# Patient Record
Sex: Female | Born: 1955 | Race: White | Hispanic: No | Smoking: Current every day smoker
Health system: Southern US, Community
[De-identification: ages and names within clinical notes are randomized; demographics above are authoritative.]

## PROBLEM LIST (undated history)

## (undated) DIAGNOSIS — Z1589 Genetic susceptibility to other disease: Secondary | ICD-10-CM

## (undated) DIAGNOSIS — K219 Gastro-esophageal reflux disease without esophagitis: Secondary | ICD-10-CM

## (undated) DIAGNOSIS — Z789 Other specified health status: Secondary | ICD-10-CM

## (undated) DIAGNOSIS — M1990 Unspecified osteoarthritis, unspecified site: Secondary | ICD-10-CM

## (undated) DIAGNOSIS — Z0282 Encounter for adoption services: Secondary | ICD-10-CM

## (undated) DIAGNOSIS — Z9221 Personal history of antineoplastic chemotherapy: Secondary | ICD-10-CM

## (undated) DIAGNOSIS — Z923 Personal history of irradiation: Secondary | ICD-10-CM

## (undated) DIAGNOSIS — Z1509 Genetic susceptibility to other malignant neoplasm: Secondary | ICD-10-CM

## (undated) DIAGNOSIS — Z1379 Encounter for other screening for genetic and chromosomal anomalies: Principal | ICD-10-CM

## (undated) DIAGNOSIS — C801 Malignant (primary) neoplasm, unspecified: Secondary | ICD-10-CM

## (undated) HISTORY — DX: Genetic susceptibility to other malignant neoplasm: Z15.09

## (undated) HISTORY — DX: Gastro-esophageal reflux disease without esophagitis: K21.9

## (undated) HISTORY — DX: Encounter for other screening for genetic and chromosomal anomalies: Z13.79

## (undated) HISTORY — DX: Encounter for adoption services: Z02.82

## (undated) HISTORY — PX: TUBAL LIGATION: SHX77

## (undated) HISTORY — DX: Other specified health status: Z78.9

## (undated) HISTORY — DX: Unspecified osteoarthritis, unspecified site: M19.90

## (undated) HISTORY — PX: TONSILLECTOMY: SUR1361

## (undated) HISTORY — DX: Genetic susceptibility to other disease: Z15.89

---

## 2001-09-22 ENCOUNTER — Emergency Department (HOSPITAL_COMMUNITY): Admission: EM | Admit: 2001-09-22 | Discharge: 2001-09-22 | Payer: Self-pay | Admitting: Emergency Medicine

## 2001-10-02 ENCOUNTER — Encounter: Admission: RE | Admit: 2001-10-02 | Discharge: 2001-10-02 | Payer: Self-pay | Admitting: Gastroenterology

## 2001-10-02 ENCOUNTER — Encounter: Payer: Self-pay | Admitting: Gastroenterology

## 2012-05-23 ENCOUNTER — Ambulatory Visit: Payer: Self-pay

## 2012-05-23 ENCOUNTER — Ambulatory Visit: Payer: Self-pay | Admitting: Family Medicine

## 2012-05-23 VITALS — BP 167/99 | HR 79 | Temp 98.0°F | Resp 18

## 2012-05-23 DIAGNOSIS — M79609 Pain in unspecified limb: Secondary | ICD-10-CM

## 2012-05-23 DIAGNOSIS — R05 Cough: Secondary | ICD-10-CM

## 2012-05-23 DIAGNOSIS — M79672 Pain in left foot: Secondary | ICD-10-CM

## 2012-05-23 DIAGNOSIS — M76829 Posterior tibial tendinitis, unspecified leg: Secondary | ICD-10-CM

## 2012-05-23 DIAGNOSIS — H9209 Otalgia, unspecified ear: Secondary | ICD-10-CM

## 2012-05-23 DIAGNOSIS — R030 Elevated blood-pressure reading, without diagnosis of hypertension: Secondary | ICD-10-CM

## 2012-05-23 DIAGNOSIS — J069 Acute upper respiratory infection, unspecified: Secondary | ICD-10-CM

## 2012-05-23 MED ORDER — AZITHROMYCIN 250 MG PO TABS: ORAL_TABLET | ORAL | Status: DC

## 2012-05-23 NOTE — Progress Notes (Signed)
Subjective:    Patient ID: Darlene Beasley, female    DOB: 1955/09/18, 55 y.o.   MRN: [id number]  HPI Darlene Beasley is a 56 y.o. female  Cold symptoms, with cough and congestion, green phlegm and nasal congestion, sore in R ear past 6 days since pressure of blowing nose.  Low grade fever in the morning only. Tx: mucinex DM, otc tylenol.    Swelling on top of L foot, NKI.  Swelling up and down for 2-3 months. 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 No pain meds for this. No history of stress fracture, or broken bones.  Hx of Rheumatoid arthritis - no recent meds.  Occasional pain in thumb.    No primary care provider.  Told blood pressure high at doctor or dentist in past, has lost 40 pounds since March with diet and activity.   Review of Systems  Constitutional: Positive for fever (low grade).  HENT: Positive for congestion.   Respiratory: Positive for cough. Negative for wheezing.   Musculoskeletal: Positive for arthralgias.  Psychiatric/Behavioral: Negative for sleep disturbance.  and as above.      Objective:   Physical Exam  Constitutional: She is oriented to person, place, and time. She appears well-developed and well-nourished. No distress.  HENT:  Head: Normocephalic and atraumatic.  Right Ear: Hearing, tympanic membrane, external ear and ear canal normal.  Left Ear: Hearing, tympanic membrane, external ear and ear canal normal.  Nose: Nose normal.  Mouth/Throat: Oropharynx is clear and moist. No oropharyngeal exudate.  Eyes: Conjunctivae normal and EOM are normal. Pupils are equal, round, and reactive to light.  Cardiovascular: Normal rate, regular rhythm, normal heart sounds and intact distal pulses.   No murmur heard. Pulmonary/Chest: Effort normal and breath sounds normal. No respiratory distress. She has no wheezes. She has no rhonchi.  Musculoskeletal:       Left foot:  She exhibits tenderness. She exhibits normal range of motion.       Feet:  Neurological: She is alert and oriented to person, place, and time.  Skin: Skin is warm and dry. No rash noted.  Psychiatric: She has a normal mood and affect. Her behavior is normal.     [facility] reading (PRIMARY) by  Dr. Neva Seat: L foot: NAD, no acute bony findings.        Assessment & Plan:   Darlene Beasley is a 56 y.o. female 1. Left foot pain  DG Foot Complete Left  2. Cough  azithromycin (ZITHROMAX) 250 MG tablet  3. URI (upper respiratory infection)    4. Otalgia    5. Elevated blood pressure    6. Tendinitis, tibialis     Cough, URI - possible early sinobronchitis. Continue mucinex DM, add saline NS, Zpak as above - can wait 1-2 days to fill if needed.   R otalgia - ETD with URi likely.  Saline nasal spray, tylenol otc prn, 2 days only of afrin if needed. rtc precautions.   L foot pain - Tibilais anterior tendonitis likely. discusssed overuse component to this - relative rest as able. strecthes BID, ice topically prn, otc alleve or ibuprofen as needed - only if BP lower outside of office - recheck in next 2 weeks. Discussed unlikely stress injury, but need for follow up reinforced.   Patient Instructions  You can continue the mucinex DM for your cough, add saline nasal spray -  4-5 times per day, and if needed for pressure and ear pain - Afrin nasal spray - up to 2 days use only. Take antibiotic as instructed.  Return to the clinic or go to the nearest emergency room if any of your symptoms worsen or new symptoms occur.  Keep a record of your blood pressures outside of the office and bring them to the next office visit. As long as they are running under 140/90 - you can take alleve or ibuprofen over the counter as needed for your foot pain (likely form Tibialis tendinitis).   Continue icing to top of foot, stretches throughout the day, and rest as able.  Recheck foot in the next 2 weeks - sooner if worse.

## 2012-05-23 NOTE — Patient Instructions (Signed)
You can continue the mucinex DM for your cough, add saline nasal spray - 4-5 times per day, and if needed for pressure and ear pain - Afrin nasal spray - up to 2 days use only. Take antibiotic as instructed.  Return to the clinic or go to the nearest emergency room if any of your symptoms worsen or new symptoms occur.  Keep a record of your blood pressures outside of the office and bring them to the next office visit. As long as they are running under 140/90 - you can take alleve or ibuprofen over the counter as needed for your foot pain (likely form Tibialis tendinitis).   Continue icing to top of foot, stretches throughout the day, and rest as able.  Recheck foot in the next 2 weeks - sooner if worse.

## 2012-06-22 ENCOUNTER — Ambulatory Visit: Payer: Self-pay | Admitting: Family Medicine

## 2013-06-04 ENCOUNTER — Ambulatory Visit: Payer: Self-pay | Admitting: Family Medicine

## 2013-06-04 VITALS — BP 120/76 | HR 76 | Temp 99.0°F | Resp 16 | Ht 63.5 in | Wt 198.0 lb

## 2013-06-04 DIAGNOSIS — R110 Nausea: Secondary | ICD-10-CM

## 2013-06-04 DIAGNOSIS — R109 Unspecified abdominal pain: Secondary | ICD-10-CM

## 2013-06-04 DIAGNOSIS — K529 Noninfective gastroenteritis and colitis, unspecified: Secondary | ICD-10-CM

## 2013-06-04 DIAGNOSIS — R1012 Left upper quadrant pain: Secondary | ICD-10-CM

## 2013-06-04 DIAGNOSIS — R6889 Other general symptoms and signs: Secondary | ICD-10-CM

## 2013-06-04 DIAGNOSIS — K5289 Other specified noninfective gastroenteritis and colitis: Secondary | ICD-10-CM

## 2013-06-04 DIAGNOSIS — E860 Dehydration: Secondary | ICD-10-CM

## 2013-06-04 LAB — POCT URINALYSIS DIPSTICK
Glucose, UA: NEGATIVE
Ketones, UA: 15
Leukocytes, UA: NEGATIVE
Nitrite, UA: NEGATIVE
Protein, UA: 100
Spec Grav, UA: >=1.03
Urobilinogen, UA: 0.2

## 2013-06-04 LAB — POCT CBC
Granulocyte percent: 70.6 %G (ref 37–80)
Hemoglobin: 15.8 g/dL (ref 12.2–16.2)
Lymph, poc: 1.6 (ref 0.6–3.4)
MCH, POC: 30.3 pg (ref 27–31.2)
MCV: 97 fL (ref 80–97)
MPV: 9.9 fL (ref 0–99.8)
POC Granulocyte: 5.2 (ref 2–6.9)
POC LYMPH PERCENT: 21.4 %L (ref 10–50)
POC MID %: 8 %M (ref 0–12)
Platelet Count, POC: 209 10*3/uL (ref 142–424)
RBC: 5.21 M/uL (ref 4.04–5.48)
RDW, POC: 14.1 %
WBC: 7.3 10*3/uL (ref 4.6–10.2)

## 2013-06-04 LAB — IFOBT (OCCULT BLOOD): IFOBT: NEGATIVE

## 2013-06-04 MED ORDER — ONDANSETRON 4 MG PO TBDP
8.0000 mg | ORAL_TABLET | Freq: Once | ORAL | Status: AC
  Administered 2013-06-04: 8 mg via ORAL

## 2013-06-04 MED ORDER — PROMETHAZINE HCL 25 MG PO TABS: 25.0000 mg | ORAL_TABLET | Freq: Three times a day (TID) | ORAL | Status: DC | PRN

## 2013-06-04 NOTE — Patient Instructions (Signed)
Dehydration, Adult Dehydration is when you lose more fluids from the body than you take in. Vital organs like the kidneys, brain, and heart cannot function without a proper amount of fluids and salt. Any loss of fluids from the body can cause dehydration.  CAUSES   Vomiting.  Diarrhea.  Excessive sweating.  Excessive urine output.  Fever. SYMPTOMS  Mild dehydration  Thirst.  Dry lips.  Slightly dry mouth. Moderate dehydration  Very dry mouth.  Sunken eyes.  Skin does not bounce back quickly when lightly pinched and released.  Dark urine and decreased urine production.  Decreased tear production.  Headache. Severe dehydration  Very dry mouth.  Extreme thirst.  Rapid, weak pulse (more than 100 beats per minute at rest).  Cold hands and feet.  Not able to sweat in spite of heat and temperature.  Rapid breathing.  Blue lips.  Confusion and lethargy.  Difficulty being awakened.  Minimal urine production.  No tears. DIAGNOSIS  Your caregiver will diagnose dehydration based on your symptoms and your exam. Blood and urine tests will help confirm the diagnosis. The diagnostic evaluation should also identify the cause of dehydration. TREATMENT  Treatment of mild or moderate dehydration can often be done at home by increasing the amount of fluids that you drink. It is best to drink small amounts of fluid more often. Drinking too much at one time can make vomiting worse. Refer to the home care instructions below. Severe dehydration needs to be treated at the hospital where you will probably be given intravenous (IV) fluids that contain water and electrolytes. HOME CARE INSTRUCTIONS   Ask your caregiver about specific rehydration instructions.  Drink enough fluids to keep your urine clear or pale yellow.  Drink small amounts frequently if you have nausea and vomiting.  Eat as you normally do.  Avoid:  Foods or drinks high in sugar.  Carbonated  drinks.  Juice.  Extremely hot or cold fluids.  Drinks with caffeine.  Fatty, greasy foods.  Alcohol.  Tobacco.  Overeating.  Gelatin desserts.  Wash your hands well to avoid spreading bacteria and viruses.  Only take over-the-counter or prescription medicines for pain, discomfort, or fever as directed by your caregiver.  Ask your caregiver if you should continue all prescribed and over-the-counter medicines.  Keep all follow-up appointments with your caregiver. SEEK MEDICAL CARE IF:  You have abdominal pain and it increases or stays in one area (localizes).  You have a rash, stiff neck, or severe headache.  You are irritable, sleepy, or difficult to awaken.  You are weak, dizzy, or extremely thirsty. SEEK IMMEDIATE MEDICAL CARE IF:   You are unable to keep fluids down or you get worse despite treatment.  You have frequent episodes of vomiting or diarrhea.  You have blood or green matter (bile) in your vomit.  You have blood in your stool or your stool looks black and tarry.  You have not urinated in 6 to 8 hours, or you have only urinated a small amount of very dark urine.  You have a fever.  You faint. MAKE SURE YOU:   Understand these instructions.  Will watch your condition.  Will get help right away if you are not doing well or get worse. Document Released: 05/27/2005 Document Revised: 08/19/2011 Document Reviewed: 01/14/2011 [employer] Patient Information 2014 [employer].  

## 2013-06-04 NOTE — Progress Notes (Signed)
Subjective:    Patient ID: Darlene Beasley, female    DOB: April 07, 1956, 57 y.o.   MRN: [id number] Chief Complaint  Patient presents with  . 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 Unsure if exposed to anyone with flu.  Chills started Sunday and felt like "blah" until weds., had high fever and states that it broke today at 100.6. Has not had headache. Vomited last night, and has loss of appetite due to smell and taste of food. Has increased fluids today, more so then she has in the past 3 days . Has some abdominal pain last night; which patient believes was from lack of food. Is having normal output and bowel movements. Productive cough with clear phlegm; notices more when laying down. Is having clear drainnage from nose. Does not currently feel nauseated, has some chicken broth today.  Took mucinex and several tylenol over the past 24 hours .  Past Medical History  Diagnosis Date  . Arthritis   . GERD (gastroesophageal reflux disease)    No current outpatient prescriptions on file prior to visit.   No current facility-administered medications on file prior to visit.   No Known Allergies   Review of Systems  Constitutional: Positive for fever, chills, diaphoresis, activity change, appetite change and fatigue.  HENT: Positive for congestion, postnasal drip and rhinorrhea. Negative for sinus pressure.   Respiratory: Positive for cough. Negative for shortness of breath and wheezing.   Cardiovascular: Negative for chest pain.  Gastrointestinal: Positive for nausea, vomiting and abdominal pain. Negative for diarrhea and constipation.  Genitourinary: Negative for frequency and decreased urine volume.  Neurological: Negative for headaches.  Hematological: Negative for adenopathy.  Psychiatric/Behavioral: Positive for sleep disturbance.      BP 120/76  Pulse 76   Temp(Src) 99 F (37.2 C) (Oral)  Resp 16  Ht 5' 3.5" (1.613 m)  Wt 198 lb (89.812 kg)  BMI 34.52 kg/m2  SpO2 98%  Objective:   Physical Exam  Constitutional: She is oriented to person, place, and time. She appears well-developed and well-nourished. No distress.  HENT:  Head: Normocephalic and atraumatic.  Right Ear: Tympanic membrane, external ear and ear canal normal.  Left Ear: Tympanic membrane, external ear and ear canal normal.  Nose: Nose normal. No mucosal edema or rhinorrhea.  Mouth/Throat: Uvula is midline, oropharynx is clear and moist and mucous membranes are normal. No oropharyngeal exudate.  Eyes: Conjunctivae are normal. Right eye exhibits no discharge. Left eye exhibits no discharge. No scleral icterus.  Neck: Normal range of motion. Neck supple.  Cardiovascular: Normal rate, regular rhythm, normal heart sounds and intact distal pulses.   Pulmonary/Chest: Effort normal and breath sounds normal.  Abdominal: Soft. Bowel sounds are normal. She exhibits no distension and no mass. There is no tenderness. There is no rebound and no guarding.  Genitourinary: Rectum normal. Rectal exam shows no mass and anal tone normal. Guaiac negative stool.  Lymphadenopathy:    She has no cervical adenopathy.  Neurological: She is alert and oriented to person, place, and time.  Skin: Skin is warm and dry. She is not diaphoretic. No erythema.  Psychiatric: She has a normal mood and affect. Her behavior is normal.      Results for orders placed in visit on 06/04/13  POCT CBC      Result Value Range  WBC 7.3  4.6 - 10.2 K/uL   Lymph, poc 1.6  0.6 - 3.4   POC LYMPH PERCENT 21.4  10 - 50 %L   MID (cbc) 0.6  0 - 0.9   POC MID % 8.0  0 - 12 %M   POC Granulocyte 5.2  2 - 6.9   Granulocyte percent 70.6  37 - 80 %G   RBC 5.21  4.04 - 5.48 M/uL   Hemoglobin 15.8  12.2 - 16.2 g/dL   HCT, POC 78.2 (*) 95.6 - 47.9 %   MCV 97.0  80 - 97 fL   MCH, POC 30.3  27 - 31.2 pg   MCHC 31.3 (*) 31.8 -  35.4 g/dL   RDW, POC 21.3     Platelet Count, POC 209  142 - 424 K/uL   MPV 9.9  0 - 99.8 fL  POCT URINALYSIS DIPSTICK      Result Value Range   Color, UA yellow     Clarity, UA clear     Glucose, UA neg     Bilirubin, UA small     Ketones, UA 15     Spec Grav, UA >=1.030     Blood, UA trace-lysed     pH, UA 5.5     Protein, UA 100     Urobilinogen, UA 0.2     Nitrite, UA neg     Leukocytes, UA Negative    IFOBT (OCCULT BLOOD)      Result Value Range   IFOBT Negative     Assessment & Plan:   Abdominal pain - Plan: POCT CBC, POCT urinalysis dipstick, IFOBT POC (occult bld, rslt in office), ondansetron (ZOFRAN-ODT) disintegrating tablet 8 mg, CANCELED: POCT UA - Microscopic Only  Nausea - Plan: ondansetron (ZOFRAN-ODT) disintegrating tablet 8 mg  Noninfectious gastroenteritis and colitis  Flu-like symptoms  Dehydration  Viral syndrome due to diffuse systemic invovlement and nml CBC.  Highly suspect flu but will not test as sxs began a week ago so WELL outside of treatment range and unlikely to even be shedding much virus still so neg test not helpful.  I think she will recover on her own - is already starting to improve some - just needs to continue symptomatic care and make sure she doesn't get dehydrated - by her UA she is already dehydrated which is prob making sxs much worse. Push fluids, oow, self-quarentine, hand hygiene. RTC if worsening. Meds ordered this encounter  Medications  . ondansetron (ZOFRAN-ODT) disintegrating tablet 8 mg    Sig:   . promethazine (PHENERGAN) 25 MG tablet    Sig: Take 1 tablet (25 mg total) by mouth every 8 (eight) hours as needed for nausea or vomiting.    Dispense:  20 tablet    Refill:  0   Norberto Sorenson, MD MPH

## 2013-06-05 ENCOUNTER — Telehealth: Payer: Self-pay

## 2013-06-05 NOTE — Telephone Encounter (Signed)
Spoke to pt. She is still feeling very ill. Her fever has come back and remains around 100.0. She wants a note until Jan 2. Spoke to Dr. Clelia Croft she has agreed. Letter at front desk for pt to pick up

## 2013-06-05 NOTE — Telephone Encounter (Signed)
PATIENT IS CALLING TO SPEAK TO DR SHAW ABOUT GETTING AN EXTENTION ON HER WORK NOTE IF POSSIBLE PLEASE CALL [phone]

## 2013-06-13 ENCOUNTER — Ambulatory Visit: Payer: Self-pay | Admitting: Emergency Medicine

## 2013-06-13 ENCOUNTER — Ambulatory Visit: Payer: Self-pay

## 2013-06-13 VITALS — BP 130/80 | HR 85 | Temp 99.3°F | Resp 16 | Ht 65.0 in | Wt 194.0 lb

## 2013-06-13 DIAGNOSIS — R509 Fever, unspecified: Secondary | ICD-10-CM

## 2013-06-13 DIAGNOSIS — R05 Cough: Secondary | ICD-10-CM

## 2013-06-13 DIAGNOSIS — R059 Cough, unspecified: Secondary | ICD-10-CM

## 2013-06-13 DIAGNOSIS — J189 Pneumonia, unspecified organism: Secondary | ICD-10-CM

## 2013-06-13 LAB — POCT CBC
Granulocyte percent: 76 %G (ref 37–80)
HCT, POC: 46 % (ref 37.7–47.9)
Hemoglobin: 14.7 g/dL (ref 12.2–16.2)
Lymph, poc: 2.8 (ref 0.6–3.4)
MCH, POC: 30.3 pg (ref 27–31.2)
MCHC: 32 g/dL (ref 31.8–35.4)
MCV: 94.8 fL (ref 80–97)
MID (cbc): 0.9 (ref 0–0.9)
MPV: 10 fL (ref 0–99.8)
POC Granulocyte: 11.7 — AB (ref 2–6.9)
POC LYMPH PERCENT: 18.1 %L (ref 10–50)
POC MID %: 5.9 %M (ref 0–12)
Platelet Count, POC: 309 10*3/uL (ref 142–424)
RBC: 4.85 M/uL (ref 4.04–5.48)
RDW, POC: 13.4 %
WBC: 15.4 10*3/uL — AB (ref 4.6–10.2)

## 2013-06-13 MED ORDER — CEFTRIAXONE SODIUM 1 G IJ SOLR
1.0000 g | Freq: Once | INTRAMUSCULAR | Status: AC
  Administered 2013-06-13: 1 g via INTRAMUSCULAR

## 2013-06-13 MED ORDER — LEVOFLOXACIN 500 MG PO TABS
500.0000 mg | ORAL_TABLET | Freq: Every day | ORAL | Status: DC
Start: 2013-06-13 — End: 2013-09-08

## 2013-06-13 NOTE — Progress Notes (Signed)
   Subjective:    Patient ID: Darlene Beasley, female    DOB: December 17, 1955, 58 y.o.   MRN: [id number]  HPI 58 year old female presents for recheck of fever, cough, and shortness of breath. She was seen here on 12/26 and diagnosed with probably flu and gastroenteritis.  Was prescribed phenergan to take for nausea. States her symptoms did improve and her fever broke the following week, however on 1/2 her fever returned and has continued to be 99-100.0.    Review of Systems  Constitutional: Positive for fever, chills and fatigue.  HENT: Negative for congestion, ear pain and sore throat.   Respiratory: Positive for cough (productive) and shortness of breath. Negative for chest tightness and wheezing.   Gastrointestinal: Negative for nausea, vomiting and abdominal pain.  Neurological: Negative for dizziness and headaches.       Objective:   Physical Exam  Constitutional: She is oriented to person, place, and time. She appears well-developed and well-nourished.  HENT:  Head: Normocephalic and atraumatic.  Right Ear: Hearing, tympanic membrane, external ear and ear canal normal.  Left Ear: Hearing, tympanic membrane, external ear and ear canal normal.  Mouth/Throat: Uvula is midline, oropharynx is clear and moist and mucous membranes are normal. No oropharyngeal exudate.  Eyes: Conjunctivae are normal.  Neck: Normal range of motion. Neck supple.  Cardiovascular: Normal rate, regular rhythm and normal heart sounds.   Pulmonary/Chest: Effort normal. She has no wheezes. She has rales (left lower fields).  Lymphadenopathy:    She has no cervical adenopathy.  Neurological: She is alert and oriented to person, place, and time.  Psychiatric: She has a normal mood and affect. Her behavior is normal. Judgment and thought content normal.     [facility] reading (PRIMARY) by  Dr. Everlene Farrier as increased markings on right and left lower lobe infiltrate seen on lateral view.       Assessment & Plan:    Pneumonia, organism unspecified - Plan: cefTRIAXone (ROCEPHIN) injection 1 g, levofloxacin (LEVAQUIN) 500 MG tablet  Cough - Plan: DG Chest 2 View, POCT CBC  Fever, unspecified - Plan: DG Chest 2 View  Rocephin 1 gram given today Start Levaquin 500 mg daily x 7 days Out of work today.  Recheck in 48-72 hours if symptoms not significantly improved, sooner if worse

## 2013-09-08 ENCOUNTER — Ambulatory Visit: Payer: Self-pay

## 2013-09-08 ENCOUNTER — Ambulatory Visit: Payer: Self-pay | Admitting: Family Medicine

## 2013-09-08 VITALS — BP 158/102 | HR 88 | Temp 98.5°F | Resp 16 | Ht 64.5 in | Wt 201.0 lb

## 2013-09-08 DIAGNOSIS — R059 Cough, unspecified: Secondary | ICD-10-CM

## 2013-09-08 DIAGNOSIS — R05 Cough: Secondary | ICD-10-CM

## 2013-09-08 DIAGNOSIS — J209 Acute bronchitis, unspecified: Secondary | ICD-10-CM

## 2013-09-08 DIAGNOSIS — R509 Fever, unspecified: Secondary | ICD-10-CM

## 2013-09-08 LAB — POCT CBC
GRANULOCYTE PERCENT: 56.4 % (ref 37–80)
HCT, POC: 45.8 % (ref 37.7–47.9)
HEMOGLOBIN: 14.6 g/dL (ref 12.2–16.2)
Lymph, poc: 1.7 (ref 0.6–3.4)
MCH: 30 pg (ref 27–31.2)
MCHC: 31.9 g/dL (ref 31.8–35.4)
MCV: 94.2 fL (ref 80–97)
MID (CBC): 0.4 (ref 0–0.9)
MPV: 8.9 fL (ref 0–99.8)
PLATELET COUNT, POC: 237 10*3/uL (ref 142–424)
POC Granulocyte: 2.8 (ref 2–6.9)
POC LYMPH PERCENT: 34.5 %L (ref 10–50)
POC MID %: 9.1 %M (ref 0–12)
RBC: 4.86 M/uL (ref 4.04–5.48)
RDW, POC: 14.8 %
WBC: 4.9 10*3/uL (ref 4.6–10.2)

## 2013-09-08 MED ORDER — AZITHROMYCIN 250 MG PO TABS: ORAL_TABLET | ORAL | Status: DC

## 2013-09-08 NOTE — Progress Notes (Addendum)
Subjective:  This chart was scribed for [name], MD by Ludger Nutting, ED Scribe. This patient was seen in room 14 and the patient's care was started 4:50 PM.    Patient ID: Darlene Beasley, female    DOB: [birth year], 58 y.o.   MRN: [id number]  HPI HPI Comments: Darlene Beasley is a 59 y.o. female who presents to [facility] complaining of 1 day of gradual onset, constant HA that progressed to associated chills, subjective mild fever, and non-productive cough. She reports having associated eye watering and rhinorrhea after severe coughing. She states her PO intake has been normal. She has taken Mucinex with minimal relief. She denies sore throat, otalgia, emesis, diarrhea. She reports history of flu in December 2014 and pneumonia in January 2015.  She is a regular smoker since age 89.   Past Medical History  Diagnosis Date  . Arthritis   . GERD (gastroesophageal reflux disease)     Past Surgical History  Procedure Laterality Date  . Tubal ligation       No Known Allergies No current outpatient prescriptions on file prior to visit.   No current facility-administered medications on file prior to visit.   History   Social History  . Marital Status: Divorced    Spouse Name: N/A    Number of Children: N/A  . Years of Education: N/A   Occupational History  . Not on file.   Social History Main Topics  . Smoking status: Current Every Day Smoker -- 0.50 packs/day    Types: Cigarettes  . Smokeless tobacco: Never Used  . Alcohol Use: No  . Drug Use: No  . Sexual Activity: Not on file   Other Topics Concern  . Not on file   Social History Narrative  . No narrative on file    Review of Systems  Constitutional: Positive for chills. Fever: subjective.  HENT: Positive for rhinorrhea. Negative for congestion, ear pain, postnasal drip, sore throat, trouble swallowing and voice change.   Respiratory: Positive for cough. Negative for shortness of breath and wheezing.     Gastrointestinal: Negative for vomiting, abdominal pain and diarrhea.  Skin: Negative for rash.  Neurological: Negative for headaches.       Objective:   Physical Exam  Nursing note and vitals reviewed. Constitutional: She is oriented to person, place, and time. She appears well-developed and well-nourished. No distress.  HENT:  Head: Normocephalic and atraumatic.  Right Ear: Tympanic membrane, external ear and ear canal normal.  Left Ear: Tympanic membrane, external ear and ear canal normal.  Nose: Nose normal.  Mouth/Throat: Oropharynx is clear and moist. No oropharyngeal exudate, posterior oropharyngeal edema, posterior oropharyngeal erythema or tonsillar abscesses.  Eyes: Conjunctivae are normal. Pupils are equal, round, and reactive to light.  Neck: Normal range of motion. Neck supple.  Cardiovascular: Normal rate, regular rhythm and normal heart sounds.   No murmur heard. Pulmonary/Chest: Effort normal and breath sounds normal. No respiratory distress. She has no wheezes. She has no rhonchi. She has no rales.  Abdominal: She exhibits no distension.  Lymphadenopathy:    She has no cervical adenopathy.  Neurological: She is alert and oriented to person, place, and time.  Skin: Skin is warm and dry. She is not diaphoretic.  Psychiatric: She has a normal mood and affect. Her behavior is normal.    Filed Vitals:   09/08/13 1600  BP: 158/102  Pulse: 88  Temp: 98.5 F (36.9 C)  TempSrc: Oral  Resp: 16  Height: 5' 4.5" (1.638 m)  Weight: 201 lb (91.173 kg)  SpO2: 94%   Results for orders placed in visit on 09/08/13  POCT CBC      Result Value Ref Range   WBC 4.9  4.6 - 10.2 K/uL   Lymph, poc 1.7  0.6 - 3.4   POC LYMPH PERCENT 34.5  10 - 50 %L   MID (cbc) 0.4  0 - 0.9   POC MID % 9.1  0 - 12 %M   POC Granulocyte 2.8  2 - 6.9   Granulocyte percent 56.4  37 - 80 %G   RBC 4.86  4.04 - 5.48 M/uL   Hemoglobin 14.6  12.2 - 16.2 g/dL   HCT, POC 45.8  37.7 - 47.9 %   MCV  94.2  80 - 97 fL   MCH, POC 30.0  27 - 31.2 pg   MCHC 31.9  31.8 - 35.4 g/dL   RDW, POC 14.8     Platelet Count, POC 237  142 - 424 K/uL   MPV 8.9  0 - 99.8 fL   [facility] reading (PRIMARY) by  Dr. Tamala Julian.  CXR: stable scarring in LLL.      Assessment & Plan:   1. Cough   2. Fever   3. Acute bronchitis    1. Acute bronchitis:  New. Rx for Zpack provided; continue Mucinex, ASA.  RTC for acute worsening.  Meds ordered this encounter  Medications  . azithromycin (ZITHROMAX) 250 MG tablet    Sig: Two tablets daily x 1 day then one tablet daily x 4 days    Dispense:  6 tablet    Refill:  0    I personally performed the services described in this documentation, which was scribed in my presence.  The recorded information has been reviewed and is accurate.  Reginia Forts, M.D.  [facility] [address] [place], [place]  [address] [phone] phone [phone] fax

## 2013-09-08 NOTE — Patient Instructions (Signed)
Acute Bronchitis Bronchitis is inflammation of the airways that extend from the windpipe into the lungs (bronchi). The inflammation often causes mucus to develop. This leads to a cough, which is the most common symptom of bronchitis.  In acute bronchitis, the condition usually develops suddenly and goes away over time, usually in a couple weeks. Smoking, allergies, and asthma can make bronchitis worse. Repeated episodes of bronchitis may cause further lung problems.  CAUSES Acute bronchitis is most often caused by the same virus that causes a cold. The virus can spread from person to person (contagious).  SIGNS AND SYMPTOMS   Cough.   Fever.   Coughing up mucus.   Body aches.   Chest congestion.   Chills.   Shortness of breath.   Sore throat.  DIAGNOSIS  Acute bronchitis is usually diagnosed through a physical exam. Tests, such as chest X-rays, are sometimes done to rule out other conditions.  TREATMENT  Acute bronchitis usually goes away in a couple weeks. Often times, no medical treatment is necessary. Medicines are sometimes given for relief of fever or cough. Antibiotics are usually not needed but may be prescribed in certain situations. In some cases, an inhaler may be recommended to help reduce shortness of breath and control the cough. A cool mist vaporizer may also be used to help thin bronchial secretions and make it easier to clear the chest.  HOME CARE INSTRUCTIONS  Get plenty of rest.   Drink enough fluids to keep your urine clear or pale yellow (unless you have a medical condition that requires fluid restriction). Increasing fluids may help thin your secretions and will prevent dehydration.   Only take over-the-counter or prescription medicines as directed by your health care provider.   Avoid smoking and secondhand smoke. Exposure to cigarette smoke or irritating chemicals will make bronchitis worse. If you are a smoker, consider using nicotine gum or skin  patches to help control withdrawal symptoms. Quitting smoking will help your lungs heal faster.   Reduce the chances of another bout of acute bronchitis by washing your hands frequently, avoiding people with cold symptoms, and trying not to touch your hands to your mouth, nose, or eyes.   Follow up with your health care provider as directed.  SEEK MEDICAL CARE IF: Your symptoms do not improve after 1 week of treatment.  SEEK IMMEDIATE MEDICAL CARE IF:  You develop an increased fever or chills.   You have chest pain.   You have severe shortness of breath.  You have bloody sputum.   You develop dehydration.  You develop fainting.  You develop repeated vomiting.  You develop a severe headache. MAKE SURE YOU:   Understand these instructions.  Will watch your condition.  Will get help right away if you are not doing well or get worse. Document Released: 07/04/2004 Document Revised: 01/27/2013 Document Reviewed: 11/17/2012 [employer] Patient Information 2014 [employer].  

## 2015-04-05 ENCOUNTER — Ambulatory Visit (INDEPENDENT_AMBULATORY_CARE_PROVIDER_SITE_OTHER): Payer: Self-pay | Admitting: Physician Assistant

## 2015-04-05 VITALS — BP 136/82 | HR 89 | Temp 98.4°F | Resp 18 | Ht 64.0 in | Wt 192.0 lb

## 2015-04-05 DIAGNOSIS — J019 Acute sinusitis, unspecified: Secondary | ICD-10-CM

## 2015-04-05 MED ORDER — AMOXICILLIN-POT CLAVULANATE 875-125 MG PO TABS: 1.0000 | ORAL_TABLET | Freq: Two times a day (BID) | ORAL | Status: AC

## 2015-04-05 NOTE — Patient Instructions (Signed)
Please restart the Mucinex to help thin the mucous. Get as much rest as you can, and drink as much water as you can.

## 2015-04-05 NOTE — Progress Notes (Signed)
Patient ID: Darlene Beasley, female    DOB: 1955/10/28, 59 y.o.   MRN: [id number]  PCP: No PCP Per Patient  Subjective:   Chief Complaint  Patient presents with  . Lymphadenopathy    x3 weeks   . chest congestion  . Cough  . Fatigue  . Fever    102 monday   . Sore Throat    HPI Presents for evaluation of cough, sore throat, and congestion x 3 weeks.   Cough started out as non-productive and was associated with low-grade subjective fevers at night and nasal congestion. Cough became productive about 2 weeks ago with yellow-tinged sputum and she reports a fever of 102 on Monday AM. Since that time, her fever has broken and her cough and congestion have improved, but she now complains of neck stiffness and swelling over the last few days. She tried Mucinex for 2 weeks, which helped thin out her secretions, but she stopped taking it after hearing she shouldn't take it long-term.   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 Her energy levels are ok, but she reports the last couple of days she has felt particularly tired and fatigued. She feels stressed out because she's supposed to start back at work tomorrow.  Patient not interested in completing her overdue health maintenance measures at this visit. No other concern's on today's visit.   Review of Systems Constitutional: Positive for fever, chills (at night), appetite change (eating mostly soups; too tired to eat), fatigue and unexpected weight change (has lost approx 8 pounds in the last 3 weeks).  HENT: Positive for congestion, postnasal drip, rhinorrhea and sore throat. Negative for ear discharge, ear pain, hearing loss, sinus pressure and tinnitus.  Eyes: Negative for pain, redness, itching and visual disturbance.  Respiratory: Positive for cough, chest tightness and shortness of breath (mild; has improved over the last week). Negative for wheezing.  Cardiovascular: Negative  for chest pain and palpitations.  Gastrointestinal: Positive for constipation (no BM in last 2 days, but not eating much). Negative for nausea, vomiting, abdominal pain, diarrhea and blood in stool.  Genitourinary: Negative for hematuria, decreased urine volume and difficulty urinating.  Musculoskeletal: Positive for neck stiffness. Negative for myalgias, back pain, joint swelling and arthralgias.  Skin: Negative.  Allergic/Immunologic: Negative for environmental allergies.  Neurological: Negative for dizziness, weakness, light-headedness and headaches.      There are no active problems to display for this patient.    Prior to Admission medications   Medication Sig Start Date End Date Taking? Authorizing Provider  aspirin 81 MG tablet Take 81 mg by mouth daily.   Yes Historical Provider, MD  dextromethorphan-guaiFENesin (MUCINEX DM) 30-600 MG 12hr tablet Take 1 tablet by mouth 2 (two) times daily.   Yes Historical Provider, MD     No Known Allergies     Objective:  Physical Exam  Constitutional: She is oriented to person, place, and time. Vital signs are normal. She appears well-developed and well-nourished. She is active and cooperative. No distress.  BP 136/82 mmHg  Pulse 89  Temp(Src) 98.4 F (36.9 C) (Oral)  Resp 18  Ht 5\' 4"  (1.626 m)  Wt 192 lb (87.091 kg)  BMI 32.94 kg/m2  SpO2 98%  HENT:  Head: Normocephalic and atraumatic.  Right Ear: Hearing, tympanic membrane, external ear and ear canal normal.  Left Ear: Hearing, tympanic membrane, external ear and ear canal normal.  Nose: Nose  normal. Right sinus exhibits no maxillary sinus tenderness and no frontal sinus tenderness. Left sinus exhibits no maxillary sinus tenderness and no frontal sinus tenderness.  Mouth/Throat: Uvula is midline, oropharynx is clear and moist and mucous membranes are normal.  Eyes: Conjunctivae are normal. No scleral icterus.  Neck: Normal range of motion and phonation normal. Neck supple. No  thyromegaly present.  Cardiovascular: Normal rate, regular rhythm and normal heart sounds.   Pulses:      Radial pulses are 2+ on the right side, and 2+ on the left side.  Pulmonary/Chest: Effort normal and breath sounds normal.  Lymphadenopathy:       Head (right side): No preauricular, no posterior auricular and no occipital adenopathy present.       Head (left side): No preauricular, no posterior auricular and no occipital adenopathy present.    She has no cervical adenopathy.       Right: No supraclavicular adenopathy present.       Left: No supraclavicular adenopathy present.  Bilateral fullness in the tonsillar region, tender. No discrete lymphadenopathy.  Neurological: She is alert and oriented to person, place, and time. No sensory deficit.  Skin: Skin is warm, dry and intact. No rash noted. No cyanosis or erythema. Nails show no clubbing.  Psychiatric: She has a normal mood and affect.           Assessment & Plan:   1. Subacute sinusitis, unspecified location Rest, fluids, guaifenesin. Quit smoking. RTC if symptoms worsen/persist. - amoxicillin-clavulanate (AUGMENTIN) 875-125 MG tablet; Take 1 tablet by mouth 2 (two) times daily.  Dispense: 20 tablet; Refill: 0   Fara Chute, PA-C Physician Assistant-Certified [facility]

## 2015-04-05 NOTE — Progress Notes (Signed)
Subjective:    Patient ID: Darlene Beasley, female    DOB: 11-29-1955, 59 y.o.   MRN: [id number]  Chief Complaint  Patient presents with  . Lymphadenopathy    x3 weeks   . chest congestion  . Cough  . Fatigue  . Fever    102 monday   . Sore Throat   HPI Patient presents today for evaluation of cough, sore throat, and congestion x 3 weeks. Cough started out as non-productive and was associated with low-grade subjective fevers at night and nasal congestion. Cough became productive about 2 weeks ago with yellow-tinged sputum and she reports a fever of 102 on Monday AM. Since that time, her fever has broken and her cough and congestion have improved, but she now complains of neck stiffness and swelling over the last few days. She tried Mucinex for 2 weeks, which helped thin out her secretions, but she stopped taking it after hearing she shouldn't take it long-term.   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 Her energy levels are ok, but she reports the last couple of days she has felt particularly tired and fatigued. She feels stressed out because she's supposed to start back at work tomorrow.   Patient not interested in completing her overdue health maintenance measures at this visit. No other concern's on today's visit.    Review of Systems  Constitutional: Positive for fever, chills (at night), appetite change (eating mostly soups; too tired to eat), fatigue and unexpected weight change (has lost approx 8 pounds in the last 3 weeks).  HENT: Positive for congestion, postnasal drip, rhinorrhea and sore throat. Negative for ear discharge, ear pain, hearing loss, sinus pressure and tinnitus.   Eyes: Negative for pain, redness, itching and visual disturbance.  Respiratory: Positive for cough, chest tightness and shortness of breath (mild; has improved over the last week). Negative for wheezing.   Cardiovascular: Negative for chest  pain and palpitations.  Gastrointestinal: Positive for constipation (no BM in last 2 days, but not eating much). Negative for nausea, vomiting, abdominal pain, diarrhea and blood in stool.  Genitourinary: Negative for hematuria, decreased urine volume and difficulty urinating.  Musculoskeletal: Positive for neck stiffness. Negative for myalgias, back pain, joint swelling and arthralgias.  Skin: Negative.   Allergic/Immunologic: Negative for environmental allergies.  Neurological: Negative for dizziness, weakness, light-headedness and headaches.   There are no active problems to display for this patient.  Family History  Problem Relation Age of Onset  . Congestive Heart Failure Mother    Social History   Social History  . Marital Status: Divorced    Spouse Name: N/A  . Number of Children: N/A  . Years of Education: N/A   Occupational History  . Not on file.   Social History Main Topics  . Smoking status: Current Every Day Smoker -- 0.50 packs/day    Types: Cigarettes  . Smokeless tobacco: Never Used  . Alcohol Use: No  . Drug Use: No  . Sexual Activity: Not on file   Other Topics Concern  . Not on file   Social History Narrative   Prior to Admission medications   Medication Sig Start Date End Date Taking? Authorizing Provider  aspirin 81 MG tablet Take 81 mg by mouth daily.   Yes Historical Provider, MD  dextromethorphan-guaiFENesin (MUCINEX DM) 30-600 MG 12hr tablet Take 1 tablet by mouth 2 (two) times daily.   Yes Historical Provider,  MD  amoxicillin-clavulanate (AUGMENTIN) 875-125 MG tablet Take 1 tablet by mouth 2 (two) times daily. 04/05/15 04/15/15  Chelle Jeffery, PA-C   No Known Allergies    Objective:   Physical Exam  Constitutional: She is oriented to person, place, and time. She appears well-developed and well-nourished. No distress.  BP 136/82 mmHg  Pulse 89  Temp(Src) 98.4 F (36.9 C) (Oral)  Resp 18  Ht 5\' 4"  (1.626 m)  Wt 192 lb (87.091 kg)  BMI  32.94 kg/m2  SpO2 98%  HENT:  Head: Normocephalic and atraumatic.  Eyes: EOM are normal. No scleral icterus.  Neck: No thyromegaly present.  Exhibits mild neck fullness  Cardiovascular: Normal rate, regular rhythm, normal heart sounds and intact distal pulses.  Exam reveals no gallop and no friction rub.   No murmur heard. Pulmonary/Chest: Effort normal and breath sounds normal. No respiratory distress. She has no wheezes. She has no rales.  Musculoskeletal: She exhibits no edema.  Lymphadenopathy:    She has no cervical adenopathy (anterior cervical LAD).  Neurological: She is alert and oriented to person, place, and time.  Skin: Skin is warm and dry. No rash noted. She is not diaphoretic. No erythema.  Psychiatric: She has a normal mood and affect. Her behavior is normal. Judgment and thought content normal.      Assessment & Plan:  1. Subacute sinusitis, unspecified location - Patient may return to work on Friday. Instructed patient to focus on getting plenty of rest, drinking lots of water, limiting cigarette smoke exposure, and avoiding sick contacts over the next 24-48 hours. Complete full course of antibiotic therapy. Return to clinic if symptoms worsen or do not improve in 7-10 days.   - amoxicillin-clavulanate (AUGMENTIN) 875-125 MG tablet; Take 1 tablet by mouth 2 (two) times daily.  Dispense: 20 tablet; Refill: 0

## 2017-08-19 ENCOUNTER — Other Ambulatory Visit: Payer: Self-pay

## 2017-08-19 ENCOUNTER — Encounter: Payer: Self-pay | Admitting: Family Medicine

## 2017-08-19 ENCOUNTER — Ambulatory Visit: Payer: Self-pay | Admitting: Family Medicine

## 2017-08-19 VITALS — BP 148/82 | HR 80 | Temp 98.0°F | Resp 16 | Ht 63.78 in | Wt 194.0 lb

## 2017-08-19 DIAGNOSIS — N6310 Unspecified lump in the right breast, unspecified quadrant: Secondary | ICD-10-CM

## 2017-08-19 NOTE — Progress Notes (Signed)
Subjective:    Patient ID: Darlene Beasley, female    DOB: May 09, 1956, 62 y.o.   MRN: [id number]  HPI Darlene Beasley is a 62 y.o. female Presents today for: Chief Complaint  Patient presents with  . Annual Exam  Cancer screening: Colonoscopy: has not had done.  Mammogram - last one in 30's - R breast issue below.  Cervical cancer screening: - none in 30 years.  primary reason for visit is R breast issue.  Fasting, plans on returning for physical  R breast - pinched self at work in October 2018, had bruising - improved, but still sore, and nipple started to look inverted since injury (December 2018). Does have some hardening of the area and warmness over past few months.    There are no active problems to display for this patient.  Past Medical History:  Diagnosis Date  . Arthritis   . GERD (gastroesophageal reflux disease)    Past Surgical History:  Procedure Laterality Date  . TUBAL LIGATION     No Known Allergies Prior to Admission medications   Not on File   Social History   Socioeconomic History  . Marital status: Divorced    Spouse name: Not on file  . Number of children: Not on file  . Years of education: Not on file  . Highest education level: Not on file  Social Needs  . Financial resource strain: Not on file  . Food insecurity - worry: Not on file  . Food insecurity - inability: Not on file  . Transportation needs - medical: Not on file  . Transportation needs - non-medical: Not on file  Occupational History  . Not on file  Tobacco Use  . Smoking status: Current Every Day Smoker    Packs/day: 0.50    Types: Cigarettes  . Smokeless tobacco: Never Used  Substance and Sexual Activity  . Alcohol use: No  . Drug use: No  . Sexual activity: Not on file  Other Topics Concern  . Not on file  Social History Narrative  . Not on file  ; Review of Systems  Respiratory: Positive for wheezing (off and on - hist smoking, not currently issue. ).   Skin:  Negative for color change, rash and wound.       Objective:   Physical Exam  Constitutional: She is oriented to person, place, and time. She appears well-developed and well-nourished. No distress.  HENT:  Head: Normocephalic and atraumatic.  Cardiovascular: Normal rate.  Pulmonary/Chest: Effort normal. Right breast exhibits inverted nipple, mass, skin change and tenderness. Right breast exhibits no nipple discharge.    Lymphadenopathy:    She has no axillary adenopathy (no appreciable axillary adenopathy or tenderness).  Neurological: She is alert and oriented to person, place, and time.  Psychiatric: She has a normal mood and affect.  Vitals reviewed.  Vitals:   08/19/17 1649  BP: (!) 148/82  Pulse: 80  Resp: 16  Temp: 98 F (36.7 C)  TempSrc: Oral  SpO2: 96%  Weight: 194 lb (88 kg)  Height: 5' 3.78" (1.62 m)          Assessment & Plan:  Darlene Beasley is a 62 y.o. female Breast mass, right - Plan: MM Digital Diagnostic Bilat, US BREAST COMPLETE UNI RIGHT INC AXILLA  - initially resented for physical, possibly main concern was right breast mass. Plans to return for physical, but health maintenance screening was discussed and options of referrals.Plan on fasting blood work at  next visit  -Right breast mass may be due to previous trauma and scar tissue versus cyst/seroma. However there is very firm with possible skin changes in now nipple retraction/inversion  -check diagnostic mammogram and ultrasound of right breast to determine next step  No orders of the defined types were placed in this encounter.  Patient Instructions    Will refer you for a diagnostic mammogram of the right breast and ultrasound to look at that abnormal area further.   I would recommend cervical cancer screening, colon cancer screening as we discussed. These can be discussed at a physical  - let me know when we refer you or perform those tests. See other health maintenance below that we will  discuss at your physical. Since you are not fasting, I can check blood work next time.   Keeping You Healthy  Get These Tests  Blood Pressure- Have your blood pressure checked by your healthcare provider at least once a year.  Normal blood pressure is 120/80.  Weight- Have your body mass index (BMI) calculated to screen for obesity.  BMI is a measure of body fat based on height and weight.  You can calculate your own BMI at [employer]  Cholesterol- Have your cholesterol checked every year.  Diabetes- Have your blood sugar checked every year if you have high blood pressure, high cholesterol, a family history of diabetes or if you are overweight.  Pap Test - Have a pap test every 1 to 5 years if you have been sexually active.  If you are older than 65 and recent pap tests have been normal you may not need additional pap tests.  In addition, if you have had a hysterectomy  for benign disease additional pap tests are not necessary.  Mammogram-Yearly mammograms are essential for early detection of breast cancer  Screening for Colon Cancer- Colonoscopy starting at age 69. Screening may begin sooner depending on your family history and other health conditions.  Follow up colonoscopy as directed by your Gastroenterologist.  Screening for Osteoporosis- Screening begins at age 63 with bone density scanning, sooner if you are at higher risk for developing Osteoporosis.  Get these medicines  Calcium with Vitamin D- Your body requires 1200-1500 mg of Calcium a day and [phone] IU of Vitamin D a day.  You can only absorb 500 mg of Calcium at a time therefore Calcium must be taken in 2 or 3 separate doses throughout the day.  Hormones- Hormone therapy has been associated with increased risk for certain cancers and heart disease.  Talk to your healthcare provider about if you need relief from menopausal symptoms.  Aspirin- Ask your healthcare provider about taking Aspirin to prevent Heart  Disease and Stroke.  Get these Immuniztions  Flu shot- Every fall  Pneumonia shot- Once after the age of 37; if you are younger ask your healthcare provider if you need a pneumonia shot.  Tetanus- Every ten years.  Zostavax- Once after the age of 9 to prevent shingles.  Take these steps  Don't smoke- Your healthcare provider can help you quit. For tips on how to quit, ask your healthcare provider or go to www.smokefree.gov or call 1-800 QUIT-NOW.  Be physically active- Exercise 5 days a week for a minimum of 30 minutes.  If you are not already physically active, start slow and gradually work up to 30 minutes of moderate physical activity.  Try walking, dancing, bike riding, swimming, etc.  Eat a healthy diet- Eat a variety of healthy foods  such as fruits, vegetables, whole grains, low fat milk, low fat cheeses, yogurt, lean meats, chicken, fish, eggs, dried beans, tofu, etc.  For more information go to www.thenutritionsource.org  Dental visit- Brush and floss teeth twice daily; visit your dentist twice a year.  Eye exam- Visit your Optometrist or Ophthalmologist yearly.  Drink alcohol in moderation- Limit alcohol intake to one drink or less a day.  Never drink and drive.  Depression- Your emotional health is as important as your physical health.  If you're feeling down or losing interest in things you normally enjoy, please talk to your healthcare provider.  Seat Belts- can save your life; always wear one  Smoke/Carbon Monoxide detectors- These detectors need to be installed on the appropriate level of your home.  Replace batteries at least once a year.  Violence- If anyone is threatening or hurting you, please tell your healthcare provider.  Living Will/ Health care power of attorney- Discuss with your healthcare provider and family.  IF you received an x-ray today, you will receive an invoice from [facility]. Please contact [facility] at [phone] with  questions or concerns regarding your invoice.   IF you received labwork today, you will receive an invoice from [place]. Please contact [facility] at [phone] with questions or concerns regarding your invoice.   Our billing staff will not be able to assist you with questions regarding bills from these companies.  You will be contacted with the lab results as soon as they are available. The fastest way to get your results is to activate your My Chart account. Instructions are located on the last page of this paperwork. If you have not heard from [place] regarding the results in 2 weeks, please contact this office.       Signed,   [name], MD [facility].  08/22/17 10:14 PM

## 2017-08-19 NOTE — Patient Instructions (Addendum)
Will refer you for a diagnostic mammogram of the right breast and ultrasound to look at that abnormal area further.   I would recommend cervical cancer screening, colon cancer screening as we discussed. These can be discussed at a physical  - let me know when we refer you or perform those tests. See other health maintenance below that we will discuss at your physical. Since you are not fasting, I can check blood work next time.   Keeping You Healthy  Get These Tests  Blood Pressure- Have your blood pressure checked by your healthcare provider at least once a year.  Normal blood pressure is 120/80.  Weight- Have your body mass index (BMI) calculated to screen for obesity.  BMI is a measure of body fat based on height and weight.  You can calculate your own BMI at [employer]  Cholesterol- Have your cholesterol checked every year.  Diabetes- Have your blood sugar checked every year if you have high blood pressure, high cholesterol, a family history of diabetes or if you are overweight.  Pap Test - Have a pap test every 1 to 5 years if you have been sexually active.  If you are older than 65 and recent pap tests have been normal you may not need additional pap tests.  In addition, if you have had a hysterectomy  for benign disease additional pap tests are not necessary.  Mammogram-Yearly mammograms are essential for early detection of breast cancer  Screening for Colon Cancer- Colonoscopy starting at age 20. Screening may begin sooner depending on your family history and other health conditions.  Follow up colonoscopy as directed by your Gastroenterologist.  Screening for Osteoporosis- Screening begins at age 43 with bone density scanning, sooner if you are at higher risk for developing Osteoporosis.  Get these medicines  Calcium with Vitamin D- Your body requires 1200-1500 mg of Calcium a day and [phone] IU of Vitamin D a day.  You can only absorb 500 mg of Calcium at a time  therefore Calcium must be taken in 2 or 3 separate doses throughout the day.  Hormones- Hormone therapy has been associated with increased risk for certain cancers and heart disease.  Talk to your healthcare provider about if you need relief from menopausal symptoms.  Aspirin- Ask your healthcare provider about taking Aspirin to prevent Heart Disease and Stroke.  Get these Immuniztions  Flu shot- Every fall  Pneumonia shot- Once after the age of 22; if you are younger ask your healthcare provider if you need a pneumonia shot.  Tetanus- Every ten years.  Zostavax- Once after the age of 37 to prevent shingles.  Take these steps  Don't smoke- Your healthcare provider can help you quit. For tips on how to quit, ask your healthcare provider or go to www.smokefree.gov or call 1-800 QUIT-NOW.  Be physically active- Exercise 5 days a week for a minimum of 30 minutes.  If you are not already physically active, start slow and gradually work up to 30 minutes of moderate physical activity.  Try walking, dancing, bike riding, swimming, etc.  Eat a healthy diet- Eat a variety of healthy foods such as fruits, vegetables, whole grains, low fat milk, low fat cheeses, yogurt, lean meats, chicken, fish, eggs, dried beans, tofu, etc.  For more information go to www.thenutritionsource.org  Dental visit- Brush and floss teeth twice daily; visit your dentist twice a year.  Eye exam- Visit your Optometrist or Ophthalmologist yearly.  Drink alcohol in moderation- Limit alcohol intake to  one drink or less a day.  Never drink and drive.  Depression- Your emotional health is as important as your physical health.  If you're feeling down or losing interest in things you normally enjoy, please talk to your healthcare provider.  Seat Belts- can save your life; always wear one  Smoke/Carbon Monoxide detectors- These detectors need to be installed on the appropriate level of your home.  Replace batteries at least  once a year.  Violence- If anyone is threatening or hurting you, please tell your healthcare provider.  Living Will/ Health care power of attorney- Discuss with your healthcare provider and family.  IF you received an x-ray today, you will receive an invoice from [facility]. Please contact [facility] at [phone] with questions or concerns regarding your invoice.   IF you received labwork today, you will receive an invoice from [place]. Please contact [facility] at [phone] with questions or concerns regarding your invoice.   Our billing staff will not be able to assist you with questions regarding bills from these companies.  You will be contacted with the lab results as soon as they are available. The fastest way to get your results is to activate your My Chart account. Instructions are located on the last page of this paperwork. If you have not heard from [place] regarding the results in 2 weeks, please contact this office.

## 2017-09-01 ENCOUNTER — Telehealth: Payer: Self-pay

## 2017-09-01 DIAGNOSIS — Z131 Encounter for screening for diabetes mellitus: Secondary | ICD-10-CM

## 2017-09-01 DIAGNOSIS — Z13220 Encounter for screening for lipoid disorders: Secondary | ICD-10-CM

## 2017-09-01 DIAGNOSIS — Z130 Encounter for screening for diseases of the blood and blood-forming organs and certain disorders involving the immune mechanism: Secondary | ICD-10-CM

## 2017-09-01 NOTE — Telephone Encounter (Signed)
Copied from [place]. 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 If someone could give her a call back about this at [phone]

## 2017-09-01 NOTE — Telephone Encounter (Signed)
Per 08/19/17 visit with Dr. Carlota Raspberry - "initially resented for physical, possibly main concern was right breast mass. Plans to return for physical, but health maintenance screening was discussed and options of referrals.Plan on fasting blood work at next visit".   No future appointments.   Lab work pended for [employer].

## 2017-09-02 NOTE — Telephone Encounter (Signed)
I do not mind ordering fasting blood work to have completed prior to next visit but please schedule follow up to discuss those results and other health maintenance as primarily addressed breast concern last visit. Thanks.

## 2017-09-05 NOTE — Telephone Encounter (Signed)
Called pt and let her know that there were orders in for her fasting blood work. She will stop by at some point and do that. She was unable to make an appt today due to a "mess" at work. She doesn't know exactly when she will be able to come in. She did want me to know that she has a mammogram scheduled for Tuesday.   She will call the office for an appt with Dr. Carlota Raspberry just as soon as she can.

## 2017-09-09 ENCOUNTER — Other Ambulatory Visit: Payer: Self-pay | Admitting: Family Medicine

## 2017-09-09 ENCOUNTER — Ambulatory Visit
Admission: RE | Admit: 2017-09-09 | Discharge: 2017-09-09 | Disposition: A | Discharge status: Home / Self Care | Payer: No Typology Code available for payment source | Source: Ambulatory Visit | Attending: Family Medicine | Admitting: Family Medicine

## 2017-09-09 DIAGNOSIS — N6320 Unspecified lump in the left breast, unspecified quadrant: Secondary | ICD-10-CM

## 2017-09-09 DIAGNOSIS — N6310 Unspecified lump in the right breast, unspecified quadrant: Secondary | ICD-10-CM

## 2017-09-10 ENCOUNTER — Ambulatory Visit: Payer: Self-pay | Admitting: Family Medicine

## 2017-09-10 DIAGNOSIS — Z13220 Encounter for screening for lipoid disorders: Secondary | ICD-10-CM

## 2017-09-10 DIAGNOSIS — Z130 Encounter for screening for diseases of the blood and blood-forming organs and certain disorders involving the immune mechanism: Secondary | ICD-10-CM

## 2017-09-10 DIAGNOSIS — Z131 Encounter for screening for diabetes mellitus: Secondary | ICD-10-CM

## 2017-09-10 NOTE — Progress Notes (Signed)
Lab Only Visit 

## 2017-09-11 LAB — COMPREHENSIVE METABOLIC PANEL
A/G RATIO: 2.8 — AB (ref 1.2–2.2)
ALBUMIN: 4.8 g/dL (ref 3.6–4.8)
ALT: 18 IU/L (ref 0–32)
AST: 19 IU/L (ref 0–40)
Alkaline Phosphatase: 68 IU/L (ref 39–117)
BUN/Creatinine Ratio: 26 (ref 12–28)
BUN: 25 mg/dL (ref 8–27)
Bilirubin Total: 0.4 mg/dL (ref 0.0–1.2)
CALCIUM: 9.5 mg/dL (ref 8.7–10.3)
CO2: 18 mmol/L — ABNORMAL LOW (ref 20–29)
Chloride: 106 mmol/L (ref 96–106)
Creatinine, Ser: 0.98 mg/dL (ref 0.57–1.00)
GFR, EST AFRICAN AMERICAN: 71 mL/min/{1.73_m2} (ref >59)
GFR, EST NON AFRICAN AMERICAN: 62 mL/min/{1.73_m2} (ref >59)
GLOBULIN, TOTAL: 1.7 g/dL (ref 1.5–4.5)
Glucose: 91 mg/dL (ref 65–99)
POTASSIUM: 4.9 mmol/L (ref 3.5–5.2)
SODIUM: 143 mmol/L (ref 134–144)
TOTAL PROTEIN: 6.5 g/dL (ref 6.0–8.5)

## 2017-09-11 LAB — CBC WITH DIFFERENTIAL/PLATELET
BASOS: 0 %
Basophils Absolute: 0 10*3/uL (ref 0.0–0.2)
EOS (ABSOLUTE): 0.2 10*3/uL (ref 0.0–0.4)
Eos: 2 %
Hematocrit: 42.3 % (ref 34.0–46.6)
Hemoglobin: 14.6 g/dL (ref 11.1–15.9)
IMMATURE GRANS (ABS): 0 10*3/uL (ref 0.0–0.1)
Immature Granulocytes: 0 %
LYMPHS: 36 %
Lymphocytes Absolute: 2.9 10*3/uL (ref 0.7–3.1)
MCH: 30.5 pg (ref 26.6–33.0)
MCHC: 34.5 g/dL (ref 31.5–35.7)
MCV: 88 fL (ref 79–97)
MONOS ABS: 0.5 10*3/uL (ref 0.1–0.9)
Monocytes: 6 %
NEUTROS ABS: 4.4 10*3/uL (ref 1.4–7.0)
Neutrophils: 56 %
PLATELETS: 225 10*3/uL (ref 150–379)
RBC: 4.79 x10E6/uL (ref 3.77–5.28)
RDW: 14.1 % (ref 12.3–15.4)
WBC: 8 10*3/uL (ref 3.4–10.8)

## 2017-09-11 LAB — LIPID PANEL
CHOLESTEROL TOTAL: 190 mg/dL (ref 100–199)
Chol/HDL Ratio: 3.1 ratio (ref 0.0–4.4)
HDL: 61 mg/dL (ref >39)
LDL Calculated: 117 mg/dL — ABNORMAL HIGH (ref 0–99)
TRIGLYCERIDES: 61 mg/dL (ref 0–149)
VLDL Cholesterol Cal: 12 mg/dL (ref 5–40)

## 2017-09-16 ENCOUNTER — Ambulatory Visit
Admission: RE | Admit: 2017-09-16 | Discharge: 2017-09-16 | Disposition: A | Discharge status: Home / Self Care | Payer: No Typology Code available for payment source | Source: Ambulatory Visit | Attending: Family Medicine | Admitting: Family Medicine

## 2017-09-16 ENCOUNTER — Ambulatory Visit (HOSPITAL_COMMUNITY)
Admission: RE | Admit: 2017-09-16 | Discharge: 2017-09-16 | Disposition: A | Discharge status: Home / Self Care | Payer: Self-pay | Source: Ambulatory Visit | Attending: Obstetrics and Gynecology | Admitting: Obstetrics and Gynecology

## 2017-09-16 ENCOUNTER — Other Ambulatory Visit: Payer: Self-pay | Admitting: Family Medicine

## 2017-09-16 ENCOUNTER — Other Ambulatory Visit: Payer: Self-pay

## 2017-09-16 ENCOUNTER — Encounter (HOSPITAL_COMMUNITY): Payer: Self-pay

## 2017-09-16 VITALS — BP 164/82 | Ht 64.0 in | Wt 181.8 lb

## 2017-09-16 DIAGNOSIS — N6310 Unspecified lump in the right breast, unspecified quadrant: Secondary | ICD-10-CM

## 2017-09-16 DIAGNOSIS — Z01419 Encounter for gynecological examination (general) (routine) without abnormal findings: Secondary | ICD-10-CM

## 2017-09-16 DIAGNOSIS — N6320 Unspecified lump in the left breast, unspecified quadrant: Secondary | ICD-10-CM

## 2017-09-16 NOTE — Patient Instructions (Addendum)
Explained breast self awareness with [name]. Let patient know [facility] will cover Pap smears and HPV typing every 5 years unless has a history of abnormal Pap smears. Referred patient to the [facility] for bilateral breast biopsies per recommendation. Appointment scheduled for Tuesday, September 23, 2017 at 1445. Patient aware of appointment and will be there. Let patient know will follow up with her within the next couple weeks with results of Pap smear by letter or phone. Discussed smoking cessation with patient. Referred to the [facility] Quitline and gave resources to the free smoking cessation classes at [facility]. [name] verbalized understanding.  Sadiyah Kangas, Arvil Chaco, RN 3:01 PM

## 2017-09-16 NOTE — Progress Notes (Addendum)
Patient referred to [facility] by the [facility] due to recommending bilateral breast biopsies. Bilateral diagnostic mammogram completed 09/09/2017.  Pap Smear: Pap smear completed today. Last Pap smear was 26 years ago and normal per patient. Per patient has no history of an abnormal Pap smear.  Physical exam: Breasts Right breast larger than left breast that per patient she has noticed a change over the past week. No skin abnormalities left breast. Right outer breast has the orange peel appearance. No nipple retraction left breast. Right nipple inverted that per patient has been changing since January 2019. No nipple discharge bilateral breasts. No lymphadenopathy. No lumps palpated left breast. Palpated a right breast mass between 12 o'clock and 6 o'clock right outer breast. No complaints of pain or tenderness on exam. Referred patient to the [facility] for bilateral breast biopsies per recommendation. Appointment scheduled for Tuesday, September 23, 2017 at 1445.        Pelvic/Bimanual   Ext Genitalia No lesions, no swelling and no discharge observed on external genitalia.         Vagina Vagina pink and normal texture. No lesions or discharge observed in vagina.          Cervix Cervix is present. Cervix pink and of normal texture. No discharge observed.     Uterus Uterus is present and palpable. Uterus in normal position and normal size.        Adnexae Bilateral ovaries present and palpable. No tenderness on palpation.         Rectovaginal No rectal exam completed today since patient had no rectal complaints. No skin abnormalities observed on exam.    Smoking History: Patient is a current smoker. Discussed smoking cessation with patient. Referred to the [facility] Quitline and gave resources to the free smoking cessation classes at [facility].  Patient Navigation: Patient education provided. Access to services provided for patient through [place] [facility] program.    Colorectal Cancer Screening: Per patient has never had a colonoscopy completed. No complaints today. FIT Test given to patient to complete and return to [facility].  Breast and Cervical Cancer Risk Assessment: Patient has no family history of breast cancer, known genetic mutations, or radiation treatment to the chest before age 52. Patient has no history of cervical dysplasia, immunocompromised, or DES exposure in-utero.

## 2017-09-16 NOTE — Addendum Note (Signed)
Encounter addended by: Loletta Parish, RN on: 09/16/2017 5:01 PM  Actions taken: Sign clinical note

## 2017-09-17 LAB — CYTOLOGY - PAP
DIAGNOSIS: NEGATIVE
HPV (WINDOPATH): NOT DETECTED

## 2017-09-18 ENCOUNTER — Telehealth: Payer: Self-pay | Admitting: Family Medicine

## 2017-09-18 NOTE — Telephone Encounter (Signed)
Copied from [place] [id number]. Topic: Quick Communication - See Telephone Encounter >> Sep 18, 2017 11:20 AM Rutherford Nail, NT wrote: CRM for notification. See Telephone encounter for: 09/18/17. Patient calling to get lab results. Please advise.

## 2017-09-19 ENCOUNTER — Encounter (HOSPITAL_COMMUNITY): Payer: Self-pay | Admitting: *Deleted

## 2017-09-19 NOTE — Telephone Encounter (Signed)
Phone call to patient. Unable to reach. If patient calls back, please relay to her provider needs to first review her lab results. She will be contacted with the lab results as soon as they are available. The fastest way to get her results is to activate your My Chart account. Instructions are located on the last page of AVS provided to her at the last office visit with Dr. Carlota Raspberry. If she has not heard from [place] regarding the results in 2 weeks, please contact this office.

## 2017-09-22 ENCOUNTER — Other Ambulatory Visit: Payer: Self-pay | Admitting: Obstetrics and Gynecology

## 2017-09-22 ENCOUNTER — Encounter: Payer: Self-pay | Admitting: *Deleted

## 2017-09-22 DIAGNOSIS — N6310 Unspecified lump in the right breast, unspecified quadrant: Secondary | ICD-10-CM

## 2017-09-22 DIAGNOSIS — N6320 Unspecified lump in the left breast, unspecified quadrant: Secondary | ICD-10-CM

## 2017-09-22 NOTE — Telephone Encounter (Signed)
Pt called back and she understand she will receive a call once the labs are back.

## 2017-09-23 ENCOUNTER — Ambulatory Visit
Admission: RE | Admit: 2017-09-23 | Discharge: 2017-09-23 | Disposition: A | Discharge status: Home / Self Care | Payer: No Typology Code available for payment source | Source: Ambulatory Visit | Attending: Family Medicine | Admitting: Family Medicine

## 2017-09-23 DIAGNOSIS — N6310 Unspecified lump in the right breast, unspecified quadrant: Secondary | ICD-10-CM

## 2017-09-23 DIAGNOSIS — N6320 Unspecified lump in the left breast, unspecified quadrant: Secondary | ICD-10-CM

## 2017-09-24 ENCOUNTER — Ambulatory Visit (INDEPENDENT_AMBULATORY_CARE_PROVIDER_SITE_OTHER): Payer: Self-pay

## 2017-09-24 ENCOUNTER — Ambulatory Visit (INDEPENDENT_AMBULATORY_CARE_PROVIDER_SITE_OTHER): Payer: Self-pay | Admitting: Physician Assistant

## 2017-09-24 ENCOUNTER — Other Ambulatory Visit: Payer: Self-pay

## 2017-09-24 ENCOUNTER — Encounter: Payer: Self-pay | Admitting: Physician Assistant

## 2017-09-24 VITALS — BP 128/90 | HR 87 | Temp 98.6°F | Resp 16 | Ht 64.0 in | Wt 178.2 lb

## 2017-09-24 DIAGNOSIS — R059 Cough, unspecified: Secondary | ICD-10-CM

## 2017-09-24 DIAGNOSIS — R05 Cough: Secondary | ICD-10-CM

## 2017-09-24 DIAGNOSIS — R062 Wheezing: Secondary | ICD-10-CM

## 2017-09-24 DIAGNOSIS — J40 Bronchitis, not specified as acute or chronic: Secondary | ICD-10-CM

## 2017-09-24 MED ORDER — PREDNISONE 20 MG PO TABS: 40.0000 mg | ORAL_TABLET | Freq: Every day | ORAL | 0 refills | Status: AC

## 2017-09-24 MED ORDER — AZITHROMYCIN 250 MG PO TABS: ORAL_TABLET | ORAL | 0 refills | Status: DC

## 2017-09-24 MED ORDER — BENZONATATE 100 MG PO CAPS: 100.0000 mg | ORAL_CAPSULE | Freq: Three times a day (TID) | ORAL | 0 refills | Status: DC | PRN

## 2017-09-24 NOTE — Patient Instructions (Addendum)
Start taking Prednisone '40mg'$  daily (with meals) for the next 5 days. If you are not improving, you may start the Azithromycin.  Cut back or stop smoking for the next week or two while you are getting over this.  Come back in 5-7 days if your symptoms worsen.   Stay well hydrated. Get lost of rest. Wash your hands often.   -Foods that can help speed recovery: honey, garlic, chicken soup, elderberries, green tea.  -Supplements that can help speed recovery: vitamin C, zinc, elderberry extract, quercetin, ginseng, selenium -Supplement with prebiotics and probiotics:   Advil or ibuprofen for pain. Do not take Aspirin.  Drink enough water and fluids to keep your urine clear or pale yellow.  For sore throat: ? Gargle with 8 oz of salt water ( tsp of salt per 1 qt of water) as often as every 1-2 hours to soothe your throat.  Gargle liquid benadryl.  Cepacol throat lozenges (if you are not at risk for choking).  For sore throat try using a honey-based tea. Use 3 teaspoons of honey with juice squeezed from half lemon. Place shaved pieces of ginger into 1/2-1 cup of water and warm over stove top. Then mix the ingredients and repeat every 4 hours as needed.  Cough Syrup Recipe: Sweet Lemon & Honey Thyme  Ingredients a handful of fresh thyme sprigs   1 pint of water (2 cups)  1/2 cup honey (raw is best, but regular will do)  1/2 lemon chopped Instructions 1. Place the lemon in the pint jar and cover with the honey. The honey will macerate the lemons and draw out liquids which taste so delicious! 2. Meanwhile, toss the thyme leaves into a saucepan and cover them with the water. 3. Bring the water to a gentle simmer and reduce it to half, about a cup of tea. 4. When the tea is reduced and cooled a bit, strain the sprigs & leaves, add it into the pint jar and stir it well. 5. Give it a shake and use a spoonful as needed. 6. Store your homemade cough syrup in the refrigerator for about a  month.  What causes a cough? In adults, common causes of a cough include: ?An infection of the airways or lungs (such as the common cold) ?Postnasal drip - Postnasal drip is when mucus from the nose drips down or flows along the back of the throat. Postnasal drip can happen when people have: .A cold .Allergies .A sinus infection - The sinuses are hollow areas in the bones of the face that open into the nose. ?Lung conditions, like asthma and chronic obstructive pulmonary disease (COPD) - Both of these conditions can make it hard to breathe. COPD is usually caused by smoking. ?Acid reflux - Acid reflux is when the acid that is normally in your stomach backs up into your esophagus (the tube that carries food from your mouth to your stomach). ?A side effect from blood pressure medicines called "ACE inhibitors" ?Smoking cigarettes  Is there anything I can do on my own to get rid of my cough? Yes. To help get rid of your cough, you can: ?Use a humidifier in your bedroom ?Use an over-the-counter cough medicine, or suck on cough drops or hard candy ?Stop smoking, if you smoke ?If you have allergies, avoid the things you are allergic to (like pollen, dust, animals, or mold) If you have acid reflux, your doctor or nurse will tell you which lifestyle changes can help reduce symptoms.  Thank you for coming in today. I hope you feel we met your needs.  Feel free to call PCP if you have any questions or further requests.  Please consider signing up for MyChart if you do not already have it, as this is a great way to communicate with me.  Best,  Whitney McVey, PA-C   IF you received an x-ray today, you will receive an invoice from [facility]. Please contact [facility] at [phone] with questions or concerns regarding your invoice.   IF you received labwork today, you will receive an invoice from [place] [facility]. Please contact [facility] at [phone] with questions or concerns  regarding your invoice.   Our billing staff will not be able to assist you with questions regarding bills from these companies.  You will be contacted with the lab results as soon as they are available. The fastest way to get your results is to activate your My Chart account. Instructions are located on the last page of this paperwork. If you have not heard from [place] regarding the results in 2 weeks, please contact this office.

## 2017-09-24 NOTE — Progress Notes (Signed)
Darlene Beasley  MRN: [id number] DOB: 12/12/1955  PCP: [name], MD  Subjective:  Pt is a pleasant 62 year old female PMH arthritis and GERD who presents to clinic for cough, shob, wheezing and congestion x 5 days. Cough is productive with "a tinge of yellow". Mucus drains down the back of her throat.  "My normal temp is about 96, so today's temp of 98 is a low grade." "I feel run down and crappy".  She has taken aspirin and mucinex.  ROS below.   Of note she had multiple b/l breast biopsy yesterday. She has appt with surgeon in 5 days to discuss results.   She has changed her diet to no-sugar. Has lost about 15lbs in one monthShe is a regular smoker since age 40. She is trying to quit. She is smoking < 1 pack/day.  She has one pack of cigarettes left and has no plans to purchase any more after this. She has nicorette gum to help her quit.   Review of Systems  Constitutional: Negative for chills, diaphoresis, fatigue and fever.  HENT: Positive for congestion, postnasal drip and rhinorrhea. Negative for sinus pressure, sinus pain and sore throat.   Respiratory: Positive for cough. Negative for shortness of breath and wheezing.   Psychiatric/Behavioral: Negative for sleep disturbance.    There are no active problems to display for this patient.   Current Outpatient Medications on File Prior to Visit  Medication Sig Dispense Refill  . guaiFENesin (MUCINEX) 600 MG 12 hr tablet Take by mouth 2 (two) times daily.     No current facility-administered medications on file prior to visit.     No Known Allergies   Objective:  BP 128/90   Pulse 87   Temp 98.6 F (37 C) (Oral)   Resp 16   Ht 5\' 4"  (1.626 m)   Wt 178 lb 3.2 oz (80.8 kg)   SpO2 96%   BMI 30.59 kg/m   Physical Exam  Constitutional: She is oriented to person, place, and time. No distress.  Cardiovascular: Normal rate, regular rhythm and normal heart sounds.  Pulmonary/Chest: Effort normal. No accessory  muscle usage. No respiratory distress. She has wheezes in the right upper field and the left upper field.  Neurological: She is alert and oriented to person, place, and time.  Skin: Skin is warm and dry.  Psychiatric: Judgment normal.  Vitals reviewed.  .Dg Chest 2 View  Result Date: 09/24/2017 CLINICAL DATA:  Cough EXAM: CHEST - 2 VIEW COMPARISON:  09/08/2013 FINDINGS: The heart size and mediastinal contours are within normal limits. Both lungs are clear. The visualized skeletal structures are unremarkable. IMPRESSION: No active cardiopulmonary disease. Electronically Signed   By: Franchot Gallo M.D.   On: 09/24/2017 18:14    Assessment and Plan :  1. Cough - DG Chest 2 View; Future - benzonatate (TESSALON) 100 MG capsule; Take 1-2 capsules (100-200 mg total) by mouth 3 (three) times daily as needed for cough.  Dispense: 40 capsule; Refill: 0 - Pt presents for worsening cough x 5 days. Chest x-ray is negative and vitals are stable. Plan to treat with prednisone x 5 days. OK to fill azithromycin if no improvement at that time. RTC if symptoms worsen.   2. Wheezing - predniSONE (DELTASONE) 20 MG tablet; Take 2 tablets (40 mg total) by mouth daily with breakfast for 5 days.  Dispense: 10 tablet; Refill: 0  3. Bronchitis - azithromycin (ZITHROMAX) 250 MG tablet; Take 2 tabs PO  x 1 dose, then 1 tab PO QD x 4 days  Dispense: 6 tablet; Refill: 0   Mercer Pod, PA-C  [facility] 09/24/2017 5:35 PM

## 2017-09-26 LAB — FECAL OCCULT BLOOD, IMMUNOCHEMICAL: Fecal Occult Bld: NEGATIVE

## 2017-09-30 ENCOUNTER — Telehealth: Payer: Self-pay | Admitting: Nurse Practitioner

## 2017-09-30 ENCOUNTER — Encounter: Payer: Self-pay | Admitting: Nurse Practitioner

## 2017-09-30 ENCOUNTER — Telehealth: Payer: Self-pay | Admitting: *Deleted

## 2017-09-30 NOTE — Telephone Encounter (Signed)
"  I just missed call from this number about Darlene Beasley.  This is Laurence Aly, she has me listed on everything so my name should be in her records.  I believe her birthday is December 24, 1955.  Learned she needs to do something about the breast cancer she has"  Call transferred to new patient coordinator.

## 2017-09-30 NOTE — Telephone Encounter (Signed)
Spoke to the pt's signifcant other, John to schedule an appt. Appt has been scheduled for the pt to see Darlene Beasley & Darlene Beasley on 5/1 at 2pm. The dates I provided, the pt was unable to come. Letter mailed.

## 2017-10-01 ENCOUNTER — Encounter: Payer: Self-pay | Admitting: Radiation Oncology

## 2017-10-01 ENCOUNTER — Other Ambulatory Visit: Payer: Self-pay | Admitting: General Surgery

## 2017-10-01 DIAGNOSIS — C50811 Malignant neoplasm of overlapping sites of right female breast: Secondary | ICD-10-CM

## 2017-10-02 ENCOUNTER — Encounter (HOSPITAL_COMMUNITY): Payer: Self-pay | Admitting: *Deleted

## 2017-10-02 NOTE — Progress Notes (Signed)
Letter mailed to patient with negative pap smear results. HPV was negative. Next pap smear due in five years. 

## 2017-10-07 ENCOUNTER — Ambulatory Visit
Admission: RE | Admit: 2017-10-07 | Discharge: 2017-10-07 | Disposition: A | Discharge status: Home / Self Care | Payer: Self-pay | Source: Ambulatory Visit | Attending: General Surgery | Admitting: General Surgery

## 2017-10-07 ENCOUNTER — Inpatient Hospital Stay
Admission: RE | Admit: 2017-10-07 | Discharge: 2017-10-07 | Disposition: A | Discharge status: Home / Self Care | Payer: No Typology Code available for payment source | Source: Ambulatory Visit | Attending: General Surgery | Admitting: General Surgery

## 2017-10-07 DIAGNOSIS — C50811 Malignant neoplasm of overlapping sites of right female breast: Secondary | ICD-10-CM

## 2017-10-07 MED ORDER — GADOBENATE DIMEGLUMINE 529 MG/ML IV SOLN
16.0000 mL | Freq: Once | INTRAVENOUS | Status: AC | PRN
  Administered 2017-10-07: 16 mL via INTRAVENOUS

## 2017-10-08 ENCOUNTER — Encounter: Payer: Self-pay | Admitting: Nurse Practitioner

## 2017-10-08 ENCOUNTER — Inpatient Hospital Stay: Payer: Medicaid Other | Attending: Nurse Practitioner | Admitting: Nurse Practitioner

## 2017-10-08 ENCOUNTER — Telehealth: Payer: Self-pay

## 2017-10-08 DIAGNOSIS — Z5111 Encounter for antineoplastic chemotherapy: Secondary | ICD-10-CM | POA: Insufficient documentation

## 2017-10-08 DIAGNOSIS — Z7689 Persons encountering health services in other specified circumstances: Secondary | ICD-10-CM | POA: Insufficient documentation

## 2017-10-08 DIAGNOSIS — F1290 Cannabis use, unspecified, uncomplicated: Secondary | ICD-10-CM | POA: Diagnosis not present

## 2017-10-08 DIAGNOSIS — C50412 Malignant neoplasm of upper-outer quadrant of left female breast: Secondary | ICD-10-CM

## 2017-10-08 DIAGNOSIS — Z79899 Other long term (current) drug therapy: Secondary | ICD-10-CM | POA: Insufficient documentation

## 2017-10-08 DIAGNOSIS — C50912 Malignant neoplasm of unspecified site of left female breast: Secondary | ICD-10-CM | POA: Insufficient documentation

## 2017-10-08 DIAGNOSIS — Z170 Estrogen receptor positive status [ER+]: Secondary | ICD-10-CM | POA: Insufficient documentation

## 2017-10-08 DIAGNOSIS — C50811 Malignant neoplasm of overlapping sites of right female breast: Secondary | ICD-10-CM | POA: Insufficient documentation

## 2017-10-08 DIAGNOSIS — D493 Neoplasm of unspecified behavior of breast: Secondary | ICD-10-CM

## 2017-10-08 DIAGNOSIS — Z9011 Acquired absence of right breast and nipple: Secondary | ICD-10-CM | POA: Diagnosis not present

## 2017-10-08 NOTE — Telephone Encounter (Signed)
Patient called to inquire whether or not she can eat breakfast prior to her 130pm appointment. Patient advised it is ok for her to eat breakfast.

## 2017-10-08 NOTE — Telephone Encounter (Signed)
Will finish patient 5/1 los

## 2017-10-08 NOTE — Progress Notes (Addendum)
[place]  [phone] Fax:([phone]) [place] Note   Patient Care Team: [name], MD as PCP - General (Family Medicine)  Date of Service: 10/08/2017  CHIEF COMPLAINTS/PURPOSE OF CONSULTATION:  Bilateral breast cancer   Referred by Dr. [name], MD  Oncology History   Cancer Staging Cancer of overlapping sites of right female breast [facility]) Staging form: Breast, AJCC 8th Edition - Clinical stage from 09/23/2017: Stage IIIA (cT3, cN1, cM0, G2, ER+, PR-, HER2-) - Signed by [name], MD on 10/09/2017 - Clinical: cT3 - Unsigned       Cancer of overlapping sites of right female breast ([place])   09/09/2017 Mammogram    IMPRESSION: 1. Highly suspicious large right breast mass involving the outer and upper breast extending from approximately 9 o'clock through 12-1 o'clock, maximum measurement approximating 7.5 cm. The mass involves the right nipple, accounting for nipple retraction. Architectural distortion, microcalcifications and diffuse trabecular thickening and skin thickening are associated with the large mass, raising the possibility of this representing an inflammatory cancer. 2. Satellite masses separate from the dominant mass involving the lower outer quadrant and the upper inner quadrant of the right breast, measured above. 3. Two adjacent pathologic right axillary lymph nodes. 4. Indeterminate 0.9 cm solid mass involving the upper outer quadrant of the left breast which accounts for a mammographic finding. 5. No pathologic left axillary lymphadenopathy.      09/09/2017 Breast US    Targeted right breast ultrasound is performed, showing a very large hypoechoic mass with irregular margins extending from the approximate 9 o'clock position through the 12 to 1 o'clock position. On the CC gait image, the mass measures maximally approximately 7.5 cm. The mass has irregular margins, demonstrates acoustic shadowing, and demonstrates  internal power Doppler flow. The mass extends into the retroareolar region and involves the nipple, accounting for the nipple retraction.  Separate from the dominant mass at the 7 o'clock position approximately 4 cm from nipple is a hypoechoic antiparallel mass with irregular margins measuring approximately 1.4 x 2.7 x 1.7 cm.  Separate from the dominant mass at the 1 o'clock position approximately 2 cm from nipple is a hypoechoic mass with irregular margins measuring approximately 1.7 x 0.5 x 1.6 cm. Both of these masses also demonstrate internal power Doppler flow.  Sonographic evaluation of the right axilla demonstrates 2 adjacent pathologic lymph nodes, the larger measuring approximately 2.5 x 2.5 x 2.8 cm, the smaller measuring approximately 2.0 x 0.8 x 2.7 cm.      09/23/2017 Initial Biopsy    Diagnosis 1. Breast, left, needle core biopsy, 2 o'clock - INVASIVE DUCTAL CARCINOMA. - SEE COMMENT. 2. Breast, right, needle core biopsy, 11:30 o'clock - INVASIVE MAMMARY CARCINOMA. - SEE COMMENT. 3. Lymph node, needle/core biopsy, right axilla - METASTATIC MAMMARY CARCINOMA IN 1 OF 1 LYMPH NODE (1/1). Microscopic Comment 1. There is a small focus of grade I invasive ductal carcinoma. A complete breast prognostic profile will be attempted and the results reported separately. The results were called to the [facility] on 09/24/2017. 2. The carcinoma appears grade II. An E-Cadherin stain and a breast prognostic profile will be performed on part 2 and the results reported separately. (JBK:kh 09-24-17) [name]  1. HER2 Negative by FISH Estrogen receptor: 100% positive, strong staining Progesterone receptor: 80% positive, strong staining Ki67: 5%  2. HER2 Negative by FISH Estrogen receptor: 100% positive, strong staining Progesterone receptor: 0%, negative  Ki67: 30% 2.  The tumor cells are strongly positive for E-cadherin, supporting a ductal phenotype      09/23/2017  Cancer Staging    Staging form: Breast, AJCC 8th Edition - Clinical stage from 09/23/2017: Stage IIIA (cT3, cN1, cM0, G2, ER+, PR-, HER2-) - Signed by [name], MD on 10/09/2017      10/07/2017 Breast MRI    IMPRESSION: 1. Biopsy proven malignancy involving all 4 quadrants of the right breast, although predominantly located in the anterior third of the central upper right breast measures 6.8 x 9.6 x 7.2 cm.  2. Five pathologic right axillary lymph nodes, compatible with biopsy proven axillary metastases.  3. Small mass with associated biopsy related changes in the upper-outer left breast at site of known malignancy measures 0.9 x 0.8 x 0.7 cm. No abnormal lymph nodes seen in the left axilla.  RECOMMENDATION: Treatment plan for known bilateral breast malignancy.  BI-RADS CATEGORY  6: Known biopsy-proven malignancy.      10/08/2017 Initial Diagnosis    Cancer of overlapping sites of right female breast ([place])      10/21/2017 -  Chemotherapy    The patient had DOXOrubicin (ADRIAMYCIN) chemo injection 112 mg, 60 mg/m2, Intravenous,  Once, 0 of 4 cycles PALONOSETRON HCL INJECTION 0.25 MG/5ML, 0.25 mg, Intravenous,  Once, 0 of 4 cycles pegfilgrastim-cbqv (UDENYCA) injection 6 mg, 6 mg, Subcutaneous, Once, 0 of 4 cycles cyclophosphamide (CYTOXAN) 1,120 mg in sodium chloride 0.9 % 250 mL chemo infusion, 600 mg/m2, Intravenous,  Once, 0 of 4 cycles PACLitaxel (TAXOL) 150 mg in sodium chloride 0.9 % 250 mL chemo infusion (</= '80mg'$ /m2), 80 mg/m2, Intravenous,  Once, 0 of 12 cycles FOSAPREPITANT '150MG'$  + DEXAMETHASONE INFUSION [facility], , Intravenous,  Once, 0 of 4 cycles  for chemotherapy treatment.        Malignant neoplasm of left breast in female, estrogen receptor positive ([place])   09/09/2017 Breast US    Targeted left breast ultrasound is performed, showing an oval parallel hypoechoic mass containing microcalcifications at the 2 o'clock position approximately 6 cm from nipple measuring  approximately 0.9 x 0.5 x 0.9 cm, corresponding to the mammographic finding.  Sonographic evaluation of the left axilla demonstrates no pathologic lymphadenopathy.      09/23/2017 Initial Biopsy    Diagnosis 1. Breast, left, needle core biopsy, 2 o'clock - INVASIVE DUCTAL CARCINOMA. - SEE COMMENT. 2. Breast, right, needle core biopsy, 11:30 o'clock - INVASIVE MAMMARY CARCINOMA. - SEE COMMENT. 3. Lymph node, needle/core biopsy, right axilla - METASTATIC MAMMARY CARCINOMA IN 1 OF 1 LYMPH NODE (1/1). Microscopic Comment 1. There is a small focus of grade I invasive ductal carcinoma. A complete breast prognostic profile will be attempted and the results reported separately. The results were called to the [facility] on 09/24/2017. 2. The carcinoma appears grade II. An E-Cadherin stain and a breast prognostic profile will be performed on part 2 and the results reported separately. (JBK:kh 09-24-17) [name]  1. HER2 Negative by FISH Estrogen receptor: 100% positive, strong staining Progesterone receptor: 80% positive, strong staining Ki67: 5%  2. HER2 Negative by FISH Estrogen receptor: 100% positive, strong staining Progesterone receptor: 0%, negative  Ki67: 30%      10/08/2017 Initial Diagnosis    Malignant neoplasm of left breast in female, estrogen receptor positive ([place])       HISTORY OF PRESENTING ILLNESS:  Darlene Beasley 62 y.o. female is here because of newly diagnosed bilateral breast cancer. In 03/2017 she "pinched" herself at work  and developed a right breast bruise and soreness which finally resolved in 04/2017. In January 2019 she noticed right nipple inversion. She researched online and made plans to have a mammogram, last done 30 years ago. She had a palpable breast 'lump" which she felt after her son was born 63 years ago that was never biopsied and eventually resolved. She reports intentional weight loss.   Imaging revealed a large right breast  mass involving the upper outer quadrant extending from 9 o'clock to 12-1 o'clock, measuring approximately 7.5 cm, involving the right nipple. There is a satellite lesion at the 7 o'clock position approximately 4 cm from the nipple with irregular margins measuring 1.4 x 2.7, x 1.7 cm. Additional satellite lesion at 1 o'clock position approximately 2 cm from the nipple measures 1.7 x 0.5 x 1.6 cm. [place] of right axilla reveals 2 adjacent pathologic lymph nodes, measuring 2.5 x 2.5 x 2.8 cm and 2.0 x 0.8 x 2.7 cm. Biopsy of the right breast revealed invasive mammary carcinoma, tumor cells are strongly positive for E-cadherin, supporting a ductal phenotype; grade II, HER2 negative, ER positive, PR negative, metastatic to right axilla. Targeted left breast US reveals hypoechoic mass at the 2 o'clock position approximately 6 cm from the nipple measuring 0.9 x 0.5 x 0.9 fm. No suspicious adenopathy in the left axilla. Biopsy of the left breast revealed invasive ductal carcinoma, grade I, HER2 negative, ER/PR positive. Breast MRI revealed biopsy-proven malignancy involving all 4 quadrants of the right breast, predominantly in the anterior third of the central upper right breast measuring 6.8 x 9.6 x 7.2 cm with 5 pathologic right axillary lymph nodes, compatible with biopsy-proven axillary metastases. In the left breast there is a small mass with associated biopsy related changes in the upper outer left breast at site of known malignancy, no abnormal axillary lymph nodes.   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 Had GERD in the past. She has smoked 1-2 PPD cigarettes x51 years, currently weaning. She quit drinking alcohol when she changed her diet and began losing weight in 08/2017. She has a significant other with whom she resides. She is independent of all ADLs. She is adopted, family history unknown.  Today she feels well, she has  lingering cough from recent URI. No fever or chills. She is fatigued at baseline due to busy job, she works 14-hour days, 60 hours per week, working at [employer] in [place]. She tolerated breast biopsy well with mild bruising and swelling.   GYN HISTORY  Menarchal: age 89 LMP: age 104-50 Contraceptive: age 58-38 OCP HRT: None G1P1   MEDICAL HISTORY:  Past Medical History:  Diagnosis Date  . Arthritis   . GERD (gastroesophageal reflux disease)     SURGICAL HISTORY: Past Surgical History:  Procedure Laterality Date  . TUBAL LIGATION      SOCIAL HISTORY: Social History   Socioeconomic History  . Marital status: Significant Other    Spouse name: Not on file  . Number of children: 1  . Years of education: Not on file  . Highest education level: Not on file  Occupational History  . Not on file  Social Needs  . Financial resource strain: Not on file  . Food insecurity:    Worry: Not on file    Inability: Not on file  . Transportation needs:    Medical: Not on file  Non-medical: Not on file  Tobacco Use  . Smoking status: Current Every Day Smoker    Packs/day: 0.50    Years: 51.00    Pack years: 25.50    Types: Cigarettes  . Smokeless tobacco: Never Used  Substance and Sexual Activity  . Alcohol use: No    Comment: stopped 08/2017   . Drug use: Yes    Types: Marijuana    Comment: daily, since age 62   . Sexual activity: Not Currently  Lifestyle  . Physical activity:    Days per week: 7 days    Minutes per session: 80 min  . Stress: Very much  Relationships  . Social connections:    Talks on phone: Never    Gets together: Never    Attends religious service: Never    Active member of club or organization: No    Attends meetings of clubs or organizations: Never    Relationship status: Living with partner  . Intimate partner violence:    Fear of current or ex partner: No    Emotionally abused: No    Physically abused: No    Forced sexual  activity: No  Other Topics Concern  . Not on file  Social History Narrative  . Not on file    FAMILY HISTORY: Family History  Adopted: Yes  Problem Relation Age of Onset  . Congestive Heart Failure Mother     ALLERGIES:  has No Known Allergies.  MEDICATIONS:  No current outpatient medications on file.   No current facility-administered medications for this visit.     REVIEW OF SYSTEMS:   Constitutional: Denies fevers, chills or abnormal night sweats (+) intentional weight loss Eyes: Denies blurriness of vision, double vision or watery eyes Ears, nose, mouth, throat, and face: Denies mucositis or sore throat Respiratory: Denies dyspnea or wheezes (+) dry cough secondary to recent URI Cardiovascular: Denies palpitation, chest discomfort or lower extremity swelling Gastrointestinal:  Denies nausea, vomiting, constipation, diarrhea, heartburn or change in bowel habits Skin: Denies abnormal skin rashes Lymphatics: Denies new lymphadenopathy or easy bruising Neurological:Denies numbness, tingling or new weaknesses Behavioral/Psych: Mood is stable, no new changes  BREAST: Denies nipple discharge (+) right breast: nipple inversion, swelling, bruising at biopsy sites (+) left breast: bruising at biopsy site  All other systems were reviewed with the patient and are negative.  PHYSICAL EXAMINATION: ECOG PERFORMANCE STATUS: 0 - Asymptomatic  Vitals:   10/08/17 1407  BP: (!) 171/83  Pulse: 73  Resp: 18  Temp: 98.4 F (36.9 C)  SpO2: 99%   Filed Weights   10/08/17 1407  Weight: 172 lb 9.6 oz (78.3 kg)    GENERAL:alert, no distress and comfortable SKIN: skin color, texture, turgor are normal, no rashes or significant lesions EYES: normal, conjunctiva are pink and non-injected, sclera clear OROPHARYNX:no exudate, no erythema and lips, buccal mucosa, and tongue normal  LYMPH:  no palpable cervical or supraclavicular lymphadenopathy LUNGS: clear to auscultation with normal  breathing effort HEART: regular rate & rhythm and no murmurs and no lower extremity edema ABDOMEN:abdomen soft, non-tender and normal bowel sounds Musculoskeletal:no cyanosis of digits and no clubbing  PSYCH: alert & oriented x 3 with fluent speech NEURO: no focal motor/sensory deficits BREAST: inspection shows right breast to be larger than left with nipple inversion and edema, no discharge. Right outer breast and axillary ecchymosis at biopsy sites. There is a large firm irregular mass measuring approximately 8 cm spanning from the upper outer to upper inner quadrants involving  the nipple. 1.5-2 cm palpable axillary lymph node. Left breast with ecchymosis at upper outer biopsy site, ? 1 cm area of soft tissue thickness at biopsy site but a discrete mass is not appreciated.   LABORATORY DATA:  I have reviewed the data as listed CBC Latest Ref Rng & Units 09/10/2017 09/08/2013 06/13/2013  WBC 3.4 - 10.8 x10E3/uL 8.0 4.9 15.4(A)  Hemoglobin 11.1 - 15.9 g/dL 14.6 14.6 14.7  Hematocrit 34.0 - 46.6 % 42.3 45.8 46.0  Platelets 150 - 379 x10E3/uL 225 - -   CMP Latest Ref Rng & Units 09/10/2017  Glucose 65 - 99 mg/dL 91  BUN 8 - 27 mg/dL 25  Creatinine 0.57 - 1.00 mg/dL 0.98  Sodium 134 - 144 mmol/L 143  Potassium 3.5 - 5.2 mmol/L 4.9  Chloride 96 - 106 mmol/L 106  CO2 20 - 29 mmol/L 18(L)  Calcium 8.7 - 10.3 mg/dL 9.5  Total Protein 6.0 - 8.5 g/dL 6.5  Total Bilirubin 0.0 - 1.2 mg/dL 0.4  Alkaline Phos 39 - 117 IU/L 68  AST 0 - 40 IU/L 19  ALT 0 - 32 IU/L 18    RADIOGRAPHIC STUDIES: I have personally reviewed the radiological images as listed and agreed with the findings in the report. Dg Chest 2 View  Result Date: 09/24/2017 CLINICAL DATA:  Cough EXAM: CHEST - 2 VIEW COMPARISON:  09/08/2013 FINDINGS: The heart size and mediastinal contours are within normal limits. Both lungs are clear. The visualized skeletal structures are unremarkable. IMPRESSION: No active cardiopulmonary disease.  Electronically Signed   By: Franchot Gallo M.D.   On: 09/24/2017 18:14   Mr Breast Bilateral W Wo Contrast Inc Cad  Result Date: 10/08/2017 CLINICAL DATA:  62 year old female with recently diagnosed bilateral breast invasive mammary carcinoma. Ultrasound-guided biopsy of a 7.5 cm mass in the right breast at the 11:30 position revealed grade 2 invasive mammary carcinoma and ultrasound-guided biopsy an abnormal lymph node in the right axilla revealed metastatic mammary carcinoma. Ultrasound-guided biopsy of a mass in the left breast at the 2 o'clock position revealed grade 1 invasive ductal carcinoma. LABS:  Not applicable. EXAM: BILATERAL BREAST MRI WITH AND WITHOUT CONTRAST TECHNIQUE: Multiplanar, multisequence MR images of both breasts were obtained prior to and following the intravenous administration of 16 ml of MultiHance. THREE-DIMENSIONAL MR IMAGE RENDERING ON INDEPENDENT WORKSTATION: Three-dimensional MR images were rendered by post-processing of the original MR data on an independent workstation. The three-dimensional MR images were interpreted, and findings are reported in the following complete MRI report for this study. Three dimensional images were evaluated at the independent DynaCad workstation COMPARISON:  Previous exam(s). FINDINGS: Breast composition: b.  Scattered fibroglandular tissue. Background parenchymal enhancement: Mild. Right breast: There is both skin and trabecular edema with enlargement of the right breast as well as nipple retraction. A biopsy marking clip is present in the upper-outer right breast at site of known biopsy proven malignancy, with the large irregular enhancing mass measuring 6.8 cm AP, 9.6 cm transverse, and 7.2 cm craniocaudal. This mass predominantly involves the anterior third of the central upper right breast (both upper outer and upper inner quadrants), however extends inferiorly to mainly involve the lower outer quadrant (subtracted image 88) and to a lesser  extent the lower inner quadrant (subtraction image 105). Left breast: Biopsy changes with clip artifact and small amount of enhancement present at site of biopsy proven malignancy in the upper-outer left breast, with this area measuring approximately 0.9 cm AP, 0.8 cm transverse and  0.7 cm craniocaudal. No additional abnormal areas of enhancement identified in the left breast. Lymph nodes: 5 morphologically abnormal and enlarged level 1 lymph nodes are present in the right axilla, the largest of which measures up to 3.4 cm (subtraction image 185). Biopsy marking clip is felt to be located in a lymph node located slightly more anterior inferior to the largest lymph node (image 22). No lymphadenopathy seen in the left axilla. No internal mammary lymphadenopathy. Ancillary findings:  None. IMPRESSION: 1. Biopsy proven malignancy involving all 4 quadrants of the right breast, although predominantly located in the anterior third of the central upper right breast measures 6.8 x 9.6 x 7.2 cm. 2. Five pathologic right axillary lymph nodes, compatible with biopsy proven axillary metastases. 3. Small mass with associated biopsy related changes in the upper-outer left breast at site of known malignancy measures 0.9 x 0.8 x 0.7 cm. No abnormal lymph nodes seen in the left axilla. RECOMMENDATION: Treatment plan for known bilateral breast malignancy. BI-RADS CATEGORY  6: Known biopsy-proven malignancy. Electronically Signed   By: Everlean Alstrom M.D.   On: 10/08/2017 10:02   US Breast Ltd Uni Left Inc Axilla  Result Date: 09/09/2017 CLINICAL DATA:  62 year old who sustained a severe pinching injury to the right breast in October, 2018, with severe bruising at that time. She presents now with a large palpable mass in the upper outer right breast and associated right nipple inversion. Annual evaluation, left breast. Patient states that it has been greater than 30 years since her previous mammogram. EXAM: DIGITAL DIAGNOSTIC  BILATERAL MAMMOGRAM WITH CAD AND TOMO LIMITED ULTRASOUND BILATERAL BREASTS COMPARISON:  None. ACR Breast Density Category c: The breast tissue is heterogeneously dense, which may obscure small masses. FINDINGS: Tomosynthesis and synthesized full field CC and MLO views of both breasts were obtained. Tomosynthesis and synthesized spot compression tangential view of the palpable concern in the right breast was also obtained. Corresponding to the palpable concern in the upper outer right breast is a large focal asymmetry measuring on the order of 3 x 5 cm, associated with architectural distortion and suspicious calcifications. In addition, there is diffuse trabecular thickening and skin thickening throughout the right breast. A possible pathologic right axillary lymph node is present as well. In the outer left breast at middle depth is a circumscribed low-density mass associated with microcalcifications measuring approximately 1 cm. No suspicious findings elsewhere in the left breast. Mammographic images were processed with CAD. On physical exam, there is a large palpable firm mass involving the entire outer and upper right breast on the order of 7-8 cm or so in size. There is evidence of retraction of the right nipple. The skin of the lower right breast in the dependent portion has a "peau d'orange" appearance. Targeted right breast ultrasound is performed, showing a very large hypoechoic mass with irregular margins extending from the approximate 9 o'clock position through the 12 to 1 o'clock position. On the CC gait image, the mass measures maximally approximately 7.5 cm. The mass has irregular margins, demonstrates acoustic shadowing, and demonstrates internal power Doppler flow. The mass extends into the retroareolar region and involves the nipple, accounting for the nipple retraction. Separate from the dominant mass at the 7 o'clock position approximately 4 cm from nipple is a hypoechoic antiparallel mass with  irregular margins measuring approximately 1.4 x 2.7 x 1.7 cm. Separate from the dominant mass at the 1 o'clock position approximately 2 cm from nipple is a hypoechoic mass with irregular margins measuring  approximately 1.7 x 0.5 x 1.6 cm. Both of these masses also demonstrate internal power Doppler flow. Sonographic evaluation of the right axilla demonstrates 2 adjacent pathologic lymph nodes, the larger measuring approximately 2.5 x 2.5 x 2.8 cm, the smaller measuring approximately 2.0 x 0.8 x 2.7 cm. Targeted left breast ultrasound is performed, showing an oval parallel hypoechoic mass containing microcalcifications at the 2 o'clock position approximately 6 cm from nipple measuring approximately 0.9 x 0.5 x 0.9 cm, corresponding to the mammographic finding. Sonographic evaluation of the left axilla demonstrates no pathologic lymphadenopathy. IMPRESSION: 1. Highly suspicious large right breast mass involving the outer and upper breast extending from approximately 9 o'clock through 12-1 o'clock, maximum measurement approximating 7.5 cm. The mass involves the right nipple, accounting for nipple retraction. Architectural distortion, microcalcifications and diffuse trabecular thickening and skin thickening are associated with the large mass, raising the possibility of this representing an inflammatory cancer. 2. Satellite masses separate from the dominant mass involving the lower outer quadrant and the upper inner quadrant of the right breast, measured above. 3. Two adjacent pathologic right axillary lymph nodes. 4. Indeterminate 0.9 cm solid mass involving the upper outer quadrant of the left breast which accounts for a mammographic finding. 5. No pathologic left axillary lymphadenopathy. RECOMMENDATION: Ultrasound-guided core needle biopsy of the dominant right breast mass, the larger of the pathologic right axillary lymph nodes, and the solid left breast mass. The ultrasound core needle biopsy procedure was  discussed with patient and her questions were answered. As she is a self pay patient, she is going to contact the [place] program to determine if she qualifies for financial assistance. The biopsies will be scheduled thereafter. I have discussed the findings and recommendations with the patient. Results were also provided in writing at the conclusion of the visit. BI-RADS CATEGORY  5: Highly suggestive of malignancy. Electronically Signed   By: Evangeline Dakin M.D.   On: 09/09/2017 16:55   US Breast Ltd Uni Right Inc Axilla  Result Date: 09/09/2017 CLINICAL DATA:  62 year old who sustained a severe pinching injury to the right breast in October, 2018, with severe bruising at that time. She presents now with a large palpable mass in the upper outer right breast and associated right nipple inversion. Annual evaluation, left breast. Patient states that it has been greater than 30 years since her previous mammogram. EXAM: DIGITAL DIAGNOSTIC BILATERAL MAMMOGRAM WITH CAD AND TOMO LIMITED ULTRASOUND BILATERAL BREASTS COMPARISON:  None. ACR Breast Density Category c: The breast tissue is heterogeneously dense, which may obscure small masses. FINDINGS: Tomosynthesis and synthesized full field CC and MLO views of both breasts were obtained. Tomosynthesis and synthesized spot compression tangential view of the palpable concern in the right breast was also obtained. Corresponding to the palpable concern in the upper outer right breast is a large focal asymmetry measuring on the order of 3 x 5 cm, associated with architectural distortion and suspicious calcifications. In addition, there is diffuse trabecular thickening and skin thickening throughout the right breast. A possible pathologic right axillary lymph node is present as well. In the outer left breast at middle depth is a circumscribed low-density mass associated with microcalcifications measuring approximately 1 cm. No suspicious findings elsewhere in the left breast.  Mammographic images were processed with CAD. On physical exam, there is a large palpable firm mass involving the entire outer and upper right breast on the order of 7-8 cm or so in size. There is evidence of retraction of the right nipple. The  skin of the lower right breast in the dependent portion has a "peau d'orange" appearance. Targeted right breast ultrasound is performed, showing a very large hypoechoic mass with irregular margins extending from the approximate 9 o'clock position through the 12 to 1 o'clock position. On the CC gait image, the mass measures maximally approximately 7.5 cm. The mass has irregular margins, demonstrates acoustic shadowing, and demonstrates internal power Doppler flow. The mass extends into the retroareolar region and involves the nipple, accounting for the nipple retraction. Separate from the dominant mass at the 7 o'clock position approximately 4 cm from nipple is a hypoechoic antiparallel mass with irregular margins measuring approximately 1.4 x 2.7 x 1.7 cm. Separate from the dominant mass at the 1 o'clock position approximately 2 cm from nipple is a hypoechoic mass with irregular margins measuring approximately 1.7 x 0.5 x 1.6 cm. Both of these masses also demonstrate internal power Doppler flow. Sonographic evaluation of the right axilla demonstrates 2 adjacent pathologic lymph nodes, the larger measuring approximately 2.5 x 2.5 x 2.8 cm, the smaller measuring approximately 2.0 x 0.8 x 2.7 cm. Targeted left breast ultrasound is performed, showing an oval parallel hypoechoic mass containing microcalcifications at the 2 o'clock position approximately 6 cm from nipple measuring approximately 0.9 x 0.5 x 0.9 cm, corresponding to the mammographic finding. Sonographic evaluation of the left axilla demonstrates no pathologic lymphadenopathy. IMPRESSION: 1. Highly suspicious large right breast mass involving the outer and upper breast extending from approximately 9 o'clock through  12-1 o'clock, maximum measurement approximating 7.5 cm. The mass involves the right nipple, accounting for nipple retraction. Architectural distortion, microcalcifications and diffuse trabecular thickening and skin thickening are associated with the large mass, raising the possibility of this representing an inflammatory cancer. 2. Satellite masses separate from the dominant mass involving the lower outer quadrant and the upper inner quadrant of the right breast, measured above. 3. Two adjacent pathologic right axillary lymph nodes. 4. Indeterminate 0.9 cm solid mass involving the upper outer quadrant of the left breast which accounts for a mammographic finding. 5. No pathologic left axillary lymphadenopathy. RECOMMENDATION: Ultrasound-guided core needle biopsy of the dominant right breast mass, the larger of the pathologic right axillary lymph nodes, and the solid left breast mass. The ultrasound core needle biopsy procedure was discussed with patient and her questions were answered. As she is a self pay patient, she is going to contact the [place] program to determine if she qualifies for financial assistance. The biopsies will be scheduled thereafter. I have discussed the findings and recommendations with the patient. Results were also provided in writing at the conclusion of the visit. BI-RADS CATEGORY  5: Highly suggestive of malignancy. Electronically Signed   By: Evangeline Dakin M.D.   On: 09/09/2017 16:55   Mm Diag Breast Tomo Bilateral  Result Date: 09/09/2017 CLINICAL DATA:  62 year old who sustained a severe pinching injury to the right breast in October, 2018, with severe bruising at that time. She presents now with a large palpable mass in the upper outer right breast and associated right nipple inversion. Annual evaluation, left breast. Patient states that it has been greater than 30 years since her previous mammogram. EXAM: DIGITAL DIAGNOSTIC BILATERAL MAMMOGRAM WITH CAD AND TOMO LIMITED ULTRASOUND  BILATERAL BREASTS COMPARISON:  None. ACR Breast Density Category c: The breast tissue is heterogeneously dense, which may obscure small masses. FINDINGS: Tomosynthesis and synthesized full field CC and MLO views of both breasts were obtained. Tomosynthesis and synthesized spot compression tangential view of the  palpable concern in the right breast was also obtained. Corresponding to the palpable concern in the upper outer right breast is a large focal asymmetry measuring on the order of 3 x 5 cm, associated with architectural distortion and suspicious calcifications. In addition, there is diffuse trabecular thickening and skin thickening throughout the right breast. A possible pathologic right axillary lymph node is present as well. In the outer left breast at middle depth is a circumscribed low-density mass associated with microcalcifications measuring approximately 1 cm. No suspicious findings elsewhere in the left breast. Mammographic images were processed with CAD. On physical exam, there is a large palpable firm mass involving the entire outer and upper right breast on the order of 7-8 cm or so in size. There is evidence of retraction of the right nipple. The skin of the lower right breast in the dependent portion has a "peau d'orange" appearance. Targeted right breast ultrasound is performed, showing a very large hypoechoic mass with irregular margins extending from the approximate 9 o'clock position through the 12 to 1 o'clock position. On the CC gait image, the mass measures maximally approximately 7.5 cm. The mass has irregular margins, demonstrates acoustic shadowing, and demonstrates internal power Doppler flow. The mass extends into the retroareolar region and involves the nipple, accounting for the nipple retraction. Separate from the dominant mass at the 7 o'clock position approximately 4 cm from nipple is a hypoechoic antiparallel mass with irregular margins measuring approximately 1.4 x 2.7 x 1.7 cm.  Separate from the dominant mass at the 1 o'clock position approximately 2 cm from nipple is a hypoechoic mass with irregular margins measuring approximately 1.7 x 0.5 x 1.6 cm. Both of these masses also demonstrate internal power Doppler flow. Sonographic evaluation of the right axilla demonstrates 2 adjacent pathologic lymph nodes, the larger measuring approximately 2.5 x 2.5 x 2.8 cm, the smaller measuring approximately 2.0 x 0.8 x 2.7 cm. Targeted left breast ultrasound is performed, showing an oval parallel hypoechoic mass containing microcalcifications at the 2 o'clock position approximately 6 cm from nipple measuring approximately 0.9 x 0.5 x 0.9 cm, corresponding to the mammographic finding. Sonographic evaluation of the left axilla demonstrates no pathologic lymphadenopathy. IMPRESSION: 1. Highly suspicious large right breast mass involving the outer and upper breast extending from approximately 9 o'clock through 12-1 o'clock, maximum measurement approximating 7.5 cm. The mass involves the right nipple, accounting for nipple retraction. Architectural distortion, microcalcifications and diffuse trabecular thickening and skin thickening are associated with the large mass, raising the possibility of this representing an inflammatory cancer. 2. Satellite masses separate from the dominant mass involving the lower outer quadrant and the upper inner quadrant of the right breast, measured above. 3. Two adjacent pathologic right axillary lymph nodes. 4. Indeterminate 0.9 cm solid mass involving the upper outer quadrant of the left breast which accounts for a mammographic finding. 5. No pathologic left axillary lymphadenopathy. RECOMMENDATION: Ultrasound-guided core needle biopsy of the dominant right breast mass, the larger of the pathologic right axillary lymph nodes, and the solid left breast mass. The ultrasound core needle biopsy procedure was discussed with patient and her questions were answered. As she is a  self pay patient, she is going to contact the [place] program to determine if she qualifies for financial assistance. The biopsies will be scheduled thereafter. I have discussed the findings and recommendations with the patient. Results were also provided in writing at the conclusion of the visit. BI-RADS CATEGORY  5: Highly suggestive of malignancy. Electronically Signed  By: Evangeline Dakin M.D.   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 This was found to be concordant by Dr. Franki Cabot. 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 This was found to be concordant by Dr. Franki Cabot. Pathology results were discussed with the patient by telephone. The patient reported doing well after the biopsies with tenderness at the sites. Post biopsy instructions and care were reviewed and questions were answered. The patient was encouraged to call [facility] for any additional concerns. Surgical consultation has been arranged with Dr. [name] at [facility] on September 29, 2017. Pathology results reported by Terie Purser, RN on 09/25/2017. Electronically Signed   By: Franki Cabot M.D.   On: 09/25/2017 08:08   Result Date: 09/25/2017 CLINICAL DATA:  Patient with highly suspicious large RIGHT breast mass involving the outer and upper RIGHT breast, 9:00 to 12:00 to 1 o'clock axes, measuring at least 7 cm greatest dimension with associated involvement of the RIGHT nipple, presenting today for ultrasound-guided core biopsy. Patient also with enlarged lymph nodes in the RIGHT axilla presenting today for ultrasound-guided core biopsy. Lastly, patient also with an indeterminate 9 mm solid mass within the upper-outer  quadrant of the LEFT breast for which patient also presents today for ultrasound-guided core biopsy. EXAM: ULTRASOUND GUIDED RIGHT BREAST CORE NEEDLE BIOPSY COMPARISON:  Previous exam(s). PROCEDURE: I met with the patient and we discussed the procedure of ultrasound-guided biopsy, including benefits and alternatives. We discussed the high likelihood of a successful procedure. We discussed the risks of the procedure including infection, bleeding, tissue injury, clip migration, and inadequate sampling. Informed written consent was given. The usual time-out protocol was performed immediately prior to the procedure. Site 1: Using sterile technique and 1% Lidocaine as local anesthetic, under direct ultrasound visualization, a 12 gauge spring-loaded device was used to perform biopsy of the LEFT breast mass at the 2 o'clock axisusing a lateral approach. At the conclusion of the procedure, a ribbon shaped tissue marker clip was deployed into the biopsy cavity. Lesion quadrant: Upper outer quadrant Site 2: Next, using sterile technique and 1% lidocaine as local anesthetic, under direct ultrasound visualization, a 12 gauge spring-loaded device was used to perform biopsy of the RIGHT breast mass at the 11:30 o'clock axis using a lateral approach. At the conclusion of the procedure, a heart shaped tissue marker clip was deployed in the biopsy cavity. Lesion quadrant: Upper outer quadrant Site 3: Next, using sterile technique and 1% lidocaine as local anesthetic, under direct ultrasound visualization, a 14 gauge spring-loaded device was used to perform biopsy of an enlarged RIGHT axillary lymph node using a lateral approach. At the conclusion of the procedure, a spiral shaped marker clip was deployed in the biopsy cavity. IMPRESSION: 1. Ultrasound-guided biopsy of the LEFT breast mass at the 2 o'clock axis. Ribbon shaped clip placed at the biopsy site. 2. Ultrasound-guided biopsy of the RIGHT breast mass at the 11:30 o'clock  axis. Heart shaped clip placed at the biopsy site. 3. Ultrasound-guided biopsy of an enlarged lymph node in the RIGHT axilla. Spiral shaped clip placed at the biopsy site. No apparent complications. Electronically Signed: By: Franki Cabot M.D. On: 09/23/2017 15:44   Mm Clip Placement Left  Result Date: 09/23/2017 CLINICAL DATA:  Status post ultrasound-guided biopsy of a LEFT breast mass at the 2 o'clock axis. EXAM: DIAGNOSTIC LEFT MAMMOGRAM POST ULTRASOUND BIOPSY COMPARISON:  Previous exam(s). FINDINGS: Mammographic images were obtained following ultrasound guided biopsy of the LEFT breast mass at the 2 o'clock axis. Ribbon shaped clip is appropriately positioned within the mass in the upper-outer quadrant. IMPRESSION: Ribbon shaped biopsy clip is appropriately positioned within the biopsied mass in the upper-outer quadrant of the LEFT breast, 2 o'clock axis. Final Assessment: Post Procedure Mammograms for Marker Placement Electronically Signed   By: Franki Cabot M.D.   On: 09/23/2017 16:03   Mm Clip Placement Right  Result Date: 09/23/2017 CLINICAL DATA:  Status post biopsy of a large highly suspicious mass in the upper-outer quadrant the RIGHT breast and an enlarged lymph node in the RIGHT axilla. EXAM: DIAGNOSTIC RIGHT MAMMOGRAM POST ULTRASOUND BIOPSY COMPARISON:  Previous exam(s). FINDINGS: Mammographic images were obtained following ultrasound guided biopsy of the large highly suspicious mass in the upper-outer quadrant of the RIGHT breast. At the conclusion the procedure, a heart shaped clip was placed at the biopsy site. This clip is appropriately positioned in the mass. A coil shaped clip is well-positioned within the biopsied lymph node in the RIGHT axilla, as seen on postprocedure ultrasound. IMPRESSION: 1. Heart shaped clip appropriately positioned within the targeted mass in the upper-outer quadrant of the RIGHT breast. 2. Coil shaped clip is appropriately positioned within the targeted  abnormal lymph node in the RIGHT axilla, as seen on postprocedure ultrasound. Final Assessment: Post Procedure Mammograms for Marker Placement Electronically Signed   By: Franki Cabot M.D.   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 This was found to be concordant by Dr. Franki Cabot. 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 This was found to be concordant by Dr. Franki Cabot. Pathology results were discussed with the patient by telephone. The patient reported doing well after the biopsies with tenderness at the sites. Post biopsy instructions and care were reviewed and questions were answered. The patient was encouraged to call [facility] for any additional concerns. Surgical consultation has been arranged with Dr. [name] at [facility] on September 29, 2017. Pathology results reported by Terie Purser, RN on 09/25/2017. Electronically Signed   By: Franki Cabot M.D.   On: 09/25/2017 08:08   Result Date: 09/25/2017 CLINICAL DATA:  Patient with highly suspicious large RIGHT breast mass involving the outer and upper RIGHT breast, 9:00 to 12:00 to 1 o'clock axes, measuring at least 7 cm greatest dimension with associated involvement of the RIGHT nipple, presenting today for ultrasound-guided core biopsy. Patient also with enlarged lymph nodes in the RIGHT axilla presenting today for ultrasound-guided core biopsy. Lastly, patient also with an indeterminate 9 mm solid mass within the upper-outer quadrant of the LEFT breast for which patient also presents today for ultrasound-guided core biopsy. EXAM: ULTRASOUND GUIDED RIGHT BREAST CORE NEEDLE BIOPSY COMPARISON:  Previous exam(s).  PROCEDURE: I met with the patient and we discussed the procedure of ultrasound-guided biopsy, including benefits and alternatives. We discussed the high likelihood of a successful procedure. We discussed the risks of the procedure including infection, bleeding,  tissue injury, clip migration, and inadequate sampling. Informed written consent was given. The usual time-out protocol was performed immediately prior to the procedure. Site 1: Using sterile technique and 1% Lidocaine as local anesthetic, under direct ultrasound visualization, a 12 gauge spring-loaded device was used to perform biopsy of the LEFT breast mass at the 2 o'clock axisusing a lateral approach. At the conclusion of the procedure, a ribbon shaped tissue marker clip was deployed into the biopsy cavity. Lesion quadrant: Upper outer quadrant Site 2: Next, using sterile technique and 1% lidocaine as local anesthetic, under direct ultrasound visualization, a 12 gauge spring-loaded device was used to perform biopsy of the RIGHT breast mass at the 11:30 o'clock axis using a lateral approach. At the conclusion of the procedure, a heart shaped tissue marker clip was deployed in the biopsy cavity. Lesion quadrant: Upper outer quadrant Site 3: Next, using sterile technique and 1% lidocaine as local anesthetic, under direct ultrasound visualization, a 14 gauge spring-loaded device was used to perform biopsy of an enlarged RIGHT axillary lymph node using a lateral approach. At the conclusion of the procedure, a spiral shaped marker clip was deployed in the biopsy cavity. IMPRESSION: 1. Ultrasound-guided biopsy of the LEFT breast mass at the 2 o'clock axis. Ribbon shaped clip placed at the biopsy site. 2. Ultrasound-guided biopsy of the RIGHT breast mass at the 11:30 o'clock axis. Heart shaped clip placed at the biopsy site. 3. Ultrasound-guided biopsy of an enlarged lymph node in the RIGHT axilla. Spiral shaped clip placed at the biopsy site. No apparent  complications. Electronically Signed: By: Franki Cabot M.D. 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 This was found to be concordant by Dr. Franki Cabot. 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 This was found to be concordant by Dr. Franki Cabot. Pathology results were discussed with the patient by telephone. The patient reported doing well after the biopsies with tenderness at the sites. Post biopsy instructions and care were reviewed and questions were answered. The patient was encouraged to call [facility] for any additional concerns. Surgical consultation has been arranged with Dr. [name] at [facility] on September 29, 2017. Pathology results reported by Terie Purser, RN on 09/25/2017. Electronically Signed   By: Franki Cabot M.D.   On: 09/25/2017 08:08   Result Date: 09/25/2017 CLINICAL DATA:  Patient with highly suspicious large RIGHT breast mass involving the outer and upper RIGHT breast, 9:00 to 12:00 to 1 o'clock axes, measuring at least 7 cm greatest dimension with associated involvement of the RIGHT nipple, presenting today for ultrasound-guided core biopsy. Patient also with enlarged lymph nodes in the RIGHT axilla presenting today for ultrasound-guided core biopsy. Lastly, patient also with an indeterminate 9 mm solid mass within the upper-outer quadrant of the LEFT breast for which patient also presents today for ultrasound-guided core biopsy. EXAM: ULTRASOUND GUIDED RIGHT BREAST CORE NEEDLE BIOPSY COMPARISON:  Previous exam(s). PROCEDURE: I met with the patient and we discussed the procedure of ultrasound-guided biopsy, including benefits and alternatives. We  discussed the high likelihood of a successful procedure. We discussed the risks of the procedure including infection, bleeding, tissue injury, clip migration, and inadequate sampling. Informed written  consent was given. The usual time-out protocol was performed immediately prior to the procedure. Site 1: Using sterile technique and 1% Lidocaine as local anesthetic, under direct ultrasound visualization, a 12 gauge spring-loaded device was used to perform biopsy of the LEFT breast mass at the 2 o'clock axisusing a lateral approach. At the conclusion of the procedure, a ribbon shaped tissue marker clip was deployed into the biopsy cavity. Lesion quadrant: Upper outer quadrant Site 2: Next, using sterile technique and 1% lidocaine as local anesthetic, under direct ultrasound visualization, a 12 gauge spring-loaded device was used to perform biopsy of the RIGHT breast mass at the 11:30 o'clock axis using a lateral approach. At the conclusion of the procedure, a heart shaped tissue marker clip was deployed in the biopsy cavity. Lesion quadrant: Upper outer quadrant Site 3: Next, using sterile technique and 1% lidocaine as local anesthetic, under direct ultrasound visualization, a 14 gauge spring-loaded device was used to perform biopsy of an enlarged RIGHT axillary lymph node using a lateral approach. At the conclusion of the procedure, a spiral shaped marker clip was deployed in the biopsy cavity. IMPRESSION: 1. Ultrasound-guided biopsy of the LEFT breast mass at the 2 o'clock axis. Ribbon shaped clip placed at the biopsy site. 2. Ultrasound-guided biopsy of the RIGHT breast mass at the 11:30 o'clock axis. Heart shaped clip placed at the biopsy site. 3. Ultrasound-guided biopsy of an enlarged lymph node in the RIGHT axilla. Spiral shaped clip placed at the biopsy site. No apparent complications. Electronically Signed: By: Franki Cabot M.D. On: 09/23/2017 15:44    ASSESSMENT & PLAN: 62 year old female with history of  substance use presented with right nipple inversion for 3 months.   1. 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 She has locally advanced right breast cancer with a dominant 9.6 cm mass involving all 4 quadrants of the breast involving 5 pathologic axillary lymph nodes. She has a small left side breast cancer as well. [name] recommends staging work up with CT CAP and bone scan to rule out distant metastasis. She saw Dr. Dalbert Batman who discussed surgical options, including right mastectomy and possible left breast conserving surgery with subsequent radiation. She will meet with Dr. Lisbeth Renshaw this week. [name] discussed the advantages of neoadjuvant chemotherapy and recommends AC x4 followed by weekly taxol x12 cycles. She is active and relatively healthy, she appears to be a good candidate for chemotherapy  -Chemotherapy consent: Side effects including but not not limited to fatigue, nausea, vomiting, diarrhea, hair loss, neuropathy, fluid retention, renal and kidney dysfunction, neutropenic fever, need for blood transfusion, bleeding, and cardiac toxicity were discussed with patient in great detail. She will get growth factor after AC, we discussed indication, side effects including bone pain, and management. She agrees to proceed. -Given her ER positive disease, she is a candidate for anti-estrogen therapy in the future. Will discuss further when the time is relevant.  -Will request Dr. Dalbert Batman to place [facility]. She will get staging work up and baseline echo prior to treatment.  -she is a potential candidate for UPBEAT study and exact science study. She is interested in learning more  information but not today, I reached  out to our research team to f/u with her.  -She will attend chemo class, we encouraged her to bring significant other to class  -She has lost weight intentionally lately, she was encouraged to stop active weight loss in preparation for chemotherapy. We recommend to eat well and be active. Will refer to dietician if needed -Plan to complete studies within the next 1-2 weeks to begin first cycle AC in 2 weeks, the patient agrees to the plan.  -F/u in 2 weeks with first cycle  2. Social support -She works 60 hours per week at her physically demanding job. I informed her she may need to take time off during her intensive chemotherapy schedule. She plans to apply for medicaid. I referred her to social work today for ongoing f/u.   3. Substance use -She has been smoking cigarettes and marijuana for decades. I encouraged her to quit today, and explained the benefits of not smoking. She has been weaning herself off cigarettes, down to 1/2 PPD; she will continue to try.  -She mentioned interest in herbal and holistic diet, I recommend she avoid herbal supplements while on chemotherapy and endocrine therapy due to potential drug interactions, she understands.   PLAN: -Echo, chemo class, PAC placement, CT CAP, bone scan in 1-2 weeks -F/u in 2 weeks with first cycle AC -Referral to SW, financial advocate   Orders Placed This Encounter  Procedures  . CT Abdomen Pelvis W Contrast    Standing Status:   Future    Standing Expiration Date:   10/08/2018    Order Specific Question:   If indicated for the ordered procedure, I authorize the administration of contrast media per Radiology protocol    Answer:   Yes    Order Specific Question:   Preferred imaging location?    Answer:   [facility]    Order Specific Question:   Is Oral Contrast requested for this exam?    Answer:   Yes, Per Radiology protocol    Order Specific Question:   Radiology Contrast Protocol -  do NOT remove file path    Answer:   \\[email or web address]\CTProtocols.pdf  . CT Chest W Contrast    Standing Status:   Future    Standing Expiration Date:   10/08/2018    Order Specific Question:   If indicated for the ordered procedure, I authorize the administration of contrast media per Radiology protocol    Answer:   Yes    Order Specific Question:   Preferred imaging location?    Answer:   [facility]    Order Specific Question:   Radiology Contrast Protocol - do NOT remove file path    Answer:   \\[email or web address].pdf  . NM Bone Scan Whole Body    Standing Status:   Future    Standing Expiration Date:   10/08/2018    Order Specific Question:   If indicated for the ordered procedure, I authorize the administration of a radiopharmaceutical per Radiology protocol    Answer:   Yes    Order Specific Question:   Preferred imaging location?    Answer:   [facility]    Order Specific Question:   Radiology Contrast Protocol - do NOT remove file path    Answer:   \\[email or web address]  . CBC with Differential ([facility] Only)    Standing Status:   Standing    Number of Occurrences:   100    Standing Expiration Date:   10/09/2023  . CMP (  [place] only)    Standing Status:   Standing    Number of Occurrences:   100    Standing Expiration Date:   10/09/2023  . Ambulatory referral to Social Work    Referral Priority:   Routine    Referral Type:   Consultation    Referral Reason:   Specialty Services Required    Number of Visits Requested:   1  . ECHOCARDIOGRAM COMPLETE    Standing Status:   Future    Standing Expiration Date:   01/09/2019    Order Specific Question:   Where should this test be performed    Answer:   [facility]    Order Specific Question:   Perflutren DEFINITY (image enhancing agent) should be administered unless hypersensitivity or allergy exist    Answer:   Administer Perflutren    Order  Specific Question:   Expected Date:    Answer:   1 week    All questions were answered. The patient knows to call the clinic with any problems, questions or concerns. I spent 45 minutes counseling the patient face to face. The total time spent in the appointment was 60 minutes and more than 50% was on counseling.     Alla Feeling, NP 10/09/2017   Addendum  I have seen the patient, examined her. I agree with the assessment and and plan and have edited the notes.   Darlene Beasley is a 62 yo female with history of multiple substance abuse, including alcohol, smoking, marijuana, presented with a palpable large right breast mass.  I have reviewed her mammogram, ultrasound and breast MRI and biopsy results with the patient in details.  She unfortunately has a 9.6 cm mass involving all 4 quadrants of the right breast, with associated 5 pathological right axillary lymph nodes, biopsy confirmed invasive ductal carcinoma, ER strongly positive, PR negative, HER-2 negative with Ki-67 30%.  She also has a small left breast in invasive ductal carcinoma, ER/PR positive, HER-2 negative.  Given the locally advanced right breast cancer, she is at very high risk for recurrence.  I recommend neoadjuvant chemotherapy with dose dense Adriamycin and Cytoxan (AC) every 2 weeks for 4 cycles, followed by weekly Taxol for 12 weeks.  We also discussed the option of adjuvant chemotherapy, and the benefit AND advantage of neoadjuvant chemo.  She agrees with the recommendation.  Chemo consent was obtained.  We will get staging CT chest, abdomen and pelvis with contrast, and a bone scan, to rule out distant metastasis.  We will also obtain echocardiogram, port placement and chemo class. Plan to start chemo in 1-2 weeks. SW referral. Discussed clinical trial options (exact science and upbeat study . All questions were answered.

## 2017-10-09 ENCOUNTER — Encounter: Payer: Self-pay | Admitting: General Practice

## 2017-10-09 ENCOUNTER — Other Ambulatory Visit: Payer: Self-pay | Admitting: Hematology

## 2017-10-09 ENCOUNTER — Encounter: Payer: Self-pay | Admitting: Nurse Practitioner

## 2017-10-09 ENCOUNTER — Telehealth: Payer: Self-pay

## 2017-10-09 ENCOUNTER — Other Ambulatory Visit: Payer: Self-pay | Admitting: Obstetrics and Gynecology

## 2017-10-09 ENCOUNTER — Encounter: Payer: Self-pay | Admitting: *Deleted

## 2017-10-09 ENCOUNTER — Inpatient Hospital Stay: Payer: Medicaid Other

## 2017-10-09 ENCOUNTER — Other Ambulatory Visit: Payer: Self-pay | Admitting: General Surgery

## 2017-10-09 DIAGNOSIS — C50919 Malignant neoplasm of unspecified site of unspecified female breast: Secondary | ICD-10-CM

## 2017-10-09 DIAGNOSIS — C773 Secondary and unspecified malignant neoplasm of axilla and upper limb lymph nodes: Principal | ICD-10-CM

## 2017-10-09 DIAGNOSIS — C50811 Malignant neoplasm of overlapping sites of right female breast: Secondary | ICD-10-CM

## 2017-10-09 DIAGNOSIS — Z170 Estrogen receptor positive status [ER+]: Principal | ICD-10-CM

## 2017-10-09 MED ORDER — ONDANSETRON HCL 8 MG PO TABS: 8.0000 mg | ORAL_TABLET | Freq: Two times a day (BID) | ORAL | 1 refills | Status: DC | PRN

## 2017-10-09 MED ORDER — PROCHLORPERAZINE MALEATE 10 MG PO TABS: 10.0000 mg | ORAL_TABLET | Freq: Four times a day (QID) | ORAL | 1 refills | Status: DC | PRN

## 2017-10-09 MED ORDER — LIDOCAINE-PRILOCAINE 2.5-2.5 % EX CREA: TOPICAL_CREAM | CUTANEOUS | 3 refills | Status: DC

## 2017-10-09 NOTE — Telephone Encounter (Signed)
Spoke with patient concerning upcoming appointment schedule. Asked patient to be aware of SW and Electrical engineer. To call soon. (I called and left patient MR# and name on both answering machine). Will print schedule today after chemo class. Per 5/1 los

## 2017-10-09 NOTE — Progress Notes (Signed)
START ON PATHWAY REGIMEN - Breast   Doxorubicin + Cyclophosphamide (AC):   A cycle is every 21 days:     Doxorubicin      Cyclophosphamide   **Always confirm dose/schedule in your pharmacy ordering system**    Paclitaxel 80 mg/m2 Weekly:   Administer weekly:     Paclitaxel   **Always confirm dose/schedule in your pharmacy ordering system**    Patient Characteristics: Preoperative or Nonsurgical Candidate (Clinical Staging), Neoadjuvant Therapy followed by Surgery, Invasive Disease, Chemotherapy, HER2 Negative/Unknown/Equivocal, ER Positive Therapeutic Status: Preoperative or Nonsurgical Candidate (Clinical Staging) AJCC M Category: cM0 AJCC Grade: G2 Breast Surgical Plan: Neoadjuvant Therapy followed by Surgery ER Status: Positive (+) AJCC 8 Stage Grouping: IIIA HER2 Status: Negative (-) AJCC T Category: cT3 AJCC N Category: cN1 PR Status: Negative (-) Intent of Therapy: Curative Intent, Discussed with Patient 

## 2017-10-09 NOTE — Progress Notes (Signed)
[facility] CSW Progress Notes  Referral received from MD to contact patient to assess needs.  Spoke w patient, has appt w financial counseling re needs.  Wants to work as much as possible during treatment.  No concerns re transportation or care needs at home.  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  Will mail packet of information on [place] and stand by to assist as needed.  Edwyna Shell, LCSW Clinical Social Worker Phone:  [phone]

## 2017-10-10 ENCOUNTER — Encounter: Payer: Self-pay | Admitting: *Deleted

## 2017-10-10 ENCOUNTER — Other Ambulatory Visit: Payer: Self-pay | Admitting: General Surgery

## 2017-10-10 ENCOUNTER — Other Ambulatory Visit: Payer: Self-pay

## 2017-10-10 DIAGNOSIS — C50919 Malignant neoplasm of unspecified site of unspecified female breast: Secondary | ICD-10-CM

## 2017-10-10 DIAGNOSIS — C773 Secondary and unspecified malignant neoplasm of axilla and upper limb lymph nodes: Principal | ICD-10-CM

## 2017-10-13 ENCOUNTER — Ambulatory Visit
Admission: RE | Admit: 2017-10-13 | Discharge: 2017-10-13 | Disposition: A | Discharge status: Home / Self Care | Payer: No Typology Code available for payment source | Source: Ambulatory Visit | Attending: General Surgery | Admitting: General Surgery

## 2017-10-13 ENCOUNTER — Encounter: Payer: Self-pay | Admitting: Hematology

## 2017-10-13 ENCOUNTER — Ambulatory Visit: Payer: Self-pay

## 2017-10-13 DIAGNOSIS — C50919 Malignant neoplasm of unspecified site of unspecified female breast: Secondary | ICD-10-CM

## 2017-10-13 DIAGNOSIS — C773 Secondary and unspecified malignant neoplasm of axilla and upper limb lymph nodes: Principal | ICD-10-CM

## 2017-10-13 NOTE — Progress Notes (Signed)
Met with patient to introduce myself as Arboriculturist in person.  Discussed financial resources. Patient is in [facility] program and will be applying for [facility] tomorrow.  Discussed Advertising account executive. Patient will bring proof of income tomorrow to apply for grant. Household of 2 and her income is the only income.  Gave her my card for any additional financial questions or concerns.

## 2017-10-13 NOTE — Progress Notes (Signed)
Location of Breast Cancer:  Cancer of overlapping sites of right female breast: Invasive ductal carcinoma, metastatic to axillary lymph nodes  Malignant neoplasm of left breast in female, estrogen receptor positive ([place]): Invasive Ductal Carcinoma  Did patient present with symptoms (if so, please note symptoms) or was this found on screening mammography?: Patient pinched her breast at work.  Patient noticed right nipple inversion in January 2019.  Breast MRI 10/07/2017  IMPRESSION: 1. Biopsy proven malignancy involving all 4 quadrants of the right breast, although predominantly located in the anterior third of the central upper right breast measures 6.8 x 9.6 x 7.2 cm.  2. Five pathologic right axillary lymph nodes, compatible with biopsy proven axillary metastases.  3. Small mass with associated biopsy related changes in the upper-outer left breast at site of known malignancy measures 0.9 x 0.8 x 0.7 cm. No abnormal lymph nodes seen in the left axilla.  RECOMMENDATION: Treatment plan for known bilateral breast malignancy.  BI-RADS CATEGORY 6: Known biopsy-proven malignancy.     Mammogram 09/09/2017:   IMPRESSION: 1. Highly suspicious large right breast mass involving the outer and upper breast extending from approximately 9 o'clock through 12-1 o'clock, maximum measurement approximating 7.5 cm. The mass involves the right nipple, accounting for nipple retraction. Architectural distortion, microcalcifications and diffuse trabecular thickening and skin thickening are associated with the large mass, raising the possibility of this representing an inflammatory cancer. 2. Satellite masses separate from the dominant mass involving the lower outer quadrant and the upper inner quadrant of the right breast, measured above. 3. Two adjacent pathologic right axillary lymph nodes. 4. Indeterminate 0.9 cm solid mass involving the upper outer quadrant of the left breast which accounts for a  mammographic finding. 5. No pathologic left axillary lymphadenopathy.    Breast US 09/09/2017 Right Breast Targeted right breast ultrasound is performed, showing a very large hypoechoic mass with irregular margins extending from the approximate 9 o'clock position through the 12 to 1 o'clock position. On the CC gait image, the mass measures maximally approximately 7.5 cm. The mass has irregular margins, demonstrates acoustic shadowing, and demonstrates internal power Doppler flow. The mass extends into the retroareolar region and involves the nipple, accounting for the nipple retraction.  Separate from the dominant mass at the 7 o'clock position approximately 4 cm from nipple is a hypoechoic antiparallel mass with irregular margins measuring approximately 1.4 x 2.7 x 1.7 cm.  Separate from the dominant mass at the 1 o'clock position approximately 2 cm from nipple is a hypoechoic mass with irregular margins measuring approximately 1.7 x 0.5 x 1.6 cm. Both of these masses also demonstrate internal power Doppler flow.  Sonographic evaluation of the right axilla demonstrates 2 adjacent pathologic lymph nodes, the larger measuring approximately 2.5 x 2.5 x 2.8 cm, the smaller measuring approximately 2.0 x 0.8 x 2.7 cm.  Breast US 09/09/2017- Left Breast Targeted left breast ultrasound is performed, showing an oval parallel hypoechoic mass containing microcalcifications at the 2 o'clock position approximately 6 cm from nipple measuring approximately 0.9 x 0.5 x 0.9 cm, corresponding to the mammographic finding.  Sonographic evaluation of the left axilla demonstrates no pathologic lymphadenopathy.   Histology per Pathology Report: Left/Right Breast 09/25/2017   Receptor Status: ER(+ 100%), PR (- 0%), Her2-neu (-), Ki-67(30%)- Right Breast  Receptor Status: ER (+ 100%), PR (+ 80%), Her2-neu (-), Ki-67(5%)- Left Breast   Past/Anticipated interventions by surgeon, if any:   Past/Anticipated  interventions by medical oncology, if any: Chemotherapy  NP [place]/Dr.  Burr Medico 10/08/2017 -We reviewed her medical chart including imaging and pathology in detail with the patient. She has locally advanced right breast cancer with a dominant 9.6 cm mass involving all 4 quadrants of the breast involving 5 pathologic axillary lymph nodes. She has a small left side breast cancer as well. [name] recommends staging work up with CT CAP and bone scan to rule out distant metastasis. She saw Dr. Dalbert Batman who discussed surgical options, including right mastectomy and possible left breast conserving surgery with subsequent radiation. She will meet with Dr. Lisbeth Renshaw this week. [name] discussed the advantages of neoadjuvant chemotherapy and recommends AC x4 followed by weekly taxol x12 cycles. She is active and relatively healthy, she appears to be a good candidate for chemotherapy  -Given her ER positive disease, she is a candidate for anti-estrogen therapy in the future. Will discuss further when the time is relevant.   Lymphedema issues, if any: None   Pain issues, if any: No  BP (!) 158/91   Pulse 82   Temp 98.1 F (36.7 C)   Resp 18   Ht '5\' 4"'$  (1.626 m)   Wt 172 lb 9.6 oz (78.3 kg)   SpO2 98%   BMI 29.63 kg/m   Wt Readings from Last 3 Encounters:  10/08/17 172 lb 9.6 oz (78.3 kg)  09/24/17 178 lb 3.2 oz (80.8 kg)  09/16/17 181 lb 12.8 oz (82.5 kg)    SAFETY ISSUES:  Prior radiation? No  Pacemaker/ICD? No  Possible current pregnancy? No, Tubal ligation  Is the patient on methotrexate? No  Current Complaints / other details:  Smokes Ivin Booty, RN 10/13/2017,3:24 PM

## 2017-10-14 ENCOUNTER — Ambulatory Visit
Admission: RE | Admit: 2017-10-14 | Discharge: 2017-10-14 | Disposition: A | Discharge status: Home / Self Care | Payer: Medicaid Other | Source: Ambulatory Visit | Attending: Radiation Oncology | Admitting: Radiation Oncology

## 2017-10-14 ENCOUNTER — Encounter: Payer: Self-pay | Admitting: Pharmacy Technician

## 2017-10-14 ENCOUNTER — Telehealth: Payer: Self-pay

## 2017-10-14 ENCOUNTER — Other Ambulatory Visit: Payer: Self-pay | Admitting: Radiation Oncology

## 2017-10-14 ENCOUNTER — Other Ambulatory Visit: Payer: Self-pay

## 2017-10-14 ENCOUNTER — Encounter: Payer: Self-pay | Admitting: Hematology

## 2017-10-14 ENCOUNTER — Encounter: Payer: Self-pay | Admitting: Radiation Oncology

## 2017-10-14 ENCOUNTER — Other Ambulatory Visit (HOSPITAL_COMMUNITY): Payer: Self-pay | Admitting: General Surgery

## 2017-10-14 ENCOUNTER — Encounter: Payer: Self-pay | Admitting: *Deleted

## 2017-10-14 VITALS — BP 158/91 | HR 82 | Temp 98.1°F | Resp 18 | Ht 64.0 in | Wt 172.6 lb

## 2017-10-14 DIAGNOSIS — C50812 Malignant neoplasm of overlapping sites of left female breast: Principal | ICD-10-CM

## 2017-10-14 DIAGNOSIS — Z170 Estrogen receptor positive status [ER+]: Secondary | ICD-10-CM | POA: Insufficient documentation

## 2017-10-14 DIAGNOSIS — M129 Arthropathy, unspecified: Secondary | ICD-10-CM | POA: Diagnosis not present

## 2017-10-14 DIAGNOSIS — F17210 Nicotine dependence, cigarettes, uncomplicated: Secondary | ICD-10-CM | POA: Diagnosis not present

## 2017-10-14 DIAGNOSIS — K219 Gastro-esophageal reflux disease without esophagitis: Secondary | ICD-10-CM | POA: Insufficient documentation

## 2017-10-14 DIAGNOSIS — Z79899 Other long term (current) drug therapy: Secondary | ICD-10-CM | POA: Insufficient documentation

## 2017-10-14 DIAGNOSIS — C50919 Malignant neoplasm of unspecified site of unspecified female breast: Secondary | ICD-10-CM

## 2017-10-14 DIAGNOSIS — Z006 Encounter for examination for normal comparison and control in clinical research program: Secondary | ICD-10-CM

## 2017-10-14 DIAGNOSIS — C50811 Malignant neoplasm of overlapping sites of right female breast: Secondary | ICD-10-CM | POA: Diagnosis present

## 2017-10-14 DIAGNOSIS — C773 Secondary and unspecified malignant neoplasm of axilla and upper limb lymph nodes: Secondary | ICD-10-CM | POA: Insufficient documentation

## 2017-10-14 NOTE — Progress Notes (Signed)
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   HISTORY OF PRESENT ILLNESS: Darlene Beasley is a 62 y.o. female seen in the multidisciplinary breast clinic for a new diagnosis of bilateral breast cancer. The patient was noted to have crusting of the nipple and a pinching sensation of the right breast.  She went in for diagnostic imaging on 09/09/2017 revealing a large palpable mass on physical examination measuring 7 to 8 cm in size with retraction of the nipple, and concerns for peau d'orange.  Corresponding to the palpable lesion on tomosynthesis revealed a 3 x 5 cm area of focal asymmetry, and diffuse trabecular thickening of the skin throughout the right breast.  A right axillary lymph node was possibly seen as well.  Targeted breast ultrasound revealed a large hypoechoic mass with irregular margins extending from 9-1 o'clock.  It measured approximately 7.5 cm, and separate from the mass at 7:00 with a 1.4 x 2.7 x 1.7 cm mass with irregular margins, at 1:00 there was another mass measuring 1.7 x 0.5 x 1.6 cm, and in the right axilla at least 2 pathologically enlarged lymph nodes measuring 2.5 x 2.5 x 2.8 cm, and 2 x 2.8 x 2.7 cm.  Left breast ultrasound revealed a oval hypoechoic mass with microcalcifications at 2:00 measuring 9 x 5 x 9 mm.  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  The left breast revealed an ER PR positive, HER-2 negative cancer with a Ki-67 of 5%.  Her right-sided biopsy was ER positive PR negative,HER-2 negative, and her lymph node was also  sampled and positive for metastatic carcinoma.  Bilateral breast MRI on 10/07/2017 revealed biopsy changes and clip artifact within the left breast with the biopsy site corresponding to a 9 x 8 x 7 mm lesion in the upper outer quadrant.  In the right breast again there was both skin and trabecular edema with enlargement of the right breast and nipple retraction, the enhancing mass measured 6.8 x 9.6 x 7.2 cm.  It predominantly involves the anterior third and central upper right breast, and 5 morphologically abnormal lymph nodes were seen in the right axilla the largest measuring up to 3.4 cm.  She underwent repeat assessment yesterday for additional biopsy sampling of the right breast, both sites, at 1:00, and 7:00 both revealed morphologically similar grade 2 invasive ductal carcinoma.  She is planning to begin neoadjuvant chemotherapy under the care of [name] and will begin this next week.  She comes today to discuss the role of radiotherapy as it pertains to her cancer care.   PREVIOUS RADIATION THERAPY: No   PAST MEDICAL HISTORY:  Past Medical History:  Diagnosis Date  . Arthritis   . GERD (gastroesophageal reflux disease)        PAST SURGICAL HISTORY: Past Surgical History:  Procedure Laterality Date  . TONSILLECTOMY Bilateral   . TUBAL LIGATION       FAMILY HISTORY:  Family History  Adopted: Yes  Problem Relation Age of Onset  . Congestive Heart Failure Mother   .  Congestive Heart Failure Father      SOCIAL HISTORY:  reports that she has been smoking cigarettes.  She has a 25.50 pack-year smoking history. She has never used smokeless tobacco. She reports that she has current or past drug history. Drug: Marijuana. She reports that she does not drink alcohol.  The patient is single but in a long-term relationship.  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   ALLERGIES: Bee venom   MEDICATIONS:  Current Outpatient Medications  Medication Sig Dispense  Refill  . lidocaine-prilocaine (EMLA) cream Apply to affected area once 30 g 3  . ondansetron (ZOFRAN) 8 MG tablet Take 1 tablet (8 mg total) by mouth 2 (two) times daily as needed. Start on the third day after chemotherapy. 30 tablet 1  . prochlorperazine (COMPAZINE) 10 MG tablet Take 1 tablet (10 mg total) by mouth every 6 (six) hours as needed (Nausea or vomiting). 30 tablet 1   No current facility-administered medications for this encounter.      REVIEW OF SYSTEMS: On review of systems, the patient reports that she is doing well overall. She denies any chest pain, shortness of breath, cough, fevers, chills, night sweats, unintended weight changes. She denies any bowel or bladder disturbances, and denies abdominal pain, nausea or vomiting. She denies any new musculoskeletal or joint aches or pains. A complete review of systems is obtained and is otherwise negative.     PHYSICAL EXAM:  Wt Readings from Last 3 Encounters:  10/14/17 172 lb 9.6 oz (78.3 kg)  10/08/17 172 lb 9.6 oz (78.3 kg)  09/24/17 178 lb 3.2 oz (80.8 kg)   Temp Readings from Last 3 Encounters:  10/14/17 98.1 F (36.7 C)  10/08/17 98.4 F (36.9 C) (Oral)  09/24/17 98.6 F (37 C) (Oral)   BP Readings from Last 3 Encounters:  10/14/17 (!) 158/91  10/08/17 (!) 171/83  09/24/17 128/90   Pulse Readings from Last 3 Encounters:  10/14/17 82  10/08/17 73  09/24/17 87     In general this is a well appearing caucasian female in no acute distress. She is alert and oriented x4 and appropriate throughout the examination. HEENT reveals that the patient is normocephalic, atraumatic. EOMs are intact. Cardiopulmonary assessment is negative for acute distress and she exhibits normal effort. Bilateral breast exam is deferred.   ECOG = 1  0 - Asymptomatic (Fully active, able to carry on all predisease activities without restriction)  1 - Symptomatic but completely ambulatory (Restricted in physically strenuous activity but  ambulatory and able to carry out work of a light or sedentary nature. For example, light housework, office work)  2 - Symptomatic, <50% in bed during the day (Ambulatory and capable of all self care but unable to carry out any work activities. Up and about more than 50% of waking hours)  3 - Symptomatic, >50% in bed, but not bedbound (Capable of only limited self-care, confined to bed or chair 50% or more of waking hours)  4 - Bedbound (Completely disabled. Cannot carry on any self-care. Totally confined to bed or chair)  5 - Death   Eustace Pen MM, Creech RH, Tormey DC, et al. [phone]). "Toxicity and response criteria of the [facility]". [place] Oncol. 5 (6): 649-55    LABORATORY DATA:  Lab Results  Component Value Date   WBC 8.0 09/10/2017   HGB 14.6 09/10/2017   HCT 42.3 09/10/2017   MCV 88 09/10/2017   PLT  225 09/10/2017   Lab Results  Component Value Date   NA 143 09/10/2017   K 4.9 09/10/2017   CL 106 09/10/2017   CO2 18 (L) 09/10/2017   Lab Results  Component Value Date   ALT 18 09/10/2017   AST 19 09/10/2017   ALKPHOS 68 09/10/2017   BILITOT 0.4 09/10/2017      RADIOGRAPHY: Dg Chest 2 View  Result Date: 09/24/2017 CLINICAL DATA:  Cough EXAM: CHEST - 2 VIEW COMPARISON:  09/08/2013 FINDINGS: The heart size and mediastinal contours are within normal limits. Both lungs are clear. The visualized skeletal structures are unremarkable. IMPRESSION: No active cardiopulmonary disease. Electronically Signed   By: Franchot Gallo M.D.   On: 09/24/2017 18:14   Mr Breast Bilateral W Wo Contrast Inc Cad  Result Date: 10/08/2017 CLINICAL DATA:  63 year old female with recently diagnosed bilateral breast invasive mammary carcinoma. Ultrasound-guided biopsy of a 7.5 cm mass in the right breast at the 11:30 position revealed grade 2 invasive mammary carcinoma and ultrasound-guided biopsy an abnormal lymph node in the right axilla revealed metastatic mammary  carcinoma. Ultrasound-guided biopsy of a mass in the left breast at the 2 o'clock position revealed grade 1 invasive ductal carcinoma. LABS:  Not applicable. EXAM: BILATERAL BREAST MRI WITH AND WITHOUT CONTRAST TECHNIQUE: Multiplanar, multisequence MR images of both breasts were obtained prior to and following the intravenous administration of 16 ml of MultiHance. THREE-DIMENSIONAL MR IMAGE RENDERING ON INDEPENDENT WORKSTATION: Three-dimensional MR images were rendered by post-processing of the original MR data on an independent workstation. The three-dimensional MR images were interpreted, and findings are reported in the following complete MRI report for this study. Three dimensional images were evaluated at the independent DynaCad workstation COMPARISON:  Previous exam(s). FINDINGS: Breast composition: b.  Scattered fibroglandular tissue. Background parenchymal enhancement: Mild. Right breast: There is both skin and trabecular edema with enlargement of the right breast as well as nipple retraction. A biopsy marking clip is present in the upper-outer right breast at site of known biopsy proven malignancy, with the large irregular enhancing mass measuring 6.8 cm AP, 9.6 cm transverse, and 7.2 cm craniocaudal. This mass predominantly involves the anterior third of the central upper right breast (both upper outer and upper inner quadrants), however extends inferiorly to mainly involve the lower outer quadrant (subtracted image 88) and to a lesser extent the lower inner quadrant (subtraction image 105). Left breast: Biopsy changes with clip artifact and small amount of enhancement present at site of biopsy proven malignancy in the upper-outer left breast, with this area measuring approximately 0.9 cm AP, 0.8 cm transverse and 0.7 cm craniocaudal. No additional abnormal areas of enhancement identified in the left breast. Lymph nodes: 5 morphologically abnormal and enlarged level 1 lymph nodes are present in the right  axilla, the largest of which measures up to 3.4 cm (subtraction image 185). Biopsy marking clip is felt to be located in a lymph node located slightly more anterior inferior to the largest lymph node (image 22). No lymphadenopathy seen in the left axilla. No internal mammary lymphadenopathy. Ancillary findings:  None. IMPRESSION: 1. Biopsy proven malignancy involving all 4 quadrants of the right breast, although predominantly located in the anterior third of the central upper right breast measures 6.8 x 9.6 x 7.2 cm. 2. Five pathologic right axillary lymph nodes, compatible with biopsy proven axillary metastases. 3. Small mass with associated biopsy related changes in the upper-outer left breast at site of known malignancy measures 0.9 x 0.8 x 0.7  cm. No abnormal lymph nodes seen in the left axilla. RECOMMENDATION: Treatment plan for known bilateral breast malignancy. BI-RADS CATEGORY  6: Known biopsy-proven malignancy. Electronically Signed   By: Everlean Alstrom M.D.   On: 10/08/2017 10:02   [place] Axillary Node Core Biopsy Right  Addendum Date: 09/25/2017   ADDENDUM REPORT: 09/25/2017 08:08 ADDENDUM: Pathology revealed GRADE I INVASIVE DUCTAL CARCINOMA of the Left breast, 2 o'clock. This was found to be concordant by Dr. Franki Cabot. 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 This was found to be concordant by Dr. Franki Cabot. Pathology results were discussed with the patient by telephone. The patient reported doing well after the biopsies with tenderness at the sites. Post biopsy instructions and care were reviewed and questions were answered. The patient was encouraged to call [facility] for any additional concerns. Surgical consultation has been arranged with Dr. [name] at [facility] on September 29, 2017. Pathology results reported by Terie Purser, RN on 09/25/2017.  Electronically Signed   By: Franki Cabot M.D.   On: 09/25/2017 08:08   Result Date: 09/25/2017 CLINICAL DATA:  Patient with highly suspicious large RIGHT breast mass involving the outer and upper RIGHT breast, 9:00 to 12:00 to 1 o'clock axes, measuring at least 7 cm greatest dimension with associated involvement of the RIGHT nipple, presenting today for ultrasound-guided core biopsy. Patient also with enlarged lymph nodes in the RIGHT axilla presenting today for ultrasound-guided core biopsy. Lastly, patient also with an indeterminate 9 mm solid mass within the upper-outer quadrant of the LEFT breast for which patient also presents today for ultrasound-guided core biopsy. EXAM: ULTRASOUND GUIDED RIGHT BREAST CORE NEEDLE BIOPSY COMPARISON:  Previous exam(s). PROCEDURE: I met with the patient and we discussed the procedure of ultrasound-guided biopsy, including benefits and alternatives. We discussed the high likelihood of a successful procedure. We discussed the risks of the procedure including infection, bleeding, tissue injury, clip migration, and inadequate sampling. Informed written consent was given. The usual time-out protocol was performed immediately prior to the procedure. Site 1: Using sterile technique and 1% Lidocaine as local anesthetic, under direct ultrasound visualization, a 12 gauge spring-loaded device was used to perform biopsy of the LEFT breast mass at the 2 o'clock axisusing a lateral approach. At the conclusion of the procedure, a ribbon shaped tissue marker clip was deployed into the biopsy cavity. Lesion quadrant: Upper outer quadrant Site 2: Next, using sterile technique and 1% lidocaine as local anesthetic, under direct ultrasound visualization, a 12 gauge spring-loaded device was used to perform biopsy of the RIGHT breast mass at the 11:30 o'clock axis using a lateral approach. At the conclusion of the procedure, a heart shaped tissue marker clip was deployed in the biopsy cavity.  Lesion quadrant: Upper outer quadrant Site 3: Next, using sterile technique and 1% lidocaine as local anesthetic, under direct ultrasound visualization, a 14 gauge spring-loaded device was used to perform biopsy of an enlarged RIGHT axillary lymph node using a lateral approach. At the conclusion of the procedure, a spiral shaped marker clip was deployed in the biopsy cavity. IMPRESSION: 1. Ultrasound-guided biopsy of the LEFT breast mass at the 2 o'clock axis. Ribbon shaped clip placed at the biopsy site. 2. Ultrasound-guided biopsy of the RIGHT breast mass at the 11:30 o'clock axis. Heart shaped clip placed at the biopsy site. 3. Ultrasound-guided biopsy of an enlarged lymph node in the RIGHT  axilla. Spiral shaped clip placed at the biopsy site. No apparent complications. Electronically Signed: By: Franki Cabot M.D. On: 09/23/2017 15:44   Mm Clip Placement Left  Result Date: 09/23/2017 CLINICAL DATA:  Status post ultrasound-guided biopsy of a LEFT breast mass at the 2 o'clock axis. EXAM: DIAGNOSTIC LEFT MAMMOGRAM POST ULTRASOUND BIOPSY COMPARISON:  Previous exam(s). FINDINGS: Mammographic images were obtained following ultrasound guided biopsy of the LEFT breast mass at the 2 o'clock axis. Ribbon shaped clip is appropriately positioned within the mass in the upper-outer quadrant. IMPRESSION: Ribbon shaped biopsy clip is appropriately positioned within the biopsied mass in the upper-outer quadrant of the LEFT breast, 2 o'clock axis. Final Assessment: Post Procedure Mammograms for Marker Placement Electronically Signed   By: Franki Cabot M.D.   On: 09/23/2017 16:03   Mm Clip Placement Right  Result Date: 10/13/2017 CLINICAL DATA:  Patient has biopsy proven right breast cancer metastatic axillary adenopathy. Ultrasound-guided core biopsies of additional lesions requested. EXAM: DIAGNOSTIC RIGHT MAMMOGRAM POST ULTRASOUND BIOPSIES COMPARISON:  Previous exam(s). FINDINGS: Mammographic images were obtained  following ultrasound guided biopsy of the 1 o'clock and 7 o'clock region of the right breast. Mammographic images show there is a coil shaped clip in the upper-inner quadrant of the right breast and a heart shaped clip in the lower outer quadrant of the right breast. There is a ribbon shaped clip in the upper-outer quadrant of the right breast from a prior biopsy. The heart shaped clip is located 3.7 cm inferior to the ribbon shaped clip and the coil shaped clip is located 2.6 cm medial to the ribbon shaped clip. IMPRESSION: Status post ultrasound-guided core biopsies of the right breast with pathology pending. Final Assessment: Post Procedure Mammograms for Marker Placement Electronically Signed   By: Lillia Mountain M.D.   On: 10/13/2017 16:56   Mm Clip Placement Right  Result Date: 09/23/2017 CLINICAL DATA:  Status post biopsy of a large highly suspicious mass in the upper-outer quadrant the RIGHT breast and an enlarged lymph node in the RIGHT axilla. EXAM: DIAGNOSTIC RIGHT MAMMOGRAM POST ULTRASOUND BIOPSY COMPARISON:  Previous exam(s). FINDINGS: Mammographic images were obtained following ultrasound guided biopsy of the large highly suspicious mass in the upper-outer quadrant of the RIGHT breast. At the conclusion the procedure, a heart shaped clip was placed at the biopsy site. This clip is appropriately positioned in the mass. A coil shaped clip is well-positioned within the biopsied lymph node in the RIGHT axilla, as seen on postprocedure ultrasound. IMPRESSION: 1. Heart shaped clip appropriately positioned within the targeted mass in the upper-outer quadrant of the RIGHT breast. 2. Coil shaped clip is appropriately positioned within the targeted abnormal lymph node in the RIGHT axilla, as seen on postprocedure ultrasound. Final Assessment: Post Procedure Mammograms for Marker Placement Electronically Signed   By: Franki Cabot M.D.   On: 09/23/2017 16:03   [place] Lt Breast Bx W Loc Dev 1st Lesion Img Bx Spec  US Guide  Addendum Date: 09/25/2017   ADDENDUM REPORT: 09/25/2017 08:08 ADDENDUM: Pathology revealed GRADE I INVASIVE DUCTAL CARCINOMA of the Left breast, 2 o'clock. This was found to be concordant by Dr. Franki Cabot. 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 This was found to be concordant by Dr. Franki Cabot. Pathology results were discussed with the patient by telephone. The patient reported doing well after the biopsies with tenderness at the sites. Post biopsy instructions and care were  reviewed and questions were answered. The patient was encouraged to call [facility] for any additional concerns. Surgical consultation has been arranged with Dr. [name] at [facility] on September 29, 2017. Pathology results reported by Terie Purser, RN on 09/25/2017. Electronically Signed   By: Franki Cabot M.D.   On: 09/25/2017 08:08   Result Date: 09/25/2017 CLINICAL DATA:  Patient with highly suspicious large RIGHT breast mass involving the outer and upper RIGHT breast, 9:00 to 12:00 to 1 o'clock axes, measuring at least 7 cm greatest dimension with associated involvement of the RIGHT nipple, presenting today for ultrasound-guided core biopsy. Patient also with enlarged lymph nodes in the RIGHT axilla presenting today for ultrasound-guided core biopsy. Lastly, patient also with an indeterminate 9 mm solid mass within the upper-outer quadrant of the LEFT breast for which patient also presents today for ultrasound-guided core biopsy. EXAM: ULTRASOUND GUIDED RIGHT BREAST CORE NEEDLE BIOPSY COMPARISON:  Previous exam(s). PROCEDURE: I met with the patient and we discussed the procedure of ultrasound-guided biopsy, including benefits and alternatives. We discussed the high likelihood of a successful procedure. We discussed the risks of the procedure including infection, bleeding, tissue  injury, clip migration, and inadequate sampling. Informed written consent was given. The usual time-out protocol was performed immediately prior to the procedure. Site 1: Using sterile technique and 1% Lidocaine as local anesthetic, under direct ultrasound visualization, a 12 gauge spring-loaded device was used to perform biopsy of the LEFT breast mass at the 2 o'clock axisusing a lateral approach. At the conclusion of the procedure, a ribbon shaped tissue marker clip was deployed into the biopsy cavity. Lesion quadrant: Upper outer quadrant Site 2: Next, using sterile technique and 1% lidocaine as local anesthetic, under direct ultrasound visualization, a 12 gauge spring-loaded device was used to perform biopsy of the RIGHT breast mass at the 11:30 o'clock axis using a lateral approach. At the conclusion of the procedure, a heart shaped tissue marker clip was deployed in the biopsy cavity. Lesion quadrant: Upper outer quadrant Site 3: Next, using sterile technique and 1% lidocaine as local anesthetic, under direct ultrasound visualization, a 14 gauge spring-loaded device was used to perform biopsy of an enlarged RIGHT axillary lymph node using a lateral approach. At the conclusion of the procedure, a spiral shaped marker clip was deployed in the biopsy cavity. IMPRESSION: 1. Ultrasound-guided biopsy of the LEFT breast mass at the 2 o'clock axis. Ribbon shaped clip placed at the biopsy site. 2. Ultrasound-guided biopsy of the RIGHT breast mass at the 11:30 o'clock axis. Heart shaped clip placed at the biopsy site. 3. Ultrasound-guided biopsy of an enlarged lymph node in the RIGHT axilla. Spiral shaped clip placed at the biopsy site. No apparent complications. Electronically Signed: By: Franki Cabot M.D. On: 09/23/2017 15:44   [place] Rt Breast Bx W Loc Dev 1st Lesion Img Bx Spec US Guide  Result Date: 10/13/2017 CLINICAL DATA:  Biopsy proven bilateral invasive mammary carcinoma. Status post ultrasound-guided core  biopsy of a mass in the 11:30 region of the right breast which revealed grade 2 invasive mammary carcinoma and ultrasound-guided core biopsy of a right axillary lymph node showing metastatic mammary carcinoma. Ultrasound-guided core biopsies of the 1 o'clock and 7 o'clock region of the right breast requested. EXAM: ULTRASOUND GUIDED RIGHT BREAST CORE NEEDLE BIOPSY COMPARISON:  Previous exam(s). FINDINGS: I met with the patient and we discussed the procedure of ultrasound-guided biopsy, including benefits and alternatives. We discussed the high likelihood  of a successful procedure. We discussed the risks of the procedure, including infection, bleeding, tissue injury, clip migration, and inadequate sampling. Informed written consent was given. The usual time-out protocol was performed immediately prior to the procedure. Lesion quadrant: Upper inner quadrant Using sterile technique and 1% Lidocaine as local anesthetic, under direct ultrasound visualization, a 12 gauge spring-loaded device was used to perform biopsy of a mass in the 1 o'clock region of the right breast using a medial to lateral approach. At the conclusion of the procedure a coil shaped tissue marker clip was deployed into the biopsy cavity. Follow up 2 view mammogram was performed and dictated separately. I met with the patient and we discussed the procedure of ultrasound-guided biopsy, including benefits and alternatives. We discussed the high likelihood of a successful procedure. We discussed the risks of the procedure, including infection, bleeding, tissue injury, clip migration, and inadequate sampling. Informed written consent was given. The usual time-out protocol was performed immediately prior to the procedure. Lesion quadrant: Lower outer quadrant Using sterile technique and 1% Lidocaine as local anesthetic, under direct ultrasound visualization, a 12 gauge spring-loaded device was used to perform biopsy of the 7 o'clock region of the right  breast using a lateral to medial approach. At the conclusion of the procedure a heart shaped tissue marker clip was deployed into the biopsy cavity. Follow up 2 view mammogram was performed and dictated separately. IMPRESSION: Ultrasound guided biopsies of the right breast. No apparent complications. Electronically Signed   By: Lillia Mountain M.D.   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 This was found to be concordant by Dr. Franki Cabot. 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 This was found to be concordant by Dr. Franki Cabot. Pathology results were discussed with the patient by telephone. The patient reported doing well after the biopsies with tenderness at the sites. Post biopsy instructions and care were reviewed and questions were answered. The patient was encouraged to call [facility] for any additional concerns. Surgical consultation has been arranged with Dr. [name] at [facility] on September 29, 2017. Pathology results reported by Terie Purser, RN on 09/25/2017. Electronically Signed   By: Franki Cabot M.D.   On: 09/25/2017 08:08   Result Date: 09/25/2017 CLINICAL DATA:  Patient with highly suspicious large RIGHT breast mass involving the outer and upper RIGHT breast, 9:00 to 12:00 to 1 o'clock axes, measuring at least 7 cm greatest dimension with associated involvement of the RIGHT nipple, presenting today for ultrasound-guided core biopsy. Patient also with enlarged lymph nodes in the RIGHT axilla presenting today for ultrasound-guided core biopsy. Lastly, patient also with an indeterminate 9 mm solid mass within the upper-outer quadrant of the LEFT breast for  which patient also presents today for ultrasound-guided core biopsy. EXAM: ULTRASOUND GUIDED RIGHT BREAST CORE NEEDLE BIOPSY COMPARISON:  Previous exam(s). PROCEDURE: I met with the patient and we discussed the procedure of ultrasound-guided biopsy, including benefits and alternatives. We discussed the high likelihood of a successful procedure. We discussed the risks of the procedure including infection, bleeding, tissue injury, clip migration, and inadequate sampling. Informed written consent was given. The usual time-out protocol was performed immediately prior  to the procedure. Site 1: Using sterile technique and 1% Lidocaine as local anesthetic, under direct ultrasound visualization, a 12 gauge spring-loaded device was used to perform biopsy of the LEFT breast mass at the 2 o'clock axisusing a lateral approach. At the conclusion of the procedure, a ribbon shaped tissue marker clip was deployed into the biopsy cavity. Lesion quadrant: Upper outer quadrant Site 2: Next, using sterile technique and 1% lidocaine as local anesthetic, under direct ultrasound visualization, a 12 gauge spring-loaded device was used to perform biopsy of the RIGHT breast mass at the 11:30 o'clock axis using a lateral approach. At the conclusion of the procedure, a heart shaped tissue marker clip was deployed in the biopsy cavity. Lesion quadrant: Upper outer quadrant Site 3: Next, using sterile technique and 1% lidocaine as local anesthetic, under direct ultrasound visualization, a 14 gauge spring-loaded device was used to perform biopsy of an enlarged RIGHT axillary lymph node using a lateral approach. At the conclusion of the procedure, a spiral shaped marker clip was deployed in the biopsy cavity. IMPRESSION: 1. Ultrasound-guided biopsy of the LEFT breast mass at the 2 o'clock axis. Ribbon shaped clip placed at the biopsy site. 2. Ultrasound-guided biopsy of the RIGHT breast mass at the 11:30 o'clock axis. Heart shaped clip placed at  the biopsy site. 3. Ultrasound-guided biopsy of an enlarged lymph node in the RIGHT axilla. Spiral shaped clip placed at the biopsy site. No apparent complications. Electronically Signed: By: Franki Cabot M.D. On: 09/23/2017 15:44   [place] Rt Breast Bx W Loc Dev Ea Add Lesion Img Bx Spec US Guide  Result Date: 10/13/2017 CLINICAL DATA:  Biopsy proven bilateral invasive mammary carcinoma. Status post ultrasound-guided core biopsy of a mass in the 11:30 region of the right breast which revealed grade 2 invasive mammary carcinoma and ultrasound-guided core biopsy of a right axillary lymph node showing metastatic mammary carcinoma. Ultrasound-guided core biopsies of the 1 o'clock and 7 o'clock region of the right breast requested. EXAM: ULTRASOUND GUIDED RIGHT BREAST CORE NEEDLE BIOPSY COMPARISON:  Previous exam(s). FINDINGS: I met with the patient and we discussed the procedure of ultrasound-guided biopsy, including benefits and alternatives. We discussed the high likelihood of a successful procedure. We discussed the risks of the procedure, including infection, bleeding, tissue injury, clip migration, and inadequate sampling. Informed written consent was given. The usual time-out protocol was performed immediately prior to the procedure. Lesion quadrant: Upper inner quadrant Using sterile technique and 1% Lidocaine as local anesthetic, under direct ultrasound visualization, a 12 gauge spring-loaded device was used to perform biopsy of a mass in the 1 o'clock region of the right breast using a medial to lateral approach. At the conclusion of the procedure a coil shaped tissue marker clip was deployed into the biopsy cavity. Follow up 2 view mammogram was performed and dictated separately. I met with the patient and we discussed the procedure of ultrasound-guided biopsy, including benefits and alternatives. We discussed the high likelihood of a successful procedure. We discussed the risks of the procedure, including  infection, bleeding, tissue injury, clip migration, and inadequate sampling. Informed written consent was given. The usual time-out protocol was performed immediately prior to the procedure. Lesion quadrant: Lower outer quadrant Using sterile technique and 1% Lidocaine as local anesthetic, under direct ultrasound visualization, a 12 gauge spring-loaded device was used to perform biopsy of the 7 o'clock region of the right breast using a lateral to medial approach. At the conclusion of the procedure a heart shaped tissue  marker clip was deployed into the biopsy cavity. Follow up 2 view mammogram was performed and dictated separately. IMPRESSION: Ultrasound guided biopsies of the right breast. No apparent complications. Electronically Signed   By: Lillia Mountain M.D.   On: 10/13/2017 16:52       IMPRESSION/PLAN: 1. 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 Dr. Lisbeth Renshaw discusses the pathology findings and reviews the nature of bilateral breast disease. Tthe patient is going to proceed with chemotherapy in the neoadjuvant setting. Based on her recent biopsies it appears that she will need mastectomy to address her disease surgically on the right side. She is hoping to proceed with conservation on the left. Regarding a role for radiotherapy, she would be benefited by this for local control, and would receive a course of treatment to the right chest wall and regional lymph nodes, as well as to the left breast.  We discussed the risks, benefits, short, and long term effects of radiotherapy, and the patient is interested in proceeding. Dr. Lisbeth Renshaw discusses the delivery and logistics of radiotherapy and anticipates a course of 6 1/2 weeks of radiotherapy. We will see her back about 2 weeks after surgery to discuss the simulation process and anticipate we starting  radiotherapy about 4-6 weeks after surgery.  2. Possible genetic predisposition to malignancy. The patient is a candidate for genetic testing given her personal. She does not know her biological family history. Given the bilateral nature of her disease, she was offered referral to discuss possible testing.  In a visit lasting 60 minutes, greater than 50% of the time was spent face to face discussing her case, and coordinating the patient's care.   The above documentation reflects my direct findings during this shared patient visit. Please see the separate note by Dr. Lisbeth Renshaw on this date for the remainder of the patient's plan of care.    Carola Rhine, PAC

## 2017-10-14 NOTE — Telephone Encounter (Signed)
Female answered the phone. I advised to please have Darlene Beasley call back in regards to an appt. Advised that I spoke with her earlier today. I need to advise pt that bones scans were rescheduled for the same day as CT CAP. Pt needs arrival time.

## 2017-10-14 NOTE — Progress Notes (Signed)
Patient brought in proof of income for [employer] as we discussed.  Patient approved for one-time $1000 grant. Patient has a copy of the approval letter as well as the expense sheet along with the outpatient pharmacy information. She has my card for any additional financial questions or concerns.

## 2017-10-14 NOTE — Telephone Encounter (Addendum)
Rescheduled CT CAP for Friday 5/10 @ 1:45pm [facility]. Pt aware and verbalized understanding of instructions prior. Central scheduling is trying to fit in the bone scan as well. Pt echo is scheduled for 5/8  ----- Message from Alla Feeling, NP sent at 10/14/2017  9:56 AM EDT ----- T,   Can you see if you can move her CT CAP and bone scan up before she starts chemo on 5/15? As of now it's scheduled on 5/20. Also her echo is not scheduled. Please get that added on if possible.   Thanks! Regan Rakers

## 2017-10-14 NOTE — Patient Instructions (Signed)
Spoke with patient to let her know I was able to get bone scan moved to 5/10. She is aware of time and date.

## 2017-10-14 NOTE — Telephone Encounter (Signed)
Rescheduled CT CAP for 5/10 @ 1:45 pt aware and verbalized understanding of instructions prior. Pt aware of echo scheduled for tomorrow 5/8. Central scheduling is trying to reschedule  the bone scan for the same day as CT CAP.

## 2017-10-14 NOTE — Progress Notes (Unsigned)
The patient is approved for drug assistance by [employer] for [employer] enrollment 10/15/17 - 10/16/18. The first DOS is 10/22/17. The patient is also enrolled by [employer] for 4 syringes of Udenyca begining DOS 10/24/17.

## 2017-10-15 ENCOUNTER — Ambulatory Visit (HOSPITAL_COMMUNITY)
Admission: RE | Admit: 2017-10-15 | Discharge: 2017-10-15 | Disposition: A | Discharge status: Home / Self Care | Payer: Medicaid Other | Source: Ambulatory Visit | Attending: Nurse Practitioner | Admitting: Nurse Practitioner

## 2017-10-15 ENCOUNTER — Other Ambulatory Visit: Payer: Self-pay

## 2017-10-15 DIAGNOSIS — Z720 Tobacco use: Secondary | ICD-10-CM | POA: Insufficient documentation

## 2017-10-15 DIAGNOSIS — I059 Rheumatic mitral valve disease, unspecified: Secondary | ICD-10-CM | POA: Diagnosis not present

## 2017-10-15 DIAGNOSIS — Z170 Estrogen receptor positive status [ER+]: Secondary | ICD-10-CM | POA: Diagnosis present

## 2017-10-15 DIAGNOSIS — C50811 Malignant neoplasm of overlapping sites of right female breast: Secondary | ICD-10-CM | POA: Insufficient documentation

## 2017-10-15 NOTE — Progress Notes (Signed)
  Echocardiogram 2D Echocardiogram has been performed.  Merrie Roof F 10/15/2017, 11:01 AM

## 2017-10-16 ENCOUNTER — Telehealth: Payer: Self-pay

## 2017-10-16 ENCOUNTER — Other Ambulatory Visit: Payer: Self-pay | Admitting: Radiology

## 2017-10-16 NOTE — Telephone Encounter (Signed)
Spoke pt husband in regards to appts scheduled for tomorrow. Per husband pt is aware of scan appts for tomorrow. He will make sure the pt is aware.

## 2017-10-17 ENCOUNTER — Inpatient Hospital Stay: Payer: Medicaid Other

## 2017-10-17 ENCOUNTER — Ambulatory Visit (HOSPITAL_COMMUNITY)
Admission: RE | Admit: 2017-10-17 | Discharge: 2017-10-17 | Disposition: A | Discharge status: Home / Self Care | Payer: Medicaid Other | Source: Ambulatory Visit | Attending: Nurse Practitioner | Admitting: Nurse Practitioner

## 2017-10-17 ENCOUNTER — Encounter (HOSPITAL_COMMUNITY)
Admission: RE | Admit: 2017-10-17 | Discharge: 2017-10-17 | Disposition: A | Discharge status: Home / Self Care | Payer: Medicaid Other | Source: Ambulatory Visit | Attending: Nurse Practitioner | Admitting: Nurse Practitioner

## 2017-10-17 ENCOUNTER — Encounter (HOSPITAL_COMMUNITY): Payer: Self-pay

## 2017-10-17 DIAGNOSIS — N6310 Unspecified lump in the right breast, unspecified quadrant: Secondary | ICD-10-CM | POA: Insufficient documentation

## 2017-10-17 DIAGNOSIS — D493 Neoplasm of unspecified behavior of breast: Secondary | ICD-10-CM | POA: Diagnosis present

## 2017-10-17 DIAGNOSIS — R590 Localized enlarged lymph nodes: Secondary | ICD-10-CM | POA: Diagnosis not present

## 2017-10-17 DIAGNOSIS — Z5111 Encounter for antineoplastic chemotherapy: Secondary | ICD-10-CM | POA: Diagnosis not present

## 2017-10-17 DIAGNOSIS — I700 Atherosclerosis of aorta: Secondary | ICD-10-CM | POA: Insufficient documentation

## 2017-10-17 DIAGNOSIS — Z170 Estrogen receptor positive status [ER+]: Principal | ICD-10-CM

## 2017-10-17 DIAGNOSIS — I2510 Atherosclerotic heart disease of native coronary artery without angina pectoris: Secondary | ICD-10-CM | POA: Diagnosis not present

## 2017-10-17 DIAGNOSIS — C50811 Malignant neoplasm of overlapping sites of right female breast: Secondary | ICD-10-CM

## 2017-10-17 HISTORY — DX: Malignant (primary) neoplasm, unspecified: C80.1

## 2017-10-17 LAB — CBC WITH DIFFERENTIAL (CANCER CENTER ONLY)
BASOS ABS: 0 10*3/uL (ref 0.0–0.1)
BASOS PCT: 0 %
EOS ABS: 0.1 10*3/uL (ref 0.0–0.5)
EOS PCT: 1 %
HCT: 41.7 % (ref 34.8–46.6)
Hemoglobin: 13.9 g/dL (ref 11.6–15.9)
Lymphocytes Relative: 46 %
Lymphs Abs: 3.4 10*3/uL — ABNORMAL HIGH (ref 0.9–3.3)
MCH: 30.3 pg (ref 25.1–34.0)
MCHC: 33.3 g/dL (ref 31.5–36.0)
MCV: 90.8 fL (ref 79.5–101.0)
Monocytes Absolute: 0.3 10*3/uL (ref 0.1–0.9)
Monocytes Relative: 5 %
Neutro Abs: 3.5 10*3/uL (ref 1.5–6.5)
Neutrophils Relative %: 48 %
PLATELETS: 192 10*3/uL (ref 145–400)
RBC: 4.59 MIL/uL (ref 3.70–5.45)
RDW: 14.1 % (ref 11.2–14.5)
WBC Count: 7.3 10*3/uL (ref 3.9–10.3)

## 2017-10-17 LAB — CMP (CANCER CENTER ONLY)
ALK PHOS: 62 U/L (ref 40–150)
ALT: 21 U/L (ref 0–55)
AST: 17 U/L (ref 5–34)
Albumin: 4 g/dL (ref 3.5–5.0)
Anion gap: 7 (ref 3–11)
BUN: 24 mg/dL (ref 7–26)
CALCIUM: 9.6 mg/dL (ref 8.4–10.4)
CO2: 27 mmol/L (ref 22–29)
CREATININE: 0.85 mg/dL (ref 0.60–1.10)
Chloride: 108 mmol/L (ref 98–109)
GFR, Est AFR Am: >60 mL/min (ref >60)
GFR, Estimated: >60 mL/min (ref >60)
Glucose, Bld: 95 mg/dL (ref 70–140)
POTASSIUM: 4.6 mmol/L (ref 3.5–5.1)
SODIUM: 142 mmol/L (ref 136–145)
TOTAL PROTEIN: 6.5 g/dL (ref 6.4–8.3)
Total Bilirubin: 0.6 mg/dL (ref 0.2–1.2)

## 2017-10-17 MED ORDER — TECHNETIUM TC 99M MEDRONATE IV KIT
21.0000 | PACK | Freq: Once | INTRAVENOUS | Status: AC | PRN
  Administered 2017-10-17: 21 via INTRAVENOUS

## 2017-10-17 MED ORDER — IOHEXOL 300 MG/ML  SOLN
100.0000 mL | Freq: Once | INTRAMUSCULAR | Status: AC | PRN
  Administered 2017-10-17: 100 mL via INTRAVENOUS

## 2017-10-17 MED ORDER — FLUDEOXYGLUCOSE F - 18 (FDG) INJECTION: 21.0000 | Freq: Once | INTRAVENOUS | Status: DC | PRN

## 2017-10-17 NOTE — Telephone Encounter (Signed)
Opened by error.

## 2017-10-20 ENCOUNTER — Ambulatory Visit (HOSPITAL_COMMUNITY)
Admission: RE | Admit: 2017-10-20 | Discharge: 2017-10-20 | Disposition: A | Discharge status: Home / Self Care | Payer: Medicaid Other | Source: Ambulatory Visit | Attending: General Surgery | Admitting: General Surgery

## 2017-10-20 ENCOUNTER — Encounter (HOSPITAL_COMMUNITY): Payer: Self-pay

## 2017-10-20 ENCOUNTER — Other Ambulatory Visit (HOSPITAL_COMMUNITY): Payer: Self-pay | Admitting: General Surgery

## 2017-10-20 DIAGNOSIS — Z9889 Other specified postprocedural states: Secondary | ICD-10-CM | POA: Insufficient documentation

## 2017-10-20 DIAGNOSIS — Z91030 Bee allergy status: Secondary | ICD-10-CM | POA: Insufficient documentation

## 2017-10-20 DIAGNOSIS — F1210 Cannabis abuse, uncomplicated: Secondary | ICD-10-CM | POA: Diagnosis not present

## 2017-10-20 DIAGNOSIS — C50919 Malignant neoplasm of unspecified site of unspecified female breast: Secondary | ICD-10-CM | POA: Diagnosis present

## 2017-10-20 DIAGNOSIS — Z8249 Family history of ischemic heart disease and other diseases of the circulatory system: Secondary | ICD-10-CM | POA: Diagnosis not present

## 2017-10-20 DIAGNOSIS — M1990 Unspecified osteoarthritis, unspecified site: Secondary | ICD-10-CM | POA: Insufficient documentation

## 2017-10-20 DIAGNOSIS — F17210 Nicotine dependence, cigarettes, uncomplicated: Secondary | ICD-10-CM | POA: Diagnosis not present

## 2017-10-20 DIAGNOSIS — Z9851 Tubal ligation status: Secondary | ICD-10-CM | POA: Insufficient documentation

## 2017-10-20 HISTORY — PX: IR US GUIDE VASC ACCESS LEFT: IMG2389

## 2017-10-20 HISTORY — PX: IR IMAGING GUIDED PORT INSERTION: IMG5740

## 2017-10-20 LAB — CBC WITH DIFFERENTIAL/PLATELET
BASOS ABS: 0 10*3/uL (ref 0.0–0.1)
BASOS PCT: 1 %
Eosinophils Absolute: 0.2 10*3/uL (ref 0.0–0.7)
Eosinophils Relative: 3 %
HEMATOCRIT: 41.5 % (ref 36.0–46.0)
Hemoglobin: 14.4 g/dL (ref 12.0–15.0)
LYMPHS PCT: 35 %
Lymphs Abs: 2.1 10*3/uL (ref 0.7–4.0)
MCH: 31.3 pg (ref 26.0–34.0)
MCHC: 34.7 g/dL (ref 30.0–36.0)
MCV: 90.2 fL (ref 78.0–100.0)
Monocytes Absolute: 0.3 10*3/uL (ref 0.1–1.0)
Monocytes Relative: 5 %
NEUTROS ABS: 3.4 10*3/uL (ref 1.7–7.7)
Neutrophils Relative %: 56 %
Platelets: 216 10*3/uL (ref 150–400)
RBC: 4.6 MIL/uL (ref 3.87–5.11)
RDW: 13.9 % (ref 11.5–15.5)
WBC: 6 10*3/uL (ref 4.0–10.5)

## 2017-10-20 LAB — PROTIME-INR
INR: 0.9
Prothrombin Time: 12 seconds (ref 11.4–15.2)

## 2017-10-20 MED ORDER — SODIUM CHLORIDE 0.9 % IV SOLN: INTRAVENOUS | Status: DC

## 2017-10-20 MED ORDER — LIDOCAINE HCL 1 % IJ SOLN
INTRAMUSCULAR | Status: AC
  Filled 2017-10-20: qty 20

## 2017-10-20 MED ORDER — FENTANYL CITRATE (PF) 100 MCG/2ML IJ SOLN
INTRAMUSCULAR | Status: AC
  Filled 2017-10-20: qty 2

## 2017-10-20 MED ORDER — MIDAZOLAM HCL 2 MG/2ML IJ SOLN
INTRAMUSCULAR | Status: AC | PRN
  Administered 2017-10-20 (×3): 1 mg via INTRAVENOUS

## 2017-10-20 MED ORDER — FENTANYL CITRATE (PF) 100 MCG/2ML IJ SOLN
INTRAMUSCULAR | Status: AC | PRN
  Administered 2017-10-20 (×2): 50 ug via INTRAVENOUS

## 2017-10-20 MED ORDER — CEFAZOLIN SODIUM-DEXTROSE 2-4 GM/100ML-% IV SOLN
INTRAVENOUS | Status: AC
  Filled 2017-10-20: qty 100

## 2017-10-20 MED ORDER — HEPARIN SOD (PORK) LOCK FLUSH 100 UNIT/ML IV SOLN
INTRAVENOUS | Status: AC | PRN
  Administered 2017-10-20: 500 [IU] via INTRAVENOUS

## 2017-10-20 MED ORDER — MIDAZOLAM HCL 2 MG/2ML IJ SOLN
INTRAMUSCULAR | Status: AC
  Filled 2017-10-20: qty 4

## 2017-10-20 MED ORDER — LIDOCAINE HCL (PF) 1 % IJ SOLN
INTRAMUSCULAR | Status: AC | PRN
  Administered 2017-10-20: 5 mL

## 2017-10-20 MED ORDER — HEPARIN SOD (PORK) LOCK FLUSH 100 UNIT/ML IV SOLN
INTRAVENOUS | Status: AC
  Filled 2017-10-20: qty 5

## 2017-10-20 MED ORDER — LIDOCAINE-EPINEPHRINE (PF) 2 %-1:200000 IJ SOLN
INTRAMUSCULAR | Status: AC
  Filled 2017-10-20: qty 20

## 2017-10-20 MED ORDER — CEFAZOLIN SODIUM-DEXTROSE 2-4 GM/100ML-% IV SOLN
2.0000 g | INTRAVENOUS | Status: AC
  Administered 2017-10-20: 2 g via INTRAVENOUS

## 2017-10-20 MED ORDER — LIDOCAINE-EPINEPHRINE (PF) 1 %-1:200000 IJ SOLN
INTRAMUSCULAR | Status: AC | PRN
  Administered 2017-10-20: 30 mL

## 2017-10-20 NOTE — Discharge Instructions (Signed)
Moderate Conscious Sedation, Adult, Care After °These instructions provide you with information about caring for yourself after your procedure. Your health care provider may also give you more specific instructions. Your treatment has been planned according to current medical practices, but problems sometimes occur. Call your health care provider if you have any problems or questions after your procedure. °What can I expect after the procedure? °After your procedure, it is common: °· To feel sleepy for several hours. °· To feel clumsy and have poor balance for several hours. °· To have poor judgment for several hours. °· To vomit if you eat too soon. ° °Follow these instructions at home: °For at least 24 hours after the procedure: ° °· Do not: °? Participate in activities where you could fall or become injured. °? Drive. °? Use heavy machinery. °? Drink alcohol. °? Take sleeping pills or medicines that cause drowsiness. °? Make important decisions or sign legal documents. °? Take care of children on your own. °· Rest. °Eating and drinking °· Follow the diet recommended by your health care provider. °· If you vomit: °? Drink water, juice, or soup when you can drink without vomiting. °? Make sure you have little or no nausea before eating solid foods. °General instructions °· Have a responsible adult stay with you until you are awake and alert. °· Take over-the-counter and prescription medicines only as told by your health care provider. °· If you smoke, do not smoke without supervision. °· Keep all follow-up visits as told by your health care provider. This is important. °Contact a health care provider if: °· You keep feeling nauseous or you keep vomiting. °· You feel light-headed. °· You develop a rash. °· You have a fever. °Get help right away if: °· You have trouble breathing. °This information is not intended to replace advice given to you by your health care provider. Make sure you discuss any questions you have  with your health care provider. °Document Released: 03/17/2013 Document Revised: 10/30/2015 Document Reviewed: 09/16/2015 °Elsevier Interactive Patient Education © 2018 [employer] ° ° °Implanted Port Insertion, Care After °This sheet gives you information about how to care for yourself after your procedure. Your health care provider may also give you more specific instructions. If you have problems or questions, contact your health care provider. °What can I expect after the procedure? °After your procedure, it is common to have: °· Discomfort at the port insertion site. °· Bruising on the skin over the port. This should improve over 3-4 days. ° °Follow these instructions at home: °Port care °· After your port is placed, you will get a manufacturer's information card. The card has information about your port. Keep this card with you at all times. °· Take care of the port as told by your health care provider. Ask your health care provider if you or a family member can get training for taking care of the port at home. A home health care nurse may also take care of the port. °· Make sure to remember what type of port you have. °Incision care °· Follow instructions from your health care provider about how to take care of your port insertion site. Make sure you: °? Wash your hands with soap and water before you change your bandage (dressing). If soap and water are not available, use hand sanitizer. °? Change your dressing as told by your health care provider.  You may remove your dressing tomorrow. °? Leave skin glue in place. These skin closures   may need to stay in place for 2 weeks or longer. If adhesive strip edges start to loosen and curl up, you may trim the loose edges. Do not remove adhesive strips completely unless your health care provider tells you to do that.  DO NOT use EMLA cream for 2 weeks after port placement as this cream will remove surgical glue on your incision. °· Check your port insertion site  every day for signs of infection. Check for: °? More redness, swelling, or pain. °? More fluid or blood. °? Warmth. °? Pus or a bad smell. °General instructions °· Do not take baths, swim, or use a hot tub until your health care provider approves.  You may shower tomorrow. °· Do not lift anything that is heavier than 10 lb (4.5 kg) for a week, or as told by your health care provider. °· Ask your health care provider when it is okay to: °? Return to work or school. °? Resume usual physical activities or sports. °· Do not drive for 24 hours if you were given a medicine to help you relax (sedative). °· Take over-the-counter and prescription medicines only as told by your health care provider. °· Wear a medical alert bracelet in case of an emergency. This will tell any health care providers that you have a port. °· Keep all follow-up visits as told by your health care provider. This is important. °Contact a health care provider if: °· You have a fever or chills. °· You have more redness, swelling, or pain around your port insertion site. °· You have more fluid or blood coming from your port insertion site. °· Your port insertion site feels warm to the touch. °· You have pus or a bad smell coming from the port insertion site. °Get help right away if: °· You have chest pain or shortness of breath. °· You have bleeding from your port that you cannot control. °Summary °· Take care of the port as told by your health care provider. °· Change your dressing as told by your health care provider. °· Keep all follow-up visits as told by your health care provider. °This information is not intended to replace advice given to you by your health care provider. Make sure you discuss any questions you have with your health care provider. °Document Released: 03/17/2013 Document Revised: 04/17/2016 Document Reviewed: 04/17/2016 °Elsevier Interactive Patient Education © 2017 Else[employer] Inc. ° °

## 2017-10-20 NOTE — Procedures (Signed)
Breast ca  S/p LT IJ PORT  Tip svcra No comp Stable EBL MIN Ready for use Full report in pacs

## 2017-10-20 NOTE — Consult Note (Signed)
Chief Complaint: Patient was seen in consultation today for Port-A-Cath placement  Referring Physician(s): Feng,Y  Supervising Physician: Daryll Brod  Patient Status: [facility] - Out-pt  History of Present Illness: Darlene Beasley is a 62 y.o. female smoker  with history of recently diagnosed bilateral breast cancers who presents today for Port-A-Cath placement for chemotherapy.  Past Medical History:  Diagnosis Date  . Arthritis   . breast ca dx'd 08/2017  . GERD (gastroesophageal reflux disease)     Past Surgical History:  Procedure Laterality Date  . TONSILLECTOMY Bilateral   . TUBAL LIGATION      Allergies: Bee venom  Medications: Prior to Admission medications   Medication Sig Start Date End Date Taking? Authorizing Provider  lidocaine-prilocaine (EMLA) cream Apply to affected area once 10/09/17   [name], MD  ondansetron (ZOFRAN) 8 MG tablet Take 1 tablet (8 mg total) by mouth 2 (two) times daily as needed. Start on the third day after chemotherapy. 10/09/17   [name], MD  prochlorperazine (COMPAZINE) 10 MG tablet Take 1 tablet (10 mg total) by mouth every 6 (six) hours as needed (Nausea or vomiting). 10/09/17   [name], MD     Family History  Adopted: Yes  Problem Relation Age of Onset  . Congestive Heart Failure Mother   . Congestive Heart Failure Father     Social History   Socioeconomic History  . Marital status: Significant Other    Spouse name: Not on file  . Number of children: 1  . Years of education: Not on file  . Highest education level: Not on file  Occupational History  . Not on file  Social Needs  . Financial resource strain: Not on file  . Food insecurity:    Worry: Not on file    Inability: Not on file  . Transportation needs:    Medical: Not on file    Non-medical: Not on file  Tobacco Use  . Smoking status: Current Every Day Smoker    Packs/day: 0.50    Years: 51.00    Pack years: 25.50    Types: Cigarettes  . Smokeless  tobacco: Never Used  . Tobacco comment: Working on quitting.  Substance and Sexual Activity  . Alcohol use: No    Comment: stopped 08/2017   . 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 Sexual activity: Not Currently  Lifestyle  . Physical activity:    Days per week: 7 days    Minutes per session: 80 min  . Stress: Very much  Relationships  . Social connections:    Talks on phone: Never    Gets together: Never    Attends religious service: Never    Active member of club or organization: No    Attends meetings of clubs or organizations: Never    Relationship status: Living with partner  Other Topics Concern  . Not on file  Social History Narrative  . Not on file      Review of Systems denies fever, headache, chest pain, dyspnea, cough, abdominal pain, nausea, vomiting or bleeding.  She does have occ back pain and she is anxious.  Vital Signs: Blood pressure 155/96, heart rate 73, temperature 98.2, respirations 18, O2 sat 98% room air   Physical Exam awake, alert.  Chest with distant breath sounds bilaterally.  Heart with regular rate and rhythm.  Abdomen soft, positive bowel sounds, nontender.  No significant lower  extremity edema.  Imaging: Dg Chest 2 View  Result Date: 09/24/2017 CLINICAL DATA:  Cough EXAM: CHEST - 2 VIEW COMPARISON:  09/08/2013 FINDINGS: The heart size and mediastinal contours are within normal limits. Both lungs are clear. The visualized skeletal structures are unremarkable. IMPRESSION: No active cardiopulmonary disease. Electronically Signed   By: Franchot Gallo M.D.   On: 09/24/2017 18:14   Ct Chest W Contrast  Result Date: 10/19/2017 CLINICAL DATA:  Initial staging for right breast cancer. EXAM: CT CHEST, ABDOMEN, AND PELVIS WITH CONTRAST TECHNIQUE: Multidetector CT imaging of the chest, abdomen and pelvis was performed following the standard protocol during bolus administration of intravenous contrast. CONTRAST:  110m  OMNIPAQUE IOHEXOL 300 MG/ML  SOLN COMPARISON:  None FINDINGS: CT CHEST FINDINGS Cardiovascular: Heart size is normal. Aortic atherosclerosis noted. Calcification in the RCA, LAD and left circumflex coronary artery noted. Mediastinum/Nodes: Normal appearance of the thyroid gland. The trachea appears patent and is midline. Normal appearance of the esophagus. No enlarged mediastinal or hilar lymph nodes. Lungs/Pleura: No pleural effusion. Calcified granuloma identified in the right middle lobe. Scarring noted within the lingula. No suspicious pulmonary nodules. Musculoskeletal: No suspicious bone lesions. There is diffuse skin thickening overlying the right breast. Infiltrative tumor throughout the right breast is again noted, image 25/2. Enlarged right axillary lymph nodes are identified. The largest has a short axis of 3.1 cm, image 16/2. No supraclavicular or retropectoral adenopathy identified. CT ABDOMEN PELVIS FINDINGS Hepatobiliary: Left lobe of liver hypodensity measures 11 mm and is favored to represent a simple cyst. No suspicious liver lesions identified. Gallbladder normal. No biliary dilatation. Pancreas: Normal appearance of the pancreas. Spleen: Normal in size without focal abnormality. Adrenals/Urinary Tract: The adrenal glands are normal. No kidney mass or hydronephrosis. Urinary bladder appears normal. Stomach/Bowel: Stomach is unremarkable. No pathologic dilatation of the small bowel loops. Normal appearance of the colon. Vascular/Lymphatic: Aortic atherosclerosis. No aneurysm. No abdominal or pelvic adenopathy. Reproductive: Uterus and adnexal structures are unremarkable. Other: No ascites or focal fluid collections. Musculoskeletal: Benign appearing chondroid lesion within the proximal left femur is identified measuring 2.7 cm, image 82/5. Favor enchondroma or bone infarct. 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 No aggressive bone lesions noted. IMPRESSION: 1. Infiltrative  right breast mass with overlying skin thickening is identified compatible with known breast cancer. 2. Enlarged right axillary lymph nodes compatible with metastatic adenopathy. 3. No additional sites of disease identified within the chest and no evidence for metastasis to the abdomen or pelvis. 4. Aortic atherosclerosis and 3 vessel coronary artery atherosclerotic calcifications. Aortic Atherosclerosis (ICD10-I70.0). Electronically Signed   By: TKerby MoorsM.D.   On: 10/19/2017 22:50   Nm Bone Scan Whole Body  Result Date: 10/17/2017 CLINICAL DATA:  Breast cancer. Evaluate for osseous metastatic disease. EXAM: NUCLEAR MEDICINE WHOLE BODY BONE SCAN TECHNIQUE: Whole body anterior and posterior images were obtained approximately 3 hours after intravenous injection of radiopharmaceutical. RADIOPHARMACEUTICALS:  21.0 mCi Technetium-956mDP IV COMPARISON:  CT chest, abdomen, and pelvis from same day. FINDINGS: There are no foci of increased or decreased radiotracer uptake to suggest osseous metastatic disease. 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 There is degenerative type uptake within both shoulders and the left ankle. Normal physiologic activity is identified within the kidneys and urinary bladder. IMPRESSION: No evidence of osseous metastatic disease. Electronically Signed   By: WiTitus Dubin.D.   On: 10/17/2017 16:11  Ct Abdomen Pelvis W Contrast  Result Date: 10/19/2017 CLINICAL DATA:  Initial staging for right breast cancer. EXAM: CT CHEST, ABDOMEN, AND PELVIS WITH CONTRAST TECHNIQUE: Multidetector CT imaging of the chest, abdomen and pelvis was performed following the standard protocol during bolus administration of intravenous contrast. CONTRAST:  157m OMNIPAQUE IOHEXOL 300 MG/ML  SOLN COMPARISON:  None FINDINGS: CT CHEST FINDINGS Cardiovascular: Heart size is normal. Aortic atherosclerosis  noted. Calcification in the RCA, LAD and left circumflex coronary artery noted. Mediastinum/Nodes: Normal appearance of the thyroid gland. The trachea appears patent and is midline. Normal appearance of the esophagus. No enlarged mediastinal or hilar lymph nodes. Lungs/Pleura: No pleural effusion. Calcified granuloma identified in the right middle lobe. Scarring noted within the lingula. No suspicious pulmonary nodules. Musculoskeletal: No suspicious bone lesions. There is diffuse skin thickening overlying the right breast. Infiltrative tumor throughout the right breast is again noted, image 25/2. Enlarged right axillary lymph nodes are identified. The largest has a short axis of 3.1 cm, image 16/2. No supraclavicular or retropectoral adenopathy identified. CT ABDOMEN PELVIS FINDINGS Hepatobiliary: Left lobe of liver hypodensity measures 11 mm and is favored to represent a simple cyst. No suspicious liver lesions identified. Gallbladder normal. No biliary dilatation. Pancreas: Normal appearance of the pancreas. Spleen: Normal in size without focal abnormality. Adrenals/Urinary Tract: The adrenal glands are normal. No kidney mass or hydronephrosis. Urinary bladder appears normal. Stomach/Bowel: Stomach is unremarkable. No pathologic dilatation of the small bowel loops. Normal appearance of the colon. Vascular/Lymphatic: Aortic atherosclerosis. No aneurysm. No abdominal or pelvic adenopathy. Reproductive: Uterus and adnexal structures are unremarkable. Other: No ascites or focal fluid collections. Musculoskeletal: Benign appearing chondroid lesion within the proximal left femur is identified measuring 2.7 cm, image 82/5. Favor enchondroma or bone infarct. 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 No aggressive bone lesions noted. IMPRESSION: 1. Infiltrative right breast mass with overlying skin thickening is identified compatible with known breast cancer. 2. Enlarged right axillary lymph nodes  compatible with metastatic adenopathy. 3. No additional sites of disease identified within the chest and no evidence for metastasis to the abdomen or pelvis. 4. Aortic atherosclerosis and 3 vessel coronary artery atherosclerotic calcifications. Aortic Atherosclerosis (ICD10-I70.0). Electronically Signed   By: TKerby MoorsM.D.   On: 10/19/2017 22:50   Mr Breast Bilateral W Wo Contrast Inc Cad  Result Date: 10/08/2017 CLINICAL DATA:  62year old female with recently diagnosed bilateral breast invasive mammary carcinoma. Ultrasound-guided biopsy of a 7.5 cm mass in the right breast at the 11:30 position revealed grade 2 invasive mammary carcinoma and ultrasound-guided biopsy an abnormal lymph node in the right axilla revealed metastatic mammary carcinoma. Ultrasound-guided biopsy of a mass in the left breast at the 2 o'clock position revealed grade 1 invasive ductal carcinoma. LABS:  Not applicable. EXAM: BILATERAL BREAST MRI WITH AND WITHOUT CONTRAST TECHNIQUE: Multiplanar, multisequence MR images of both breasts were obtained prior to and following the intravenous administration of 16 ml of MultiHance. THREE-DIMENSIONAL MR IMAGE RENDERING ON INDEPENDENT WORKSTATION: Three-dimensional MR images were rendered by post-processing of the original MR data on an independent workstation. The three-dimensional MR images were interpreted, and findings are reported in the following complete MRI report for this study. Three dimensional images were evaluated at the independent DynaCad workstation COMPARISON:  Previous exam(s). FINDINGS: Breast composition: b.  Scattered fibroglandular tissue. Background parenchymal enhancement: Mild. Right breast: There is both skin and trabecular edema with enlargement of the right breast as well as nipple retraction. A biopsy marking clip  is present in the upper-outer right breast at site of known biopsy proven malignancy, with the large irregular enhancing mass measuring 6.8 cm AP, 9.6  cm transverse, and 7.2 cm craniocaudal. This mass predominantly involves the anterior third of the central upper right breast (both upper outer and upper inner quadrants), however extends inferiorly to mainly involve the lower outer quadrant (subtracted image 88) and to a lesser extent the lower inner quadrant (subtraction image 105). Left breast: Biopsy changes with clip artifact and small amount of enhancement present at site of biopsy proven malignancy in the upper-outer left breast, with this area measuring approximately 0.9 cm AP, 0.8 cm transverse and 0.7 cm craniocaudal. No additional abnormal areas of enhancement identified in the left breast. Lymph nodes: 5 morphologically abnormal and enlarged level 1 lymph nodes are present in the right axilla, the largest of which measures up to 3.4 cm (subtraction image 185). Biopsy marking clip is felt to be located in a lymph node located slightly more anterior inferior to the largest lymph node (image 22). No lymphadenopathy seen in the left axilla. No internal mammary lymphadenopathy. Ancillary findings:  None. IMPRESSION: 1. Biopsy proven malignancy involving all 4 quadrants of the right breast, although predominantly located in the anterior third of the central upper right breast measures 6.8 x 9.6 x 7.2 cm. 2. Five pathologic right axillary lymph nodes, compatible with biopsy proven axillary metastases. 3. Small mass with associated biopsy related changes in the upper-outer left breast at site of known malignancy measures 0.9 x 0.8 x 0.7 cm. No abnormal lymph nodes seen in the left axilla. RECOMMENDATION: Treatment plan for known bilateral breast malignancy. BI-RADS CATEGORY  6: Known biopsy-proven malignancy. Electronically Signed   By: Everlean Alstrom M.D.   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 This was found to be concordant by Dr. Franki Cabot. 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 This was found to be concordant by Dr. Franki Cabot. Pathology results were discussed with the patient by telephone. The patient reported doing well after the biopsies with tenderness at the sites. Post biopsy instructions and care were reviewed and questions were answered. The patient was encouraged to call [facility] for any additional concerns. Surgical consultation has been arranged with Dr. [name] at [facility] on September 29, 2017. Pathology results reported by Terie Purser, RN on 09/25/2017. Electronically Signed   By: Franki Cabot M.D.   On: 09/25/2017 08:08   Result Date: 09/25/2017 CLINICAL DATA:  Patient with highly suspicious large RIGHT breast mass involving the outer and upper RIGHT breast, 9:00 to 12:00 to 1 o'clock axes, measuring at least 7 cm greatest dimension with associated involvement of the RIGHT nipple, presenting today for ultrasound-guided core biopsy. Patient also with enlarged lymph nodes in the RIGHT axilla presenting today for ultrasound-guided core biopsy. Lastly, patient also with an indeterminate 9 mm solid mass within the upper-outer quadrant of the LEFT breast for which patient also presents today for ultrasound-guided core biopsy. EXAM: ULTRASOUND GUIDED RIGHT BREAST CORE NEEDLE BIOPSY COMPARISON:  Previous exam(s). PROCEDURE: I met with the patient and we discussed the procedure of ultrasound-guided biopsy, including benefits and alternatives. We discussed the high likelihood of a successful procedure. We  discussed the risks of the procedure including infection, bleeding, tissue injury, clip migration, and inadequate sampling. Informed written consent was given. The usual time-out protocol was performed immediately prior to the  procedure. Site 1: Using sterile technique and 1% Lidocaine as local anesthetic, under direct ultrasound visualization, a 12 gauge spring-loaded device was used to perform biopsy of the LEFT breast mass at the 2 o'clock axisusing a lateral approach. At the conclusion of the procedure, a ribbon shaped tissue marker clip was deployed into the biopsy cavity. Lesion quadrant: Upper outer quadrant Site 2: Next, using sterile technique and 1% lidocaine as local anesthetic, under direct ultrasound visualization, a 12 gauge spring-loaded device was used to perform biopsy of the RIGHT breast mass at the 11:30 o'clock axis using a lateral approach. At the conclusion of the procedure, a heart shaped tissue marker clip was deployed in the biopsy cavity. Lesion quadrant: Upper outer quadrant Site 3: Next, using sterile technique and 1% lidocaine as local anesthetic, under direct ultrasound visualization, a 14 gauge spring-loaded device was used to perform biopsy of an enlarged RIGHT axillary lymph node using a lateral approach. At the conclusion of the procedure, a spiral shaped marker clip was deployed in the biopsy cavity. IMPRESSION: 1. Ultrasound-guided biopsy of the LEFT breast mass at the 2 o'clock axis. Ribbon shaped clip placed at the biopsy site. 2. Ultrasound-guided biopsy of the RIGHT breast mass at the 11:30 o'clock axis. Heart shaped clip placed at the biopsy site. 3. Ultrasound-guided biopsy of an enlarged lymph node in the RIGHT axilla. Spiral shaped clip placed at the biopsy site. No apparent complications. Electronically Signed: By: Franki Cabot M.D. On: 09/23/2017 15:44   Mm Clip Placement Left  Result Date: 09/23/2017 CLINICAL DATA:  Status post ultrasound-guided biopsy of a LEFT breast mass at the 2 o'clock axis. EXAM: DIAGNOSTIC LEFT MAMMOGRAM POST ULTRASOUND BIOPSY COMPARISON:  Previous exam(s). FINDINGS: Mammographic images were obtained following ultrasound guided biopsy of the LEFT breast mass at  the 2 o'clock axis. Ribbon shaped clip is appropriately positioned within the mass in the upper-outer quadrant. IMPRESSION: Ribbon shaped biopsy clip is appropriately positioned within the biopsied mass in the upper-outer quadrant of the LEFT breast, 2 o'clock axis. Final Assessment: Post Procedure Mammograms for Marker Placement Electronically Signed   By: Franki Cabot M.D.   On: 09/23/2017 16:03   Mm Clip Placement Right  Result Date: 10/13/2017 CLINICAL DATA:  Patient has biopsy proven right breast cancer metastatic axillary adenopathy. Ultrasound-guided core biopsies of additional lesions requested. EXAM: DIAGNOSTIC RIGHT MAMMOGRAM POST ULTRASOUND BIOPSIES COMPARISON:  Previous exam(s). FINDINGS: Mammographic images were obtained following ultrasound guided biopsy of the 1 o'clock and 7 o'clock region of the right breast. Mammographic images show there is a coil shaped clip in the upper-inner quadrant of the right breast and a heart shaped clip in the lower outer quadrant of the right breast. There is a ribbon shaped clip in the upper-outer quadrant of the right breast from a prior biopsy. The heart shaped clip is located 3.7 cm inferior to the ribbon shaped clip and the coil shaped clip is located 2.6 cm medial to the ribbon shaped clip. IMPRESSION: Status post ultrasound-guided core biopsies of the right breast with pathology pending. Final Assessment: Post Procedure Mammograms for Marker Placement Electronically Signed   By: Lillia Mountain M.D.   On: 10/13/2017 16:56   Mm Clip Placement Right  Result Date: 09/23/2017 CLINICAL DATA:  Status post biopsy of a large highly suspicious mass  in the upper-outer quadrant the RIGHT breast and an enlarged lymph node in the RIGHT axilla. EXAM: DIAGNOSTIC RIGHT MAMMOGRAM POST ULTRASOUND BIOPSY COMPARISON:  Previous exam(s). FINDINGS: Mammographic images were obtained following ultrasound guided biopsy of the large highly suspicious mass in the upper-outer quadrant of  the RIGHT breast. At the conclusion the procedure, a heart shaped clip was placed at the biopsy site. This clip is appropriately positioned in the mass. A coil shaped clip is well-positioned within the biopsied lymph node in the RIGHT axilla, as seen on postprocedure ultrasound. IMPRESSION: 1. Heart shaped clip appropriately positioned within the targeted mass in the upper-outer quadrant of the RIGHT breast. 2. Coil shaped clip is appropriately positioned within the targeted abnormal lymph node in the RIGHT axilla, as seen on postprocedure ultrasound. Final Assessment: Post Procedure Mammograms for Marker Placement Electronically Signed   By: Franki Cabot M.D.   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 This was found to be concordant by Dr. Franki Cabot. 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 This was found to be concordant by Dr. Franki Cabot. Pathology results were discussed with the patient by telephone. The patient reported doing well after the biopsies with tenderness at the sites. Post biopsy instructions and care were reviewed and questions were answered. The patient was encouraged to call [facility] for any additional concerns. Surgical consultation has been arranged with Dr. [name] at [facility] on September 29, 2017. Pathology results reported by Terie Purser, RN on 09/25/2017. Electronically Signed   By: Franki Cabot M.D.   On: 09/25/2017 08:08   Result Date: 09/25/2017 CLINICAL DATA:  Patient with highly suspicious large RIGHT breast mass involving the outer and upper RIGHT breast, 9:00 to 12:00 to 1 o'clock axes, measuring at least 7 cm greatest dimension with  associated involvement of the RIGHT nipple, presenting today for ultrasound-guided core biopsy. Patient also with enlarged lymph nodes in the RIGHT axilla presenting today for ultrasound-guided core biopsy. Lastly, patient also with an indeterminate 9 mm solid mass within the upper-outer quadrant of the LEFT breast for which patient also presents today for ultrasound-guided core biopsy. EXAM: ULTRASOUND GUIDED RIGHT BREAST CORE NEEDLE BIOPSY COMPARISON:  Previous exam(s). PROCEDURE: I met with the patient and we discussed the procedure of ultrasound-guided biopsy, including benefits and alternatives. We discussed the high likelihood of a successful procedure. We discussed the risks of the procedure including infection, bleeding, tissue injury, clip migration, and inadequate sampling. Informed written consent was given. The usual time-out protocol was performed immediately prior to the procedure. Site 1: Using sterile technique and 1% Lidocaine as local anesthetic, under direct ultrasound visualization, a 12 gauge spring-loaded device was used to perform biopsy of the LEFT breast mass at the 2 o'clock axisusing a lateral approach. At the conclusion of the procedure, a ribbon shaped tissue marker clip was deployed into the biopsy cavity. Lesion quadrant: Upper outer quadrant Site 2: Next, using sterile technique and 1% lidocaine as local anesthetic, under direct ultrasound visualization, a 12 gauge spring-loaded device was used to perform biopsy of the RIGHT breast mass at the 11:30 o'clock axis using a lateral approach. At the conclusion of the procedure,  a heart shaped tissue marker clip was deployed in the biopsy cavity. Lesion quadrant: Upper outer quadrant Site 3: Next, using sterile technique and 1% lidocaine as local anesthetic, under direct ultrasound visualization, a 14 gauge spring-loaded device was used to perform biopsy of an enlarged RIGHT axillary lymph node using a lateral approach. At the conclusion of  the procedure, a spiral shaped marker clip was deployed in the biopsy cavity. IMPRESSION: 1. Ultrasound-guided biopsy of the LEFT breast mass at the 2 o'clock axis. Ribbon shaped clip placed at the biopsy site. 2. Ultrasound-guided biopsy of the RIGHT breast mass at the 11:30 o'clock axis. Heart shaped clip placed at the biopsy site. 3. Ultrasound-guided biopsy of an enlarged lymph node in the RIGHT axilla. Spiral shaped clip placed at the biopsy site. No apparent complications. Electronically Signed: By: Franki Cabot M.D. On: 09/23/2017 15:44   [place] Rt Breast Bx W Loc Dev 1st Lesion Img Bx Spec US Guide  Addendum Date: 10/15/2017   ADDENDUM REPORT: 10/14/2017 13:52 ADDENDUM: Pathology revealed GRADE II INVASIVE DUCTAL CARCINOMA of the Right breast, both locations, 1 o'clock and 7 o'clock. This was found to be concordant by Dr. Lillia Mountain. Pathology results were discussed with the patient by telephone. The patient reported doing well after the biopsies with tenderness at the sites. Post biopsy instructions and care were reviewed and questions were answered. The patient was encouraged to call [facility] for any additional concerns. The patient has a recent diagnosis of bilateral breast cancer and should follow her outlined treatment plan. Dr. [name] was notified of results via EPIC message on Oct 14, 2017. Pathology results reported by Terie Purser, RN on 10/14/2017. Electronically Signed   By: Lillia Mountain M.D.   On: 10/14/2017 13:52   Result Date: 10/15/2017 CLINICAL DATA:  Biopsy proven bilateral invasive mammary carcinoma. Status post ultrasound-guided core biopsy of a mass in the 11:30 region of the right breast which revealed grade 2 invasive mammary carcinoma and ultrasound-guided core biopsy of a right axillary lymph node showing metastatic mammary carcinoma. Ultrasound-guided core biopsies of the 1 o'clock and 7 o'clock region of the right breast requested. EXAM:  ULTRASOUND GUIDED RIGHT BREAST CORE NEEDLE BIOPSY COMPARISON:  Previous exam(s). FINDINGS: I met with the patient and we discussed the procedure of ultrasound-guided biopsy, including benefits and alternatives. We discussed the high likelihood of a successful procedure. We discussed the risks of the procedure, including infection, bleeding, tissue injury, clip migration, and inadequate sampling. Informed written consent was given. The usual time-out protocol was performed immediately prior to the procedure. Lesion quadrant: Upper inner quadrant Using sterile technique and 1% Lidocaine as local anesthetic, under direct ultrasound visualization, a 12 gauge spring-loaded device was used to perform biopsy of a mass in the 1 o'clock region of the right breast using a medial to lateral approach. At the conclusion of the procedure a coil shaped tissue marker clip was deployed into the biopsy cavity. Follow up 2 view mammogram was performed and dictated separately. I met with the patient and we discussed the procedure of ultrasound-guided biopsy, including benefits and alternatives. We discussed the high likelihood of a successful procedure. We discussed the risks of the procedure, including infection, bleeding, tissue injury, clip migration, and inadequate sampling. Informed written consent was given. The usual time-out protocol was performed immediately prior to the procedure. Lesion quadrant: Lower outer quadrant Using sterile technique and 1% Lidocaine as local anesthetic, under direct ultrasound visualization, a 12 gauge  spring-loaded device was used to perform biopsy of the 7 o'clock region of the right breast using a lateral to medial approach. At the conclusion of the procedure a heart shaped tissue marker clip was deployed into the biopsy cavity. Follow up 2 view mammogram was performed and dictated separately. IMPRESSION: Ultrasound guided biopsies of the right breast. No apparent complications. Electronically  Signed: By: Lillia Mountain M.D. 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 This was found to be concordant by Dr. Franki Cabot. 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 This was found to be concordant by Dr. Franki Cabot. Pathology results were discussed with the patient by telephone. The patient reported doing well after the biopsies with tenderness at the sites. Post biopsy instructions and care were reviewed and questions were answered. The patient was encouraged to call [facility] for any additional concerns. Surgical consultation has been arranged with Dr. [name] at [facility] on September 29, 2017. Pathology results reported by Terie Purser, RN on 09/25/2017. Electronically Signed   By: Franki Cabot M.D.   On: 09/25/2017 08:08   Result Date: 09/25/2017 CLINICAL DATA:  Patient with highly suspicious large RIGHT breast mass involving the outer and upper RIGHT breast, 9:00 to 12:00 to 1 o'clock axes, measuring at least 7 cm greatest dimension with associated involvement of the RIGHT nipple, presenting today for ultrasound-guided core biopsy. Patient also with enlarged lymph nodes in the RIGHT axilla presenting today for ultrasound-guided core biopsy. Lastly, patient also with an indeterminate 9 mm solid mass within the upper-outer quadrant of the LEFT breast for which patient also presents today for ultrasound-guided core biopsy. EXAM: ULTRASOUND GUIDED RIGHT BREAST CORE NEEDLE BIOPSY COMPARISON:  Previous exam(s). PROCEDURE: I met with the patient and we discussed the procedure of ultrasound-guided biopsy, including benefits and alternatives. We discussed the high likelihood of a  successful procedure. We discussed the risks of the procedure including infection, bleeding, tissue injury, clip migration, and inadequate sampling. Informed written consent was given. The usual time-out protocol was performed immediately prior to the procedure. Site 1: Using sterile technique and 1% Lidocaine as local anesthetic, under direct ultrasound visualization, a 12 gauge spring-loaded device was used to perform biopsy of the LEFT breast mass at the 2 o'clock axisusing a lateral approach. At the conclusion of the procedure, a ribbon shaped tissue marker clip was deployed into the biopsy cavity. Lesion quadrant: Upper outer quadrant Site 2: Next, using sterile technique and 1% lidocaine as local anesthetic, under direct ultrasound visualization, a 12 gauge spring-loaded device was used to perform biopsy of the RIGHT breast mass at the 11:30 o'clock axis using a lateral approach. At the conclusion of the procedure, a heart shaped tissue marker clip was deployed in the biopsy cavity. Lesion quadrant: Upper outer quadrant Site 3: Next, using sterile technique and 1% lidocaine as local anesthetic, under direct ultrasound visualization, a 14 gauge spring-loaded device was used to perform biopsy of an enlarged RIGHT axillary lymph node using a lateral approach. At the conclusion of the procedure, a spiral shaped marker clip was deployed in the biopsy cavity. IMPRESSION: 1. Ultrasound-guided biopsy of the LEFT breast mass at the 2 o'clock axis.  Ribbon shaped clip placed at the biopsy site. 2. Ultrasound-guided biopsy of the RIGHT breast mass at the 11:30 o'clock axis. Heart shaped clip placed at the biopsy site. 3. Ultrasound-guided biopsy of an enlarged lymph node in the RIGHT axilla. Spiral shaped clip placed at the biopsy site. No apparent complications. Electronically Signed: By: Franki Cabot M.D. On: 09/23/2017 15:44   [place] Rt Breast Bx W Loc Dev Ea Add Lesion Img Bx Spec US Guide  Addendum Date: 10/15/2017     ADDENDUM REPORT: 10/14/2017 13:52 ADDENDUM: Pathology revealed GRADE II INVASIVE DUCTAL CARCINOMA of the Right breast, both locations, 1 o'clock and 7 o'clock. This was found to be concordant by Dr. Lillia Mountain. Pathology results were discussed with the patient by telephone. The patient reported doing well after the biopsies with tenderness at the sites. Post biopsy instructions and care were reviewed and questions were answered. The patient was encouraged to call [facility] for any additional concerns. The patient has a recent diagnosis of bilateral breast cancer and should follow her outlined treatment plan. Dr. [name] was notified of results via EPIC message on Oct 14, 2017. Pathology results reported by Terie Purser, RN on 10/14/2017. Electronically Signed   By: Lillia Mountain M.D.   On: 10/14/2017 13:52   Result Date: 10/15/2017 CLINICAL DATA:  Biopsy proven bilateral invasive mammary carcinoma. Status post ultrasound-guided core biopsy of a mass in the 11:30 region of the right breast which revealed grade 2 invasive mammary carcinoma and ultrasound-guided core biopsy of a right axillary lymph node showing metastatic mammary carcinoma. Ultrasound-guided core biopsies of the 1 o'clock and 7 o'clock region of the right breast requested. EXAM: ULTRASOUND GUIDED RIGHT BREAST CORE NEEDLE BIOPSY COMPARISON:  Previous exam(s). FINDINGS: I met with the patient and we discussed the procedure of ultrasound-guided biopsy, including benefits and alternatives. We discussed the high likelihood of a successful procedure. We discussed the risks of the procedure, including infection, bleeding, tissue injury, clip migration, and inadequate sampling. Informed written consent was given. The usual time-out protocol was performed immediately prior to the procedure. Lesion quadrant: Upper inner quadrant Using sterile technique and 1% Lidocaine as local anesthetic, under direct ultrasound  visualization, a 12 gauge spring-loaded device was used to perform biopsy of a mass in the 1 o'clock region of the right breast using a medial to lateral approach. At the conclusion of the procedure a coil shaped tissue marker clip was deployed into the biopsy cavity. Follow up 2 view mammogram was performed and dictated separately. I met with the patient and we discussed the procedure of ultrasound-guided biopsy, including benefits and alternatives. We discussed the high likelihood of a successful procedure. We discussed the risks of the procedure, including infection, bleeding, tissue injury, clip migration, and inadequate sampling. Informed written consent was given. The usual time-out protocol was performed immediately prior to the procedure. Lesion quadrant: Lower outer quadrant Using sterile technique and 1% Lidocaine as local anesthetic, under direct ultrasound visualization, a 12 gauge spring-loaded device was used to perform biopsy of the 7 o'clock region of the right breast using a lateral to medial approach. At the conclusion of the procedure a heart shaped tissue marker clip was deployed into the biopsy cavity. Follow up 2 view mammogram was performed and dictated separately. IMPRESSION: Ultrasound guided biopsies of the right breast. No apparent complications. Electronically Signed: By: Lillia Mountain M.D. On: 10/13/2017 16:52    Labs:  CBC: Recent Labs    09/10/17  1243 10/17/17 1117  WBC 8.0 7.3  HGB 14.6 13.9  HCT 42.3 41.7  PLT 225 192    COAGS: No results for input(s): INR, APTT in the last 8760 hours.  BMP: Recent Labs    09/10/17 1243 10/17/17 1117  NA 143 142  K 4.9 4.6  CL 106 108  CO2 18* 27  GLUCOSE 91 95  BUN 25 24  CALCIUM 9.5 9.6  CREATININE 0.98 0.85  GFRNONAA 62 >60  GFRAA 71 >60    LIVER FUNCTION TESTS: Recent Labs    09/10/17 1243 10/17/17 1117  BILITOT 0.4 0.6  AST 19 17  ALT 18 21  ALKPHOS 68 62  PROT 6.5 6.5  ALBUMIN 4.8 4.0    TUMOR  MARKERS: No results for input(s): AFPTM, CEA, CA199, CHROMGRNA in the last 8760 hours.  Assessment and Plan: 62 y.o. female smoker  with history of recently diagnosed bilateral breast cancers who presents today for Port-A-Cath placement for chemotherapy.Risks and benefits of image guided port-a-catheter placement was discussed with the patient including, but not limited to bleeding, infection, pneumothorax, or fibrin sheath development and need for additional procedures.  All of the patient's questions were answered, patient is agreeable to proceed. Consent signed and in chart.  Labs pending   Thank you for this interesting consult.  I greatly enjoyed meeting Darlene Beasley and look forward to participating in their care.  A copy of this report was sent to the requesting provider on this date.  Electronically Signed: D. Rowe Robert, PA-C 10/20/2017, 8:47 AM   I spent a total of  25 minutes   in face to face in clinical consultation, greater than 50% of which was counseling/coordinating care for port a cath placement

## 2017-10-21 ENCOUNTER — Other Ambulatory Visit: Payer: Self-pay | Admitting: Hematology

## 2017-10-22 ENCOUNTER — Encounter: Payer: Self-pay | Admitting: Nurse Practitioner

## 2017-10-22 ENCOUNTER — Inpatient Hospital Stay: Payer: Medicaid Other

## 2017-10-22 ENCOUNTER — Encounter: Payer: Self-pay | Admitting: *Deleted

## 2017-10-22 ENCOUNTER — Telehealth: Payer: Self-pay | Admitting: Hematology

## 2017-10-22 ENCOUNTER — Telehealth: Payer: Self-pay | Admitting: Nurse Practitioner

## 2017-10-22 ENCOUNTER — Inpatient Hospital Stay (HOSPITAL_BASED_OUTPATIENT_CLINIC_OR_DEPARTMENT_OTHER): Payer: Medicaid Other | Admitting: Nurse Practitioner

## 2017-10-22 VITALS — BP 169/83 | HR 74 | Temp 98.1°F | Resp 17 | Ht 64.0 in | Wt 172.3 lb

## 2017-10-22 DIAGNOSIS — Z9011 Acquired absence of right breast and nipple: Secondary | ICD-10-CM | POA: Diagnosis not present

## 2017-10-22 DIAGNOSIS — Z170 Estrogen receptor positive status [ER+]: Secondary | ICD-10-CM

## 2017-10-22 DIAGNOSIS — C50811 Malignant neoplasm of overlapping sites of right female breast: Secondary | ICD-10-CM

## 2017-10-22 DIAGNOSIS — Z006 Encounter for examination for normal comparison and control in clinical research program: Secondary | ICD-10-CM

## 2017-10-22 DIAGNOSIS — F1290 Cannabis use, unspecified, uncomplicated: Secondary | ICD-10-CM

## 2017-10-22 DIAGNOSIS — C50412 Malignant neoplasm of upper-outer quadrant of left female breast: Secondary | ICD-10-CM

## 2017-10-22 DIAGNOSIS — Z5111 Encounter for antineoplastic chemotherapy: Secondary | ICD-10-CM | POA: Diagnosis not present

## 2017-10-22 DIAGNOSIS — Z95828 Presence of other vascular implants and grafts: Secondary | ICD-10-CM

## 2017-10-22 DIAGNOSIS — Z79899 Other long term (current) drug therapy: Secondary | ICD-10-CM

## 2017-10-22 LAB — CMP (CANCER CENTER ONLY)
ALBUMIN: 3.7 g/dL (ref 3.5–5.0)
ALT: 16 U/L (ref 0–55)
AST: 14 U/L (ref 5–34)
Alkaline Phosphatase: 62 U/L (ref 40–150)
Anion gap: 6 (ref 3–11)
BUN: 20 mg/dL (ref 7–26)
CALCIUM: 9.2 mg/dL (ref 8.4–10.4)
CHLORIDE: 108 mmol/L (ref 98–109)
CO2: 27 mmol/L (ref 22–29)
Creatinine: 0.82 mg/dL (ref 0.60–1.10)
GFR, Est AFR Am: >60 mL/min (ref >60)
GFR, Estimated: >60 mL/min (ref >60)
Glucose, Bld: 92 mg/dL (ref 70–140)
POTASSIUM: 4 mmol/L (ref 3.5–5.1)
SODIUM: 141 mmol/L (ref 136–145)
TOTAL PROTEIN: 6.2 g/dL — AB (ref 6.4–8.3)
Total Bilirubin: 0.5 mg/dL (ref 0.2–1.2)

## 2017-10-22 LAB — CBC WITH DIFFERENTIAL (CANCER CENTER ONLY)
BASOS ABS: 0.1 10*3/uL (ref 0.0–0.1)
BASOS PCT: 1 %
EOS PCT: 4 %
Eosinophils Absolute: 0.2 10*3/uL (ref 0.0–0.5)
HCT: 42 % (ref 34.8–46.6)
Hemoglobin: 14.1 g/dL (ref 11.6–15.9)
LYMPHS PCT: 36 %
Lymphs Abs: 2.2 10*3/uL (ref 0.9–3.3)
MCH: 30.5 pg (ref 25.1–34.0)
MCHC: 33.6 g/dL (ref 31.5–36.0)
MCV: 90.7 fL (ref 79.5–101.0)
Monocytes Absolute: 0.3 10*3/uL (ref 0.1–0.9)
Monocytes Relative: 6 %
NEUTROS ABS: 3.4 10*3/uL (ref 1.5–6.5)
Neutrophils Relative %: 53 %
PLATELETS: 198 10*3/uL (ref 145–400)
RBC: 4.63 MIL/uL (ref 3.70–5.45)
RDW: 14.1 % (ref 11.2–14.5)
WBC: 6.2 10*3/uL (ref 3.9–10.3)

## 2017-10-22 LAB — RESEARCH LABS

## 2017-10-22 MED ORDER — DOXORUBICIN HCL CHEMO IV INJECTION 2 MG/ML
60.0000 mg/m2 | Freq: Once | INTRAVENOUS | Status: AC
  Administered 2017-10-22: 112 mg via INTRAVENOUS
  Filled 2017-10-22: qty 56

## 2017-10-22 MED ORDER — SODIUM CHLORIDE 0.9% FLUSH
10.0000 mL | INTRAVENOUS | Status: DC | PRN
  Administered 2017-10-22: 10 mL
  Filled 2017-10-22: qty 10

## 2017-10-22 MED ORDER — SODIUM CHLORIDE 0.9% FLUSH
10.0000 mL | Freq: Once | INTRAVENOUS | Status: AC
  Administered 2017-10-22: 10 mL
  Filled 2017-10-22: qty 10

## 2017-10-22 MED ORDER — HEPARIN SOD (PORK) LOCK FLUSH 100 UNIT/ML IV SOLN
500.0000 [IU] | Freq: Once | INTRAVENOUS | Status: AC | PRN
  Administered 2017-10-22: 500 [IU]
  Filled 2017-10-22: qty 5

## 2017-10-22 MED ORDER — CYCLOPHOSPHAMIDE CHEMO INJECTION 1 GM
600.0000 mg/m2 | Freq: Once | INTRAMUSCULAR | Status: AC
  Administered 2017-10-22: 1120 mg via INTRAVENOUS
  Filled 2017-10-22: qty 56

## 2017-10-22 MED ORDER — PALONOSETRON HCL INJECTION 0.25 MG/5ML
INTRAVENOUS | Status: AC
  Filled 2017-10-22: qty 5

## 2017-10-22 MED ORDER — PALONOSETRON HCL INJECTION 0.25 MG/5ML
0.2500 mg | Freq: Once | INTRAVENOUS | Status: AC
  Administered 2017-10-22: 0.25 mg via INTRAVENOUS

## 2017-10-22 MED ORDER — SODIUM CHLORIDE 0.9 % IV SOLN
Freq: Once | INTRAVENOUS | Status: AC
  Administered 2017-10-22: 12:00:00 via INTRAVENOUS

## 2017-10-22 MED ORDER — SODIUM CHLORIDE 0.9 % IV SOLN
Freq: Once | INTRAVENOUS | Status: AC
  Administered 2017-10-22: 12:00:00 via INTRAVENOUS
  Filled 2017-10-22: qty 5

## 2017-10-22 NOTE — Progress Notes (Signed)
[facility]  [phone] [phone]  Clinic Follow up Note   Patient Care Team: [name], MD as PCP - General (Family Medicine) [name], MD as Consulting Physician (General Surgery) [name], MD as Consulting Physician (Hematology) 10/22/2017  SUMMARY OF ONCOLOGIC HISTORY: Oncology History   Cancer Staging Cancer of overlapping sites of right female breast [facility]) Staging form: Breast, AJCC 8th Edition - Clinical stage from 09/23/2017: Stage IIIA (cT3, cN1, cM0, G2, ER+, PR-, HER2-) - Signed by [name], MD on 10/09/2017 - Clinical: cT3 - Unsigned       Cancer of overlapping sites of right female breast ([place])   09/09/2017 Mammogram    IMPRESSION: 1. Highly suspicious large right breast mass involving the outer and upper breast extending from approximately 9 o'clock through 12-1 o'clock, maximum measurement approximating 7.5 cm. The mass involves the right nipple, accounting for nipple retraction. Architectural distortion, microcalcifications and diffuse trabecular thickening and skin thickening are associated with the large mass, raising the possibility of this representing an inflammatory cancer. 2. Satellite masses separate from the dominant mass involving the lower outer quadrant and the upper inner quadrant of the right breast, measured above. 3. Two adjacent pathologic right axillary lymph nodes. 4. Indeterminate 0.9 cm solid mass involving the upper outer quadrant of the left breast which accounts for a mammographic finding. 5. No pathologic left axillary lymphadenopathy.      09/09/2017 Breast US    Targeted right breast ultrasound is performed, showing a very large hypoechoic mass with irregular margins extending from the approximate 9 o'clock position through the 12 to 1 o'clock position. On the CC gait image, the mass measures maximally approximately 7.5 cm. The mass has irregular margins, demonstrates acoustic shadowing, and  demonstrates internal power Doppler flow. The mass extends into the retroareolar region and involves the nipple, accounting for the nipple retraction.  Separate from the dominant mass at the 7 o'clock position approximately 4 cm from nipple is a hypoechoic antiparallel mass with irregular margins measuring approximately 1.4 x 2.7 x 1.7 cm.  Separate from the dominant mass at the 1 o'clock position approximately 2 cm from nipple is a hypoechoic mass with irregular margins measuring approximately 1.7 x 0.5 x 1.6 cm. Both of these masses also demonstrate internal power Doppler flow.  Sonographic evaluation of the right axilla demonstrates 2 adjacent pathologic lymph nodes, the larger measuring approximately 2.5 x 2.5 x 2.8 cm, the smaller measuring approximately 2.0 x 0.8 x 2.7 cm.      09/23/2017 Initial Biopsy    Diagnosis 1. Breast, left, needle core biopsy, 2 o'clock - INVASIVE DUCTAL CARCINOMA. - SEE COMMENT. 2. Breast, right, needle core biopsy, 11:30 o'clock - INVASIVE MAMMARY CARCINOMA. - SEE COMMENT. 3. Lymph node, needle/core biopsy, right axilla - METASTATIC MAMMARY CARCINOMA IN 1 OF 1 LYMPH NODE (1/1). Microscopic Comment 1. There is a small focus of grade I invasive ductal carcinoma. A complete breast prognostic profile will be attempted and the results reported separately. The results were called to the [facility] on 09/24/2017. 2. The carcinoma appears grade II. An E-Cadherin stain and a breast prognostic profile will be performed on part 2 and the results reported separately. (JBK:kh 09-24-17) [name]  1. HER2 Negative by FISH Estrogen receptor: 100% positive, strong staining Progesterone receptor: 80% positive, strong staining Ki67: 5%  2. HER2 Negative by FISH Estrogen receptor: 100% positive, strong staining Progesterone receptor: 0%, negative  Ki67: 30% 2. The tumor  cells are strongly positive for E-cadherin, supporting a ductal phenotype       09/23/2017 Cancer Staging    Staging form: Breast, AJCC 8th Edition - Clinical stage from 09/23/2017: Stage IIIA (cT3, cN1, cM0, G2, ER+, PR-, HER2-) - Signed by [name], MD on 10/09/2017      10/07/2017 Breast MRI    IMPRESSION: 1. Biopsy proven malignancy involving all 4 quadrants of the right breast, although predominantly located in the anterior third of the central upper right breast measures 6.8 x 9.6 x 7.2 cm.  2. Five pathologic right axillary lymph nodes, compatible with biopsy proven axillary metastases.  3. Small mass with associated biopsy related changes in the upper-outer left breast at site of known malignancy measures 0.9 x 0.8 x 0.7 cm. No abnormal lymph nodes seen in the left axilla.  RECOMMENDATION: Treatment plan for known bilateral breast malignancy.  BI-RADS CATEGORY  6: Known biopsy-proven malignancy.      10/08/2017 Initial Diagnosis    Cancer of overlapping sites of right female breast ([place])      10/13/2017 Pathology Results    Diagnosis 1. Breast, right, needle core biopsy, 1 o'clock - INVASIVE DUCTAL CARCINOMA. - SEE COMMENT. 2. Breast, right, needle core biopsy, 7 o'clock - INVASIVE DUCTAL CARCINOMA. - SEE COMMENT. Microscopic Comment 1. and 2. The carcinoma in parts 1 and 2 is morphologically similar and appears grade II. Because breast prognostic profiles were performed on the patients previous case, [id number], it will not be repeated on the current case unless requested.       10/17/2017 Imaging    CT CAP IMPRESSION: 1. Infiltrative right breast mass with overlying skin thickening is identified compatible with known breast cancer. 2. Enlarged right axillary lymph nodes compatible with metastatic adenopathy. 3. No additional sites of disease identified within the chest and no evidence for metastasis to the abdomen or pelvis. 4. Aortic atherosclerosis and 3 vessel coronary artery atherosclerotic calcifications. Aortic  Atherosclerosis (ICD10-I70.0).      10/17/2017 Imaging    Bone Scan FINDINGS: There are no foci of increased or decreased radiotracer uptake to suggest osseous metastatic disease. 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 There is degenerative type uptake within both shoulders and the left ankle.  Normal physiologic activity is identified within the kidneys and urinary bladder.  IMPRESSION: No evidence of osseous metastatic disease.      10/21/2017 -  Chemotherapy    The patient had DOXOrubicin (ADRIAMYCIN) chemo injection 112 mg, 60 mg/m2 = 112 mg, Intravenous,  Once, 1 of 4 cycles Administration: 112 mg (10/22/2017) palonosetron (ALOXI) injection 0.25 mg, 0.25 mg, Intravenous,  Once, 1 of 4 cycles Administration: 0.25 mg (10/22/2017) pegfilgrastim-cbqv (UDENYCA) injection 6 mg, 6 mg, Subcutaneous, Once, 1 of 4 cycles cyclophosphamide (CYTOXAN) 1,120 mg in sodium chloride 0.9 % 250 mL chemo infusion, 600 mg/m2 = 1,120 mg, Intravenous,  Once, 1 of 4 cycles Administration: 1,120 mg (10/22/2017) PACLitaxel (TAXOL) 150 mg in sodium chloride 0.9 % 250 mL chemo infusion (</= 29m/m2), 80 mg/m2, Intravenous,  Once, 0 of 12 cycles fosaprepitant (EMEND) 150 mg, dexamethasone (DECADRON) 12 mg in sodium chloride 0.9 % 145 mL IVPB, , Intravenous,  Once, 1 of 4 cycles Administration:  (10/22/2017)  for chemotherapy treatment.        Malignant neoplasm of left breast in female, estrogen receptor positive ([place]   09/09/2017 Breast [place]   Targeted left breast  ultrasound is performed, showing an oval parallel hypoechoic mass containing microcalcifications at the 2 o'clock position approximately 6 cm from nipple measuring approximately 0.9 x 0.5 x 0.9 cm, corresponding to the mammographic finding.  Sonographic evaluation of the left axilla demonstrates no pathologic lymphadenopathy.      09/23/2017 Initial  Biopsy    Diagnosis 1. Breast, left, needle core biopsy, 2 o'clock - INVASIVE DUCTAL CARCINOMA. - SEE COMMENT. 2. Breast, right, needle core biopsy, 11:30 o'clock - INVASIVE MAMMARY CARCINOMA. - SEE COMMENT. 3. Lymph node, needle/core biopsy, right axilla - METASTATIC MAMMARY CARCINOMA IN 1 OF 1 LYMPH NODE (1/1). Microscopic Comment 1. There is a small focus of grade I invasive ductal carcinoma. A complete breast prognostic profile will be attempted and the results reported separately. The results were called to the [facility] on 09/24/2017. 2. The carcinoma appears grade II. An E-Cadherin stain and a breast prognostic profile will be performed on part 2 and the results reported separately. (JBK:kh 09-24-17) [name]  1. HER2 Negative by FISH Estrogen receptor: 100% positive, strong staining Progesterone receptor: 80% positive, strong staining Ki67: 5%  2. HER2 Negative by FISH Estrogen receptor: 100% positive, strong staining Progesterone receptor: 0%, negative  Ki67: 30%      10/08/2017 Initial Diagnosis    Malignant neoplasm of left breast in female, estrogen receptor positive ([place])     CURRENT THERAPY: Neoadjuvant AC with Udenyca q2 weeks x4 cycles followed by weekly Taxol x12 cycles started 10/22/17.   INTERVAL HISTORY: Ms. Sivertsen returns for follow-up and cycle 1 AC as scheduled.  She presents with her partner.  He had PAC placed, additional breast biopsy, attended chemo class, and had staging work-up since last visit on 10/09/2017.  Tolerated the port placement well, denies pain.  Mild right breast soreness at the biopsy site.  She saw surgeon Dr. Dalbert Batman yesterday who reviewed her imaging and pathology with her.  Feels prepared for chemo, has good appetite, no fatigue.  Bowels moving normally.  She has chronic cough with clear phlegm at baseline.  She continues to quit smoking.  REVIEW OF SYSTEMS:   Constitutional: Denies fatigue, fevers, chills or abnormal  weight loss (+) good appetite Eyes: Denies blurriness of vision Ears, nose, mouth, throat, and face: Denies mucositis or sore throat Respiratory: Denies  dyspnea or wheezes (+) chronic cough with clear phlegm, at baseline  cardiovascular: Denies palpitation, chest discomfort or lower extremity swelling Gastrointestinal:  Denies nausea, vomiting, constipation, diarrhea, heartburn or change in bowel habits Skin: Denies abnormal skin rashes Lymphatics: Denies new lymphadenopathy or easy bruising Neurological:Denies numbness, tingling or new weaknesses Behavioral/Psych: Mood is stable, no new changes  MSK: (+) arthritic pain  Breasts: (+) breast remains firm (+) right nipple inversion (+) mild soreness s/p biopsy  All other systems were reviewed with the patient and are negative.  MEDICAL HISTORY:  Past Medical History:  Diagnosis Date  . Arthritis   . breast ca dx'd 08/2017  . GERD (gastroesophageal reflux disease)     SURGICAL HISTORY: Past Surgical History:  Procedure Laterality Date  . IR FLUORO GUIDE PORT INSERTION LEFT  10/20/2017  . IR US GUIDE VASC ACCESS LEFT  10/20/2017  . TONSILLECTOMY Bilateral   . TUBAL LIGATION      I have reviewed the social history and family history with the patient and they are unchanged from previous note.  ALLERGIES:  is allergic to bee venom.  MEDICATIONS:  Current Outpatient Medications  Medication Sig Dispense Refill  .  lidocaine-prilocaine (EMLA) cream Apply to affected area once 30 g 3  . ondansetron (ZOFRAN) 8 MG tablet Take 1 tablet (8 mg total) by mouth 2 (two) times daily as needed. Start on the third day after chemotherapy. 30 tablet 1  . prochlorperazine (COMPAZINE) 10 MG tablet Take 1 tablet (10 mg total) by mouth every 6 (six) hours as needed (Nausea or vomiting). 30 tablet 1   No current facility-administered medications for this visit.    Facility-Administered Medications Ordered in Other Visits  Medication Dose Route Frequency  Provider Last Rate Last Dose  . sodium chloride flush (NS) 0.9 % injection 10 mL  10 mL Intracatheter PRN [name], MD   10 mL at 10/22/17 1418    PHYSICAL EXAMINATION: ECOG PERFORMANCE STATUS: 1 - Symptomatic but completely ambulatory  Vitals:   10/22/17 1014  BP: (!) 169/83  Pulse: 74  Resp: 17  Temp: 98.1 F (36.7 C)  SpO2: 97%   Filed Weights   10/22/17 1014  Weight: 172 lb 4.8 oz (78.2 kg)    GENERAL:alert, no distress and comfortable SKIN: skin color, texture, turgor are normal, no rashes or significant lesions EYES: normal, Conjunctiva are pink and non-injected, sclera clear OROPHARYNX:no exudate, no erythema and lips, buccal mucosa, and tongue normal  LYMPH:  no palpable cervical or supraclavicular lymphadenopathy LUNGS: clear to auscultation with normal breathing effort HEART: regular rate & rhythm and no murmurs and no lower extremity edema ABDOMEN:abdomen soft, non-tender and normal bowel sounds Musculoskeletal:no cyanosis of digits and no clubbing  NEURO: alert & oriented x 3 with fluent speech, no focal motor/sensory deficits BREASTS: inspection shows right breast to be larger than left with nipple inversion and skin edema consistent with peau d'orange. There is an irregular, firm mass measuring approximately 8 cm spanning the upper outer and upper inner quadrants and extending down to the lower outer quadrant involving the nipple. Mild ecchymosis at the 1 o'clock and 7 o'clock biopsy sites. There is a moveable 1.5 - 2 cm right axillary lymph node. No palpable mass in left breast or axilla that I could appreciate.  PAC healing well   LABORATORY DATA:  I have reviewed the data as listed CBC Latest Ref Rng & Units 10/22/2017 10/20/2017 10/17/2017  WBC 3.9 - 10.3 K/uL 6.2 6.0 7.3  Hemoglobin 11.6 - 15.9 g/dL 14.1 14.4 13.9  Hematocrit 34.8 - 46.6 % 42.0 41.5 41.7  Platelets 145 - 400 K/uL 198 216 192     CMP Latest Ref Rng & Units 10/22/2017 10/17/2017 09/10/2017   Glucose 70 - 140 mg/dL 92 95 91  BUN 7 - 26 mg/dL _0 Creatinine 0.60 - 1.10 mg/dL 0.82 0.85 0.98  Sodium 136 - 145 mmol/L 141 142 143  Potassium 3.5 - 5.1 mmol/L 4.0 4.6 4.9  Chloride 98 - 109 mmol/L 108 108 106  CO2 22 - 29 mmol/L 27 27 18(L)  Calcium 8.4 - 10.4 mg/dL 9.2 9.6 9.5  Total Protein 6.4 - 8.3 g/dL 6.2(L) 6.5 6.5  Total Bilirubin 0.2 - 1.2 mg/dL 0.5 0.6 0.4  Alkaline Phos 40 - 150 U/L 62 62 68  AST 5 - 34 U/L _1 ALT 0 - 55 U/L _2 PATHOLOGY Diagnosis 09/23/17 1. Breast, left, needle core biopsy, 2 o'clock - INVASIVE DUCTAL CARCINOMA. - SEE COMMENT. 2. Breast, right, needle core biopsy, 11:30 o'clock - INVASIVE MAMMARY CARCINOMA. - SEE COMMENT. 3. Lymph node, needle/core biopsy, right axilla -  METASTATIC MAMMARY CARCINOMA IN 1 OF 1 LYMPH NODE (1/1). Microscopic Comment 1. There is a small focus of grade I invasive ductal carcinoma. A complete breast prognostic profile will be attempted and the results reported separately. The results were called to the [facility] on 09/24/2017. 2. The carcinoma appears grade II. An E-Cadherin stain and a breast prognostic profile will be performed on part 2 and the results reported separately. (JBK:kh 09-24-17) [name]  1. HER2 Negative by FISH Estrogen receptor: 100% positive, strong staining Progesterone receptor: 80% positive, strong staining Ki67: 5%  2. HER2 Negative by FISH Estrogen receptor: 100% positive, strong staining Progesterone receptor: 0%, negative  Ki67: 30% 2. The tumor cells are strongly positive for E-cadherin, supporting a ductal phenotype  Diagnosis 10/13/17  1. Breast, right, needle core biopsy, 1 o'clock - INVASIVE DUCTAL CARCINOMA. - SEE COMMENT. 2. Breast, right, needle core biopsy, 7 o'clock - INVASIVE DUCTAL CARCINOMA. - SEE COMMENT.  RADIOGRAPHIC STUDIES: I have personally reviewed the radiological images as listed and agreed with the findings in the  report. No results found.   ASSESSMENT & PLAN: 62 year old female with history of substance use presented with right nipple inversion for 3 months.   1. 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 Malignant neoplasm of left breast in female - invasive ductal carcinoma, grade I, ER 100% positive, PR 80% positive, HER2 negative, Ki67 5% -Ms. Marschner appears stable. I discussed additional right breast biopsies at the 1 o'clock and 7 o'clock positions confirmed invasive ductal carcinoma. She has confirmed malignancy in all 4 quadrants of right breast and axilla. CT CAP and bone scan are negative for distant metastasis.  -Dr. Dalbert Batman recommends right modified radical mastectomy, on the left lumpectomy with SLNB vs mastectomy. He recommends delay reconstruction until after adjuvant radiation.  -Will continue current plan for neoadjuvant chemotherapy with AC q2 weeks x4 cycles followed by weekly taxol x12. Will monitor her breast mass with each treatment. May obtain interim imaging depending on her response to therapy. Plan to repeat MRI after completion of therapy prior to surgery.  -Her baseline echo shows grade 1 diastolic dysfunction, EF 31-49%, GLS -21% -Labs reviewed, adequate to proceed with cycle 1 AC, udenyca on day 3; we reviewed symptom management and reasons to call [facility] if she develops new or worsening concerns.   3. Social support -She presents with her partner today. She plans to work on her week off chemo for income. She has met with Marguarite Arbour our financial advocate, has been approved for [employer]. She also plans to apply for [employer] Medicaid.   4. Substance use  She smokes marijuana to control pain and help her sleep. I encouraged her to stop smoking marijuana and cigarettes completely. She is at risk for pulmonary infections while on  chemotherapy. She understands. Will try.   PLAN -Labs reviewed, proceed with cycle 1 AC, udenyca on day 3 -f/u in 2 weeks with cycle 2  All questions were answered. The patient knows to call the clinic with any problems, questions or concerns. No barriers to learning was detected. I spent 20 minutes counseling the patient face to face. The total time spent in the appointment was 25 minutes and more than 50% was on counseling and review of test results     Alla Feeling, NP 10/22/17

## 2017-10-22 NOTE — Telephone Encounter (Signed)
Unable to reach pt due to VM not being set up. Patient will receive updated copy of schedule in treatment area. Patient scheduled per 5/15 sch message

## 2017-10-22 NOTE — Patient Instructions (Signed)
[facility] Discharge Instructions for Patients Receiving Chemotherapy  Today you received the following chemotherapy agents Doxorubicin, Cyclophosphamide  To help prevent nausea and vomiting after your treatment, we encourage you to take your nausea medication as directed   If you develop nausea and vomiting that is not controlled by your nausea medication, call the clinic.   BELOW ARE SYMPTOMS THAT SHOULD BE REPORTED IMMEDIATELY:  *FEVER GREATER THAN 100.5 F  *CHILLS WITH OR WITHOUT FEVER  NAUSEA AND VOMITING THAT IS NOT CONTROLLED WITH YOUR NAUSEA MEDICATION  *UNUSUAL SHORTNESS OF BREATH  *UNUSUAL BRUISING OR BLEEDING  TENDERNESS IN MOUTH AND THROAT WITH OR WITHOUT PRESENCE OF ULCERS  *URINARY PROBLEMS  *BOWEL PROBLEMS  UNUSUAL RASH Items with * indicate a potential emergency and should be followed up as soon as possible.  Feel free to call the clinic should you have any questions or concerns. The clinic phone number is [phone].  Please show the [facility] at check-in to the Emergency Department and triage nurse.   Doxorubicin injection What is this medicine? DOXORUBICIN (dox oh ROO bi sin) is a chemotherapy drug. It is used to treat many kinds of cancer like leukemia, lymphoma, neuroblastoma, sarcoma, and Wilms' tumor. It is also used to treat bladder cancer, breast cancer, lung cancer, ovarian cancer, stomach cancer, and thyroid cancer. This medicine may be used for other purposes; ask your health care provider or pharmacist if you have questions. COMMON BRAND NAME(S): Adriamycin, Adriamycin PFS, Adriamycin RDF, Rubex What should I tell my health care provider before I take this medicine? They need to know if you have any of these conditions: -heart disease -history of low blood counts caused by a medicine -liver disease -recent or ongoing radiation therapy -an unusual or allergic reaction to doxorubicin, other chemotherapy agents, other  medicines, foods, dyes, or preservatives -pregnant or trying to get pregnant -breast-feeding How should I use this medicine? This drug is given as an infusion into a vein. It is administered in a hospital or clinic by a specially trained health care professional. If you have pain, swelling, burning or any unusual feeling around the site of your injection, tell your health care professional right away. Talk to your pediatrician regarding the use of this medicine in children. Special care may be needed. Overdosage: If you think you have taken too much of this medicine contact a poison control center or emergency room at once. NOTE: This medicine is only for you. Do not share this medicine with others. What if I miss a dose? It is important not to miss your dose. Call your doctor or health care professional if you are unable to keep an appointment. What may interact with this medicine? This medicine may interact with the following medications: -6-mercaptopurine -paclitaxel -phenytoin -St. John's Wort -trastuzumab -verapamil This list may not describe all possible interactions. Give your health care provider a list of all the medicines, herbs, non-prescription drugs, or dietary supplements you use. Also tell them if you smoke, drink alcohol, or use illegal drugs. Some items may interact with your medicine. What should I watch for while using this medicine? This drug may make you feel generally unwell. This is not uncommon, as chemotherapy can affect healthy cells as well as cancer cells. Report any side effects. Continue your course of treatment even though you feel ill unless your doctor tells you to stop. There is a maximum amount of this medicine you should receive throughout your life. The amount depends on  the medical condition being treated and your overall health. Your doctor will watch how much of this medicine you receive in your lifetime. Tell your doctor if you have taken this medicine  before. You may need blood work done while you are taking this medicine. Your urine may turn red for a few days after your dose. This is not blood. If your urine is dark or brown, call your doctor. In some cases, you may be given additional medicines to help with side effects. Follow all directions for their use. Call your doctor or health care professional for advice if you get a fever, chills or sore throat, or other symptoms of a cold or flu. Do not treat yourself. This drug decreases your body's ability to fight infections. Try to avoid being around people who are sick. This medicine may increase your risk to bruise or bleed. Call your doctor or health care professional if you notice any unusual bleeding. Talk to your doctor about your risk of cancer. You may be more at risk for certain types of cancers if you take this medicine. Do not become pregnant while taking this medicine or for 6 months after stopping it. Women should inform their doctor if they wish to become pregnant or think they might be pregnant. Men should not father a child while taking this medicine and for 6 months after stopping it. There is a potential for serious side effects to an unborn child. Talk to your health care professional or pharmacist for more information. Do not breast-feed an infant while taking this medicine. This medicine has caused ovarian failure in some women and reduced sperm counts in some men This medicine may interfere with the ability to have a child. Talk with your doctor or health care professional if you are concerned about your fertility. What side effects may I notice from receiving this medicine? Side effects that you should report to your doctor or health care professional as soon as possible: -allergic reactions like skin rash, itching or hives, swelling of the face, lips, or tongue -breathing problems -chest pain -fast or irregular heartbeat -low blood counts - this medicine may decrease the  number of white blood cells, red blood cells and platelets. You may be at increased risk for infections and bleeding. -pain, redness, or irritation at site where injected -signs of infection - fever or chills, cough, sore throat, pain or difficulty passing urine -signs of decreased platelets or bleeding - bruising, pinpoint red spots on the skin, black, tarry stools, blood in the urine -swelling of the ankles, feet, hands -tiredness -weakness Side effects that usually do not require medical attention (report to your doctor or health care professional if they continue or are bothersome): -diarrhea -hair loss -mouth sores -nail discoloration or damage -nausea -red colored urine -vomiting This list may not describe all possible side effects. Call your doctor for medical advice about side effects. You may report side effects to FDA at 1-800-FDA-1088. Where should I keep my medicine? This drug is given in a hospital or clinic and will not be stored at home. NOTE: This sheet is a summary. It may not cover all possible information. If you have questions about this medicine, talk to your doctor, pharmacist, or health care provider.  2018 Elsevier/Gold Standard (2015-07-24 11:28:51)  Cyclophosphamide injection What is this medicine? CYCLOPHOSPHAMIDE (sye kloe FOSS fa mide) is a chemotherapy drug. It slows the growth of cancer cells. This medicine is used to treat many types of cancer like lymphoma,  myeloma, leukemia, breast cancer, and ovarian cancer, to name a few. This medicine may be used for other purposes; ask your health care provider or pharmacist if you have questions. COMMON BRAND NAME(S): Cytoxan, Neosar What should I tell my health care provider before I take this medicine? They need to know if you have any of these conditions: -blood disorders -history of other chemotherapy -infection -kidney disease -liver disease -recent or ongoing radiation therapy -tumors in the bone  marrow -an unusual or allergic reaction to cyclophosphamide, other chemotherapy, other medicines, foods, dyes, or preservatives -pregnant or trying to get pregnant -breast-feeding How should I use this medicine? This drug is usually given as an injection into a vein or muscle or by infusion into a vein. It is administered in a hospital or clinic by a specially trained health care professional. Talk to your pediatrician regarding the use of this medicine in children. Special care may be needed. Overdosage: If you think you have taken too much of this medicine contact a poison control center or emergency room at once. NOTE: This medicine is only for you. Do not share this medicine with others. What if I miss a dose? It is important not to miss your dose. Call your doctor or health care professional if you are unable to keep an appointment. What may interact with this medicine? This medicine may interact with the following medications: -amiodarone -amphotericin B -azathioprine -certain antiviral medicines for HIV or AIDS such as protease inhibitors (e.g., indinavir, ritonavir) and zidovudine -certain blood pressure medications such as benazepril, captopril, enalapril, fosinopril, lisinopril, moexipril, monopril, perindopril, quinapril, ramipril, trandolapril -certain cancer medications such as anthracyclines (e.g., daunorubicin, doxorubicin), busulfan, cytarabine, paclitaxel, pentostatin, tamoxifen, trastuzumab -certain diuretics such as chlorothiazide, chlorthalidone, hydrochlorothiazide, indapamide, metolazone -certain medicines that treat or prevent blood clots like warfarin -certain muscle relaxants such as succinylcholine -cyclosporine -etanercept -indomethacin -medicines to increase blood counts like filgrastim, pegfilgrastim, sargramostim -medicines used as general anesthesia -metronidazole -natalizumab This list may not describe all possible interactions. Give your health care  provider a list of all the medicines, herbs, non-prescription drugs, or dietary supplements you use. Also tell them if you smoke, drink alcohol, or use illegal drugs. Some items may interact with your medicine. What should I watch for while using this medicine? Visit your doctor for checks on your progress. This drug may make you feel generally unwell. This is not uncommon, as chemotherapy can affect healthy cells as well as cancer cells. Report any side effects. Continue your course of treatment even though you feel ill unless your doctor tells you to stop. Drink water or other fluids as directed. Urinate often, even at night. In some cases, you may be given additional medicines to help with side effects. Follow all directions for their use. Call your doctor or health care professional for advice if you get a fever, chills or sore throat, or other symptoms of a cold or flu. Do not treat yourself. This drug decreases your body's ability to fight infections. Try to avoid being around people who are sick. This medicine may increase your risk to bruise or bleed. Call your doctor or health care professional if you notice any unusual bleeding. Be careful brushing and flossing your teeth or using a toothpick because you may get an infection or bleed more easily. If you have any dental work done, tell your dentist you are receiving this medicine. You may get drowsy or dizzy. Do not drive, use machinery, or do anything that needs mental alertness  until you know how this medicine affects you. Do not become pregnant while taking this medicine or for 1 year after stopping it. Women should inform their doctor if they wish to become pregnant or think they might be pregnant. Men should not father a child while taking this medicine and for 4 months after stopping it. There is a potential for serious side effects to an unborn child. Talk to your health care professional or pharmacist for more information. Do not  breast-feed an infant while taking this medicine. This medicine may interfere with the ability to have a child. This medicine has caused ovarian failure in some women. This medicine has caused reduced sperm counts in some men. You should talk with your doctor or health care professional if you are concerned about your fertility. If you are going to have surgery, tell your doctor or health care professional that you have taken this medicine. What side effects may I notice from receiving this medicine? Side effects that you should report to your doctor or health care professional as soon as possible: -allergic reactions like skin rash, itching or hives, swelling of the face, lips, or tongue -low blood counts - this medicine may decrease the number of white blood cells, red blood cells and platelets. You may be at increased risk for infections and bleeding. -signs of infection - fever or chills, cough, sore throat, pain or difficulty passing urine -signs of decreased platelets or bleeding - bruising, pinpoint red spots on the skin, black, tarry stools, blood in the urine -signs of decreased red blood cells - unusually weak or tired, fainting spells, lightheadedness -breathing problems -dark urine -dizziness -palpitations -swelling of the ankles, feet, hands -trouble passing urine or change in the amount of urine -weight gain -yellowing of the eyes or skin Side effects that usually do not require medical attention (report to your doctor or health care professional if they continue or are bothersome): -changes in nail or skin color -hair loss -missed menstrual periods -mouth sores -nausea, vomiting This list may not describe all possible side effects. Call your doctor for medical advice about side effects. You may report side effects to FDA at 1-800-FDA-1088. Where should I keep my medicine? This drug is given in a hospital or clinic and will not be stored at home. NOTE: This sheet is a  summary. It may not cover all possible information. If you have questions about this medicine, talk to your doctor, pharmacist, or health care provider.  2018 Elsevier/Gold Standard (2012-04-10 16:22:58)

## 2017-10-22 NOTE — Progress Notes (Signed)
Discharge instructions printed and verbally reviewed. Patient verbalized understanding.  

## 2017-10-22 NOTE — Telephone Encounter (Signed)
No LOS 5/15

## 2017-10-24 ENCOUNTER — Encounter: Payer: Self-pay | Admitting: *Deleted

## 2017-10-24 ENCOUNTER — Inpatient Hospital Stay: Payer: Medicaid Other

## 2017-10-24 DIAGNOSIS — Z5111 Encounter for antineoplastic chemotherapy: Secondary | ICD-10-CM | POA: Diagnosis not present

## 2017-10-24 DIAGNOSIS — C50811 Malignant neoplasm of overlapping sites of right female breast: Secondary | ICD-10-CM

## 2017-10-24 DIAGNOSIS — Z170 Estrogen receptor positive status [ER+]: Principal | ICD-10-CM

## 2017-10-24 MED ORDER — PEGFILGRASTIM-CBQV 6 MG/0.6ML ~~LOC~~ SOSY
PREFILLED_SYRINGE | SUBCUTANEOUS | Status: AC
Start: 2017-10-24 — End: ?
  Filled 2017-10-24: qty 0.6

## 2017-10-24 MED ORDER — PEGFILGRASTIM-CBQV 6 MG/0.6ML ~~LOC~~ SOSY
6.0000 mg | PREFILLED_SYRINGE | Freq: Once | SUBCUTANEOUS | Status: AC
  Administered 2017-10-24: 6 mg via SUBCUTANEOUS

## 2017-10-24 NOTE — Patient Instructions (Signed)
Pegfilgrastim injection What is this medicine? PEGFILGRASTIM (PEG fil gra stim) is a long-acting granulocyte colony-stimulating factor that stimulates the growth of neutrophils, a type of white blood cell important in the body's fight against infection. It is used to reduce the incidence of fever and infection in patients with certain types of cancer who are receiving chemotherapy that affects the bone marrow, and to increase survival after being exposed to high doses of radiation. This medicine may be used for other purposes; ask your health care provider or pharmacist if you have questions. COMMON BRAND NAME(S): Neulasta What should I tell my health care provider before I take this medicine? They need to know if you have any of these conditions: -kidney disease -latex allergy -ongoing radiation therapy -sickle cell disease -skin reactions to acrylic adhesives (On-Body Injector only) -an unusual or allergic reaction to pegfilgrastim, filgrastim, other medicines, foods, dyes, or preservatives -pregnant or trying to get pregnant -breast-feeding How should I use this medicine? This medicine is for injection under the skin. If you get this medicine at home, you will be taught how to prepare and give the pre-filled syringe or how to use the On-body Injector. Refer to the patient Instructions for Use for detailed instructions. Use exactly as directed. Tell your healthcare provider immediately if you suspect that the On-body Injector may not have performed as intended or if you suspect the use of the On-body Injector resulted in a missed or partial dose. It is important that you put your used needles and syringes in a special sharps container. Do not put them in a trash can. If you do not have a sharps container, call your pharmacist or healthcare provider to get one. Talk to your pediatrician regarding the use of this medicine in children. While this drug may be prescribed for selected conditions,  precautions do apply. Overdosage: If you think you have taken too much of this medicine contact a poison control center or emergency room at once. NOTE: This medicine is only for you. Do not share this medicine with others. What if I miss a dose? It is important not to miss your dose. Call your doctor or health care professional if you miss your dose. If you miss a dose due to an On-body Injector failure or leakage, a new dose should be administered as soon as possible using a single prefilled syringe for manual use. What may interact with this medicine? Interactions have not been studied. Give your health care provider a list of all the medicines, herbs, non-prescription drugs, or dietary supplements you use. Also tell them if you smoke, drink alcohol, or use illegal drugs. Some items may interact with your medicine. This list may not describe all possible interactions. Give your health care provider a list of all the medicines, herbs, non-prescription drugs, or dietary supplements you use. Also tell them if you smoke, drink alcohol, or use illegal drugs. Some items may interact with your medicine. What should I watch for while using this medicine? You may need blood work done while you are taking this medicine. If you are going to need a MRI, CT scan, or other procedure, tell your doctor that you are using this medicine (On-Body Injector only). What side effects may I notice from receiving this medicine? Side effects that you should report to your doctor or health care professional as soon as possible: -allergic reactions like skin rash, itching or hives, swelling of the face, lips, or tongue -dizziness -fever -pain, redness, or irritation at site   where injected -pinpoint red spots on the skin -red or dark-brown urine -shortness of breath or breathing problems -stomach or side pain, or pain at the shoulder -swelling -tiredness -trouble passing urine or change in the amount of urine Side  effects that usually do not require medical attention (report to your doctor or health care professional if they continue or are bothersome): -bone pain -muscle pain This list may not describe all possible side effects. Call your doctor for medical advice about side effects. You may report side effects to FDA at 1-800-FDA-1088. Where should I keep my medicine? Keep out of the reach of children. Store pre-filled syringes in a refrigerator between 2 and 8 degrees C (36 and 46 degrees F). Do not freeze. Keep in carton to protect from light. Throw away this medicine if it is left out of the refrigerator for more than 48 hours. Throw away any unused medicine after the expiration date. NOTE: This sheet is a summary. It may not cover all possible information. If you have questions about this medicine, talk to your doctor, pharmacist, or health care provider.  2018 Elsevier/Gold Standard (2016-05-23 12:58:03)  

## 2017-10-27 ENCOUNTER — Encounter (HOSPITAL_COMMUNITY): Payer: Self-pay

## 2017-10-27 ENCOUNTER — Other Ambulatory Visit (HOSPITAL_COMMUNITY): Payer: Self-pay

## 2017-10-28 ENCOUNTER — Telehealth: Payer: Self-pay | Admitting: *Deleted

## 2017-10-28 NOTE — Telephone Encounter (Signed)
-----   Message from Smith Mince, RN sent at 10/22/2017  2:44 PM EDT ----- Regarding: [facility]: First time Chemo Patient tolerated first time Adriamycin and Cytoxan well.

## 2017-10-28 NOTE — Telephone Encounter (Signed)
Called pt to discuss first chemo with A/C.  She reports that she is working & doing well.  She has had some nausea but no vomiting.  Nausea med helps.  She reports some fleeting pains occ in her nipple.  Informed that this is probably just nerve endings.  Reminded of upcoming f/u appt. & informed to call for any questions or concerns.

## 2017-11-05 ENCOUNTER — Encounter (HOSPITAL_COMMUNITY): Payer: Self-pay | Admitting: *Deleted

## 2017-11-05 NOTE — Progress Notes (Signed)
[place]  [phone] Fax:([phone]) [phone]  Clinic Follow Up Note   Patient Care Team: [name], MD as PCP - General (Family Medicine) [name], MD as Consulting Physician (General Surgery) [name], MD as Consulting Physician (Hematology)   Date of Service:  11/06/2017  CHIEF COMPLAINTS:  Follow up for Bilateral Breast Cancer   Oncology History   Cancer Staging Cancer of overlapping sites of right female breast [facility]) Staging form: Breast, AJCC 8th Edition - Clinical stage from 09/23/2017: Stage IIIA (cT3, cN1, cM0, G2, ER+, PR-, HER2-) - Signed by [name], MD on 10/09/2017 - Clinical: cT3 - Unsigned       Cancer of overlapping sites of right female breast ([place])   09/09/2017 Mammogram    IMPRESSION: 1. Highly suspicious large right breast mass involving the outer and upper breast extending from approximately 9 o'clock through 12-1 o'clock, maximum measurement approximating 7.5 cm. The mass involves the right nipple, accounting for nipple retraction. Architectural distortion, microcalcifications and diffuse trabecular thickening and skin thickening are associated with the large mass, raising the possibility of this representing an inflammatory cancer. 2. Satellite masses separate from the dominant mass involving the lower outer quadrant and the upper inner quadrant of the right breast, measured above. 3. Two adjacent pathologic right axillary lymph nodes. 4. Indeterminate 0.9 cm solid mass involving the upper outer quadrant of the left breast which accounts for a mammographic finding. 5. No pathologic left axillary lymphadenopathy.      09/09/2017 Breast US    Targeted right breast ultrasound is performed, showing a very large hypoechoic mass with irregular margins extending from the approximate 9 o'clock position through the 12 to 1 o'clock position. On the CC gait image, the mass measures maximally approximately 7.5 cm. The mass has  irregular margins, demonstrates acoustic shadowing, and demonstrates internal power Doppler flow. The mass extends into the retroareolar region and involves the nipple, accounting for the nipple retraction.  Separate from the dominant mass at the 7 o'clock position approximately 4 cm from nipple is a hypoechoic antiparallel mass with irregular margins measuring approximately 1.4 x 2.7 x 1.7 cm.  Separate from the dominant mass at the 1 o'clock position approximately 2 cm from nipple is a hypoechoic mass with irregular margins measuring approximately 1.7 x 0.5 x 1.6 cm. Both of these masses also demonstrate internal power Doppler flow.  Sonographic evaluation of the right axilla demonstrates 2 adjacent pathologic lymph nodes, the larger measuring approximately 2.5 x 2.5 x 2.8 cm, the smaller measuring approximately 2.0 x 0.8 x 2.7 cm.      09/23/2017 Initial Biopsy    Diagnosis 1. Breast, left, needle core biopsy, 2 o'clock - INVASIVE DUCTAL CARCINOMA. - SEE COMMENT. 2. Breast, right, needle core biopsy, 11:30 o'clock - INVASIVE MAMMARY CARCINOMA. - SEE COMMENT. 3. Lymph node, needle/core biopsy, right axilla - METASTATIC MAMMARY CARCINOMA IN 1 OF 1 LYMPH NODE (1/1). Microscopic Comment 1. There is a small focus of grade I invasive ductal carcinoma. A complete breast prognostic profile will be attempted and the results reported separately. The results were called to the [facility] on 09/24/2017. 2. The carcinoma appears grade II. An E-Cadherin stain and a breast prognostic profile will be performed on part 2 and the results reported separately. (JBK:kh 09-24-17) [name]  1. HER2 Negative by FISH Estrogen receptor: 100% positive, strong staining Progesterone receptor: 80% positive, strong staining Ki67: 5%  2. HER2 Negative by FISH Estrogen receptor: 100%  positive, strong staining Progesterone receptor: 0%, negative  Ki67: 30% 2. The tumor cells are strongly  positive for E-cadherin, supporting a ductal phenotype      09/23/2017 Cancer Staging    Staging form: Breast, AJCC 8th Edition - Clinical stage from 09/23/2017: Stage IIIA (cT3, cN1, cM0, G2, ER+, PR-, HER2-) - Signed by [name], MD on 10/09/2017      10/07/2017 Breast MRI    IMPRESSION: 1. Biopsy proven malignancy involving all 4 quadrants of the right breast, although predominantly located in the anterior third of the central upper right breast measures 6.8 x 9.6 x 7.2 cm.  2. Five pathologic right axillary lymph nodes, compatible with biopsy proven axillary metastases.  3. Small mass with associated biopsy related changes in the upper-outer left breast at site of known malignancy measures 0.9 x 0.8 x 0.7 cm. No abnormal lymph nodes seen in the left axilla.  RECOMMENDATION: Treatment plan for known bilateral breast malignancy.  BI-RADS CATEGORY  6: Known biopsy-proven malignancy.      10/08/2017 Initial Diagnosis    Cancer of overlapping sites of right female breast ([place])      10/13/2017 Pathology Results    Diagnosis 1. Breast, right, needle core biopsy, 1 o'clock - INVASIVE DUCTAL CARCINOMA. - SEE COMMENT. 2. Breast, right, needle core biopsy, 7 o'clock - INVASIVE DUCTAL CARCINOMA. - SEE COMMENT. Microscopic Comment 1. and 2. The carcinoma in parts 1 and 2 is morphologically similar and appears grade II. Because breast prognostic profiles were performed on the patients previous case, [id number], it will not be repeated on the current case unless requested.       10/17/2017 Imaging    CT CAP IMPRESSION: 1. Infiltrative right breast mass with overlying skin thickening is identified compatible with known breast cancer. 2. Enlarged right axillary lymph nodes compatible with metastatic adenopathy. 3. No additional sites of disease identified within the chest and no evidence for metastasis to the abdomen or pelvis. 4. Aortic atherosclerosis and 3 vessel coronary  artery atherosclerotic calcifications. Aortic Atherosclerosis (ICD10-I70.0).      10/17/2017 Imaging    Bone Scan FINDINGS: There are no foci of increased or decreased radiotracer uptake to suggest osseous metastatic disease. 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 There is degenerative type uptake within both shoulders and the left ankle.  Normal physiologic activity is identified within the kidneys and urinary bladder.  IMPRESSION: No evidence of osseous metastatic disease.      10/22/2017 -  Chemotherapy    AC q2 weeks x4 cycles starting 10/22/17, followed by weekly taxol x12       Malignant neoplasm of left breast in female, estrogen receptor positive ([place])   09/09/2017 Breast US    Targeted left breast ultrasound is performed, showing an oval parallel hypoechoic mass containing microcalcifications at the 2 o'clock position approximately 6 cm from nipple measuring approximately 0.9 x 0.5 x 0.9 cm, corresponding to the mammographic finding.  Sonographic evaluation of the left axilla demonstrates no pathologic lymphadenopathy.      09/23/2017 Initial Biopsy    Diagnosis 1. Breast, left, needle core biopsy, 2 o'clock - INVASIVE DUCTAL CARCINOMA. - SEE COMMENT. 2. Breast, right, needle core biopsy, 11:30 o'clock - INVASIVE MAMMARY CARCINOMA. - SEE COMMENT. 3. Lymph node, needle/core biopsy, right axilla - METASTATIC MAMMARY CARCINOMA IN 1 OF 1 LYMPH NODE (1/1). Microscopic Comment 1. There is a small focus of grade I  invasive ductal carcinoma. A complete breast prognostic profile will be attempted and the results reported separately. The results were called to the [facility] on 09/24/2017. 2. The carcinoma appears grade II. An E-Cadherin stain and a breast prognostic profile will be performed on part 2 and the results reported separately. (JBK:kh 09-24-17) JOSHUA  KISH  1. HER2 Negative by FISH Estrogen receptor: 100% positive, strong staining Progesterone receptor: 80% positive, strong staining Ki67: 5%  2. HER2 Negative by FISH Estrogen receptor: 100% positive, strong staining Progesterone receptor: 0%, negative  Ki67: 30%      10/08/2017 Initial Diagnosis    Malignant neoplasm of left breast in female, estrogen receptor positive ([place])       HISTORY OF PRESENTING ILLNESS:   Darlene Beasley 62 y.o. female is here because of newly diagnosed bilateral breast cancer. In 03/2017 she "pinched" herself at work and developed a right breast bruise and soreness which finally resolved in 04/2017. In January 2019 she noticed right nipple inversion. She researched online and made plans to have a mammogram, last done 30 years ago. She had a palpable breast 'lump" which she felt after her son was born 67 years ago that was never biopsied and eventually resolved. She reports intentional weight loss.   Imaging revealed a large right breast mass involving the upper outer quadrant extending from 9 o'clock to 12-1 o'clock, measuring approximately 7.5 cm, involving the right nipple. There is a satellite lesion at the 7 o'clock position approximately 4 cm from the nipple with irregular margins measuring 1.4 x 2.7, x 1.7 cm. Additional satellite lesion at 1 o'clock position approximately 2 cm from the nipple measures 1.7 x 0.5 x 1.6 cm. [place] of right axilla reveals 2 adjacent pathologic lymph nodes, measuring 2.5 x 2.5 x 2.8 cm and 2.0 x 0.8 x 2.7 cm. Biopsy of the right breast revealed invasive mammary carcinoma, tumor cells are strongly positive for E-cadherin, supporting a ductal phenotype; grade II, HER2 negative, ER positive, PR negative, metastatic to right axilla. Targeted left breast US reveals hypoechoic mass at the 2 o'clock position approximately 6 cm from the nipple measuring 0.9 x 0.5 x 0.9 fm. No suspicious adenopathy in the left axilla. Biopsy of the left breast  revealed invasive ductal carcinoma, grade I, HER2 negative, ER/PR positive. Breast MRI revealed biopsy-proven malignancy involving all 4 quadrants of the right breast, predominantly in the anterior third of the central upper right breast measuring 6.8 x 9.6 x 7.2 cm with 5 pathologic right axillary lymph nodes, compatible with biopsy-proven axillary metastases. In the left breast there is a small mass with associated biopsy related changes in the upper outer left breast at site of known malignancy, no abnormal axillary lymph nodes.   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 Had GERD in the past. She has smoked 1-2 PPD cigarettes x51 years, currently weaning. She quit drinking alcohol when she changed her diet and began losing weight in 08/2017. She has a significant other with whom she resides. She is independent of all ADLs. She is adopted, family history unknown.  Today she feels well, she has lingering cough from recent URI. No fever or chills. She is fatigued at baseline due to busy job, she works 14-hour days, 60 hours per week, working at [employer] in [place]. She tolerated breast biopsy well with mild bruising and swelling.   GYN HISTORY  Menarchal: age 39 LMP: age 80-50 Contraceptive: age [age] OCP HRT: None G1P1   CURRENT THERAPY : Neoadjuvant chemo AC q2 weeks x4 cycles starting 10/22/17, followed by weekly taxol x12  INTERVAL HISTORY  Darlene Beasley is here for a follow up and cycle 2 AC. She presents to the clinic today by herself. She notes she handled her first cycle chemo very well. She notes no issues. 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 She notes her back hips were in pain and shoulder pain only lasted 1 day from neulasta.    On review of symptoms, pt notes constipation. She uses miralax for this and helps this. She eats adequately. She tries  to have a bowel movement every 1-3 days. She notes poison ivy rash on her right anterior ankle. She notes she has trouble sleeping from long work hours.       MEDICAL HISTORY:  Past Medical History:  Diagnosis Date  . Arthritis   . breast ca dx'd 08/2017  . GERD (gastroesophageal reflux disease)     SURGICAL HISTORY: Past Surgical History:  Procedure Laterality Date  . IR FLUORO GUIDE PORT INSERTION LEFT  10/20/2017  . IR US GUIDE VASC ACCESS LEFT  10/20/2017  . TONSILLECTOMY Bilateral   . TUBAL LIGATION      SOCIAL HISTORY: Social History   Socioeconomic History  . Marital status: Significant Other    Spouse name: Not on file  . Number of children: 1  . Years of education: Not on file  . Highest education level: Not on file  Occupational History  . Not on file  Social Needs  . Financial resource strain: Not on file  . Food insecurity:    Worry: Not on file    Inability: Not on file  . Transportation needs:    Medical: Not on file    Non-medical: Not on file  Tobacco Use  . Smoking status: Current Every Day Smoker    Packs/day: 0.50    Years: 51.00    Pack years: 25.50    Types: Cigarettes  . Smokeless tobacco: Never Used  . Tobacco comment: Working on quitting.  Substance and Sexual Activity  . Alcohol use: No    Comment: stopped 08/2017   . Drug use: Yes    Types: Marijuana    Comment: daily, since age 77   . Sexual activity: Not Currently  Lifestyle  . Physical activity:    Days per week: 7 days    Minutes per session: 80 min  . Stress: Very much  Relationships  . Social connections:    Talks on phone: Never    Gets together: Never    Attends religious service: Never    Active member of club or organization: No    Attends meetings of clubs or organizations: Never    Relationship status: Living with partner  . Intimate partner violence:    Fear of current or ex partner: No    Emotionally abused: No    Physically abused: No    Forced sexual  activity: No  Other Topics Concern  . Not on file  Social History Narrative  . Not on file    FAMILY HISTORY: Family History  Adopted: Yes  Problem Relation Age of Onset  . Congestive Heart Failure Mother   . Congestive Heart Failure Father     ALLERGIES:  is allergic to bee venom.  MEDICATIONS:  Current Outpatient Medications  Medication Sig Dispense Refill  . lidocaine-prilocaine (EMLA) cream Apply to affected area once 30 g 3  . ondansetron (ZOFRAN) 8 MG tablet Take 1 tablet (8 mg total) by mouth 2 (two) times daily as needed. Start on the third day after chemotherapy. 30 tablet 1  . prochlorperazine (COMPAZINE) 10 MG tablet Take 1 tablet (10 mg total) by mouth every 6 (six) hours as needed (Nausea or vomiting). 30 tablet 3   No current facility-administered medications for this visit.     REVIEW OF SYSTEMS:   Constitutional: Denies fevers, chills or abnormal night sweats (+) trouble sleep Eyes: Denies blurriness of vision, double vision or watery eyes Ears, nose, mouth, throat, and face: Denies mucositis or sore throat Respiratory: Denies cough, dyspnea or wheezes Cardiovascular: Denies palpitation, chest discomfort or lower extremity swelling Gastrointestinal:  Denies nausea, heartburn (+) constipation Skin: Denies abnormal skin rashes (+) poison ivy rash on anterior right ankle Lymphatics: Denies new lymphadenopathy or easy bruising Neurological:Denies numbness, tingling or new weaknesses Behavioral/Psych: Mood is stable, no new changes  All other systems were reviewed with the patient and are negative.  PHYSICAL EXAMINATION: ECOG PERFORMANCE STATUS: 0 - Asymptomatic  Vitals:   11/06/17 1056  BP: (!) 157/85  Pulse: 76  Resp: 18  Temp: 98 F (36.7 C)  SpO2: 98%   Filed Weights   11/06/17 1056  Weight: 173 lb (78.5 kg)    GENERAL:alert, no distress and comfortable SKIN: skin color, texture, turgor are normal, no rashes or significant lesions EYES:  normal, conjunctiva are pink and non-injected, sclera clear OROPHARYNX:no exudate, no erythema and lips, buccal mucosa, and tongue normal  NECK: supple, thyroid normal size, non-tender, without nodularity LYMPH:  no palpable lymphadenopathy in the cervical, axillary or inguinal LUNGS: clear to auscultation and percussion with normal breathing effort HEART: regular rate & rhythm and no murmurs and no lower extremity edema ABDOMEN:abdomen soft, non-tender and normal bowel sounds Musculoskeletal:no cyanosis of digits and no clubbing  PSYCH: alert & oriented x 3 with fluent speech NEURO: no focal motor/sensory deficits BREAST: Palpable Left and right axillary lymph nodes are softer. No palpable mass in left breast. Skin hyperpigmentation of right nipple, 7x8 cm mass in center of right breast, now softer.   LABORATORY DATA:  I have reviewed the data as listed CBC Latest Ref Rng & Units 11/06/2017 10/22/2017 10/20/2017  WBC 3.9 - 10.3 K/uL 11.5(H) 6.2 6.0  Hemoglobin 11.6 - 15.9 g/dL 12.2 14.1 14.4  Hematocrit 34.8 - 46.6 % 35.9 42.0 41.5  Platelets 145 - 400 K/uL 253 198 216    CMP Latest Ref Rng & Units 11/06/2017 10/22/2017 10/17/2017  Glucose 70 - 140 mg/dL 95 92 95  BUN 7 - 26 mg/dL '23 20 24  '$ Creatinine 0.60 - 1.10 mg/dL 0.84 0.82 0.85  Sodium 136 - 145 mmol/L 142 141 142  Potassium 3.5 - 5.1 mmol/L 4.1 4.0 4.6  Chloride 98 - 109 mmol/L 108 108 108  CO2 22 - 29 mmol/L '26 27 27  '$ Calcium 8.4 - 10.4 mg/dL 8.7 9.2 9.6  Total Protein 6.4 - 8.3 g/dL 5.9(L) 6.2(L) 6.5  Total Bilirubin 0.2 - 1.2 mg/dL <0.2(L) 0.5 0.6  Alkaline Phos 40 - 150 U/L 84 62 62  AST 5 - 34 U/L '11 14 17  '$ ALT 0 - 55 U/L '12 16 21     '$ PATHOLOGY   Diagnosis 10/13/17 1. Breast, right, needle core biopsy, 1 o'clock - INVASIVE DUCTAL CARCINOMA. - SEE COMMENT. 2. Breast, right,  needle core biopsy, 7 o'clock - INVASIVE DUCTAL CARCINOMA. - SEE COMMENT. Microscopic Comment 1. and 2. The carcinoma in parts 1 and 2 is  morphologically similar and appears grade II. Because breast prognostic profiles were performed on the patients previous case, [id number], it will not be repeated on the current case unless requested.     Diagnosis 09/23/17 1. Breast, left, needle core biopsy, 2 o'clock - INVASIVE DUCTAL CARCINOMA. - SEE COMMENT. 2. Breast, right, needle core biopsy, 11:30 o'clock - INVASIVE MAMMARY CARCINOMA. - SEE COMMENT. 3. Lymph node, needle/core biopsy, right axilla - METASTATIC MAMMARY CARCINOMA IN 1 OF 1 LYMPH NODE (1/1). Microscopic Comment 1. There is a small focus of grade I invasive ductal carcinoma. A complete breast prognostic profile will be attempted and the results reported separately. The results were called to the [facility] on 09/24/2017. 2. The carcinoma appears grade II. An E-Cadherin stain and a breast prognostic profile will be performed on part 2 and the results reported separately. (JBK:kh 09-24-17) [name] 1. HER2 Negative by FISH Estrogen receptor: 100% positive, strong staining Progesterone receptor: 80% positive, strong staining Ki67: 5% 2. HER2 Negative by FISH Estrogen receptor: 100% positive, strong staining Progesterone receptor: 0%, negative  Ki67: 30% 2. The tumor cells are strongly positive for E-cadherin, supporting a ductal phenotype     RADIOGRAPHIC STUDIES: I have personally reviewed the radiological images as listed and agreed with the findings in the report. Ct Chest W Contrast  Result Date: 10/19/2017 CLINICAL DATA:  Initial staging for right breast cancer. EXAM: CT CHEST, ABDOMEN, AND PELVIS WITH CONTRAST TECHNIQUE: Multidetector CT imaging of the chest, abdomen and pelvis was performed following the standard protocol during bolus administration of intravenous contrast. CONTRAST:  158m OMNIPAQUE IOHEXOL 300 MG/ML  SOLN COMPARISON:  None FINDINGS: CT CHEST FINDINGS Cardiovascular: Heart size is normal. Aortic atherosclerosis noted.  Calcification in the RCA, LAD and left circumflex coronary artery noted. Mediastinum/Nodes: Normal appearance of the thyroid gland. The trachea appears patent and is midline. Normal appearance of the esophagus. No enlarged mediastinal or hilar lymph nodes. Lungs/Pleura: No pleural effusion. Calcified granuloma identified in the right middle lobe. Scarring noted within the lingula. No suspicious pulmonary nodules. Musculoskeletal: No suspicious bone lesions. There is diffuse skin thickening overlying the right breast. Infiltrative tumor throughout the right breast is again noted, image 25/2. Enlarged right axillary lymph nodes are identified. The largest has a short axis of 3.1 cm, image 16/2. No supraclavicular or retropectoral adenopathy identified. CT ABDOMEN PELVIS FINDINGS Hepatobiliary: Left lobe of liver hypodensity measures 11 mm and is favored to represent a simple cyst. No suspicious liver lesions identified. Gallbladder normal. No biliary dilatation. Pancreas: Normal appearance of the pancreas. Spleen: Normal in size without focal abnormality. Adrenals/Urinary Tract: The adrenal glands are normal. No kidney mass or hydronephrosis. Urinary bladder appears normal. Stomach/Bowel: Stomach is unremarkable. No pathologic dilatation of the small bowel loops. Normal appearance of the colon. Vascular/Lymphatic: Aortic atherosclerosis. No aneurysm. No abdominal or pelvic adenopathy. Reproductive: Uterus and adnexal structures are unremarkable. Other: No ascites or focal fluid collections. Musculoskeletal: Benign appearing chondroid lesion within the proximal left femur is identified measuring 2.7 cm, image 82/5. Favor enchondroma or bone infarct. 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 No aggressive bone lesions noted. IMPRESSION: 1. Infiltrative right breast mass with overlying skin thickening is identified compatible with known breast cancer. 2. Enlarged right axillary lymph nodes compatible  with metastatic adenopathy. 3. No additional sites of disease identified  within the chest and no evidence for metastasis to the abdomen or pelvis. 4. Aortic atherosclerosis and 3 vessel coronary artery atherosclerotic calcifications. Aortic Atherosclerosis (ICD10-I70.0). Electronically Signed   By: Kerby Moors M.D.   On: 10/19/2017 22:50   Nm Bone Scan Whole Body  Result Date: 10/17/2017 CLINICAL DATA:  Breast cancer. Evaluate for osseous metastatic disease. EXAM: NUCLEAR MEDICINE WHOLE BODY BONE SCAN TECHNIQUE: Whole body anterior and posterior images were obtained approximately 3 hours after intravenous injection of radiopharmaceutical. RADIOPHARMACEUTICALS:  21.0 mCi Technetium-45mMDP IV COMPARISON:  CT chest, abdomen, and pelvis from same day. FINDINGS: There are no foci of increased or decreased radiotracer uptake to suggest osseous metastatic disease. 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 There is degenerative type uptake within both shoulders and the left ankle. Normal physiologic activity is identified within the kidneys and urinary bladder. IMPRESSION: No evidence of osseous metastatic disease. Electronically Signed   By: WTitus DubinM.D.   On: 10/17/2017 16:11   Ct Abdomen Pelvis W Contrast  Result Date: 10/19/2017 CLINICAL DATA:  Initial staging for right breast cancer. EXAM: CT CHEST, ABDOMEN, AND PELVIS WITH CONTRAST TECHNIQUE: Multidetector CT imaging of the chest, abdomen and pelvis was performed following the standard protocol during bolus administration of intravenous contrast. CONTRAST:  109mOMNIPAQUE IOHEXOL 300 MG/ML  SOLN COMPARISON:  None FINDINGS: CT CHEST FINDINGS Cardiovascular: Heart size is normal. Aortic atherosclerosis noted. Calcification in the RCA, LAD and left circumflex coronary artery noted. Mediastinum/Nodes: Normal appearance of the thyroid gland. The trachea  appears patent and is midline. Normal appearance of the esophagus. No enlarged mediastinal or hilar lymph nodes. Lungs/Pleura: No pleural effusion. Calcified granuloma identified in the right middle lobe. Scarring noted within the lingula. No suspicious pulmonary nodules. Musculoskeletal: No suspicious bone lesions. There is diffuse skin thickening overlying the right breast. Infiltrative tumor throughout the right breast is again noted, image 25/2. Enlarged right axillary lymph nodes are identified. The largest has a short axis of 3.1 cm, image 16/2. No supraclavicular or retropectoral adenopathy identified. CT ABDOMEN PELVIS FINDINGS Hepatobiliary: Left lobe of liver hypodensity measures 11 mm and is favored to represent a simple cyst. No suspicious liver lesions identified. Gallbladder normal. No biliary dilatation. Pancreas: Normal appearance of the pancreas. Spleen: Normal in size without focal abnormality. Adrenals/Urinary Tract: The adrenal glands are normal. No kidney mass or hydronephrosis. Urinary bladder appears normal. Stomach/Bowel: Stomach is unremarkable. No pathologic dilatation of the small bowel loops. Normal appearance of the colon. Vascular/Lymphatic: Aortic atherosclerosis. No aneurysm. No abdominal or pelvic adenopathy. Reproductive: Uterus and adnexal structures are unremarkable. Other: No ascites or focal fluid collections. Musculoskeletal: Benign appearing chondroid lesion within the proximal left femur is identified measuring 2.7 cm, image 82/5. Favor enchondroma or bone infarct. 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 No aggressive bone lesions noted. IMPRESSION: 1. Infiltrative right breast mass with overlying skin thickening is identified compatible with known breast cancer. 2. Enlarged right axillary lymph nodes compatible with metastatic adenopathy. 3. No additional sites of disease identified within the chest and no evidence for metastasis to the abdomen or  pelvis. 4. Aortic atherosclerosis and 3 vessel coronary artery atherosclerotic calcifications. Aortic Atherosclerosis (ICD10-I70.0). Electronically Signed   By: TaKerby Moors.D.   On: 10/19/2017 22:50   Ir UsKoreauide Vasc Access Left  Result Date: 10/20/2017 CLINICAL DATA:  Breast cancer EXAM: LEFT INTERNAL JUGULAR SINGLE LUMEN POWER  PORT CATHETER INSERTION Date:  10/20/2017 10/20/2017 12:15 pm Radiologist:  M. Daryll Brod, MD Guidance:  Ultrasound and fluoroscopic MEDICATIONS: Ancef 2 g; The antibiotic was administered within an appropriate time interval prior to skin puncture. ANESTHESIA/SEDATION: Versed 3.0 mg IV; Fentanyl 100 mcg IV; Moderate Sedation Time:  30 minutes The patient was continuously monitored during the procedure by the interventional radiology nurse under my direct supervision. FLUOROSCOPY TIME:  18 seconds (2 mGy) COMPLICATIONS: None immediate. CONTRAST:  None. PROCEDURE: Informed consent was obtained from the patient following explanation of the procedure, risks, benefits and alternatives. The patient understands, agrees and consents for the procedure. All questions were addressed. A time out was performed. Maximal barrier sterile technique utilized including caps, mask, sterile gowns, sterile gloves, large sterile drape, hand hygiene, and 2% chlorhexidine scrub. Under sterile conditions and local anesthesia, left internal jugular micropuncture venous access was performed. Access was performed with ultrasound. Images were obtained for documentation. A guide wire was inserted followed by a transitional dilator. This allowed insertion of a guide wire and catheter into the IVC. Measurements were obtained from the SVC / RA junction back to the left IJ venotomy site. In the left infraclavicular chest, a subcutaneous pocket was created over the second anterior rib. This was done under sterile conditions and local anesthesia. 1% lidocaine with epinephrine was utilized for this. A 2.5 cm incision  was made in the skin. Blunt dissection was performed to create a subcutaneous pocket over the right pectoralis major muscle. The pocket was flushed with saline vigorously. There was adequate hemostasis. The port catheter was assembled and checked for leakage. The port catheter was secured in the pocket with two retention sutures. The tubing was tunneled subcutaneously to the left venotomy site and inserted into the SVC/RA junction through a valved peel-away sheath. Position was confirmed with fluoroscopy. Images were obtained for documentation. The patient tolerated the procedure well. No immediate complications. Incisions were closed in a two layer fashion with 4 - 0 Vicryl suture. Dermabond was applied to the skin. The port catheter was accessed, blood was aspirated followed by saline and heparin flushes. Needle was removed. A dry sterile dressing was applied. IMPRESSION: Ultrasound and fluoroscopically guided left internal jugular single lumen power port catheter insertion. Tip in the SVC/RA junction. Catheter ready for use. Electronically Signed   By: Jerilynn Mages.  Shick M.D.   On: 10/20/2017 12:28   Mm Clip Placement Right  Result Date: 10/13/2017 CLINICAL DATA:  Patient has biopsy proven right breast cancer metastatic axillary adenopathy. Ultrasound-guided core biopsies of additional lesions requested. EXAM: DIAGNOSTIC RIGHT MAMMOGRAM POST ULTRASOUND BIOPSIES COMPARISON:  Previous exam(s). FINDINGS: Mammographic images were obtained following ultrasound guided biopsy of the 1 o'clock and 7 o'clock region of the right breast. Mammographic images show there is a coil shaped clip in the upper-inner quadrant of the right breast and a heart shaped clip in the lower outer quadrant of the right breast. There is a ribbon shaped clip in the upper-outer quadrant of the right breast from a prior biopsy. The heart shaped clip is located 3.7 cm inferior to the ribbon shaped clip and the coil shaped clip is located 2.6 cm medial  to the ribbon shaped clip. IMPRESSION: Status post ultrasound-guided core biopsies of the right breast with pathology pending. Final Assessment: Post Procedure Mammograms for Marker Placement Electronically Signed   By: Lillia Mountain M.D.   On: 10/13/2017 16:56   [place] Rt Breast Bx W Loc Dev 1st Lesion Img Bx Spec US  Guide  Addendum Date: 10/15/2017   ADDENDUM REPORT: 10/14/2017 13:52 ADDENDUM: Pathology revealed GRADE II INVASIVE DUCTAL CARCINOMA of the Right breast, both locations, 1 o'clock and 7 o'clock. This was found to be concordant by Dr. Lillia Mountain. Pathology results were discussed with the patient by telephone. The patient reported doing well after the biopsies with tenderness at the sites. Post biopsy instructions and care were reviewed and questions were answered. The patient was encouraged to call [facility] for any additional concerns. The patient has a recent diagnosis of bilateral breast cancer and should follow her outlined treatment plan. Dr. [name] was notified of results via EPIC message on Oct 14, 2017. Pathology results reported by Terie Purser, RN on 10/14/2017. Electronically Signed   By: Lillia Mountain M.D.   On: 10/14/2017 13:52   Result Date: 10/15/2017 CLINICAL DATA:  Biopsy proven bilateral invasive mammary carcinoma. Status post ultrasound-guided core biopsy of a mass in the 11:30 region of the right breast which revealed grade 2 invasive mammary carcinoma and ultrasound-guided core biopsy of a right axillary lymph node showing metastatic mammary carcinoma. Ultrasound-guided core biopsies of the 1 o'clock and 7 o'clock region of the right breast requested. EXAM: ULTRASOUND GUIDED RIGHT BREAST CORE NEEDLE BIOPSY COMPARISON:  Previous exam(s). FINDINGS: I met with the patient and we discussed the procedure of ultrasound-guided biopsy, including benefits and alternatives. We discussed the high likelihood of a successful procedure. We discussed the risks of the  procedure, including infection, bleeding, tissue injury, clip migration, and inadequate sampling. Informed written consent was given. The usual time-out protocol was performed immediately prior to the procedure. Lesion quadrant: Upper inner quadrant Using sterile technique and 1% Lidocaine as local anesthetic, under direct ultrasound visualization, a 12 gauge spring-loaded device was used to perform biopsy of a mass in the 1 o'clock region of the right breast using a medial to lateral approach. At the conclusion of the procedure a coil shaped tissue marker clip was deployed into the biopsy cavity. Follow up 2 view mammogram was performed and dictated separately. I met with the patient and we discussed the procedure of ultrasound-guided biopsy, including benefits and alternatives. We discussed the high likelihood of a successful procedure. We discussed the risks of the procedure, including infection, bleeding, tissue injury, clip migration, and inadequate sampling. Informed written consent was given. The usual time-out protocol was performed immediately prior to the procedure. Lesion quadrant: Lower outer quadrant Using sterile technique and 1% Lidocaine as local anesthetic, under direct ultrasound visualization, a 12 gauge spring-loaded device was used to perform biopsy of the 7 o'clock region of the right breast using a lateral to medial approach. At the conclusion of the procedure a heart shaped tissue marker clip was deployed into the biopsy cavity. Follow up 2 view mammogram was performed and dictated separately. IMPRESSION: Ultrasound guided biopsies of the right breast. No apparent complications. Electronically Signed: By: Lillia Mountain M.D. On: 10/13/2017 16:52   [place] Rt Breast Bx W Loc Dev Ea Add Lesion Img Bx Spec US Guide  Addendum Date: 10/15/2017   ADDENDUM REPORT: 10/14/2017 13:52 ADDENDUM: Pathology revealed GRADE II INVASIVE DUCTAL CARCINOMA of the Right breast, both locations, 1 o'clock and 7 o'clock.  This was found to be concordant by Dr. Lillia Mountain. Pathology results were discussed with the patient by telephone. The patient reported doing well after the biopsies with tenderness at the sites. Post biopsy instructions and care were reviewed and questions were answered. The patient  was encouraged to call [facility] for any additional concerns. The patient has a recent diagnosis of bilateral breast cancer and should follow her outlined treatment plan. Dr. [name] was notified of results via EPIC message on Oct 14, 2017. Pathology results reported by Terie Purser, RN on 10/14/2017. Electronically Signed   By: Lillia Mountain M.D.   On: 10/14/2017 13:52   Result Date: 10/15/2017 CLINICAL DATA:  Biopsy proven bilateral invasive mammary carcinoma. Status post ultrasound-guided core biopsy of a mass in the 11:30 region of the right breast which revealed grade 2 invasive mammary carcinoma and ultrasound-guided core biopsy of a right axillary lymph node showing metastatic mammary carcinoma. Ultrasound-guided core biopsies of the 1 o'clock and 7 o'clock region of the right breast requested. EXAM: ULTRASOUND GUIDED RIGHT BREAST CORE NEEDLE BIOPSY COMPARISON:  Previous exam(s). FINDINGS: I met with the patient and we discussed the procedure of ultrasound-guided biopsy, including benefits and alternatives. We discussed the high likelihood of a successful procedure. We discussed the risks of the procedure, including infection, bleeding, tissue injury, clip migration, and inadequate sampling. Informed written consent was given. The usual time-out protocol was performed immediately prior to the procedure. Lesion quadrant: Upper inner quadrant Using sterile technique and 1% Lidocaine as local anesthetic, under direct ultrasound visualization, a 12 gauge spring-loaded device was used to perform biopsy of a mass in the 1 o'clock region of the right breast using a medial to lateral approach. At the  conclusion of the procedure a coil shaped tissue marker clip was deployed into the biopsy cavity. Follow up 2 view mammogram was performed and dictated separately. I met with the patient and we discussed the procedure of ultrasound-guided biopsy, including benefits and alternatives. We discussed the high likelihood of a successful procedure. We discussed the risks of the procedure, including infection, bleeding, tissue injury, clip migration, and inadequate sampling. Informed written consent was given. The usual time-out protocol was performed immediately prior to the procedure. Lesion quadrant: Lower outer quadrant Using sterile technique and 1% Lidocaine as local anesthetic, under direct ultrasound visualization, a 12 gauge spring-loaded device was used to perform biopsy of the 7 o'clock region of the right breast using a lateral to medial approach. At the conclusion of the procedure a heart shaped tissue marker clip was deployed into the biopsy cavity. Follow up 2 view mammogram was performed and dictated separately. IMPRESSION: Ultrasound guided biopsies of the right breast. No apparent complications. Electronically Signed: By: Lillia Mountain M.D. On: 10/13/2017 16:52   Ir Fluoro Guide Port Insertion Left  Result Date: 10/20/2017 CLINICAL DATA:  Breast cancer EXAM: LEFT INTERNAL JUGULAR SINGLE LUMEN POWER PORT CATHETER INSERTION Date:  10/20/2017 10/20/2017 12:15 pm Radiologist:  Jerilynn Mages. Daryll Brod, MD Guidance:  Ultrasound and fluoroscopic MEDICATIONS: Ancef 2 g; The antibiotic was administered within an appropriate time interval prior to skin puncture. ANESTHESIA/SEDATION: Versed 3.0 mg IV; Fentanyl 100 mcg IV; Moderate Sedation Time:  30 minutes The patient was continuously monitored during the procedure by the interventional radiology nurse under my direct supervision. FLUOROSCOPY TIME:  18 seconds (2 mGy) COMPLICATIONS: None immediate. CONTRAST:  None. PROCEDURE: Informed consent was obtained from the patient  following explanation of the procedure, risks, benefits and alternatives. The patient understands, agrees and consents for the procedure. All questions were addressed. A time out was performed. Maximal barrier sterile technique utilized including caps, mask, sterile gowns, sterile gloves, large sterile drape, hand hygiene, and 2% chlorhexidine scrub. Under sterile conditions and  local anesthesia, left internal jugular micropuncture venous access was performed. Access was performed with ultrasound. Images were obtained for documentation. A guide wire was inserted followed by a transitional dilator. This allowed insertion of a guide wire and catheter into the IVC. Measurements were obtained from the SVC / RA junction back to the left IJ venotomy site. In the left infraclavicular chest, a subcutaneous pocket was created over the second anterior rib. This was done under sterile conditions and local anesthesia. 1% lidocaine with epinephrine was utilized for this. A 2.5 cm incision was made in the skin. Blunt dissection was performed to create a subcutaneous pocket over the right pectoralis major muscle. The pocket was flushed with saline vigorously. There was adequate hemostasis. The port catheter was assembled and checked for leakage. The port catheter was secured in the pocket with two retention sutures. The tubing was tunneled subcutaneously to the left venotomy site and inserted into the SVC/RA junction through a valved peel-away sheath. Position was confirmed with fluoroscopy. Images were obtained for documentation. The patient tolerated the procedure well. No immediate complications. Incisions were closed in a two layer fashion with 4 - 0 Vicryl suture. Dermabond was applied to the skin. The port catheter was accessed, blood was aspirated followed by saline and heparin flushes. Needle was removed. A dry sterile dressing was applied. IMPRESSION: Ultrasound and fluoroscopically guided left internal jugular single  lumen power port catheter insertion. Tip in the SVC/RA junction. Catheter ready for use. Electronically Signed   By: Jerilynn Mages.  Shick M.D.   On: 10/20/2017 12:28    ASSESSMENT & PLAN:  Darlene Beasley is a 62 y.o. Caucasian female with history of substance use presented with right nipple inversion for 3 months.    1. 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 She has locally advanced right breast cancer with a dominant 9.6 cm mass involving all 4 quadrants of the breast involving 5 pathologic axillary lymph nodes. She has a small left side breast cancer as well. She saw Dr. Dalbert Batman who discussed surgical options, including right mastectomy and possible left breast conserving surgery with subsequent radiation. She also met with Dr. Lisbeth Renshaw. I discussed the advantages of neoadjuvant chemotherapy and recommends AC x4 followed by weekly taxol x12 cycles. She is active and relatively healthy, she appears to be a good candidate for chemotherapy  -Her baseline echo from 10/15/17 shows grade 1 diastolic dysfunction, EF 01-74%, GLS -21% -She started [facility] every 2 weeks on 10/22/17. She tolerated her first cycle very well with mild bone pain from neulasta.  -I discussed the more she receives chemo she may develop more side effects. She is to contact us if she develops anything unexpected.  -I suggest her to continue Miralax for her constipation.  -Her BP has been elevated during her visits. She has not checked at home. I advise she reduce salt intake, increase water intake and if BP high in future I will start her on hypertension  medication.  -Labs reviewed, WBC and ANC slightly elevated from neulasta otherwise labs adequate to proceed with [facility] today  -F/u in 2 weeks.    2. Social support -  She works 60 hours per week at her physically demanding job. I informed her she may need to take time off during her intensive chemotherapy schedule. She plans to apply for medicaid. I referred her to social work today for ongoing f/u.  -She has met with our financial office, she is working on Engineer, mining.   3. Substance use -She has been smoking cigarettes and marijuana for decades. I encouraged her to quit completely. She has been weaning herself off cigarettes; she will continue to try.  -She mentioned interest in herbal and holistic diet, I recommend she avoid herbal supplements while on chemotherapy and endocrine therapy due to potential drug interactions, she understands.  -She now smokes 3 cigarettes a day. I encouraged her to quit completely   4. Insomnia  -She works long hours and has interrupted sleep.  -I recommend benadryl and or melatonin OTC first. She agreed.    PLAN:  -Labs reviewed and adequate to proceed with cycle 2 AC with neulasta  -Lab, flush, f/u and AC in 2 weeks   All questions were answered. The patient knows to call the clinic with any problems, questions or concerns. I spent 20 minutes counseling the patient face to face. The total time spent in the appointment was 25 minutes and more than 50% was on counseling.     [name], MD 11/06/2017 5:37 PM  I, Joslyn Devon, am acting as scribe for [name], MD.   I have reviewed the above documentation for accuracy and completeness, and I agree with the above.

## 2017-11-06 ENCOUNTER — Other Ambulatory Visit: Payer: Self-pay | Admitting: Hematology

## 2017-11-06 ENCOUNTER — Inpatient Hospital Stay: Payer: Medicaid Other

## 2017-11-06 ENCOUNTER — Encounter: Payer: Self-pay | Admitting: Hematology

## 2017-11-06 ENCOUNTER — Telehealth: Payer: Self-pay | Admitting: Hematology

## 2017-11-06 ENCOUNTER — Inpatient Hospital Stay (HOSPITAL_BASED_OUTPATIENT_CLINIC_OR_DEPARTMENT_OTHER): Payer: Medicaid Other | Admitting: Hematology

## 2017-11-06 VITALS — BP 157/85 | HR 76 | Temp 98.0°F | Resp 18 | Ht 64.0 in | Wt 173.0 lb

## 2017-11-06 DIAGNOSIS — C50811 Malignant neoplasm of overlapping sites of right female breast: Secondary | ICD-10-CM

## 2017-11-06 DIAGNOSIS — Z170 Estrogen receptor positive status [ER+]: Secondary | ICD-10-CM | POA: Diagnosis not present

## 2017-11-06 DIAGNOSIS — C50412 Malignant neoplasm of upper-outer quadrant of left female breast: Secondary | ICD-10-CM

## 2017-11-06 DIAGNOSIS — Z5111 Encounter for antineoplastic chemotherapy: Secondary | ICD-10-CM | POA: Diagnosis not present

## 2017-11-06 DIAGNOSIS — Z7689 Persons encountering health services in other specified circumstances: Secondary | ICD-10-CM

## 2017-11-06 DIAGNOSIS — Z95828 Presence of other vascular implants and grafts: Secondary | ICD-10-CM

## 2017-11-06 LAB — CMP (CANCER CENTER ONLY)
ALK PHOS: 84 U/L (ref 40–150)
ALT: 12 U/L (ref 0–55)
ANION GAP: 8 (ref 3–11)
AST: 11 U/L (ref 5–34)
Albumin: 3.5 g/dL (ref 3.5–5.0)
BUN: 23 mg/dL (ref 7–26)
CALCIUM: 8.7 mg/dL (ref 8.4–10.4)
CO2: 26 mmol/L (ref 22–29)
Chloride: 108 mmol/L (ref 98–109)
Creatinine: 0.84 mg/dL (ref 0.60–1.10)
Glucose, Bld: 95 mg/dL (ref 70–140)
Potassium: 4.1 mmol/L (ref 3.5–5.1)
Sodium: 142 mmol/L (ref 136–145)
TOTAL PROTEIN: 5.9 g/dL — AB (ref 6.4–8.3)

## 2017-11-06 LAB — CBC WITH DIFFERENTIAL (CANCER CENTER ONLY)
BASOS PCT: 1 %
Basophils Absolute: 0.1 10*3/uL (ref 0.0–0.1)
Eosinophils Absolute: 0.1 10*3/uL (ref 0.0–0.5)
Eosinophils Relative: 1 %
HEMATOCRIT: 35.9 % (ref 34.8–46.6)
HEMOGLOBIN: 12.2 g/dL (ref 11.6–15.9)
Lymphocytes Relative: 22 %
Lymphs Abs: 2.6 10*3/uL (ref 0.9–3.3)
MCH: 31 pg (ref 25.1–34.0)
MCHC: 33.9 g/dL (ref 31.5–36.0)
MCV: 91.4 fL (ref 79.5–101.0)
MONO ABS: 0.7 10*3/uL (ref 0.1–0.9)
MONOS PCT: 6 %
NEUTROS ABS: 8.1 10*3/uL — AB (ref 1.5–6.5)
NEUTROS PCT: 70 %
Platelet Count: 253 10*3/uL (ref 145–400)
RBC: 3.93 MIL/uL (ref 3.70–5.45)
RDW: 13.9 % (ref 11.2–14.5)
WBC Count: 11.5 10*3/uL — ABNORMAL HIGH (ref 3.9–10.3)

## 2017-11-06 MED ORDER — SODIUM CHLORIDE 0.9% FLUSH
10.0000 mL | Freq: Once | INTRAVENOUS | Status: AC
  Administered 2017-11-06: 10 mL
  Filled 2017-11-06: qty 10

## 2017-11-06 MED ORDER — PROCHLORPERAZINE MALEATE 10 MG PO TABS: 10.0000 mg | ORAL_TABLET | Freq: Four times a day (QID) | ORAL | 3 refills | Status: DC | PRN

## 2017-11-06 MED ORDER — PALONOSETRON HCL INJECTION 0.25 MG/5ML
0.2500 mg | Freq: Once | INTRAVENOUS | Status: AC
  Administered 2017-11-06: 0.25 mg via INTRAVENOUS

## 2017-11-06 MED ORDER — SODIUM CHLORIDE 0.9% FLUSH
10.0000 mL | INTRAVENOUS | Status: DC | PRN
  Administered 2017-11-06: 10 mL
  Filled 2017-11-06: qty 10

## 2017-11-06 MED ORDER — HEPARIN SOD (PORK) LOCK FLUSH 100 UNIT/ML IV SOLN
500.0000 [IU] | Freq: Once | INTRAVENOUS | Status: AC | PRN
  Administered 2017-11-06: 500 [IU]
  Filled 2017-11-06: qty 5

## 2017-11-06 MED ORDER — SODIUM CHLORIDE 0.9 % IV SOLN
Freq: Once | INTRAVENOUS | Status: AC
  Administered 2017-11-06: 12:00:00 via INTRAVENOUS

## 2017-11-06 MED ORDER — PALONOSETRON HCL INJECTION 0.25 MG/5ML
INTRAVENOUS | Status: AC
  Filled 2017-11-06: qty 5

## 2017-11-06 MED ORDER — DOXORUBICIN HCL CHEMO IV INJECTION 2 MG/ML
60.0000 mg/m2 | Freq: Once | INTRAVENOUS | Status: AC
  Administered 2017-11-06: 112 mg via INTRAVENOUS
  Filled 2017-11-06: qty 56

## 2017-11-06 MED ORDER — SODIUM CHLORIDE 0.9 % IV SOLN
600.0000 mg/m2 | Freq: Once | INTRAVENOUS | Status: AC
  Administered 2017-11-06: 1120 mg via INTRAVENOUS
  Filled 2017-11-06: qty 56

## 2017-11-06 MED ORDER — FOSAPREPITANT DIMEGLUMINE INJECTION 150 MG
Freq: Once | INTRAVENOUS | Status: AC
  Administered 2017-11-06: 12:00:00 via INTRAVENOUS
  Filled 2017-11-06: qty 5

## 2017-11-06 NOTE — Patient Instructions (Signed)
[facility] Discharge Instructions for Patients Receiving Chemotherapy  Today you received the following chemotherapy agents: Doxorubicin, Cyclophosphamide  To help prevent nausea and vomiting after your treatment, we encourage you to take your nausea medication as directed.    If you develop nausea and vomiting that is not controlled by your nausea medication, call the clinic.   BELOW ARE SYMPTOMS THAT SHOULD BE REPORTED IMMEDIATELY:  *FEVER GREATER THAN 100.5 F  *CHILLS WITH OR WITHOUT FEVER  NAUSEA AND VOMITING THAT IS NOT CONTROLLED WITH YOUR NAUSEA MEDICATION  *UNUSUAL SHORTNESS OF BREATH  *UNUSUAL BRUISING OR BLEEDING  TENDERNESS IN MOUTH AND THROAT WITH OR WITHOUT PRESENCE OF ULCERS  *URINARY PROBLEMS  *BOWEL PROBLEMS  UNUSUAL RASH Items with * indicate a potential emergency and should be followed up as soon as possible.  Feel free to call the clinic should you have any questions or concerns. The clinic phone number is [phone].  Please show the CHEMO ALERT CARD at check-in to the Emergency Department and triage nurse.    

## 2017-11-06 NOTE — Telephone Encounter (Signed)
No 5/30 LOS

## 2017-11-07 ENCOUNTER — Encounter: Payer: Self-pay | Admitting: Genetic Counselor

## 2017-11-07 ENCOUNTER — Inpatient Hospital Stay: Payer: Medicaid Other

## 2017-11-07 ENCOUNTER — Inpatient Hospital Stay (HOSPITAL_BASED_OUTPATIENT_CLINIC_OR_DEPARTMENT_OTHER): Payer: Medicaid Other | Admitting: Genetic Counselor

## 2017-11-07 ENCOUNTER — Telehealth: Payer: Self-pay | Admitting: *Deleted

## 2017-11-07 ENCOUNTER — Telehealth: Payer: Self-pay

## 2017-11-07 VITALS — BP 155/80 | HR 82 | Temp 97.9°F | Resp 20

## 2017-11-07 DIAGNOSIS — C50811 Malignant neoplasm of overlapping sites of right female breast: Secondary | ICD-10-CM

## 2017-11-07 DIAGNOSIS — Z170 Estrogen receptor positive status [ER+]: Principal | ICD-10-CM

## 2017-11-07 DIAGNOSIS — Z1501 Genetic susceptibility to malignant neoplasm of breast: Secondary | ICD-10-CM | POA: Diagnosis not present

## 2017-11-07 DIAGNOSIS — Z0282 Encounter for adoption services: Secondary | ICD-10-CM | POA: Insufficient documentation

## 2017-11-07 DIAGNOSIS — Z5111 Encounter for antineoplastic chemotherapy: Secondary | ICD-10-CM | POA: Diagnosis not present

## 2017-11-07 LAB — CANCER ANTIGEN 27.29: CAN 27.29: 53.4 U/mL — AB (ref 0.0–38.6)

## 2017-11-07 MED ORDER — PEGFILGRASTIM-CBQV 6 MG/0.6ML ~~LOC~~ SOSY
PREFILLED_SYRINGE | SUBCUTANEOUS | Status: AC
  Filled 2017-11-07: qty 0.6

## 2017-11-07 MED ORDER — PEGFILGRASTIM-CBQV 6 MG/0.6ML ~~LOC~~ SOSY
6.0000 mg | PREFILLED_SYRINGE | Freq: Once | SUBCUTANEOUS | Status: AC
  Administered 2017-11-07: 6 mg via SUBCUTANEOUS

## 2017-11-07 NOTE — Telephone Encounter (Signed)
Spoke with patient per [name] alright to take Q10, patient verbalized an understanding.

## 2017-11-07 NOTE — Telephone Encounter (Signed)
Patient calls stating the she read she should be taking Q10 enzyme. She has ordered it and wants to know if it is okay for her start it when she receives.

## 2017-11-07 NOTE — Telephone Encounter (Signed)
Yes, it will be fine to take Q10. Thanks   [name] MD

## 2017-11-07 NOTE — Patient Instructions (Signed)
Pegfilgrastim injection What is this medicine? PEGFILGRASTIM (PEG fil gra stim) is a long-acting granulocyte colony-stimulating factor that stimulates the growth of neutrophils, a type of white blood cell important in the body's fight against infection. It is used to reduce the incidence of fever and infection in patients with certain types of cancer who are receiving chemotherapy that affects the bone marrow, and to increase survival after being exposed to high doses of radiation. This medicine may be used for other purposes; ask your health care provider or pharmacist if you have questions. COMMON BRAND NAME(S): Neulasta What should I tell my health care provider before I take this medicine? They need to know if you have any of these conditions: -kidney disease -latex allergy -ongoing radiation therapy -sickle cell disease -skin reactions to acrylic adhesives (On-Body Injector only) -an unusual or allergic reaction to pegfilgrastim, filgrastim, other medicines, foods, dyes, or preservatives -pregnant or trying to get pregnant -breast-feeding How should I use this medicine? This medicine is for injection under the skin. If you get this medicine at home, you will be taught how to prepare and give the pre-filled syringe or how to use the On-body Injector. Refer to the patient Instructions for Use for detailed instructions. Use exactly as directed. Tell your healthcare provider immediately if you suspect that the On-body Injector may not have performed as intended or if you suspect the use of the On-body Injector resulted in a missed or partial dose. It is important that you put your used needles and syringes in a special sharps container. Do not put them in a trash can. If you do not have a sharps container, call your pharmacist or healthcare provider to get one. Talk to your pediatrician regarding the use of this medicine in children. While this drug may be prescribed for selected conditions,  precautions do apply. Overdosage: If you think you have taken too much of this medicine contact a poison control center or emergency room at once. NOTE: This medicine is only for you. Do not share this medicine with others. What if I miss a dose? It is important not to miss your dose. Call your doctor or health care professional if you miss your dose. If you miss a dose due to an On-body Injector failure or leakage, a new dose should be administered as soon as possible using a single prefilled syringe for manual use. What may interact with this medicine? Interactions have not been studied. Give your health care provider a list of all the medicines, herbs, non-prescription drugs, or dietary supplements you use. Also tell them if you smoke, drink alcohol, or use illegal drugs. Some items may interact with your medicine. This list may not describe all possible interactions. Give your health care provider a list of all the medicines, herbs, non-prescription drugs, or dietary supplements you use. Also tell them if you smoke, drink alcohol, or use illegal drugs. Some items may interact with your medicine. What should I watch for while using this medicine? You may need blood work done while you are taking this medicine. If you are going to need a MRI, CT scan, or other procedure, tell your doctor that you are using this medicine (On-Body Injector only). What side effects may I notice from receiving this medicine? Side effects that you should report to your doctor or health care professional as soon as possible: -allergic reactions like skin rash, itching or hives, swelling of the face, lips, or tongue -dizziness -fever -pain, redness, or irritation at site   where injected -pinpoint red spots on the skin -red or dark-brown urine -shortness of breath or breathing problems -stomach or side pain, or pain at the shoulder -swelling -tiredness -trouble passing urine or change in the amount of urine Side  effects that usually do not require medical attention (report to your doctor or health care professional if they continue or are bothersome): -bone pain -muscle pain This list may not describe all possible side effects. Call your doctor for medical advice about side effects. You may report side effects to FDA at 1-800-FDA-1088. Where should I keep my medicine? Keep out of the reach of children. Store pre-filled syringes in a refrigerator between 2 and 8 degrees C (36 and 46 degrees F). Do not freeze. Keep in carton to protect from light. Throw away this medicine if it is left out of the refrigerator for more than 48 hours. Throw away any unused medicine after the expiration date. NOTE: This sheet is a summary. It may not cover all possible information. If you have questions about this medicine, talk to your doctor, pharmacist, or health care provider.  2018 Elsevier/Gold Standard (2016-05-23 12:58:03)  

## 2017-11-07 NOTE — Progress Notes (Signed)
[facility]      Initial Visit   Patient Name: Darlene Beasley Patient DOB: 1956-04-20 Patient Age: 62 y.o. Encounter Date: 11/07/2017  Referring Provider: [name], MD  Primary Care Provider: [name], MD  Reason for Visit: Evaluate for hereditary susceptibility to cancer    Assessment and Plan:  . [name]'s history is not highly suggestive of a hereditary predisposition to cancer, but given that she has bilateral breast cancers and has no information about her biological, a genetics evaluation is indicated.  . Testing is recommended to determine whether she has a pathogenic mutation that will impact her screening and risk-reduction for cancer. A negative result will be reassuring.  . [name] wished to pursue genetic testing and a blood sample will be sent for analysis of the 83 genes on [facility]'s Multi-Cancer panel (ALK, APC, ATM, AXIN2, BAP1, BARD1, BLM, BMPR1A, BRCA1, BRCA2, BRIP1, CASR, CDC73, CDH1, CDK4, CDKN1B, CDKN1C, CDKN2A, CEBPA, CHEK2, CTNNA1, DICER1, DIS3L2, EGFR, EPCAM, FH, FLCN, GATA2, GPC3, GREM1, HOXB13, HRAS, KIT, MAX, MEN1, MET, MITF, MLH1, MSH2, MSH3, MSH6, MUTYH, NBN, NF1, NF2, NTHL1, PALB2, PDGFRA, PHOX2B, PMS2, POLD1, POLE, POT1, PRKAR1A, PTCH1, PTEN, RAD50, RAD51C, RAD51D, RB1, RECQL4, RET, RUNX1, SDHA, SDHAF2, SDHB, SDHC, SDHD, SMAD4, SMARCA4, SMARCB1, SMARCE1, STK11, SUFU, TERC, TERT, TMEM127, TP53, TSC1, TSC2, VHL, WRN, WT1).   . Results should be available in approximately 2-3 weeks, at which point we will contact her and address implications for her as well as address genetic testing for at-risk family members, if needed.     [name] was available for questions concerning this case. Total time spent by me in face-to-face counseling was approximately 30 minutes.   _____________________________________________________________________   History of Present Illness: Ms. Darlene Beasley, a 62 y.o. female, is being  seen at the [facility] due to a personal history of bilateral breast cancers. She presents to clinic today to discuss the possibility of a hereditary predisposition to cancer and discuss whether genetic testing is warranted.  [name] was diagnosed with bilateral cancer at the age of 62. She is currently undergoing neoadjuvant chemotherapy.    Oncology History   Cancer Staging Cancer of overlapping sites of right female breast [facility]) Staging form: Breast, AJCC 8th Edition - Clinical stage from 09/23/2017: Stage IIIA (cT3, cN1, cM0, G2, ER+, PR-, HER2-) - Signed by [name], MD on 10/09/2017 - Clinical: cT3 - Unsigned       Cancer of overlapping sites of right female breast ([place])   09/09/2017 Mammogram    IMPRESSION: 1. Highly suspicious large right breast mass involving the outer and upper breast extending from approximately 9 o'clock through 12-1 o'clock, maximum measurement approximating 7.5 cm. The mass involves the right nipple, accounting for nipple retraction. Architectural distortion, microcalcifications and diffuse trabecular thickening and skin thickening are associated with the large mass, raising the possibility of this representing an inflammatory cancer. 2. Satellite masses separate from the dominant mass involving the lower outer quadrant and the upper inner quadrant of the right breast, measured above. 3. Two adjacent pathologic right axillary lymph nodes. 4. Indeterminate 0.9 cm solid mass involving the upper outer quadrant of the left breast which accounts for a mammographic finding. 5. No pathologic left axillary lymphadenopathy.      09/09/2017 Breast US    Targeted right breast ultrasound is performed, showing a very large hypoechoic mass with irregular margins extending from the approximate 9 o'clock position  through the 12 to 1 o'clock position. On the CC gait image, the mass measures maximally approximately 7.5 cm. The mass has irregular margins,  demonstrates acoustic shadowing, and demonstrates internal power Doppler flow. The mass extends into the retroareolar region and involves the nipple, accounting for the nipple retraction.  Separate from the dominant mass at the 7 o'clock position approximately 4 cm from nipple is a hypoechoic antiparallel mass with irregular margins measuring approximately 1.4 x 2.7 x 1.7 cm.  Separate from the dominant mass at the 1 o'clock position approximately 2 cm from nipple is a hypoechoic mass with irregular margins measuring approximately 1.7 x 0.5 x 1.6 cm. Both of these masses also demonstrate internal power Doppler flow.  Sonographic evaluation of the right axilla demonstrates 2 adjacent pathologic lymph nodes, the larger measuring approximately 2.5 x 2.5 x 2.8 cm, the smaller measuring approximately 2.0 x 0.8 x 2.7 cm.      09/23/2017 Initial Biopsy    Diagnosis 1. Breast, left, needle core biopsy, 2 o'clock - INVASIVE DUCTAL CARCINOMA. - SEE COMMENT. 2. Breast, right, needle core biopsy, 11:30 o'clock - INVASIVE MAMMARY CARCINOMA. - SEE COMMENT. 3. Lymph node, needle/core biopsy, right axilla - METASTATIC MAMMARY CARCINOMA IN 1 OF 1 LYMPH NODE (1/1). Microscopic Comment 1. There is a small focus of grade I invasive ductal carcinoma. A complete breast prognostic profile will be attempted and the results reported separately. The results were called to the [facility] on 09/24/2017. 2. The carcinoma appears grade II. An E-Cadherin stain and a breast prognostic profile will be performed on part 2 and the results reported separately. (JBK:kh 09-24-17) [name]  1. HER2 Negative by FISH Estrogen receptor: 100% positive, strong staining Progesterone receptor: 80% positive, strong staining Ki67: 5%  2. HER2 Negative by FISH Estrogen receptor: 100% positive, strong staining Progesterone receptor: 0%, negative  Ki67: 30% 2. The tumor cells are strongly positive for E-cadherin,  supporting a ductal phenotype      09/23/2017 Cancer Staging    Staging form: Breast, AJCC 8th Edition - Clinical stage from 09/23/2017: Stage IIIA (cT3, cN1, cM0, G2, ER+, PR-, HER2-) - Signed by [name], MD on 10/09/2017      10/07/2017 Breast MRI    IMPRESSION: 1. Biopsy proven malignancy involving all 4 quadrants of the right breast, although predominantly located in the anterior third of the central upper right breast measures 6.8 x 9.6 x 7.2 cm.  2. Five pathologic right axillary lymph nodes, compatible with biopsy proven axillary metastases.  3. Small mass with associated biopsy related changes in the upper-outer left breast at site of known malignancy measures 0.9 x 0.8 x 0.7 cm. No abnormal lymph nodes seen in the left axilla.  RECOMMENDATION: Treatment plan for known bilateral breast malignancy.  BI-RADS CATEGORY  6: Known biopsy-proven malignancy.      10/08/2017 Initial Diagnosis    Cancer of overlapping sites of right female breast ([place])      10/13/2017 Pathology Results    Diagnosis 1. Breast, right, needle core biopsy, 1 o'clock - INVASIVE DUCTAL CARCINOMA. - SEE COMMENT. 2. Breast, right, needle core biopsy, 7 o'clock - INVASIVE DUCTAL CARCINOMA. - SEE COMMENT. Microscopic Comment 1. and 2. The carcinoma in parts 1 and 2 is morphologically similar and appears grade II. Because breast prognostic profiles were performed on the patients previous case, [id number], it will not be repeated on the current case unless requested.       10/17/2017 Imaging  CT CAP IMPRESSION: 1. Infiltrative right breast mass with overlying skin thickening is identified compatible with known breast cancer. 2. Enlarged right axillary lymph nodes compatible with metastatic adenopathy. 3. No additional sites of disease identified within the chest and no evidence for metastasis to the abdomen or pelvis. 4. Aortic atherosclerosis and 3 vessel coronary artery atherosclerotic  calcifications. Aortic Atherosclerosis (ICD10-I70.0).      10/17/2017 Imaging    Bone Scan FINDINGS: There are no foci of increased or decreased radiotracer uptake to suggest osseous metastatic disease. 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 There is degenerative type uptake within both shoulders and the left ankle.  Normal physiologic activity is identified within the kidneys and urinary bladder.  IMPRESSION: No evidence of osseous metastatic disease.      10/22/2017 -  Chemotherapy    AC q2 weeks x4 cycles starting 10/22/17, followed by weekly taxol x12       Malignant neoplasm of left breast in female, estrogen receptor positive ([place])   09/09/2017 Breast US    Targeted left breast ultrasound is performed, showing an oval parallel hypoechoic mass containing microcalcifications at the 2 o'clock position approximately 6 cm from nipple measuring approximately 0.9 x 0.5 x 0.9 cm, corresponding to the mammographic finding.  Sonographic evaluation of the left axilla demonstrates no pathologic lymphadenopathy.      09/23/2017 Initial Biopsy    Diagnosis 1. Breast, left, needle core biopsy, 2 o'clock - INVASIVE DUCTAL CARCINOMA. - SEE COMMENT. 2. Breast, right, needle core biopsy, 11:30 o'clock - INVASIVE MAMMARY CARCINOMA. - SEE COMMENT. 3. Lymph node, needle/core biopsy, right axilla - METASTATIC MAMMARY CARCINOMA IN 1 OF 1 LYMPH NODE (1/1). Microscopic Comment 1. There is a small focus of grade I invasive ductal carcinoma. A complete breast prognostic profile will be attempted and the results reported separately. The results were called to the [facility] on 09/24/2017. 2. The carcinoma appears grade II. An E-Cadherin stain and a breast prognostic profile will be performed on part 2 and the results reported separately. (JBK:kh 09-24-17) [name]  1. HER2 Negative  by FISH Estrogen receptor: 100% positive, strong staining Progesterone receptor: 80% positive, strong staining Ki67: 5%  2. HER2 Negative by FISH Estrogen receptor: 100% positive, strong staining Progesterone receptor: 0%, negative  Ki67: 30%      10/08/2017 Initial Diagnosis    Malignant neoplasm of left breast in female, estrogen receptor positive ([place])       Past Medical History:  Diagnosis Date  . Adopted   . Arthritis   . breast ca dx'd 08/2017  . GERD (gastroesophageal reflux disease)     Past Surgical History:  Procedure Laterality Date  . IR FLUORO GUIDE PORT INSERTION LEFT  10/20/2017  . IR US GUIDE VASC ACCESS LEFT  10/20/2017  . TONSILLECTOMY Bilateral   . TUBAL LIGATION      Social History   Socioeconomic History  . Marital status: Significant Other    Spouse name: Not on file  . Number of children: 1  . Years of education: Not on file  . Highest education level: Not on file  Occupational History  . Not on file  Social Needs  . Financial resource strain: Not on file  . Food insecurity:    Worry: Not on file    Inability: Not on file  . Transportation needs:    Medical: Not on file  Non-medical: Not on file  Tobacco Use  . Smoking status: Current Every Day Smoker    Packs/day: 0.50    Years: 51.00    Pack years: 25.50    Types: Cigarettes  . Smokeless tobacco: Never Used  . Tobacco comment: Working on quitting.  Substance and Sexual Activity  . Alcohol use: No    Comment: stopped 08/2017   . 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 Sexual activity: Not Currently  Lifestyle  . Physical activity:    Days per week: 7 days    Minutes per session: 80 min  . Stress: Very much  Relationships  . Social connections:    Talks on phone: Never    Gets together: Never    Attends religious service: Never    Active member of club or organization: No    Attends meetings of clubs or organizations: Never    Relationship  status: Living with partner  Other Topics Concern  . Not on file  Social History Narrative  . Not on file     Family History:  Ms. Dudenhoeffer is adopted and has no information about her biological family other than she knows she had 10 siblings. She has one son (age 49) and he has a son and 2 daughters.  Ms. Wayson's ancestry is Caucasian - NOS. There is no known Jewish ancestry and no known consanguinity.  Discussion: We reviewed the characteristics, features and inheritance patterns of hereditary cancer syndromes. We discussed her risk of harboring a mutation in the context of her personal and family history. We discussed that her unknown family history makes risk assessment challenging. We discussed the process of genetic testing, insurance coverage and implications of results: positive, negative and variant of unknown significance (VUS).    Ms. Savarino questions were answered to her satisfaction today and she is welcome to call with any additional questions or concerns. Thank you for the referral and allowing [place] to share in the care of your patient.    Steele Berg, MS, [place] Certified Genetic Counselor phone: [phone] [email or web address]

## 2017-11-14 ENCOUNTER — Ambulatory Visit: Payer: Self-pay | Admitting: Genetic Counselor

## 2017-11-14 ENCOUNTER — Encounter: Payer: Self-pay | Admitting: Genetic Counselor

## 2017-11-14 DIAGNOSIS — Z1509 Genetic susceptibility to other malignant neoplasm: Secondary | ICD-10-CM

## 2017-11-14 DIAGNOSIS — Z1379 Encounter for other screening for genetic and chromosomal anomalies: Secondary | ICD-10-CM

## 2017-11-14 DIAGNOSIS — Z1589 Genetic susceptibility to other disease: Secondary | ICD-10-CM

## 2017-11-14 HISTORY — DX: Genetic susceptibility to other malignant neoplasm: Z15.09

## 2017-11-14 HISTORY — DX: Encounter for other screening for genetic and chromosomal anomalies: Z13.79

## 2017-11-14 NOTE — Progress Notes (Signed)
[facility]         Results Disclosure   Patient Name: Darlene Beasley Patient DOB: 03/16/1956 Patient Age: 62 y.o. Encounter Date: 11/14/2017  Referring Provider: [name], MD  Reason for Call: Discuss results of genetic testing   Darlene Beasley was seen in the [facility] on 11/07/17 due to a personal history of bilateral breast cancers. She was adopted and has no information about her biological family. Please refer to the prior Genetics clinic note for more information regarding Darlene Beasley's history and the assessment at the time.   Genetic Testing: At the time of Darlene Beasley's visit, she chose to pursue genetic testing of multiple genes associated with hereditary susceptibility to cancer. Testing included sequencing and deletion/duplication analysis. 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  A copy of the genetic test report will be scanned into Epic under the Media tab.  The genes that were analyzed were the 83 genes on [facility]'s Multi-Cancer panel (ALK, APC, ATM, AXIN2, BAP1, BARD1, BLM, BMPR1A, BRCA1, BRCA2, BRIP1, CASR, CDC73, CDH1, CDK4, CDKN1B, CDKN1C, CDKN2A, CEBPA, CHEK2, CTNNA1, DICER1, DIS3L2, EGFR, EPCAM, FH, FLCN, GATA2, GPC3, GREM1, HOXB13, HRAS, KIT, MAX, MEN1, MET, MITF, MLH1, MSH2, MSH3, MSH6, MUTYH, NBN, NF1, NF2, NTHL1, PALB2, PDGFRA, PHOX2B, PMS2, POLD1, POLE, POT1, PRKAR1A, PTCH1, PTEN, RAD50, RAD51C, RAD51D, RB1, RECQL4, RET, RUNX1, SDHA, SDHAF2, SDHB, SDHC, SDHD, SMAD4, SMARCA4, SMARCB1, SMARCE1, STK11, SUFU, TERC, TERT, TMEM127, TP53, TSC1, TSC2, VHL, WRN, WT1).  A Variant of Unknown Significance (VUS) was also detected: POLD1 c.301A>T (p.Ile101Phe). While at this time, it is unknown if this finding is associated with increased cancer risk, the majority of these variants get reclassified to be inconsequential. We emphasized that medical management should not be based on this finding. With  time, we suspect the lab will determine the significance, if any. If we do learn more about it, we will try to contact Darlene Beasley to discuss it further. It is important to stay in touch with [place] periodically and keep the address and phone number up to date.  Medical Management: There are currently no established cancer screening guidelines for individuals with a pathogenic MITF mutation. Cancer screening should ultimately be guided by personal and family history.   Recommendations due to increased risk of melanoma can include: - Regular dermatologic and eye exams for melanoma  - Monthly self-examination of the skin  - Sun protection strategies such as wearing a hat, sunglasses and long protective clothing, applying sunscreen with SPF of 30 or higher and avoiding tanning beds and sun lamps.  There are no recommended screenings at this time for kidney cancer, but a discussion with a urologist about a kidney ultrasound is reasonable.    Family Members: It is important that Darlene Beasley informs her son of this result. He has a 50% chance of having this MITF mutation. He is recommended to speak with a genetic counselor prior to any testing given the ambiguity of cancer risks with this gene. A genetic counselor can be located at [email or web address].   We encouraged Darlene Beasley to remain in contact with [place] on an annual basis so we can update her personal and family histories, and let her know of advances in cancer genetics that may benefit the family. Our contact number was provided and she knows she is welcome to call anytime with additional questions.    Steele Berg, MS, [place] Certified Genetic Counselor phone: [phone]

## 2017-11-18 NOTE — Progress Notes (Signed)
Darlene Beasley  [phone] [phone]  Clinic Follow Up Note   Patient Care Team: [name], MD as PCP - General (Family Medicine) [name], MD as Consulting Physician (General Surgery) [name], MD as Consulting Physician (Hematology)   Date of Service:  11/19/2017  CHIEF COMPLAINTS:  Follow up for Bilateral Breast Cancer   Oncology History   Cancer Staging Cancer of overlapping sites of right female breast [facility]) Staging form: Breast, AJCC 8th Edition - Clinical stage from 09/23/2017: Stage IIIA (cT3, cN1, cM0, G2, ER+, PR-, HER2-) - Signed by [name], MD on 10/09/2017 - Clinical: cT3 - Unsigned       Cancer of overlapping sites of right female breast (Darlene Beasley)   09/09/2017 Mammogram    IMPRESSION: 1. Highly suspicious large right breast mass involving the outer and upper breast extending from approximately 9 o'clock through 12-1 o'clock, maximum measurement approximating 7.5 cm. The mass involves the right nipple, accounting for nipple retraction. Architectural distortion, microcalcifications and diffuse trabecular thickening and skin thickening are associated with the large mass, raising the possibility of this representing an inflammatory cancer. 2. Satellite masses separate from the dominant mass involving the lower outer quadrant and the upper inner quadrant of the right breast, measured above. 3. Two adjacent pathologic right axillary lymph nodes. 4. Indeterminate 0.9 cm solid mass involving the upper outer quadrant of the left breast which accounts for a mammographic finding. 5. No pathologic left axillary lymphadenopathy.      09/09/2017 Breast US    Targeted right breast ultrasound is performed, showing a very large hypoechoic mass with irregular margins extending from the approximate 9 o'clock position through the 12 to 1 o'clock position. On the CC gait image, the mass measures maximally approximately 7.5 cm. The mass has  irregular margins, demonstrates acoustic shadowing, and demonstrates internal power Doppler flow. The mass extends into the retroareolar region and involves the nipple, accounting for the nipple retraction.  Separate from the dominant mass at the 7 o'clock position approximately 4 cm from nipple is a hypoechoic antiparallel mass with irregular margins measuring approximately 1.4 x 2.7 x 1.7 cm.  Separate from the dominant mass at the 1 o'clock position approximately 2 cm from nipple is a hypoechoic mass with irregular margins measuring approximately 1.7 x 0.5 x 1.6 cm. Both of these masses also demonstrate internal power Doppler flow.  Sonographic evaluation of the right axilla demonstrates 2 adjacent pathologic lymph nodes, the larger measuring approximately 2.5 x 2.5 x 2.8 cm, the smaller measuring approximately 2.0 x 0.8 x 2.7 cm.      09/23/2017 Initial Biopsy    Diagnosis 1. Breast, left, needle core biopsy, 2 o'clock - INVASIVE DUCTAL CARCINOMA. - SEE COMMENT. 2. Breast, right, needle core biopsy, 11:30 o'clock - INVASIVE MAMMARY CARCINOMA. - SEE COMMENT. 3. Lymph node, needle/core biopsy, right axilla - METASTATIC MAMMARY CARCINOMA IN 1 OF 1 LYMPH NODE (1/1). Microscopic Comment 1. There is a small focus of grade I invasive ductal carcinoma. A complete breast prognostic profile will be attempted and the results reported separately. The results were called to the [place] on 09/24/2017. 2. The carcinoma appears grade II. An E-Cadherin stain and a breast prognostic profile will be performed on part 2 and the results reported separately. (JBK:kh 09-24-17) [name]  1. HER2 Negative by FISH Estrogen receptor: 100% positive, strong staining Progesterone receptor: 80% positive, strong staining Ki67: 5%  2. HER2 Negative by FISH Estrogen receptor: 100%  positive, strong staining Progesterone receptor: 0%, negative  Ki67: 30% 2. The tumor cells are strongly  positive for E-cadherin, supporting a ductal phenotype      09/23/2017 Cancer Staging    Staging form: Breast, AJCC 8th Edition - Clinical stage from 09/23/2017: Stage IIIA (cT3, cN1, cM0, G2, ER+, PR-, HER2-) - Signed by [name], MD on 10/09/2017      10/07/2017 Breast MRI    IMPRESSION: 1. Biopsy proven malignancy involving all 4 quadrants of the right breast, although predominantly located in the anterior third of the central upper right breast measures 6.8 x 9.6 x 7.2 cm.  2. Five pathologic right axillary lymph nodes, compatible with biopsy proven axillary metastases.  3. Small mass with associated biopsy related changes in the upper-outer left breast at site of known malignancy measures 0.9 x 0.8 x 0.7 cm. No abnormal lymph nodes seen in the left axilla.  RECOMMENDATION: Treatment plan for known bilateral breast malignancy.  BI-RADS CATEGORY  6: Known biopsy-proven malignancy.      10/08/2017 Initial Diagnosis    Cancer of overlapping sites of right female breast ([name])      10/13/2017 Pathology Results    Diagnosis 1. Breast, right, needle core biopsy, 1 o'clock - INVASIVE DUCTAL CARCINOMA. - SEE COMMENT. 2. Breast, right, needle core biopsy, 7 o'clock - INVASIVE DUCTAL CARCINOMA. - SEE COMMENT. Microscopic Comment 1. and 2. The carcinoma in parts 1 and 2 is morphologically similar and appears grade II. Because breast prognostic profiles were performed on the patients previous case, [id number], it will not be repeated on the current case unless requested.       10/17/2017 Imaging    CT CAP IMPRESSION: 1. Infiltrative right breast mass with overlying skin thickening is identified compatible with known breast cancer. 2. Enlarged right axillary lymph nodes compatible with metastatic adenopathy. 3. No additional sites of disease identified within the chest and no evidence for metastasis to the abdomen or pelvis. 4. Aortic atherosclerosis and 3 vessel coronary  artery atherosclerotic calcifications. Aortic Atherosclerosis (ICD10-I70.0).      10/17/2017 Imaging    Bone Scan FINDINGS: There are no foci of increased or decreased radiotracer uptake to suggest osseous metastatic disease. 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 There is degenerative type uptake within both shoulders and the left ankle.  Normal physiologic activity is identified within the kidneys and urinary bladder.  IMPRESSION: No evidence of osseous metastatic disease.      10/22/2017 -  Chemotherapy    AC q2 weeks x4 cycles starting 10/22/17, followed by weekly taxol x12       Malignant neoplasm of left breast in female, estrogen receptor positive ([place])   09/09/2017 Breast US    Targeted left breast ultrasound is performed, showing an oval parallel hypoechoic mass containing microcalcifications at the 2 o'clock position approximately 6 cm from nipple measuring approximately 0.9 x 0.5 x 0.9 cm, corresponding to the mammographic finding.  Sonographic evaluation of the left axilla demonstrates no pathologic lymphadenopathy.      09/23/2017 Initial Biopsy    Diagnosis 1. Breast, left, needle core biopsy, 2 o'clock - INVASIVE DUCTAL CARCINOMA. - SEE COMMENT. 2. Breast, right, needle core biopsy, 11:30 o'clock - INVASIVE MAMMARY CARCINOMA. - SEE COMMENT. 3. Lymph node, needle/core biopsy, right axilla - METASTATIC MAMMARY CARCINOMA IN 1 OF 1 LYMPH NODE (1/1). Microscopic Comment 1. There is a small focus of grade I  invasive ductal carcinoma. A complete breast prognostic profile will be attempted and the results reported separately. The results were called to the [place] on 09/24/2017. 2. The carcinoma appears grade II. An E-Cadherin stain and a breast prognostic profile will be performed on part 2 and the results reported separately. (JBK:kh 09-24-17) JOSHUA  KISH  1. HER2 Negative by FISH Estrogen receptor: 100% positive, strong staining Progesterone receptor: 80% positive, strong staining Ki67: 5%  2. HER2 Negative by FISH Estrogen receptor: 100% positive, strong staining Progesterone receptor: 0%, negative  Ki67: 30%      10/08/2017 Initial Diagnosis    Malignant neoplasm of left breast in female, estrogen receptor positive ([place])       HISTORY OF PRESENTING ILLNESS:   [name] 62 y.o. female is here because of newly diagnosed bilateral breast cancer. In 03/2017 she "pinched" herself at work and developed a right breast bruise and soreness which finally resolved in 04/2017. In January 2019 she noticed right nipple inversion. She researched online and made plans to have a mammogram, last done 30 years ago. She had a palpable breast 'lump" which she felt after her son was born 55 years ago that was never biopsied and eventually resolved. She reports intentional weight loss.   Imaging revealed a large right breast mass involving the upper outer quadrant extending from 9 o'clock to 12-1 o'clock, measuring approximately 7.5 cm, involving the right nipple. There is a satellite lesion at the 7 o'clock position approximately 4 cm from the nipple with irregular margins measuring 1.4 x 2.7, x 1.7 cm. Additional satellite lesion at 1 o'clock position approximately 2 cm from the nipple measures 1.7 x 0.5 x 1.6 cm. [place] of right axilla reveals 2 adjacent pathologic lymph nodes, measuring 2.5 x 2.5 x 2.8 cm and 2.0 x 0.8 x 2.7 cm. Biopsy of the right breast revealed invasive mammary carcinoma, tumor cells are strongly positive for E-cadherin, supporting a ductal phenotype; grade II, HER2 negative, ER positive, PR negative, metastatic to right axilla. Targeted left breast US reveals hypoechoic mass at the 2 o'clock position approximately 6 cm from the nipple measuring 0.9 x 0.5 x 0.9 fm. No suspicious adenopathy in the left axilla. Biopsy of the left breast  revealed invasive ductal carcinoma, grade I, HER2 negative, ER/PR positive. Breast MRI revealed biopsy-proven malignancy involving all 4 quadrants of the right breast, predominantly in the anterior third of the central upper right breast measuring 6.8 x 9.6 x 7.2 cm with 5 pathologic right axillary lymph nodes, compatible with biopsy-proven axillary metastases. In the left breast there is a small mass with associated biopsy related changes in the upper outer left breast at site of known malignancy, no abnormal axillary lymph nodes.   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 Had GERD in the past. She has smoked 1-2 PPD cigarettes x51 years, currently weaning. She quit drinking alcohol when she changed her diet and began losing weight in 08/2017. She has a significant other with whom she resides. She is independent of all ADLs. She is adopted, family history unknown.  Today she feels well, she has lingering cough from recent URI. No fever or chills. She is fatigued at baseline due to busy job, she works 14-hour days, 60 hours per week, working at [employer] in [place]. She tolerated breast biopsy well with mild bruising and swelling.   GYN HISTORY  Menarchal: age 41 LMP: age [age] Contraceptive: age [age] OCP HRT: None G1P1   CURRENT THERAPY : Neoadjuvant chemo AC q2 weeks x4 cycles starting 10/22/17, followed by weekly taxol x12  INTERVAL HISTORY  Darlene Beasley is here for a follow up and cycle 3 AC. She presents to the clinic today by herself. She is doing well overall. She denies having nausea after her last cycle of chemotherapy and she notes that she takes her antiemetics daily and then cuts them down on the off week. She reports that following treatment, she was able to eat a lot more and notes that this is something that happens following treatment.   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 She doesn't always wear a mask.  On review of systems, she notes that her previous bowel issues was treated with miralax. she denies tingling, numbness, bowel issues, and any other symptoms. Pertinent positives are listed and detailed within the above HPI.    MEDICAL HISTORY:  Past Medical History:  Diagnosis Date  . Adopted   . Arthritis   . breast ca dx'd 08/2017  . Genetic testing 11/14/2017   Multi-Cancer panel (83 genes) @ [facility] - Pathogenic mutation in the MITF gene  . GERD (gastroesophageal reflux disease)   . 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 IR IMAGING GUIDED PORT INSERTION  10/20/2017  . IR US GUIDE VASC ACCESS LEFT  10/20/2017  . TONSILLECTOMY Bilateral   . TUBAL LIGATION      SOCIAL HISTORY: Social History   Socioeconomic History  . Marital status: Significant Other    Spouse name: Not on file  . Number of children: 1  . Years of education: Not on file  . Highest education level: Not on file  Occupational History  . Not on file  Social Needs  . Financial resource strain: Not on file  . Food insecurity:    Worry: Not on file    Inability: Not on file  . Transportation needs:    Medical: Not on file    Non-medical: Not on file  Tobacco Use  . Smoking status: Current Every Day Smoker    Packs/day: 0.50    Years: 51.00    Pack years: 25.50    Types: Cigarettes  . Smokeless tobacco: Never Used  . Tobacco comment: Working on quitting.  Substance and Sexual Activity  . Alcohol use: No    Comment: stopped 08/2017   . Drug use: Yes    Types: Marijuana    Comment: daily, since age 12   . Sexual activity: Not Currently  Lifestyle  . Physical activity:    Days per week: 7 days    Minutes per session: 80 min  . Stress: Very much  Relationships  . Social  connections:    Talks on phone: Never    Gets together: Never    Attends religious service: Never    Active member of club or organization: No    Attends meetings of clubs or organizations: Never    Relationship status: Living with partner  . Intimate partner violence:    Fear of current or ex partner: No    Emotionally abused: No    Physically abused: No    Forced sexual activity: No  Other Topics Concern  . Not on file  Social History Narrative  . Not on file    FAMILY HISTORY: Family History  Adopted: Yes  Problem Relation Age of Onset  . Congestive Heart Failure Mother   . Congestive Heart Failure Father     ALLERGIES:  is allergic to bee venom.  MEDICATIONS:  Current Outpatient Medications  Medication Sig Dispense Refill  . lidocaine-prilocaine (EMLA) cream Apply to affected area once 30 g 3  . ondansetron (ZOFRAN) 8 MG tablet Take 1 tablet (8 mg total) by mouth 2 (two) times daily as needed. Start on the third day after chemotherapy. 30 tablet 1  . prochlorperazine (COMPAZINE) 10 MG tablet Take 1 tablet (10 mg total) by mouth every 6 (six) hours as needed (Nausea or vomiting). 60 tablet 1   No current facility-administered medications for this visit.     REVIEW OF SYSTEMS:   Constitutional: Denies fevers, chills or abnormal night sweats (+) trouble sleep Eyes: Denies blurriness of vision, double vision or watery eyes Ears, nose, mouth, throat, and face: Denies mucositis or sore throat Respiratory: Denies cough, dyspnea or wheezes Cardiovascular: Denies palpitation, chest discomfort or lower extremity swelling Gastrointestinal:  Denies nausea, heartburn (+) constipation Skin: Denies abnormal skin rashes (+) poison ivy rash on anterior right ankle Lymphatics: Denies new lymphadenopathy or easy bruising Neurological:Denies numbness, tingling or new weaknesses Behavioral/Psych: Mood is stable, no new changes  All other systems were reviewed with the patient and are  negative.  PHYSICAL EXAMINATION:  ECOG PERFORMANCE STATUS: 0 - Asymptomatic  Vitals:   11/19/17 1046  BP: (!) 154/86  Pulse: 80  Resp: 18  Temp: 98.4 F (36.9 C)  SpO2: 98%   Filed Weights   11/19/17 1046  Weight: 168 lb 1.6 oz (76.2 kg)    GENERAL:alert, no distress and comfortable SKIN: skin color, texture, turgor are normal, no rashes or significant lesions EYES: normal, conjunctiva are pink and non-injected, sclera clear OROPHARYNX:no exudate, no erythema and lips, buccal mucosa, and tongue normal  NECK: supple, thyroid normal size, non-tender, without nodularity LYMPH:  no palpable lymphadenopathy in the cervical, axillary or inguinal LUNGS: clear to auscultation and percussion with normal breathing effort HEART: regular rate & rhythm and no murmurs and no lower extremity edema ABDOMEN:abdomen soft, non-tender and normal bowel sounds Musculoskeletal:no cyanosis of digits and no clubbing  PSYCH: alert & oriented x 3 with fluent speech NEURO: no focal motor/sensory deficits BREAST: Palpable Left and right axillary lymph nodes are softer. No palpable mass in left breast. Skin hyperpigmentation of right nipple, 7x6  cm mass in center of right breast, now softer. She had a 1 x 1.5 cm mobile nodule to her right axilla. Mild skin edema. No ulcers, bleeding, or discharge.    LABORATORY DATA:  I have reviewed the data as listed CBC Latest Ref Rng & Units 11/19/2017 11/06/2017 10/22/2017  WBC 3.9 - 10.3 K/uL 11.4(H) 11.5(H) 6.2  Hemoglobin 11.6 - 15.9 g/dL 11.8 12.2 14.1  Hematocrit 34.8 - 46.6 % 34.9 35.9 42.0  Platelets 145 - 400 K/uL 214 253 198    CMP Latest Ref Rng & Units 11/19/2017 11/06/2017 10/22/2017  Glucose 70 - 140 mg/dL 107 95 92  BUN 7 - 26 mg/dL '18 23 20  '$ Creatinine 0.60 - 1.10 mg/dL 0.75 0.84 0.82  Sodium 136 - 145 mmol/L 143 142 141  Potassium 3.5 - 5.1 mmol/L 4.0 4.1 4.0  Chloride 98 - 109 mmol/L 107 108 108  CO2 22 - 29 mmol/L '27 26 27  '$ Calcium 8.4 -  10.4  mg/dL 9.6 8.7 9.2  Total Protein 6.4 - 8.3 g/dL 6.4 5.9(L) 6.2(L)  Total Bilirubin 0.2 - 1.2 mg/dL <0.2(L) <0.2(L) 0.5  Alkaline Phos 40 - 150 U/L 104 84 62  AST 5 - 34 U/L '13 11 14  '$ ALT 0 - 55 U/L '12 12 16     '$ PATHOLOGY   Diagnosis 10/13/17 1. Breast, right, needle core biopsy, 1 o'clock - INVASIVE DUCTAL CARCINOMA. - SEE COMMENT. 2. Breast, right, needle core biopsy, 7 o'clock - INVASIVE DUCTAL CARCINOMA. - SEE COMMENT. Microscopic Comment 1. and 2. The carcinoma in parts 1 and 2 is morphologically similar and appears grade II. Because breast prognostic profiles were performed on the patients previous case, [id number], it will not be repeated on the current case unless requested.     Diagnosis 09/23/17 1. Breast, left, needle core biopsy, 2 o'clock - INVASIVE DUCTAL CARCINOMA. - SEE COMMENT. 2. Breast, right, needle core biopsy, 11:30 o'clock - INVASIVE MAMMARY CARCINOMA. - SEE COMMENT. 3. Lymph node, needle/core biopsy, right axilla - METASTATIC MAMMARY CARCINOMA IN 1 OF 1 LYMPH NODE (1/1). Microscopic Comment 1. There is a small focus of grade I invasive ductal carcinoma. A complete breast prognostic profile will be attempted and the results reported separately. The results were called to the [place] on 09/24/2017. 2. The carcinoma appears grade II. An E-Cadherin stain and a breast prognostic profile will be performed on part 2 and the results reported separately. (JBK:kh 09-24-17) [name] 1. HER2 Negative by FISH Estrogen receptor: 100% positive, strong staining Progesterone receptor: 80% positive, strong staining Ki67: 5% 2. HER2 Negative by FISH Estrogen receptor: 100% positive, strong staining Progesterone receptor: 0%, negative  Ki67: 30% 2. The tumor cells are strongly positive for E-cadherin, supporting a ductal phenotype     RADIOGRAPHIC STUDIES: I have personally reviewed the radiological images as listed and agreed with the  findings in the report. No results found.  ASSESSMENT & PLAN:  Darlene Beasley is a 62 y.o. Caucasian female with history of substance use presented with right nipple inversion for 3 months.    1. 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 She has locally advanced right breast cancer with a dominant 9.6 cm mass involving all 4 quadrants of the breast involving 5 pathologic axillary lymph nodes. She has a small left side breast cancer as well. She saw Dr. Dalbert Batman who discussed surgical options, including right mastectomy and possible left breast conserving surgery with subsequent radiation. She also met with Dr. Lisbeth Renshaw. I discussed the advantages of neoadjuvant chemotherapy and recommends AC x4 followed by weekly taxol x12 cycles. She is active and relatively healthy, she appears to be a good candidate for chemotherapy  -Her baseline echo from 10/15/17 shows grade 1 diastolic dysfunction, EF 60-63%, GLS -21% -She started [facility] every 2 weeks on 10/22/17. She tolerated her first cycle very well with mild bone pain from neulasta.  -I discussed the more she receives chemo she may develop more side effects. She is to contact us if she develops anything unexpected.  -I previously suggested for her to continue Miralax for her constipation.  -  Her BP has been elevated during her visits. She has not checked at home. I advise she reduce salt intake, increase water intake and if BP high in future I will start her on hypertension medication.  -Labs reviewed, WBC and ANC slightly elevated from neulasta otherwise labs adequate to  proceed with [facility] today   -I advised the patient to start using a mask during the first 4-5 days following treatment as that is when she is at a more immunocompromised state.  -Plan to repeat US after cycle 4 AC to evaluate response.  -She is tolerating chemotherapy very well, and actually remains working full-time. -Reviewed, adequate for treatment, will proceed cycle 3 aC today -Routine 2 weeks for cycle 4 AC  2. Social support -She works 60 hours per week at her physically demanding job. I informed her she may need to take time off during her intensive chemotherapy schedule. She plans to apply for medicaid. I referred her to social work today for ongoing f/u.  -She has met with our financial office, she is working on Engineer, mining.   3. Substance use -She has been smoking cigarettes and marijuana for decades. I encouraged her to quit completely. She has been weaning herself off cigarettes; she will continue to try.  -She mentioned interest in herbal and holistic diet, I previously recommended that she avoid herbal supplements while on chemotherapy and endocrine therapy due to potential drug interactions, she understands.  -She now smokes 3 cigarettes a day. I offered her the nicotine patch and chantix to which the patient declines this at this time.  -I again encouraged her to quit completely   4. Insomnia  -She works long hours and has interrupted sleep.  -I previously recommended benadryl and or melatonin OTC first. She agreed.   5. Nutrition, exercise -I discussed with the patient to continue with decreasing sodium intake as well as continue with exercise to aid with elevated blood pressure readings.  -She will continue to monitor her sodium intake to aid with her elevated blood pressure readings.     PLAN:  -Lab reviewed, adequate for treatment, will proceed to cycle 3 AC today -She will return in 2 weeks for cycle 4 AC, she will start weekly Taxol in 4 weeks.    All  questions were answered. The patient knows to call the clinic with any problems, questions or concerns. I spent 20 minutes counseling the patient face to face. The total time spent in the appointment was 25 minutes and more than 50% was on counseling.     [name], MD 11/19/2017   I, Soijett Blue am acting as scribe for Dr. [name].  I have reviewed the above documentation for accuracy and completeness, and I agree with the above.

## 2017-11-19 ENCOUNTER — Inpatient Hospital Stay: Payer: Medicaid Other | Attending: Nurse Practitioner | Admitting: Hematology

## 2017-11-19 ENCOUNTER — Inpatient Hospital Stay: Payer: Medicaid Other

## 2017-11-19 ENCOUNTER — Encounter: Payer: Self-pay | Admitting: Hematology

## 2017-11-19 ENCOUNTER — Encounter: Payer: Self-pay | Admitting: Pharmacy Technician

## 2017-11-19 ENCOUNTER — Other Ambulatory Visit: Payer: Self-pay

## 2017-11-19 VITALS — BP 154/86 | HR 80 | Temp 98.4°F | Resp 18 | Ht 64.0 in | Wt 168.1 lb

## 2017-11-19 DIAGNOSIS — C50811 Malignant neoplasm of overlapping sites of right female breast: Secondary | ICD-10-CM

## 2017-11-19 DIAGNOSIS — Z9011 Acquired absence of right breast and nipple: Secondary | ICD-10-CM | POA: Insufficient documentation

## 2017-11-19 DIAGNOSIS — C50412 Malignant neoplasm of upper-outer quadrant of left female breast: Secondary | ICD-10-CM

## 2017-11-19 DIAGNOSIS — Z79899 Other long term (current) drug therapy: Secondary | ICD-10-CM | POA: Insufficient documentation

## 2017-11-19 DIAGNOSIS — Z170 Estrogen receptor positive status [ER+]: Secondary | ICD-10-CM | POA: Diagnosis not present

## 2017-11-19 DIAGNOSIS — F1290 Cannabis use, unspecified, uncomplicated: Secondary | ICD-10-CM | POA: Diagnosis not present

## 2017-11-19 DIAGNOSIS — Z95828 Presence of other vascular implants and grafts: Secondary | ICD-10-CM

## 2017-11-19 DIAGNOSIS — Z7689 Persons encountering health services in other specified circumstances: Secondary | ICD-10-CM | POA: Diagnosis not present

## 2017-11-19 DIAGNOSIS — Z5111 Encounter for antineoplastic chemotherapy: Secondary | ICD-10-CM | POA: Diagnosis present

## 2017-11-19 LAB — CMP (CANCER CENTER ONLY)
ALT: 12 U/L (ref 0–55)
AST: 13 U/L (ref 5–34)
Albumin: 3.8 g/dL (ref 3.5–5.0)
Alkaline Phosphatase: 104 U/L (ref 40–150)
Anion gap: 9 (ref 3–11)
BUN: 18 mg/dL (ref 7–26)
CHLORIDE: 107 mmol/L (ref 98–109)
CO2: 27 mmol/L (ref 22–29)
CREATININE: 0.75 mg/dL (ref 0.60–1.10)
Calcium: 9.6 mg/dL (ref 8.4–10.4)
Glucose, Bld: 107 mg/dL (ref 70–140)
Potassium: 4 mmol/L (ref 3.5–5.1)
Sodium: 143 mmol/L (ref 136–145)
Total Bilirubin: <0.2 mg/dL — ABNORMAL LOW (ref 0.2–1.2)
Total Protein: 6.4 g/dL (ref 6.4–8.3)

## 2017-11-19 LAB — CBC WITH DIFFERENTIAL (CANCER CENTER ONLY)
BASOS PCT: 1 %
Basophils Absolute: 0.1 10*3/uL (ref 0.0–0.1)
EOS ABS: 0 10*3/uL (ref 0.0–0.5)
Eosinophils Relative: 0 %
HCT: 34.9 % (ref 34.8–46.6)
HEMOGLOBIN: 11.8 g/dL (ref 11.6–15.9)
LYMPHS ABS: 1.9 10*3/uL (ref 0.9–3.3)
Lymphocytes Relative: 17 %
MCH: 30.6 pg (ref 25.1–34.0)
MCHC: 33.8 g/dL (ref 31.5–36.0)
MCV: 90.7 fL (ref 79.5–101.0)
Monocytes Absolute: 0.9 10*3/uL (ref 0.1–0.9)
Monocytes Relative: 8 %
NEUTROS PCT: 74 %
Neutro Abs: 8.5 10*3/uL — ABNORMAL HIGH (ref 1.5–6.5)
Platelet Count: 214 10*3/uL (ref 145–400)
RBC: 3.85 MIL/uL (ref 3.70–5.45)
RDW: 13.7 % (ref 11.2–14.5)
WBC: 11.4 10*3/uL — AB (ref 3.9–10.3)

## 2017-11-19 MED ORDER — PALONOSETRON HCL INJECTION 0.25 MG/5ML
INTRAVENOUS | Status: AC
  Filled 2017-11-19: qty 5

## 2017-11-19 MED ORDER — DOXORUBICIN HCL CHEMO IV INJECTION 2 MG/ML
60.0000 mg/m2 | Freq: Once | INTRAVENOUS | Status: AC
  Administered 2017-11-19: 112 mg via INTRAVENOUS
  Filled 2017-11-19: qty 56

## 2017-11-19 MED ORDER — HEPARIN SOD (PORK) LOCK FLUSH 100 UNIT/ML IV SOLN
500.0000 [IU] | Freq: Once | INTRAVENOUS | Status: AC
  Administered 2017-11-19: 500 [IU]
  Filled 2017-11-19: qty 5

## 2017-11-19 MED ORDER — SODIUM CHLORIDE 0.9 % IV SOLN
Freq: Once | INTRAVENOUS | Status: AC
  Administered 2017-11-19: 12:00:00 via INTRAVENOUS

## 2017-11-19 MED ORDER — SODIUM CHLORIDE 0.9% FLUSH
10.0000 mL | Freq: Once | INTRAVENOUS | Status: AC
  Administered 2017-11-19: 10 mL
  Filled 2017-11-19: qty 10

## 2017-11-19 MED ORDER — PROCHLORPERAZINE MALEATE 10 MG PO TABS
10.0000 mg | ORAL_TABLET | Freq: Four times a day (QID) | ORAL | 1 refills | Status: DC | PRN
Start: 2017-11-19 — End: 2019-03-22

## 2017-11-19 MED ORDER — PALONOSETRON HCL INJECTION 0.25 MG/5ML
0.2500 mg | Freq: Once | INTRAVENOUS | Status: AC
  Administered 2017-11-19: 0.25 mg via INTRAVENOUS

## 2017-11-19 MED ORDER — CYCLOPHOSPHAMIDE CHEMO INJECTION 1 GM
600.0000 mg/m2 | Freq: Once | INTRAMUSCULAR | Status: AC
  Administered 2017-11-19: 1120 mg via INTRAVENOUS
  Filled 2017-11-19: qty 56

## 2017-11-19 MED ORDER — SODIUM CHLORIDE 0.9 % IV SOLN
Freq: Once | INTRAVENOUS | Status: AC
  Administered 2017-11-19: 12:00:00 via INTRAVENOUS
  Filled 2017-11-19: qty 5

## 2017-11-19 NOTE — Patient Instructions (Signed)
[facility] Discharge Instructions for Patients Receiving Chemotherapy  Today you received the following chemotherapy agents: Doxorubicin, Cyclophosphamide  To help prevent nausea and vomiting after your treatment, we encourage you to take your nausea medication as directed.    If you develop nausea and vomiting that is not controlled by your nausea medication, call the clinic.   BELOW ARE SYMPTOMS THAT SHOULD BE REPORTED IMMEDIATELY:  *FEVER GREATER THAN 100.5 F  *CHILLS WITH OR WITHOUT FEVER  NAUSEA AND VOMITING THAT IS NOT CONTROLLED WITH YOUR NAUSEA MEDICATION  *UNUSUAL SHORTNESS OF BREATH  *UNUSUAL BRUISING OR BLEEDING  TENDERNESS IN MOUTH AND THROAT WITH OR WITHOUT PRESENCE OF ULCERS  *URINARY PROBLEMS  *BOWEL PROBLEMS  UNUSUAL RASH Items with * indicate a potential emergency and should be followed up as soon as possible.  Feel free to call the clinic should you have any questions or concerns. The clinic phone number is [phone].  Please show the CHEMO ALERT CARD at check-in to the Emergency Department and triage nurse.    

## 2017-11-19 NOTE — Progress Notes (Signed)
The patient is now covered by Medicaid and is no longer in need of drug assistance. This patient had drug assistance for Emend and Udenyca. I have requested these drugs Emend, DOS 11/06/17 - 11/19/17 and Udenyca, DOS 11/07/17 - 11/21/17. Please do not bill Medicaid for theses drugs and dates

## 2017-11-20 ENCOUNTER — Inpatient Hospital Stay: Payer: Medicaid Other

## 2017-11-20 ENCOUNTER — Telehealth: Payer: Self-pay | Admitting: Hematology

## 2017-11-20 VITALS — BP 148/84 | HR 78 | Temp 97.9°F | Resp 20

## 2017-11-20 DIAGNOSIS — Z170 Estrogen receptor positive status [ER+]: Principal | ICD-10-CM

## 2017-11-20 DIAGNOSIS — Z5111 Encounter for antineoplastic chemotherapy: Secondary | ICD-10-CM | POA: Diagnosis not present

## 2017-11-20 DIAGNOSIS — C50811 Malignant neoplasm of overlapping sites of right female breast: Secondary | ICD-10-CM

## 2017-11-20 MED ORDER — PEGFILGRASTIM-CBQV 6 MG/0.6ML ~~LOC~~ SOSY
6.0000 mg | PREFILLED_SYRINGE | Freq: Once | SUBCUTANEOUS | Status: AC
  Administered 2017-11-20: 6 mg via SUBCUTANEOUS

## 2017-11-20 MED ORDER — PEGFILGRASTIM-CBQV 6 MG/0.6ML ~~LOC~~ SOSY
PREFILLED_SYRINGE | SUBCUTANEOUS | Status: AC
  Filled 2017-11-20: qty 0.6

## 2017-11-20 NOTE — Patient Instructions (Signed)
Pegfilgrastim injection What is this medicine? PEGFILGRASTIM (PEG fil gra stim) is a long-acting granulocyte colony-stimulating factor that stimulates the growth of neutrophils, a type of white blood cell important in the body's fight against infection. It is used to reduce the incidence of fever and infection in patients with certain types of cancer who are receiving chemotherapy that affects the bone marrow, and to increase survival after being exposed to high doses of radiation. This medicine may be used for other purposes; ask your health care provider or pharmacist if you have questions. COMMON BRAND NAME(S): Neulasta What should I tell my health care provider before I take this medicine? They need to know if you have any of these conditions: -kidney disease -latex allergy -ongoing radiation therapy -sickle cell disease -skin reactions to acrylic adhesives (On-Body Injector only) -an unusual or allergic reaction to pegfilgrastim, filgrastim, other medicines, foods, dyes, or preservatives -pregnant or trying to get pregnant -breast-feeding How should I use this medicine? This medicine is for injection under the skin. If you get this medicine at home, you will be taught how to prepare and give the pre-filled syringe or how to use the On-body Injector. Refer to the patient Instructions for Use for detailed instructions. Use exactly as directed. Tell your healthcare provider immediately if you suspect that the On-body Injector may not have performed as intended or if you suspect the use of the On-body Injector resulted in a missed or partial dose. It is important that you put your used needles and syringes in a special sharps container. Do not put them in a trash can. If you do not have a sharps container, call your pharmacist or healthcare provider to get one. Talk to your pediatrician regarding the use of this medicine in children. While this drug may be prescribed for selected conditions,  precautions do apply. Overdosage: If you think you have taken too much of this medicine contact a poison control center or emergency room at once. NOTE: This medicine is only for you. Do not share this medicine with others. What if I miss a dose? It is important not to miss your dose. Call your doctor or health care professional if you miss your dose. If you miss a dose due to an On-body Injector failure or leakage, a new dose should be administered as soon as possible using a single prefilled syringe for manual use. What may interact with this medicine? Interactions have not been studied. Give your health care provider a list of all the medicines, herbs, non-prescription drugs, or dietary supplements you use. Also tell them if you smoke, drink alcohol, or use illegal drugs. Some items may interact with your medicine. This list may not describe all possible interactions. Give your health care provider a list of all the medicines, herbs, non-prescription drugs, or dietary supplements you use. Also tell them if you smoke, drink alcohol, or use illegal drugs. Some items may interact with your medicine. What should I watch for while using this medicine? You may need blood work done while you are taking this medicine. If you are going to need a MRI, CT scan, or other procedure, tell your doctor that you are using this medicine (On-Body Injector only). What side effects may I notice from receiving this medicine? Side effects that you should report to your doctor or health care professional as soon as possible: -allergic reactions like skin rash, itching or hives, swelling of the face, lips, or tongue -dizziness -fever -pain, redness, or irritation at site   where injected -pinpoint red spots on the skin -red or dark-brown urine -shortness of breath or breathing problems -stomach or side pain, or pain at the shoulder -swelling -tiredness -trouble passing urine or change in the amount of urine Side  effects that usually do not require medical attention (report to your doctor or health care professional if they continue or are bothersome): -bone pain -muscle pain This list may not describe all possible side effects. Call your doctor for medical advice about side effects. You may report side effects to FDA at 1-800-FDA-1088. Where should I keep my medicine? Keep out of the reach of children. Store pre-filled syringes in a refrigerator between 2 and 8 degrees C (36 and 46 degrees F). Do not freeze. Keep in carton to protect from light. Throw away this medicine if it is left out of the refrigerator for more than 48 hours. Throw away any unused medicine after the expiration date. NOTE: This sheet is a summary. It may not cover all possible information. If you have questions about this medicine, talk to your doctor, pharmacist, or health care provider.  2018 Elsevier/Gold Standard (2016-05-23 12:58:03)  

## 2017-11-20 NOTE — Telephone Encounter (Signed)
Appointments scheduled patient to pick up new calendar at appointment today per 6/12 los

## 2017-12-02 NOTE — Progress Notes (Signed)
[place]  [phone] [phone]) [phone]  Clinic Follow up Note   Patient Care Team: [name], MD as PCP - General (Family Medicine) [name], MD as Consulting Physician (General Surgery) [name], MD as Consulting Physician (Hematology) 12/03/2017  SUMMARY OF ONCOLOGIC HISTORY: Oncology History   Cancer Staging Cancer of overlapping sites of right female breast [facility]) Staging form: Breast, AJCC 8th Edition - Clinical stage from 09/23/2017: Stage IIIA (cT3, cN1, cM0, G2, ER+, PR-, HER2-) - Signed by [name], MD on 10/09/2017 - Clinical: cT3 - Unsigned       Cancer of overlapping sites of right female breast ([place])   09/09/2017 Mammogram    IMPRESSION: 1. Highly suspicious large right breast mass involving the outer and upper breast extending from approximately 9 o'clock through 12-1 o'clock, maximum measurement approximating 7.5 cm. The mass involves the right nipple, accounting for nipple retraction. Architectural distortion, microcalcifications and diffuse trabecular thickening and skin thickening are associated with the large mass, raising the possibility of this representing an inflammatory cancer. 2. Satellite masses separate from the dominant mass involving the lower outer quadrant and the upper inner quadrant of the right breast, measured above. 3. Two adjacent pathologic right axillary lymph nodes. 4. Indeterminate 0.9 cm solid mass involving the upper outer quadrant of the left breast which accounts for a mammographic finding. 5. No pathologic left axillary lymphadenopathy.      09/09/2017 Breast US    Targeted right breast ultrasound is performed, showing a very large hypoechoic mass with irregular margins extending from the approximate 9 o'clock position through the 12 to 1 o'clock position. On the CC gait image, the mass measures maximally approximately 7.5 cm. The mass has irregular margins, demonstrates acoustic shadowing, and  demonstrates internal power Doppler flow. The mass extends into the retroareolar region and involves the nipple, accounting for the nipple retraction.  Separate from the dominant mass at the 7 o'clock position approximately 4 cm from nipple is a hypoechoic antiparallel mass with irregular margins measuring approximately 1.4 x 2.7 x 1.7 cm.  Separate from the dominant mass at the 1 o'clock position approximately 2 cm from nipple is a hypoechoic mass with irregular margins measuring approximately 1.7 x 0.5 x 1.6 cm. Both of these masses also demonstrate internal power Doppler flow.  Sonographic evaluation of the right axilla demonstrates 2 adjacent pathologic lymph nodes, the larger measuring approximately 2.5 x 2.5 x 2.8 cm, the smaller measuring approximately 2.0 x 0.8 x 2.7 cm.      09/23/2017 Initial Biopsy    Diagnosis 1. Breast, left, needle core biopsy, 2 o'clock - INVASIVE DUCTAL CARCINOMA. - SEE COMMENT. 2. Breast, right, needle core biopsy, 11:30 o'clock - INVASIVE MAMMARY CARCINOMA. - SEE COMMENT. 3. Lymph node, needle/core biopsy, right axilla - METASTATIC MAMMARY CARCINOMA IN 1 OF 1 LYMPH NODE (1/1). Microscopic Comment 1. There is a small focus of grade I invasive ductal carcinoma. A complete breast prognostic profile will be attempted and the results reported separately. The results were called to the [facility] on 09/24/2017. 2. The carcinoma appears grade II. An E-Cadherin stain and a breast prognostic profile will be performed on part 2 and the results reported separately. (JBK:kh 09-24-17) [name]  1. HER2 Negative by FISH Estrogen receptor: 100% positive, strong staining Progesterone receptor: 80% positive, strong staining Ki67: 5%  2. HER2 Negative by FISH Estrogen receptor: 100% positive, strong staining Progesterone receptor: 0%, negative  Ki67: 30% 2. The tumor  cells are strongly positive for E-cadherin, supporting a ductal phenotype       09/23/2017 Cancer Staging    Staging form: Breast, AJCC 8th Edition - Clinical stage from 09/23/2017: Stage IIIA (cT3, cN1, cM0, G2, ER+, PR-, HER2-) - Signed by [name], MD on 10/09/2017      10/07/2017 Breast MRI    IMPRESSION: 1. Biopsy proven malignancy involving all 4 quadrants of the right breast, although predominantly located in the anterior third of the central upper right breast measures 6.8 x 9.6 x 7.2 cm.  2. Five pathologic right axillary lymph nodes, compatible with biopsy proven axillary metastases.  3. Small mass with associated biopsy related changes in the upper-outer left breast at site of known malignancy measures 0.9 x 0.8 x 0.7 cm. No abnormal lymph nodes seen in the left axilla.  RECOMMENDATION: Treatment plan for known bilateral breast malignancy.  BI-RADS CATEGORY  6: Known biopsy-proven malignancy.      10/08/2017 Initial Diagnosis    Cancer of overlapping sites of right female breast ([place])      10/13/2017 Pathology Results    Diagnosis 1. Breast, right, needle core biopsy, 1 o'clock - INVASIVE DUCTAL CARCINOMA. - SEE COMMENT. 2. Breast, right, needle core biopsy, 7 o'clock - INVASIVE DUCTAL CARCINOMA. - SEE COMMENT. Microscopic Comment 1. and 2. The carcinoma in parts 1 and 2 is morphologically similar and appears grade II. Because breast prognostic profiles were performed on the patients previous case, [id number], it will not be repeated on the current case unless requested.       10/17/2017 Imaging    CT CAP IMPRESSION: 1. Infiltrative right breast mass with overlying skin thickening is identified compatible with known breast cancer. 2. Enlarged right axillary lymph nodes compatible with metastatic adenopathy. 3. No additional sites of disease identified within the chest and no evidence for metastasis to the abdomen or pelvis. 4. Aortic atherosclerosis and 3 vessel coronary artery atherosclerotic calcifications. Aortic  Atherosclerosis (ICD10-I70.0).      10/17/2017 Imaging    Bone Scan FINDINGS: There are no foci of increased or decreased radiotracer uptake to suggest osseous metastatic disease. 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 There is degenerative type uptake within both shoulders and the left ankle.  Normal physiologic activity is identified within the kidneys and urinary bladder.  IMPRESSION: No evidence of osseous metastatic disease.      10/22/2017 -  Chemotherapy    AC q2 weeks x4 cycles starting 10/22/17, followed by weekly taxol x12       Malignant neoplasm of left breast in female, estrogen receptor positive ([place])   09/09/2017 Breast US    Targeted left breast ultrasound is performed, showing an oval parallel hypoechoic mass containing microcalcifications at the 2 o'clock position approximately 6 cm from nipple measuring approximately 0.9 x 0.5 x 0.9 cm, corresponding to the mammographic finding.  Sonographic evaluation of the left axilla demonstrates no pathologic lymphadenopathy.      09/23/2017 Initial Biopsy    Diagnosis 1. Breast, left, needle core biopsy, 2 o'clock - INVASIVE DUCTAL CARCINOMA. - SEE COMMENT. 2. Breast, right, needle core biopsy, 11:30 o'clock - INVASIVE MAMMARY CARCINOMA. - SEE COMMENT. 3. Lymph node, needle/core biopsy, right axilla - METASTATIC MAMMARY CARCINOMA IN 1 OF 1 LYMPH NODE (1/1). Microscopic Comment 1. There is a small focus of grade I invasive ductal carcinoma. A complete breast prognostic profile will be attempted and the  results reported separately. The results were called to the [facility] on 09/24/2017. 2. The carcinoma appears grade II. An E-Cadherin stain and a breast prognostic profile will be performed on part 2 and the results reported separately. (JBK:kh 09-24-17) [name]  1. HER2 Negative by FISH Estrogen  receptor: 100% positive, strong staining Progesterone receptor: 80% positive, strong staining Ki67: 5%  2. HER2 Negative by FISH Estrogen receptor: 100% positive, strong staining Progesterone receptor: 0%, negative  Ki67: 30%      10/08/2017 Initial Diagnosis    Malignant neoplasm of left breast in female, estrogen receptor positive ([place])     CURRENT THERAPY : Neoadjuvant chemo AC q2 weeks x4 cycles starting 10/22/17, followed by weekly taxol x12   INTERVAL HISTORY: Ms. Usman returns for follow up and cycle 3 AC as scheduled. She has fatigue on day 3 after treatment. 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 Denies neuropathy or difficulty performing tasks. Takes prophylactic anti-emetics beginning the day of chemo for 4-5 days then not again until next cycle. She denies n/v/d. Takes miralax to maintain regular BM which occasionally causes loose stool. She had 2 small mouth sores on her frontal gumline that are gone now, lasted 2 days; one small ulcer on her inner lower lip is nearly resolved. Not painful, not limiting po intake. Denies fever or chills. Denies new concerns in her breasts.   REVIEW OF SYSTEMS:   Constitutional: Denies fevers, chills or abnormal weight loss (+) Fatigue on day 3 after treatment  Eyes: Denies blurriness of vision Ears, nose, mouth, throat, and face: Denies sore throat (+) mucositis after cycle 3, nontender  Respiratory: Denies cough, dyspnea or wheezes Cardiovascular: Denies palpitation, chest discomfort or lower extremity swelling Gastrointestinal:  Denies nausea, vomiting, constipation, diarrhea, heartburn or change in bowel habits (+) occasional loose BM Skin: Denies abnormal skin rashes Lymphatics: Denies new lymphadenopathy or easy bruising Neurological:Denies numbness, tingling or new weaknesses Behavioral/Psych: Mood is stable, no new changes  All other systems were reviewed with the patient and are negative.  MEDICAL  HISTORY:  Past Medical History:  Diagnosis Date  . Adopted   . Arthritis   . breast ca dx'd 08/2017  . Genetic testing 11/14/2017   Multi-Cancer panel (83 genes) @ [facility] - Pathogenic mutation in the MITF gene  . GERD (gastroesophageal reflux disease)   . 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 IR IMAGING GUIDED PORT INSERTION  10/20/2017  . IR US GUIDE VASC ACCESS LEFT  10/20/2017  . TONSILLECTOMY Bilateral   . TUBAL LIGATION      I have reviewed the social history and family history with the patient and they are unchanged from previous note.  ALLERGIES:  is allergic to bee venom.  MEDICATIONS:  Current Outpatient Medications  Medication Sig Dispense Refill  . co-enzyme Q-10 30 MG capsule Take 30 mg by mouth daily.    [name] lidocaine-prilocaine (EMLA) cream Apply to affected area once 30 g 3  . ondansetron (ZOFRAN) 8 MG tablet Take 1 tablet (8 mg total) by mouth 2 (two) times daily as needed. Start on the third day after chemotherapy. 30 tablet 1  . prochlorperazine (COMPAZINE) 10 MG tablet Take 1 tablet (10 mg total) by mouth every 6 (six) hours as needed (Nausea or vomiting). 60 tablet 1   No current facility-administered medications for  this visit.    Facility-Administered Medications Ordered in Other Visits  Medication Dose Route Frequency Provider Last Rate Last Dose  . cyclophosphamide (CYTOXAN) 1,120 mg in sodium chloride 0.9 % 250 mL chemo infusion  600 mg/m2 (Treatment Plan Recorded) Intravenous Once [name], MD 612 mL/hr at 12/03/17 1305 1,120 mg at 12/03/17 1305  . heparin lock flush 100 unit/mL  500 Units Intracatheter Once PRN [name], MD      . sodium chloride flush (NS) 0.9 % injection 10 mL  10 mL Intracatheter PRN [name], MD        PHYSICAL EXAMINATION: ECOG PERFORMANCE STATUS: 1 - Symptomatic but completely  ambulatory  Vitals:   12/03/17 0956  BP: (!) 156/91  Pulse: 85  Resp: 18  Temp: 98.5 F (36.9 C)  SpO2: 98%   Filed Weights   12/03/17 0956  Weight: 169 lb 14.4 oz (77.1 kg)    GENERAL:alert, no distress and comfortable SKIN: no rashes or significant lesions EYES: normal, Conjunctiva are pink and non-injected, sclera clear OROPHARYNX:no thrush. Tiny non-tender ulceration to lower inferior lip LYMPH:  no palpable cervical or supraclavicular lymphadenopathy  LUNGS: clear to auscultation with normal breathing effort HEART: regular rate & rhythm with murmur; no lower extremity edema ABDOMEN:abdomen soft, non-tender and normal bowel sounds Musculoskeletal:no cyanosis of digits and no clubbing  NEURO: alert & oriented x 3 with fluent speech, no focal motor/sensory deficits BREAST: right axillary lymph node is difficult to palpate, now 0.5 -1 cm with 6.5x6 cm mass in center of right breast which is softer, with skin edema. No ulcers, bleeding, or discharge. No palpable mass in left breast or axilla.  PAC without erythema    LABORATORY DATA:  I have reviewed the data as listed CBC Latest Ref Rng & Units 12/03/2017 11/19/2017 11/06/2017  WBC 3.9 - 10.3 K/uL 13.4(H) 11.4(H) 11.5(H)  Hemoglobin 11.6 - 15.9 g/dL 10.8(L) 11.8 12.2  Hematocrit 34.8 - 46.6 % 31.9(L) 34.9 35.9  Platelets 145 - 400 K/uL 330 214 253     CMP Latest Ref Rng & Units 12/03/2017 11/19/2017 11/06/2017  Glucose 70 - 99 mg/dL 101(H) 107 95  BUN 8 - 23 mg/dL '17 18 23  '$ Creatinine 0.44 - 1.00 mg/dL 0.74 0.75 0.84  Sodium 135 - 145 mmol/L 143 143 142  Potassium 3.5 - 5.1 mmol/L 4.0 4.0 4.1  Chloride 98 - 111 mmol/L 107 107 108  CO2 22 - 32 mmol/L '27 27 26  '$ Calcium 8.9 - 10.3 mg/dL 9.3 9.6 8.7  Total Protein 6.5 - 8.1 g/dL 6.2(L) 6.4 5.9(L)  Total Bilirubin 0.3 - 1.2 mg/dL <0.2(L) <0.2(L) <0.2(L)  Alkaline Phos 38 - 126 U/L 103 104 84  AST 15 - 41 U/L 11(L) 13 11  ALT 0 - 44 U/L '12 12 12    '$ PATHOLOGY   Diagnosis 10/13/17 1. Breast, right, needle core biopsy, 1 o'clock - INVASIVE DUCTAL CARCINOMA. - SEE COMMENT. 2. Breast, right, needle core biopsy, 7 o'clock - INVASIVE DUCTAL CARCINOMA. - SEE COMMENT. Microscopic Comment 1. and 2. The carcinoma in parts 1 and 2 is morphologically similar and appears grade II. Because breast prognostic profiles were performed on the patients previous case, [id number], it will not be repeated on the current case unless requested.     Diagnosis 09/23/17 1. Breast, left, needle core biopsy, 2 o'clock - INVASIVE DUCTAL CARCINOMA. - SEE COMMENT. 2. Breast, right, needle core biopsy, 11:30 o'clock - INVASIVE MAMMARY CARCINOMA. - SEE COMMENT. 3. Lymph node, needle/core  biopsy, right axilla - METASTATIC MAMMARY CARCINOMA IN 1 OF 1 LYMPH NODE (1/1). Microscopic Comment 1. There is a small focus of grade I invasive ductal carcinoma. A complete breast prognostic profile will be attempted and the results reported separately. The results were called to the [place] on 09/24/2017. 2. The carcinoma appears grade II. An E-Cadherin stain and a breast prognostic profile will be performed on part 2 and the results reported separately. (JBK:kh 09-24-17) [name] 1. HER2 Negative by FISH Estrogen receptor: 100% positive, strong staining Progesterone receptor: 80% positive, strong staining Ki67: 5% 2. HER2 Negative by FISH Estrogen receptor: 100% positive, strong staining Progesterone receptor: 0%, negative  Ki67: 30% 2. The tumor cells are strongly positive for E-cadherin, supporting a ductal phenotype      RADIOGRAPHIC STUDIES: I have personally reviewed the radiological images as listed and agreed with the findings in the report. No results found.   ASSESSMENT & PLAN: Layza Summa is a 62 y.o. Caucasian female with history of substance usepresented with right nipple inversion for 3 months.    1.  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 Social support 3. Substance use 4. Insomnia 5. Nutrition, exercise  Ms. Smart appears stable. She completed 3 cycles neoadjuvant AC with Udenyca. She is tolerating treatment very well. She developed mucositis after cycle 3, not painful or limiting po intake. I recommend salt/baking soda mouth rinse up to 4 xdaily PRN. VS and weight stable. BP remains elevated. She still eats high salt diet. I encouraged her to reduce salt intake. Will recheck BP in infusion area. If BP remains elevated at next f/u, will consider starting medication. Labs reviewed, adequate for treatment. CA 27.29 pending. Proceed with cycle 4 AC today, will obtain interim [place] after cycle 4 to evaluate response to chemotherapy. Will f/u in 2 weeks to begin weekly taxol.  PLAN -Labs reviewed, proceed with cycle 4 AC, udenyca injection 6/28  -Interim [place] after cycle 4 to evaluate response to chemotherapy  -Recheck BP in infusion room -Begin water/salt/baking soda mouth rinse 4x daily PRN -Return in 2 weeks for lab, f/u and to begin weekly taxol   All questions were answered. The patient knows to call the clinic with any problems, questions or concerns. No barriers to learning was detected.     Alla Feeling, NP 12/03/17

## 2017-12-03 ENCOUNTER — Inpatient Hospital Stay: Payer: Medicaid Other

## 2017-12-03 ENCOUNTER — Telehealth: Payer: Self-pay | Admitting: Hematology

## 2017-12-03 ENCOUNTER — Encounter: Payer: Self-pay | Admitting: Hematology

## 2017-12-03 ENCOUNTER — Inpatient Hospital Stay (HOSPITAL_BASED_OUTPATIENT_CLINIC_OR_DEPARTMENT_OTHER): Payer: Medicaid Other | Admitting: Nurse Practitioner

## 2017-12-03 ENCOUNTER — Encounter: Payer: Self-pay | Admitting: Nurse Practitioner

## 2017-12-03 VITALS — BP 156/91 | HR 85 | Temp 98.5°F | Resp 18 | Ht 64.0 in | Wt 169.9 lb

## 2017-12-03 DIAGNOSIS — Z79899 Other long term (current) drug therapy: Secondary | ICD-10-CM

## 2017-12-03 DIAGNOSIS — C50811 Malignant neoplasm of overlapping sites of right female breast: Secondary | ICD-10-CM

## 2017-12-03 DIAGNOSIS — Z5111 Encounter for antineoplastic chemotherapy: Secondary | ICD-10-CM | POA: Diagnosis not present

## 2017-12-03 DIAGNOSIS — F1290 Cannabis use, unspecified, uncomplicated: Secondary | ICD-10-CM | POA: Diagnosis not present

## 2017-12-03 DIAGNOSIS — C50412 Malignant neoplasm of upper-outer quadrant of left female breast: Secondary | ICD-10-CM

## 2017-12-03 DIAGNOSIS — Z170 Estrogen receptor positive status [ER+]: Principal | ICD-10-CM

## 2017-12-03 DIAGNOSIS — Z9011 Acquired absence of right breast and nipple: Secondary | ICD-10-CM

## 2017-12-03 DIAGNOSIS — Z95828 Presence of other vascular implants and grafts: Secondary | ICD-10-CM

## 2017-12-03 LAB — CMP (CANCER CENTER ONLY)
ALBUMIN: 3.6 g/dL (ref 3.5–5.0)
ALT: 12 U/L (ref 0–44)
ANION GAP: 9 (ref 5–15)
AST: 11 U/L — AB (ref 15–41)
Alkaline Phosphatase: 103 U/L (ref 38–126)
BUN: 17 mg/dL (ref 8–23)
CO2: 27 mmol/L (ref 22–32)
Calcium: 9.3 mg/dL (ref 8.9–10.3)
Chloride: 107 mmol/L (ref 98–111)
Creatinine: 0.74 mg/dL (ref 0.44–1.00)
GFR, Est AFR Am: >60 mL/min (ref >60)
GFR, Estimated: >60 mL/min (ref >60)
GLUCOSE: 101 mg/dL — AB (ref 70–99)
Potassium: 4 mmol/L (ref 3.5–5.1)
SODIUM: 143 mmol/L (ref 135–145)
Total Bilirubin: <0.2 mg/dL — ABNORMAL LOW (ref 0.3–1.2)
Total Protein: 6.2 g/dL — ABNORMAL LOW (ref 6.5–8.1)

## 2017-12-03 LAB — CBC WITH DIFFERENTIAL (CANCER CENTER ONLY)
Basophils Absolute: 0.1 10*3/uL (ref 0.0–0.1)
Basophils Relative: 1 %
EOS PCT: 0 %
Eosinophils Absolute: 0 10*3/uL (ref 0.0–0.5)
HEMATOCRIT: 31.9 % — AB (ref 34.8–46.6)
Hemoglobin: 10.8 g/dL — ABNORMAL LOW (ref 11.6–15.9)
Lymphocytes Relative: 13 %
Lymphs Abs: 1.8 10*3/uL (ref 0.9–3.3)
MCH: 30.8 pg (ref 25.1–34.0)
MCHC: 33.9 g/dL (ref 31.5–36.0)
MCV: 90.6 fL (ref 79.5–101.0)
MONO ABS: 1.1 10*3/uL — AB (ref 0.1–0.9)
MONOS PCT: 8 %
NEUTROS ABS: 10.4 10*3/uL — AB (ref 1.5–6.5)
Neutrophils Relative %: 78 %
PLATELETS: 330 10*3/uL (ref 145–400)
RBC: 3.52 MIL/uL — ABNORMAL LOW (ref 3.70–5.45)
RDW: 14.5 % (ref 11.2–14.5)
WBC Count: 13.4 10*3/uL — ABNORMAL HIGH (ref 3.9–10.3)

## 2017-12-03 MED ORDER — PALONOSETRON HCL INJECTION 0.25 MG/5ML
INTRAVENOUS | Status: AC
  Filled 2017-12-03: qty 5

## 2017-12-03 MED ORDER — SODIUM CHLORIDE 0.9 % IV SOLN
Freq: Once | INTRAVENOUS | Status: AC
  Administered 2017-12-03: 12:00:00 via INTRAVENOUS
  Filled 2017-12-03: qty 5

## 2017-12-03 MED ORDER — SODIUM CHLORIDE 0.9% FLUSH
10.0000 mL | Freq: Once | INTRAVENOUS | Status: AC
  Administered 2017-12-03: 10 mL
  Filled 2017-12-03: qty 10

## 2017-12-03 MED ORDER — SODIUM CHLORIDE 0.9 % IV SOLN
600.0000 mg/m2 | Freq: Once | INTRAVENOUS | Status: AC
  Administered 2017-12-03: 1120 mg via INTRAVENOUS
  Filled 2017-12-03: qty 56

## 2017-12-03 MED ORDER — HEPARIN SOD (PORK) LOCK FLUSH 100 UNIT/ML IV SOLN
500.0000 [IU] | Freq: Once | INTRAVENOUS | Status: AC | PRN
  Administered 2017-12-03: 500 [IU]
  Filled 2017-12-03: qty 5

## 2017-12-03 MED ORDER — SODIUM CHLORIDE 0.9 % IV SOLN
Freq: Once | INTRAVENOUS | Status: AC
  Administered 2017-12-03: 12:00:00 via INTRAVENOUS

## 2017-12-03 MED ORDER — DOXORUBICIN HCL CHEMO IV INJECTION 2 MG/ML
60.0000 mg/m2 | Freq: Once | INTRAVENOUS | Status: AC
Start: 2017-12-03 — End: 2017-12-03
  Administered 2017-12-03: 112 mg via INTRAVENOUS
  Filled 2017-12-03: qty 56

## 2017-12-03 MED ORDER — SODIUM CHLORIDE 0.9% FLUSH
10.0000 mL | INTRAVENOUS | Status: DC | PRN
  Administered 2017-12-03: 10 mL
  Filled 2017-12-03: qty 10

## 2017-12-03 MED ORDER — PALONOSETRON HCL INJECTION 0.25 MG/5ML
0.2500 mg | Freq: Once | INTRAVENOUS | Status: AC
  Administered 2017-12-03: 0.25 mg via INTRAVENOUS

## 2017-12-03 NOTE — Patient Instructions (Signed)
[facility] Discharge Instructions for Patients Receiving Chemotherapy  Today you received the following chemotherapy agents: Doxorubicin, Cyclophosphamide  To help prevent nausea and vomiting after your treatment, we encourage you to take your nausea medication as directed.    If you develop nausea and vomiting that is not controlled by your nausea medication, call the clinic.   BELOW ARE SYMPTOMS THAT SHOULD BE REPORTED IMMEDIATELY:  *FEVER GREATER THAN 100.5 F  *CHILLS WITH OR WITHOUT FEVER  NAUSEA AND VOMITING THAT IS NOT CONTROLLED WITH YOUR NAUSEA MEDICATION  *UNUSUAL SHORTNESS OF BREATH  *UNUSUAL BRUISING OR BLEEDING  TENDERNESS IN MOUTH AND THROAT WITH OR WITHOUT PRESENCE OF ULCERS  *URINARY PROBLEMS  *BOWEL PROBLEMS  UNUSUAL RASH Items with * indicate a potential emergency and should be followed up as soon as possible.  Feel free to call the clinic should you have any questions or concerns. The clinic phone number is [phone].  Please show the CHEMO ALERT CARD at check-in to the Emergency Department and triage nurse.    

## 2017-12-03 NOTE — Telephone Encounter (Signed)
Injection appointment added to scheduled per 6/24 sch msg. Patient will get new schedule at todays treatment

## 2017-12-03 NOTE — Progress Notes (Signed)
Stepped away from my desk and patient came by.  Called patient to remind of the available [employer] funds for personal bills and gas cards. She verbalized understanding and thanked me for the call.

## 2017-12-04 ENCOUNTER — Telehealth: Payer: Self-pay | Admitting: Nurse Practitioner

## 2017-12-04 LAB — CANCER ANTIGEN 27.29: CA 27.29: 61.4 U/mL — ABNORMAL HIGH (ref 0.0–38.6)

## 2017-12-04 NOTE — Telephone Encounter (Signed)
No los 6/26  °

## 2017-12-05 ENCOUNTER — Inpatient Hospital Stay: Payer: Medicaid Other

## 2017-12-05 VITALS — BP 155/85 | HR 76 | Temp 98.1°F | Resp 18

## 2017-12-05 DIAGNOSIS — C50811 Malignant neoplasm of overlapping sites of right female breast: Secondary | ICD-10-CM

## 2017-12-05 DIAGNOSIS — Z170 Estrogen receptor positive status [ER+]: Principal | ICD-10-CM

## 2017-12-05 DIAGNOSIS — Z5111 Encounter for antineoplastic chemotherapy: Secondary | ICD-10-CM | POA: Diagnosis not present

## 2017-12-05 MED ORDER — PEGFILGRASTIM-CBQV 6 MG/0.6ML ~~LOC~~ SOSY
PREFILLED_SYRINGE | SUBCUTANEOUS | Status: AC
  Filled 2017-12-05: qty 0.6

## 2017-12-05 MED ORDER — PEGFILGRASTIM-CBQV 6 MG/0.6ML ~~LOC~~ SOSY
6.0000 mg | PREFILLED_SYRINGE | Freq: Once | SUBCUTANEOUS | Status: AC
  Administered 2017-12-05: 6 mg via SUBCUTANEOUS

## 2017-12-16 ENCOUNTER — Other Ambulatory Visit: Payer: Self-pay | Admitting: Nurse Practitioner

## 2017-12-16 ENCOUNTER — Ambulatory Visit
Admission: RE | Admit: 2017-12-16 | Discharge: 2017-12-16 | Disposition: A | Discharge status: Home / Self Care | Payer: Medicaid Other | Source: Ambulatory Visit | Attending: Nurse Practitioner | Admitting: Nurse Practitioner

## 2017-12-16 DIAGNOSIS — C50412 Malignant neoplasm of upper-outer quadrant of left female breast: Secondary | ICD-10-CM

## 2017-12-16 DIAGNOSIS — Z170 Estrogen receptor positive status [ER+]: Principal | ICD-10-CM

## 2017-12-16 DIAGNOSIS — C50811 Malignant neoplasm of overlapping sites of right female breast: Secondary | ICD-10-CM

## 2017-12-17 ENCOUNTER — Encounter: Payer: Self-pay | Admitting: Nurse Practitioner

## 2017-12-17 ENCOUNTER — Inpatient Hospital Stay: Payer: Medicaid Other | Attending: Nurse Practitioner

## 2017-12-17 ENCOUNTER — Inpatient Hospital Stay: Payer: Medicaid Other

## 2017-12-17 ENCOUNTER — Inpatient Hospital Stay (HOSPITAL_BASED_OUTPATIENT_CLINIC_OR_DEPARTMENT_OTHER): Payer: Medicaid Other | Admitting: Nurse Practitioner

## 2017-12-17 VITALS — BP 156/100 | HR 76 | Temp 98.4°F | Resp 17

## 2017-12-17 VITALS — BP 147/87 | HR 87 | Temp 98.1°F | Resp 18 | Ht 64.0 in | Wt 168.0 lb

## 2017-12-17 DIAGNOSIS — R030 Elevated blood-pressure reading, without diagnosis of hypertension: Secondary | ICD-10-CM

## 2017-12-17 DIAGNOSIS — C773 Secondary and unspecified malignant neoplasm of axilla and upper limb lymph nodes: Secondary | ICD-10-CM

## 2017-12-17 DIAGNOSIS — R21 Rash and other nonspecific skin eruption: Secondary | ICD-10-CM | POA: Insufficient documentation

## 2017-12-17 DIAGNOSIS — Z170 Estrogen receptor positive status [ER+]: Secondary | ICD-10-CM

## 2017-12-17 DIAGNOSIS — Z5111 Encounter for antineoplastic chemotherapy: Secondary | ICD-10-CM | POA: Insufficient documentation

## 2017-12-17 DIAGNOSIS — C50811 Malignant neoplasm of overlapping sites of right female breast: Secondary | ICD-10-CM

## 2017-12-17 DIAGNOSIS — C50412 Malignant neoplasm of upper-outer quadrant of left female breast: Secondary | ICD-10-CM | POA: Insufficient documentation

## 2017-12-17 DIAGNOSIS — G620 Drug-induced polyneuropathy: Secondary | ICD-10-CM | POA: Insufficient documentation

## 2017-12-17 DIAGNOSIS — Z95828 Presence of other vascular implants and grafts: Secondary | ICD-10-CM

## 2017-12-17 LAB — CBC WITH DIFFERENTIAL (CANCER CENTER ONLY)
BASOS ABS: 0.1 10*3/uL (ref 0.0–0.1)
Basophils Relative: 1 %
EOS PCT: 0 %
Eosinophils Absolute: 0.1 10*3/uL (ref 0.0–0.5)
HEMATOCRIT: 30.4 % — AB (ref 34.8–46.6)
Hemoglobin: 10 g/dL — ABNORMAL LOW (ref 11.6–15.9)
LYMPHS PCT: 10 %
Lymphs Abs: 1.8 10*3/uL (ref 0.9–3.3)
MCH: 30.9 pg (ref 25.1–34.0)
MCHC: 32.9 g/dL (ref 31.5–36.0)
MCV: 93.8 fL (ref 79.5–101.0)
MONOS PCT: 6 %
Monocytes Absolute: 1.2 10*3/uL — ABNORMAL HIGH (ref 0.1–0.9)
Neutro Abs: 15.7 10*3/uL — ABNORMAL HIGH (ref 1.5–6.5)
Neutrophils Relative %: 83 %
Platelet Count: 208 10*3/uL (ref 145–400)
RBC: 3.24 MIL/uL — ABNORMAL LOW (ref 3.70–5.45)
RDW: 18.3 % — AB (ref 11.2–14.5)
WBC Count: 18.8 10*3/uL — ABNORMAL HIGH (ref 3.9–10.3)
nRBC: 1 /100 WBC — ABNORMAL HIGH

## 2017-12-17 LAB — CMP (CANCER CENTER ONLY)
ALK PHOS: 113 U/L (ref 38–126)
ALT: 13 U/L (ref 0–44)
AST: 10 U/L — AB (ref 15–41)
Albumin: 3.7 g/dL (ref 3.5–5.0)
Anion gap: 8 (ref 5–15)
BUN: 18 mg/dL (ref 8–23)
CALCIUM: 9.3 mg/dL (ref 8.9–10.3)
CO2: 28 mmol/L (ref 22–32)
Chloride: 107 mmol/L (ref 98–111)
Creatinine: 0.79 mg/dL (ref 0.44–1.00)
GFR, Est AFR Am: >60 mL/min (ref >60)
GFR, Estimated: >60 mL/min (ref >60)
GLUCOSE: 119 mg/dL — AB (ref 70–99)
Potassium: 3.5 mmol/L (ref 3.5–5.1)
Sodium: 143 mmol/L (ref 135–145)
TOTAL PROTEIN: 6.2 g/dL — AB (ref 6.5–8.1)

## 2017-12-17 MED ORDER — FAMOTIDINE IN NACL 20-0.9 MG/50ML-% IV SOLN
INTRAVENOUS | Status: AC
  Filled 2017-12-17: qty 50

## 2017-12-17 MED ORDER — DIPHENHYDRAMINE HCL 50 MG/ML IJ SOLN
INTRAMUSCULAR | Status: AC
  Filled 2017-12-17: qty 1

## 2017-12-17 MED ORDER — SODIUM CHLORIDE 0.9 % IV SOLN
20.0000 mg | Freq: Once | INTRAVENOUS | Status: AC
Start: 2017-12-17 — End: 2017-12-17
  Administered 2017-12-17: 20 mg via INTRAVENOUS
  Filled 2017-12-17: qty 2

## 2017-12-17 MED ORDER — SODIUM CHLORIDE 0.9 % IV SOLN
Freq: Once | INTRAVENOUS | Status: AC
  Administered 2017-12-17: 11:00:00 via INTRAVENOUS

## 2017-12-17 MED ORDER — SODIUM CHLORIDE 0.9% FLUSH
10.0000 mL | INTRAVENOUS | Status: DC | PRN
  Administered 2017-12-17: 10 mL
  Filled 2017-12-17: qty 10

## 2017-12-17 MED ORDER — SODIUM CHLORIDE 0.9% FLUSH
10.0000 mL | Freq: Once | INTRAVENOUS | Status: AC
  Administered 2017-12-17: 10 mL
  Filled 2017-12-17: qty 10

## 2017-12-17 MED ORDER — FAMOTIDINE IN NACL 20-0.9 MG/50ML-% IV SOLN
20.0000 mg | Freq: Once | INTRAVENOUS | Status: AC
  Administered 2017-12-17: 20 mg via INTRAVENOUS

## 2017-12-17 MED ORDER — DIPHENHYDRAMINE HCL 50 MG/ML IJ SOLN
50.0000 mg | Freq: Once | INTRAMUSCULAR | Status: AC
  Administered 2017-12-17: 50 mg via INTRAVENOUS

## 2017-12-17 MED ORDER — PACLITAXEL CHEMO INJECTION 300 MG/50ML
80.0000 mg/m2 | Freq: Once | INTRAVENOUS | Status: AC
  Administered 2017-12-17: 150 mg via INTRAVENOUS
  Filled 2017-12-17: qty 25

## 2017-12-17 MED ORDER — HEPARIN SOD (PORK) LOCK FLUSH 100 UNIT/ML IV SOLN
500.0000 [IU] | Freq: Once | INTRAVENOUS | Status: AC | PRN
  Administered 2017-12-17: 500 [IU]
  Filled 2017-12-17: qty 5

## 2017-12-17 NOTE — Patient Instructions (Signed)
[facility] Discharge Instructions for Patients Receiving Chemotherapy  Today you received the following chemotherapy agents Paclitaxel  To help prevent nausea and vomiting after your treatment, we encourage you to take your nausea medication as directed  If you develop nausea and vomiting that is not controlled by your nausea medication, call the clinic.   BELOW ARE SYMPTOMS THAT SHOULD BE REPORTED IMMEDIATELY:  *FEVER GREATER THAN 100.5 F  *CHILLS WITH OR WITHOUT FEVER  NAUSEA AND VOMITING THAT IS NOT CONTROLLED WITH YOUR NAUSEA MEDICATION  *UNUSUAL SHORTNESS OF BREATH  *UNUSUAL BRUISING OR BLEEDING  TENDERNESS IN MOUTH AND THROAT WITH OR WITHOUT PRESENCE OF ULCERS  *URINARY PROBLEMS  *BOWEL PROBLEMS  UNUSUAL RASH Items with * indicate a potential emergency and should be followed up as soon as possible.  Feel free to call the clinic should you have any questions or concerns. The clinic phone number is [phone].  Please show the [facility] at check-in to the Emergency Department and triage nurse.   Paclitaxel injection What is this medicine? PACLITAXEL (PAK li TAX el) is a chemotherapy drug. It targets fast dividing cells, like cancer cells, and causes these cells to die. This medicine is used to treat ovarian cancer, breast cancer, and other cancers. This medicine may be used for other purposes; ask your health care provider or pharmacist if you have questions. COMMON BRAND NAME(S): Onxol, Taxol What should I tell my health care provider before I take this medicine? They need to know if you have any of these conditions: -blood disorders -irregular heartbeat -infection (especially a virus infection such as chickenpox, cold sores, or herpes) -liver disease -previous or ongoing radiation therapy -an unusual or allergic reaction to paclitaxel, alcohol, polyoxyethylated castor oil, other chemotherapy agents, other medicines, foods, dyes, or  preservatives -pregnant or trying to get pregnant -breast-feeding How should I use this medicine? This drug is given as an infusion into a vein. It is administered in a hospital or clinic by a specially trained health care professional. Talk to your pediatrician regarding the use of this medicine in children. Special care may be needed. Overdosage: If you think you have taken too much of this medicine contact a poison control center or emergency room at once. NOTE: This medicine is only for you. Do not share this medicine with others. What if I miss a dose? It is important not to miss your dose. Call your doctor or health care professional if you are unable to keep an appointment. What may interact with this medicine? Do not take this medicine with any of the following medications: -disulfiram -metronidazole This medicine may also interact with the following medications: -cyclosporine -diazepam -ketoconazole -medicines to increase blood counts like filgrastim, pegfilgrastim, sargramostim -other chemotherapy drugs like cisplatin, doxorubicin, epirubicin, etoposide, teniposide, vincristine -quinidine -testosterone -vaccines -verapamil Talk to your doctor or health care professional before taking any of these medicines: -acetaminophen -aspirin -ibuprofen -ketoprofen -naproxen This list may not describe all possible interactions. Give your health care provider a list of all the medicines, herbs, non-prescription drugs, or dietary supplements you use. Also tell them if you smoke, drink alcohol, or use illegal drugs. Some items may interact with your medicine. What should I watch for while using this medicine? Your condition will be monitored carefully while you are receiving this medicine. You will need important blood work done while you are taking this medicine. This medicine can cause serious allergic reactions. To reduce your risk you will need to take  other medicine(s) before  treatment with this medicine. If you experience allergic reactions like skin rash, itching or hives, swelling of the face, lips, or tongue, tell your doctor or health care professional right away. In some cases, you may be given additional medicines to help with side effects. Follow all directions for their use. This drug may make you feel generally unwell. This is not uncommon, as chemotherapy can affect healthy cells as well as cancer cells. Report any side effects. Continue your course of treatment even though you feel ill unless your doctor tells you to stop. Call your doctor or health care professional for advice if you get a fever, chills or sore throat, or other symptoms of a cold or flu. Do not treat yourself. This drug decreases your body's ability to fight infections. Try to avoid being around people who are sick. This medicine may increase your risk to bruise or bleed. Call your doctor or health care professional if you notice any unusual bleeding. Be careful brushing and flossing your teeth or using a toothpick because you may get an infection or bleed more easily. If you have any dental work done, tell your dentist you are receiving this medicine. Avoid taking products that contain aspirin, acetaminophen, ibuprofen, naproxen, or ketoprofen unless instructed by your doctor. These medicines may hide a fever. Do not become pregnant while taking this medicine. Women should inform their doctor if they wish to become pregnant or think they might be pregnant. There is a potential for serious side effects to an unborn child. Talk to your health care professional or pharmacist for more information. Do not breast-feed an infant while taking this medicine. Men are advised not to father a child while receiving this medicine. This product may contain alcohol. Ask your pharmacist or healthcare provider if this medicine contains alcohol. Be sure to tell all healthcare providers you are taking this medicine.  Certain medicines, like metronidazole and disulfiram, can cause an unpleasant reaction when taken with alcohol. The reaction includes flushing, headache, nausea, vomiting, sweating, and increased thirst. The reaction can last from 30 minutes to several hours. What side effects may I notice from receiving this medicine? Side effects that you should report to your doctor or health care professional as soon as possible: -allergic reactions like skin rash, itching or hives, swelling of the face, lips, or tongue -low blood counts - This drug may decrease the number of white blood cells, red blood cells and platelets. You may be at increased risk for infections and bleeding. -signs of infection - fever or chills, cough, sore throat, pain or difficulty passing urine -signs of decreased platelets or bleeding - bruising, pinpoint red spots on the skin, black, tarry stools, nosebleeds -signs of decreased red blood cells - unusually weak or tired, fainting spells, lightheadedness -breathing problems -chest pain -high or low blood pressure -mouth sores -nausea and vomiting -pain, swelling, redness or irritation at the injection site -pain, tingling, numbness in the hands or feet -slow or irregular heartbeat -swelling of the ankle, feet, hands Side effects that usually do not require medical attention (report to your doctor or health care professional if they continue or are bothersome): -bone pain -complete hair loss including hair on your head, underarms, pubic hair, eyebrows, and eyelashes -changes in the color of fingernails -diarrhea -loosening of the fingernails -loss of appetite -muscle or joint pain -red flush to skin -sweating This list may not describe all possible side effects. Call your doctor for medical advice about side   advice about side effects. You may report side effects to FDA at 1-800-FDA-1088. Where should I keep my medicine? This drug is given in a hospital or clinic and will not be stored at  home. NOTE: This sheet is a summary. It may not cover all possible information. If you have questions about this medicine, talk to your doctor, pharmacist, or health care provider.  2018 [employer] (2015-03-28 19:58:00)   

## 2017-12-17 NOTE — Progress Notes (Addendum)
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 Highly suspicious large right breast mass involving the outer and upper breast extending from approximately 9 o'clock through 12-1 o'clock, maximum measurement approximating 7.5 cm. The mass involves the right nipple, accounting for nipple retraction. Architectural distortion, microcalcifications and diffuse trabecular thickening and skin thickening are associated with the large mass, raising the possibility of this representing an inflammatory cancer. 2. Satellite masses separate from the dominant mass involving the lower outer quadrant and the upper inner quadrant of the right breast, measured above. 3. Two adjacent pathologic right axillary lymph nodes. 4. Indeterminate 0.9 cm solid mass involving the upper outer quadrant of the left breast which accounts for a mammographic finding. 5. No pathologic left axillary lymphadenopathy.      09/09/2017 Breast US    Targeted right breast ultrasound is performed, showing a very large hypoechoic mass with irregular margins extending from the approximate 9 o'clock position through the 12 to 1 o'clock position. On the CC gait image, the mass measures maximally approximately 7.5 cm. The mass has irregular margins, demonstrates acoustic shadowing, and  demonstrates internal power Doppler flow. The mass extends into the retroareolar region and involves the nipple, accounting for the nipple retraction.  Separate from the dominant mass at the 7 o'clock position approximately 4 cm from nipple is a hypoechoic antiparallel mass with irregular margins measuring approximately 1.4 x 2.7 x 1.7 cm.  Separate from the dominant mass at the 1 o'clock position approximately 2 cm from nipple is a hypoechoic mass with irregular margins measuring approximately 1.7 x 0.5 x 1.6 cm. Both of these masses also demonstrate internal power Doppler flow.  Sonographic evaluation of the right axilla demonstrates 2 adjacent pathologic lymph nodes, the larger measuring approximately 2.5 x 2.5 x 2.8 cm, the smaller measuring approximately 2.0 x 0.8 x 2.7 cm.      09/23/2017 Initial Biopsy    Diagnosis 1. Breast, left, needle core biopsy, 2 o'clock - INVASIVE DUCTAL CARCINOMA. - SEE COMMENT. 2. Breast, right, needle core biopsy, 11:30 o'clock - INVASIVE MAMMARY CARCINOMA. - SEE COMMENT. 3. Lymph node, needle/core biopsy, right axilla - METASTATIC MAMMARY CARCINOMA IN 1 OF 1 LYMPH NODE (1/1). Microscopic Comment 1. There is a small focus of grade I invasive ductal carcinoma. A complete breast prognostic profile will be attempted and the results reported separately. The results were called to the [place] on 09/24/2017. 2. The carcinoma appears grade II. An E-Cadherin stain and a breast prognostic profile will be performed on part 2 and the results reported separately. (JBK:kh 09-24-17) [name]  1. HER2 Negative by FISH Estrogen receptor: 100% positive, strong staining Progesterone receptor: 80% positive, strong staining Ki67: 5%  2. HER2 Negative by FISH Estrogen receptor: 100% positive, strong staining Progesterone receptor: 0%, negative  Ki67: 30% 2. The tumor  cells are strongly positive for E-cadherin, supporting a ductal phenotype       09/23/2017 Cancer Staging    Staging form: Breast, AJCC 8th Edition - Clinical stage from 09/23/2017: Stage IIIA (cT3, cN1, cM0, G2, ER+, PR-, HER2-) - Signed by [name], MD on 10/09/2017      10/07/2017 Breast MRI    IMPRESSION: 1. Biopsy proven malignancy involving all 4 quadrants of the right breast, although predominantly located in the anterior third of the central upper right breast measures 6.8 x 9.6 x 7.2 cm.  2. Five pathologic right axillary lymph nodes, compatible with biopsy proven axillary metastases.  3. Small mass with associated biopsy related changes in the upper-outer left breast at site of known malignancy measures 0.9 x 0.8 x 0.7 cm. No abnormal lymph nodes seen in the left axilla.  RECOMMENDATION: Treatment plan for known bilateral breast malignancy.  BI-RADS CATEGORY  6: Known biopsy-proven malignancy.      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 Breast, right, needle core biopsy, 1 o'clock - INVASIVE DUCTAL CARCINOMA. - SEE COMMENT. 2. Breast, right, needle core biopsy, 7 o'clock - INVASIVE DUCTAL CARCINOMA. - SEE COMMENT. Microscopic Comment 1. and 2. The carcinoma in parts 1 and 2 is morphologically similar and appears grade II. Because breast prognostic profiles were performed on the patients previous case, [id number], it will not be repeated on the current case unless requested.       10/17/2017 Imaging    CT CAP IMPRESSION: 1. Infiltrative right breast mass with overlying skin thickening is identified compatible with known breast cancer. 2. Enlarged right axillary lymph nodes compatible with metastatic adenopathy. 3. No additional sites of disease identified within the chest and no evidence for metastasis to the abdomen or pelvis. 4. Aortic atherosclerosis and 3 vessel coronary artery atherosclerotic calcifications. Aortic  Atherosclerosis (ICD10-I70.0).      10/17/2017 Imaging    Bone Scan FINDINGS: There are no foci of increased or decreased radiotracer uptake to suggest osseous metastatic disease. 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 There is degenerative type uptake within both shoulders and the left ankle.  Normal physiologic activity is identified within the kidneys and urinary bladder.  IMPRESSION: No evidence of osseous metastatic disease.      10/22/2017 -  Chemotherapy    AC q2 weeks x4 cycles starting 10/22/17, followed by weekly taxol x12      12/16/2017 Breast US    IMPRESSION: 1. Stable to minimal decrease in size of multiple irregular masses throughout the right breast consistent with the patient's sites of biopsy proven malignancy. While the mammographic appearance is slightly less dense in this region, there is increased skin and trabecular thickening involving the entire right breast. 2. Stable appearance of morphologically abnormal right axillary lymph nodes, consistent with biopsy proven metastatic disease. 3. Stable appearance of a left breast mass, consistent with biopsy proven malignancy.        Malignant neoplasm of left breast in female, estrogen receptor positive ([place])   09/09/2017 Breast US    Targeted left breast ultrasound is performed, showing an oval parallel hypoechoic mass containing microcalcifications at the 2 o'clock position approximately 6 cm from nipple measuring approximately 0.9 x 0.5 x 0.9 cm, corresponding to the mammographic finding.  Sonographic evaluation of the left  axilla demonstrates no pathologic lymphadenopathy.      09/23/2017 Initial Biopsy    Diagnosis 1. Breast, left, needle core biopsy, 2 o'clock - INVASIVE DUCTAL CARCINOMA. - SEE COMMENT. 2. Breast, right, needle core biopsy, 11:30 o'clock - INVASIVE MAMMARY CARCINOMA. - SEE COMMENT. 3.  Lymph node, needle/core biopsy, right axilla - METASTATIC MAMMARY CARCINOMA IN 1 OF 1 LYMPH NODE (1/1). Microscopic Comment 1. There is a small focus of grade I invasive ductal carcinoma. A complete breast prognostic profile will be attempted and the results reported separately. The results were called to the [facility] on 09/24/2017. 2. The carcinoma appears grade II. An E-Cadherin stain and a breast prognostic profile will be performed on part 2 and the results reported separately. (JBK:kh 09-24-17) [name]  1. HER2 Negative by FISH Estrogen receptor: 100% positive, strong staining Progesterone receptor: 80% positive, strong staining Ki67: 5%  2. HER2 Negative by FISH Estrogen receptor: 100% positive, strong staining Progesterone receptor: 0%, negative  Ki67: 30%      10/08/2017 Initial Diagnosis    Malignant neoplasm of left breast in female, estrogen receptor positive ([place])      12/16/2017 Breast US    IMPRESSION: 3. Stable appearance of a left breast mass, consistent with biopsy proven malignancy.      CURRENT THERAPY : Neoadjuvant chemo AC q2 weeks x4 cycles starting 10/22/17, followed by weekly taxol x12   INTERVAL HISTORY: Ms. Baldonado returns for follow-up as scheduled prior to cycle 1 weekly Taxol.  He completed cycle 4 AC with Udenyca on 6/26. She has a few instances of mild bone pain with injection. She is tired today, mildly anxious to begin new treatment.  She has been working daily on her feet.  There are some days she is fatigued and has to leave work early, she has not taken much time off. Appetite is normal, taste is decreased; denies mucositis.  Denies nausea or vomiting.  Takes MiraLAX every other day to maintain normal BM.  Breast pain is resolved.  Denies fever, chills, or neuropathy. She notes her eyes water with clear nasal drainage "constantly" since starting chemotherapy.   REVIEW OF SYSTEMS:   Constitutional: Denies fevers, chills or abnormal  weight loss (+) tiredness  Eyes: Denies blurriness of vision (+) watery eyes  Ears, nose, mouth, throat, and face: Denies mucositis or sore throat (+) clear rhinorrhea  Respiratory: Denies cough, dyspnea or wheezes Cardiovascular: Denies palpitation, chest discomfort or lower extremity swelling Gastrointestinal:  Denies nausea, vomiting, diarrhea, heartburn or change in bowel habits (+) regular BM with miralax QOD  Skin: Denies abnormal skin rashes Lymphatics: Denies new lymphadenopathy or easy bruising Neurological:Denies numbness, tingling or new weaknesses Behavioral/Psych: Mood is stable, no new changes  MSK: (+) mild bone pain with neulasta  All other systems were reviewed with the patient and are negative.  MEDICAL HISTORY:  Past Medical History:  Diagnosis Date  . Adopted   . Arthritis   . breast ca dx'd 08/2017  . Genetic testing 11/14/2017   Multi-Cancer panel (83 genes) @ [facility] - Pathogenic mutation in the MITF gene  . GERD (gastroesophageal reflux disease)   . 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 IR IMAGING GUIDED PORT INSERTION  10/20/2017  . IR US GUIDE VASC ACCESS LEFT  10/20/2017  . TONSILLECTOMY Bilateral   . TUBAL LIGATION  I have reviewed the social history and family history with the patient and they are unchanged from previous note.  ALLERGIES:  is allergic to bee venom.  MEDICATIONS:  Current Outpatient Medications  Medication Sig Dispense Refill  . co-enzyme Q-10 30 MG capsule Take 100 mg by mouth daily.     [name] lidocaine-prilocaine (EMLA) cream Apply to affected area once 30 g 3  . ondansetron (ZOFRAN) 8 MG tablet Take 1 tablet (8 mg total) by mouth 2 (two) times daily as needed. Start on the third day after chemotherapy. 30 tablet 1  . prochlorperazine (COMPAZINE) 10 MG tablet Take 1 tablet (10 mg total) by  mouth every 6 (six) hours as needed (Nausea or vomiting). 60 tablet 1   No current facility-administered medications for this visit.    Facility-Administered Medications Ordered in Other Visits  Medication Dose Route Frequency Provider Last Rate Last Dose  . 0.9 %  sodium chloride infusion   Intravenous Once [name], MD      . dexamethasone (DECADRON) 20 mg in sodium chloride 0.9 % 50 mL IVPB  20 mg Intravenous Once [name], MD      . heparin lock flush 100 unit/mL  500 Units Intracatheter Once PRN [name], MD      . PACLitaxel (TAXOL) 150 mg in sodium chloride 0.9 % 250 mL chemo infusion (</= 35m/m2)  80 mg/m2 (Treatment Plan Recorded) Intravenous Once FTruitt Merle MD      . sodium chloride flush (NS) 0.9 % injection 10 mL  10 mL Intracatheter PRN FTruitt Merle MD        PHYSICAL EXAMINATION: ECOG PERFORMANCE STATUS: 1 - Symptomatic but completely ambulatory  Vitals:   12/17/17 0905  BP: (!) 147/87  Pulse: 87  Resp: 18  Temp: 98.1 F (36.7 C)  SpO2: 98%   Filed Weights   12/17/17 0905  Weight: 168 lb (76.2 kg)    GENERAL:alert, no distress and comfortable SKIN: no rashes or significant lesions EYES: normal, Conjunctiva are pink and non-injected, sclera clear OROPHARYNX:no thrush or ulcers LYMPH:  no palpable cervical or supraclavicular lymphadenopathy LUNGS: clear to auscultation with normal breathing effort HEART: regular rate & rhythm and no murmurs and no lower extremity edema ABDOMEN:abdomen soft, non-tender and normal bowel sounds Musculoskeletal:no cyanosis of digits and no clubbing  NEURO: alert & oriented x 3 with fluent speech, no focal motor/sensory deficits Breast exam shows right breast mass with palpable mass in the central breast, measuring 7.5 x6 cm including the nipple, unchanged in size, with mild slightly improved skin thickening and edema. approx 1.5 cm right axillary node. No palpable left breast mass or axillary adenopathy.  PAC without erythema    LABORATORY DATA:  I have reviewed the data as listed CBC Latest Ref Rng & Units 12/17/2017 12/03/2017 11/19/2017  WBC 3.9 - 10.3 K/uL 18.8(H) 13.4(H) 11.4(H)  Hemoglobin 11.6 - 15.9 g/dL 10.0(L) 10.8(L) 11.8  Hematocrit 34.8 - 46.6 % 30.4(L) 31.9(L) 34.9  Platelets 145 - 400 K/uL 208 330 214     CMP Latest Ref Rng & Units 12/17/2017 12/03/2017 11/19/2017  Glucose 70 - 99 mg/dL 119(H) 101(H) 107  BUN 8 - 23 mg/dL _0 Creatinine 0.44 - 1.00 mg/dL 0.79 0.74 0.75  Sodium 135 - 145 mmol/L 143 143 143  Potassium 3.5 - 5.1 mmol/L 3.5 4.0 4.0  Chloride 98 - 111 mmol/L 107 107 107  CO2 22 - 32 mmol/L _1 Calcium 8.9 - 10.3 mg/dL  9.3 9.3 9.6  Total Protein 6.5 - 8.1 g/dL 6.2(L) 6.2(L) 6.4  Total Bilirubin 0.3 - 1.2 mg/dL <0.2(L) <0.2(L) <0.2(L)  Alkaline Phos 38 - 126 U/L 113 103 104  AST 15 - 41 U/L 10(L) 11(L) 13  ALT 0 - 44 U/L _0 PATHOLOGY   Diagnosis 10/13/17 1. Breast, right, needle core biopsy, 1 o'clock - INVASIVE DUCTAL CARCINOMA. - SEE COMMENT. 2. Breast, right, needle core biopsy, 7 o'clock - INVASIVE DUCTAL CARCINOMA. - SEE COMMENT. Microscopic Comment 1. and 2. The carcinoma in parts 1 and 2 is morphologically similar and appears grade II. Because breast prognostic profiles were performed on the patients previous case, [id number], it will not be repeated on the current case unless requested.     Diagnosis 09/23/17 1. Breast, left, needle core biopsy, 2 o'clock - INVASIVE DUCTAL CARCINOMA. - SEE COMMENT. 2. Breast, right, needle core biopsy, 11:30 o'clock - INVASIVE MAMMARY CARCINOMA. - SEE COMMENT. 3. Lymph node, needle/core biopsy, right axilla - METASTATIC MAMMARY CARCINOMA IN 1 OF 1 LYMPH NODE (1/1). Microscopic Comment 1. There is a small focus of grade I invasive ductal carcinoma. A complete breast prognostic profile will be attempted and the results reported separately. The results were called to the [facility] on  09/24/2017. 2. The carcinoma appears grade II. An E-Cadherin stain and a breast prognostic profile will be performed on part 2 and the results reported separately. (JBK:kh 09-24-17) [name] 1. HER2 Negative by FISH Estrogen receptor: 100% positive, strong staining Progesterone receptor: 80% positive, strong staining Ki67: 5% 2. HER2 Negative by FISH Estrogen receptor: 100% positive, strong staining Progesterone receptor: 0%, negative  Ki67: 30% 2. The tumor cells are strongly positive for E-cadherin, supporting a ductal phenotype    RADIOGRAPHIC STUDIES: I have personally reviewed the radiological images as listed and agreed with the findings in the report. US Breast Ltd Uni Left Inc Axilla  Result Date: 12/16/2017 CLINICAL DATA:  62 year old female with multicentric invasive right breast cancer metastatic to the right axillary lymph nodes, as well as a single focus of invasive breast cancer on the left. The patient is currently undergoing chemotherapy and presents for evaluation for treatment response. EXAM: DIGITAL DIAGNOSTIC BILATERAL MAMMOGRAM WITH CAD AND TOMO ULTRASOUND BILATERAL BREAST COMPARISON:  Previous exam(s). ACR Breast Density Category c: The breast tissue is heterogeneously dense, which may obscure small masses. FINDINGS: Targeted ultrasound is performed, showing numerous irregular, hypoechoic masses scattered throughout the right breast. These measure stable to minimally decreased in size when compared to prior mammograms. A mass at the 1 o'clock position 2 cm from the nipple measures 1.7 x 0.9 x 0.9 cm (previously 1.7 x 1.6 x 0.5 cm). A mass at the 7 o'clock position 4 cm from the nipple measures 1.6 x 1.6 x 1.4 cm (previously 2.7 x 1.7 x 1.4 cm). A large mass involving the upper outer quadrant measures 7.6 x 2.1 cm (previously 7.4 x 2.8 cm). A large right axillary lymph node measures 2.9 x 2.3 x 2.1 cm (previously 2.8 x 2.5 x 2.5 cm). Evaluation of the left breast demonstrates  grossly unchanged appearance of a circumscribed mass at the 2 o'clock position 6 cm from the nipple. It measures 0.9 x 0.8 x 0.6 cm (previously 0.9 x 0.9 x 0.5 cm). The patient was then brought to the mammographic suite for additional evaluation of the bilateral breast given the diffuse disease identified sonographically. A global asymmetry with associated distortion and post biopsy  clips in the upper outer quadrant of the right breast appears slightly less dense on today's mammographic views. There is increased skin and trabecular thickening involving the entire right breast. Mammographic images were processed with CAD. IMPRESSION: 1. Stable to minimal decrease in size of multiple irregular masses throughout the right breast consistent with the patient's sites of biopsy proven malignancy. While the mammographic appearance is slightly less dense in this region, there is increased skin and trabecular thickening involving the entire right breast. 2. Stable appearance of morphologically abnormal right axillary lymph nodes, consistent with biopsy proven metastatic disease. 3. Stable appearance of a left breast mass, consistent with biopsy proven malignancy. RECOMMENDATION: Per treatment plan. I have discussed the findings and recommendations with the patient. Results were also provided in writing at the conclusion of the visit. If applicable, a reminder letter will be sent to the patient regarding the next appointment. BI-RADS CATEGORY  6: Known biopsy-proven malignancy. Electronically Signed   By: Kristopher Oppenheim M.D.   On: 12/16/2017 16:27   US Breast Ltd Uni Right Inc Axilla  Result Date: 12/16/2017 CLINICAL DATA:  62 year old female with multicentric invasive right breast cancer metastatic to the right axillary lymph nodes, as well as a single focus of invasive breast cancer on the left. The patient is currently undergoing chemotherapy and presents for evaluation for treatment response. EXAM: DIGITAL DIAGNOSTIC  BILATERAL MAMMOGRAM WITH CAD AND TOMO ULTRASOUND BILATERAL BREAST COMPARISON:  Previous exam(s). ACR Breast Density Category c: The breast tissue is heterogeneously dense, which may obscure small masses. FINDINGS: Targeted ultrasound is performed, showing numerous irregular, hypoechoic masses scattered throughout the right breast. These measure stable to minimally decreased in size when compared to prior mammograms. A mass at the 1 o'clock position 2 cm from the nipple measures 1.7 x 0.9 x 0.9 cm (previously 1.7 x 1.6 x 0.5 cm). A mass at the 7 o'clock position 4 cm from the nipple measures 1.6 x 1.6 x 1.4 cm (previously 2.7 x 1.7 x 1.4 cm). A large mass involving the upper outer quadrant measures 7.6 x 2.1 cm (previously 7.4 x 2.8 cm). A large right axillary lymph node measures 2.9 x 2.3 x 2.1 cm (previously 2.8 x 2.5 x 2.5 cm). Evaluation of the left breast demonstrates grossly unchanged appearance of a circumscribed mass at the 2 o'clock position 6 cm from the nipple. It measures 0.9 x 0.8 x 0.6 cm (previously 0.9 x 0.9 x 0.5 cm). The patient was then brought to the mammographic suite for additional evaluation of the bilateral breast given the diffuse disease identified sonographically. A global asymmetry with associated distortion and post biopsy clips in the upper outer quadrant of the right breast appears slightly less dense on today's mammographic views. There is increased skin and trabecular thickening involving the entire right breast. Mammographic images were processed with CAD. IMPRESSION: 1. Stable to minimal decrease in size of multiple irregular masses throughout the right breast consistent with the patient's sites of biopsy proven malignancy. While the mammographic appearance is slightly less dense in this region, there is increased skin and trabecular thickening involving the entire right breast. 2. Stable appearance of morphologically abnormal right axillary lymph nodes, consistent with biopsy  proven metastatic disease. 3. Stable appearance of a left breast mass, consistent with biopsy proven malignancy. RECOMMENDATION: Per treatment plan. I have discussed the findings and recommendations with the patient. Results were also provided in writing at the conclusion of the visit. If applicable, a reminder letter will be sent  to the patient regarding the next appointment. BI-RADS CATEGORY  6: Known biopsy-proven malignancy. Electronically Signed   By: Kristopher Oppenheim M.D.   On: 12/16/2017 16:27   Mm Diag Breast Tomo Bilateral  Result Date: 12/16/2017 CLINICAL DATA:  61 year old female with multicentric invasive right breast cancer metastatic to the right axillary lymph nodes, as well as a single focus of invasive breast cancer on the left. The patient is currently undergoing chemotherapy and presents for evaluation for treatment response. EXAM: DIGITAL DIAGNOSTIC BILATERAL MAMMOGRAM WITH CAD AND TOMO ULTRASOUND BILATERAL BREAST COMPARISON:  Previous exam(s). ACR Breast Density Category c: The breast tissue is heterogeneously dense, which may obscure small masses. FINDINGS: Targeted ultrasound is performed, showing numerous irregular, hypoechoic masses scattered throughout the right breast. These measure stable to minimally decreased in size when compared to prior mammograms. A mass at the 1 o'clock position 2 cm from the nipple measures 1.7 x 0.9 x 0.9 cm (previously 1.7 x 1.6 x 0.5 cm). A mass at the 7 o'clock position 4 cm from the nipple measures 1.6 x 1.6 x 1.4 cm (previously 2.7 x 1.7 x 1.4 cm). A large mass involving the upper outer quadrant measures 7.6 x 2.1 cm (previously 7.4 x 2.8 cm). A large right axillary lymph node measures 2.9 x 2.3 x 2.1 cm (previously 2.8 x 2.5 x 2.5 cm). Evaluation of the left breast demonstrates grossly unchanged appearance of a circumscribed mass at the 2 o'clock position 6 cm from the nipple. It measures 0.9 x 0.8 x 0.6 cm (previously 0.9 x 0.9 x 0.5 cm). The patient was  then brought to the mammographic suite for additional evaluation of the bilateral breast given the diffuse disease identified sonographically. A global asymmetry with associated distortion and post biopsy clips in the upper outer quadrant of the right breast appears slightly less dense on today's mammographic views. There is increased skin and trabecular thickening involving the entire right breast. Mammographic images were processed with CAD. IMPRESSION: 1. Stable to minimal decrease in size of multiple irregular masses throughout the right breast consistent with the patient's sites of biopsy proven malignancy. While the mammographic appearance is slightly less dense in this region, there is increased skin and trabecular thickening involving the entire right breast. 2. Stable appearance of morphologically abnormal right axillary lymph nodes, consistent with biopsy proven metastatic disease. 3. Stable appearance of a left breast mass, consistent with biopsy proven malignancy. RECOMMENDATION: Per treatment plan. I have discussed the findings and recommendations with the patient. Results were also provided in writing at the conclusion of the visit. If applicable, a reminder letter will be sent to the patient regarding the next appointment. BI-RADS CATEGORY  6: Known biopsy-proven malignancy. Electronically Signed   By: Kristopher Oppenheim M.D.   On: 12/16/2017 16:27     ASSESSMENT & PLAN:  Telisha Zawadzki Dixonis a 62 y.o.Caucasian femalewith history of substance usepresented with right nipple inversion for 3 months.    1. 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 Social support 3. Substance use 4. Insomnia 5.  Nutrition, exercise  Ms. Bowmer appears stable. She completed 4 cycles AC with Udenyca. She tolerated regimen well overall with intermittent fatigue. Her clinical breast exam and interim mammo and  US reveal stable to minimal decrease in size of multiple irregular masses in the right breast, stable right axillary lymph nodes, and stable left breast mass. Her disease does not appear to be very sensitive to chemotherapy. The patient was seen with [name] who reviewed imaging and recommends to proceed with weekly taxol starting today. Will likely repeat image in 1 month with mammo/US vs breast MRI to further evaluate her response to therapy. If there is minimal change, may stop chemotherapy early and proceed to surgery. The patient agrees with the plan.   BP is slightly improved, weight is stable. Labs adequate for treatment. We reviewed potential side effects of taxol. She is prepared for cryotherapy to reduce risk of neuropathy. Will monitor her closely with f/u before each treatment.   PLAN: -Labs reviewed, proceed with cycle 1 taxol today, continue week -F/u weekly with each cycle  -Plan to repeat imaging after 1 month of taxol   All questions were answered. The patient knows to call the clinic with any problems, questions or concerns. No barriers to learning was detected.     Alla Feeling, NP 12/17/17   I have seen the patient, examined her. I agree with the assessment and and plan and have edited the notes.   Ms Lovins is tolerating chemotherapy well overall, still able to work full-time. However her right breast mass and right axillary adenopathy has not decreased significantly since she started chemo.  I reviewed her interim ultrasound and mammogram findings, which showed slightly decreased size, overall stable. She has not had good clinical response, will proceed with plan weekly Taxol for now, and will get a repeated breast MRI after 4 weeks of Taxol, then make a decision if we need to  stop chemo earlier, and proceed with surgery.  Patient is agreeable with the plan.  [name]  12/17/2017

## 2017-12-19 ENCOUNTER — Telehealth: Payer: Self-pay | Admitting: Hematology

## 2017-12-19 NOTE — Telephone Encounter (Signed)
Appointments scheduled , Tried calling patient 2 x unable to LM due to NO VM befing set up. Schedule/ Calendar mailed to patient per 7/11 los

## 2017-12-19 NOTE — Telephone Encounter (Signed)
Appointments scheduled letter/calendar mailed / spoke with patient per 7/11 los

## 2017-12-24 ENCOUNTER — Telehealth: Payer: Self-pay | Admitting: Hematology

## 2017-12-24 NOTE — Telephone Encounter (Signed)
Patient came in today - thought her appt was today and not tomorrow , patient is not able to come in tomorrow and per Rn charge pt is unable to be added into the treatment for today .   Per [name] okay to follow up next week and missed a week due to patient availability .- come back on 7/24   Printed out an updated calender for patient.

## 2017-12-25 ENCOUNTER — Ambulatory Visit: Payer: Medicaid Other | Admitting: Hematology

## 2017-12-25 ENCOUNTER — Ambulatory Visit: Payer: Medicaid Other

## 2017-12-25 ENCOUNTER — Other Ambulatory Visit: Payer: Medicaid Other

## 2017-12-31 ENCOUNTER — Encounter: Payer: Self-pay | Admitting: *Deleted

## 2017-12-31 ENCOUNTER — Encounter: Payer: Self-pay | Admitting: Nurse Practitioner

## 2017-12-31 ENCOUNTER — Inpatient Hospital Stay: Payer: Medicaid Other

## 2017-12-31 ENCOUNTER — Other Ambulatory Visit: Payer: Medicaid Other

## 2017-12-31 ENCOUNTER — Inpatient Hospital Stay (HOSPITAL_BASED_OUTPATIENT_CLINIC_OR_DEPARTMENT_OTHER): Payer: Medicaid Other | Admitting: Nurse Practitioner

## 2017-12-31 ENCOUNTER — Telehealth: Payer: Self-pay | Admitting: *Deleted

## 2017-12-31 ENCOUNTER — Ambulatory Visit: Payer: Medicaid Other

## 2017-12-31 VITALS — BP 159/86 | HR 80 | Temp 98.0°F | Resp 18 | Ht 64.0 in | Wt 171.6 lb

## 2017-12-31 DIAGNOSIS — C50811 Malignant neoplasm of overlapping sites of right female breast: Secondary | ICD-10-CM

## 2017-12-31 DIAGNOSIS — C50412 Malignant neoplasm of upper-outer quadrant of left female breast: Secondary | ICD-10-CM

## 2017-12-31 DIAGNOSIS — R030 Elevated blood-pressure reading, without diagnosis of hypertension: Secondary | ICD-10-CM

## 2017-12-31 DIAGNOSIS — Z170 Estrogen receptor positive status [ER+]: Principal | ICD-10-CM

## 2017-12-31 DIAGNOSIS — C773 Secondary and unspecified malignant neoplasm of axilla and upper limb lymph nodes: Secondary | ICD-10-CM | POA: Diagnosis not present

## 2017-12-31 DIAGNOSIS — R21 Rash and other nonspecific skin eruption: Secondary | ICD-10-CM

## 2017-12-31 DIAGNOSIS — Z5111 Encounter for antineoplastic chemotherapy: Secondary | ICD-10-CM | POA: Diagnosis not present

## 2017-12-31 DIAGNOSIS — Z95828 Presence of other vascular implants and grafts: Secondary | ICD-10-CM

## 2017-12-31 LAB — CMP (CANCER CENTER ONLY)
ALBUMIN: 3.7 g/dL (ref 3.5–5.0)
ALT: 13 U/L (ref 0–44)
AST: 14 U/L — AB (ref 15–41)
Alkaline Phosphatase: 72 U/L (ref 38–126)
Anion gap: 7 (ref 5–15)
BUN: 23 mg/dL (ref 8–23)
CHLORIDE: 108 mmol/L (ref 98–111)
CO2: 25 mmol/L (ref 22–32)
Calcium: 9 mg/dL (ref 8.9–10.3)
Creatinine: 0.7 mg/dL (ref 0.44–1.00)
GFR, Est AFR Am: >60 mL/min (ref >60)
Glucose, Bld: 93 mg/dL (ref 70–99)
POTASSIUM: 3.9 mmol/L (ref 3.5–5.1)
Sodium: 140 mmol/L (ref 135–145)
Total Bilirubin: 0.3 mg/dL (ref 0.3–1.2)
Total Protein: 6.3 g/dL — ABNORMAL LOW (ref 6.5–8.1)

## 2017-12-31 LAB — CBC WITH DIFFERENTIAL (CANCER CENTER ONLY)
Basophils Absolute: 0.1 10*3/uL (ref 0.0–0.1)
Basophils Relative: 1 %
EOS PCT: 1 %
Eosinophils Absolute: 0.1 10*3/uL (ref 0.0–0.5)
HCT: 32.1 % — ABNORMAL LOW (ref 34.8–46.6)
HEMOGLOBIN: 10.8 g/dL — AB (ref 11.6–15.9)
LYMPHS ABS: 1.2 10*3/uL (ref 0.9–3.3)
Lymphocytes Relative: 19 %
MCH: 32 pg (ref 25.1–34.0)
MCHC: 33.8 g/dL (ref 31.5–36.0)
MCV: 94.5 fL (ref 79.5–101.0)
Monocytes Absolute: 0.6 10*3/uL (ref 0.1–0.9)
Monocytes Relative: 10 %
Neutro Abs: 4.3 10*3/uL (ref 1.5–6.5)
Neutrophils Relative %: 69 %
Platelet Count: 221 10*3/uL (ref 145–400)
RBC: 3.39 MIL/uL — AB (ref 3.70–5.45)
RDW: 19.5 % — ABNORMAL HIGH (ref 11.2–14.5)
WBC: 6.2 10*3/uL (ref 3.9–10.3)

## 2017-12-31 MED ORDER — DIPHENHYDRAMINE HCL 50 MG/ML IJ SOLN
INTRAMUSCULAR | Status: AC
  Filled 2017-12-31: qty 1

## 2017-12-31 MED ORDER — HEPARIN SOD (PORK) LOCK FLUSH 100 UNIT/ML IV SOLN
500.0000 [IU] | Freq: Once | INTRAVENOUS | Status: AC | PRN
  Administered 2017-12-31: 500 [IU]
  Filled 2017-12-31: qty 5

## 2017-12-31 MED ORDER — FAMOTIDINE IN NACL 20-0.9 MG/50ML-% IV SOLN
INTRAVENOUS | Status: AC
  Filled 2017-12-31: qty 50

## 2017-12-31 MED ORDER — SODIUM CHLORIDE 0.9 % IV SOLN
Freq: Once | INTRAVENOUS | Status: AC
  Administered 2017-12-31: 13:00:00 via INTRAVENOUS
  Filled 2017-12-31: qty 250

## 2017-12-31 MED ORDER — SODIUM CHLORIDE 0.9 % IV SOLN
20.0000 mg | Freq: Once | INTRAVENOUS | Status: AC
  Administered 2017-12-31: 20 mg via INTRAVENOUS
  Filled 2017-12-31: qty 2

## 2017-12-31 MED ORDER — SODIUM CHLORIDE 0.9% FLUSH
10.0000 mL | INTRAVENOUS | Status: DC | PRN
  Administered 2017-12-31: 10 mL
  Filled 2017-12-31: qty 10

## 2017-12-31 MED ORDER — FAMOTIDINE IN NACL 20-0.9 MG/50ML-% IV SOLN
20.0000 mg | Freq: Once | INTRAVENOUS | Status: AC
  Administered 2017-12-31: 20 mg via INTRAVENOUS

## 2017-12-31 MED ORDER — SODIUM CHLORIDE 0.9 % IV SOLN
80.0000 mg/m2 | Freq: Once | INTRAVENOUS | Status: AC
  Administered 2017-12-31: 150 mg via INTRAVENOUS
  Filled 2017-12-31: qty 25

## 2017-12-31 MED ORDER — SODIUM CHLORIDE 0.9% FLUSH
10.0000 mL | Freq: Once | INTRAVENOUS | Status: AC
  Administered 2017-12-31: 10 mL
  Filled 2017-12-31: qty 10

## 2017-12-31 MED ORDER — DIPHENHYDRAMINE HCL 50 MG/ML IJ SOLN
50.0000 mg | Freq: Once | INTRAMUSCULAR | Status: AC
  Administered 2017-12-31: 50 mg via INTRAVENOUS

## 2017-12-31 NOTE — Progress Notes (Signed)
[place]  [phone] [phone]  Clinic Follow up Note   Patient Care Team: [name], MD as PCP - General (Family Medicine) [name], MD as Consulting Physician (General Surgery) [name], MD as Consulting Physician (Hematology) 12/31/2017  SUMMARY OF ONCOLOGIC HISTORY: Oncology History   Cancer Staging Cancer of overlapping sites of right female breast [facility]) Staging form: Breast, AJCC 8th Edition - Clinical stage from 09/23/2017: Stage IIIA (cT3, cN1, cM0, G2, ER+, PR-, HER2-) - Signed by [name], MD on 10/09/2017 - Clinical: cT3 - Unsigned       Cancer of overlapping sites of right female breast ([place])   09/09/2017 Mammogram    IMPRESSION: 1. Highly suspicious large right breast mass involving the outer and upper breast extending from approximately 9 o'clock through 12-1 o'clock, maximum measurement approximating 7.5 cm. The mass involves the right nipple, accounting for nipple retraction. Architectural distortion, microcalcifications and diffuse trabecular thickening and skin thickening are associated with the large mass, raising the possibility of this representing an inflammatory cancer. 2. Satellite masses separate from the dominant mass involving the lower outer quadrant and the upper inner quadrant of the right breast, measured above. 3. Two adjacent pathologic right axillary lymph nodes. 4. Indeterminate 0.9 cm solid mass involving the upper outer quadrant of the left breast which accounts for a mammographic finding. 5. No pathologic left axillary lymphadenopathy.      09/09/2017 Breast US    Targeted right breast ultrasound is performed, showing a very large hypoechoic mass with irregular margins extending from the approximate 9 o'clock position through the 12 to 1 o'clock position. On the CC gait image, the mass measures maximally approximately 7.5 cm. The mass has irregular margins, demonstrates acoustic shadowing, and  demonstrates internal power Doppler flow. The mass extends into the retroareolar region and involves the nipple, accounting for the nipple retraction.  Separate from the dominant mass at the 7 o'clock position approximately 4 cm from nipple is a hypoechoic antiparallel mass with irregular margins measuring approximately 1.4 x 2.7 x 1.7 cm.  Separate from the dominant mass at the 1 o'clock position approximately 2 cm from nipple is a hypoechoic mass with irregular margins measuring approximately 1.7 x 0.5 x 1.6 cm. Both of these masses also demonstrate internal power Doppler flow.  Sonographic evaluation of the right axilla demonstrates 2 adjacent pathologic lymph nodes, the larger measuring approximately 2.5 x 2.5 x 2.8 cm, the smaller measuring approximately 2.0 x 0.8 x 2.7 cm.      09/23/2017 Initial Biopsy    Diagnosis 1. Breast, left, needle core biopsy, 2 o'clock - INVASIVE DUCTAL CARCINOMA. - SEE COMMENT. 2. Breast, right, needle core biopsy, 11:30 o'clock - INVASIVE MAMMARY CARCINOMA. - SEE COMMENT. 3. Lymph node, needle/core biopsy, right axilla - METASTATIC MAMMARY CARCINOMA IN 1 OF 1 LYMPH NODE (1/1). Microscopic Comment 1. There is a small focus of grade I invasive ductal carcinoma. A complete breast prognostic profile will be attempted and the results reported separately. The results were called to the [facility] on 09/24/2017. 2. The carcinoma appears grade II. An E-Cadherin stain and a breast prognostic profile will be performed on part 2 and the results reported separately. (JBK:kh 09-24-17) [name]  1. HER2 Negative by FISH Estrogen receptor: 100% positive, strong staining Progesterone receptor: 80% positive, strong staining Ki67: 5%  2. HER2 Negative by FISH Estrogen receptor: 100% positive, strong staining Progesterone receptor: 0%, negative  Ki67: 30% 2. The tumor  cells are strongly positive for E-cadherin, supporting a ductal phenotype       09/23/2017 Cancer Staging    Staging form: Breast, AJCC 8th Edition - Clinical stage from 09/23/2017: Stage IIIA (cT3, cN1, cM0, G2, ER+, PR-, HER2-) - Signed by [name], MD on 10/09/2017      10/07/2017 Breast MRI    IMPRESSION: 1. Biopsy proven malignancy involving all 4 quadrants of the right breast, although predominantly located in the anterior third of the central upper right breast measures 6.8 x 9.6 x 7.2 cm.  2. Five pathologic right axillary lymph nodes, compatible with biopsy proven axillary metastases.  3. Small mass with associated biopsy related changes in the upper-outer left breast at site of known malignancy measures 0.9 x 0.8 x 0.7 cm. No abnormal lymph nodes seen in the left axilla.  RECOMMENDATION: Treatment plan for known bilateral breast malignancy.  BI-RADS CATEGORY  6: Known biopsy-proven malignancy.      10/08/2017 Initial Diagnosis    Cancer of overlapping sites of right female breast ([place])      10/13/2017 Pathology Results    Diagnosis 1. Breast, right, needle core biopsy, 1 o'clock - INVASIVE DUCTAL CARCINOMA. - SEE COMMENT. 2. Breast, right, needle core biopsy, 7 o'clock - INVASIVE DUCTAL CARCINOMA. - SEE COMMENT. Microscopic Comment 1. and 2. The carcinoma in parts 1 and 2 is morphologically similar and appears grade II. Because breast prognostic profiles were performed on the patients previous case, [id number], it will not be repeated on the current case unless requested.       10/17/2017 Imaging    CT CAP IMPRESSION: 1. Infiltrative right breast mass with overlying skin thickening is identified compatible with known breast cancer. 2. Enlarged right axillary lymph nodes compatible with metastatic adenopathy. 3. No additional sites of disease identified within the chest and no evidence for metastasis to the abdomen or pelvis. 4. Aortic atherosclerosis and 3 vessel coronary artery atherosclerotic calcifications. Aortic  Atherosclerosis (ICD10-I70.0).      10/17/2017 Imaging    Bone Scan FINDINGS: There are no foci of increased or decreased radiotracer uptake to suggest osseous metastatic disease. 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 There is degenerative type uptake within both shoulders and the left ankle.  Normal physiologic activity is identified within the kidneys and urinary bladder.  IMPRESSION: No evidence of osseous metastatic disease.      10/22/2017 -  Chemotherapy    AC q2 weeks x4 cycles starting 10/22/17-12/03/17, followed by weekly taxol x12 starting 12/17/17      12/16/2017 Breast US    IMPRESSION: 1. Stable to minimal decrease in size of multiple irregular masses throughout the right breast consistent with the patient's sites of biopsy proven malignancy. While the mammographic appearance is slightly less dense in this region, there is increased skin and trabecular thickening involving the entire right breast. 2. Stable appearance of morphologically abnormal right axillary lymph nodes, consistent with biopsy proven metastatic disease. 3. Stable appearance of a left breast mass, consistent with biopsy proven malignancy.        Malignant neoplasm of left breast in female, estrogen receptor positive ([place])   09/09/2017 Breast US    Targeted left breast ultrasound is performed, showing an oval parallel hypoechoic mass containing microcalcifications at the 2 o'clock position approximately 6 cm from nipple measuring approximately 0.9 x 0.5 x 0.9 cm, corresponding to the mammographic finding.  Sonographic evaluation of  the left axilla demonstrates no pathologic lymphadenopathy.      09/23/2017 Initial Biopsy    Diagnosis 1. Breast, left, needle core biopsy, 2 o'clock - INVASIVE DUCTAL CARCINOMA. - SEE COMMENT. 2. Breast, right, needle core biopsy, 11:30 o'clock - INVASIVE MAMMARY  CARCINOMA. - SEE COMMENT. 3. Lymph node, needle/core biopsy, right axilla - METASTATIC MAMMARY CARCINOMA IN 1 OF 1 LYMPH NODE (1/1). Microscopic Comment 1. There is a small focus of grade I invasive ductal carcinoma. A complete breast prognostic profile will be attempted and the results reported separately. The results were called to the [facility] on 09/24/2017. 2. The carcinoma appears grade II. An E-Cadherin stain and a breast prognostic profile will be performed on part 2 and the results reported separately. (JBK:kh 09-24-17) [name]  1. HER2 Negative by FISH Estrogen receptor: 100% positive, strong staining Progesterone receptor: 80% positive, strong staining Ki67: 5%  2. HER2 Negative by FISH Estrogen receptor: 100% positive, strong staining Progesterone receptor: 0%, negative  Ki67: 30%      10/08/2017 Initial Diagnosis    Malignant neoplasm of left breast in female, estrogen receptor positive ([place])      12/16/2017 Breast US    IMPRESSION: 3. Stable appearance of a left breast mass, consistent with biopsy proven malignancy.      CURRENT THERAPY : Neoadjuvant chemo AC q2 weeks x4 cycles starting 10/22/17, followed by weekly taxol x12  INTERVAL HISTORY: Darlene Beasley returns for follow-up and second cycle weekly Taxol as scheduled.  She pleaded cycle 1 on 12/17/2017, she missed cycle 2 due to scheduling miscommunication.  Taxol infusion went well, she continues to work full-time and some over time, has normal appetite.  Has nausea, vomiting, constipation, or diarrhea.  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  She had transient back pain over the weekend due to being on her feet for many hours at work.  Denies pain otherwise, denies breast pain.  She has slight tingling to hands which was there prior to chemo, no worse on treatment.  She has a faint itching rash to her upper back, she thinks likely related to new detergent she uses for her work  close.  Body wash with cocoa butter eases the itching.  Denies fever, chills, cough, chest pain, or dyspnea.  REVIEW OF SYSTEMS:   Constitutional: Denies fevers, chills or abnormal weight loss Eyes: Denies blurriness of vision (+) clear rhinorrhea (+) tearing  Ears, nose, mouth, throat, and face: Denies mucositis or sore throat Respiratory: Denies cough, dyspnea or wheezes Cardiovascular: Denies palpitation, chest discomfort or lower extremity swelling Gastrointestinal:  Denies nausea, vomiting, constipation, diarrhea, heartburn or change in bowel habits Skin: (+) itching skin rash to upper back  Lymphatics: Denies new lymphadenopathy or easy bruising Neurological:Denies numbness, tingling or new weaknesses (+) intermittent tingling to hands, present prior to taxol  Behavioral/Psych: Mood is stable, no new changes  MSK: (+) transient back pain, attributed to standing long hours at work   All other systems were reviewed with the patient and are negative.  MEDICAL HISTORY:  Past Medical History:  Diagnosis Date  . Adopted   . Arthritis   . breast ca dx'd 08/2017  . Genetic testing 11/14/2017   Multi-Cancer panel (83 genes) @ [facility] - Pathogenic mutation in the MITF gene  . GERD (gastroesophageal reflux disease)   . 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 IR IMAGING GUIDED PORT INSERTION  10/20/2017  . IR US GUIDE VASC ACCESS LEFT  10/20/2017  . TONSILLECTOMY Bilateral   . TUBAL LIGATION      I have reviewed the social history and family history with the patient and they are unchanged from previous note.  ALLERGIES:  is allergic to bee venom.  MEDICATIONS:  Current Outpatient Medications  Medication Sig Dispense Refill  . Coenzyme Q10 100 MG capsule Take 100 mg by mouth daily.     [name] lidocaine-prilocaine (EMLA) cream Apply to affected area  once 30 g 3  . ondansetron (ZOFRAN) 8 MG tablet Take 1 tablet (8 mg total) by mouth 2 (two) times daily as needed. Start on the third day after chemotherapy. (Patient not taking: Reported on 12/31/2017) 30 tablet 1  . prochlorperazine (COMPAZINE) 10 MG tablet Take 1 tablet (10 mg total) by mouth every 6 (six) hours as needed (Nausea or vomiting). (Patient not taking: Reported on 12/31/2017) 60 tablet 1   No current facility-administered medications for this visit.     PHYSICAL EXAMINATION: ECOG PERFORMANCE STATUS: 0 - Asymptomatic  Vitals:   12/31/17 1154  BP: (!) 159/86  Pulse: 80  Resp: 18  Temp: 98 F (36.7 C)  SpO2: 100%   Filed Weights   12/31/17 1154  Weight: 171 lb 9.6 oz (77.8 kg)    GENERAL:alert, no distress and comfortable SKIN: scattered faint maculopapular rash to upper back  EYES: normal, Conjunctiva are pink and non-injected, sclera clear OROPHARYNX:no thrush or ulcers LYMPH:  no palpable cervical or supraclavicular lymphadenopathy LUNGS: clear to auscultation with normal breathing effort HEART: regular rate & rhythm and no murmurs and no lower extremity edema ABDOMEN:abdomen soft, non-tender and normal bowel sounds Musculoskeletal:no cyanosis of digits and no clubbing  NEURO: alert & oriented x 3 with fluent speech, no focal motor/sensory deficits Breast exam: right breast with nipple retraction, palpable mass involving the outer/central portion of the right breast measures approx 6.5x6 cm today, mild skin thickening and edema; right axillary LN approx 1 cm. No palpable mass in left breast or axilla PAC without erythema   LABORATORY DATA:  I have reviewed the data as listed CBC Latest Ref Rng & Units 12/31/2017 12/17/2017 12/03/2017  WBC 3.9 - 10.3 K/uL 6.2 18.8(H) 13.4(H)  Hemoglobin 11.6 - 15.9 g/dL 10.8(L) 10.0(L) 10.8(L)  Hematocrit 34.8 - 46.6 % 32.1(L) 30.4(L) 31.9(L)  Platelets 145 - 400 K/uL 221 208 330     CMP Latest Ref Rng & Units 12/31/2017  12/17/2017 12/03/2017  Glucose 70 - 99 mg/dL 93 119(H) 101(H)  BUN 8 - 23 mg/dL _0 Creatinine 0.44 - 1.00 mg/dL 0.70 0.79 0.74  Sodium 135 - 145 mmol/L 140 143 143  Potassium 3.5 - 5.1 mmol/L 3.9 3.5 4.0  Chloride 98 - 111 mmol/L 108 107 107  CO2 22 - 32 mmol/L _1 Calcium 8.9 - 10.3 mg/dL 9.0 9.3 9.3  Total Protein 6.5 - 8.1 g/dL 6.3(L) 6.2(L) 6.2(L)  Total Bilirubin 0.3 - 1.2 mg/dL 0.3 <0.2(L) <0.2(L)  Alkaline Phos 38 - 126 U/L 72 113 103  AST 15 - 41 U/L 14(L) 10(L) 11(L)  ALT 0 - 44 U/L _2 PATHOLOGY   Diagnosis 10/13/17 1. Breast, right, needle core biopsy, 1 o'clock - INVASIVE DUCTAL CARCINOMA. - SEE COMMENT. 2. Breast, right, needle core biopsy, 7 o'clock - INVASIVE DUCTAL CARCINOMA. - SEE COMMENT. Microscopic Comment 1.  and 2. The carcinoma in parts 1 and 2 is morphologically similar and appears grade II. Because breast prognostic profiles were performed on the patients previous case, [id number], it will not be repeated on the current case unless requested.     Diagnosis 09/23/17 1. Breast, left, needle core biopsy, 2 o'clock - INVASIVE DUCTAL CARCINOMA. - SEE COMMENT. 2. Breast, right, needle core biopsy, 11:30 o'clock - INVASIVE MAMMARY CARCINOMA. - SEE COMMENT. 3. Lymph node, needle/core biopsy, right axilla - METASTATIC MAMMARY CARCINOMA IN 1 OF 1 LYMPH NODE (1/1). Microscopic Comment 1. There is a small focus of grade I invasive ductal carcinoma. A complete breast prognostic profile will be attempted and the results reported separately. The results were called to the [place] on 09/24/2017. 2. The carcinoma appears grade II. An E-Cadherin stain and a breast prognostic profile will be performed on part 2 and the results reported separately. (JBK:kh 09-24-17) [name] 1. HER2 Negative by FISH Estrogen receptor: 100% positive, strong staining Progesterone receptor: 80% positive, strong staining Ki67: 5% 2. HER2  Negative by FISH Estrogen receptor: 100% positive, strong staining Progesterone receptor: 0%, negative  Ki67: 30% 2. The tumor cells are strongly positive for E-cadherin, supporting a ductal phenotype    RADIOGRAPHIC STUDIES: I have personally reviewed the radiological images as listed and agreed with the findings in the report. No results found.   ASSESSMENT & PLAN: Darlene Beasley a 62 y.o.Caucasian femalewith history of substance usepresented with right nipple inversion for 3 months.    1. 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 Social support 3. Substance use 4. Insomnia 5. Nutrition, exercise 6. Elevate BP 7. Skin rash, possibly related to detergent with lysol vs taxol ?  Ms. Marrow appears stable. She completed cycle 1 weekly taxol, missed week 2 due to scheduling miscommunication. She tolerated taxol well overall without significant side effects. Clinical breast exam is stable. Plan to repeat imaging after 4 cycles of taxol. CBC and CMP reviewed, adequate for treatment.   BP continues to be elevated. I recommend anti-hypertensive, however she states she will not take it. She does agree to get BP cuff and monitor at home. I encouraged her to keep a log, checking BP same time every day or every other day after sitting 5-10 minutes. She agrees. Will f/u. I urged her to eat low-salt diet; she eats a lot of salty meals because that is what is served at work.   For skin rash, possibly related to new laundry detergent, I recommend to discontinue that product and use topical benadryl cream or hydrocortisone PRN for itching. Rash could possibly be related to taxol. Will monitor  closely.   PLAN: -Labs reviewed, proceed with cycle 2 taxol, continue weekly -Repeat imaging after cycle 4, will order next week -Lab, flush, f/u, and cycle 3 in 1 week  -monitor BP, reduce salt intake -topicals for skin rash   All questions were answered. The patient knows to call the clinic with any problems, questions or concerns. No barriers to learning was detected. I spent 20 minutes counseling the patient face to face. The total time spent in the appointment was 25 minutes and more than 50% was on counseling and review of test  results     Alla Feeling, NP 12/31/17

## 2017-12-31 NOTE — Patient Instructions (Signed)
[facility] Discharge Instructions for Patients Receiving Chemotherapy  Today you received the following chemotherapy agents:  Taxol.  To help prevent nausea and vomiting after your treatment, we encourage you to take your nausea medication as directed.   If you develop nausea and vomiting that is not controlled by your nausea medication, call the clinic.   BELOW ARE SYMPTOMS THAT SHOULD BE REPORTED IMMEDIATELY:  *FEVER GREATER THAN 100.5 F  *CHILLS WITH OR WITHOUT FEVER  NAUSEA AND VOMITING THAT IS NOT CONTROLLED WITH YOUR NAUSEA MEDICATION  *UNUSUAL SHORTNESS OF BREATH  *UNUSUAL BRUISING OR BLEEDING  TENDERNESS IN MOUTH AND THROAT WITH OR WITHOUT PRESENCE OF ULCERS  *URINARY PROBLEMS  *BOWEL PROBLEMS  UNUSUAL RASH Items with * indicate a potential emergency and should be followed up as soon as possible.  Feel free to call the clinic should you have any questions or concerns. The clinic phone number is [phone].  Please show the CHEMO ALERT CARD at check-in to the Emergency Department and triage nurse.   

## 2017-12-31 NOTE — Telephone Encounter (Signed)
Called patient to check in on her because she hasn't shown up for her appointment.  She states that her paper that she needs to be here at 12N.  When looking back I see she was originally scheduled for 12n for labs but was cancelled.   Notified Lacie and patient will still continue to come in for appointments.  Left scheduler a message in regards to the cancelled appointment.

## 2018-01-01 ENCOUNTER — Telehealth: Payer: Self-pay | Admitting: Hematology

## 2018-01-01 LAB — CANCER ANTIGEN 27.29: CA 27.29: 43.9 U/mL — ABNORMAL HIGH (ref 0.0–38.6)

## 2018-01-01 NOTE — Telephone Encounter (Signed)
No LOS 7/24

## 2018-01-07 ENCOUNTER — Inpatient Hospital Stay: Payer: Medicaid Other

## 2018-01-07 ENCOUNTER — Encounter: Payer: Self-pay | Admitting: Nurse Practitioner

## 2018-01-07 ENCOUNTER — Inpatient Hospital Stay (HOSPITAL_BASED_OUTPATIENT_CLINIC_OR_DEPARTMENT_OTHER): Payer: Medicaid Other | Admitting: Nurse Practitioner

## 2018-01-07 ENCOUNTER — Telehealth: Payer: Self-pay | Admitting: Hematology

## 2018-01-07 VITALS — BP 152/84 | HR 72 | Temp 98.4°F | Resp 18 | Ht 64.0 in | Wt 171.3 lb

## 2018-01-07 DIAGNOSIS — C50811 Malignant neoplasm of overlapping sites of right female breast: Secondary | ICD-10-CM

## 2018-01-07 DIAGNOSIS — Z170 Estrogen receptor positive status [ER+]: Secondary | ICD-10-CM

## 2018-01-07 DIAGNOSIS — G620 Drug-induced polyneuropathy: Secondary | ICD-10-CM

## 2018-01-07 DIAGNOSIS — C50412 Malignant neoplasm of upper-outer quadrant of left female breast: Secondary | ICD-10-CM

## 2018-01-07 DIAGNOSIS — R030 Elevated blood-pressure reading, without diagnosis of hypertension: Secondary | ICD-10-CM | POA: Diagnosis not present

## 2018-01-07 DIAGNOSIS — R21 Rash and other nonspecific skin eruption: Secondary | ICD-10-CM

## 2018-01-07 DIAGNOSIS — C773 Secondary and unspecified malignant neoplasm of axilla and upper limb lymph nodes: Secondary | ICD-10-CM

## 2018-01-07 DIAGNOSIS — Z5111 Encounter for antineoplastic chemotherapy: Secondary | ICD-10-CM | POA: Diagnosis not present

## 2018-01-07 DIAGNOSIS — Z95828 Presence of other vascular implants and grafts: Secondary | ICD-10-CM

## 2018-01-07 LAB — CMP (CANCER CENTER ONLY)
ALT: 15 U/L (ref 0–44)
AST: 14 U/L — ABNORMAL LOW (ref 15–41)
Albumin: 3.6 g/dL (ref 3.5–5.0)
Alkaline Phosphatase: 61 U/L (ref 38–126)
Anion gap: 6 (ref 5–15)
BILIRUBIN TOTAL: 0.3 mg/dL (ref 0.3–1.2)
BUN: 18 mg/dL (ref 8–23)
CHLORIDE: 107 mmol/L (ref 98–111)
CO2: 29 mmol/L (ref 22–32)
CREATININE: 0.7 mg/dL (ref 0.44–1.00)
Calcium: 9.2 mg/dL (ref 8.9–10.3)
GFR, Est AFR Am: >60 mL/min (ref >60)
Glucose, Bld: 95 mg/dL (ref 70–99)
POTASSIUM: 3.8 mmol/L (ref 3.5–5.1)
Sodium: 142 mmol/L (ref 135–145)
TOTAL PROTEIN: 6.1 g/dL — AB (ref 6.5–8.1)

## 2018-01-07 LAB — CBC WITH DIFFERENTIAL (CANCER CENTER ONLY)
Basophils Absolute: 0.1 10*3/uL (ref 0.0–0.1)
Basophils Relative: 1 %
EOS PCT: 3 %
Eosinophils Absolute: 0.1 10*3/uL (ref 0.0–0.5)
HCT: 32.2 % — ABNORMAL LOW (ref 34.8–46.6)
Hemoglobin: 10.8 g/dL — ABNORMAL LOW (ref 11.6–15.9)
LYMPHS ABS: 1.3 10*3/uL (ref 0.9–3.3)
LYMPHS PCT: 28 %
MCH: 32.7 pg (ref 25.1–34.0)
MCHC: 33.7 g/dL (ref 31.5–36.0)
MCV: 97.1 fL (ref 79.5–101.0)
MONO ABS: 0.3 10*3/uL (ref 0.1–0.9)
MONOS PCT: 7 %
NEUTROS ABS: 3 10*3/uL (ref 1.5–6.5)
Neutrophils Relative %: 61 %
PLATELETS: 212 10*3/uL (ref 145–400)
RBC: 3.31 MIL/uL — ABNORMAL LOW (ref 3.70–5.45)
RDW: 19.3 % — AB (ref 11.2–14.5)
WBC Count: 4.8 10*3/uL (ref 3.9–10.3)

## 2018-01-07 MED ORDER — DEXAMETHASONE SODIUM PHOSPHATE 100 MG/10ML IJ SOLN
20.0000 mg | Freq: Once | INTRAMUSCULAR | Status: AC
  Administered 2018-01-07: 20 mg via INTRAVENOUS
  Filled 2018-01-07: qty 2

## 2018-01-07 MED ORDER — FAMOTIDINE IN NACL 20-0.9 MG/50ML-% IV SOLN
20.0000 mg | Freq: Once | INTRAVENOUS | Status: AC
  Administered 2018-01-07: 20 mg via INTRAVENOUS

## 2018-01-07 MED ORDER — HEPARIN SOD (PORK) LOCK FLUSH 100 UNIT/ML IV SOLN
500.0000 [IU] | Freq: Once | INTRAVENOUS | Status: DC
Start: 2018-01-07 — End: 2018-01-07
  Filled 2018-01-07: qty 5

## 2018-01-07 MED ORDER — SODIUM CHLORIDE 0.9 % IV SOLN
Freq: Once | INTRAVENOUS | Status: AC
  Administered 2018-01-07: 13:00:00 via INTRAVENOUS
  Filled 2018-01-07: qty 250

## 2018-01-07 MED ORDER — SODIUM CHLORIDE 0.9 % IV SOLN
80.0000 mg/m2 | Freq: Once | INTRAVENOUS | Status: AC
  Administered 2018-01-07: 150 mg via INTRAVENOUS
  Filled 2018-01-07: qty 25

## 2018-01-07 MED ORDER — SODIUM CHLORIDE 0.9% FLUSH
10.0000 mL | INTRAVENOUS | Status: DC | PRN
Start: 2018-01-07 — End: 2018-01-07
  Administered 2018-01-07: 10 mL
  Filled 2018-01-07: qty 10

## 2018-01-07 MED ORDER — DIPHENHYDRAMINE HCL 50 MG/ML IJ SOLN
INTRAMUSCULAR | Status: AC
  Filled 2018-01-07: qty 1

## 2018-01-07 MED ORDER — DEXAMETHASONE SODIUM PHOSPHATE 10 MG/ML IJ SOLN
INTRAMUSCULAR | Status: AC
  Filled 2018-01-07: qty 2

## 2018-01-07 MED ORDER — HEPARIN SOD (PORK) LOCK FLUSH 100 UNIT/ML IV SOLN
500.0000 [IU] | Freq: Once | INTRAVENOUS | Status: AC | PRN
Start: 2018-01-07 — End: 2018-01-07
  Administered 2018-01-07: 500 [IU]
  Filled 2018-01-07: qty 5

## 2018-01-07 MED ORDER — DIPHENHYDRAMINE HCL 50 MG/ML IJ SOLN
50.0000 mg | Freq: Once | INTRAMUSCULAR | Status: AC
  Administered 2018-01-07: 50 mg via INTRAVENOUS

## 2018-01-07 MED ORDER — SODIUM CHLORIDE 0.9% FLUSH
10.0000 mL | Freq: Once | INTRAVENOUS | Status: AC
  Administered 2018-01-07: 10 mL
  Filled 2018-01-07: qty 10

## 2018-01-07 MED ORDER — FAMOTIDINE IN NACL 20-0.9 MG/50ML-% IV SOLN
INTRAVENOUS | Status: AC
  Filled 2018-01-07: qty 50

## 2018-01-07 NOTE — Patient Instructions (Signed)
[facility] Discharge Instructions for Patients Receiving Chemotherapy  Today you received the following chemotherapy agents: Paclitaxel (Taxol)  To help prevent nausea and vomiting after your treatment, we encourage you to take your nausea medication as prescribed. If you develop nausea and vomiting that is not controlled by your nausea medication, call the clinic.   BELOW ARE SYMPTOMS THAT SHOULD BE REPORTED IMMEDIATELY:  *FEVER GREATER THAN 100.5 F  *CHILLS WITH OR WITHOUT FEVER  NAUSEA AND VOMITING THAT IS NOT CONTROLLED WITH YOUR NAUSEA MEDICATION  *UNUSUAL SHORTNESS OF BREATH  *UNUSUAL BRUISING OR BLEEDING  TENDERNESS IN MOUTH AND THROAT WITH OR WITHOUT PRESENCE OF ULCERS  *URINARY PROBLEMS  *BOWEL PROBLEMS  UNUSUAL RASH Items with * indicate a potential emergency and should be followed up as soon as possible.  Feel free to call the clinic should you have any questions or concerns. The clinic phone number is [phone].  Please show the CHEMO ALERT CARD at check-in to the Emergency Department and triage nurse.   

## 2018-01-07 NOTE — Telephone Encounter (Signed)
No LOS 7/31

## 2018-01-07 NOTE — Progress Notes (Signed)
[place]  [phone]) [phone] [phone]) [phone]  Clinic Follow up Note   Patient Care Team: [name], MD as PCP - General (Family Medicine) [name], MD as Consulting Physician (General Surgery) [name], MD as Consulting Physician (Hematology) 01/07/2018  SUMMARY OF ONCOLOGIC HISTORY: Oncology History   Cancer Staging Cancer of overlapping sites of right female breast [facility]) Staging form: Breast, AJCC 8th Edition - Clinical stage from 09/23/2017: Stage IIIA (cT3, cN1, cM0, G2, ER+, PR-, HER2-) - Signed by [name], MD on 10/09/2017 - Clinical: cT3 - Unsigned       Cancer of overlapping sites of right female breast ([place])   09/09/2017 Mammogram    IMPRESSION: 1. Highly suspicious large right breast mass involving the outer and upper breast extending from approximately 9 o'clock through 12-1 o'clock, maximum measurement approximating 7.5 cm. The mass involves the right nipple, accounting for nipple retraction. Architectural distortion, microcalcifications and diffuse trabecular thickening and skin thickening are associated with the large mass, raising the possibility of this representing an inflammatory cancer. 2. Satellite masses separate from the dominant mass involving the lower outer quadrant and the upper inner quadrant of the right breast, measured above. 3. Two adjacent pathologic right axillary lymph nodes. 4. Indeterminate 0.9 cm solid mass involving the upper outer quadrant of the left breast which accounts for a mammographic finding. 5. No pathologic left axillary lymphadenopathy.      09/09/2017 Breast US    Targeted right breast ultrasound is performed, showing a very large hypoechoic mass with irregular margins extending from the approximate 9 o'clock position through the 12 to 1 o'clock position. On the CC gait image, the mass measures maximally approximately 7.5 cm. The mass has irregular margins, demonstrates acoustic shadowing, and  demonstrates internal power Doppler flow. The mass extends into the retroareolar region and involves the nipple, accounting for the nipple retraction.  Separate from the dominant mass at the 7 o'clock position approximately 4 cm from nipple is a hypoechoic antiparallel mass with irregular margins measuring approximately 1.4 x 2.7 x 1.7 cm.  Separate from the dominant mass at the 1 o'clock position approximately 2 cm from nipple is a hypoechoic mass with irregular margins measuring approximately 1.7 x 0.5 x 1.6 cm. Both of these masses also demonstrate internal power Doppler flow.  Sonographic evaluation of the right axilla demonstrates 2 adjacent pathologic lymph nodes, the larger measuring approximately 2.5 x 2.5 x 2.8 cm, the smaller measuring approximately 2.0 x 0.8 x 2.7 cm.      09/23/2017 Initial Biopsy    Diagnosis 1. Breast, left, needle core biopsy, 2 o'clock - INVASIVE DUCTAL CARCINOMA. - SEE COMMENT. 2. Breast, right, needle core biopsy, 11:30 o'clock - INVASIVE MAMMARY CARCINOMA. - SEE COMMENT. 3. Lymph node, needle/core biopsy, right axilla - METASTATIC MAMMARY CARCINOMA IN 1 OF 1 LYMPH NODE (1/1). Microscopic Comment 1. There is a small focus of grade I invasive ductal carcinoma. A complete breast prognostic profile will be attempted and the results reported separately. The results were called to the [facility] on 09/24/2017. 2. The carcinoma appears grade II. An E-Cadherin stain and a breast prognostic profile will be performed on part 2 and the results reported separately. (JBK:kh 09-24-17) [name]  1. HER2 Negative by FISH Estrogen receptor: 100% positive, strong staining Progesterone receptor: 80% positive, strong staining Ki67: 5%  2. HER2 Negative by FISH Estrogen receptor: 100% positive, strong staining Progesterone receptor: 0%, negative  Ki67: 30% 2. The tumor  cells are strongly positive for E-cadherin, supporting a ductal phenotype       09/23/2017 Cancer Staging    Staging form: Breast, AJCC 8th Edition - Clinical stage from 09/23/2017: Stage IIIA (cT3, cN1, cM0, G2, ER+, PR-, HER2-) - Signed by [name], MD on 10/09/2017      10/07/2017 Breast MRI    IMPRESSION: 1. Biopsy proven malignancy involving all 4 quadrants of the right breast, although predominantly located in the anterior third of the central upper right breast measures 6.8 x 9.6 x 7.2 cm.  2. Five pathologic right axillary lymph nodes, compatible with biopsy proven axillary metastases.  3. Small mass with associated biopsy related changes in the upper-outer left breast at site of known malignancy measures 0.9 x 0.8 x 0.7 cm. No abnormal lymph nodes seen in the left axilla.  RECOMMENDATION: Treatment plan for known bilateral breast malignancy.  BI-RADS CATEGORY  6: Known biopsy-proven malignancy.      10/08/2017 Initial Diagnosis    Cancer of overlapping sites of right female breast ([place])      10/13/2017 Pathology Results    Diagnosis 1. Breast, right, needle core biopsy, 1 o'clock - INVASIVE DUCTAL CARCINOMA. - SEE COMMENT. 2. Breast, right, needle core biopsy, 7 o'clock - INVASIVE DUCTAL CARCINOMA. - SEE COMMENT. Microscopic Comment 1. and 2. The carcinoma in parts 1 and 2 is morphologically similar and appears grade II. Because breast prognostic profiles were performed on the patients previous case, [id number], it will not be repeated on the current case unless requested.       10/17/2017 Imaging    CT CAP IMPRESSION: 1. Infiltrative right breast mass with overlying skin thickening is identified compatible with known breast cancer. 2. Enlarged right axillary lymph nodes compatible with metastatic adenopathy. 3. No additional sites of disease identified within the chest and no evidence for metastasis to the abdomen or pelvis. 4. Aortic atherosclerosis and 3 vessel coronary artery atherosclerotic calcifications. Aortic  Atherosclerosis (ICD10-I70.0).      10/17/2017 Imaging    Bone Scan FINDINGS: There are no foci of increased or decreased radiotracer uptake to suggest osseous metastatic disease. 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 There is degenerative type uptake within both shoulders and the left ankle.  Normal physiologic activity is identified within the kidneys and urinary bladder.  IMPRESSION: No evidence of osseous metastatic disease.      10/22/2017 -  Chemotherapy    AC q2 weeks x4 cycles starting 10/22/17-12/03/17, followed by weekly taxol x12 starting 12/17/17      12/16/2017 Breast US    IMPRESSION: 1. Stable to minimal decrease in size of multiple irregular masses throughout the right breast consistent with the patient's sites of biopsy proven malignancy. While the mammographic appearance is slightly less dense in this region, there is increased skin and trabecular thickening involving the entire right breast. 2. Stable appearance of morphologically abnormal right axillary lymph nodes, consistent with biopsy proven metastatic disease. 3. Stable appearance of a left breast mass, consistent with biopsy proven malignancy.        Malignant neoplasm of left breast in female, estrogen receptor positive ([place])   09/09/2017 Breast US    Targeted left breast ultrasound is performed, showing an oval parallel hypoechoic mass containing microcalcifications at the 2 o'clock position approximately 6 cm from nipple measuring approximately 0.9 x 0.5 x 0.9 cm, corresponding to the mammographic finding.  Sonographic evaluation of  the left axilla demonstrates no pathologic lymphadenopathy.      09/23/2017 Initial Biopsy    Diagnosis 1. Breast, left, needle core biopsy, 2 o'clock - INVASIVE DUCTAL CARCINOMA. - SEE COMMENT. 2. Breast, right, needle core biopsy, 11:30 o'clock - INVASIVE MAMMARY  CARCINOMA. - SEE COMMENT. 3. Lymph node, needle/core biopsy, right axilla - METASTATIC MAMMARY CARCINOMA IN 1 OF 1 LYMPH NODE (1/1). Microscopic Comment 1. There is a small focus of grade I invasive ductal carcinoma. A complete breast prognostic profile will be attempted and the results reported separately. The results were called to the [facility] on 09/24/2017. 2. The carcinoma appears grade II. An E-Cadherin stain and a breast prognostic profile will be performed on part 2 and the results reported separately. (JBK:kh 09-24-17) [name]  1. HER2 Negative by FISH Estrogen receptor: 100% positive, strong staining Progesterone receptor: 80% positive, strong staining Ki67: 5%  2. HER2 Negative by FISH Estrogen receptor: 100% positive, strong staining Progesterone receptor: 0%, negative  Ki67: 30%      10/08/2017 Initial Diagnosis    Malignant neoplasm of left breast in female, estrogen receptor positive ([place])      12/16/2017 Breast US    IMPRESSION: 3. Stable appearance of a left breast mass, consistent with biopsy proven malignancy.      CURRENT THERAPY : Neoadjuvant chemo AC q2 weeks x4 cycles starting 10/22/17, followed by weekly taxol x12  INTERVAL HISTORY: Ms. Zabinski returns for follow up and cycle 3 taxol as scheduled. She completed cycle 2 on 12/31/17. She felt hot and sweaty during last infusion, but tolerated the whole infusion without further difficulty. She continues to work full time. Appetite is normal. Denies nausea, vomiting, constipation, or diarrhea. Intermittent tingling to fingertips is worse in some fingers, none in her feet. She has not used cryotherapy or started B complex vitamin. Denies fever, chills, cough, chest pain, or dyspnea.   REVIEW OF SYSTEMS:   Constitutional: Denies fevers, chills or abnormal weight loss Ears, nose, mouth, throat, and face: Denies mucositis or sore throat Respiratory: Denies cough, dyspnea or wheezes Cardiovascular:  Denies palpitation, chest discomfort or lower extremity swelling Gastrointestinal:  Denies nausea, vomiting, constipation, diarrhea, heartburn or change in bowel habits Skin: Denies abnormal skin rashes Lymphatics: Denies new lymphadenopathy or easy bruising Neurological:Denies numbness or new weaknesses (+) intermittent tingling to fingertips  Behavioral/Psych: Mood is stable, no new changes  All other systems were reviewed with the patient and are negative.  MEDICAL HISTORY:  Past Medical History:  Diagnosis Date  . Adopted   . Arthritis   . breast ca dx'd 08/2017  . Genetic testing 11/14/2017   Multi-Cancer panel (83 genes) @ [facility] - Pathogenic mutation in the MITF gene  . GERD (gastroesophageal reflux disease)   . 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 IR IMAGING GUIDED PORT INSERTION  10/20/2017  . IR US GUIDE VASC ACCESS LEFT  10/20/2017  . TONSILLECTOMY Bilateral   . TUBAL LIGATION      I have reviewed the social history and family history with the patient and they are unchanged from previous note.  ALLERGIES:  is allergic to bee venom.  MEDICATIONS:  Current Outpatient Medications  Medication Sig Dispense Refill  . Coenzyme Q10 100 MG capsule Take 100 mg by mouth daily.     [name] lidocaine-prilocaine (EMLA) cream Apply to affected area once  30 g 3  . ondansetron (ZOFRAN) 8 MG tablet Take 1 tablet (8 mg total) by mouth 2 (two) times daily as needed. Start on the third day after chemotherapy. 30 tablet 1  . prochlorperazine (COMPAZINE) 10 MG tablet Take 1 tablet (10 mg total) by mouth every 6 (six) hours as needed (Nausea or vomiting). 60 tablet 1   No current facility-administered medications for this visit.    Facility-Administered Medications Ordered in Other Visits  Medication Dose Route Frequency Provider Last Rate Last Dose  .  sodium chloride flush (NS) 0.9 % injection 10 mL  10 mL Intracatheter PRN [name], MD   10 mL at 01/07/18 1549    PHYSICAL EXAMINATION: ECOG PERFORMANCE STATUS: 1 - Symptomatic but completely ambulatory  Vitals:   01/07/18 1156  BP: (!) 152/84  Pulse: 72  Resp: 18  Temp: 98.4 F (36.9 C)  SpO2: 98%   Filed Weights   01/07/18 1156  Weight: 171 lb 4.8 oz (77.7 kg)    GENERAL:alert, no distress and comfortable SKIN: no rashes or significant lesions EYES: sclera clear OROPHARYNX:no thrush or ulcers    LYMPH:  no palpable cervical or supraclavicular lymphadenopathy LUNGS: clear to auscultation with normal breathing effort HEART: regular rate & rhythm and no murmurs and no lower extremity edema ABDOMEN:abdomen soft, non-tender and normal bowel sounds Musculoskeletal:no cyanosis of digits and no clubbing  NEURO: alert & oriented x 3 with fluent speech, no focal motor/sensory deficits Breast exam: right axillary LN is difficult to palpate, approx 0.5 cm. The right breast mass is essentially unchanged, 7x6 cm today, but softer. There is skin thickening and edema in the inner quadrant. No palpable mass in the left breast or axilla.  PAC without erythema    LABORATORY DATA:  I have reviewed the data as listed CBC Latest Ref Rng & Units 01/07/2018 12/31/2017 12/17/2017  WBC 3.9 - 10.3 K/uL 4.8 6.2 18.8(H)  Hemoglobin 11.6 - 15.9 g/dL 10.8(L) 10.8(L) 10.0(L)  Hematocrit 34.8 - 46.6 % 32.2(L) 32.1(L) 30.4(L)  Platelets 145 - 400 K/uL 212 221 208     CMP Latest Ref Rng & Units 01/07/2018 12/31/2017 12/17/2017  Glucose 70 - 99 mg/dL 95 93 119(H)  BUN 8 - 23 mg/dL '18 23 18  '$ Creatinine 0.44 - 1.00 mg/dL 0.70 0.70 0.79  Sodium 135 - 145 mmol/L 142 140 143  Potassium 3.5 - 5.1 mmol/L 3.8 3.9 3.5  Chloride 98 - 111 mmol/L 107 108 107  CO2 22 - 32 mmol/L '29 25 28  '$ Calcium 8.9 - 10.3 mg/dL 9.2 9.0 9.3  Total Protein 6.5 - 8.1 g/dL 6.1(L) 6.3(L) 6.2(L)  Total Bilirubin 0.3 - 1.2 mg/dL 0.3  0.3 <0.2(L)  Alkaline Phos 38 - 126 U/L 61 72 113  AST 15 - 41 U/L 14(L) 14(L) 10(L)  ALT 0 - 44 U/L '15 13 13    '$ PATHOLOGY   Diagnosis 10/13/17 1. Breast, right, needle core biopsy, 1 o'clock - INVASIVE DUCTAL CARCINOMA. - SEE COMMENT. 2. Breast, right, needle core biopsy, 7 o'clock - INVASIVE DUCTAL CARCINOMA. - SEE COMMENT. Microscopic Comment 1. and 2. The carcinoma in parts 1 and 2 is morphologically similar and appears grade II. Because breast prognostic profiles were performed on the patients previous case, [id number], it will not be repeated on the current case unless requested.     Diagnosis 09/23/17 1. Breast, left, needle core biopsy, 2 o'clock - INVASIVE DUCTAL CARCINOMA. - SEE COMMENT. 2. Breast, right, needle core biopsy, 11:30 o'clock -  INVASIVE MAMMARY CARCINOMA. - SEE COMMENT. 3. Lymph node, needle/core biopsy, right axilla - METASTATIC MAMMARY CARCINOMA IN 1 OF 1 LYMPH NODE (1/1). Microscopic Comment 1. There is a small focus of grade I invasive ductal carcinoma. A complete breast prognostic profile will be attempted and the results reported separately. The results were called to the [facility] on 09/24/2017. 2. The carcinoma appears grade II. An E-Cadherin stain and a breast prognostic profile will be performed on part 2 and the results reported separately. (JBK:kh 09-24-17) [name] 1. HER2 Negative by FISH Estrogen receptor: 100% positive, strong staining Progesterone receptor: 80% positive, strong staining Ki67: 5% 2. HER2 Negative by FISH Estrogen receptor: 100% positive, strong staining Progesterone receptor: 0%, negative  Ki67: 30% 2. The tumor cells are strongly positive for E-cadherin, supporting a ductal phenotype     RADIOGRAPHIC STUDIES: I have personally reviewed the radiological images as listed and agreed with the findings in the report. No results found.   ASSESSMENT & PLAN: Mikela Senn Dixonis a 62  y.o.Caucasian femalewith history of substance usepresented with right nipple inversion for 3 months.    1. 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 Social support 3. Substance use 4. Insomnia 5. Nutrition, exercise 6. Elevate BP 7. Skin rash, possibly related to detergent with lysol vs taxol ? 8. 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 Usman appears stable. She completed 2 cycles neoadjuvant taxol. She is tolerating treatment very well overall. The right axillary node is slightly smaller, the right breast mass is essentially unchanged. Plan to repeat MRI in 2 more cycles. I ordered today. She will begin B complex vitamin for grade 1 neuropathy. Labs reviewed, CBC and CMP adequate to proceed with cycle 3 taxol today. She will return in 1 week for f/u and next cycle.   PLAN: -Labs reviewed, proceed with cycle 3 weekly taxol -MRI in 2 weeks, after cycle 4, ordered today -F/u in 1 week with cycle 4   Orders Placed This Encounter  Procedures  . MR BREAST BILATERAL W WO CONTRAST INC CAD    Standing Status:   Future    Standing Expiration Date:   03/10/2019    Order Specific Question:   ** REASON FOR EXAM (FREE TEXT)    Answer:   evaluate response to neoadjuvant chemotherapy    Order Specific Question:   If indicated for the ordered procedure, I authorize the administration of contrast media per Radiology protocol    Answer:   Yes    Order Specific Question:   What is the patient's sedation requirement?    Answer:   No Sedation    Order Specific Question:   Does the patient have a pacemaker or implanted devices?    Answer:   No    Order Specific Question:    Radiology Contrast Protocol - do NOT remove file path    Answer:   [email or web address]    Order Specific Question:   Preferred imaging location?    Answer:   [facility] (table limit-350 lbs)   All questions were answered. The patient knows to call the clinic with any problems, questions or concerns. No barriers to learning was detected.  Alla Feeling, NP 01/07/18

## 2018-01-13 ENCOUNTER — Telehealth: Payer: Self-pay | Admitting: Emergency Medicine

## 2018-01-13 NOTE — Telephone Encounter (Addendum)
MRI scheduled. Multiple attempts on patients cell phone w/ no answer or VM set up. Called emergency contact and pts employer. Will try again.   ----- Message from Alla Feeling, NP sent at 01/13/2018  2:38 PM EDT ----- Please help to schedule breast MRI after 8/7 but before 8/14, thanks [place]

## 2018-01-14 ENCOUNTER — Inpatient Hospital Stay: Payer: Medicaid Other

## 2018-01-14 ENCOUNTER — Inpatient Hospital Stay: Payer: Medicaid Other | Attending: Nurse Practitioner

## 2018-01-14 ENCOUNTER — Inpatient Hospital Stay (HOSPITAL_BASED_OUTPATIENT_CLINIC_OR_DEPARTMENT_OTHER): Payer: Medicaid Other | Admitting: Hematology

## 2018-01-14 ENCOUNTER — Encounter: Payer: Self-pay | Admitting: Hematology

## 2018-01-14 ENCOUNTER — Telehealth: Payer: Self-pay | Admitting: Emergency Medicine

## 2018-01-14 VITALS — BP 165/92 | HR 74 | Temp 98.1°F | Resp 17 | Ht 64.0 in | Wt 169.2 lb

## 2018-01-14 DIAGNOSIS — Z170 Estrogen receptor positive status [ER+]: Secondary | ICD-10-CM | POA: Diagnosis not present

## 2018-01-14 DIAGNOSIS — C50811 Malignant neoplasm of overlapping sites of right female breast: Secondary | ICD-10-CM

## 2018-01-14 DIAGNOSIS — C773 Secondary and unspecified malignant neoplasm of axilla and upper limb lymph nodes: Secondary | ICD-10-CM

## 2018-01-14 DIAGNOSIS — G4700 Insomnia, unspecified: Secondary | ICD-10-CM

## 2018-01-14 DIAGNOSIS — I429 Cardiomyopathy, unspecified: Secondary | ICD-10-CM | POA: Insufficient documentation

## 2018-01-14 DIAGNOSIS — C50412 Malignant neoplasm of upper-outer quadrant of left female breast: Secondary | ICD-10-CM

## 2018-01-14 DIAGNOSIS — G620 Drug-induced polyneuropathy: Secondary | ICD-10-CM | POA: Diagnosis not present

## 2018-01-14 DIAGNOSIS — I10 Essential (primary) hypertension: Secondary | ICD-10-CM | POA: Diagnosis not present

## 2018-01-14 DIAGNOSIS — F17210 Nicotine dependence, cigarettes, uncomplicated: Secondary | ICD-10-CM

## 2018-01-14 DIAGNOSIS — C50912 Malignant neoplasm of unspecified site of left female breast: Secondary | ICD-10-CM

## 2018-01-14 DIAGNOSIS — Z9221 Personal history of antineoplastic chemotherapy: Secondary | ICD-10-CM | POA: Diagnosis not present

## 2018-01-14 DIAGNOSIS — Z5111 Encounter for antineoplastic chemotherapy: Secondary | ICD-10-CM | POA: Diagnosis not present

## 2018-01-14 DIAGNOSIS — Z95828 Presence of other vascular implants and grafts: Secondary | ICD-10-CM

## 2018-01-14 LAB — CMP (CANCER CENTER ONLY)
ALT: 16 U/L (ref 0–44)
ANION GAP: 8 (ref 5–15)
AST: 15 U/L (ref 15–41)
Albumin: 3.6 g/dL (ref 3.5–5.0)
Alkaline Phosphatase: 56 U/L (ref 38–126)
BUN: 22 mg/dL (ref 8–23)
CHLORIDE: 108 mmol/L (ref 98–111)
CO2: 25 mmol/L (ref 22–32)
CREATININE: 0.73 mg/dL (ref 0.44–1.00)
Calcium: 8.7 mg/dL — ABNORMAL LOW (ref 8.9–10.3)
GFR, Est AFR Am: >60 mL/min (ref >60)
Glucose, Bld: 99 mg/dL (ref 70–99)
POTASSIUM: 3.9 mmol/L (ref 3.5–5.1)
SODIUM: 141 mmol/L (ref 135–145)
Total Bilirubin: 0.4 mg/dL (ref 0.3–1.2)
Total Protein: 6.1 g/dL — ABNORMAL LOW (ref 6.5–8.1)

## 2018-01-14 LAB — CBC WITH DIFFERENTIAL (CANCER CENTER ONLY)
Basophils Absolute: 0 10*3/uL (ref 0.0–0.1)
Basophils Relative: 1 %
EOS ABS: 0.2 10*3/uL (ref 0.0–0.5)
EOS PCT: 4 %
HCT: 33.4 % — ABNORMAL LOW (ref 34.8–46.6)
Hemoglobin: 11.1 g/dL — ABNORMAL LOW (ref 11.6–15.9)
LYMPHS ABS: 1.5 10*3/uL (ref 0.9–3.3)
LYMPHS PCT: 29 %
MCH: 32.6 pg (ref 25.1–34.0)
MCHC: 33.2 g/dL (ref 31.5–36.0)
MCV: 97.9 fL (ref 79.5–101.0)
MONO ABS: 0.2 10*3/uL (ref 0.1–0.9)
Monocytes Relative: 4 %
Neutro Abs: 3.2 10*3/uL (ref 1.5–6.5)
Neutrophils Relative %: 62 %
PLATELETS: 163 10*3/uL (ref 145–400)
RBC: 3.41 MIL/uL — ABNORMAL LOW (ref 3.70–5.45)
RDW: 17.3 % — AB (ref 11.2–14.5)
WBC Count: 5.2 10*3/uL (ref 3.9–10.3)

## 2018-01-14 MED ORDER — SODIUM CHLORIDE 0.9 % IV SOLN
Freq: Once | INTRAVENOUS | Status: AC
  Administered 2018-01-14: 15:00:00 via INTRAVENOUS
  Filled 2018-01-14: qty 250

## 2018-01-14 MED ORDER — SODIUM CHLORIDE 0.9% FLUSH
10.0000 mL | Freq: Once | INTRAVENOUS | Status: AC
  Administered 2018-01-14: 10 mL
  Filled 2018-01-14: qty 10

## 2018-01-14 MED ORDER — SODIUM CHLORIDE 0.9 % IV SOLN
80.0000 mg/m2 | Freq: Once | INTRAVENOUS | Status: AC
  Administered 2018-01-14: 150 mg via INTRAVENOUS
  Filled 2018-01-14: qty 25

## 2018-01-14 MED ORDER — DIPHENHYDRAMINE HCL 50 MG/ML IJ SOLN
50.0000 mg | Freq: Once | INTRAMUSCULAR | Status: AC
  Administered 2018-01-14: 50 mg via INTRAVENOUS

## 2018-01-14 MED ORDER — FAMOTIDINE IN NACL 20-0.9 MG/50ML-% IV SOLN
INTRAVENOUS | Status: AC
  Filled 2018-01-14: qty 50

## 2018-01-14 MED ORDER — SODIUM CHLORIDE 0.9 % IV SOLN
20.0000 mg | Freq: Once | INTRAVENOUS | Status: AC
  Administered 2018-01-14: 20 mg via INTRAVENOUS
  Filled 2018-01-14: qty 2

## 2018-01-14 MED ORDER — FAMOTIDINE IN NACL 20-0.9 MG/50ML-% IV SOLN
20.0000 mg | Freq: Once | INTRAVENOUS | Status: AC
  Administered 2018-01-14: 20 mg via INTRAVENOUS

## 2018-01-14 MED ORDER — DIPHENHYDRAMINE HCL 50 MG/ML IJ SOLN
INTRAMUSCULAR | Status: AC
Start: 2018-01-14 — End: ?
  Filled 2018-01-14: qty 1

## 2018-01-14 MED ORDER — SODIUM CHLORIDE 0.9% FLUSH
10.0000 mL | INTRAVENOUS | Status: DC | PRN
  Administered 2018-01-14: 10 mL
  Filled 2018-01-14: qty 10

## 2018-01-14 MED ORDER — HEPARIN SOD (PORK) LOCK FLUSH 100 UNIT/ML IV SOLN
500.0000 [IU] | Freq: Once | INTRAVENOUS | Status: AC | PRN
Start: 2018-01-14 — End: 2018-01-14
  Administered 2018-01-14: 500 [IU]
  Filled 2018-01-14: qty 5

## 2018-01-14 NOTE — Patient Instructions (Signed)
[facility] Discharge Instructions for Patients Receiving Chemotherapy  Today you received the following chemotherapy agents: Paclitaxel (Taxol)  To help prevent nausea and vomiting after your treatment, we encourage you to take your nausea medication as prescribed. If you develop nausea and vomiting that is not controlled by your nausea medication, call the clinic.   BELOW ARE SYMPTOMS THAT SHOULD BE REPORTED IMMEDIATELY:  *FEVER GREATER THAN 100.5 F  *CHILLS WITH OR WITHOUT FEVER  NAUSEA AND VOMITING THAT IS NOT CONTROLLED WITH YOUR NAUSEA MEDICATION  *UNUSUAL SHORTNESS OF BREATH  *UNUSUAL BRUISING OR BLEEDING  TENDERNESS IN MOUTH AND THROAT WITH OR WITHOUT PRESENCE OF ULCERS  *URINARY PROBLEMS  *BOWEL PROBLEMS  UNUSUAL RASH Items with * indicate a potential emergency and should be followed up as soon as possible.  Feel free to call the clinic should you have any questions or concerns. The clinic phone number is [phone].  Please show the CHEMO ALERT CARD at check-in to the Emergency Department and triage nurse.   

## 2018-01-14 NOTE — Progress Notes (Signed)
[place]  [phone] [phone]  Clinic Follow Up Note   Patient Care Team: [name], MD as PCP - General (Family Medicine) [name], MD as Consulting Physician (General Surgery) [name], MD as Consulting Physician (Hematology)   Date of Service:  01/14/2018  CHIEF COMPLAINTS:  Follow up for Bilateral Breast Cancer   Oncology History   Cancer Staging Cancer of overlapping sites of right female breast [facility]) Staging form: Breast, AJCC 8th Edition - Clinical stage from 09/23/2017: Stage IIIA (cT3, cN1, cM0, G2, ER+, PR-, HER2-) - Signed by [name], MD on 10/09/2017 - Clinical: cT3 - Unsigned       Cancer of overlapping sites of right female breast ([place])   09/09/2017 Mammogram    IMPRESSION: 1. Highly suspicious large right breast mass involving the outer and upper breast extending from approximately 9 o'clock through 12-1 o'clock, maximum measurement approximating 7.5 cm. The mass involves the right nipple, accounting for nipple retraction. Architectural distortion, microcalcifications and diffuse trabecular thickening and skin thickening are associated with the large mass, raising the possibility of this representing an inflammatory cancer. 2. Satellite masses separate from the dominant mass involving the lower outer quadrant and the upper inner quadrant of the right breast, measured above. 3. Two adjacent pathologic right axillary lymph nodes. 4. Indeterminate 0.9 cm solid mass involving the upper outer quadrant of the left breast which accounts for a mammographic finding. 5. No pathologic left axillary lymphadenopathy.      09/09/2017 Breast US    Targeted right breast ultrasound is performed, showing a very large hypoechoic mass with irregular margins extending from the approximate 9 o'clock position through the 12 to 1 o'clock position. On the CC gait image, the mass measures maximally approximately 7.5 cm. The mass has  irregular margins, demonstrates acoustic shadowing, and demonstrates internal power Doppler flow. The mass extends into the retroareolar region and involves the nipple, accounting for the nipple retraction.  Separate from the dominant mass at the 7 o'clock position approximately 4 cm from nipple is a hypoechoic antiparallel mass with irregular margins measuring approximately 1.4 x 2.7 x 1.7 cm.  Separate from the dominant mass at the 1 o'clock position approximately 2 cm from nipple is a hypoechoic mass with irregular margins measuring approximately 1.7 x 0.5 x 1.6 cm. Both of these masses also demonstrate internal power Doppler flow.  Sonographic evaluation of the right axilla demonstrates 2 adjacent pathologic lymph nodes, the larger measuring approximately 2.5 x 2.5 x 2.8 cm, the smaller measuring approximately 2.0 x 0.8 x 2.7 cm.      09/23/2017 Initial Biopsy    Diagnosis 1. Breast, left, needle core biopsy, 2 o'clock - INVASIVE DUCTAL CARCINOMA. - SEE COMMENT. 2. Breast, right, needle core biopsy, 11:30 o'clock - INVASIVE MAMMARY CARCINOMA. - SEE COMMENT. 3. Lymph node, needle/core biopsy, right axilla - METASTATIC MAMMARY CARCINOMA IN 1 OF 1 LYMPH NODE (1/1). Microscopic Comment 1. There is a small focus of grade I invasive ductal carcinoma. A complete breast prognostic profile will be attempted and the results reported separately. The results were called to the [facility] on 09/24/2017. 2. The carcinoma appears grade II. An E-Cadherin stain and a breast prognostic profile will be performed on part 2 and the results reported separately. (JBK:kh 09-24-17) [name]  1. HER2 Negative by FISH Estrogen receptor: 100% positive, strong staining Progesterone receptor: 80% positive, strong staining Ki67: 5%  2. HER2 Negative by FISH Estrogen receptor: 100%  positive, strong staining Progesterone receptor: 0%, negative  Ki67: 30% 2. The tumor cells are strongly  positive for E-cadherin, supporting a ductal phenotype      09/23/2017 Cancer Staging    Staging form: Breast, AJCC 8th Edition - Clinical stage from 09/23/2017: Stage IIIA (cT3, cN1, cM0, G2, ER+, PR-, HER2-) - Signed by [name], MD on 10/09/2017      10/07/2017 Breast MRI    IMPRESSION: 1. Biopsy proven malignancy involving all 4 quadrants of the right breast, although predominantly located in the anterior third of the central upper right breast measures 6.8 x 9.6 x 7.2 cm.  2. Five pathologic right axillary lymph nodes, compatible with biopsy proven axillary metastases.  3. Small mass with associated biopsy related changes in the upper-outer left breast at site of known malignancy measures 0.9 x 0.8 x 0.7 cm. No abnormal lymph nodes seen in the left axilla.  RECOMMENDATION: Treatment plan for known bilateral breast malignancy.  BI-RADS CATEGORY  6: Known biopsy-proven malignancy.      10/08/2017 Initial Diagnosis    Cancer of overlapping sites of right female breast ([place])      10/13/2017 Pathology Results    Diagnosis 1. Breast, right, needle core biopsy, 1 o'clock - INVASIVE DUCTAL CARCINOMA. - SEE COMMENT. 2. Breast, right, needle core biopsy, 7 o'clock - INVASIVE DUCTAL CARCINOMA. - SEE COMMENT. Microscopic Comment 1. and 2. The carcinoma in parts 1 and 2 is morphologically similar and appears grade II. Because breast prognostic profiles were performed on the patients previous case, [id number], it will not be repeated on the current case unless requested.       10/17/2017 Imaging    CT CAP IMPRESSION: 1. Infiltrative right breast mass with overlying skin thickening is identified compatible with known breast cancer. 2. Enlarged right axillary lymph nodes compatible with metastatic adenopathy. 3. No additional sites of disease identified within the chest and no evidence for metastasis to the abdomen or pelvis. 4. Aortic atherosclerosis and 3 vessel coronary  artery atherosclerotic calcifications. Aortic Atherosclerosis (ICD10-I70.0).      10/17/2017 Imaging    Bone Scan FINDINGS: There are no foci of increased or decreased radiotracer uptake to suggest osseous metastatic disease. 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 There is degenerative type uptake within both shoulders and the left ankle.  Normal physiologic activity is identified within the kidneys and urinary bladder.  IMPRESSION: No evidence of osseous metastatic disease.      10/22/2017 -  Chemotherapy    AC q2 weeks x4 cycles starting 10/22/17-12/03/17, followed by weekly taxol x12 starting 12/17/17      11/14/2017 Genetic Testing    The genes that were analyzed were the 83 genes on [facility]'s Multi-Cancer panel (ALK, APC, ATM, AXIN2, BAP1, BARD1, BLM, BMPR1A, BRCA1, BRCA2, BRIP1, CASR, CDC73, CDH1, CDK4, CDKN1B, CDKN1C, CDKN2A, CEBPA, CHEK2, CTNNA1, DICER1, DIS3L2, EGFR, EPCAM, FH, FLCN, GATA2, GPC3, GREM1, HOXB13, HRAS, KIT, MAX, MEN1, MET, MITF, MLH1, MSH2, MSH3, MSH6, MUTYH, NBN, NF1, NF2, NTHL1, PALB2, PDGFRA, PHOX2B, PMS2, POLD1, POLE, POT1, PRKAR1A, PTCH1, PTEN, RAD50, RAD51C, RAD51D, RB1, RECQL4, RET, RUNX1, SDHA, SDHAF2, SDHB, SDHC, SDHD, SMAD4, SMARCA4, SMARCB1, SMARCE1, STK11, SUFU, TERC, TERT, TMEM127, TP53, TSC1, TSC2, VHL, WRN, WT1).  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      12/16/2017 Breast US  IMPRESSION: 1. Stable to minimal decrease in size of multiple irregular masses throughout the right breast consistent with the patient's sites of biopsy proven malignancy. While the mammographic appearance is slightly less dense in this region, there is increased skin and trabecular thickening involving the entire right breast. 2. Stable  appearance of morphologically abnormal right axillary lymph nodes, consistent with biopsy proven metastatic disease. 3. Stable appearance of a left breast mass, consistent with biopsy proven malignancy.        Malignant neoplasm of left breast in female, estrogen receptor positive ([place])   09/09/2017 Breast US    Targeted left breast ultrasound is performed, showing an oval parallel hypoechoic mass containing microcalcifications at the 2 o'clock position approximately 6 cm from nipple measuring approximately 0.9 x 0.5 x 0.9 cm, corresponding to the mammographic finding.  Sonographic evaluation of the left axilla demonstrates no pathologic lymphadenopathy.      09/23/2017 Initial Biopsy    Diagnosis 1. Breast, left, needle core biopsy, 2 o'clock - INVASIVE DUCTAL CARCINOMA. - SEE COMMENT. 2. Breast, right, needle core biopsy, 11:30 o'clock - INVASIVE MAMMARY CARCINOMA. - SEE COMMENT. 3. Lymph node, needle/core biopsy, right axilla - METASTATIC MAMMARY CARCINOMA IN 1 OF 1 LYMPH NODE (1/1). Microscopic Comment 1. There is a small focus of grade I invasive ductal carcinoma. A complete breast prognostic profile will be attempted and the results reported separately. The results were called to the [facility] on 09/24/2017. 2. The carcinoma appears grade II. An E-Cadherin stain and a breast prognostic profile will be performed on part 2 and the results reported separately. (JBK:kh 09-24-17) [name]  1. HER2 Negative by FISH Estrogen receptor: 100% positive, strong staining Progesterone receptor: 80% positive, strong staining Ki67: 5%  2. HER2 Negative by FISH Estrogen receptor: 100% positive, strong staining Progesterone receptor: 0%, negative  Ki67: 30%      10/08/2017 Initial Diagnosis    Malignant neoplasm of left breast in female, estrogen receptor positive ([place])      12/16/2017 Breast US    IMPRESSION: 3. Stable appearance of a left breast mass, consistent with  biopsy proven malignancy.        HISTORY OF PRESENTING ILLNESS:   Darlene Beasley 62 y.o. female is here because of newly diagnosed bilateral breast cancer. In 03/2017 she "pinched" herself at work and developed a right breast bruise and soreness which finally resolved in 04/2017. In January 2019 she noticed right nipple inversion. She researched online and made plans to have a mammogram, last done 30 years ago. She had a palpable breast 'lump" which she felt after her son was born 46 years ago that was never biopsied and eventually resolved. She reports intentional weight loss.   Imaging revealed a large right breast mass involving the upper outer quadrant extending from 9 o'clock to 12-1 o'clock, measuring approximately 7.5 cm, involving the right nipple. There is a satellite lesion at the 7 o'clock position approximately 4 cm from the nipple with irregular margins measuring 1.4 x 2.7, x 1.7 cm. Additional satellite lesion at 1 o'clock position approximately 2 cm from the nipple measures 1.7 x 0.5 x 1.6 cm. [place] of right axilla reveals 2 adjacent pathologic lymph nodes, measuring 2.5 x 2.5 x 2.8 cm and 2.0 x 0.8 x 2.7 cm. Biopsy of the right breast revealed invasive mammary carcinoma, tumor cells are strongly positive for E-cadherin, supporting a ductal phenotype; grade II, HER2 negative, ER positive, PR negative, metastatic to right axilla. Targeted left  breast US reveals hypoechoic mass at the 2 o'clock position approximately 6 cm from the nipple measuring 0.9 x 0.5 x 0.9 fm. No suspicious adenopathy in the left axilla. Biopsy of the left breast revealed invasive ductal carcinoma, grade I, HER2 negative, ER/PR positive. Breast MRI revealed biopsy-proven malignancy involving all 4 quadrants of the right breast, predominantly in the anterior third of the central upper right breast measuring 6.8 x 9.6 x 7.2 cm with 5 pathologic right axillary lymph nodes, compatible with biopsy-proven axillary metastases.  In the left breast there is a small mass with associated biopsy related changes in the upper outer left breast at site of known malignancy, no abnormal axillary lymph nodes.   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 Smokes marijuana daily since age 31 to control arthritic pain. Had GERD in the past. She has smoked 1-2 PPD cigarettes x51 years, currently weaning. She quit drinking alcohol when she changed her diet and began losing weight in 08/2017. She has a significant other with whom she resides. She is independent of all ADLs. She is adopted, family history unknown.  Today she feels well, she has lingering cough from recent URI. No fever or chills. She is fatigued at baseline due to busy job, she works 14-hour days, 60 hours per week, working at [employer] in [place]. She tolerated breast biopsy well with mild bruising and swelling.   GYN HISTORY  Menarchal: age 49 LMP: age 46-50 Contraceptive: age [age] OCP HRT: None G1P1   CURRENT THERAPY : Neoadjuvant chemo AC q2 weeks x4 cycles starting 10/22/17-12/03/17, followed by weekly taxol x12 starting 12/17/17  INTERVAL HISTORY   Darlene Beasley is here for a follow up and cycle 8 Taxol. She presents to the clinic today by herself. She notes she is tolerating taxol well overall. She has sweating during infusion. She notes neuropathy in her hands but not in her toes or feet. She is on Vitamin B12. She notes her right nipple relaxed and was no longer inverted. This inversion occurred after her diagnosis.  She notes her genetic results show she is predisposed to melanoma.     MEDICAL HISTORY:  Past Medical History:  Diagnosis Date  . Adopted   . Arthritis   . breast ca dx'd 08/2017  . Genetic testing 11/14/2017   Multi-Cancer panel (83 genes) @ [facility] - Pathogenic mutation in the MITF gene  . GERD (gastroesophageal reflux disease)   . 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 IR IMAGING GUIDED PORT INSERTION  10/20/2017  . IR US GUIDE VASC ACCESS LEFT  10/20/2017  . TONSILLECTOMY Bilateral   . TUBAL LIGATION      SOCIAL HISTORY: Social History   Socioeconomic History  . Marital status: Significant Other    Spouse name: Not on file  . Number of children: 1  . Years of education: Not on file  . Highest education level: Not on file  Occupational History  . Not on file  Social Needs  . Financial resource strain: Not on file  . Food insecurity:    Worry: Not on file    Inability: Not on file  . Transportation needs:    Medical: Not on file    Non-medical: Not on file  Tobacco Use  . Smoking status: Current Every Day Smoker    Packs/day:  0.50    Years: 51.00    Pack years: 25.50    Types: Cigarettes  . Smokeless tobacco: Never Used  . Tobacco comment: Working on quitting.  Substance and Sexual Activity  . Alcohol use: No    Comment: stopped 08/2017   . Drug use: Yes    Types: Marijuana    Comment: daily, since age 73   . Sexual activity: Not Currently  Lifestyle  . Physical activity:    Days per week: 7 days    Minutes per session: 80 min  . Stress: Very much  Relationships  . Social connections:    Talks on phone: Never    Gets together: Never    Attends religious service: Never    Active member of club or organization: No    Attends meetings of clubs or organizations: Never    Relationship status: Living with partner  . Intimate partner violence:    Fear of current or ex partner: No    Emotionally abused: No    Physically abused: No    Forced sexual activity: No  Other Topics Concern  . Not on file  Social History Narrative  . Not on file    FAMILY HISTORY: Family History  Adopted: Yes  Problem Relation Age of Onset  . Congestive Heart Failure Mother   . Congestive Heart Failure Father     ALLERGIES:  is  allergic to bee venom.  MEDICATIONS:  Current Outpatient Medications  Medication Sig Dispense Refill  . Coenzyme Q10 100 MG capsule Take 100 mg by mouth daily.     [name] lidocaine-prilocaine (EMLA) cream Apply to affected area once 30 g 3  . ondansetron (ZOFRAN) 8 MG tablet Take 1 tablet (8 mg total) by mouth 2 (two) times daily as needed. Start on the third day after chemotherapy. (Patient not taking: Reported on 01/14/2018) 30 tablet 1  . prochlorperazine (COMPAZINE) 10 MG tablet Take 1 tablet (10 mg total) by mouth every 6 (six) hours as needed (Nausea or vomiting). (Patient not taking: Reported on 01/14/2018) 60 tablet 1   No current facility-administered medications for this visit.    Facility-Administered Medications Ordered in Other Visits  Medication Dose Route Frequency Provider Last Rate Last Dose  . sodium chloride flush (NS) 0.9 % injection 10 mL  10 mL Intracatheter PRN [name], MD   10 mL at 01/14/18 1722    REVIEW OF SYSTEMS:   Constitutional: Denies fevers, chills or abnormal night sweats   Eyes: Denies blurriness of vision, double vision or watery eyes Ears, nose, mouth, throat, and face: Denies mucositis or sore throat Respiratory: Denies cough, dyspnea or wheezes Cardiovascular: Denies palpitation, chest discomfort or lower extremity swelling Gastrointestinal:  Denies nausea, heartburn   Skin: Denies abnormal skin rashes   Breast: (+) Reduced right nipple inversion and softer mass Lymphatics: Denies new lymphadenopathy or easy bruising Neurological:Denies numbness, tingling or new weaknesses Behavioral/Psych: Mood is stable, no new changes  All other systems were reviewed with the patient and are negative.  PHYSICAL EXAMINATION:  ECOG PERFORMANCE STATUS: 0 - Asymptomatic  Vitals:   01/14/18 1321  BP: (!) 165/92  Pulse: 74  Resp: 17  Temp: 98.1 F (36.7 C)  SpO2: 100%   Filed Weights   01/14/18 1321  Weight: 169 lb 3.2 oz (76.7 kg)    GENERAL:alert, no  distress and comfortable SKIN: skin color, texture, turgor are normal, no rashes or significant lesions EYES: normal, conjunctiva are pink and non-injected, sclera clear  OROPHARYNX:no exudate, no erythema and lips, buccal mucosa, and tongue normal  NECK: supple, thyroid normal size, non-tender, without nodularity LYMPH:  no palpable lymphadenopathy in the cervical, axillary or inguinal LUNGS: clear to auscultation and percussion with normal breathing effort HEART: regular rate & rhythm and no murmurs and no lower extremity edema ABDOMEN:abdomen soft, non-tender and normal bowel sounds Musculoskeletal:no cyanosis of digits and no clubbing  PSYCH: alert & oriented x 3 with fluent speech NEURO: no focal motor/sensory deficits BREAST: Skin hyperpigmentation of right nipple, 5.5x6 cm mass (previously 6x7cm) in center of right breast, now softer. She previously had a 1 x 1.5 cm mobile nodule to her right axilla, not palpable anymore. No palpable mass in left breast. Mild skin edema. No ulcers, bleeding, or discharge.    LABORATORY DATA:  I have reviewed the data as listed CBC Latest Ref Rng & Units 01/14/2018 01/07/2018 12/31/2017  WBC 3.9 - 10.3 K/uL 5.2 4.8 6.2  Hemoglobin 11.6 - 15.9 g/dL 11.1(L) 10.8(L) 10.8(L)  Hematocrit 34.8 - 46.6 % 33.4(L) 32.2(L) 32.1(L)  Platelets 145 - 400 K/uL 163 212 221    CMP Latest Ref Rng & Units 01/14/2018 01/07/2018 12/31/2017  Glucose 70 - 99 mg/dL 99 95 93  BUN 8 - 23 mg/dL '22 18 23  '$ Creatinine 0.44 - 1.00 mg/dL 0.73 0.70 0.70  Sodium 135 - 145 mmol/L 141 142 140  Potassium 3.5 - 5.1 mmol/L 3.9 3.8 3.9  Chloride 98 - 111 mmol/L 108 107 108  CO2 22 - 32 mmol/L '25 29 25  '$ Calcium 8.9 - 10.3 mg/dL 8.7(L) 9.2 9.0  Total Protein 6.5 - 8.1 g/dL 6.1(L) 6.1(L) 6.3(L)  Total Bilirubin 0.3 - 1.2 mg/dL 0.4 0.3 0.3  Alkaline Phos 38 - 126 U/L 56 61 72  AST 15 - 41 U/L 15 14(L) 14(L)  ALT 0 - 44 U/L '16 15 13     '$ PATHOLOGY   Diagnosis 10/13/17 1. Breast, right,  needle core biopsy, 1 o'clock - INVASIVE DUCTAL CARCINOMA. - SEE COMMENT. 2. Breast, right, needle core biopsy, 7 o'clock - INVASIVE DUCTAL CARCINOMA. - SEE COMMENT. Microscopic Comment 1. and 2. The carcinoma in parts 1 and 2 is morphologically similar and appears grade II. Because breast prognostic profiles were performed on the patients previous case, [id number], it will not be repeated on the current case unless requested.     Diagnosis 09/23/17 1. Breast, left, needle core biopsy, 2 o'clock - INVASIVE DUCTAL CARCINOMA. - SEE COMMENT. 2. Breast, right, needle core biopsy, 11:30 o'clock - INVASIVE MAMMARY CARCINOMA. - SEE COMMENT. 3. Lymph node, needle/core biopsy, right axilla - METASTATIC MAMMARY CARCINOMA IN 1 OF 1 LYMPH NODE (1/1). Microscopic Comment 1. There is a small focus of grade I invasive ductal carcinoma. A complete breast prognostic profile will be attempted and the results reported separately. The results were called to the [facility] on 09/24/2017. 2. The carcinoma appears grade II. An E-Cadherin stain and a breast prognostic profile will be performed on part 2 and the results reported separately. (JBK:kh 09-24-17) [name] 1. HER2 Negative by FISH Estrogen receptor: 100% positive, strong staining Progesterone receptor: 80% positive, strong staining Ki67: 5% 2. HER2 Negative by FISH Estrogen receptor: 100% positive, strong staining Progesterone receptor: 0%, negative  Ki67: 30% 2. The tumor cells are strongly positive for E-cadherin, supporting a ductal phenotype     RADIOGRAPHIC STUDIES: I have personally reviewed the radiological images as listed and agreed with the findings in the report. US  Breast Ltd Uni Left Inc Axilla  Result Date: 12/16/2017 CLINICAL DATA:  62 year old female with multicentric invasive right breast cancer metastatic to the right axillary lymph nodes, as well as a single focus of invasive breast cancer on the  left. The patient is currently undergoing chemotherapy and presents for evaluation for treatment response. EXAM: DIGITAL DIAGNOSTIC BILATERAL MAMMOGRAM WITH CAD AND TOMO ULTRASOUND BILATERAL BREAST COMPARISON:  Previous exam(s). ACR Breast Density Category c: The breast tissue is heterogeneously dense, which may obscure small masses. FINDINGS: Targeted ultrasound is performed, showing numerous irregular, hypoechoic masses scattered throughout the right breast. These measure stable to minimally decreased in size when compared to prior mammograms. A mass at the 1 o'clock position 2 cm from the nipple measures 1.7 x 0.9 x 0.9 cm (previously 1.7 x 1.6 x 0.5 cm). A mass at the 7 o'clock position 4 cm from the nipple measures 1.6 x 1.6 x 1.4 cm (previously 2.7 x 1.7 x 1.4 cm). A large mass involving the upper outer quadrant measures 7.6 x 2.1 cm (previously 7.4 x 2.8 cm). A large right axillary lymph node measures 2.9 x 2.3 x 2.1 cm (previously 2.8 x 2.5 x 2.5 cm). Evaluation of the left breast demonstrates grossly unchanged appearance of a circumscribed mass at the 2 o'clock position 6 cm from the nipple. It measures 0.9 x 0.8 x 0.6 cm (previously 0.9 x 0.9 x 0.5 cm). The patient was then brought to the mammographic suite for additional evaluation of the bilateral breast given the diffuse disease identified sonographically. A global asymmetry with associated distortion and post biopsy clips in the upper outer quadrant of the right breast appears slightly less dense on today's mammographic views. There is increased skin and trabecular thickening involving the entire right breast. Mammographic images were processed with CAD. IMPRESSION: 1. Stable to minimal decrease in size of multiple irregular masses throughout the right breast consistent with the patient's sites of biopsy proven malignancy. While the mammographic appearance is slightly less dense in this region, there is increased skin and trabecular thickening  involving the entire right breast. 2. Stable appearance of morphologically abnormal right axillary lymph nodes, consistent with biopsy proven metastatic disease. 3. Stable appearance of a left breast mass, consistent with biopsy proven malignancy. RECOMMENDATION: Per treatment plan. I have discussed the findings and recommendations with the patient. Results were also provided in writing at the conclusion of the visit. If applicable, a reminder letter will be sent to the patient regarding the next appointment. BI-RADS CATEGORY  6: Known biopsy-proven malignancy. Electronically Signed   By: Kristopher Oppenheim M.D.   On: 12/16/2017 16:27   US Breast Ltd Uni Right Inc Axilla  Result Date: 12/16/2017 CLINICAL DATA:  62 year old female with multicentric invasive right breast cancer metastatic to the right axillary lymph nodes, as well as a single focus of invasive breast cancer on the left. The patient is currently undergoing chemotherapy and presents for evaluation for treatment response. EXAM: DIGITAL DIAGNOSTIC BILATERAL MAMMOGRAM WITH CAD AND TOMO ULTRASOUND BILATERAL BREAST COMPARISON:  Previous exam(s). ACR Breast Density Category c: The breast tissue is heterogeneously dense, which may obscure small masses. FINDINGS: Targeted ultrasound is performed, showing numerous irregular, hypoechoic masses scattered throughout the right breast. These measure stable to minimally decreased in size when compared to prior mammograms. A mass at the 1 o'clock position 2 cm from the nipple measures 1.7 x 0.9 x 0.9 cm (previously 1.7 x 1.6 x 0.5 cm). A mass at the 7 o'clock position  4 cm from the nipple measures 1.6 x 1.6 x 1.4 cm (previously 2.7 x 1.7 x 1.4 cm). A large mass involving the upper outer quadrant measures 7.6 x 2.1 cm (previously 7.4 x 2.8 cm). A large right axillary lymph node measures 2.9 x 2.3 x 2.1 cm (previously 2.8 x 2.5 x 2.5 cm). Evaluation of the left breast demonstrates grossly unchanged appearance of a  circumscribed mass at the 2 o'clock position 6 cm from the nipple. It measures 0.9 x 0.8 x 0.6 cm (previously 0.9 x 0.9 x 0.5 cm). The patient was then brought to the mammographic suite for additional evaluation of the bilateral breast given the diffuse disease identified sonographically. A global asymmetry with associated distortion and post biopsy clips in the upper outer quadrant of the right breast appears slightly less dense on today's mammographic views. There is increased skin and trabecular thickening involving the entire right breast. Mammographic images were processed with CAD. IMPRESSION: 1. Stable to minimal decrease in size of multiple irregular masses throughout the right breast consistent with the patient's sites of biopsy proven malignancy. While the mammographic appearance is slightly less dense in this region, there is increased skin and trabecular thickening involving the entire right breast. 2. Stable appearance of morphologically abnormal right axillary lymph nodes, consistent with biopsy proven metastatic disease. 3. Stable appearance of a left breast mass, consistent with biopsy proven malignancy. RECOMMENDATION: Per treatment plan. I have discussed the findings and recommendations with the patient. Results were also provided in writing at the conclusion of the visit. If applicable, a reminder letter will be sent to the patient regarding the next appointment. BI-RADS CATEGORY  6: Known biopsy-proven malignancy. Electronically Signed   By: Kristopher Oppenheim M.D.   On: 12/16/2017 16:27   Mm Diag Breast Tomo Bilateral  Result Date: 12/16/2017 CLINICAL DATA:  62 year old female with multicentric invasive right breast cancer metastatic to the right axillary lymph nodes, as well as a single focus of invasive breast cancer on the left. The patient is currently undergoing chemotherapy and presents for evaluation for treatment response. EXAM: DIGITAL DIAGNOSTIC BILATERAL MAMMOGRAM WITH CAD AND TOMO  ULTRASOUND BILATERAL BREAST COMPARISON:  Previous exam(s). ACR Breast Density Category c: The breast tissue is heterogeneously dense, which may obscure small masses. FINDINGS: Targeted ultrasound is performed, showing numerous irregular, hypoechoic masses scattered throughout the right breast. These measure stable to minimally decreased in size when compared to prior mammograms. A mass at the 1 o'clock position 2 cm from the nipple measures 1.7 x 0.9 x 0.9 cm (previously 1.7 x 1.6 x 0.5 cm). A mass at the 7 o'clock position 4 cm from the nipple measures 1.6 x 1.6 x 1.4 cm (previously 2.7 x 1.7 x 1.4 cm). A large mass involving the upper outer quadrant measures 7.6 x 2.1 cm (previously 7.4 x 2.8 cm). A large right axillary lymph node measures 2.9 x 2.3 x 2.1 cm (previously 2.8 x 2.5 x 2.5 cm). Evaluation of the left breast demonstrates grossly unchanged appearance of a circumscribed mass at the 2 o'clock position 6 cm from the nipple. It measures 0.9 x 0.8 x 0.6 cm (previously 0.9 x 0.9 x 0.5 cm). The patient was then brought to the mammographic suite for additional evaluation of the bilateral breast given the diffuse disease identified sonographically. A global asymmetry with associated distortion and post biopsy clips in the upper outer quadrant of the right breast appears slightly less dense on today's mammographic views. There is increased skin and  trabecular thickening involving the entire right breast. Mammographic images were processed with CAD. IMPRESSION: 1. Stable to minimal decrease in size of multiple irregular masses throughout the right breast consistent with the patient's sites of biopsy proven malignancy. While the mammographic appearance is slightly less dense in this region, there is increased skin and trabecular thickening involving the entire right breast. 2. Stable appearance of morphologically abnormal right axillary lymph nodes, consistent with biopsy proven metastatic disease. 3. Stable  appearance of a left breast mass, consistent with biopsy proven malignancy. RECOMMENDATION: Per treatment plan. I have discussed the findings and recommendations with the patient. Results were also provided in writing at the conclusion of the visit. If applicable, a reminder letter will be sent to the patient regarding the next appointment. BI-RADS CATEGORY  6: Known biopsy-proven malignancy. Electronically Signed   By: Kristopher Oppenheim M.D.   On: 12/16/2017 16:27    ASSESSMENT & PLAN:  Darlene Beasley is a 62 y.o. Caucasian female with history of substance use presented with right nipple inversion for 3 months.    1. 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 She has locally advanced right breast cancer with a dominant 9.6 cm mass involving all 4 quadrants of the breast involving 5 pathologic axillary lymph nodes. She has a small left side breast cancer as well. She saw Dr. Dalbert Batman who discussed surgical options, including right mastectomy and possible left breast conserving surgery with subsequent radiation. She also met with Dr. Lisbeth Renshaw. I previously discussed the advantages of neoadjuvant chemotherapy and recommends AC x4 followed by weekly taxol x12 cycles. She is active and relatively healthy, she appears to be a good candidate for chemotherapy  -Her baseline echo from 10/15/17 shows grade 1 diastolic dysfunction, EF 83-41%, GLS -21% -She completed AC every 2 weeks 10/22/17-12/03/17. She tolerated well with mild bone pain from neulasta, mucositosis and intermittent fatigue.  -Her clinical breast exam and 12/16/17  interim mammo and US reveal stable to minimal decrease in size of multiple irregular masses in the right breast, stable right axillary lymph nodes, and stable left breast mass. Her disease does not appear to be very sensitive to chemotherapy. -Will repeat breast MRI after 4 weeks of Taxol, scheduled  -She started weekly taxol on 12/17/17 and is tolerating very well with no significant side effects. She experiences sweating during infusion. Will monitor.  -Labs reviewed and adequate to proceed with Taxol today.  -F/u in 2 weeks    2. Social support -She works 60 hours per week at her physically demanding job. I informed her she may need to take time off during her intensive chemotherapy schedule. She plans to apply for medicaid. I previously referred her to social work for ongoing f/u.  -She has met with our financial office, she is working on Engineer, mining.   3. Substance use -She has been smoking cigarettes and marijuana for decades. I encouraged her to quit completely.  -She mentioned interest in herbal and holistic diet, I previously recommended that she avoid herbal supplements while on chemotherapy and endocrine therapy due to potential drug interactions, she understands.  -She now smokes 3 cigarettes  a day. I previously offered her the nicotine patch and chantix to which the patient declines this at this time.  -I again encouraged her to quit completely   4. Insomnia -She works long hours and has interrupted sleep.  -I previously recommended benadryl and or melatonin OTC first. She agreed.   5. Elevated BP  -Her BP has been elevated during her visits. She has not checked at home. I advise she reduce salt intake, increase water intake and if BP high in future I will start her on hypertension medication.  -I previously discussed with the patient to continue with decreasing sodium intake as well as continue with exercise to aid with elevated blood pressure readings.  -She will continue  to monitor her sodium intake to aid with her elevated blood pressure readings.  -BP at 165/92 today (01/14/18)  6. 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  7. 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  -I encouraged her to avoid too much direct sunlight and use stronger SPF Suncreen given her MITF gene mutation.    PLAN:  -Lab reviewed, adequate for treatment, will proceed to cycle 4 taxol today and continue weekly  -f/u in 2 weeks  -will repeat breast MRI next week  All questions were answered. The patient knows to call the clinic with any problems, questions or concerns.  I spent 20 minutes counseling the patient face to face. The total time spent in the appointment was 25 minutes and more than 50% was on counseling.     [name], MD 01/14/2018   I, Joslyn Devon, am acting as scribe for [name], MD.   I have reviewed the above documentation for accuracy and completeness, and I agree with the above.

## 2018-01-14 NOTE — Telephone Encounter (Signed)
2nd attempt to reach pt regarding MRI date and time. Will follow up

## 2018-01-15 ENCOUNTER — Telehealth: Payer: Self-pay | Admitting: Hematology

## 2018-01-15 NOTE — Telephone Encounter (Signed)
Appointments moved as requested by Dr Burr Medico.  I tried to reach patient and was unable to leave VM due to not VM being set up.  Letter/ Calendar was mailed per 8/7 los

## 2018-01-19 ENCOUNTER — Telehealth: Payer: Self-pay | Admitting: Emergency Medicine

## 2018-01-19 NOTE — Telephone Encounter (Addendum)
2 attempts made today to reach patient regarding this note. I will keep current MRI appt since we are unable to reach her. No VM set up on phone to leave a message.   ----- Message from Alla Feeling, NP sent at 01/19/2018  3:36 PM EDT ----- My nurse,  Please see if her breast MRI can be moved to 8/13 or earlier in the morning on 8/14 before chemo. It is currently scheduled at 7 pm on 8/14. This study is needed to evaluate if her chemo is working. Please check if [name] can see her before treatment on 8/14.   Thanks,  Regan Rakers

## 2018-01-20 ENCOUNTER — Ambulatory Visit (HOSPITAL_COMMUNITY): Payer: Medicaid Other

## 2018-01-21 ENCOUNTER — Inpatient Hospital Stay: Payer: Medicaid Other

## 2018-01-21 ENCOUNTER — Other Ambulatory Visit: Payer: Medicaid Other

## 2018-01-21 ENCOUNTER — Ambulatory Visit (HOSPITAL_COMMUNITY)
Admission: RE | Admit: 2018-01-21 | Discharge: 2018-01-21 | Disposition: A | Discharge status: Home / Self Care | Payer: Medicaid Other | Source: Ambulatory Visit | Attending: Nurse Practitioner | Admitting: Nurse Practitioner

## 2018-01-21 ENCOUNTER — Ambulatory Visit: Payer: Medicaid Other | Admitting: Hematology

## 2018-01-21 VITALS — BP 163/96 | HR 77 | Temp 98.0°F | Resp 17

## 2018-01-21 DIAGNOSIS — Z5111 Encounter for antineoplastic chemotherapy: Secondary | ICD-10-CM | POA: Diagnosis not present

## 2018-01-21 DIAGNOSIS — R590 Localized enlarged lymph nodes: Secondary | ICD-10-CM | POA: Insufficient documentation

## 2018-01-21 DIAGNOSIS — Z170 Estrogen receptor positive status [ER+]: Principal | ICD-10-CM

## 2018-01-21 DIAGNOSIS — C773 Secondary and unspecified malignant neoplasm of axilla and upper limb lymph nodes: Secondary | ICD-10-CM

## 2018-01-21 DIAGNOSIS — N6321 Unspecified lump in the left breast, upper outer quadrant: Secondary | ICD-10-CM | POA: Insufficient documentation

## 2018-01-21 DIAGNOSIS — C50811 Malignant neoplasm of overlapping sites of right female breast: Secondary | ICD-10-CM

## 2018-01-21 DIAGNOSIS — Z95828 Presence of other vascular implants and grafts: Secondary | ICD-10-CM

## 2018-01-21 DIAGNOSIS — C50919 Malignant neoplasm of unspecified site of unspecified female breast: Secondary | ICD-10-CM

## 2018-01-21 LAB — CMP (CANCER CENTER ONLY)
ALBUMIN: 3.6 g/dL (ref 3.5–5.0)
ALK PHOS: 55 U/L (ref 38–126)
ALT: 18 U/L (ref 0–44)
ANION GAP: 10 (ref 5–15)
AST: 14 U/L — ABNORMAL LOW (ref 15–41)
BILIRUBIN TOTAL: 0.3 mg/dL (ref 0.3–1.2)
BUN: 22 mg/dL (ref 8–23)
CALCIUM: 8.8 mg/dL — AB (ref 8.9–10.3)
CO2: 24 mmol/L (ref 22–32)
Chloride: 108 mmol/L (ref 98–111)
Creatinine: 0.7 mg/dL (ref 0.44–1.00)
GLUCOSE: 91 mg/dL (ref 70–99)
Potassium: 3.9 mmol/L (ref 3.5–5.1)
Sodium: 142 mmol/L (ref 135–145)
TOTAL PROTEIN: 6 g/dL — AB (ref 6.5–8.1)

## 2018-01-21 LAB — CBC WITH DIFFERENTIAL (CANCER CENTER ONLY)
Basophils Absolute: 0 10*3/uL (ref 0.0–0.1)
Basophils Relative: 1 %
Eosinophils Absolute: 0.2 10*3/uL (ref 0.0–0.5)
Eosinophils Relative: 5 %
HEMATOCRIT: 32.5 % — AB (ref 34.8–46.6)
HEMOGLOBIN: 10.9 g/dL — AB (ref 11.6–15.9)
LYMPHS ABS: 1.2 10*3/uL (ref 0.9–3.3)
LYMPHS PCT: 26 %
MCH: 33.1 pg (ref 25.1–34.0)
MCHC: 33.4 g/dL (ref 31.5–36.0)
MCV: 98.9 fL (ref 79.5–101.0)
MONOS PCT: 5 %
Monocytes Absolute: 0.2 10*3/uL (ref 0.1–0.9)
NEUTROS ABS: 3 10*3/uL (ref 1.5–6.5)
Neutrophils Relative %: 63 %
Platelet Count: 179 10*3/uL (ref 145–400)
RBC: 3.28 MIL/uL — ABNORMAL LOW (ref 3.70–5.45)
RDW: 17.5 % — ABNORMAL HIGH (ref 11.2–14.5)
WBC Count: 4.7 10*3/uL (ref 3.9–10.3)

## 2018-01-21 MED ORDER — FAMOTIDINE IN NACL 20-0.9 MG/50ML-% IV SOLN
INTRAVENOUS | Status: AC
  Filled 2018-01-21: qty 50

## 2018-01-21 MED ORDER — SODIUM CHLORIDE 0.9 % IV SOLN
80.0000 mg/m2 | Freq: Once | INTRAVENOUS | Status: AC
  Administered 2018-01-21: 150 mg via INTRAVENOUS
  Filled 2018-01-21: qty 25

## 2018-01-21 MED ORDER — DIPHENHYDRAMINE HCL 50 MG/ML IJ SOLN
INTRAMUSCULAR | Status: AC
  Filled 2018-01-21: qty 1

## 2018-01-21 MED ORDER — GADOBENATE DIMEGLUMINE 529 MG/ML IV SOLN
15.0000 mL | Freq: Once | INTRAVENOUS | Status: AC | PRN
  Administered 2018-01-21: 15 mL via INTRAVENOUS

## 2018-01-21 MED ORDER — FAMOTIDINE IN NACL 20-0.9 MG/50ML-% IV SOLN
20.0000 mg | Freq: Once | INTRAVENOUS | Status: AC
  Administered 2018-01-21: 20 mg via INTRAVENOUS

## 2018-01-21 MED ORDER — DIPHENHYDRAMINE HCL 50 MG/ML IJ SOLN
50.0000 mg | Freq: Once | INTRAMUSCULAR | Status: AC
  Administered 2018-01-21: 50 mg via INTRAVENOUS

## 2018-01-21 MED ORDER — SODIUM CHLORIDE 0.9% FLUSH
10.0000 mL | INTRAVENOUS | Status: DC | PRN
  Administered 2018-01-21: 10 mL
  Filled 2018-01-21: qty 10

## 2018-01-21 MED ORDER — SODIUM CHLORIDE 0.9 % IV SOLN
20.0000 mg | Freq: Once | INTRAVENOUS | Status: AC
  Administered 2018-01-21: 20 mg via INTRAVENOUS
  Filled 2018-01-21: qty 2

## 2018-01-21 MED ORDER — SODIUM CHLORIDE 0.9% FLUSH
10.0000 mL | Freq: Once | INTRAVENOUS | Status: AC
  Administered 2018-01-21: 10 mL
  Filled 2018-01-21: qty 10

## 2018-01-21 MED ORDER — SODIUM CHLORIDE 0.9 % IV SOLN
Freq: Once | INTRAVENOUS | Status: AC
  Administered 2018-01-21: 13:00:00 via INTRAVENOUS
  Filled 2018-01-21: qty 250

## 2018-01-21 MED ORDER — HEPARIN SOD (PORK) LOCK FLUSH 100 UNIT/ML IV SOLN
500.0000 [IU] | Freq: Once | INTRAVENOUS | Status: AC | PRN
  Administered 2018-01-21: 500 [IU]
  Filled 2018-01-21: qty 5

## 2018-01-21 NOTE — Patient Instructions (Signed)
[facility] Discharge Instructions for Patients Receiving Chemotherapy  Today you received the following chemotherapy agents taxol  To help prevent nausea and vomiting after your treatment, we encourage you to take your nausea medication as directed   If you develop nausea and vomiting that is not controlled by your nausea medication, call the clinic.   BELOW ARE SYMPTOMS THAT SHOULD BE REPORTED IMMEDIATELY:  *FEVER GREATER THAN 100.5 F  *CHILLS WITH OR WITHOUT FEVER  NAUSEA AND VOMITING THAT IS NOT CONTROLLED WITH YOUR NAUSEA MEDICATION  *UNUSUAL SHORTNESS OF BREATH  *UNUSUAL BRUISING OR BLEEDING  TENDERNESS IN MOUTH AND THROAT WITH OR WITHOUT PRESENCE OF ULCERS  *URINARY PROBLEMS  *BOWEL PROBLEMS  UNUSUAL RASH Items with * indicate a potential emergency and should be followed up as soon as possible.  Feel free to call the clinic should you have any questions or concerns. The clinic phone number is [phone].  Please show the CHEMO ALERT CARD at check-in to the Emergency Department and triage nurse.   

## 2018-01-22 LAB — CANCER ANTIGEN 27.29: CA 27.29: 23.1 U/mL (ref 0.0–38.6)

## 2018-01-24 NOTE — Progress Notes (Signed)
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 Highly suspicious large right breast mass involving the outer and upper breast extending from approximately 9 o'clock through 12-1 o'clock, maximum measurement approximating 7.5 cm. The mass involves the right nipple, accounting for nipple retraction. Architectural distortion, microcalcifications and diffuse trabecular thickening and skin thickening are associated with the large mass, raising the possibility of this representing an inflammatory cancer. 2. Satellite masses separate from the dominant mass involving the lower outer quadrant and the upper inner quadrant of the right breast, measured above. 3. Two adjacent pathologic right axillary lymph nodes. 4. Indeterminate 0.9 cm solid mass involving the upper outer quadrant of the left breast which accounts for a mammographic finding. 5. No pathologic left axillary lymphadenopathy.    09/09/2017 Breast US    Targeted right breast ultrasound is performed, showing a very large hypoechoic mass with irregular margins extending from the approximate 9 o'clock position through the 12 to 1 o'clock position. On the CC gait image, the mass measures maximally approximately 7.5 cm. The mass has  irregular margins, demonstrates acoustic shadowing, and demonstrates internal power Doppler flow. The mass extends into the retroareolar region and involves the nipple, accounting for the nipple retraction.  Separate from the dominant mass at the 7 o'clock position approximately 4 cm from nipple is a hypoechoic antiparallel mass with irregular margins measuring approximately 1.4 x 2.7 x 1.7 cm.  Separate from the dominant mass at the 1 o'clock position approximately 2 cm from nipple is a hypoechoic mass with irregular margins measuring approximately 1.7 x 0.5 x 1.6 cm. Both of these masses also demonstrate internal power Doppler flow.  Sonographic evaluation of the right axilla demonstrates 2 adjacent pathologic lymph nodes, the larger measuring approximately 2.5 x 2.5 x 2.8 cm, the smaller measuring approximately 2.0 x 0.8 x 2.7 cm.    09/23/2017 Initial Biopsy    Diagnosis 1. Breast, left, needle core biopsy, 2 o'clock - INVASIVE DUCTAL CARCINOMA. - SEE COMMENT. 2. Breast, right, needle core biopsy, 11:30 o'clock - INVASIVE MAMMARY CARCINOMA. - SEE COMMENT. 3. Lymph node, needle/core biopsy, right axilla - METASTATIC MAMMARY CARCINOMA IN 1 OF 1 LYMPH NODE (1/1). Microscopic Comment 1. There is a small focus of grade I invasive ductal carcinoma. A complete breast prognostic profile will be attempted and the results reported separately. The results were called to the [facility] on 09/24/2017. 2. The carcinoma appears grade II. An E-Cadherin stain and a breast prognostic profile will be performed on part 2 and the results reported separately. (JBK:kh 09-24-17) [name]  1. HER2 Negative by FISH Estrogen receptor: 100% positive, strong staining Progesterone receptor: 80% positive, strong staining Ki67: 5%  2. HER2 Negative by FISH Estrogen receptor: 100% positive, strong staining Progesterone   receptor: 0%, negative  Ki67: 30% 2. The tumor cells are strongly positive  for E-cadherin, supporting a ductal phenotype    09/23/2017 Cancer Staging    Staging form: Breast, AJCC 8th Edition - Clinical stage from 09/23/2017: Stage IIIA (cT3, cN1, cM0, G2, ER+, PR-, HER2-) - Signed by Feng, Yan, MD on 10/09/2017    10/07/2017 Breast MRI    IMPRESSION: 1. Biopsy proven malignancy involving all 4 quadrants of the right breast, although predominantly located in the anterior third of the central upper right breast measures 6.8 x 9.6 x 7.2 cm.  2. Five pathologic right axillary lymph nodes, compatible with biopsy proven axillary metastases.  3. Small mass with associated biopsy related changes in the upper-outer left breast at site of known malignancy measures 0.9 x 0.8 x 0.7 cm. No abnormal lymph nodes seen in the left axilla.  RECOMMENDATION: Treatment plan for known bilateral breast malignancy.  BI-RADS CATEGORY  6: Known biopsy-proven malignancy.    10/08/2017 Initial Diagnosis    Cancer of overlapping sites of right female breast (HCC)    10/13/2017 Pathology Results    Diagnosis 1. Breast, right, needle core biopsy, 1 o'clock - INVASIVE DUCTAL CARCINOMA. - SEE COMMENT. 2. Breast, right, needle core biopsy, 7 o'clock - INVASIVE DUCTAL CARCINOMA. - SEE COMMENT. Microscopic Comment 1. and 2. The carcinoma in parts 1 and 2 is morphologically similar and appears grade II. Because breast prognostic profiles were performed on the patients previous case, [id number], it will not be repeated on the current case unless requested.     10/17/2017 Imaging    CT CAP IMPRESSION: 1. Infiltrative right breast mass with overlying skin thickening is identified compatible with known breast cancer. 2. Enlarged right axillary lymph nodes compatible with metastatic adenopathy. 3. No additional sites of disease identified within the chest and no evidence for metastasis to the abdomen or pelvis. 4. Aortic atherosclerosis and 3 vessel coronary artery atherosclerotic  calcifications. Aortic Atherosclerosis (ICD10-I70.0).    10/17/2017 Imaging    Bone Scan FINDINGS: There are no foci of increased or decreased radiotracer uptake to suggest osseous metastatic disease. 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 There is degenerative type uptake within both shoulders and the left ankle.  Normal physiologic activity is identified within the kidneys and urinary bladder.  IMPRESSION: No evidence of osseous metastatic disease.    10/22/2017 -  Chemotherapy    AC q2 weeks x4 cycles starting 10/22/17-12/03/17, followed by weekly taxol x12 starting 12/17/17    11/14/2017 Genetic Testing    The genes that were analyzed were the 83 genes on [facility]'s Multi-Cancer panel (ALK, APC, ATM, AXIN2, BAP1, BARD1, BLM, BMPR1A, BRCA1, BRCA2, BRIP1, CASR, CDC73, CDH1, CDK4, CDKN1B, CDKN1C, CDKN2A, CEBPA, CHEK2, CTNNA1, DICER1, DIS3L2, EGFR, EPCAM, FH, FLCN, GATA2, GPC3, GREM1, HOXB13, HRAS, KIT, MAX, MEN1, MET, MITF, MLH1, MSH2, MSH3, MSH6, MUTYH, NBN, NF1, NF2, NTHL1, PALB2, PDGFRA, PHOX2B, PMS2, POLD1, POLE, POT1, PRKAR1A, PTCH1, PTEN, RAD50, RAD51C, RAD51D, RB1, RECQL4, RET, RUNX1, SDHA, SDHAF2, SDHB, SDHC, SDHD, SMAD4, SMARCA4, SMARCB1, SMARCE1, STK11, SUFU, TERC, TERT, TMEM127, TP53, TSC1, TSC2, VHL, WRN, WT1).  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    12/16/2017 Breast US    IMPRESSION: 1. Stable to minimal decrease in size of multiple irregular masses throughout the right breast consistent with the patient's sites of   biopsy proven malignancy. While the mammographic appearance is slightly less dense in this region, there is increased skin and trabecular thickening involving the entire right breast. 2. Stable appearance of morphologically abnormal  right axillary lymph nodes, consistent with biopsy proven metastatic disease. 3. Stable appearance of a left breast mass, consistent with biopsy proven malignancy.      Malignant neoplasm of left breast in female, estrogen receptor positive (HCC)   09/09/2017 Breast US    Targeted left breast ultrasound is performed, showing an oval parallel hypoechoic mass containing microcalcifications at the 2 o'clock position approximately 6 cm from nipple measuring approximately 0.9 x 0.5 x 0.9 cm, corresponding to the mammographic finding.  Sonographic evaluation of the left axilla demonstrates no pathologic lymphadenopathy.    09/23/2017 Initial Biopsy    Diagnosis 1. Breast, left, needle core biopsy, 2 o'clock - INVASIVE DUCTAL CARCINOMA. - SEE COMMENT. 2. Breast, right, needle core biopsy, 11:30 o'clock - INVASIVE MAMMARY CARCINOMA. - SEE COMMENT. 3. Lymph node, needle/core biopsy, right axilla - METASTATIC MAMMARY CARCINOMA IN 1 OF 1 LYMPH NODE (1/1). Microscopic Comment 1. There is a small focus of grade I invasive ductal carcinoma. A complete breast prognostic profile will be attempted and the results reported separately. The results were called to the [facility] on 09/24/2017. 2. The carcinoma appears grade II. An E-Cadherin stain and a breast prognostic profile will be performed on part 2 and the results reported separately. (JBK:kh 09-24-17) [name]  1. HER2 Negative by FISH Estrogen receptor: 100% positive, strong staining Progesterone receptor: 80% positive, strong staining Ki67: 5%  2. HER2 Negative by FISH Estrogen receptor: 100% positive, strong staining Progesterone receptor: 0%, negative  Ki67: 30%    10/08/2017 Initial Diagnosis    Malignant neoplasm of left breast in female, estrogen receptor positive (HCC)    12/16/2017 Breast US    IMPRESSION: 3. Stable appearance of a left breast mass, consistent with biopsy proven malignancy.     01/22/2018 Imaging     01/22/2018 MRI Bilateral Breast IMPRESSION: 1. Dominant enhancing mass with surrounding nodularity involving the central anterior third of the right breast now measures up to 6.4 x 6.7 x 7.6 cm (previously measured 6.8 x 9.6 x 7.2 cm). Persistent bulky right axillary lymphadenopathy, slightly decreased in size since prior MRI.  2. Biopsy proven left breast malignancy now measures 0.5 cm (previously measured 0.9 x 0.8 x 0.7 cm).      HISTORY OF PRESENTING ILLNESS:   Darlene Beasley 61 y.o. female is here because of newly diagnosed bilateral breast cancer. In 03/2017 she "pinched" herself at work and developed a right breast bruise and soreness which finally resolved in 04/2017. In January 2019 she noticed right nipple inversion. She researched online and made plans to have a mammogram, last done 30 years ago. She had a palpable breast 'lump" which she felt after her son was born 40 years ago that was never biopsied and eventually resolved. She reports intentional weight loss.   Imaging revealed a large right breast mass involving the upper outer quadrant extending from 9 o'clock to 12-1 o'clock, measuring approximately 7.5 cm, involving the right nipple. There is a satellite lesion at the 7 o'clock position approximately 4 cm from the nipple with irregular margins measuring 1.4 x 2.7, x 1.7 cm. Additional satellite lesion at 1 o'clock position approximately 2 cm from the nipple measures 1.7 x 0.5 x 1.6 cm. US of right axilla reveals 2 adjacent pathologic lymph nodes, measuring   2.5 x 2.5 x 2.8 cm and 2.0 x 0.8 x 2.7 cm. Biopsy of the right breast revealed invasive mammary carcinoma, tumor cells are strongly positive for E-cadherin, supporting a ductal phenotype; grade II, HER2 negative, ER positive, PR negative, metastatic to right axilla. Targeted left breast US reveals hypoechoic mass at the 2 o'clock position approximately 6 cm from the nipple measuring 0.9 x 0.5 x 0.9 fm. No suspicious  adenopathy in the left axilla. Biopsy of the left breast revealed invasive ductal carcinoma, grade I, HER2 negative, ER/PR positive. Breast MRI revealed biopsy-proven malignancy involving all 4 quadrants of the right breast, predominantly in the anterior third of the central upper right breast measuring 6.8 x 9.6 x 7.2 cm with 5 pathologic right axillary lymph nodes, compatible with biopsy-proven axillary metastases. In the left breast there is a small mass with associated biopsy related changes in the upper outer left breast at site of known malignancy, no abnormal axillary lymph nodes.   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 Had GERD in the past. She has smoked 1-2 PPD cigarettes x51 years, currently weaning. She quit drinking alcohol when she changed her diet and began losing weight in 08/2017. She has a significant other with whom she resides. She is independent of all ADLs. She is adopted, family history unknown.  Today she feels well, she has lingering cough from recent URI. No fever or chills. She is fatigued at baseline due to busy job, she works 14-hour days, 60 hours per week, working at [employer] in [place]. She tolerated breast biopsy well with mild bruising and swelling.   GYN HISTORY  Menarchal: age 11 LMP: age [age] Contraceptive: age [age] OCP HRT: None G1P1   CURRENT THERAPY : Neoadjuvant chemo AC q2 weeks x4 cycles starting 10/22/17-12/03/17, followed by weekly taxol x12 starting 12/17/17  INTERVAL HISTORY   Rainelle [name] Gertsch is here for a follow up. She is here alone. She is tolerating chemo very well and denies side effects. She is trying to lose wight and states that she has not lost a lot of weight with chemo. Her energy level is good and she is able to go to work. She reports very mild and tolerable neuropathy in all her extremities. She also reports cold  sensitivity.    MEDICAL HISTORY:  Past Medical History:  Diagnosis Date  . Adopted   . Arthritis   . breast ca dx'd 08/2017  . Genetic testing 11/14/2017   Multi-Cancer panel (83 genes) @ [facility] - Pathogenic mutation in the MITF gene  . GERD (gastroesophageal reflux disease)   . 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 IR IMAGING GUIDED PORT INSERTION  10/20/2017  . IR US GUIDE VASC ACCESS LEFT  10/20/2017  . TONSILLECTOMY Bilateral   . TUBAL LIGATION      SOCIAL HISTORY: Social History   Socioeconomic History  . Marital status: Significant Other    Spouse name: Not on file  . Number of children: 1  . Years of education: Not on file  . Highest education level: Not on file  Occupational History  . Not on file  Social Needs  . Financial resource strain: Not on file  . Food insecurity:    Worry: Not on file    Inability:   Not on file  . Transportation needs:    Medical: Not on file    Non-medical: Not on file  Tobacco Use  . Smoking status: Current Every Day Smoker    Packs/day: 0.50    Years: 51.00    Pack years: 25.50    Types: Cigarettes  . Smokeless tobacco: Never Used  . Tobacco comment: Working on quitting.  Substance and Sexual Activity  . Alcohol use: No    Comment: stopped 08/2017   . Drug use: Yes    Types: Marijuana    Comment: daily, since age 80   . Sexual activity: Not Currently  Lifestyle  . Physical activity:    Days per week: 7 days    Minutes per session: 80 min  . Stress: Very much  Relationships  . Social connections:    Talks on phone: Never    Gets together: Never    Attends religious service: Never    Active member of club or organization: No    Attends meetings of clubs or organizations: Never    Relationship status: Living with partner  . Intimate partner violence:    Fear of current or ex  partner: No    Emotionally abused: No    Physically abused: No    Forced sexual activity: No  Other Topics Concern  . Not on file  Social History Narrative  . Not on file    FAMILY HISTORY: Family History  Adopted: Yes  Problem Relation Age of Onset  . Congestive Heart Failure Mother   . Congestive Heart Failure Father     ALLERGIES:  is allergic to bee venom.  MEDICATIONS:  Current Outpatient Medications  Medication Sig Dispense Refill  . Coenzyme Q10 100 MG capsule Take 100 mg by mouth daily.     [name] lidocaine-prilocaine (EMLA) cream Apply to affected area once 30 g 3  . vitamin B-12 (CYANOCOBALAMIN) 1000 MCG tablet Take 1,000 mcg by mouth daily.    . ondansetron (ZOFRAN) 8 MG tablet Take 1 tablet (8 mg total) by mouth 2 (two) times daily as needed. Start on the third day after chemotherapy. (Patient not taking: Reported on 01/28/2018) 30 tablet 1  . prochlorperazine (COMPAZINE) 10 MG tablet Take 1 tablet (10 mg total) by mouth every 6 (six) hours as needed (Nausea or vomiting). (Patient not taking: Reported on 01/14/2018) 60 tablet 1   No current facility-administered medications for this visit.     REVIEW OF SYSTEMS:   Constitutional: Denies fevers, chills or abnormal night sweats   Eyes: Denies blurriness of vision, double vision or watery eyes Ears, nose, mouth, throat, and face: Denies mucositis or sore throat Respiratory: Denies cough, dyspnea or wheezes Cardiovascular: Denies palpitation, chest discomfort or lower extremity swelling Gastrointestinal:  Denies nausea, heartburn   Skin: Denies abnormal skin rashes   Breast: (+) Reduced right nipple inversion and softer mass Lymphatics: Denies new lymphadenopathy or easy bruising Neurological:Denie new weaknesses (+) cold sensitivity (+) very mild and tolerable neuropathy  Behavioral/Psych: Mood is stable, no new changes  All other systems were reviewed with the patient and are negative.  PHYSICAL EXAMINATION:  ECOG  PERFORMANCE STATUS: 0 - Asymptomatic  Vitals:   01/28/18 0910  BP: (!) 168/87  Pulse: 76  Resp: 17  Temp: 98.4 F (36.9 C)  SpO2: 100%   Filed Weights   01/28/18 0910  Weight: 170 lb 6.4 oz (77.3 kg)    GENERAL:alert, no distress and comfortable SKIN: skin color, texture, turgor are  normal, no rashes or significant lesions EYES: normal, conjunctiva are pink and non-injected, sclera clear OROPHARYNX:no exudate, no erythema and lips, buccal mucosa, and tongue normal  NECK: supple, thyroid normal size, non-tender, without nodularity LYMPH:  no palpable lymphadenopathy in the cervical, axillary or inguinal LUNGS: clear to auscultation and percussion with normal breathing effort HEART: regular rate & rhythm and no murmurs and no lower extremity edema ABDOMEN:abdomen soft, non-tender and normal bowel sounds Musculoskeletal:no cyanosis of digits and no clubbing  PSYCH: alert & oriented x 3 with fluent speech NEURO: no focal motor/sensory deficits BREAST: Skin hyperpigmentation of right nipple, 5.5x6 cm mass (previously 6x7cm) in center of right breast, now softer. She previously had a 1 x 1.5 cm mobile nodule to her right axilla, not palpable anymore. No palpable mass in left breast. Mild skin edema. No ulcers, bleeding, or discharge.    LABORATORY DATA:  I have reviewed the data as listed CBC Latest Ref Rng & Units 01/28/2018 01/21/2018 01/14/2018  WBC 3.9 - 10.3 K/uL 4.4 4.7 5.2  Hemoglobin 11.6 - 15.9 g/dL 11.8 10.9(L) 11.1(L)  Hematocrit 34.8 - 46.6 % 34.0(L) 32.5(L) 33.4(L)  Platelets 145 - 400 K/uL 194 179 163    CMP Latest Ref Rng & Units 01/28/2018 01/21/2018 01/14/2018  Glucose 70 - 99 mg/dL 112(H) 91 99  BUN 8 - 23 mg/dL _0 Creatinine 0.44 - 1.00 mg/dL 0.70 0.70 0.73  Sodium 135 - 145 mmol/L 142 142 141  Potassium 3.5 - 5.1 mmol/L 4.0 3.9 3.9  Chloride 98 - 111 mmol/L 108 108 108  CO2 22 - 32 mmol/L _1 Calcium 8.9 - 10.3 mg/dL 8.6(L) 8.8(L) 8.7(L)  Total  Protein 6.5 - 8.1 g/dL 6.0(L) 6.0(L) 6.1(L)  Total Bilirubin 0.3 - 1.2 mg/dL 0.3 0.3 0.4  Alkaline Phos 38 - 126 U/L 60 55 56  AST 15 - 41 U/L 16 14(L) 15  ALT 0 - 44 U/L _2 Tumor Markers CA 27.29 Results for JACELYNN, HAYTON [name] "[name]" (MRN [id number]) as of 01/24/2018 14:27  Ref. Range 11/06/2017 09:45 12/03/2017 09:35 12/31/2017 11:44 01/21/2018 12:22  CA 27.29 Latest Ref Range: 0.0 - 38.6 U/mL 53.4 (H) 61.4 (H) 43.9 (H) 23.1    PATHOLOGY  Diagnosis 10/13/17 1. Breast, right, needle core biopsy, 1 o'clock - INVASIVE DUCTAL CARCINOMA. - SEE COMMENT. 2. Breast, right, needle core biopsy, 7 o'clock - INVASIVE DUCTAL CARCINOMA. - SEE COMMENT. Microscopic Comment 1. and 2. The carcinoma in parts 1 and 2 is morphologically similar and appears grade II. Because breast prognostic profiles were performed on the patients previous case, [id number], it will not be repeated on the current case unless requested.   Diagnosis 09/23/17 1. Breast, left, needle core biopsy, 2 o'clock - INVASIVE DUCTAL CARCINOMA. - SEE COMMENT. 2. Breast, right, needle core biopsy, 11:30 o'clock - INVASIVE MAMMARY CARCINOMA. - SEE COMMENT. 3. Lymph node, needle/core biopsy, right axilla - METASTATIC MAMMARY CARCINOMA IN 1 OF 1 LYMPH NODE (1/1). Microscopic Comment 1. There is a small focus of grade I invasive ductal carcinoma. A complete breast prognostic profile will be attempted and the results reported separately. The results were called to the [facility] on 09/24/2017. 2. The carcinoma appears grade II. An E-Cadherin stain and a breast prognostic profile will be performed on part 2 and the results reported separately. (JBK:kh 09-24-17) [name] 1. HER2 Negative by FISH Estrogen receptor: 100% positive, strong staining Progesterone receptor: 80% positive, strong staining  Ki67: 5% 2. HER2 Negative by FISH Estrogen receptor: 100% positive, strong staining Progesterone receptor: 0%,  negative  Ki67: 30% 2. The tumor cells are strongly positive for E-cadherin, supporting a ductal phenotype   RADIOGRAPHIC STUDIES: I have personally reviewed the radiological images as listed and agreed with the findings in the report.  01/22/2018 MRI Bilateral Breast IMPRESSION: 1. Dominant enhancing mass with surrounding nodularity involving the central anterior third of the right breast now measures up to 6.4 x 6.7 x 7.6 cm (previously measured 6.8 x 9.6 x 7.2 cm). Persistent bulky right axillary lymphadenopathy, slightly decreased in size since prior MRI.  2. Biopsy proven left breast malignancy now measures 0.5 cm (previously measured 0.9 x 0.8 x 0.7 cm).    ASSESSMENT & PLAN:  Kylani Wires is a 61 y.o. Caucasian female with history of substance use presented with right nipple inversion for 3 months.    1. 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 She has locally advanced right breast cancer with a dominant 9.6 cm mass involving all 4 quadrants of the breast involving 5 pathologic axillary lymph nodes. She has a small left side breast cancer as well. She saw Dr. Dalbert Batman who discussed surgical options, including right mastectomy and possible left breast conserving surgery with subsequent radiation. She also met with Dr. Lisbeth Renshaw. I previously discussed the advantages of neoadjuvant chemotherapy and recommends AC x4 followed by weekly taxol x12 cycles. She is active and relatively healthy, she appears to be a good candidate for chemotherapy  -Her baseline echo from 10/15/17 shows grade 1  diastolic dysfunction, EF 88-82%, GLS -21% -She completed AC every 2 weeks 10/22/17-12/03/17. She tolerated well with mild bone pain from neulasta, mucositosis and intermittent fatigue.  -Her clinical breast exam and 12/16/17 interim mammo and US reveal stable to minimal decrease in size of multiple irregular masses in the right breast, stable right axillary lymph nodes, and stable left breast mass. Her disease does not appear to be very sensitive to chemotherapy. -She started weekly taxol on 12/17/17 and is tolerating very well with no significant side effects. She previously experienced sweating during infusion. Will monitor.  -Her repeated breast MRI on January 22, 2018 showed slightly decreased size of her right breast tumor.  Her tumor marker has came down to normal also, she is clinically tolerating chemotherapy very well, I recommend her to continue and complete as planned (6 more weeks). -She likely need a right mastectomy and axillary lymph node dissection, and she plans to have breast reconstruction.  I will refer her to plastic surgeon [name] to be seen in the next few weeks. -I will also send a message to her breast surgeon Dr. Dalbert Batman and updated him, he will see her back in a few weeks to prepare her surgery  -Labs reviewed and adequate to proceed with Taxol today.  -She is expected to go for surgery in October, and will be set for radiation after that. -F/u in 2 weeks    2. Social support -She works 60 hours per week at her physically demanding job. I informed her she may need to take time off  during her intensive chemotherapy schedule. She plans to apply for medicaid. I previously referred her to social work for ongoing f/u.  -She has met with our financial office, she is working on getting medicaid.   3. Substance use -She has been smoking cigarettes and marijuana for decades. I encouraged her to quit completely.  -She mentioned interest in herbal and holistic diet, I previously  recommended that she avoid herbal supplements while on chemotherapy and endocrine therapy due to potential drug interactions, she understands.  -She now smokes 3 cigarettes a day. I previously offered her the nicotine patch and chantix to which the patient declines this at this time.  -I again encouraged her to quit completely   4. Insomnia -She works long hours and has interrupted sleep.  -I previously recommended benadryl and or melatonin OTC first. She agreed.   5.  Hypertension  -Her BP has been elevated during her visits. She has not checked at home. I advise her to reduce salt intake, increase water intake and if BP high in future I will start her on hypertension medication.  -I previously discussed with the patient to continue with decreasing sodium intake as well as continue with exercise to aid with elevated blood pressure readings.  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  I encouraged her to use ice bag for her hands and feet during chemo infusion.  7. 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  -I encouraged her to avoid too much direct sunlight and use stronger SPF Suncreen given her MITF gene mutation.    PLAN: -breast MRI reviewed -Lab reviewed, adequate for treatment, will proceed to cycle 6 taxol today and continue weekly  -f/u in 2 weeks   -Refer to plastic surgeon Dr. Thimmappa, I also updated Dr. Ingram who will see her in a few weeks     All questions were answered. The patient knows to call the clinic with any problems, questions or concerns.  I spent 20 minutes counseling  the patient face to face. The total time spent in the appointment was 25 minutes and more than 50% was on counseling.  I, Noor Dweik am acting as scribe for Dr. Yan Feng.  I have reviewed the above documentation for accuracy and completeness, and I agree with the above.      Yan Feng, MD 01/28/2018           

## 2018-01-28 ENCOUNTER — Inpatient Hospital Stay: Payer: Medicaid Other

## 2018-01-28 ENCOUNTER — Encounter: Payer: Self-pay | Admitting: Hematology

## 2018-01-28 ENCOUNTER — Other Ambulatory Visit: Payer: Medicaid Other

## 2018-01-28 ENCOUNTER — Inpatient Hospital Stay (HOSPITAL_BASED_OUTPATIENT_CLINIC_OR_DEPARTMENT_OTHER): Payer: Medicaid Other | Admitting: Hematology

## 2018-01-28 ENCOUNTER — Ambulatory Visit: Payer: Medicaid Other

## 2018-01-28 VITALS — BP 168/87 | HR 76 | Temp 98.4°F | Resp 17 | Ht 64.0 in | Wt 170.4 lb

## 2018-01-28 DIAGNOSIS — Z170 Estrogen receptor positive status [ER+]: Principal | ICD-10-CM

## 2018-01-28 DIAGNOSIS — F17210 Nicotine dependence, cigarettes, uncomplicated: Secondary | ICD-10-CM

## 2018-01-28 DIAGNOSIS — I10 Essential (primary) hypertension: Secondary | ICD-10-CM

## 2018-01-28 DIAGNOSIS — C50912 Malignant neoplasm of unspecified site of left female breast: Secondary | ICD-10-CM | POA: Diagnosis not present

## 2018-01-28 DIAGNOSIS — C50811 Malignant neoplasm of overlapping sites of right female breast: Secondary | ICD-10-CM

## 2018-01-28 DIAGNOSIS — G4700 Insomnia, unspecified: Secondary | ICD-10-CM

## 2018-01-28 DIAGNOSIS — G620 Drug-induced polyneuropathy: Secondary | ICD-10-CM

## 2018-01-28 DIAGNOSIS — Z5111 Encounter for antineoplastic chemotherapy: Secondary | ICD-10-CM | POA: Diagnosis not present

## 2018-01-28 DIAGNOSIS — C50412 Malignant neoplasm of upper-outer quadrant of left female breast: Secondary | ICD-10-CM

## 2018-01-28 DIAGNOSIS — C773 Secondary and unspecified malignant neoplasm of axilla and upper limb lymph nodes: Secondary | ICD-10-CM | POA: Diagnosis not present

## 2018-01-28 DIAGNOSIS — Z95828 Presence of other vascular implants and grafts: Secondary | ICD-10-CM

## 2018-01-28 DIAGNOSIS — I429 Cardiomyopathy, unspecified: Secondary | ICD-10-CM

## 2018-01-28 LAB — CBC WITH DIFFERENTIAL (CANCER CENTER ONLY)
Basophils Absolute: 0 10*3/uL (ref 0.0–0.1)
Basophils Relative: 1 %
Eosinophils Absolute: 0.2 10*3/uL (ref 0.0–0.5)
Eosinophils Relative: 4 %
HEMATOCRIT: 34 % — AB (ref 34.8–46.6)
HEMOGLOBIN: 11.8 g/dL (ref 11.6–15.9)
Lymphocytes Relative: 24 %
Lymphs Abs: 1.1 10*3/uL (ref 0.9–3.3)
MCH: 34.3 pg — ABNORMAL HIGH (ref 25.1–34.0)
MCHC: 34.6 g/dL (ref 31.5–36.0)
MCV: 99.1 fL (ref 79.5–101.0)
MONO ABS: 0.2 10*3/uL (ref 0.1–0.9)
MONOS PCT: 6 %
NEUTROS ABS: 2.9 10*3/uL (ref 1.5–6.5)
Neutrophils Relative %: 65 %
Platelet Count: 194 10*3/uL (ref 145–400)
RBC: 3.43 MIL/uL — ABNORMAL LOW (ref 3.70–5.45)
RDW: 16.9 % — AB (ref 11.2–14.5)
WBC Count: 4.4 10*3/uL (ref 3.9–10.3)

## 2018-01-28 LAB — CMP (CANCER CENTER ONLY)
ALBUMIN: 3.6 g/dL (ref 3.5–5.0)
ALT: 17 U/L (ref 0–44)
ANION GAP: 8 (ref 5–15)
AST: 16 U/L (ref 15–41)
Alkaline Phosphatase: 60 U/L (ref 38–126)
BUN: 17 mg/dL (ref 8–23)
CHLORIDE: 108 mmol/L (ref 98–111)
CO2: 26 mmol/L (ref 22–32)
Calcium: 8.6 mg/dL — ABNORMAL LOW (ref 8.9–10.3)
Creatinine: 0.7 mg/dL (ref 0.44–1.00)
GFR, Est AFR Am: >60 mL/min (ref >60)
GFR, Estimated: >60 mL/min (ref >60)
GLUCOSE: 112 mg/dL — AB (ref 70–99)
Potassium: 4 mmol/L (ref 3.5–5.1)
SODIUM: 142 mmol/L (ref 135–145)
Total Bilirubin: 0.3 mg/dL (ref 0.3–1.2)
Total Protein: 6 g/dL — ABNORMAL LOW (ref 6.5–8.1)

## 2018-01-28 MED ORDER — ALTEPLASE 2 MG IJ SOLR
2.0000 mg | Freq: Once | INTRAMUSCULAR | Status: AC
  Administered 2018-01-28: 2 mg
  Filled 2018-01-28: qty 2

## 2018-01-28 MED ORDER — ALTEPLASE 2 MG IJ SOLR
INTRAMUSCULAR | Status: AC
  Filled 2018-01-28: qty 2

## 2018-01-28 MED ORDER — SODIUM CHLORIDE 0.9 % IV SOLN
Freq: Once | INTRAVENOUS | Status: AC
  Administered 2018-01-28: 10:00:00 via INTRAVENOUS
  Filled 2018-01-28: qty 250

## 2018-01-28 MED ORDER — SODIUM CHLORIDE 0.9 % IV SOLN
80.0000 mg/m2 | Freq: Once | INTRAVENOUS | Status: AC
  Administered 2018-01-28: 150 mg via INTRAVENOUS
  Filled 2018-01-28: qty 25

## 2018-01-28 MED ORDER — FAMOTIDINE IN NACL 20-0.9 MG/50ML-% IV SOLN
20.0000 mg | Freq: Once | INTRAVENOUS | Status: AC
  Administered 2018-01-28: 20 mg via INTRAVENOUS

## 2018-01-28 MED ORDER — HEPARIN SOD (PORK) LOCK FLUSH 100 UNIT/ML IV SOLN
500.0000 [IU] | Freq: Once | INTRAVENOUS | Status: AC | PRN
  Administered 2018-01-28: 500 [IU]
  Filled 2018-01-28: qty 5

## 2018-01-28 MED ORDER — DIPHENHYDRAMINE HCL 50 MG/ML IJ SOLN
INTRAMUSCULAR | Status: AC
  Filled 2018-01-28: qty 1

## 2018-01-28 MED ORDER — SODIUM CHLORIDE 0.9 % IV SOLN
20.0000 mg | Freq: Once | INTRAVENOUS | Status: AC
  Administered 2018-01-28: 20 mg via INTRAVENOUS
  Filled 2018-01-28: qty 2

## 2018-01-28 MED ORDER — DIPHENHYDRAMINE HCL 50 MG/ML IJ SOLN
50.0000 mg | Freq: Once | INTRAMUSCULAR | Status: AC
  Administered 2018-01-28: 50 mg via INTRAVENOUS

## 2018-01-28 MED ORDER — SODIUM CHLORIDE 0.9% FLUSH
10.0000 mL | INTRAVENOUS | Status: DC | PRN
  Administered 2018-01-28: 10 mL
  Filled 2018-01-28: qty 10

## 2018-01-28 MED ORDER — SODIUM CHLORIDE 0.9% FLUSH
10.0000 mL | Freq: Once | INTRAVENOUS | Status: AC
  Administered 2018-01-28: 10 mL
  Filled 2018-01-28: qty 10

## 2018-01-28 MED ORDER — FAMOTIDINE IN NACL 20-0.9 MG/50ML-% IV SOLN
INTRAVENOUS | Status: AC
  Filled 2018-01-28: qty 50

## 2018-01-28 NOTE — Patient Instructions (Signed)
[facility] Discharge Instructions for Patients Receiving Chemotherapy  Today you received the following chemotherapy agents paclitaxel (Taxol) To help prevent nausea and vomiting after your treatment, we encourage you to take your nausea medication as directed   If you develop nausea and vomiting that is not controlled by your nausea medication, call the clinic.   BELOW ARE SYMPTOMS THAT SHOULD BE REPORTED IMMEDIATELY:  *FEVER GREATER THAN 100.5 F  *CHILLS WITH OR WITHOUT FEVER  NAUSEA AND VOMITING THAT IS NOT CONTROLLED WITH YOUR NAUSEA MEDICATION  *UNUSUAL SHORTNESS OF BREATH  *UNUSUAL BRUISING OR BLEEDING  TENDERNESS IN MOUTH AND THROAT WITH OR WITHOUT PRESENCE OF ULCERS  *URINARY PROBLEMS  *BOWEL PROBLEMS  UNUSUAL RASH Items with * indicate a potential emergency and should be followed up as soon as possible.  Feel free to call the clinic should you have any questions or concerns. The clinic phone number is [phone].  Please show the CHEMO ALERT CARD at check-in to the Emergency Department and triage nurse.   

## 2018-01-29 ENCOUNTER — Telehealth: Payer: Self-pay | Admitting: Hematology

## 2018-01-29 NOTE — Telephone Encounter (Signed)
Appts scheduled and patient notified per 8/22 los

## 2018-02-04 ENCOUNTER — Inpatient Hospital Stay (HOSPITAL_BASED_OUTPATIENT_CLINIC_OR_DEPARTMENT_OTHER): Payer: Medicaid Other | Admitting: Nurse Practitioner

## 2018-02-04 ENCOUNTER — Inpatient Hospital Stay: Payer: Medicaid Other

## 2018-02-04 ENCOUNTER — Encounter: Payer: Self-pay | Admitting: Nurse Practitioner

## 2018-02-04 VITALS — BP 154/79 | HR 88 | Temp 97.8°F | Resp 18 | Ht 64.0 in | Wt 168.0 lb

## 2018-02-04 DIAGNOSIS — Z170 Estrogen receptor positive status [ER+]: Principal | ICD-10-CM

## 2018-02-04 DIAGNOSIS — Z5111 Encounter for antineoplastic chemotherapy: Secondary | ICD-10-CM | POA: Diagnosis not present

## 2018-02-04 DIAGNOSIS — C50811 Malignant neoplasm of overlapping sites of right female breast: Secondary | ICD-10-CM

## 2018-02-04 DIAGNOSIS — F17210 Nicotine dependence, cigarettes, uncomplicated: Secondary | ICD-10-CM

## 2018-02-04 DIAGNOSIS — C773 Secondary and unspecified malignant neoplasm of axilla and upper limb lymph nodes: Secondary | ICD-10-CM | POA: Diagnosis not present

## 2018-02-04 DIAGNOSIS — I429 Cardiomyopathy, unspecified: Secondary | ICD-10-CM

## 2018-02-04 DIAGNOSIS — G620 Drug-induced polyneuropathy: Secondary | ICD-10-CM

## 2018-02-04 DIAGNOSIS — Z9221 Personal history of antineoplastic chemotherapy: Secondary | ICD-10-CM

## 2018-02-04 DIAGNOSIS — C50912 Malignant neoplasm of unspecified site of left female breast: Secondary | ICD-10-CM

## 2018-02-04 DIAGNOSIS — C50412 Malignant neoplasm of upper-outer quadrant of left female breast: Secondary | ICD-10-CM

## 2018-02-04 DIAGNOSIS — Z95828 Presence of other vascular implants and grafts: Secondary | ICD-10-CM

## 2018-02-04 DIAGNOSIS — G4700 Insomnia, unspecified: Secondary | ICD-10-CM

## 2018-02-04 DIAGNOSIS — I10 Essential (primary) hypertension: Secondary | ICD-10-CM

## 2018-02-04 LAB — CMP (CANCER CENTER ONLY)
ALK PHOS: 56 U/L (ref 38–126)
ALT: 18 U/L (ref 0–44)
AST: 18 U/L (ref 15–41)
Albumin: 3.7 g/dL (ref 3.5–5.0)
Anion gap: 6 (ref 5–15)
BUN: 20 mg/dL (ref 8–23)
CALCIUM: 9.1 mg/dL (ref 8.9–10.3)
CO2: 29 mmol/L (ref 22–32)
CREATININE: 0.76 mg/dL (ref 0.44–1.00)
Chloride: 109 mmol/L (ref 98–111)
Glucose, Bld: 109 mg/dL — ABNORMAL HIGH (ref 70–99)
Potassium: 3.7 mmol/L (ref 3.5–5.1)
Sodium: 144 mmol/L (ref 135–145)
Total Bilirubin: 0.4 mg/dL (ref 0.3–1.2)
Total Protein: 6.1 g/dL — ABNORMAL LOW (ref 6.5–8.1)

## 2018-02-04 LAB — CBC WITH DIFFERENTIAL (CANCER CENTER ONLY)
Basophils Absolute: 0.1 10*3/uL (ref 0.0–0.1)
Basophils Relative: 1 %
EOS ABS: 0.1 10*3/uL (ref 0.0–0.5)
Eosinophils Relative: 2 %
HCT: 36.1 % (ref 34.8–46.6)
HEMOGLOBIN: 12.1 g/dL (ref 11.6–15.9)
Lymphocytes Relative: 24 %
Lymphs Abs: 1.1 10*3/uL (ref 0.9–3.3)
MCH: 33.6 pg (ref 25.1–34.0)
MCHC: 33.6 g/dL (ref 31.5–36.0)
MCV: 100 fL (ref 79.5–101.0)
Monocytes Absolute: 0.3 10*3/uL (ref 0.1–0.9)
Monocytes Relative: 6 %
NEUTROS PCT: 67 %
Neutro Abs: 3 10*3/uL (ref 1.5–6.5)
Platelet Count: 215 10*3/uL (ref 145–400)
RBC: 3.61 MIL/uL — AB (ref 3.70–5.45)
RDW: 15.8 % — ABNORMAL HIGH (ref 11.2–14.5)
WBC: 4.5 10*3/uL (ref 3.9–10.3)

## 2018-02-04 MED ORDER — SODIUM CHLORIDE 0.9% FLUSH
10.0000 mL | INTRAVENOUS | Status: DC | PRN
  Administered 2018-02-04: 10 mL
  Filled 2018-02-04: qty 10

## 2018-02-04 MED ORDER — DIPHENHYDRAMINE HCL 50 MG/ML IJ SOLN
50.0000 mg | Freq: Once | INTRAMUSCULAR | Status: AC
  Administered 2018-02-04: 50 mg via INTRAVENOUS

## 2018-02-04 MED ORDER — FAMOTIDINE IN NACL 20-0.9 MG/50ML-% IV SOLN
INTRAVENOUS | Status: AC
  Filled 2018-02-04: qty 50

## 2018-02-04 MED ORDER — SODIUM CHLORIDE 0.9 % IV SOLN
80.0000 mg/m2 | Freq: Once | INTRAVENOUS | Status: AC
  Administered 2018-02-04: 150 mg via INTRAVENOUS
  Filled 2018-02-04: qty 25

## 2018-02-04 MED ORDER — SODIUM CHLORIDE 0.9 % IV SOLN
Freq: Once | INTRAVENOUS | Status: AC
  Administered 2018-02-04: 14:00:00 via INTRAVENOUS
  Filled 2018-02-04: qty 250

## 2018-02-04 MED ORDER — SODIUM CHLORIDE 0.9% FLUSH
10.0000 mL | Freq: Once | INTRAVENOUS | Status: AC
  Administered 2018-02-04: 10 mL
  Filled 2018-02-04: qty 10

## 2018-02-04 MED ORDER — SODIUM CHLORIDE 0.9 % IV SOLN
20.0000 mg | Freq: Once | INTRAVENOUS | Status: AC
  Administered 2018-02-04: 20 mg via INTRAVENOUS
  Filled 2018-02-04: qty 2

## 2018-02-04 MED ORDER — FAMOTIDINE IN NACL 20-0.9 MG/50ML-% IV SOLN
20.0000 mg | Freq: Once | INTRAVENOUS | Status: AC
  Administered 2018-02-04: 20 mg via INTRAVENOUS

## 2018-02-04 MED ORDER — DIPHENHYDRAMINE HCL 50 MG/ML IJ SOLN
INTRAMUSCULAR | Status: AC
  Filled 2018-02-04: qty 1

## 2018-02-04 MED ORDER — HEPARIN SOD (PORK) LOCK FLUSH 100 UNIT/ML IV SOLN
500.0000 [IU] | Freq: Once | INTRAVENOUS | Status: AC | PRN
  Administered 2018-02-04: 500 [IU]
  Filled 2018-02-04: qty 5

## 2018-02-04 NOTE — Progress Notes (Signed)
Darlene Beasley  Telephone:([phone]) [phone] Fax:([phone]) [phone]  Clinic Follow up Note   Patient Care Team: [name], MD as PCP - General (Family Medicine) [name], MD as Consulting Physician (General Surgery) [name], MD as Consulting Physician (Hematology) 02/04/2018  SUMMARY OF ONCOLOGIC HISTORY: Oncology History   Cancer Staging Cancer of overlapping sites of right female breast [facility]) Staging form: Breast, AJCC 8th Edition - Clinical stage from 09/23/2017: Stage IIIA (cT3, cN1, cM0, G2, ER+, PR-, HER2-) - Signed by [name], MD on 10/09/2017 - Clinical: cT3 - Unsigned       Cancer of overlapping sites of right female breast ([place])   09/09/2017 Mammogram    IMPRESSION: 1. Highly suspicious large right breast mass involving the outer and upper breast extending from approximately 9 o'clock through 12-1 o'clock, maximum measurement approximating 7.5 cm. The mass involves the right nipple, accounting for nipple retraction. Architectural distortion, microcalcifications and diffuse trabecular thickening and skin thickening are associated with the large mass, raising the possibility of this representing an inflammatory cancer. 2. Satellite masses separate from the dominant mass involving the lower outer quadrant and the upper inner quadrant of the right breast, measured above. 3. Two adjacent pathologic right axillary lymph nodes. 4. Indeterminate 0.9 cm solid mass involving the upper outer quadrant of the left breast which accounts for a mammographic finding. 5. No pathologic left axillary lymphadenopathy.    09/09/2017 Breast US    Targeted right breast ultrasound is performed, showing a very large hypoechoic mass with irregular margins extending from the approximate 9 o'clock position through the 12 to 1 o'clock position. On the CC gait image, the mass measures maximally approximately 7.5 cm. The mass has irregular margins, demonstrates acoustic shadowing, and  demonstrates internal power Doppler flow. The mass extends into the retroareolar region and involves the nipple, accounting for the nipple retraction.  Separate from the dominant mass at the 7 o'clock position approximately 4 cm from nipple is a hypoechoic antiparallel mass with irregular margins measuring approximately 1.4 x 2.7 x 1.7 cm.  Separate from the dominant mass at the 1 o'clock position approximately 2 cm from nipple is a hypoechoic mass with irregular margins measuring approximately 1.7 x 0.5 x 1.6 cm. Both of these masses also demonstrate internal power Doppler flow.  Sonographic evaluation of the right axilla demonstrates 2 adjacent pathologic lymph nodes, the larger measuring approximately 2.5 x 2.5 x 2.8 cm, the smaller measuring approximately 2.0 x 0.8 x 2.7 cm.    09/23/2017 Initial Biopsy    Diagnosis 1. Breast, left, needle core biopsy, 2 o'clock - INVASIVE DUCTAL CARCINOMA. - SEE COMMENT. 2. Breast, right, needle core biopsy, 11:30 o'clock - INVASIVE MAMMARY CARCINOMA. - SEE COMMENT. 3. Lymph node, needle/core biopsy, right axilla - METASTATIC MAMMARY CARCINOMA IN 1 OF 1 LYMPH NODE (1/1). Microscopic Comment 1. There is a small focus of grade I invasive ductal carcinoma. A complete breast prognostic profile will be attempted and the results reported separately. The results were called to the [place] on 09/24/2017. 2. The carcinoma appears grade II. An E-Cadherin stain and a breast prognostic profile will be performed on part 2 and the results reported separately. (JBK:kh 09-24-17) Darlene Beasley  1. HER2 Negative by FISH Estrogen receptor: 100% positive, strong staining Progesterone receptor: 80% positive, strong staining Ki67: 5%  2. HER2 Negative by FISH Estrogen receptor: 100% positive, strong staining Progesterone receptor: 0%, negative  Ki67: 30% 2. The tumor cells are strongly positive  for E-cadherin, supporting a ductal phenotype     09/23/2017 Cancer Staging    Staging form: Breast, AJCC 8th Edition - Clinical stage from 09/23/2017: Stage IIIA (cT3, cN1, cM0, G2, ER+, PR-, HER2-) - Signed by [name], MD on 10/09/2017    10/07/2017 Breast MRI    IMPRESSION: 1. Biopsy proven malignancy involving all 4 quadrants of the right breast, although predominantly located in the anterior third of the central upper right breast measures 6.8 x 9.6 x 7.2 cm.  2. Five pathologic right axillary lymph nodes, compatible with biopsy proven axillary metastases.  3. Small mass with associated biopsy related changes in the upper-outer left breast at site of known malignancy measures 0.9 x 0.8 x 0.7 cm. No abnormal lymph nodes seen in the left axilla.  RECOMMENDATION: Treatment plan for known bilateral breast malignancy.  BI-RADS CATEGORY  6: Known biopsy-proven malignancy.    10/08/2017 Initial Diagnosis    Cancer of overlapping sites of right female breast ([place])    10/13/2017 Pathology Results    Diagnosis 1. Breast, right, needle core biopsy, 1 o'clock - INVASIVE DUCTAL CARCINOMA. - SEE COMMENT. 2. Breast, right, needle core biopsy, 7 o'clock - INVASIVE DUCTAL CARCINOMA. - SEE COMMENT. Microscopic Comment 1. and 2. The carcinoma in parts 1 and 2 is morphologically similar and appears grade II. Because breast prognostic profiles were performed on the patients previous case, [id number], it will not be repeated on the current case unless requested.     10/17/2017 Imaging    CT CAP IMPRESSION: 1. Infiltrative right breast mass with overlying skin thickening is identified compatible with known breast cancer. 2. Enlarged right axillary lymph nodes compatible with metastatic adenopathy. 3. No additional sites of disease identified within the chest and no evidence for metastasis to the abdomen or pelvis. 4. Aortic atherosclerosis and 3 vessel coronary artery atherosclerotic calcifications. Aortic Atherosclerosis (ICD10-I70.0).     10/17/2017 Imaging    Bone Scan FINDINGS: There are no foci of increased or decreased radiotracer uptake to suggest osseous metastatic disease. 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 There is degenerative type uptake within both shoulders and the left ankle.  Normal physiologic activity is identified within the kidneys and urinary bladder.  IMPRESSION: No evidence of osseous metastatic disease.    10/22/2017 -  Chemotherapy    AC q2 weeks x4 cycles starting 10/22/17-12/03/17, followed by weekly taxol x12 starting 12/17/17    11/14/2017 Genetic Testing    The genes that were analyzed were the 83 genes on [facility]'s Multi-Cancer panel (ALK, APC, ATM, AXIN2, BAP1, BARD1, BLM, BMPR1A, BRCA1, BRCA2, BRIP1, CASR, CDC73, CDH1, CDK4, CDKN1B, CDKN1C, CDKN2A, CEBPA, CHEK2, CTNNA1, DICER1, DIS3L2, EGFR, EPCAM, FH, FLCN, GATA2, GPC3, GREM1, HOXB13, HRAS, KIT, MAX, MEN1, MET, MITF, MLH1, MSH2, MSH3, MSH6, MUTYH, NBN, NF1, NF2, NTHL1, PALB2, PDGFRA, PHOX2B, PMS2, POLD1, POLE, POT1, PRKAR1A, PTCH1, PTEN, RAD50, RAD51C, RAD51D, RB1, RECQL4, RET, RUNX1, SDHA, SDHAF2, SDHB, SDHC, SDHD, SMAD4, SMARCA4, SMARCB1, SMARCE1, STK11, SUFU, TERC, TERT, TMEM127, TP53, TSC1, TSC2, VHL, WRN, WT1).  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    12/16/2017 Breast US    IMPRESSION: 1. Stable to minimal decrease in size of multiple irregular masses throughout the right breast consistent with the patient's sites of biopsy proven malignancy. While the mammographic appearance is slightly less dense in this  region, there is increased skin and trabecular thickening involving the entire right breast. 2. Stable appearance of morphologically abnormal right axillary lymph nodes, consistent with biopsy proven  metastatic disease. 3. Stable appearance of a left breast mass, consistent with biopsy proven malignancy.      Malignant neoplasm of left breast in female, estrogen receptor positive ([place])   09/09/2017 Breast US    Targeted left breast ultrasound is performed, showing an oval parallel hypoechoic mass containing microcalcifications at the 2 o'clock position approximately 6 cm from nipple measuring approximately 0.9 x 0.5 x 0.9 cm, corresponding to the mammographic finding.  Sonographic evaluation of the left axilla demonstrates no pathologic lymphadenopathy.    09/23/2017 Initial Biopsy    Diagnosis 1. Breast, left, needle core biopsy, 2 o'clock - INVASIVE DUCTAL CARCINOMA. - SEE COMMENT. 2. Breast, right, needle core biopsy, 11:30 o'clock - INVASIVE MAMMARY CARCINOMA. - SEE COMMENT. 3. Lymph node, needle/core biopsy, right axilla - METASTATIC MAMMARY CARCINOMA IN 1 OF 1 LYMPH NODE (1/1). Microscopic Comment 1. There is a small focus of grade I invasive ductal carcinoma. A complete breast prognostic profile will be attempted and the results reported separately. The results were called to the [place] on 09/24/2017. 2. The carcinoma appears grade II. An E-Cadherin stain and a breast prognostic profile will be performed on part 2 and the results reported separately. (JBK:kh 09-24-17) Darlene Beasley  1. HER2 Negative by FISH Estrogen receptor: 100% positive, strong staining Progesterone receptor: 80% positive, strong staining Ki67: 5%  2. HER2 Negative by FISH Estrogen receptor: 100% positive, strong staining Progesterone receptor: 0%, negative  Ki67: 30%    10/08/2017 Initial Diagnosis    Malignant neoplasm of left breast in female, estrogen receptor positive ([place])    12/16/2017 Breast US    IMPRESSION: 3. Stable appearance of a left breast mass, consistent with biopsy proven malignancy.     01/22/2018 Imaging    01/22/2018 MRI Bilateral Breast IMPRESSION: 1.  Dominant enhancing mass with surrounding nodularity involving the central anterior third of the right breast now measures up to 6.4 x 6.7 x 7.6 cm (previously measured 6.8 x 9.6 x 7.2 cm). Persistent bulky right axillary lymphadenopathy, slightly decreased in size since prior MRI.  2. Biopsy proven left breast malignancy now measures 0.5 cm (previously measured 0.9 x 0.8 x 0.7 cm).    CURRENT THERAPY : Neoadjuvant chemo AC q2 weeks x4 cycles starting 10/22/17-12/03/17, followed by weekly taxol x12 starting 12/17/17  INTERVAL HISTORY: Darlene Beasley returns for follow up as scheduled. She completed cycle 6 weekly taxol on 8/21. She continues to do well. Eating and drinking well. Continues to work. She has some soreness on her tongue with certain foods and hot drinks. Neuropathy to fingertips is stable. Continues B complex and cryotherapy. No functional limitations. She denies fever, chills, cough, chest pain, dyspnea, or changes in her breasts. She met with Dr. Dalbert Batman 8/27 who plans to do surgery approx 2 weeks after she completes chemo. She has appt with [name] next Friday.    MEDICAL HISTORY:  Past Medical History:  Diagnosis Date  . Adopted   . Arthritis   . breast ca dx'd 08/2017  . Genetic testing 11/14/2017   Multi-Cancer panel (83 genes) @ [facility] - Pathogenic mutation in the MITF gene  . GERD (gastroesophageal reflux disease)   . 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 IR IMAGING GUIDED PORT INSERTION  10/20/2017  . IR US GUIDE VASC ACCESS LEFT  10/20/2017  . TONSILLECTOMY Bilateral   . TUBAL LIGATION      I have reviewed the social history and family history with the patient and they are unchanged from previous note.  ALLERGIES:  is allergic to bee venom.  MEDICATIONS:  Current Outpatient Medications  Medication Sig Dispense Refill    . Coenzyme Q10 100 MG capsule Take 100 mg by mouth daily.     [name] lidocaine-prilocaine (EMLA) cream Apply to affected area once 30 g 3  . ondansetron (ZOFRAN) 8 MG tablet Take 1 tablet (8 mg total) by mouth 2 (two) times daily as needed. Start on the third day after chemotherapy. 30 tablet 1  . prochlorperazine (COMPAZINE) 10 MG tablet Take 1 tablet (10 mg total) by mouth every 6 (six) hours as needed (Nausea or vomiting). 60 tablet 1  . vitamin B-12 (CYANOCOBALAMIN) 1000 MCG tablet Take 1,000 mcg by mouth daily.     No current facility-administered medications for this visit.     PHYSICAL EXAMINATION: ECOG PERFORMANCE STATUS: 1 - Symptomatic but completely ambulatory  Vitals:   02/04/18 1215  BP: (!) 154/79  Pulse: 88  Resp: 18  Temp: 97.8 F (36.6 C)  SpO2: 99%   Filed Weights   02/04/18 1215  Weight: 168 lb (76.2 kg)    GENERAL:alert, no distress and comfortable SKIN: skin color, texture, turgor are normal, no rashes or significant lesions EYES: sclera clear OROPHARYNX:no thrush or ulcers. Geographic tongue LYMPH:  no palpable cervical or supraclavicular lymphadenopathy  LUNGS: clear to auscultation with normal breathing effort HEART: regular rate & rhythm and no murmurs and no lower extremity edema ABDOMEN:abdomen soft, non-tender and normal bowel sounds Musculoskeletal:no cyanosis of digits and no clubbing  NEURO: alert & oriented x 3 with fluent speech, no focal motor/sensory deficits Breast exam with stable 5.5x6 cm central right breast mass. No bilateral axillary adenopathy or left breast mass.  PAC without erythema   LABORATORY DATA:  I have reviewed the data as listed CBC Latest Ref Rng & Units 02/04/2018 01/28/2018 01/21/2018  WBC 3.9 - 10.3 K/uL 4.5 4.4 4.7  Hemoglobin 11.6 - 15.9 g/dL 12.1 11.8 10.9(L)  Hematocrit 34.8 - 46.6 % 36.1 34.0(L) 32.5(L)  Platelets 145 - 400 K/uL 215 194 179     CMP Latest Ref Rng & Units 02/04/2018 01/28/2018 01/21/2018  Glucose 70  - 99 mg/dL 109(H) 112(H) 91  BUN 8 - 23 mg/dL '20 17 22  '$ Creatinine 0.44 - 1.00 mg/dL 0.76 0.70 0.70  Sodium 135 - 145 mmol/L 144 142 142  Potassium 3.5 - 5.1 mmol/L 3.7 4.0 3.9  Chloride 98 - 111 mmol/L 109 108 108  CO2 22 - 32 mmol/L '29 26 24  '$ Calcium 8.9 - 10.3 mg/dL 9.1 8.6(L) 8.8(L)  Total Protein 6.5 - 8.1 g/dL 6.1(L) 6.0(L) 6.0(L)  Total Bilirubin 0.3 - 1.2 mg/dL 0.4 0.3 0.3  Alkaline Phos 38 - 126 U/L 56 60 55  AST 15 - 41 U/L 18 16 14(L)  ALT 0 - 44 U/L '18 17 18      '$ RADIOGRAPHIC STUDIES: I have personally reviewed the radiological images as listed and agreed with the findings in the report. No results found.   ASSESSMENT & PLAN: Darlene Beasley is a 62 y.o. Caucasian female with history of substance usepresented with right nipple inversion for 3 months.    1. 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 Social support 3. Substance use  4. Insomnia 5. HTN 6. Peripheral neuropathy to fingers, G1, secondary to taxol - started after cycle 2 taxol  7. 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 -[name] previously encouraged her to avoid too much direct sunlight and use stronger SPF Suncreen given her MITF gene mutation.   Darlene Beasley appears unchanged. She completed 6 cycles neoadjuvant taxol. She continues to tolerate treatment well overall. She saw her surgeon Dr. Dalbert Batman who plans to do right modified radical mastectomy and left total mastectomy with  SLNB vs lumpectomy with SLNB. He will see her back in 1 month to finalize the treatment plan. She will see [name] next week to discuss reconstruction. She will proceed to radiation after surgery.   Labs reviewed, adequate for her to receive cycle 7 taxol today and continue weekly. She will complete chemo 10/2. She will return next week for f/u and next cycle.   PLAN: -Labs reviewed, proceed with cycle 7 weekly taxol -F/u next week  -Continue cryotherapy and B complex for neuropathy  All questions were answered. The patient knows to call the clinic with any problems, questions or concerns. No barriers to learning was detected. I spent 20 minutes counseling the patient face to face. The total time spent in the appointment was 25 minutes and more than 50% was on counseling and review of test results     Darlene Feeling, NP 02/04/18

## 2018-02-04 NOTE — Patient Instructions (Signed)
[facility] Discharge Instructions for Patients Receiving Chemotherapy  Today you received the following chemotherapy agents paclitaxel (Taxol) To help prevent nausea and vomiting after your treatment, we encourage you to take your nausea medication as directed   If you develop nausea and vomiting that is not controlled by your nausea medication, call the clinic.   BELOW ARE SYMPTOMS THAT SHOULD BE REPORTED IMMEDIATELY:  *FEVER GREATER THAN 100.5 F  *CHILLS WITH OR WITHOUT FEVER  NAUSEA AND VOMITING THAT IS NOT CONTROLLED WITH YOUR NAUSEA MEDICATION  *UNUSUAL SHORTNESS OF BREATH  *UNUSUAL BRUISING OR BLEEDING  TENDERNESS IN MOUTH AND THROAT WITH OR WITHOUT PRESENCE OF ULCERS  *URINARY PROBLEMS  *BOWEL PROBLEMS  UNUSUAL RASH Items with * indicate a potential emergency and should be followed up as soon as possible.  Feel free to call the clinic should you have any questions or concerns. The clinic phone number is [phone].  Please show the CHEMO ALERT CARD at check-in to the Emergency Department and triage nurse.   

## 2018-02-05 ENCOUNTER — Telehealth: Payer: Self-pay | Admitting: Hematology

## 2018-02-05 NOTE — Telephone Encounter (Signed)
No LOS 8/28

## 2018-02-09 NOTE — Progress Notes (Signed)
[facility]  [phone] [phone]  Clinic Follow Up Note   Patient Care Team: [name], MD as PCP - General (Family Medicine) [name], MD as Consulting Physician (General Surgery) [name], MD as Consulting Physician (Hematology)   Date of Service:  02/11/2018  CHIEF COMPLAINTS:  Follow up for Bilateral Breast Cancer   Oncology History   Cancer Staging Cancer of overlapping sites of right female breast [facility]) Staging form: Breast, AJCC 8th Edition - Clinical stage from 09/23/2017: Stage IIIA (cT3, cN1, cM0, G2, ER+, PR-, HER2-) - Signed by [name], MD on 10/09/2017 - Clinical: cT3 - Unsigned       Cancer of overlapping sites of right female breast ([place])   09/09/2017 Mammogram    IMPRESSION: 1. Highly suspicious large right breast mass involving the outer and upper breast extending from approximately 9 o'clock through 12-1 o'clock, maximum measurement approximating 7.5 cm. The mass involves the right nipple, accounting for nipple retraction. Architectural distortion, microcalcifications and diffuse trabecular thickening and skin thickening are associated with the large mass, raising the possibility of this representing an inflammatory cancer. 2. Satellite masses separate from the dominant mass involving the lower outer quadrant and the upper inner quadrant of the right breast, measured above. 3. Two adjacent pathologic right axillary lymph nodes. 4. Indeterminate 0.9 cm solid mass involving the upper outer quadrant of the left breast which accounts for a mammographic finding. 5. No pathologic left axillary lymphadenopathy.    09/09/2017 Breast US    Targeted right breast ultrasound is performed, showing a very large hypoechoic mass with irregular margins extending from the approximate 9 o'clock position through the 12 to 1 o'clock position. On the CC gait image, the mass measures maximally approximately 7.5 cm. The mass has  irregular margins, demonstrates acoustic shadowing, and demonstrates internal power Doppler flow. The mass extends into the retroareolar region and involves the nipple, accounting for the nipple retraction.  Separate from the dominant mass at the 7 o'clock position approximately 4 cm from nipple is a hypoechoic antiparallel mass with irregular margins measuring approximately 1.4 x 2.7 x 1.7 cm.  Separate from the dominant mass at the 1 o'clock position approximately 2 cm from nipple is a hypoechoic mass with irregular margins measuring approximately 1.7 x 0.5 x 1.6 cm. Both of these masses also demonstrate internal power Doppler flow.  Sonographic evaluation of the right axilla demonstrates 2 adjacent pathologic lymph nodes, the larger measuring approximately 2.5 x 2.5 x 2.8 cm, the smaller measuring approximately 2.0 x 0.8 x 2.7 cm.    09/23/2017 Initial Biopsy    Diagnosis 1. Breast, left, needle core biopsy, 2 o'clock - INVASIVE DUCTAL CARCINOMA. - SEE COMMENT. 2. Breast, right, needle core biopsy, 11:30 o'clock - INVASIVE MAMMARY CARCINOMA. - SEE COMMENT. 3. Lymph node, needle/core biopsy, right axilla - METASTATIC MAMMARY CARCINOMA IN 1 OF 1 LYMPH NODE (1/1). Microscopic Comment 1. There is a small focus of grade I invasive ductal carcinoma. A complete breast prognostic profile will be attempted and the results reported separately. The results were called to the [address] on 09/24/2017. 2. The carcinoma appears grade II. An E-Cadherin stain and a breast prognostic profile will be performed on part 2 and the results reported separately. (JBK:kh 09-24-17) [name]  1. HER2 Negative by FISH Estrogen receptor: 100% positive, strong staining Progesterone receptor: 80% positive, strong staining Ki67: 5%  2. HER2 Negative by FISH Estrogen receptor: 100% positive, strong staining Progesterone  receptor: 0%, negative  Ki67: 30% 2. The tumor cells are strongly positive  for E-cadherin, supporting a ductal phenotype    09/23/2017 Cancer Staging    Staging form: Breast, AJCC 8th Edition - Clinical stage from 09/23/2017: Stage IIIA (cT3, cN1, cM0, G2, ER+, PR-, HER2-) - Signed by Yvanna Vidas, MD on 10/09/2017    10/07/2017 Breast MRI    IMPRESSION: 1. Biopsy proven malignancy involving all 4 quadrants of the right breast, although predominantly located in the anterior third of the central upper right breast measures 6.8 x 9.6 x 7.2 cm.  2. Five pathologic right axillary lymph nodes, compatible with biopsy proven axillary metastases.  3. Small mass with associated biopsy related changes in the upper-outer left breast at site of known malignancy measures 0.9 x 0.8 x 0.7 cm. No abnormal lymph nodes seen in the left axilla.  RECOMMENDATION: Treatment plan for known bilateral breast malignancy.  BI-RADS CATEGORY  6: Known biopsy-proven malignancy.    10/08/2017 Initial Diagnosis    Cancer of overlapping sites of right female breast (HCC)    10/13/2017 Pathology Results    Diagnosis 1. Breast, right, needle core biopsy, 1 o'clock - INVASIVE DUCTAL CARCINOMA. - SEE COMMENT. 2. Breast, right, needle core biopsy, 7 o'clock - INVASIVE DUCTAL CARCINOMA. - SEE COMMENT. Microscopic Comment 1. and 2. The carcinoma in parts 1 and 2 is morphologically similar and appears grade II. Because breast prognostic profiles were performed on the patients previous case, [id number], it will not be repeated on the current case unless requested.     10/17/2017 Imaging    CT CAP IMPRESSION: 1. Infiltrative right breast mass with overlying skin thickening is identified compatible with known breast cancer. 2. Enlarged right axillary lymph nodes compatible with metastatic adenopathy. 3. No additional sites of disease identified within the chest and no evidence for metastasis to the abdomen or pelvis. 4. Aortic atherosclerosis and 3 vessel coronary artery atherosclerotic  calcifications. Aortic Atherosclerosis (ICD10-I70.0).    10/17/2017 Imaging    Bone Scan FINDINGS: There are no foci of increased or decreased radiotracer uptake to suggest osseous metastatic disease. 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 There is degenerative type uptake within both shoulders and the left ankle.  Normal physiologic activity is identified within the kidneys and urinary bladder.  IMPRESSION: No evidence of osseous metastatic disease.    10/22/2017 -  Chemotherapy    AC q2 weeks x4 cycles starting 10/22/17-12/03/17, followed by weekly taxol x12 starting 12/17/17    11/14/2017 Genetic Testing    The genes that were analyzed were the 83 genes on [facility]'s Multi-Cancer panel (ALK, APC, ATM, AXIN2, BAP1, BARD1, BLM, BMPR1A, BRCA1, BRCA2, BRIP1, CASR, CDC73, CDH1, CDK4, CDKN1B, CDKN1C, CDKN2A, CEBPA, CHEK2, CTNNA1, DICER1, DIS3L2, EGFR, EPCAM, FH, FLCN, GATA2, GPC3, GREM1, HOXB13, HRAS, KIT, MAX, MEN1, MET, MITF, MLH1, MSH2, MSH3, MSH6, MUTYH, NBN, NF1, NF2, NTHL1, PALB2, PDGFRA, PHOX2B, PMS2, POLD1, POLE, POT1, PRKAR1A, PTCH1, PTEN, RAD50, RAD51C, RAD51D, RB1, RECQL4, RET, RUNX1, SDHA, SDHAF2, SDHB, SDHC, SDHD, SMAD4, SMARCA4, SMARCB1, SMARCE1, STK11, SUFU, TERC, TERT, TMEM127, TP53, TSC1, TSC2, VHL, WRN, WT1).  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    12/16/2017 Breast US    IMPRESSION: 1. Stable to minimal decrease in size of multiple irregular masses throughout the right breast consistent with the patient's sites of   biopsy proven malignancy. While the mammographic appearance is slightly less dense in this region, there is increased skin and trabecular thickening involving the entire right breast. 2. Stable appearance of morphologically abnormal  right axillary lymph nodes, consistent with biopsy proven metastatic disease. 3. Stable appearance of a left breast mass, consistent with biopsy proven malignancy.      Malignant neoplasm of left breast in female, estrogen receptor positive (HCC)   09/09/2017 Breast US    Targeted left breast ultrasound is performed, showing an oval parallel hypoechoic mass containing microcalcifications at the 2 o'clock position approximately 6 cm from nipple measuring approximately 0.9 x 0.5 x 0.9 cm, corresponding to the mammographic finding.  Sonographic evaluation of the left axilla demonstrates no pathologic lymphadenopathy.    09/23/2017 Initial Biopsy    Diagnosis 1. Breast, left, needle core biopsy, 2 o'clock - INVASIVE DUCTAL CARCINOMA. - SEE COMMENT. 2. Breast, right, needle core biopsy, 11:30 o'clock - INVASIVE MAMMARY CARCINOMA. - SEE COMMENT. 3. Lymph node, needle/core biopsy, right axilla - METASTATIC MAMMARY CARCINOMA IN 1 OF 1 LYMPH NODE (1/1). Microscopic Comment 1. There is a small focus of grade I invasive ductal carcinoma. A complete breast prognostic profile will be attempted and the results reported separately. [facility]  on 09/24/2017. 2. The carcinoma appears grade II. An E-Cadherin stain and a breast prognostic profile will be performed on part 2 and the results reported separately. (JBK:kh 09-24-17) [name]  1. HER2 Negative by FISH Estrogen receptor: 100% positive, strong staining Progesterone receptor: 80% positive, strong staining Ki67: 5%  2. HER2 Negative by FISH Estrogen receptor: 100% positive, strong staining Progesterone receptor: 0%, negative  Ki67: 30%    10/08/2017 Initial Diagnosis    Malignant neoplasm of left breast in female, estrogen receptor positive (HCC)    12/16/2017 Breast US    IMPRESSION: 3. Stable appearance of a left breast mass, consistent with biopsy proven malignancy.     01/22/2018 Imaging     01/22/2018 MRI Bilateral Breast IMPRESSION: 1. Dominant enhancing mass with surrounding nodularity involving the central anterior third of the right breast now measures up to 6.4 x 6.7 x 7.6 cm (previously measured 6.8 x 9.6 x 7.2 cm). Persistent bulky right axillary lymphadenopathy, slightly decreased in size since prior MRI.  2. Biopsy proven left breast malignancy now measures 0.5 cm (previously measured 0.9 x 0.8 x 0.7 cm).      HISTORY OF PRESENTING ILLNESS:   Darlene Beasley 61 y.o. female is here because of newly diagnosed bilateral breast cancer. In 03/2017 she "pinched" herself at work and developed a right breast bruise and soreness which finally resolved in 04/2017. In January 2019 she noticed right nipple inversion. She researched online and made plans to have a mammogram, last done 30 years ago. She had a palpable breast 'lump" which she felt after her son was born 40 years ago that was never biopsied and eventually resolved. She reports intentional weight loss.   Imaging revealed a large right breast mass involving the upper outer quadrant extending from 9 o'clock to 12-1 o'clock, measuring approximately 7.5 cm, involving the right nipple. There is a satellite lesion at the 7 o'clock position approximately 4 cm from the nipple with irregular margins measuring 1.4 x 2.7, x 1.7 cm. Additional satellite lesion at 1 o'clock position approximately 2 cm from the nipple measures 1.7 x 0.5 x 1.6 cm. US of right axilla reveals 2 adjacent pathologic lymph nodes, measuring   2.5 x 2.5 x 2.8 cm and 2.0 x 0.8 x 2.7 cm. Biopsy of the right breast revealed invasive mammary carcinoma, tumor cells are strongly positive for E-cadherin, supporting a ductal phenotype; grade II, HER2 negative, ER positive, PR negative, metastatic to right axilla. Targeted left breast US reveals hypoechoic mass at the 2 o'clock position approximately 6 cm from the nipple measuring 0.9 x 0.5 x 0.9 fm. No suspicious  adenopathy in the left axilla. Biopsy of the left breast revealed invasive ductal carcinoma, grade I, HER2 negative, ER/PR positive. Breast MRI revealed biopsy-proven malignancy involving all 4 quadrants of the right breast, predominantly in the anterior third of the central upper right breast measuring 6.8 x 9.6 x 7.2 cm with 5 pathologic right axillary lymph nodes, compatible with biopsy-proven axillary metastases. In the left breast there is a small mass with associated biopsy related changes in the upper outer left breast at site of known malignancy, no abnormal axillary lymph nodes.   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 Had GERD in the past. She has smoked 1-2 PPD cigarettes x51 years, currently weaning. She quit drinking alcohol when she changed her diet and began losing weight in 08/2017. She has a significant other with whom she resides. She is independent of all ADLs. She is adopted, family history unknown.  Today she feels well, she has lingering cough from recent URI. No fever or chills. She is fatigued at baseline due to busy job, she works 14-hour days, 60 hours per week, working at [employer] in [place]. She tolerated breast biopsy well with mild bruising and swelling.   GYN HISTORY  Menarchal: age 35 LMP: age 50-48 Contraceptive: age [age] OCP HRT: None G1P1   CURRENT THERAPY : Neoadjuvant chemo AC q2 weeks x4 cycles starting 10/22/17-12/03/17, followed by weekly taxol x12 starting 12/17/17  INTERVAL HISTORY   Darlene Beasley is here for a follow up. She is here alone at the clinic. She saw Dr. Dalbert Batman last week and he told her that she will need mastectomy. She will see her plastic surgeon [name] soon. Pt states that her neuropathy is mild and improved with activity. She denies dropping things and states that she uses ice for her hands and feet. Her appetite is good and  improving.  She states that she quit smoking last night and will encourage her partner to quit too.      MEDICAL HISTORY:  Past Medical History:  Diagnosis Date  . Adopted   . Arthritis   . breast ca dx'd 08/2017  . Genetic testing 11/14/2017   Multi-Cancer panel (83 genes) @ [facility] - Pathogenic mutation in the MITF gene  . GERD (gastroesophageal reflux disease)   . 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 IR IMAGING GUIDED PORT INSERTION  10/20/2017  . IR US GUIDE VASC ACCESS LEFT  10/20/2017  . TONSILLECTOMY Bilateral   . TUBAL LIGATION      SOCIAL HISTORY: Social History   Socioeconomic History  . Marital status: Significant Other    Spouse name: Not on file  . Number of children: 1  . Years of education: Not on file  . Highest education level: Not on file  Occupational History  . Not on file  Social Needs  . Financial  resource strain: Not on file  . Food insecurity:    Worry: Not on file    Inability: Not on file  . Transportation needs:    Medical: Not on file    Non-medical: Not on file  Tobacco Use  . Smoking status: Current Every Day Smoker    Packs/day: 0.50    Years: 51.00    Pack years: 25.50    Types: Cigarettes  . Smokeless tobacco: Never Used  . Tobacco comment: Working on quitting.  Substance and Sexual Activity  . Alcohol use: No    Comment: stopped 08/2017   . Drug use: Yes    Types: Marijuana    Comment: daily, since age 71   . Sexual activity: Not Currently  Lifestyle  . Physical activity:    Days per week: 7 days    Minutes per session: 80 min  . Stress: Very much  Relationships  . Social connections:    Talks on phone: Never    Gets together: Never    Attends religious service: Never    Active member of club or organization: No    Attends meetings of clubs or organizations: Never     Relationship status: Living with partner  . Intimate partner violence:    Fear of current or ex partner: No    Emotionally abused: No    Physically abused: No    Forced sexual activity: No  Other Topics Concern  . Not on file  Social History Narrative  . Not on file    FAMILY HISTORY: Family History  Adopted: Yes  Problem Relation Age of Onset  . Congestive Heart Failure Mother   . Congestive Heart Failure Father     ALLERGIES:  is allergic to bee venom.  MEDICATIONS:  Current Outpatient Medications  Medication Sig Dispense Refill  . Coenzyme Q10 100 MG capsule Take 100 mg by mouth daily.     [name] lidocaine-prilocaine (EMLA) cream Apply to affected area once 30 g 3  . ondansetron (ZOFRAN) 8 MG tablet Take 1 tablet (8 mg total) by mouth 2 (two) times daily as needed. Start on the third day after chemotherapy. 30 tablet 1  . prochlorperazine (COMPAZINE) 10 MG tablet Take 1 tablet (10 mg total) by mouth every 6 (six) hours as needed (Nausea or vomiting). 60 tablet 1  . vitamin B-12 (CYANOCOBALAMIN) 1000 MCG tablet Take 1,000 mcg by mouth daily.     No current facility-administered medications for this visit.     REVIEW OF SYSTEMS:   Constitutional: Denies fevers, chills or abnormal night sweats   Eyes: Denies blurriness of vision, double vision or watery eyes Ears, nose, mouth, throat, and face: Denies mucositis or sore throat Respiratory: Denies cough, dyspnea or wheezes Cardiovascular: Denies palpitation, chest discomfort or lower extremity swelling Gastrointestinal:  Denies nausea, heartburn   Skin: Denies abnormal skin rashes   Breast: (+) Reduced right nipple inversion and softer mass Lymphatics: Denies new lymphadenopathy or easy bruising Neurological:Denies new weaknesses (+) very mild and tolerable neuropathy  Behavioral/Psych: Mood is stable, no new changes  All other systems were reviewed with the patient and are negative.  PHYSICAL EXAMINATION:  ECOG PERFORMANCE  STATUS: 0 - Asymptomatic  Vitals:   02/11/18 1315  BP: (!) 162/83  Pulse: 78  Resp: 17  Temp: 98.2 F (36.8 C)  SpO2: 99%   Filed Weights   02/11/18 1315  Weight: 168 lb 9.6 oz (76.5 kg)    GENERAL:alert, no distress and comfortable (+)  hair loss SKIN: skin color, texture, turgor are normal, no rashes or significant lesions EYES: normal, conjunctiva are pink and non-injected, sclera clear OROPHARYNX:no exudate, no erythema and lips, buccal mucosa, and tongue normal  NECK: supple, thyroid normal size, non-tender, without nodularity LYMPH:  no palpable lymphadenopathy in the cervical, axillary or inguinal LUNGS: clear to auscultation and percussion with normal breathing effort HEART: regular rate & rhythm and no murmurs and no lower extremity edema ABDOMEN:abdomen soft, non-tender and normal bowel sounds Musculoskeletal:no cyanosis of digits and no clubbing  PSYCH: alert & oriented x 3 with fluent speech NEURO: no focal motor/sensory deficits BREAST: Skin hyperpigmentation of right nipple, 7x5 cm mass in center of right breast, now softer. She previously had a 1 x 1.5 cm mobile nodule to her right axilla, not palpable anymore. No palpable mass in left breast. Mild skin edema. No ulcers, bleeding, or discharge.    LABORATORY DATA:  I have reviewed the data as listed CBC Latest Ref Rng & Units 02/11/2018 02/04/2018 01/28/2018  WBC 3.9 - 10.3 K/uL 5.0 4.5 4.4  Hemoglobin 11.6 - 15.9 g/dL 11.9 12.1 11.8  Hematocrit 34.8 - 46.6 % 35.2 36.1 34.0(L)  Platelets 145 - 400 K/uL 214 215 194    CMP Latest Ref Rng & Units 02/04/2018 01/28/2018 01/21/2018  Glucose 70 - 99 mg/dL 109(H) 112(H) 91  BUN 8 - 23 mg/dL _0 Creatinine 0.44 - 1.00 mg/dL 0.76 0.70 0.70  Sodium 135 - 145 mmol/L 144 142 142  Potassium 3.5 - 5.1 mmol/L 3.7 4.0 3.9  Chloride 98 - 111 mmol/L 109 108 108  CO2 22 - 32 mmol/L _1 Calcium 8.9 - 10.3 mg/dL 9.1 8.6(L) 8.8(L)  Total Protein 6.5 - 8.1 g/dL 6.1(L)  6.0(L) 6.0(L)  Total Bilirubin 0.3 - 1.2 mg/dL 0.4 0.3 0.3  Alkaline Phos 38 - 126 U/L 56 60 55  AST 15 - 41 U/L 18 16 14(L)  ALT 0 - 44 U/L _2 Tumor Markers CA 27.29 Results for BLAZE, SANDIN [name] "[name]" (MRN [id number]) as of 01/24/2018 14:27  Ref. Range 11/06/2017 09:45 12/03/2017 09:35 12/31/2017 11:44 01/21/2018 12:22  CA 27.29 Latest Ref Range: 0.0 - 38.6 U/mL 53.4 (H) 61.4 (H) 43.9 (H) 23.1    PATHOLOGY  11/07/2017 Molecular Pathology   Diagnosis 10/13/17 1. Breast, right, needle core biopsy, 1 o'clock - INVASIVE DUCTAL CARCINOMA. - SEE COMMENT. 2. Breast, right, needle core biopsy, 7 o'clock - INVASIVE DUCTAL CARCINOMA. - SEE COMMENT. Microscopic Comment 1. and 2. The carcinoma in parts 1 and 2 is morphologically similar and appears grade II. Because breast prognostic profiles were performed on the patients previous case, [id number], it will not be repeated on the current case unless requested.   Diagnosis 09/23/17 1. Breast, left, needle core biopsy, 2 o'clock - INVASIVE DUCTAL CARCINOMA. - SEE COMMENT. 2. Breast, right, needle core biopsy, 11:30 o'clock - INVASIVE MAMMARY CARCINOMA. - SEE COMMENT. 3. Lymph node, needle/core biopsy, right axilla - METASTATIC MAMMARY CARCINOMA IN 1 OF 1 LYMPH NODE (1/1). Microscopic Comment 1. There is a small focus of grade I invasive ductal carcinoma. A complete breast prognostic profile will be attempted and the results reported separately. The results were called to the [facility] on 09/24/2017. 2. The carcinoma appears grade II. An E-Cadherin stain and a breast prognostic profile will be performed on part 2 and the results reported separately. (JBK:kh 09-24-17) [name] 1. HER2 Negative by [facility] Estrogen  receptor: 100% positive, strong staining Progesterone receptor: 80% positive, strong staining Ki67: 5% 2. HER2 Negative by FISH Estrogen receptor: 100% positive, strong staining Progesterone receptor: 0%,  negative  Ki67: 30% 2. The tumor cells are strongly positive for E-cadherin, supporting a ductal phenotype   RADIOGRAPHIC STUDIES: I have personally reviewed the radiological images as listed and agreed with the findings in the report.  01/22/2018 MRI Bilateral Breast IMPRESSION: 1. Dominant enhancing mass with surrounding nodularity involving the central anterior third of the right breast now measures up to 6.4 x 6.7 x 7.6 cm (previously measured 6.8 x 9.6 x 7.2 cm). Persistent bulky right axillary lymphadenopathy, slightly decreased in size since prior MRI.  2. Biopsy proven left breast malignancy now measures 0.5 cm (previously measured 0.9 x 0.8 x 0.7 cm).    ASSESSMENT & PLAN:  Darlene Beasley is a 62 y.o. Caucasian female with history of substance use presented with right nipple inversion for 3 months.    1. 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 She has locally advanced right breast cancer with a dominant 9.6 cm mass involving all 4 quadrants of the breast involving 5 pathologic axillary lymph nodes. She has a small left side breast cancer as well. She saw Dr. Dalbert Batman who discussed surgical options, including right mastectomy and possible left breast conserving surgery with subsequent radiation. She also met with Dr. Lisbeth Renshaw. I previously discussed the advantages of neoadjuvant chemotherapy and recommends AC x4 followed by weekly taxol x12 cycles. She is active and relatively healthy, she appears to be a good candidate for chemotherapy  -Her baseline echo from 10/15/17 shows grade 1  diastolic dysfunction, EF 76-73%, GLS -21% -She completed AC every 2 weeks 10/22/17-12/03/17. She tolerated well with mild bone pain from neulasta, mucositosis and intermittent fatigue.  -Her clinical breast exam and 12/16/17 interim mammo and US reveal stable to minimal decrease in size of multiple irregular masses in the right breast, stable right axillary lymph nodes, and stable left breast mass. Her disease does not appear to be very sensitive to chemotherapy. -She started weekly taxol on 12/17/17 and is tolerating very well with no significant side effects. She previously experienced sweating during infusion. Will monitor.  -Her repeated breast MRI on January 22, 2018 showed slightly decreased size of her right breast tumor.  Her tumor marker has came down to normal also, she is clinically tolerating chemotherapy very well, I recommend her to continue and complete as planned. -She saw Dr. Dalbert Batman recently. She likely need a right mastectomy and axillary lymph node dissection, and she plans to have breast reconstruction.  I have referred her to plastic surgeon [name] and her appointment is later this week.  She has quit smoking completely in order to get the reconstruction surgery. -Labs reviewed and adequate to proceed with Taxol today.  -She is expected to go for surgery in October, and will be set for radiation after that.  -Lab reviewed, adequate for treatment, will proceed to cycle 8 taxol today and continue weekly  -f/u in 2 weeks with lacie -f/u with me on 03/12/2018  2. Social support -She works 64  hours per week at her physically demanding job. I informed her she may need to take time off during her intensive chemotherapy schedule. She plans to apply for medicaid. I previously referred her to social work for ongoing f/u.  -She has met with our financial office, she is working on Engineer, mining.   3. Substance use -She has been smoking cigarettes and marijuana for decades. I encouraged  her to quit completely.  -She mentioned interest in herbal and holistic diet, I previously recommended that she avoid herbal supplements while on chemotherapy and endocrine therapy due to potential drug interactions, she understands.  -She now smokes 3 cigarettes a day. I previously offered her the nicotine patch and chantix to which the patient declines this at this time.  -I again encouraged her to quit completely  -She quit smoking last night and said that she will encourage her partner to quit too.   4. Insomnia -She works long hours and has interrupted sleep.  -I previously recommended benadryl and or melatonin OTC first. She agreed.   5.  Hypertension  -Her BP has been elevated during her visits. She has not checked at home. I advise her to reduce salt intake, increase water intake and if BP high in future I will start her on hypertension medication.  -I previously discussed with the patient to continue with decreasing sodium intake as well as continue with exercise to aid with elevated blood pressure readings.  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  I encouraged her to use ice bag for her hands and feet during chemo infusion.  7. 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  -I encouraged her to avoid too much direct sunlight and use stronger SPF Suncreen given her MITF gene mutation.    PLAN: -Lab reviewed, adequate for treatment, will proceed to cycle 8 taxol today and continue weekly for 4 more weeks  -f/u in 2 weeks with lacie -f/u with me on 03/12/2018 -She will see plastic surgeon Dr.  Iran Planas this Friday     All questions were answered. The patient knows to call the clinic with any problems, questions or concerns.  I spent 15 minutes counseling the patient face to face. The total time spent in the appointment was 20 minutes and more than 50% was on counseling.  Dierdre Searles Dweik am acting as scribe for Dr. [name].  I have reviewed the above documentation for accuracy and completeness, and I agree with the above.      [name], MD 02/11/2018

## 2018-02-10 ENCOUNTER — Telehealth: Payer: Self-pay | Admitting: Hematology

## 2018-02-10 NOTE — Telephone Encounter (Signed)
Faxed records to Dr. Para Skeans office.

## 2018-02-11 ENCOUNTER — Encounter: Payer: Self-pay | Admitting: Hematology

## 2018-02-11 ENCOUNTER — Inpatient Hospital Stay: Payer: Medicaid Other

## 2018-02-11 ENCOUNTER — Inpatient Hospital Stay (HOSPITAL_BASED_OUTPATIENT_CLINIC_OR_DEPARTMENT_OTHER): Payer: Medicaid Other | Admitting: Hematology

## 2018-02-11 ENCOUNTER — Inpatient Hospital Stay: Payer: Medicaid Other | Attending: Nurse Practitioner

## 2018-02-11 VITALS — BP 162/83 | HR 78 | Temp 98.2°F | Resp 17 | Ht 64.0 in | Wt 168.6 lb

## 2018-02-11 DIAGNOSIS — Z5111 Encounter for antineoplastic chemotherapy: Secondary | ICD-10-CM | POA: Diagnosis present

## 2018-02-11 DIAGNOSIS — C773 Secondary and unspecified malignant neoplasm of axilla and upper limb lymph nodes: Secondary | ICD-10-CM | POA: Diagnosis not present

## 2018-02-11 DIAGNOSIS — G629 Polyneuropathy, unspecified: Secondary | ICD-10-CM | POA: Diagnosis not present

## 2018-02-11 DIAGNOSIS — I429 Cardiomyopathy, unspecified: Secondary | ICD-10-CM

## 2018-02-11 DIAGNOSIS — I10 Essential (primary) hypertension: Secondary | ICD-10-CM | POA: Insufficient documentation

## 2018-02-11 DIAGNOSIS — Z170 Estrogen receptor positive status [ER+]: Secondary | ICD-10-CM | POA: Insufficient documentation

## 2018-02-11 DIAGNOSIS — Z79899 Other long term (current) drug therapy: Secondary | ICD-10-CM | POA: Diagnosis not present

## 2018-02-11 DIAGNOSIS — C50811 Malignant neoplasm of overlapping sites of right female breast: Secondary | ICD-10-CM

## 2018-02-11 DIAGNOSIS — C50412 Malignant neoplasm of upper-outer quadrant of left female breast: Secondary | ICD-10-CM | POA: Diagnosis not present

## 2018-02-11 DIAGNOSIS — F17210 Nicotine dependence, cigarettes, uncomplicated: Secondary | ICD-10-CM

## 2018-02-11 DIAGNOSIS — Z9221 Personal history of antineoplastic chemotherapy: Secondary | ICD-10-CM | POA: Diagnosis not present

## 2018-02-11 DIAGNOSIS — G4700 Insomnia, unspecified: Secondary | ICD-10-CM | POA: Insufficient documentation

## 2018-02-11 DIAGNOSIS — Z95828 Presence of other vascular implants and grafts: Secondary | ICD-10-CM

## 2018-02-11 LAB — CBC WITH DIFFERENTIAL (CANCER CENTER ONLY)
BASOS PCT: 1 %
Basophils Absolute: 0 10*3/uL (ref 0.0–0.1)
EOS ABS: 0.1 10*3/uL (ref 0.0–0.5)
EOS PCT: 2 %
HCT: 35.2 % (ref 34.8–46.6)
HEMOGLOBIN: 11.9 g/dL (ref 11.6–15.9)
Lymphocytes Relative: 23 %
Lymphs Abs: 1.2 10*3/uL (ref 0.9–3.3)
MCH: 34 pg (ref 25.1–34.0)
MCHC: 33.7 g/dL (ref 31.5–36.0)
MCV: 100.8 fL (ref 79.5–101.0)
MONOS PCT: 5 %
Monocytes Absolute: 0.3 10*3/uL (ref 0.1–0.9)
NEUTROS PCT: 69 %
Neutro Abs: 3.5 10*3/uL (ref 1.5–6.5)
PLATELETS: 214 10*3/uL (ref 145–400)
RBC: 3.49 MIL/uL — AB (ref 3.70–5.45)
RDW: 15.2 % — ABNORMAL HIGH (ref 11.2–14.5)
WBC Count: 5 10*3/uL (ref 3.9–10.3)

## 2018-02-11 LAB — CMP (CANCER CENTER ONLY)
ALK PHOS: 51 U/L (ref 38–126)
ALT: 17 U/L (ref 0–44)
AST: 14 U/L — ABNORMAL LOW (ref 15–41)
Albumin: 3.7 g/dL (ref 3.5–5.0)
Anion gap: 7 (ref 5–15)
BUN: 25 mg/dL — ABNORMAL HIGH (ref 8–23)
CALCIUM: 9.1 mg/dL (ref 8.9–10.3)
CO2: 26 mmol/L (ref 22–32)
CREATININE: 0.7 mg/dL (ref 0.44–1.00)
Chloride: 109 mmol/L (ref 98–111)
Glucose, Bld: 99 mg/dL (ref 70–99)
Potassium: 4 mmol/L (ref 3.5–5.1)
SODIUM: 142 mmol/L (ref 135–145)
Total Bilirubin: 0.2 mg/dL — ABNORMAL LOW (ref 0.3–1.2)
Total Protein: 6 g/dL — ABNORMAL LOW (ref 6.5–8.1)

## 2018-02-11 MED ORDER — SODIUM CHLORIDE 0.9 % IV SOLN
80.0000 mg/m2 | Freq: Once | INTRAVENOUS | Status: AC
  Administered 2018-02-11: 150 mg via INTRAVENOUS
  Filled 2018-02-11: qty 25

## 2018-02-11 MED ORDER — FAMOTIDINE IN NACL 20-0.9 MG/50ML-% IV SOLN
20.0000 mg | Freq: Once | INTRAVENOUS | Status: AC
  Administered 2018-02-11: 20 mg via INTRAVENOUS

## 2018-02-11 MED ORDER — DIPHENHYDRAMINE HCL 50 MG/ML IJ SOLN
50.0000 mg | Freq: Once | INTRAMUSCULAR | Status: AC
  Administered 2018-02-11: 50 mg via INTRAVENOUS

## 2018-02-11 MED ORDER — SODIUM CHLORIDE 0.9% FLUSH
10.0000 mL | Freq: Once | INTRAVENOUS | Status: AC
  Administered 2018-02-11: 10 mL
  Filled 2018-02-11: qty 10

## 2018-02-11 MED ORDER — SODIUM CHLORIDE 0.9 % IV SOLN
Freq: Once | INTRAVENOUS | Status: AC
  Administered 2018-02-11: 14:00:00 via INTRAVENOUS
  Filled 2018-02-11: qty 250

## 2018-02-11 MED ORDER — HEPARIN SOD (PORK) LOCK FLUSH 100 UNIT/ML IV SOLN
500.0000 [IU] | Freq: Once | INTRAVENOUS | Status: AC | PRN
  Administered 2018-02-11: 500 [IU]
  Filled 2018-02-11: qty 5

## 2018-02-11 MED ORDER — FAMOTIDINE IN NACL 20-0.9 MG/50ML-% IV SOLN
INTRAVENOUS | Status: AC
  Filled 2018-02-11: qty 50

## 2018-02-11 MED ORDER — SODIUM CHLORIDE 0.9 % IV SOLN
20.0000 mg | Freq: Once | INTRAVENOUS | Status: AC
  Administered 2018-02-11: 20 mg via INTRAVENOUS
  Filled 2018-02-11: qty 2

## 2018-02-11 MED ORDER — SODIUM CHLORIDE 0.9% FLUSH
10.0000 mL | INTRAVENOUS | Status: DC | PRN
  Administered 2018-02-11: 10 mL
  Filled 2018-02-11: qty 10

## 2018-02-11 MED ORDER — DIPHENHYDRAMINE HCL 50 MG/ML IJ SOLN
INTRAMUSCULAR | Status: AC
  Filled 2018-02-11: qty 1

## 2018-02-11 NOTE — Patient Instructions (Signed)
[facility] Discharge Instructions for Patients Receiving Chemotherapy  Today you received the following chemotherapy agents paclitaxel (Taxol) To help prevent nausea and vomiting after your treatment, we encourage you to take your nausea medication as directed   If you develop nausea and vomiting that is not controlled by your nausea medication, call the clinic.   BELOW ARE SYMPTOMS THAT SHOULD BE REPORTED IMMEDIATELY:  *FEVER GREATER THAN 100.5 F  *CHILLS WITH OR WITHOUT FEVER  NAUSEA AND VOMITING THAT IS NOT CONTROLLED WITH YOUR NAUSEA MEDICATION  *UNUSUAL SHORTNESS OF BREATH  *UNUSUAL BRUISING OR BLEEDING  TENDERNESS IN MOUTH AND THROAT WITH OR WITHOUT PRESENCE OF ULCERS  *URINARY PROBLEMS  *BOWEL PROBLEMS  UNUSUAL RASH Items with * indicate a potential emergency and should be followed up as soon as possible.  Feel free to call the clinic should you have any questions or concerns. The clinic phone number is [phone].  Please show the CHEMO ALERT CARD at check-in to the Emergency Department and triage nurse.   

## 2018-02-12 ENCOUNTER — Telehealth: Payer: Self-pay | Admitting: Hematology

## 2018-02-12 NOTE — Telephone Encounter (Signed)
No los 9/5 °

## 2018-02-18 ENCOUNTER — Inpatient Hospital Stay: Payer: Medicaid Other

## 2018-02-18 ENCOUNTER — Other Ambulatory Visit: Payer: Self-pay

## 2018-02-18 VITALS — BP 164/96 | HR 74 | Temp 97.8°F | Resp 18

## 2018-02-18 DIAGNOSIS — C50811 Malignant neoplasm of overlapping sites of right female breast: Secondary | ICD-10-CM

## 2018-02-18 DIAGNOSIS — Z170 Estrogen receptor positive status [ER+]: Principal | ICD-10-CM

## 2018-02-18 DIAGNOSIS — Z95828 Presence of other vascular implants and grafts: Secondary | ICD-10-CM

## 2018-02-18 LAB — CMP (CANCER CENTER ONLY)
ALT: 17 U/L (ref 0–44)
AST: 24 U/L (ref 15–41)
Albumin: 3.6 g/dL (ref 3.5–5.0)
Alkaline Phosphatase: 49 U/L (ref 38–126)
Anion gap: 9 (ref 5–15)
BUN: 21 mg/dL (ref 8–23)
CO2: 23 mmol/L (ref 22–32)
Calcium: 8.8 mg/dL — ABNORMAL LOW (ref 8.9–10.3)
Chloride: 110 mmol/L (ref 98–111)
Creatinine: 0.73 mg/dL (ref 0.44–1.00)
GFR, Est AFR Am: >60 mL/min (ref >60)
GFR, Estimated: >60 mL/min (ref >60)
Glucose, Bld: 103 mg/dL — ABNORMAL HIGH (ref 70–99)
Potassium: 4.3 mmol/L (ref 3.5–5.1)
Sodium: 142 mmol/L (ref 135–145)
Total Bilirubin: 0.4 mg/dL (ref 0.3–1.2)
Total Protein: 6 g/dL — ABNORMAL LOW (ref 6.5–8.1)

## 2018-02-18 LAB — CBC WITH DIFFERENTIAL (CANCER CENTER ONLY)
Basophils Absolute: 0 10*3/uL (ref 0.0–0.1)
Basophils Relative: 1 %
Eosinophils Absolute: 0.1 10*3/uL (ref 0.0–0.5)
Eosinophils Relative: 2 %
HCT: 36.5 % (ref 34.8–46.6)
Hemoglobin: 12.2 g/dL (ref 11.6–15.9)
Lymphocytes Relative: 18 %
Lymphs Abs: 1 10*3/uL (ref 0.9–3.3)
MCH: 33.8 pg (ref 25.1–34.0)
MCHC: 33.6 g/dL (ref 31.5–36.0)
MCV: 100.7 fL (ref 79.5–101.0)
Monocytes Absolute: 0.3 10*3/uL (ref 0.1–0.9)
Monocytes Relative: 5 %
Neutro Abs: 4.1 10*3/uL (ref 1.5–6.5)
Neutrophils Relative %: 74 %
Platelet Count: 195 10*3/uL (ref 145–400)
RBC: 3.62 MIL/uL — ABNORMAL LOW (ref 3.70–5.45)
RDW: 14.5 % (ref 11.2–14.5)
WBC Count: 5.6 10*3/uL (ref 3.9–10.3)

## 2018-02-18 MED ORDER — HEPARIN SOD (PORK) LOCK FLUSH 100 UNIT/ML IV SOLN
500.0000 [IU] | Freq: Once | INTRAVENOUS | Status: AC | PRN
  Administered 2018-02-18: 500 [IU]
  Filled 2018-02-18: qty 5

## 2018-02-18 MED ORDER — FAMOTIDINE IN NACL 20-0.9 MG/50ML-% IV SOLN
INTRAVENOUS | Status: AC
  Filled 2018-02-18: qty 50

## 2018-02-18 MED ORDER — SODIUM CHLORIDE 0.9 % IV SOLN
Freq: Once | INTRAVENOUS | Status: AC
  Administered 2018-02-18: 09:00:00 via INTRAVENOUS
  Filled 2018-02-18: qty 250

## 2018-02-18 MED ORDER — DIPHENHYDRAMINE HCL 50 MG/ML IJ SOLN
50.0000 mg | Freq: Once | INTRAMUSCULAR | Status: AC
  Administered 2018-02-18: 50 mg via INTRAVENOUS

## 2018-02-18 MED ORDER — SODIUM CHLORIDE 0.9 % IV SOLN
20.0000 mg | Freq: Once | INTRAVENOUS | Status: AC
  Administered 2018-02-18: 20 mg via INTRAVENOUS
  Filled 2018-02-18: qty 2

## 2018-02-18 MED ORDER — FAMOTIDINE IN NACL 20-0.9 MG/50ML-% IV SOLN
20.0000 mg | Freq: Once | INTRAVENOUS | Status: AC
  Administered 2018-02-18: 20 mg via INTRAVENOUS

## 2018-02-18 MED ORDER — SODIUM CHLORIDE 0.9% FLUSH
10.0000 mL | Freq: Once | INTRAVENOUS | Status: AC
  Administered 2018-02-18: 10 mL
  Filled 2018-02-18: qty 10

## 2018-02-18 MED ORDER — SODIUM CHLORIDE 0.9% FLUSH
10.0000 mL | INTRAVENOUS | Status: DC | PRN
  Administered 2018-02-18: 10 mL
  Filled 2018-02-18: qty 10

## 2018-02-18 MED ORDER — DIPHENHYDRAMINE HCL 50 MG/ML IJ SOLN
INTRAMUSCULAR | Status: AC
  Filled 2018-02-18: qty 1

## 2018-02-18 MED ORDER — SODIUM CHLORIDE 0.9 % IV SOLN
80.0000 mg/m2 | Freq: Once | INTRAVENOUS | Status: AC
  Administered 2018-02-18: 150 mg via INTRAVENOUS
  Filled 2018-02-18: qty 25

## 2018-02-18 NOTE — Patient Instructions (Signed)
[facility] Discharge Instructions for Patients Receiving Chemotherapy  Today you received the following chemotherapy agents paclitaxel (Taxol) To help prevent nausea and vomiting after your treatment, we encourage you to take your nausea medication as directed   If you develop nausea and vomiting that is not controlled by your nausea medication, call the clinic.   BELOW ARE SYMPTOMS THAT SHOULD BE REPORTED IMMEDIATELY:  *FEVER GREATER THAN 100.5 F  *CHILLS WITH OR WITHOUT FEVER  NAUSEA AND VOMITING THAT IS NOT CONTROLLED WITH YOUR NAUSEA MEDICATION  *UNUSUAL SHORTNESS OF BREATH  *UNUSUAL BRUISING OR BLEEDING  TENDERNESS IN MOUTH AND THROAT WITH OR WITHOUT PRESENCE OF ULCERS  *URINARY PROBLEMS  *BOWEL PROBLEMS  UNUSUAL RASH Items with * indicate a potential emergency and should be followed up as soon as possible.  Feel free to call the clinic should you have any questions or concerns. The clinic phone number is [phone].  Please show the CHEMO ALERT CARD at check-in to the Emergency Department and triage nurse.   

## 2018-02-18 NOTE — Patient Instructions (Signed)
Implanted Port Home Guide An implanted port is a type of central line that is placed under the skin. Central lines are used to provide IV access when treatment or nutrition needs to be given through a person's veins. Implanted ports are used for long-term IV access. An implanted port may be placed because:  You need IV medicine that would be irritating to the small veins in your hands or arms.  You need long-term IV medicines, such as antibiotics.  You need IV nutrition for a long period.  You need frequent blood draws for lab tests.  You need dialysis.  Implanted ports are usually placed in the chest area, but they can also be placed in the upper arm, the abdomen, or the leg. An implanted port has two main parts:  Reservoir. The reservoir is round and will appear as a small, raised area under your skin. The reservoir is the part where a needle is inserted to give medicines or draw blood.  Catheter. The catheter is a thin, flexible tube that extends from the reservoir. The catheter is placed into a large vein. Medicine that is inserted into the reservoir goes into the catheter and then into the vein.  How will I care for my incision site? Do not get the incision site wet. Bathe or shower as directed by your health care provider. How is my port accessed? Special steps must be taken to access the port:  Before the port is accessed, a numbing cream can be placed on the skin. This helps numb the skin over the port site.  Your health care provider uses a sterile technique to access the port. ? Your health care provider must put on a mask and sterile gloves. ? The skin over your port is cleaned carefully with an antiseptic and allowed to dry. ? The port is gently pinched between sterile gloves, and a needle is inserted into the port.  Only "non-coring" port needles should be used to access the port. Once the port is accessed, a blood return should be checked. This helps ensure that the port  is in the vein and is not clogged.  If your port needs to remain accessed for a constant infusion, a clear (transparent) bandage will be placed over the needle site. The bandage and needle will need to be changed every week, or as directed by your health care provider.  Keep the bandage covering the needle clean and dry. Do not get it wet. Follow your health care provider's instructions on how to take a shower or bath while the port is accessed.  If your port does not need to stay accessed, no bandage is needed over the port.  What is flushing? Flushing helps keep the port from getting clogged. Follow your health care provider's instructions on how and when to flush the port. Ports are usually flushed with saline solution or a medicine called heparin. The need for flushing will depend on how the port is used.  If the port is used for intermittent medicines or blood draws, the port will need to be flushed: ? After medicines have been given. ? After blood has been drawn. ? As part of routine maintenance.  If a constant infusion is running, the port may not need to be flushed.  How long will my port stay implanted? The port can stay in for as long as your health care provider thinks it is needed. When it is time for the port to come out, surgery will be   done to remove it. The procedure is similar to the one performed when the port was put in. When should I seek immediate medical care? When you have an implanted port, you should seek immediate medical care if:  You notice a bad smell coming from the incision site.  You have swelling, redness, or drainage at the incision site.  You have more swelling or pain at the port site or the surrounding area.  You have a fever that is not controlled with medicine.  This information is not intended to replace advice given to you by your health care provider. Make sure you discuss any questions you have with your health care provider. Document  Released: 05/27/2005 Document Revised: 11/02/2015 Document Reviewed: 02/01/2013 [employer] Interactive Patient Education  2017 [employer]  

## 2018-02-19 ENCOUNTER — Inpatient Hospital Stay: Payer: Medicaid Other

## 2018-02-20 ENCOUNTER — Telehealth: Payer: Self-pay | Admitting: Hematology

## 2018-02-20 NOTE — Telephone Encounter (Signed)
Spoke to pts husband regarding upcoming appts per 9/10 sch message

## 2018-02-23 NOTE — Progress Notes (Signed)
[place]  [phone] [phone]  Clinic Follow up Note   Patient Care Team: [name], MD as PCP - General (Family Medicine) [name], MD as Consulting Physician (General Surgery) [name], MD as Consulting Physician (Hematology) 02/25/2018  SUMMARY OF ONCOLOGIC HISTORY: Oncology History   Cancer Staging Cancer of overlapping sites of right female breast [facility]) Staging form: Breast, AJCC 8th Edition - Clinical stage from 09/23/2017: Stage IIIA (cT3, cN1, cM0, G2, ER+, PR-, HER2-) - Signed by [name], MD on 10/09/2017 - Clinical: cT3 - Unsigned       Cancer of overlapping sites of right female breast ([place])   09/09/2017 Mammogram    IMPRESSION: 1. Highly suspicious large right breast mass involving the outer and upper breast extending from approximately 9 o'clock through 12-1 o'clock, maximum measurement approximating 7.5 cm. The mass involves the right nipple, accounting for nipple retraction. Architectural distortion, microcalcifications and diffuse trabecular thickening and skin thickening are associated with the large mass, raising the possibility of this representing an inflammatory cancer. 2. Satellite masses separate from the dominant mass involving the lower outer quadrant and the upper inner quadrant of the right breast, measured above. 3. Two adjacent pathologic right axillary lymph nodes. 4. Indeterminate 0.9 cm solid mass involving the upper outer quadrant of the left breast which accounts for a mammographic finding. 5. No pathologic left axillary lymphadenopathy.    09/09/2017 Breast US    Targeted right breast ultrasound is performed, showing a very large hypoechoic mass with irregular margins extending from the approximate 9 o'clock position through the 12 to 1 o'clock position. On the CC gait image, the mass measures maximally approximately 7.5 cm. The mass has irregular margins, demonstrates acoustic shadowing, and  demonstrates internal power Doppler flow. The mass extends into the retroareolar region and involves the nipple, accounting for the nipple retraction.  Separate from the dominant mass at the 7 o'clock position approximately 4 cm from nipple is a hypoechoic antiparallel mass with irregular margins measuring approximately 1.4 x 2.7 x 1.7 cm.  Separate from the dominant mass at the 1 o'clock position approximately 2 cm from nipple is a hypoechoic mass with irregular margins measuring approximately 1.7 x 0.5 x 1.6 cm. Both of these masses also demonstrate internal power Doppler flow.  Sonographic evaluation of the right axilla demonstrates 2 adjacent pathologic lymph nodes, the larger measuring approximately 2.5 x 2.5 x 2.8 cm, the smaller measuring approximately 2.0 x 0.8 x 2.7 cm.    09/23/2017 Initial Biopsy    Diagnosis 1. Breast, left, needle core biopsy, 2 o'clock - INVASIVE DUCTAL CARCINOMA. - SEE COMMENT. 2. Breast, right, needle core biopsy, 11:30 o'clock - INVASIVE MAMMARY CARCINOMA. - SEE COMMENT. 3. Lymph node, needle/core biopsy, right axilla - METASTATIC MAMMARY CARCINOMA IN 1 OF 1 LYMPH NODE (1/1). Microscopic Comment 1. There is a small focus of grade I invasive ductal carcinoma. A complete breast prognostic profile will be attempted and the results reported separately. The results were called to the [facility] on 09/24/2017. 2. The carcinoma appears grade II. An E-Cadherin stain and a breast prognostic profile will be performed on part 2 and the results reported separately. (JBK:kh 09-24-17) [name]  1. HER2 Negative by FISH Estrogen receptor: 100% positive, strong staining Progesterone receptor: 80% positive, strong staining Ki67: 5%  2. HER2 Negative by FISH Estrogen receptor: 100% positive, strong staining Progesterone receptor: 0%, negative  Ki67: 30% 2. The tumor cells are strongly positive  for E-cadherin, supporting a ductal phenotype     09/23/2017 Cancer Staging    Staging form: Breast, AJCC 8th Edition - Clinical stage from 09/23/2017: Stage IIIA (cT3, cN1, cM0, G2, ER+, PR-, HER2-) - Signed by [name], MD on 10/09/2017    10/07/2017 Breast MRI    IMPRESSION: 1. Biopsy proven malignancy involving all 4 quadrants of the right breast, although predominantly located in the anterior third of the central upper right breast measures 6.8 x 9.6 x 7.2 cm.  2. Five pathologic right axillary lymph nodes, compatible with biopsy proven axillary metastases.  3. Small mass with associated biopsy related changes in the upper-outer left breast at site of known malignancy measures 0.9 x 0.8 x 0.7 cm. No abnormal lymph nodes seen in the left axilla.  RECOMMENDATION: Treatment plan for known bilateral breast malignancy.  BI-RADS CATEGORY  6: Known biopsy-proven malignancy.    10/08/2017 Initial Diagnosis    Cancer of overlapping sites of right female breast ([place])    10/13/2017 Pathology Results    Diagnosis 1. Breast, right, needle core biopsy, 1 o'clock - INVASIVE DUCTAL CARCINOMA. - SEE COMMENT. 2. Breast, right, needle core biopsy, 7 o'clock - INVASIVE DUCTAL CARCINOMA. - SEE COMMENT. Microscopic Comment 1. and 2. The carcinoma in parts 1 and 2 is morphologically similar and appears grade II. Because breast prognostic profiles were performed on the patients previous case, [id number], it will not be repeated on the current case unless requested.     10/17/2017 Imaging    CT CAP IMPRESSION: 1. Infiltrative right breast mass with overlying skin thickening is identified compatible with known breast cancer. 2. Enlarged right axillary lymph nodes compatible with metastatic adenopathy. 3. No additional sites of disease identified within the chest and no evidence for metastasis to the abdomen or pelvis. 4. Aortic atherosclerosis and 3 vessel coronary artery atherosclerotic calcifications. Aortic Atherosclerosis (ICD10-I70.0).     10/17/2017 Imaging    Bone Scan FINDINGS: There are no foci of increased or decreased radiotracer uptake to suggest osseous metastatic disease. 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 There is degenerative type uptake within both shoulders and the left ankle.  Normal physiologic activity is identified within the kidneys and urinary bladder.  IMPRESSION: No evidence of osseous metastatic disease.    10/22/2017 -  Chemotherapy    AC q2 weeks x4 cycles starting 10/22/17-12/03/17, followed by weekly taxol x12 starting 12/17/17    11/14/2017 Genetic Testing    The genes that were analyzed were the 83 genes on [facility]'s Multi-Cancer panel (ALK, APC, ATM, AXIN2, BAP1, BARD1, BLM, BMPR1A, BRCA1, BRCA2, BRIP1, CASR, CDC73, CDH1, CDK4, CDKN1B, CDKN1C, CDKN2A, CEBPA, CHEK2, CTNNA1, DICER1, DIS3L2, EGFR, EPCAM, FH, FLCN, GATA2, GPC3, GREM1, HOXB13, HRAS, KIT, MAX, MEN1, MET, MITF, MLH1, MSH2, MSH3, MSH6, MUTYH, NBN, NF1, NF2, NTHL1, PALB2, PDGFRA, PHOX2B, PMS2, POLD1, POLE, POT1, PRKAR1A, PTCH1, PTEN, RAD50, RAD51C, RAD51D, RB1, RECQL4, RET, RUNX1, SDHA, SDHAF2, SDHB, SDHC, SDHD, SMAD4, SMARCA4, SMARCB1, SMARCE1, STK11, SUFU, TERC, TERT, TMEM127, TP53, TSC1, TSC2, VHL, WRN, WT1).  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    12/16/2017 Breast US    IMPRESSION: 1. Stable to minimal decrease in size of multiple irregular masses throughout the right breast consistent with the patient's sites of biopsy proven malignancy. While the mammographic appearance is slightly less dense in this  region, there is increased skin and trabecular thickening involving the entire right breast. 2. Stable appearance of morphologically abnormal right axillary lymph nodes, consistent with biopsy proven  metastatic disease. 3. Stable appearance of a left breast mass, consistent with biopsy proven malignancy.      Malignant neoplasm of left breast in female, estrogen receptor positive ([place])   09/09/2017 Breast US    Targeted left breast ultrasound is performed, showing an oval parallel hypoechoic mass containing microcalcifications at the 2 o'clock position approximately 6 cm from nipple measuring approximately 0.9 x 0.5 x 0.9 cm, corresponding to the mammographic finding.  Sonographic evaluation of the left axilla demonstrates no pathologic lymphadenopathy.    09/23/2017 Initial Biopsy    Diagnosis 1. Breast, left, needle core biopsy, 2 o'clock - INVASIVE DUCTAL CARCINOMA. - SEE COMMENT. 2. Breast, right, needle core biopsy, 11:30 o'clock - INVASIVE MAMMARY CARCINOMA. - SEE COMMENT. 3. Lymph node, needle/core biopsy, right axilla - METASTATIC MAMMARY CARCINOMA IN 1 OF 1 LYMPH NODE (1/1). Microscopic Comment 1. There is a small focus of grade I invasive ductal carcinoma. A complete breast prognostic profile will be attempted and the results reported separately. The results were called to the [facility] on 09/24/2017. 2. The carcinoma appears grade II. An E-Cadherin stain and a breast prognostic profile will be performed on part 2 and the results reported separately. (JBK:kh 09-24-17) [name]  1. HER2 Negative by FISH Estrogen receptor: 100% positive, strong staining Progesterone receptor: 80% positive, strong staining Ki67: 5%  2. HER2 Negative by FISH Estrogen receptor: 100% positive, strong staining Progesterone receptor: 0%, negative  Ki67: 30%    10/08/2017 Initial Diagnosis    Malignant neoplasm of left breast in female, estrogen receptor positive ([place])    12/16/2017 Breast US    IMPRESSION: 3. Stable appearance of a left breast mass, consistent with biopsy proven malignancy.     01/22/2018 Imaging    01/22/2018 MRI Bilateral Breast IMPRESSION: 1.  Dominant enhancing mass with surrounding nodularity involving the central anterior third of the right breast now measures up to 6.4 x 6.7 x 7.6 cm (previously measured 6.8 x 9.6 x 7.2 cm). Persistent bulky right axillary lymphadenopathy, slightly decreased in size since prior MRI.  2. Biopsy proven left breast malignancy now measures 0.5 cm (previously measured 0.9 x 0.8 x 0.7 cm).    CURRENT THERAPY : Neoadjuvant chemo AC q2 weeks x4 cycles starting 10/22/17-12/03/17, followed by weekly taxol x12 starting 12/17/17  INTERVAL HISTORY: Ms. Spikes returns for follow up as scheduled and next cycle weekly taxol. She completed cycle 9 on 02/18/18. She had a dizzy spell with sweating at work on 9/16. She drove home and rested and recovered, was able to go back to work. She had 2 episodes like this back in the spring before she was diagnosed. Denies lightheadedness, n/v, chest pain, dyspnea, or other symptoms. She had eaten recently and did not feel dehydrated. Episode did not recur. She has good appetite. No recent fever or chills. Nails are brittle. Has mild tingling to thumb, and first 2 fingers of right hand, none on left hand or toes. She saw [name] recently, who explained her surgical options; she is still considering what to do with reconstruction.    MEDICAL HISTORY:  Past Medical History:  Diagnosis Date  . Adopted   . Arthritis   . breast ca dx'd 08/2017  . Genetic testing 11/14/2017   Multi-Cancer panel (83 genes) @ [facility] - Pathogenic mutation in  the MITF gene  . GERD (gastroesophageal reflux disease)   . 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 IR IMAGING GUIDED PORT INSERTION  10/20/2017  . IR US GUIDE VASC ACCESS LEFT  10/20/2017  . TONSILLECTOMY Bilateral   . TUBAL LIGATION      I have reviewed the social history and family history with  the patient and they are unchanged from previous note.  ALLERGIES:  is allergic to bee venom.  MEDICATIONS:  Current Outpatient Medications  Medication Sig Dispense Refill  . Coenzyme Q10 100 MG capsule Take 100 mg by mouth daily.     [name] lidocaine-prilocaine (EMLA) cream Apply to affected area once 30 g 3  . ondansetron (ZOFRAN) 8 MG tablet Take 1 tablet (8 mg total) by mouth 2 (two) times daily as needed. Start on the third day after chemotherapy. 30 tablet 1  . prochlorperazine (COMPAZINE) 10 MG tablet Take 1 tablet (10 mg total) by mouth every 6 (six) hours as needed (Nausea or vomiting). 60 tablet 1  . vitamin B-12 (CYANOCOBALAMIN) 1000 MCG tablet Take 1,000 mcg by mouth daily.    . vitamin E 100 UNIT capsule Take 100 Units by mouth daily.     No current facility-administered medications for this visit.    Facility-Administered Medications Ordered in Other Visits  Medication Dose Route Frequency Provider Last Rate Last Dose  . dexamethasone (DECADRON) 20 mg in sodium chloride 0.9 % 50 mL IVPB  20 mg Intravenous Once [name], MD      . famotidine (PEPCID) IVPB 20 mg premix  20 mg Intravenous Once [name], MD 200 mL/hr at 02/25/18 1542 20 mg at 02/25/18 1542  . heparin lock flush 100 unit/mL  500 Units Intracatheter Once PRN [name], MD      . PACLitaxel (TAXOL) 150 mg in sodium chloride 0.9 % 250 mL chemo infusion (</= 8m/m2)  80 mg/m2 (Treatment Plan Recorded) Intravenous Once FTruitt Merle MD      . sodium chloride flush (NS) 0.9 % injection 10 mL  10 mL Intracatheter PRN FTruitt Merle MD        PHYSICAL EXAMINATION: ECOG PERFORMANCE STATUS: 1 - Symptomatic but completely ambulatory  Vitals:   02/25/18 1415  BP: (!) 166/94  Pulse: 76  Resp: 17  Temp: 98.2 F (36.8 C)  SpO2: 100%   Filed Weights   02/25/18 1415  Weight: 172 lb (78 kg)    GENERAL:alert, no distress and comfortable SKIN: skin color, texture, turgor are normal, no rashes or significant lesions EYES:  sclera clear OROPHARYNX:no thrush or ulcers  LYMPH:  no palpable cervical or supraclavicular lymphadenopathy  LUNGS: clear to auscultation with normal breathing effort HEART: regular rate & rhythm, no lower extremity edema ABDOMEN:abdomen soft, non-tender and normal bowel sounds Musculoskeletal:no cyanosis of digits and no clubbing  NEURO: alert & oriented x 3 with fluent speech, no focal motor/sensory deficits Breast exam reveals stable right central breat mass measuring 7x5 cm, no palpable axillary adenopathy or left breast mass  PAC without erythema    LABORATORY DATA:  I have reviewed the data as listed CBC Latest Ref Rng & Units 02/25/2018 02/18/2018 02/11/2018  WBC 3.9 - 10.3 K/uL 6.3 5.6 5.0  Hemoglobin 11.6 - 15.9 g/dL 12.0 12.2 11.9  Hematocrit 34.8 - 46.6 % 35.3 36.5 35.2  Platelets 145 - 400 K/uL 207 195 214  CMP Latest Ref Rng & Units 02/25/2018 02/18/2018 02/11/2018  Glucose 70 - 99 mg/dL 89 103(H) 99  BUN 8 - 23 mg/dL 20 21 25(H)  Creatinine 0.44 - 1.00 mg/dL 0.70 0.73 0.70  Sodium 135 - 145 mmol/L 143 142 142  Potassium 3.5 - 5.1 mmol/L 3.7 4.3 4.0  Chloride 98 - 111 mmol/L 108 110 109  CO2 22 - 32 mmol/L _0 Calcium 8.9 - 10.3 mg/dL 9.0 8.8(L) 9.1  Total Protein 6.5 - 8.1 g/dL 6.0(L) 6.0(L) 6.0(L)  Total Bilirubin 0.3 - 1.2 mg/dL <0.2(L) 0.4 0.2(L)  Alkaline Phos 38 - 126 U/L 49 49 51  AST 15 - 41 U/L 16 24 14(L)  ALT 0 - 44 U/L _1 RADIOGRAPHIC STUDIES: I have personally reviewed the radiological images as listed and agreed with the findings in the report. No results found.   ASSESSMENT & PLAN: Darlene Beasley is a 62 y.o. Caucasian female with history of substance usepresented with right nipple inversion for 3 months.    1. Bilateral breast cancer: right breast, invasiveductalcarcinoma in right, grade II, stage IIIA (cT3N1M0), ER 100% positive, PR 0%, negative, HER2 negative by FISH, Ki67 30% metastatic to 5 axillary lymph nodes; left  breast - invasive ductal carcinoma, grade I, stage IA (cT1bN0M0), ER 100% positive, PR 80% positive, HER2 negative, Ki67 5% 2. Social support 3. Substance use - down to 5 cigarettes, encouraged her to stop completely  4. Insomnia 5.  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  Ms. Curran appears stable. She completed cycle 9 neoadjuvant taxol. She continues to tolerate treatment very well. She had a dizzy spell, I encouraged her to eat normally and hydrate well, especially at work. She agrees. Her breast exam is stable. BP remains elevated on multiple office visits and recheck in clinic. I previously recommended anti-hypertensive and she ultimately agrees today, will start amlodipine 5 mg daily. CBC and CMP reviewed, adequate to proceed with cycle 10. She will return in 1 week for lab and chemo, and f/u in 2 weeks with her final cycle.   She has met with [name] and is still considering her reconstructive surgical options.   PLAN: -Labs reviewed, proceed with cycle 10 taxol today, continue weekly for total 12 cycles -Return in 1 week for cycle 11 -F/u in 2 weeks with cycle 12 (final dose) -begin amlodipine 5 mg daily   All questions were answered. The patient knows to call the clinic with any problems, questions or concerns. No barriers to learning was detected.     Alla Feeling, NP 02/25/18

## 2018-02-25 ENCOUNTER — Telehealth: Payer: Self-pay | Admitting: Hematology

## 2018-02-25 ENCOUNTER — Inpatient Hospital Stay (HOSPITAL_BASED_OUTPATIENT_CLINIC_OR_DEPARTMENT_OTHER): Payer: Medicaid Other | Admitting: Nurse Practitioner

## 2018-02-25 ENCOUNTER — Inpatient Hospital Stay: Payer: Medicaid Other

## 2018-02-25 ENCOUNTER — Encounter: Payer: Self-pay | Admitting: Nurse Practitioner

## 2018-02-25 VITALS — BP 166/94 | HR 76 | Temp 98.2°F | Resp 17 | Ht 64.0 in | Wt 172.0 lb

## 2018-02-25 VITALS — BP 165/89 | HR 78 | Resp 18

## 2018-02-25 DIAGNOSIS — C50811 Malignant neoplasm of overlapping sites of right female breast: Secondary | ICD-10-CM

## 2018-02-25 DIAGNOSIS — Z170 Estrogen receptor positive status [ER+]: Principal | ICD-10-CM

## 2018-02-25 DIAGNOSIS — Z95828 Presence of other vascular implants and grafts: Secondary | ICD-10-CM

## 2018-02-25 DIAGNOSIS — C50412 Malignant neoplasm of upper-outer quadrant of left female breast: Secondary | ICD-10-CM | POA: Diagnosis not present

## 2018-02-25 DIAGNOSIS — C773 Secondary and unspecified malignant neoplasm of axilla and upper limb lymph nodes: Secondary | ICD-10-CM

## 2018-02-25 DIAGNOSIS — F17210 Nicotine dependence, cigarettes, uncomplicated: Secondary | ICD-10-CM

## 2018-02-25 DIAGNOSIS — Z79899 Other long term (current) drug therapy: Secondary | ICD-10-CM

## 2018-02-25 DIAGNOSIS — I10 Essential (primary) hypertension: Secondary | ICD-10-CM

## 2018-02-25 DIAGNOSIS — G629 Polyneuropathy, unspecified: Secondary | ICD-10-CM

## 2018-02-25 DIAGNOSIS — I429 Cardiomyopathy, unspecified: Secondary | ICD-10-CM

## 2018-02-25 LAB — CMP (CANCER CENTER ONLY)
ALBUMIN: 3.5 g/dL (ref 3.5–5.0)
ALT: 17 U/L (ref 0–44)
AST: 16 U/L (ref 15–41)
Alkaline Phosphatase: 49 U/L (ref 38–126)
Anion gap: 7 (ref 5–15)
BUN: 20 mg/dL (ref 8–23)
CO2: 28 mmol/L (ref 22–32)
Calcium: 9 mg/dL (ref 8.9–10.3)
Chloride: 108 mmol/L (ref 98–111)
Creatinine: 0.7 mg/dL (ref 0.44–1.00)
GFR, Est AFR Am: >60 mL/min (ref >60)
GFR, Estimated: >60 mL/min (ref >60)
Glucose, Bld: 89 mg/dL (ref 70–99)
POTASSIUM: 3.7 mmol/L (ref 3.5–5.1)
Sodium: 143 mmol/L (ref 135–145)
Total Bilirubin: <0.2 mg/dL — ABNORMAL LOW (ref 0.3–1.2)
Total Protein: 6 g/dL — ABNORMAL LOW (ref 6.5–8.1)

## 2018-02-25 LAB — CBC WITH DIFFERENTIAL (CANCER CENTER ONLY)
BASOS ABS: 0.1 10*3/uL (ref 0.0–0.1)
BASOS PCT: 1 %
Eosinophils Absolute: 0.1 10*3/uL (ref 0.0–0.5)
Eosinophils Relative: 2 %
HCT: 35.3 % (ref 34.8–46.6)
Hemoglobin: 12 g/dL (ref 11.6–15.9)
LYMPHS ABS: 1.3 10*3/uL (ref 0.9–3.3)
Lymphocytes Relative: 21 %
MCH: 33.8 pg (ref 25.1–34.0)
MCHC: 33.9 g/dL (ref 31.5–36.0)
MCV: 99.8 fL (ref 79.5–101.0)
MONO ABS: 0.3 10*3/uL (ref 0.1–0.9)
Monocytes Relative: 5 %
Neutro Abs: 4.5 10*3/uL (ref 1.5–6.5)
Neutrophils Relative %: 71 %
Platelet Count: 207 10*3/uL (ref 145–400)
RBC: 3.54 MIL/uL — ABNORMAL LOW (ref 3.70–5.45)
RDW: 14.5 % (ref 11.2–14.5)
WBC Count: 6.3 10*3/uL (ref 3.9–10.3)

## 2018-02-25 MED ORDER — SODIUM CHLORIDE 0.9% FLUSH
10.0000 mL | INTRAVENOUS | Status: DC | PRN
  Administered 2018-02-25: 10 mL
  Filled 2018-02-25: qty 10

## 2018-02-25 MED ORDER — FAMOTIDINE IN NACL 20-0.9 MG/50ML-% IV SOLN
20.0000 mg | Freq: Once | INTRAVENOUS | Status: AC
  Administered 2018-02-25: 20 mg via INTRAVENOUS

## 2018-02-25 MED ORDER — SODIUM CHLORIDE 0.9 % IV SOLN
20.0000 mg | Freq: Once | INTRAVENOUS | Status: AC
  Administered 2018-02-25: 20 mg via INTRAVENOUS
  Filled 2018-02-25: qty 2

## 2018-02-25 MED ORDER — SODIUM CHLORIDE 0.9 % IV SOLN
Freq: Once | INTRAVENOUS | Status: AC
  Administered 2018-02-25: 16:00:00 via INTRAVENOUS
  Filled 2018-02-25: qty 250

## 2018-02-25 MED ORDER — DIPHENHYDRAMINE HCL 50 MG/ML IJ SOLN
INTRAMUSCULAR | Status: AC
  Filled 2018-02-25: qty 1

## 2018-02-25 MED ORDER — DIPHENHYDRAMINE HCL 50 MG/ML IJ SOLN
50.0000 mg | Freq: Once | INTRAMUSCULAR | Status: AC
  Administered 2018-02-25: 50 mg via INTRAVENOUS

## 2018-02-25 MED ORDER — FAMOTIDINE IN NACL 20-0.9 MG/50ML-% IV SOLN
INTRAVENOUS | Status: AC
  Filled 2018-02-25: qty 50

## 2018-02-25 MED ORDER — HEPARIN SOD (PORK) LOCK FLUSH 100 UNIT/ML IV SOLN
500.0000 [IU] | Freq: Once | INTRAVENOUS | Status: AC | PRN
  Administered 2018-02-25: 500 [IU]
  Filled 2018-02-25: qty 5

## 2018-02-25 MED ORDER — AMLODIPINE BESYLATE 5 MG PO TABS: 5.0000 mg | ORAL_TABLET | Freq: Every day | ORAL | 0 refills | Status: DC

## 2018-02-25 MED ORDER — SODIUM CHLORIDE 0.9% FLUSH
10.0000 mL | Freq: Once | INTRAVENOUS | Status: AC
  Administered 2018-02-25: 10 mL
  Filled 2018-02-25: qty 10

## 2018-02-25 MED ORDER — SODIUM CHLORIDE 0.9 % IV SOLN
80.0000 mg/m2 | Freq: Once | INTRAVENOUS | Status: AC
  Administered 2018-02-25: 150 mg via INTRAVENOUS
  Filled 2018-02-25: qty 25

## 2018-02-25 NOTE — Patient Instructions (Signed)
[facility] Discharge Instructions for Patients Receiving Chemotherapy  Today you received the following chemotherapy agents paclitaxel (Taxol) To help prevent nausea and vomiting after your treatment, we encourage you to take your nausea medication as directed   If you develop nausea and vomiting that is not controlled by your nausea medication, call the clinic.   BELOW ARE SYMPTOMS THAT SHOULD BE REPORTED IMMEDIATELY:  *FEVER GREATER THAN 100.5 F  *CHILLS WITH OR WITHOUT FEVER  NAUSEA AND VOMITING THAT IS NOT CONTROLLED WITH YOUR NAUSEA MEDICATION  *UNUSUAL SHORTNESS OF BREATH  *UNUSUAL BRUISING OR BLEEDING  TENDERNESS IN MOUTH AND THROAT WITH OR WITHOUT PRESENCE OF ULCERS  *URINARY PROBLEMS  *BOWEL PROBLEMS  UNUSUAL RASH Items with * indicate a potential emergency and should be followed up as soon as possible.  Feel free to call the clinic should you have any questions or concerns. The clinic phone number is [phone].  Please show the CHEMO ALERT CARD at check-in to the Emergency Department and triage nurse.   

## 2018-02-25 NOTE — Telephone Encounter (Signed)
Appts scheduled patient to get updated schedule from infusion before she  leaves today per 9/18 los

## 2018-02-26 ENCOUNTER — Ambulatory Visit: Payer: Medicaid Other

## 2018-02-26 ENCOUNTER — Ambulatory Visit: Payer: Medicaid Other | Admitting: Nurse Practitioner

## 2018-02-26 ENCOUNTER — Other Ambulatory Visit: Payer: Medicaid Other

## 2018-02-26 LAB — CANCER ANTIGEN 27.29: CA 27.29: 19.8 U/mL (ref 0.0–38.6)

## 2018-03-04 ENCOUNTER — Inpatient Hospital Stay: Payer: Medicaid Other

## 2018-03-04 ENCOUNTER — Ambulatory Visit: Payer: Medicaid Other | Admitting: Nurse Practitioner

## 2018-03-04 VITALS — BP 169/83 | HR 75 | Temp 98.0°F | Resp 17 | Wt 173.0 lb

## 2018-03-04 DIAGNOSIS — C50811 Malignant neoplasm of overlapping sites of right female breast: Secondary | ICD-10-CM | POA: Diagnosis not present

## 2018-03-04 DIAGNOSIS — Z170 Estrogen receptor positive status [ER+]: Principal | ICD-10-CM

## 2018-03-04 LAB — CBC WITH DIFFERENTIAL (CANCER CENTER ONLY)
BASOS ABS: 0.1 10*3/uL (ref 0.0–0.1)
Basophils Relative: 1 %
EOS ABS: 0.1 10*3/uL (ref 0.0–0.5)
Eosinophils Relative: 2 %
HCT: 36.9 % (ref 34.8–46.6)
Hemoglobin: 12.5 g/dL (ref 11.6–15.9)
Lymphocytes Relative: 24 %
Lymphs Abs: 1.3 10*3/uL (ref 0.9–3.3)
MCH: 33.5 pg (ref 25.1–34.0)
MCHC: 33.9 g/dL (ref 31.5–36.0)
MCV: 98.6 fL (ref 79.5–101.0)
Monocytes Absolute: 0.4 10*3/uL (ref 0.1–0.9)
Monocytes Relative: 7 %
NEUTROS ABS: 3.7 10*3/uL (ref 1.5–6.5)
NEUTROS PCT: 66 %
PLATELETS: 211 10*3/uL (ref 145–400)
RBC: 3.74 MIL/uL (ref 3.70–5.45)
RDW: 14.4 % (ref 11.2–14.5)
WBC: 5.5 10*3/uL (ref 3.9–10.3)

## 2018-03-04 LAB — CMP (CANCER CENTER ONLY)
ALK PHOS: 51 U/L (ref 38–126)
ALT: 19 U/L (ref 0–44)
ANION GAP: 7 (ref 5–15)
AST: 16 U/L (ref 15–41)
Albumin: 3.6 g/dL (ref 3.5–5.0)
BUN: 18 mg/dL (ref 8–23)
CALCIUM: 9.2 mg/dL (ref 8.9–10.3)
CO2: 28 mmol/L (ref 22–32)
CREATININE: 0.67 mg/dL (ref 0.44–1.00)
Chloride: 107 mmol/L (ref 98–111)
GFR, Est AFR Am: >60 mL/min (ref >60)
Glucose, Bld: 97 mg/dL (ref 70–99)
Potassium: 3.9 mmol/L (ref 3.5–5.1)
Sodium: 142 mmol/L (ref 135–145)
TOTAL PROTEIN: 6 g/dL — AB (ref 6.5–8.1)
Total Bilirubin: <0.2 mg/dL — ABNORMAL LOW (ref 0.3–1.2)

## 2018-03-04 MED ORDER — SODIUM CHLORIDE 0.9% FLUSH
10.0000 mL | INTRAVENOUS | Status: DC | PRN
  Administered 2018-03-04: 10 mL
  Filled 2018-03-04: qty 10

## 2018-03-04 MED ORDER — FAMOTIDINE IN NACL 20-0.9 MG/50ML-% IV SOLN
INTRAVENOUS | Status: AC
  Filled 2018-03-04: qty 50

## 2018-03-04 MED ORDER — SODIUM CHLORIDE 0.9 % IV SOLN
Freq: Once | INTRAVENOUS | Status: AC
  Administered 2018-03-04: 14:00:00 via INTRAVENOUS
  Filled 2018-03-04: qty 250

## 2018-03-04 MED ORDER — DIPHENHYDRAMINE HCL 50 MG/ML IJ SOLN
INTRAMUSCULAR | Status: AC
  Filled 2018-03-04: qty 1

## 2018-03-04 MED ORDER — FAMOTIDINE IN NACL 20-0.9 MG/50ML-% IV SOLN
20.0000 mg | Freq: Once | INTRAVENOUS | Status: AC
  Administered 2018-03-04: 20 mg via INTRAVENOUS

## 2018-03-04 MED ORDER — HEPARIN SOD (PORK) LOCK FLUSH 100 UNIT/ML IV SOLN
500.0000 [IU] | Freq: Once | INTRAVENOUS | Status: AC | PRN
  Administered 2018-03-04: 500 [IU]
  Filled 2018-03-04: qty 5

## 2018-03-04 MED ORDER — DIPHENHYDRAMINE HCL 50 MG/ML IJ SOLN
50.0000 mg | Freq: Once | INTRAMUSCULAR | Status: AC
  Administered 2018-03-04: 50 mg via INTRAVENOUS

## 2018-03-04 MED ORDER — SODIUM CHLORIDE 0.9 % IV SOLN
80.0000 mg/m2 | Freq: Once | INTRAVENOUS | Status: AC
  Administered 2018-03-04: 150 mg via INTRAVENOUS
  Filled 2018-03-04: qty 25

## 2018-03-04 MED ORDER — SODIUM CHLORIDE 0.9 % IV SOLN
20.0000 mg | Freq: Once | INTRAVENOUS | Status: AC
  Administered 2018-03-04: 20 mg via INTRAVENOUS
  Filled 2018-03-04: qty 2

## 2018-03-04 NOTE — Patient Instructions (Signed)
[facility] Discharge Instructions for Patients Receiving Chemotherapy  Today you received the following chemotherapy agents:  Taxol.  To help prevent nausea and vomiting after your treatment, we encourage you to take your nausea medication as directed.   If you develop nausea and vomiting that is not controlled by your nausea medication, call the clinic.   BELOW ARE SYMPTOMS THAT SHOULD BE REPORTED IMMEDIATELY:  *FEVER GREATER THAN 100.5 F  *CHILLS WITH OR WITHOUT FEVER  NAUSEA AND VOMITING THAT IS NOT CONTROLLED WITH YOUR NAUSEA MEDICATION  *UNUSUAL SHORTNESS OF BREATH  *UNUSUAL BRUISING OR BLEEDING  TENDERNESS IN MOUTH AND THROAT WITH OR WITHOUT PRESENCE OF ULCERS  *URINARY PROBLEMS  *BOWEL PROBLEMS  UNUSUAL RASH Items with * indicate a potential emergency and should be followed up as soon as possible.  Feel free to call the clinic should you have any questions or concerns. The clinic phone number is [phone].  Please show the CHEMO ALERT CARD at check-in to the Emergency Department and triage nurse.   

## 2018-03-05 ENCOUNTER — Other Ambulatory Visit: Payer: Medicaid Other

## 2018-03-05 ENCOUNTER — Ambulatory Visit: Payer: Medicaid Other

## 2018-03-11 ENCOUNTER — Inpatient Hospital Stay: Payer: Medicaid Other

## 2018-03-11 ENCOUNTER — Inpatient Hospital Stay (HOSPITAL_BASED_OUTPATIENT_CLINIC_OR_DEPARTMENT_OTHER): Payer: Medicaid Other | Admitting: Nurse Practitioner

## 2018-03-11 ENCOUNTER — Inpatient Hospital Stay: Payer: Medicaid Other | Attending: Nurse Practitioner

## 2018-03-11 ENCOUNTER — Encounter: Payer: Self-pay | Admitting: Nurse Practitioner

## 2018-03-11 VITALS — BP 157/92 | HR 79 | Temp 98.3°F | Resp 18 | Ht 64.0 in | Wt 175.4 lb

## 2018-03-11 DIAGNOSIS — C50412 Malignant neoplasm of upper-outer quadrant of left female breast: Secondary | ICD-10-CM

## 2018-03-11 DIAGNOSIS — Z170 Estrogen receptor positive status [ER+]: Secondary | ICD-10-CM

## 2018-03-11 DIAGNOSIS — R600 Localized edema: Secondary | ICD-10-CM | POA: Diagnosis not present

## 2018-03-11 DIAGNOSIS — C50811 Malignant neoplasm of overlapping sites of right female breast: Secondary | ICD-10-CM

## 2018-03-11 DIAGNOSIS — G4700 Insomnia, unspecified: Secondary | ICD-10-CM | POA: Diagnosis not present

## 2018-03-11 DIAGNOSIS — Z79899 Other long term (current) drug therapy: Secondary | ICD-10-CM

## 2018-03-11 DIAGNOSIS — F17210 Nicotine dependence, cigarettes, uncomplicated: Secondary | ICD-10-CM | POA: Diagnosis not present

## 2018-03-11 DIAGNOSIS — Z5111 Encounter for antineoplastic chemotherapy: Secondary | ICD-10-CM | POA: Diagnosis not present

## 2018-03-11 DIAGNOSIS — I10 Essential (primary) hypertension: Secondary | ICD-10-CM | POA: Insufficient documentation

## 2018-03-11 DIAGNOSIS — Z9013 Acquired absence of bilateral breasts and nipples: Secondary | ICD-10-CM

## 2018-03-11 DIAGNOSIS — G620 Drug-induced polyneuropathy: Secondary | ICD-10-CM

## 2018-03-11 DIAGNOSIS — D493 Neoplasm of unspecified behavior of breast: Secondary | ICD-10-CM

## 2018-03-11 DIAGNOSIS — T451X5A Adverse effect of antineoplastic and immunosuppressive drugs, initial encounter: Secondary | ICD-10-CM | POA: Diagnosis not present

## 2018-03-11 DIAGNOSIS — Z95828 Presence of other vascular implants and grafts: Secondary | ICD-10-CM

## 2018-03-11 LAB — CMP (CANCER CENTER ONLY)
ALBUMIN: 3.4 g/dL — AB (ref 3.5–5.0)
ALK PHOS: 50 U/L (ref 38–126)
ALT: 19 U/L (ref 0–44)
AST: 17 U/L (ref 15–41)
Anion gap: 7 (ref 5–15)
BUN: 25 mg/dL — AB (ref 8–23)
CALCIUM: 8.8 mg/dL — AB (ref 8.9–10.3)
CO2: 26 mmol/L (ref 22–32)
CREATININE: 0.69 mg/dL (ref 0.44–1.00)
Chloride: 109 mmol/L (ref 98–111)
GFR, Estimated: >60 mL/min (ref >60)
GLUCOSE: 92 mg/dL (ref 70–99)
Potassium: 3.9 mmol/L (ref 3.5–5.1)
Sodium: 142 mmol/L (ref 135–145)
Total Bilirubin: <0.2 mg/dL — ABNORMAL LOW (ref 0.3–1.2)
Total Protein: 5.9 g/dL — ABNORMAL LOW (ref 6.5–8.1)

## 2018-03-11 LAB — CBC WITH DIFFERENTIAL (CANCER CENTER ONLY)
BASOS ABS: 0 10*3/uL (ref 0.0–0.1)
Basophils Relative: 1 %
Eosinophils Absolute: 0.1 10*3/uL (ref 0.0–0.5)
Eosinophils Relative: 2 %
HCT: 37.7 % (ref 34.8–46.6)
HEMOGLOBIN: 12.7 g/dL (ref 11.6–15.9)
LYMPHS ABS: 1.6 10*3/uL (ref 0.9–3.3)
LYMPHS PCT: 24 %
MCH: 32.9 pg (ref 25.1–34.0)
MCHC: 33.7 g/dL (ref 31.5–36.0)
MCV: 97.7 fL (ref 79.5–101.0)
MONOS PCT: 6 %
Monocytes Absolute: 0.4 10*3/uL (ref 0.1–0.9)
NEUTROS ABS: 4.4 10*3/uL (ref 1.5–6.5)
Neutrophils Relative %: 67 %
PLATELETS: 215 10*3/uL (ref 145–400)
RBC: 3.86 MIL/uL (ref 3.70–5.45)
RDW: 14.3 % (ref 11.2–14.5)
WBC: 6.5 10*3/uL (ref 3.9–10.3)

## 2018-03-11 MED ORDER — SODIUM CHLORIDE 0.9 % IV SOLN
20.0000 mg | Freq: Once | INTRAVENOUS | Status: AC
  Administered 2018-03-11: 20 mg via INTRAVENOUS
  Filled 2018-03-11: qty 2

## 2018-03-11 MED ORDER — HEPARIN SOD (PORK) LOCK FLUSH 100 UNIT/ML IV SOLN
500.0000 [IU] | Freq: Once | INTRAVENOUS | Status: AC | PRN
  Administered 2018-03-11: 500 [IU]
  Filled 2018-03-11: qty 5

## 2018-03-11 MED ORDER — FAMOTIDINE IN NACL 20-0.9 MG/50ML-% IV SOLN
INTRAVENOUS | Status: AC
  Filled 2018-03-11: qty 50

## 2018-03-11 MED ORDER — FAMOTIDINE IN NACL 20-0.9 MG/50ML-% IV SOLN
20.0000 mg | Freq: Once | INTRAVENOUS | Status: AC
  Administered 2018-03-11: 20 mg via INTRAVENOUS

## 2018-03-11 MED ORDER — SODIUM CHLORIDE 0.9 % IV SOLN
Freq: Once | INTRAVENOUS | Status: AC
  Administered 2018-03-11: 15:00:00 via INTRAVENOUS
  Filled 2018-03-11: qty 250

## 2018-03-11 MED ORDER — SODIUM CHLORIDE 0.9% FLUSH
10.0000 mL | INTRAVENOUS | Status: DC | PRN
  Administered 2018-03-11: 10 mL
  Filled 2018-03-11: qty 10

## 2018-03-11 MED ORDER — DIPHENHYDRAMINE HCL 50 MG/ML IJ SOLN
50.0000 mg | Freq: Once | INTRAMUSCULAR | Status: AC
  Administered 2018-03-11: 50 mg via INTRAVENOUS

## 2018-03-11 MED ORDER — SODIUM CHLORIDE 0.9 % IV SOLN
80.0000 mg/m2 | Freq: Once | INTRAVENOUS | Status: AC
  Administered 2018-03-11: 150 mg via INTRAVENOUS
  Filled 2018-03-11: qty 25

## 2018-03-11 MED ORDER — DIPHENHYDRAMINE HCL 50 MG/ML IJ SOLN
INTRAMUSCULAR | Status: AC
  Filled 2018-03-11: qty 1

## 2018-03-11 MED ORDER — SODIUM CHLORIDE 0.9% FLUSH
10.0000 mL | Freq: Once | INTRAVENOUS | Status: AC
  Administered 2018-03-11: 10 mL
  Filled 2018-03-11: qty 10

## 2018-03-11 NOTE — Progress Notes (Signed)
[place]  [phone] [phone]  Clinic Follow up Note   Patient Care Team: [name], MD as PCP - General (Family Medicine) [name], MD as Consulting Physician (General Surgery) [name], MD as Consulting Physician (Hematology) 03/11/2018  SUMMARY OF ONCOLOGIC HISTORY: Oncology History   Cancer Staging Cancer of overlapping sites of right female breast [facility]) Staging form: Breast, AJCC 8th Edition - Clinical stage from 09/23/2017: Stage IIIA (cT3, cN1, cM0, G2, ER+, PR-, HER2-) - Signed by [name], MD on 10/09/2017 - Clinical: cT3 - Unsigned       Cancer of overlapping sites of right female breast ([place])   09/09/2017 Mammogram    IMPRESSION: 1. Highly suspicious large right breast mass involving the outer and upper breast extending from approximately 9 o'clock through 12-1 o'clock, maximum measurement approximating 7.5 cm. The mass involves the right nipple, accounting for nipple retraction. Architectural distortion, microcalcifications and diffuse trabecular thickening and skin thickening are associated with the large mass, raising the possibility of this representing an inflammatory cancer. 2. Satellite masses separate from the dominant mass involving the lower outer quadrant and the upper inner quadrant of the right breast, measured above. 3. Two adjacent pathologic right axillary lymph nodes. 4. Indeterminate 0.9 cm solid mass involving the upper outer quadrant of the left breast which accounts for a mammographic finding. 5. No pathologic left axillary lymphadenopathy.    09/09/2017 Breast US    Targeted right breast ultrasound is performed, showing a very large hypoechoic mass with irregular margins extending from the approximate 9 o'clock position through the 12 to 1 o'clock position. On the CC gait image, the mass measures maximally approximately 7.5 cm. The mass has irregular margins, demonstrates acoustic shadowing, and  demonstrates internal power Doppler flow. The mass extends into the retroareolar region and involves the nipple, accounting for the nipple retraction.  Separate from the dominant mass at the 7 o'clock position approximately 4 cm from nipple is a hypoechoic antiparallel mass with irregular margins measuring approximately 1.4 x 2.7 x 1.7 cm.  Separate from the dominant mass at the 1 o'clock position approximately 2 cm from nipple is a hypoechoic mass with irregular margins measuring approximately 1.7 x 0.5 x 1.6 cm. Both of these masses also demonstrate internal power Doppler flow.  Sonographic evaluation of the right axilla demonstrates 2 adjacent pathologic lymph nodes, the larger measuring approximately 2.5 x 2.5 x 2.8 cm, the smaller measuring approximately 2.0 x 0.8 x 2.7 cm.    09/23/2017 Initial Biopsy    Diagnosis 1. Breast, left, needle core biopsy, 2 o'clock - INVASIVE DUCTAL CARCINOMA. - SEE COMMENT. 2. Breast, right, needle core biopsy, 11:30 o'clock - INVASIVE MAMMARY CARCINOMA. - SEE COMMENT. 3. Lymph node, needle/core biopsy, right axilla - METASTATIC MAMMARY CARCINOMA IN 1 OF 1 LYMPH NODE (1/1). Microscopic Comment 1. There is a small focus of grade I invasive ductal carcinoma. A complete breast prognostic profile will be attempted and the results reported separately. The results were called to the [facility] on 09/24/2017. 2. The carcinoma appears grade II. An E-Cadherin stain and a breast prognostic profile will be performed on part 2 and the results reported separately. (JBK:kh 09-24-17) [name]  1. HER2 Negative by FISH Estrogen receptor: 100% positive, strong staining Progesterone receptor: 80% positive, strong staining Ki67: 5%  2. HER2 Negative by FISH Estrogen receptor: 100% positive, strong staining Progesterone receptor: 0%, negative  Ki67: 30% 2. The tumor cells are strongly positive  for E-cadherin, supporting a ductal phenotype     09/23/2017 Cancer Staging    Staging form: Breast, AJCC 8th Edition - Clinical stage from 09/23/2017: Stage IIIA (cT3, cN1, cM0, G2, ER+, PR-, HER2-) - Signed by [name], MD on 10/09/2017    10/07/2017 Breast MRI    IMPRESSION: 1. Biopsy proven malignancy involving all 4 quadrants of the right breast, although predominantly located in the anterior third of the central upper right breast measures 6.8 x 9.6 x 7.2 cm.  2. Five pathologic right axillary lymph nodes, compatible with biopsy proven axillary metastases.  3. Small mass with associated biopsy related changes in the upper-outer left breast at site of known malignancy measures 0.9 x 0.8 x 0.7 cm. No abnormal lymph nodes seen in the left axilla.  RECOMMENDATION: Treatment plan for known bilateral breast malignancy.  BI-RADS CATEGORY  6: Known biopsy-proven malignancy.    10/08/2017 Initial Diagnosis    Cancer of overlapping sites of right female breast ([place])    10/13/2017 Pathology Results    Diagnosis 1. Breast, right, needle core biopsy, 1 o'clock - INVASIVE DUCTAL CARCINOMA. - SEE COMMENT. 2. Breast, right, needle core biopsy, 7 o'clock - INVASIVE DUCTAL CARCINOMA. - SEE COMMENT. Microscopic Comment 1. and 2. The carcinoma in parts 1 and 2 is morphologically similar and appears grade II. Because breast prognostic profiles were performed on the patients previous case, [id number], it will not be repeated on the current case unless requested.     10/17/2017 Imaging    CT CAP IMPRESSION: 1. Infiltrative right breast mass with overlying skin thickening is identified compatible with known breast cancer. 2. Enlarged right axillary lymph nodes compatible with metastatic adenopathy. 3. No additional sites of disease identified within the chest and no evidence for metastasis to the abdomen or pelvis. 4. Aortic atherosclerosis and 3 vessel coronary artery atherosclerotic calcifications. Aortic Atherosclerosis (ICD10-I70.0).     10/17/2017 Imaging    Bone Scan FINDINGS: There are no foci of increased or decreased radiotracer uptake to suggest osseous metastatic disease. 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 There is degenerative type uptake within both shoulders and the left ankle.  Normal physiologic activity is identified within the kidneys and urinary bladder.  IMPRESSION: No evidence of osseous metastatic disease.    10/22/2017 -  Chemotherapy    AC q2 weeks x4 cycles starting 10/22/17-12/03/17, followed by weekly taxol x12 starting 12/17/17    11/14/2017 Genetic Testing    The genes that were analyzed were the 83 genes on [facility]'s Multi-Cancer panel (ALK, APC, ATM, AXIN2, BAP1, BARD1, BLM, BMPR1A, BRCA1, BRCA2, BRIP1, CASR, CDC73, CDH1, CDK4, CDKN1B, CDKN1C, CDKN2A, CEBPA, CHEK2, CTNNA1, DICER1, DIS3L2, EGFR, EPCAM, FH, FLCN, GATA2, GPC3, GREM1, HOXB13, HRAS, KIT, MAX, MEN1, MET, MITF, MLH1, MSH2, MSH3, MSH6, MUTYH, NBN, NF1, NF2, NTHL1, PALB2, PDGFRA, PHOX2B, PMS2, POLD1, POLE, POT1, PRKAR1A, PTCH1, PTEN, RAD50, RAD51C, RAD51D, RB1, RECQL4, RET, RUNX1, SDHA, SDHAF2, SDHB, SDHC, SDHD, SMAD4, SMARCA4, SMARCB1, SMARCE1, STK11, SUFU, TERC, TERT, TMEM127, TP53, TSC1, TSC2, VHL, WRN, WT1).  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    12/16/2017 Breast US    IMPRESSION: 1. Stable to minimal decrease in size of multiple irregular masses throughout the right breast consistent with the patient's sites of biopsy proven malignancy. While the mammographic appearance is slightly less dense in this  region, there is increased skin and trabecular thickening involving the entire right breast. 2. Stable appearance of morphologically abnormal right axillary lymph nodes, consistent with biopsy proven  metastatic disease. 3. Stable appearance of a left breast mass, consistent with biopsy proven malignancy.      Malignant neoplasm of left breast in female, estrogen receptor positive ([place])   09/09/2017 Breast US    Targeted left breast ultrasound is performed, showing an oval parallel hypoechoic mass containing microcalcifications at the 2 o'clock position approximately 6 cm from nipple measuring approximately 0.9 x 0.5 x 0.9 cm, corresponding to the mammographic finding.  Sonographic evaluation of the left axilla demonstrates no pathologic lymphadenopathy.    09/23/2017 Initial Biopsy    Diagnosis 1. Breast, left, needle core biopsy, 2 o'clock - INVASIVE DUCTAL CARCINOMA. - SEE COMMENT. 2. Breast, right, needle core biopsy, 11:30 o'clock - INVASIVE MAMMARY CARCINOMA. - SEE COMMENT. 3. Lymph node, needle/core biopsy, right axilla - METASTATIC MAMMARY CARCINOMA IN 1 OF 1 LYMPH NODE (1/1). Microscopic Comment 1. There is a small focus of grade I invasive ductal carcinoma. A complete breast prognostic profile will be attempted and the results reported separately. The results were called to the [facility] on 09/24/2017. 2. The carcinoma appears grade II. An E-Cadherin stain and a breast prognostic profile will be performed on part 2 and the results reported separately. (JBK:kh 09-24-17) [name]  1. HER2 Negative by FISH Estrogen receptor: 100% positive, strong staining Progesterone receptor: 80% positive, strong staining Ki67: 5%  2. HER2 Negative by FISH Estrogen receptor: 100% positive, strong staining Progesterone receptor: 0%, negative  Ki67: 30%    10/08/2017 Initial Diagnosis    Malignant neoplasm of left breast in female, estrogen receptor positive ([place])    12/16/2017 Breast US    IMPRESSION: 3. Stable appearance of a left breast mass, consistent with biopsy proven malignancy.     01/22/2018 Imaging    01/22/2018 MRI Bilateral Breast IMPRESSION: 1.  Dominant enhancing mass with surrounding nodularity involving the central anterior third of the right breast now measures up to 6.4 x 6.7 x 7.6 cm (previously measured 6.8 x 9.6 x 7.2 cm). Persistent bulky right axillary lymphadenopathy, slightly decreased in size since prior MRI.  2. Biopsy proven left breast malignancy now measures 0.5 cm (previously measured 0.9 x 0.8 x 0.7 cm).    CURRENT THERAPY : Neoadjuvant chemo AC q2 weeks x4 cycles starting 10/22/17-12/03/17, followed by weekly taxol x12 starting 12/17/17   INTERVAL HISTORY: Ms. Sonnenfeld returns for follow up and last cycle neoadjuvant chemo as scheduled. She completed cycle 11 taxol on 03/04/18. She began norvasc and noticed bilateral lower extremity swelling and swelling in her hands after 4 days. BP remains elevated. She continues to note numbness in the tips of right thumb and first 2 fingers. Her function is not limited. She is down to smoking 3 cigarettes, using nicotine replacement gum. She continues to work. Denies n/v/c/d, fever, chills, cough, chest pain, or dyspnea.    MEDICAL HISTORY:  Past Medical History:  Diagnosis Date  . Adopted   . Arthritis   . breast ca dx'd 08/2017  . Genetic testing 11/14/2017   Multi-Cancer panel (83 genes) @ [facility] - Pathogenic mutation in the MITF gene  . GERD (gastroesophageal reflux disease)   . 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 IR IMAGING GUIDED PORT  INSERTION  10/20/2017  . IR US GUIDE VASC ACCESS LEFT  10/20/2017  . TONSILLECTOMY Bilateral   . TUBAL LIGATION      I have reviewed the social history and family history with the patient and they are unchanged from previous note.  ALLERGIES:  is allergic to bee venom.  MEDICATIONS:  Current Outpatient Medications  Medication Sig Dispense Refill  . amLODipine (NORVASC) 5 MG tablet Take 1  tablet (5 mg total) by mouth daily. 30 tablet 0  . Coenzyme Q10 100 MG capsule Take 100 mg by mouth daily.     [name] lidocaine-prilocaine (EMLA) cream Apply to affected area once 30 g 3  . ondansetron (ZOFRAN) 8 MG tablet Take 1 tablet (8 mg total) by mouth 2 (two) times daily as needed. Start on the third day after chemotherapy. 30 tablet 1  . prochlorperazine (COMPAZINE) 10 MG tablet Take 1 tablet (10 mg total) by mouth every 6 (six) hours as needed (Nausea or vomiting). 60 tablet 1  . vitamin B-12 (CYANOCOBALAMIN) 1000 MCG tablet Take 1,000 mcg by mouth daily.    . vitamin E 100 UNIT capsule Take 100 Units by mouth daily.     No current facility-administered medications for this visit.    Facility-Administered Medications Ordered in Other Visits  Medication Dose Route Frequency Provider Last Rate Last Dose  . heparin lock flush 100 unit/mL  500 Units Intracatheter Once PRN [name], MD      . PACLitaxel (TAXOL) 150 mg in sodium chloride 0.9 % 250 mL chemo infusion (</= 58m/m2)  80 mg/m2 (Treatment Plan Recorded) Intravenous Once FTruitt Merle MD      . sodium chloride flush (NS) 0.9 % injection 10 mL  10 mL Intracatheter PRN FTruitt Merle MD        PHYSICAL EXAMINATION: ECOG PERFORMANCE STATUS: 1 - Symptomatic but completely ambulatory  Vitals:   03/11/18 1334  BP: (!) 157/92  Pulse: 79  Resp: 18  Temp: 98.3 F (36.8 C)  SpO2: 98%   Filed Weights   03/11/18 1334  Weight: 175 lb 6.4 oz (79.6 kg)    GENERAL:alert, no distress and comfortable SKIN:  no rashes or significant lesions EYES: sclera clear OROPHARYNX:no thrush or ulcers LYMPH:  no palpable cervical or supraclavicular lymphadenopathy LUNGS: clear to auscultation with normal breathing effort HEART: regular rate & rhythm, bilateral mild lower extremity edema ABDOMEN:abdomen soft, non-tender and normal bowel sounds Musculoskeletal:no cyanosis of digits and no clubbing  NEURO: alert & oriented x 3 with fluent speech, no focal  motor/sensory deficits Breast exam: right central breast mass measures approx 6x5 cm; no palpable axillary adenopathy or left breast mass  PAC without erythema   LABORATORY DATA:  I have reviewed the data as listed CBC Latest Ref Rng & Units 03/11/2018 03/04/2018 02/25/2018  WBC 3.9 - 10.3 K/uL 6.5 5.5 6.3  Hemoglobin 11.6 - 15.9 g/dL 12.7 12.5 12.0  Hematocrit 34.8 - 46.6 % 37.7 36.9 35.3  Platelets 145 - 400 K/uL 215 211 207     CMP Latest Ref Rng & Units 03/11/2018 03/04/2018 02/25/2018  Glucose 70 - 99 mg/dL 92 97 89  BUN 8 - 23 mg/dL 25(H) 18 20  Creatinine 0.44 - 1.00 mg/dL 0.69 0.67 0.70  Sodium 135 - 145 mmol/L 142 142 143  Potassium 3.5 - 5.1 mmol/L 3.9 3.9 3.7  Chloride 98 - 111 mmol/L 109 107 108  CO2 22 - 32 mmol/L _0 Calcium 8.9 - 10.3 mg/dL 8.8(L)  9.2 9.0  Total Protein 6.5 - 8.1 g/dL 5.9(L) 6.0(L) 6.0(L)  Total Bilirubin 0.3 - 1.2 mg/dL <0.2(L) <0.2(L) <0.2(L)  Alkaline Phos 38 - 126 U/L 50 51 49  AST 15 - 41 U/L _0 ALT 0 - 44 U/L _1 RADIOGRAPHIC STUDIES: I have personally reviewed the radiological images as listed and agreed with the findings in the report. No results found.   ASSESSMENT & PLAN: Melvin Marmo Dixonis a 62 y.o.Caucasian femalewith history of substance usepresented with right nipple inversion for 3 months.    1. 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 Social support 3. Substance use - down to 5 cigarettes, encouraged her to stop completely  4. Insomnia 5. 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 Genetic Testing -The genes that were analyzed were the83 genes on [facility]'s  Multi-Cancer panel: Testingrevealed a pathogenic mutation in the MITF genecalled c.952G>A (p.Glu318Lys) and A Variant of Unknown Significance (VUS) was also detected:POLD1 c.301A>T (p.Ile101Phe).  Ms. Leyendecker appears stable. She completed 4 cycles AC and 11 cycles taxol. She has tolerated neoadjuvant chemo very well overall. She has mild neuropathy in first fingers of right hand. Breast exam is overall stable. CBC and CMP adequate to treat. She will proceed with final cycle 12 taxol today. She will be arranged to have MRI to evaluate her response to therapy in the next week. She will see Dr. Dalbert Batman next week to finalize the surgical plan.   The patient tells me she favors bilateral mastectomy. She has seen [name] and plans to delay reconstruction until after radiation. I briefly reviewed anti-estrogen therapy in the future, she is a candidate given her ER+ breast cancer. Will discuss in more detail later. She can have her PAC removed in surgery. She will return in 2-3 weeks for f/u with [name] prior to surgery, which is anticipated later this month. I provided her with a letter for jury duty excuse. I reviewed the plan with [name].   Lower extremity edema is likely related to amlodipine. She will discontinue this medication. I recommend she watch her BP and reduce salt in her diet. She is down to 3 cigarettes, I strongly encouraged her to quit completely. She is trying.   PLAN: -Labs reviewed, proceed with final cycle 12 neoadjuvant taxol, current dose  -MRI after chemo, ordered today -f/u with Dr. Dalbert Batman next week for final surgical planning -f/u with [name] in 2-3 weeks, prior to surgery  -Jury excuse letter provided  -discontinue amlodipine    Orders Placed This Encounter  Procedures  . MR BREAST BILATERAL W WO CONTRAST INC CAD    Standing Status:   Future    Standing Expiration Date:   05/12/2019    Order Specific Question:   ** REASON FOR EXAM (FREE TEXT)    Answer:    monitor response to neoadjuvant chemo    Order Specific Question:   If indicated for the ordered procedure, I authorize the administration of contrast media per Radiology protocol    Answer:   Yes    Order Specific Question:   What is the patient's sedation requirement?    Answer:  No Sedation    Order Specific Question:   Does the patient have a pacemaker or implanted devices?    Answer:   No    Order Specific Question:   Radiology Contrast Protocol - do NOT remove file path    Answer:   [email or web address]    Order Specific Question:   Preferred imaging location?    Answer:   [facility] (table limit-350 lbs)   All questions were answered. The patient knows to call the clinic with any problems, questions or concerns. No barriers to learning was detected. I spent 20 minutes counseling the patient face to face. The total time spent in the appointment was 25 minutes and more than 50% was on counseling and review of test results     Alla Feeling, NP 03/11/18

## 2018-03-11 NOTE — Patient Instructions (Signed)
[facility] Discharge Instructions for Patients Receiving Chemotherapy  Today you received the following chemotherapy agents Padlitaxel  To help prevent nausea and vomiting after your treatment, we encourage you to take your nausea medication as directed   If you develop nausea and vomiting that is not controlled by your nausea medication, call the clinic.   BELOW ARE SYMPTOMS THAT SHOULD BE REPORTED IMMEDIATELY:  *FEVER GREATER THAN 100.5 F  *CHILLS WITH OR WITHOUT FEVER  NAUSEA AND VOMITING THAT IS NOT CONTROLLED WITH YOUR NAUSEA MEDICATION  *UNUSUAL SHORTNESS OF BREATH  *UNUSUAL BRUISING OR BLEEDING  TENDERNESS IN MOUTH AND THROAT WITH OR WITHOUT PRESENCE OF ULCERS  *URINARY PROBLEMS  *BOWEL PROBLEMS  UNUSUAL RASH Items with * indicate a potential emergency and should be followed up as soon as possible.  Feel free to call the clinic should you have any questions or concerns. The clinic phone number is [phone].  Please show the [place] at check-in to the Emergency Department and triage nurse.

## 2018-03-12 ENCOUNTER — Other Ambulatory Visit: Payer: Medicaid Other

## 2018-03-12 ENCOUNTER — Telehealth: Payer: Self-pay | Admitting: Hematology

## 2018-03-12 ENCOUNTER — Ambulatory Visit: Payer: Medicaid Other

## 2018-03-12 ENCOUNTER — Ambulatory Visit: Payer: Medicaid Other | Admitting: Hematology

## 2018-03-12 NOTE — Telephone Encounter (Signed)
Tried calling patient no VM was set up.  Letter/calendar mailed to patient per 10/2 los

## 2018-03-17 ENCOUNTER — Telehealth: Payer: Self-pay | Admitting: Emergency Medicine

## 2018-03-17 ENCOUNTER — Other Ambulatory Visit: Payer: Self-pay | Admitting: General Surgery

## 2018-03-17 DIAGNOSIS — Z170 Estrogen receptor positive status [ER+]: Principal | ICD-10-CM

## 2018-03-17 DIAGNOSIS — C50911 Malignant neoplasm of unspecified site of right female breast: Secondary | ICD-10-CM

## 2018-03-17 DIAGNOSIS — C50912 Malignant neoplasm of unspecified site of left female breast: Principal | ICD-10-CM

## 2018-03-17 NOTE — Telephone Encounter (Addendum)
Scheduled patiens MRI for 10/11 @ 8am. Unable to reach the patient to give her this info. Called Darlene Beasley office and gave the information to his nurse as the patient will be seen in his office today. They will inform her of MRI date.   ----- Message from Darlene Feeling, NP sent at 03/13/2018  4:01 PM EDT ----- Darlene,  Can you please help to schedule asap, she will see Dr. Dalbert Beasley 10/8 having the imaging would be very helpful to finalize surgery plan, but I understand very short notice.   Thanks all, Darlene Beasley   ----- Message ----- From: Darlene Beasley Sent: 03/13/2018   1:29 PM EDT To: Darlene Beasley, Darlene Feeling, NP, #  Breast MRI approved ----- Message ----- From: Darlene Feeling, NP Sent: 03/13/2018  10:56 AM EDT To: Darlene Beasley, Darlene Closs, Darlene Beasley, #  [name], can you update me on the status of MRI pre auth? Trying to schedule asap.  Thanks, Darlene Beasley   ----- Message ----- From: Darlene Beasley Sent: 03/11/2018   2:44 PM EDT To: Darlene Feeling, NP, Darlene Closs, Darlene Beasley  Yes ----- Message ----- From: Darlene Feeling, NP Sent: 03/11/2018   2:20 PM EDT To: Darlene Mins, Darlene Beasley  [name], she has medicaid. Does she need pre-auth for breast MRI?   Darlene Beasley, can you please help to get this scheduled asap, hopefully before her visit with Dr. Dalbert Beasley on 10/8.   Thanks, Darlene Beasley

## 2018-03-19 ENCOUNTER — Other Ambulatory Visit: Payer: Medicaid Other

## 2018-03-19 ENCOUNTER — Ambulatory Visit: Payer: Medicaid Other

## 2018-03-20 ENCOUNTER — Ambulatory Visit (HOSPITAL_COMMUNITY)
Admission: RE | Admit: 2018-03-20 | Discharge: 2018-03-20 | Disposition: A | Discharge status: Home / Self Care | Payer: Medicaid Other | Source: Ambulatory Visit | Attending: Nurse Practitioner | Admitting: Nurse Practitioner

## 2018-03-20 DIAGNOSIS — D493 Neoplasm of unspecified behavior of breast: Secondary | ICD-10-CM | POA: Diagnosis present

## 2018-03-20 DIAGNOSIS — C50412 Malignant neoplasm of upper-outer quadrant of left female breast: Secondary | ICD-10-CM | POA: Insufficient documentation

## 2018-03-20 DIAGNOSIS — C50911 Malignant neoplasm of unspecified site of right female breast: Secondary | ICD-10-CM | POA: Insufficient documentation

## 2018-03-20 MED ORDER — GADOBUTROL 1 MMOL/ML IV SOLN
8.0000 mL | Freq: Once | INTRAVENOUS | Status: AC | PRN
  Administered 2018-03-20: 8 mL via INTRAVENOUS

## 2018-03-24 ENCOUNTER — Other Ambulatory Visit: Payer: Self-pay | Admitting: General Surgery

## 2018-03-24 DIAGNOSIS — Z170 Estrogen receptor positive status [ER+]: Principal | ICD-10-CM

## 2018-03-24 DIAGNOSIS — C50911 Malignant neoplasm of unspecified site of right female breast: Secondary | ICD-10-CM

## 2018-03-24 DIAGNOSIS — C50912 Malignant neoplasm of unspecified site of left female breast: Principal | ICD-10-CM

## 2018-03-25 NOTE — Progress Notes (Signed)
[name]  [phone] [phone]  Clinic Follow Up Note   Patient Care Team: Tower, Wynelle Fanny, MD as PCP - General (Family Medicine) [name], MD as Consulting Physician (General Surgery) [name], MD as Consulting Physician (Hematology)   Date of Service:  03/27/2018  CHIEF COMPLAINTS:  Follow up for Bilateral Breast Cancer   Oncology History   Cancer Staging Cancer of overlapping sites of right female breast [facility]) Staging form: Breast, AJCC 8th Edition - Clinical stage from 09/23/2017: Stage IIIA (cT3, cN1, cM0, G2, ER+, PR-, HER2-) - Signed by [name], MD on 10/09/2017 - Clinical: cT3 - Unsigned       Cancer of overlapping sites of right female breast ([place])   09/09/2017 Mammogram    IMPRESSION: 1. Highly suspicious large right breast mass involving the outer and upper breast extending from approximately 9 o'clock through 12-1 o'clock, maximum measurement approximating 7.5 cm. The mass involves the right nipple, accounting for nipple retraction. Architectural distortion, microcalcifications and diffuse trabecular thickening and skin thickening are associated with the large mass, raising the possibility of this representing an inflammatory cancer. 2. Satellite masses separate from the dominant mass involving the lower outer quadrant and the upper inner quadrant of the right breast, measured above. 3. Two adjacent pathologic right axillary lymph nodes. 4. Indeterminate 0.9 cm solid mass involving the upper outer quadrant of the left breast which accounts for a mammographic finding. 5. No pathologic left axillary lymphadenopathy.    09/09/2017 Breast US    Targeted right breast ultrasound is performed, showing a very large hypoechoic mass with irregular margins extending from the approximate 9 o'clock position through the 12 to 1 o'clock position. On the CC gait image, the mass measures maximally approximately 7.5 cm. The mass has  irregular margins, demonstrates acoustic shadowing, and demonstrates internal power Doppler flow. The mass extends into the retroareolar region and involves the nipple, accounting for the nipple retraction.  Separate from the dominant mass at the 7 o'clock position approximately 4 cm from nipple is a hypoechoic antiparallel mass with irregular margins measuring approximately 1.4 x 2.7 x 1.7 cm.  Separate from the dominant mass at the 1 o'clock position approximately 2 cm from nipple is a hypoechoic mass with irregular margins measuring approximately 1.7 x 0.5 x 1.6 cm. Both of these masses also demonstrate internal power Doppler flow.  Sonographic evaluation of the right axilla demonstrates 2 adjacent pathologic lymph nodes, the larger measuring approximately 2.5 x 2.5 x 2.8 cm, the smaller measuring approximately 2.0 x 0.8 x 2.7 cm.    09/23/2017 Initial Biopsy    Diagnosis 1. Breast, left, needle core biopsy, 2 o'clock - INVASIVE DUCTAL CARCINOMA. - SEE COMMENT. 2. Breast, right, needle core biopsy, 11:30 o'clock - INVASIVE MAMMARY CARCINOMA. - SEE COMMENT. 3. Lymph node, needle/core biopsy, right axilla - METASTATIC MAMMARY CARCINOMA IN 1 OF 1 LYMPH NODE (1/1). Microscopic Comment 1. There is a small focus of grade I invasive ductal carcinoma. A complete breast prognostic profile will be attempted and the results reported separately. The results were called to the [facility] on 09/24/2017. 2. The carcinoma appears grade II. An E-Cadherin stain and a breast prognostic profile will be performed on part 2 and the results reported separately. (JBK:kh 09-24-17) Darlene Beasley  1. HER2 Negative by FISH Estrogen receptor: 100% positive, strong staining Progesterone receptor: 80% positive, strong staining Ki67: 5%  2. HER2 Negative by FISH Estrogen receptor: 100% positive, strong staining Progesterone  receptor: 0%, negative  Ki67: 30% 2. The tumor cells are strongly positive  for E-cadherin, supporting a ductal phenotype    09/23/2017 Cancer Staging    Staging form: Breast, AJCC 8th Edition - Clinical stage from 09/23/2017: Stage IIIA (cT3, cN1, cM0, G2, ER+, PR-, HER2-) - Signed by [name], MD on 10/09/2017    10/07/2017 Breast MRI    IMPRESSION: 1. Biopsy proven malignancy involving all 4 quadrants of the right breast, although predominantly located in the anterior third of the central upper right breast measures 6.8 x 9.6 x 7.2 cm.  2. Five pathologic right axillary lymph nodes, compatible with biopsy proven axillary metastases.  3. Small mass with associated biopsy related changes in the upper-outer left breast at site of known malignancy measures 0.9 x 0.8 x 0.7 cm. No abnormal lymph nodes seen in the left axilla.  RECOMMENDATION: Treatment plan for known bilateral breast malignancy.  BI-RADS CATEGORY  6: Known biopsy-proven malignancy.    10/08/2017 Initial Diagnosis    Cancer of overlapping sites of right female breast ([place])    10/13/2017 Pathology Results    Diagnosis 1. Breast, right, needle core biopsy, 1 o'clock - INVASIVE DUCTAL CARCINOMA. - SEE COMMENT. 2. Breast, right, needle core biopsy, 7 o'clock - INVASIVE DUCTAL CARCINOMA. - SEE COMMENT. Microscopic Comment 1. and 2. The carcinoma in parts 1 and 2 is morphologically similar and appears grade II. Because breast prognostic profiles were performed on the patients previous case, [id number], it will not be repeated on the current case unless requested.     10/17/2017 Imaging    CT CAP IMPRESSION: 1. Infiltrative right breast mass with overlying skin thickening is identified compatible with known breast cancer. 2. Enlarged right axillary lymph nodes compatible with metastatic adenopathy. 3. No additional sites of disease identified within the chest and no evidence for metastasis to the abdomen or pelvis. 4. Aortic atherosclerosis and 3 vessel coronary artery atherosclerotic  calcifications. Aortic Atherosclerosis (ICD10-I70.0).    10/17/2017 Imaging    Bone Scan FINDINGS: There are no foci of increased or decreased radiotracer uptake to suggest osseous metastatic disease. 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 There is degenerative type uptake within both shoulders and the left ankle.  Normal physiologic activity is identified within the kidneys and urinary bladder.  IMPRESSION: No evidence of osseous metastatic disease.    10/22/2017 - 03/11/2018 Chemotherapy    AC q2 weeks x4 cycles starting 10/22/17-12/03/17, followed by weekly taxol x12 12/17/17-03/11/18    11/14/2017 Genetic Testing    The genes that were analyzed were the 83 genes on [facility]'s Multi-Cancer panel (ALK, APC, ATM, AXIN2, BAP1, BARD1, BLM, BMPR1A, BRCA1, BRCA2, BRIP1, CASR, CDC73, CDH1, CDK4, CDKN1B, CDKN1C, CDKN2A, CEBPA, CHEK2, CTNNA1, DICER1, DIS3L2, EGFR, EPCAM, FH, FLCN, GATA2, GPC3, GREM1, HOXB13, HRAS, KIT, MAX, MEN1, MET, MITF, MLH1, MSH2, MSH3, MSH6, MUTYH, NBN, NF1, NF2, NTHL1, PALB2, PDGFRA, PHOX2B, PMS2, POLD1, POLE, POT1, PRKAR1A, PTCH1, PTEN, RAD50, RAD51C, RAD51D, RB1, RECQL4, RET, RUNX1, SDHA, SDHAF2, SDHB, SDHC, SDHD, SMAD4, SMARCA4, SMARCB1, SMARCE1, STK11, SUFU, TERC, TERT, TMEM127, TP53, TSC1, TSC2, VHL, WRN, WT1).  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    12/16/2017 Breast US    IMPRESSION: 1. Stable to minimal decrease in size of multiple irregular masses throughout the right breast consistent with the patient's sites of biopsy  proven malignancy. While the mammographic appearance is slightly less dense in this region, there is increased skin and trabecular thickening involving the entire right breast. 2. Stable appearance of morphologically  abnormal right axillary lymph nodes, consistent with biopsy proven metastatic disease. 3. Stable appearance of a left breast mass, consistent with biopsy proven malignancy.      Malignant neoplasm of left breast in female, estrogen receptor positive ([place])   09/09/2017 Breast US    Targeted left breast ultrasound is performed, showing an oval parallel hypoechoic mass containing microcalcifications at the 2 o'clock position approximately 6 cm from nipple measuring approximately 0.9 x 0.5 x 0.9 cm, corresponding to the mammographic finding.  Sonographic evaluation of the left axilla demonstrates no pathologic lymphadenopathy.    09/23/2017 Initial Biopsy    Diagnosis 1. Breast, left, needle core biopsy, 2 o'clock - INVASIVE DUCTAL CARCINOMA. - SEE COMMENT. 2. Breast, right, needle core biopsy, 11:30 o'clock - INVASIVE MAMMARY CARCINOMA. - SEE COMMENT. 3. Lymph node, needle/core biopsy, right axilla - METASTATIC MAMMARY CARCINOMA IN 1 OF 1 LYMPH NODE (1/1). Microscopic Comment 1. There is a small focus of grade I invasive ductal carcinoma. A complete breast prognostic profile will be attempted and the results reported separately. The results were called to the [facility] on 09/24/2017. 2. The carcinoma appears grade II. An E-Cadherin stain and a breast prognostic profile will be performed on part 2 and the results reported separately. (JBK:kh 09-24-17) Darlene Beasley  1. HER2 Negative by FISH Estrogen receptor: 100% positive, strong staining Progesterone receptor: 80% positive, strong staining Ki67: 5%  2. HER2 Negative by FISH Estrogen receptor: 100% positive, strong staining Progesterone receptor: 0%, negative  Ki67: 30%    10/08/2017 Initial Diagnosis    Malignant neoplasm of left breast in female, estrogen receptor positive ([place])    12/16/2017 Breast US    IMPRESSION: 3. Stable appearance of a left breast mass, consistent with biopsy proven malignancy.      01/22/2018 Imaging    01/22/2018 MRI Bilateral Breast IMPRESSION: 1. Dominant enhancing mass with surrounding nodularity involving the central anterior third of the right breast now measures up to 6.4 x 6.7 x 7.6 cm (previously measured 6.8 x 9.6 x 7.2 cm). Persistent bulky right axillary lymphadenopathy, slightly decreased in size since prior MRI.  2. Biopsy proven left breast malignancy now measures 0.5 cm (previously measured 0.9 x 0.8 x 0.7 cm).      HISTORY OF PRESENTING ILLNESS:   Darlene Beasley 62 y.o. female is here because of newly diagnosed bilateral breast cancer. In 03/2017 she "pinched" herself at work and developed a right breast bruise and soreness which finally resolved in 04/2017. In January 2019 she noticed right nipple inversion. She researched online and made plans to have a mammogram, last done 30 years ago. She had a palpable breast 'lump" which she felt after her son was born 80 years ago that was never biopsied and eventually resolved. She reports intentional weight loss.   Imaging revealed a large right breast mass involving the upper outer quadrant extending from 9 o'clock to 12-1 o'clock, measuring approximately 7.5 cm, involving the right nipple. There is a satellite lesion at the 7 o'clock position approximately 4 cm from the nipple with irregular margins measuring 1.4 x 2.7, x 1.7 cm. Additional satellite lesion at 1 o'clock position approximately 2 cm from the nipple measures 1.7 x 0.5 x 1.6 cm. [place] of right axilla reveals 2 adjacent pathologic lymph nodes, measuring 2.5  x 2.5 x 2.8 cm and 2.0 x 0.8 x 2.7 cm. Biopsy of the right breast revealed invasive mammary carcinoma, tumor cells are strongly positive for E-cadherin, supporting a ductal phenotype; grade II, HER2 negative, ER positive, PR negative, metastatic to right axilla. Targeted left breast US reveals hypoechoic mass at the 2 o'clock position approximately 6 cm from the nipple measuring 0.9 x 0.5 x 0.9 fm.  No suspicious adenopathy in the left axilla. Biopsy of the left breast revealed invasive ductal carcinoma, grade I, HER2 negative, ER/PR positive. Breast MRI revealed biopsy-proven malignancy involving all 4 quadrants of the right breast, predominantly in the anterior third of the central upper right breast measuring 6.8 x 9.6 x 7.2 cm with 5 pathologic right axillary lymph nodes, compatible with biopsy-proven axillary metastases. In the left breast there is a small mass with associated biopsy related changes in the upper outer left breast at site of known malignancy, no abnormal axillary lymph nodes.   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 Had GERD in the past. She has smoked 1-2 PPD cigarettes x51 years, currently weaning. She quit drinking alcohol when she changed her diet and began losing weight in 08/2017. She has a significant other with whom she resides. She is independent of all ADLs. She is adopted, family history unknown.  Today she feels well, she has lingering cough from recent URI. No fever or chills. She is fatigued at baseline due to busy job, she works 14-hour days, 60 hours per week, working at [employer] in [place]. She tolerated breast biopsy well with mild bruising and swelling.   GYN HISTORY  Menarchal: age 5 LMP: age [age] Contraceptive: age [age] OCP HRT: None G1P1   CURRENT THERAPY : PENDING surgery   INTERVAL HISTORY   Valary Manahan is here for a follow up post neoadjuvant chemotherapy. She presents to the clinic today by herself. She notes she is tired due to working a lot of hours lately.  She notes since being put on Amlodipine recently she has moderate swelling of b/l lower legs. She was instructed to stop 2 weeks ago especially because this was not lowering her BP. She notes upper leg swelling when she sits down. She notes massaging her feet with alcohol help  reduce of swelling but return in the morning. She works in [employer] daily so she is mostly on her feet.     MEDICAL HISTORY:  Past Medical History:  Diagnosis Date  . Adopted   . Arthritis   . breast ca dx'd 08/2017  . Genetic testing 11/14/2017   Multi-Cancer panel (83 genes) @ [facility] - Pathogenic mutation in the MITF gene  . GERD (gastroesophageal reflux disease)   . 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 IR IMAGING GUIDED PORT INSERTION  10/20/2017  . IR US GUIDE VASC ACCESS LEFT  10/20/2017  . TONSILLECTOMY Bilateral   . TUBAL LIGATION      SOCIAL HISTORY: Social History   Socioeconomic History  . Marital status: Significant Other    Spouse name: Not on file  . Number of children: 1  . Years of education: Not on file  . Highest education level: Not on file  Occupational History  . Not on file  Social Needs  . Financial  resource strain: Not on file  . Food insecurity:    Worry: Not on file    Inability: Not on file  . Transportation needs:    Medical: Not on file    Non-medical: Not on file  Tobacco Use  . Smoking status: Current Every Day Smoker    Packs/day: 0.50    Years: 51.00    Pack years: 25.50    Types: Cigarettes  . Smokeless tobacco: Never Used  . Tobacco comment: Working on quitting.  Substance and Sexual Activity  . Alcohol use: No    Comment: stopped 08/2017   . Drug use: Yes    Types: Marijuana    Comment: daily, since age 1   . Sexual activity: Not Currently    Birth control/protection: Sponge, Surgical  Lifestyle  . Physical activity:    Days per week: 7 days    Minutes per session: 80 min  . Stress: Very much  Relationships  . Social connections:    Talks on phone: Never    Gets together: Never    Attends religious service: Never    Active member of club or organization: No    Attends  meetings of clubs or organizations: Never    Relationship status: Living with partner  . Intimate partner violence:    Fear of current or ex partner: No    Emotionally abused: No    Physically abused: No    Forced sexual activity: No  Other Topics Concern  . Not on file  Social History Narrative  . Not on file    FAMILY HISTORY: Family History  Adopted: Yes  Problem Relation Age of Onset  . Congestive Heart Failure Mother   . Congestive Heart Failure Father     ALLERGIES:  is allergic to bee venom.  MEDICATIONS:  Current Outpatient Medications  Medication Sig Dispense Refill  . Coenzyme Q10 100 MG capsule Take 100 mg by mouth daily.     [name] lidocaine-prilocaine (EMLA) cream Apply to affected area once 30 g 3  . ondansetron (ZOFRAN) 8 MG tablet Take 1 tablet (8 mg total) by mouth 2 (two) times daily as needed. Start on the third day after chemotherapy. 30 tablet 1  . prochlorperazine (COMPAZINE) 10 MG tablet Take 1 tablet (10 mg total) by mouth every 6 (six) hours as needed (Nausea or vomiting). 60 tablet 1  . vitamin B-12 (CYANOCOBALAMIN) 1000 MCG tablet Take 1,000 mcg by mouth daily.    . vitamin E 100 UNIT capsule Take 100 Units by mouth daily.     No current facility-administered medications for this visit.     REVIEW OF SYSTEMS:   Constitutional: Denies fevers, chills or abnormal night sweats (+) tiredness, fatigue  Eyes: Denies blurriness of vision, double vision or watery eyes Ears, nose, mouth, throat, and face: Denies mucositis or sore throat Respiratory: Denies cough, dyspnea or wheezes Cardiovascular: Denies palpitation, chest discomfort (+) b/l lower extremity swelling  Gastrointestinal:  Denies nausea, heartburn   Skin: Denies abnormal skin rashes   Breast: (+) Reduced right nipple inversion and softer mass Lymphatics: Denies new lymphadenopathy or easy bruising Neurological:Denies new weaknesses (+) very mild and tolerable neuropathy  Behavioral/Psych: Mood  is stable, no new changes  All other systems were reviewed with the patient and are negative.  PHYSICAL EXAMINATION:  ECOG PERFORMANCE STATUS: 0 - Asymptomatic  Vitals:   03/27/18 1047  BP: (!) 159/87  Pulse: 84  Resp: 17  Temp: 98.4 F (36.9 C)  SpO2:  98%   Filed Weights   03/27/18 1047  Weight: 179 lb 11.2 oz (81.5 kg)    GENERAL:alert, no distress and comfortable SKIN: skin color, texture, turgor are normal, no rashes or significant lesions EYES: normal, conjunctiva are pink and non-injected, sclera clear OROPHARYNX:no exudate, no erythema and lips, buccal mucosa, and tongue normal  NECK: supple, thyroid normal size, non-tender, without nodularity LYMPH:  no palpable lymphadenopathy in the cervical, axillary or inguinal LUNGS: clear to auscultation and percussion with normal breathing effort HEART: regular rate & rhythm and no murmurs (+) b/l lower leg pitting edema  ABDOMEN:abdomen soft, non-tender and normal bowel sounds Musculoskeletal:no cyanosis of digits and no clubbing  PSYCH: alert & oriented x 3 with fluent speech NEURO: no focal motor/sensory deficits BREAST: Skin hyperpigmentation of right nipple, 7x5 cm mass in center of right breast, now softer. She previously had a 1 x 1.5 cm mobile nodule to her right axilla, not palpable anymore. No palpable mass in left breast. Mild skin edema. No ulcers, bleeding, or discharge.    LABORATORY DATA:  I have reviewed the data as listed CBC Latest Ref Rng & Units 03/27/2018 03/11/2018 03/04/2018  WBC 4.0 - 10.5 K/uL 6.9 6.5 5.5  Hemoglobin 12.0 - 15.0 g/dL 12.4 12.7 12.5  Hematocrit 36.0 - 46.0 % 37.0 37.7 36.9  Platelets 150 - 400 K/uL 206 215 211    CMP Latest Ref Rng & Units 03/27/2018 03/11/2018 03/04/2018  Glucose 70 - 99 mg/dL 92 92 97  BUN 8 - 23 mg/dL 19 25(H) 18  Creatinine 0.44 - 1.00 mg/dL 0.72 0.69 0.67  Sodium 135 - 145 mmol/L 144 142 142  Potassium 3.5 - 5.1 mmol/L 4.1 3.9 3.9  Chloride 98 - 111 mmol/L 110  109 107  CO2 22 - 32 mmol/L _0 Calcium 8.9 - 10.3 mg/dL 8.8(L) 8.8(L) 9.2  Total Protein 6.5 - 8.1 g/dL 5.9(L) 5.9(L) 6.0(L)  Total Bilirubin 0.3 - 1.2 mg/dL 0.3 <0.2(L) <0.2(L)  Alkaline Phos 38 - 126 U/L 56 50 51  AST 15 - 41 U/L _1 ALT 0 - 44 U/L _2 Tumor Markers CA 27.29 Results for SHIRRELL, SOLINGER [name] "[name]" (MRN [id number]) as of 03/25/2018 19:43  Ref. Range 12/03/2017 09:35 12/31/2017 11:44 01/21/2018 12:22 02/25/2018 13:26  CA 27.29 Latest Ref Range: 0.0 - 38.6 U/mL 61.4 (H) 43.9 (H) 23.1 19.8    PATHOLOGY  11/07/2017 Molecular Pathology   Diagnosis 10/13/17 1. Breast, right, needle core biopsy, 1 o'clock - INVASIVE DUCTAL CARCINOMA. - SEE COMMENT. 2. Breast, right, needle core biopsy, 7 o'clock - INVASIVE DUCTAL CARCINOMA. - SEE COMMENT. Microscopic Comment 1. and 2. The carcinoma in parts 1 and 2 is morphologically similar and appears grade II. Because breast prognostic profiles were performed on the patients previous case, [id number], it will not be repeated on the current case unless requested.   Diagnosis 09/23/17 1. Breast, left, needle core biopsy, 2 o'clock - INVASIVE DUCTAL CARCINOMA. - SEE COMMENT. 2. Breast, right, needle core biopsy, 11:30 o'clock - INVASIVE MAMMARY CARCINOMA. - SEE COMMENT. 3. Lymph node, needle/core biopsy, right axilla - METASTATIC MAMMARY CARCINOMA IN 1 OF 1 LYMPH NODE (1/1). Microscopic Comment 1. There is a small focus of grade I invasive ductal carcinoma. A complete breast prognostic profile will be attempted and the results reported separately. The results were called to the [facility] on 09/24/2017. 2. The carcinoma appears grade II. An E-Cadherin stain and a  breast prognostic profile will be performed on part 2 and the results reported separately. (JBK:kh 09-24-17) Darlene Beasley 1. HER2 Negative by FISH Estrogen receptor: 100% positive, strong staining Progesterone receptor: 80% positive, strong  staining Ki67: 5% 2. HER2 Negative by FISH Estrogen receptor: 100% positive, strong staining Progesterone receptor: 0%, negative  Ki67: 30% 2. The tumor cells are strongly positive for E-cadherin, supporting a ductal phenotype   RADIOGRAPHIC STUDIES: I have personally reviewed the radiological images as listed and agreed with the findings in the report.  01/22/2018 MRI Bilateral Breast IMPRESSION: 1. Dominant enhancing mass with surrounding nodularity involving the central anterior third of the right breast now measures up to 6.4 x 6.7 x 7.6 cm (previously measured 6.8 x 9.6 x 7.2 cm). Persistent bulky right axillary lymphadenopathy, slightly decreased in size since prior MRI.  2. Biopsy proven left breast malignancy now measures 0.5 cm (previously measured 0.9 x 0.8 x 0.7 cm).    ASSESSMENT & PLAN:  Darlene Beasley is a 62 y.o. Caucasian female with history of substance use presented with right nipple inversion for 3 months.    1. 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 She has locally advanced right breast cancer with a dominant 9.6 cm mass involving all 4 quadrants of the breast involving 5 pathologic axillary lymph nodes. She has a small left side breast cancer as well. She saw Dr. Dalbert Batman who discussed surgical options, including right mastectomy and possible left breast conserving surgery with subsequent radiation. She also met with Dr. Lisbeth Renshaw. I previously discussed the advantages of neoadjuvant chemotherapy and recommends AC x4 followed by weekly taxol x12 cycles. She is active and  relatively healthy, she appears to be a good candidate for chemotherapy  -Her baseline echo from 10/15/17 shows grade 1 diastolic dysfunction, EF 16-10%, GLS -21% -She completed AC every 2 weeks 10/22/17-12/03/17. She tolerated well with mild bone pain from neulasta, mucositosis and intermittent fatigue.  -Her clinical breast exam and 12/16/17 interim mammo and US reveal stable to minimal decrease in size of multiple irregular masses in the right breast, stable right axillary lymph nodes, and stable left breast mass. Her disease does not appear to be very sensitive to chemotherapy. -She completed weekly taxol for 12 cycles, 12/17/17-03/11/18 and is tolerated very well with no significant side effects. She previously experienced sweating during infusion. Will monitor.  -Her repeated breast MRI on January 22, 2018 showed slightly decreased size of her right breast tumor. Her tumor marker has came down to normal also, she is clinically tolerating chemotherapy very well, I recommend her to continue and complete as planned. -Her post neoadjuvant chemotherapy breast MRI showed improvement in the overall size of right breast cancer, no significant change in the right axillary adenopathy, and small left breast mass. -Plans to undergo right mastectomy+ALND, and left breast Lumpectomy and SLNB on 04/03/18 with Dr. Dalbert Batman and proceed with radiation afterward.  -She has seen [name] and plans to have reconstruction after radiation.  -We discussed there is no role for adjuvant chemotherapy, port will be removed during the breast surgery. -At next visit I will discuss her antiestrogen options,  and clinical trial of a CDK4/6 Inhibitor ([facility]).  -Labs reviewed, overall WNL except Ca at 8.8, protein 5.9.  -F/u with me at end of radiation  2. Social support -She works 60 hours per week at her physically demanding job. I informed her she may need to take time off during her intensive chemotherapy schedule. She  plans to apply for medicaid. I previously referred her to social work for ongoing f/u.  -She has met with our financial office, she is working on Engineer, mining.   3. Substance use -She has been smoking cigarettes and marijuana for decades. I encouraged her to quit completely.  -She mentioned interest in herbal and holistic diet, I previously recommended that she avoid herbal supplements while on chemotherapy and endocrine therapy due to potential drug interactions, she understands.  -She now smokes 3 cigarettes a day. I previously offered her the nicotine patch and chantix to which the patient declines this at this time.  -I previously encouraged her to quit completely  -She quit smoking on 02/10/18 and said that she will encourage her partner to quit too.   4. Insomnia -She works long hours and has interrupted sleep.  -I previously recommended benadryl and or melatonin OTC first. She agreed.   5.  Hypertension  -Her BP has been elevated during her visits. She has not checked at home. I advise her to reduce salt intake, increase water intake and if BP high in future I will start her on hypertension medication.  -I previously discussed with the patient to continue with decreasing sodium intake as well as continue with exercise to aid with elevated blood pressure readings.  -I advise her to f/u with PCP  -NP Lacie prescribed her Amlodipine on 02/25/18, this was stopped first week of 03/2018 due to b/l leg swelling and no change in BP.  -Given her legs have remained swollen for about 2 weeks, I will get doppler to rule out blood clot.    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 I encouraged her to use ice bag for her hands and feet during chemo infusion. -Not mentioned in today's visit, likely improving since completing chemotherapy.   7. 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  -She is at high risk for skin melanoma and kidney cancer.  Strongly encouraged her follow-up with a dermatologist. -I encouraged her to avoid too much direct sunlight and use stronger SPF Suncreen given her MITF gene mutation.   8. B/L LE edema  -Developed bilateral lower extremity edema since she started amlodipine about a month ago, however it has not resolved since she stopped amlodipine 2 weeks ago. -Left lower extremity is slightly more swollen than left, no calf tenderness, I will order a stat Doppler to rule out DVT, due to her risk for thrombosis.  PLAN: -B/l doppler of lower extremities doppler (it was done in the afternoon and was negative, pt is aware) -She will undergo bilateral breast surgery and adjuvant radiation  -F/u at end of radiation, she will call me to schedule    All questions were answered. The patient knows to call the clinic with any problems, questions or concerns.  I spent 20 minutes counseling the  patient face to face. The total time spent in the appointment was 25 minutes and more than 50% was on counseling.  Oneal Deputy, am acting as scribe for [name], MD.   I have reviewed the above documentation for accuracy and completeness, and I agree with the above.      [name], MD 03/27/2018

## 2018-03-26 ENCOUNTER — Ambulatory Visit: Payer: Medicaid Other

## 2018-03-26 ENCOUNTER — Ambulatory Visit: Payer: Medicaid Other | Admitting: Hematology

## 2018-03-26 ENCOUNTER — Encounter: Payer: Self-pay | Admitting: Radiation Oncology

## 2018-03-26 ENCOUNTER — Other Ambulatory Visit: Payer: Medicaid Other

## 2018-03-27 ENCOUNTER — Encounter (HOSPITAL_BASED_OUTPATIENT_CLINIC_OR_DEPARTMENT_OTHER): Payer: Self-pay | Admitting: *Deleted

## 2018-03-27 ENCOUNTER — Other Ambulatory Visit: Payer: Self-pay

## 2018-03-27 ENCOUNTER — Ambulatory Visit (HOSPITAL_COMMUNITY)
Admission: RE | Admit: 2018-03-27 | Discharge: 2018-03-27 | Disposition: A | Discharge status: Home / Self Care | Payer: Medicaid Other | Source: Ambulatory Visit | Attending: Hematology | Admitting: Hematology

## 2018-03-27 ENCOUNTER — Inpatient Hospital Stay (HOSPITAL_BASED_OUTPATIENT_CLINIC_OR_DEPARTMENT_OTHER): Payer: Medicaid Other | Admitting: Hematology

## 2018-03-27 ENCOUNTER — Inpatient Hospital Stay: Payer: Medicaid Other

## 2018-03-27 ENCOUNTER — Encounter: Payer: Self-pay | Admitting: Hematology

## 2018-03-27 VITALS — BP 159/87 | HR 84 | Temp 98.4°F | Resp 17 | Ht 64.0 in | Wt 179.7 lb

## 2018-03-27 DIAGNOSIS — I10 Essential (primary) hypertension: Secondary | ICD-10-CM

## 2018-03-27 DIAGNOSIS — G620 Drug-induced polyneuropathy: Secondary | ICD-10-CM | POA: Diagnosis not present

## 2018-03-27 DIAGNOSIS — Z95828 Presence of other vascular implants and grafts: Secondary | ICD-10-CM

## 2018-03-27 DIAGNOSIS — Z79899 Other long term (current) drug therapy: Secondary | ICD-10-CM

## 2018-03-27 DIAGNOSIS — C50811 Malignant neoplasm of overlapping sites of right female breast: Secondary | ICD-10-CM

## 2018-03-27 DIAGNOSIS — Z9013 Acquired absence of bilateral breasts and nipples: Secondary | ICD-10-CM

## 2018-03-27 DIAGNOSIS — Z170 Estrogen receptor positive status [ER+]: Principal | ICD-10-CM

## 2018-03-27 DIAGNOSIS — R600 Localized edema: Secondary | ICD-10-CM

## 2018-03-27 DIAGNOSIS — F17210 Nicotine dependence, cigarettes, uncomplicated: Secondary | ICD-10-CM

## 2018-03-27 DIAGNOSIS — C50412 Malignant neoplasm of upper-outer quadrant of left female breast: Secondary | ICD-10-CM

## 2018-03-27 DIAGNOSIS — Z5111 Encounter for antineoplastic chemotherapy: Secondary | ICD-10-CM | POA: Diagnosis not present

## 2018-03-27 DIAGNOSIS — G4700 Insomnia, unspecified: Secondary | ICD-10-CM

## 2018-03-27 LAB — CBC WITH DIFFERENTIAL (CANCER CENTER ONLY)
ABS IMMATURE GRANULOCYTES: 0.02 10*3/uL (ref 0.00–0.07)
BASOS ABS: 0.1 10*3/uL (ref 0.0–0.1)
Basophils Relative: 1 %
Eosinophils Absolute: 0.1 10*3/uL (ref 0.0–0.5)
Eosinophils Relative: 2 %
HCT: 37 % (ref 36.0–46.0)
Hemoglobin: 12.4 g/dL (ref 12.0–15.0)
IMMATURE GRANULOCYTES: 0 %
Lymphocytes Relative: 21 %
Lymphs Abs: 1.4 10*3/uL (ref 0.7–4.0)
MCH: 32.5 pg (ref 26.0–34.0)
MCHC: 33.5 g/dL (ref 30.0–36.0)
MCV: 97.1 fL (ref 80.0–100.0)
MONOS PCT: 6 %
Monocytes Absolute: 0.4 10*3/uL (ref 0.1–1.0)
NEUTROS ABS: 4.9 10*3/uL (ref 1.7–7.7)
NEUTROS PCT: 70 %
NRBC: 0 % (ref 0.0–0.2)
PLATELETS: 206 10*3/uL (ref 150–400)
RBC: 3.81 MIL/uL — AB (ref 3.87–5.11)
RDW: 14.3 % (ref 11.5–15.5)
WBC: 6.9 10*3/uL (ref 4.0–10.5)

## 2018-03-27 LAB — CMP (CANCER CENTER ONLY)
ALBUMIN: 3.4 g/dL — AB (ref 3.5–5.0)
ALT: 16 U/L (ref 0–44)
ANION GAP: 8 (ref 5–15)
AST: 16 U/L (ref 15–41)
Alkaline Phosphatase: 56 U/L (ref 38–126)
BUN: 19 mg/dL (ref 8–23)
CO2: 26 mmol/L (ref 22–32)
Calcium: 8.8 mg/dL — ABNORMAL LOW (ref 8.9–10.3)
Chloride: 110 mmol/L (ref 98–111)
Creatinine: 0.72 mg/dL (ref 0.44–1.00)
GFR, Estimated: >60 mL/min (ref >60)
GLUCOSE: 92 mg/dL (ref 70–99)
POTASSIUM: 4.1 mmol/L (ref 3.5–5.1)
SODIUM: 144 mmol/L (ref 135–145)
Total Bilirubin: 0.3 mg/dL (ref 0.3–1.2)
Total Protein: 5.9 g/dL — ABNORMAL LOW (ref 6.5–8.1)

## 2018-03-27 MED ORDER — HEPARIN SOD (PORK) LOCK FLUSH 100 UNIT/ML IV SOLN
500.0000 [IU] | Freq: Once | INTRAVENOUS | Status: AC
  Administered 2018-03-27: 500 [IU]
  Filled 2018-03-27: qty 5

## 2018-03-27 MED ORDER — SODIUM CHLORIDE 0.9% FLUSH
10.0000 mL | Freq: Once | INTRAVENOUS | Status: AC
  Administered 2018-03-27: 10 mL
  Filled 2018-03-27: qty 10

## 2018-03-27 NOTE — Progress Notes (Signed)
Received call from vascular lab, patient negative for bilateral DVT, [name] aware.

## 2018-03-27 NOTE — Progress Notes (Signed)
*  Preliminary Results* Bilateral lower extremity venous duplex completed. Bilateral lower extremities are negative for deep vein thrombosis. There is no evidence of Baker's cyst bilaterally.  03/27/2018 2:43 PM Maudry Mayhew, MHA, RVT, RDCS, RDMS

## 2018-03-29 NOTE — H&P (Signed)
Darlene Beasley Location: [facility] Patient #: [id number] DOB: [birth year] Single / Language: Cleophus Beasley / Race: White Female        History of Present Illness        This is a 62 year old female who returns with her significant other following neoadjuvant chemotherapy to discuss definitive surgery for her bilateral breast cancer. [name] Her oncologist is [name]. She had a Port-A-Cath put in by interventional radiology.       She was initially evaluated on September 29, 2017 with a palpable mass in the right breast. Imaging showed a 7.5 cm mass on the right breast involving the upper breast extending to the midline involving the nipple and accounting for nipple retraction. Skin thickening was noted. 2 satellite masses were noted in the right breast lower outer quadrant and upper inner quadrant. Both of these have been biopsied and both showed breast cancer. 2 adjacent right axillary lymph nodes were pathologically enlarged. A 9 mm mass was noted involving the upper outer quadrant of the left breast 6 cm from the nipple. Left axilla ultrasound negative.      MRI showed disease in all 4 quadrants of the right breast and 5 pathologically enlarged right axillary lymph nodes were noted. Small mass was noted in the left breast measuring 9 mm upper outer in the left axilla was negative by MRI.        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 Receptor positive and HER-2 negative cast 67 5% Biopsy of a right axillary lymph node showed metastatic cancer Image guided biopsy of the left breast mass at 2:00 shows invasive mammary carcinoma. ER 100%. PR 0. Ki-67 30%. HER-2 negative. Subsequent biopsy of the 2 masses of the right breast 7:00 to 1:00 also showed cancer.      I saw her on February 03, 2018. [name] had referred her back to me because she is not responding to  chemotherapy and still has a palpable mass in the right breast. MRI showed a dominant mass on the right side to be 7.6 x 6.7 cm which is only slightly smaller. Persistent bulky right axillary adenopathy was minimally dictated decreased. Biopsy-proven left breast mass and it was smaller at 5 mm. [name] continued her chemotherapy through October 2 and is now finished. [name] told the patient that the Port-A-Cath could be removed. The patient saw [name] Because of tobacco use and concern about implants decision was to consider delayed reconstruction with autologous tissue transfer at a later date. That is the plan.       Past history reveals tobacco use and marijuana use but no profound medical problems. genetic testing did show some pathologically mutations but none of which affect breast cancer risk or survival. She was advised to see a dermatologist since genetic testing revealed predisposition of melanoma.  social history reveals she has a partner. She says she is down to 1 cigarette a day. Smokes marijuana. She works at [employer] and does lots of heavy work and lifting.      Plan: She has been scheduled for end of treatment MRI on October 11. I think that the [facility] scheduled that. We had a long discussion. She wants to keep her left breast. She is willing to have mastectomy on the right. She knows that plastic surgical reconstruction may require some revision  on the left to achieve symmetry. She will deal with that later She will be scheduled for removal of Port-A-Cath, right modified radical mastectomy, inject blue dye left breast, left partial mastectomy with RSL, left axillary sentinel lymph node biopsy. I discussed the indications, details, techniques and risk of the surgery with the patient and her partner in detail. She is where the risk of bleeding, infection, shoulder disability. Arm swelling. Arm numbness. Chronic pain. Reoperation on the  left if she has multiple positive nodes. She knows that she'll need radiation therapy on the right and the left. She understands all these issues. All of her questions are answered. She agrees with this plan.   Allergies  Poison Ivy  Honey Bee Venom *ALLERGENIC EXTRACTS/BIOLOGICALS MISC*  Allergies Reconciled   Medication History  Coenzyme Q-10 (100MG Capsule, Oral) Active. Benadryl Allergy (25MG Capsule, Oral) Active. Benzonatate (100MG Capsule, Oral) Active. Ondansetron HCl (8MG Tablet, Oral) Active. Vitamin B12 (1000MCG Tablet ER, Oral) Active. Vitamin E (100UNIT Tablet, Oral) Active. Medications Reconciled  Vitals  Weight: 179.2 lb Height: 64in Body Surface Area: 1.87 m Body Mass Index: 30.76 kg/m  Temp.: 98.102F  Pulse: 91 (Regular)  BP: 142/84 (Sitting, Left Arm, Standard)    Physical Exam  General Mental Status-Alert. General Appearance-Not in acute distress. Build & Nutrition-Well nourished. Posture-Normal posture. Gait-Normal.  Head and Neck Head-normocephalic, atraumatic with no lesions or palpable masses. Trachea-midline. Thyroid Gland Characteristics - normal size and consistency and no palpable nodules.  Chest and Lung Exam Chest and lung exam reveals -on auscultation, normal breath sounds, no adventitious sounds and normal vocal resonance.  Breast Note: Port left infraclavicular area. Looks fine. Large central mass right breast. 7 cm or greater in size. Palpable adenopathy right axilla. Mobile. Left breast reveals no mass or adenopathy.   Cardiovascular Cardiovascular examination reveals -normal heart sounds, regular rate and rhythm with no murmurs and femoral artery auscultation bilaterally reveals normal pulses, no bruits, no thrills.  Abdomen Inspection Inspection of the abdomen reveals - No Hernias. Palpation/Percussion Palpation and Percussion of the abdomen reveal - Soft, Non Tender, No Rigidity  (guarding), No hepatosplenomegaly and No Palpable abdominal masses.  Neurologic Neurologic evaluation reveals -alert and oriented x 3 with no impairment of recent or remote memory, normal attention span and ability to concentrate, normal sensation and normal coordination.  Musculoskeletal Normal Exam - Bilateral-Upper Extremity Strength Normal and Lower Extremity Strength Normal.    Assessment & Plan CANCER OF OVERLAPPING SITES OF RIGHT BREAST (C50.811)   As we discussed last month, you have not responded very well to the chemotherapy The cancer in your right breast is present in all 4 quadrants MRI shows multiple abnormal lymph nodes in the right axilla The cancer in the left breast, however is very small  you state that [name] is through with chemotherapy and has told you that the Port-A-Cath can be removed You have gone over all of your reconstructive options with [name], and you have decided to have delayed reconstruction on the right, symmetry procedures, at a later date after you have completed radiation therapy and have stopped smoking  You'll be scheduled for an end of treatment MRI, and that is scheduled on October 11  you stated that you would like to preserve your left breast and that is reasonable  you will be scheduled for right modified radical mastectomy, left breast lumpectomy with radioactive seed localization, left axillary sentinel lymph node biopsy, and removal of Port-A-Cath I have discussed the indications, details, techniques,  and risk of the surgery in detail with you and your partner We will schedule this surgery urgently  PRIMARY BREAST CANCER WITH METASTASIS TO MOVABLE IPSILATERAL LEVEL 1 OR 2 AXILLARY LYMPH NODES (N1) (C50.919) PRIMARY CANCER OF UPPER OUTER QUADRANT OF LEFT FEMALE BREAST (C50.412) TOBACCO USE (Z72.0) MARIJUANA USE (F12.90)    Haywood M. Dalbert Batman, M.D., [facility] General and Minimally invasive  Surgery Breast and Colorectal Surgery Office:   [phone] Pager:   [phone]

## 2018-03-30 ENCOUNTER — Telehealth: Payer: Self-pay | Admitting: Hematology

## 2018-03-30 NOTE — Progress Notes (Signed)
This was sent to me/ also one under message routing  Not my patient Can this be taken out of my in basket?  Thanks  I don't want to sign the notes as iff she is mine

## 2018-03-30 NOTE — Telephone Encounter (Signed)
No los 10/18 

## 2018-03-30 NOTE — Progress Notes (Signed)
Ensure pre surgery drink given with instructions to complete by [place], surgical soap given with instructions, pt's husband verbalized understanding.

## 2018-04-02 ENCOUNTER — Ambulatory Visit
Admission: RE | Admit: 2018-04-02 | Discharge: 2018-04-02 | Disposition: A | Discharge status: Home / Self Care | Payer: Medicaid Other | Source: Ambulatory Visit | Attending: General Surgery | Admitting: General Surgery

## 2018-04-02 DIAGNOSIS — C50911 Malignant neoplasm of unspecified site of right female breast: Secondary | ICD-10-CM

## 2018-04-02 DIAGNOSIS — C50912 Malignant neoplasm of unspecified site of left female breast: Principal | ICD-10-CM

## 2018-04-02 DIAGNOSIS — Z170 Estrogen receptor positive status [ER+]: Principal | ICD-10-CM

## 2018-04-02 NOTE — Anesthesia Preprocedure Evaluation (Signed)
Anesthesia Evaluation  Patient identified by MRN, date of birth, ID band Patient awake    Reviewed: Allergy & Precautions, H&P , NPO status , Patient's Chart, lab work & pertinent test results, reviewed documented beta blocker date and time   Airway Mallampati: II  TM Distance: >3 FB Neck ROM: full    Dental no notable dental hx.    Pulmonary neg pulmonary ROS, Current Smoker,    Pulmonary exam normal breath sounds clear to auscultation       Cardiovascular Exercise Tolerance: Good negative cardio ROS   Rhythm:regular Rate:Normal  Echo 19 Study Conclusions  - Left ventricle: The cavity size was normal. Wall thickness was   normal. Systolic function was vigorous. The estimated ejection   fraction was in the range of 65% to 70%. Wall motion was normal;   there were no regional wall motion abnormalities. Doppler   parameters are consistent with abnormal left ventricular   relaxation (grade 1 diastolic dysfunction).   Neuro/Psych negative neurological ROS  negative psych ROS   GI/Hepatic Neg liver ROS, GERD  ,  Endo/Other  negative endocrine ROS  Renal/GU negative Renal ROS  negative genitourinary   Musculoskeletal   Abdominal   Peds  Hematology negative hematology ROS (+)   Anesthesia Other Findings   Reproductive/Obstetrics negative OB ROS                             Anesthesia Physical Anesthesia Plan  ASA: II  Anesthesia Plan: General   Post-op Pain Management: GA combined w/ Regional for post-op pain   Induction:   PONV Risk Score and Plan: 3 and Ondansetron, Dexamethasone, Midazolam and Treatment may vary due to age or medical condition  Airway Management Planned: Oral ETT and LMA  Additional Equipment:   Intra-op Plan:   Post-operative Plan: Extubation in OR  Informed Consent: I have reviewed the patients History and Physical, chart, labs and discussed the  procedure including the risks, benefits and alternatives for the proposed anesthesia with the patient or authorized representative who has indicated his/her understanding and acceptance.   Dental Advisory Given  Plan Discussed with: CRNA  Anesthesia Plan Comments: (Discussed both nerve block for pain relief post-op and GA; including NV, sore throat, dental injury, and pulmonary complications)        Anesthesia Quick Evaluation

## 2018-04-03 ENCOUNTER — Ambulatory Visit (HOSPITAL_BASED_OUTPATIENT_CLINIC_OR_DEPARTMENT_OTHER): Payer: Medicaid Other | Admitting: Anesthesiology

## 2018-04-03 ENCOUNTER — Encounter (HOSPITAL_COMMUNITY)
Admission: RE | Admit: 2018-04-03 | Discharge: 2018-04-03 | Disposition: A | Discharge status: Home / Self Care | Payer: Medicaid Other | Source: Ambulatory Visit | Attending: General Surgery | Admitting: General Surgery

## 2018-04-03 ENCOUNTER — Encounter (HOSPITAL_BASED_OUTPATIENT_CLINIC_OR_DEPARTMENT_OTHER): Payer: Self-pay | Admitting: *Deleted

## 2018-04-03 ENCOUNTER — Ambulatory Visit
Admission: RE | Admit: 2018-04-03 | Discharge: 2018-04-03 | Disposition: A | Discharge status: Home / Self Care | Payer: Medicaid Other | Source: Ambulatory Visit | Attending: General Surgery | Admitting: General Surgery

## 2018-04-03 ENCOUNTER — Other Ambulatory Visit: Payer: Self-pay

## 2018-04-03 ENCOUNTER — Ambulatory Visit (HOSPITAL_BASED_OUTPATIENT_CLINIC_OR_DEPARTMENT_OTHER)
Admission: RE | Admit: 2018-04-03 | Discharge: 2018-04-04 | Disposition: A | Discharge status: Home / Self Care | Payer: Medicaid Other | Source: Ambulatory Visit | Attending: General Surgery | Admitting: General Surgery

## 2018-04-03 ENCOUNTER — Encounter (HOSPITAL_BASED_OUTPATIENT_CLINIC_OR_DEPARTMENT_OTHER)
Admission: RE | Disposition: A | Discharge status: Home / Self Care | Payer: Self-pay | Source: Ambulatory Visit | Attending: General Surgery

## 2018-04-03 DIAGNOSIS — F1290 Cannabis use, unspecified, uncomplicated: Secondary | ICD-10-CM | POA: Insufficient documentation

## 2018-04-03 DIAGNOSIS — Z79899 Other long term (current) drug therapy: Secondary | ICD-10-CM | POA: Diagnosis not present

## 2018-04-03 DIAGNOSIS — C773 Secondary and unspecified malignant neoplasm of axilla and upper limb lymph nodes: Secondary | ICD-10-CM | POA: Diagnosis not present

## 2018-04-03 DIAGNOSIS — F17200 Nicotine dependence, unspecified, uncomplicated: Secondary | ICD-10-CM | POA: Diagnosis not present

## 2018-04-03 DIAGNOSIS — Z9221 Personal history of antineoplastic chemotherapy: Secondary | ICD-10-CM | POA: Insufficient documentation

## 2018-04-03 DIAGNOSIS — C50912 Malignant neoplasm of unspecified site of left female breast: Principal | ICD-10-CM

## 2018-04-03 DIAGNOSIS — K219 Gastro-esophageal reflux disease without esophagitis: Secondary | ICD-10-CM | POA: Insufficient documentation

## 2018-04-03 DIAGNOSIS — Z923 Personal history of irradiation: Secondary | ICD-10-CM | POA: Diagnosis not present

## 2018-04-03 DIAGNOSIS — C50911 Malignant neoplasm of unspecified site of right female breast: Secondary | ICD-10-CM

## 2018-04-03 DIAGNOSIS — Z452 Encounter for adjustment and management of vascular access device: Secondary | ICD-10-CM | POA: Insufficient documentation

## 2018-04-03 DIAGNOSIS — C50811 Malignant neoplasm of overlapping sites of right female breast: Secondary | ICD-10-CM | POA: Diagnosis present

## 2018-04-03 DIAGNOSIS — Z91030 Bee allergy status: Secondary | ICD-10-CM | POA: Diagnosis not present

## 2018-04-03 DIAGNOSIS — Z170 Estrogen receptor positive status [ER+]: Principal | ICD-10-CM

## 2018-04-03 DIAGNOSIS — Z95828 Presence of other vascular implants and grafts: Secondary | ICD-10-CM

## 2018-04-03 DIAGNOSIS — C50412 Malignant neoplasm of upper-outer quadrant of left female breast: Secondary | ICD-10-CM | POA: Insufficient documentation

## 2018-04-03 HISTORY — PX: MASTECTOMY MODIFIED RADICAL: SHX5962

## 2018-04-03 HISTORY — PX: MASTECTOMY: SHX3

## 2018-04-03 HISTORY — PX: BREAST LUMPECTOMY WITH RADIOACTIVE SEED AND SENTINEL LYMPH NODE BIOPSY: SHX6550

## 2018-04-03 HISTORY — PX: PORT-A-CATH REMOVAL: SHX5289

## 2018-04-03 HISTORY — PX: BREAST LUMPECTOMY: SHX2

## 2018-04-03 SURGERY — BREAST LUMPECTOMY WITH RADIOACTIVE SEED AND SENTINEL LYMPH NODE BIOPSY
Anesthesia: General | Site: Breast | Laterality: Right

## 2018-04-03 MED ORDER — PROPOFOL 10 MG/ML IV BOLUS
INTRAVENOUS | Status: AC
  Filled 2018-04-03: qty 20

## 2018-04-03 MED ORDER — ONDANSETRON HCL 4 MG/2ML IJ SOLN
INTRAMUSCULAR | Status: AC
  Filled 2018-04-03: qty 12

## 2018-04-03 MED ORDER — GABAPENTIN 300 MG PO CAPS
ORAL_CAPSULE | ORAL | Status: AC
  Filled 2018-04-03: qty 1

## 2018-04-03 MED ORDER — MIDAZOLAM HCL 2 MG/2ML IJ SOLN
1.0000 mg | INTRAMUSCULAR | Status: DC | PRN
  Administered 2018-04-03: 2 mg via INTRAVENOUS

## 2018-04-03 MED ORDER — CHLORHEXIDINE GLUCONATE CLOTH 2 % EX PADS: 6.0000 | MEDICATED_PAD | Freq: Once | CUTANEOUS | Status: DC

## 2018-04-03 MED ORDER — GABAPENTIN 300 MG PO CAPS
300.0000 mg | ORAL_CAPSULE | ORAL | Status: AC
  Administered 2018-04-03: 300 mg via ORAL

## 2018-04-03 MED ORDER — LACTATED RINGERS IV SOLN
INTRAVENOUS | Status: DC
  Administered 2018-04-03 (×2): via INTRAVENOUS

## 2018-04-03 MED ORDER — CEFAZOLIN SODIUM-DEXTROSE 2-4 GM/100ML-% IV SOLN
2.0000 g | INTRAVENOUS | Status: AC
  Administered 2018-04-03: 2 g via INTRAVENOUS

## 2018-04-03 MED ORDER — ACETAMINOPHEN 160 MG/5ML PO SOLN: 325.0000 mg | ORAL | Status: DC | PRN

## 2018-04-03 MED ORDER — CELECOXIB 200 MG PO CAPS
ORAL_CAPSULE | ORAL | Status: AC
  Filled 2018-04-03: qty 1

## 2018-04-03 MED ORDER — ONDANSETRON HCL 4 MG/2ML IJ SOLN
INTRAMUSCULAR | Status: DC | PRN
  Administered 2018-04-03: 4 mg via INTRAVENOUS

## 2018-04-03 MED ORDER — MIDAZOLAM HCL 2 MG/2ML IJ SOLN
INTRAMUSCULAR | Status: AC
  Filled 2018-04-03: qty 2

## 2018-04-03 MED ORDER — ACETAMINOPHEN 500 MG PO TABS
ORAL_TABLET | ORAL | Status: AC
  Filled 2018-04-03: qty 1

## 2018-04-03 MED ORDER — OXYCODONE HCL 5 MG PO TABS: 5.0000 mg | ORAL_TABLET | Freq: Once | ORAL | Status: DC | PRN

## 2018-04-03 MED ORDER — ACETAMINOPHEN 500 MG PO TABS
ORAL_TABLET | ORAL | Status: AC
  Filled 2018-04-03: qty 2

## 2018-04-03 MED ORDER — PROPOFOL 500 MG/50ML IV EMUL
INTRAVENOUS | Status: DC | PRN
  Administered 2018-04-03: 25 ug/kg/min via INTRAVENOUS

## 2018-04-03 MED ORDER — ACETAMINOPHEN 500 MG PO TABS
1000.0000 mg | ORAL_TABLET | Freq: Four times a day (QID) | ORAL | Status: DC
  Administered 2018-04-03 – 2018-04-04 (×3): 1000 mg via ORAL
  Filled 2018-04-03 (×3): qty 2

## 2018-04-03 MED ORDER — OXYCODONE HCL 5 MG/5ML PO SOLN: 5.0000 mg | Freq: Once | ORAL | Status: DC | PRN

## 2018-04-03 MED ORDER — HYDROCODONE-ACETAMINOPHEN 7.5-325 MG PO TABS: 1.0000 | ORAL_TABLET | Freq: Four times a day (QID) | ORAL | Status: DC | PRN

## 2018-04-03 MED ORDER — SUGAMMADEX SODIUM 500 MG/5ML IV SOLN
INTRAVENOUS | Status: AC
  Filled 2018-04-03: qty 5

## 2018-04-03 MED ORDER — ACETAMINOPHEN 650 MG RE SUPP: 650.0000 mg | RECTAL | Status: DC | PRN

## 2018-04-03 MED ORDER — HYDROCODONE-ACETAMINOPHEN 5-325 MG PO TABS: 1.0000 | ORAL_TABLET | Freq: Four times a day (QID) | ORAL | 0 refills | Status: DC | PRN

## 2018-04-03 MED ORDER — SODIUM CHLORIDE 0.9% FLUSH
3.0000 mL | Freq: Two times a day (BID) | INTRAVENOUS | Status: DC
  Administered 2018-04-04: 3 mL via INTRAVENOUS

## 2018-04-03 MED ORDER — MEPERIDINE HCL 25 MG/ML IJ SOLN: 6.2500 mg | INTRAMUSCULAR | Status: DC | PRN

## 2018-04-03 MED ORDER — OXYCODONE HCL 5 MG PO TABS: 5.0000 mg | ORAL_TABLET | ORAL | Status: DC | PRN

## 2018-04-03 MED ORDER — ACETAMINOPHEN 325 MG PO TABS: 650.0000 mg | ORAL_TABLET | ORAL | Status: DC | PRN

## 2018-04-03 MED ORDER — EPHEDRINE SULFATE 50 MG/ML IJ SOLN
INTRAMUSCULAR | Status: DC | PRN
  Administered 2018-04-03 (×2): 10 mg via INTRAVENOUS

## 2018-04-03 MED ORDER — DEXAMETHASONE SODIUM PHOSPHATE 10 MG/ML IJ SOLN
INTRAMUSCULAR | Status: AC
  Filled 2018-04-03: qty 3

## 2018-04-03 MED ORDER — SODIUM CHLORIDE 0.9 % IV SOLN: 250.0000 mL | INTRAVENOUS | Status: DC | PRN

## 2018-04-03 MED ORDER — LIDOCAINE HCL (CARDIAC) PF 100 MG/5ML IV SOSY
PREFILLED_SYRINGE | INTRAVENOUS | Status: DC | PRN
  Administered 2018-04-03: 30 mg via INTRAVENOUS

## 2018-04-03 MED ORDER — ACETAMINOPHEN 325 MG PO TABS: 325.0000 mg | ORAL_TABLET | ORAL | Status: DC | PRN

## 2018-04-03 MED ORDER — SODIUM CHLORIDE 0.9% FLUSH: 3.0000 mL | INTRAVENOUS | Status: DC | PRN

## 2018-04-03 MED ORDER — PROPOFOL 10 MG/ML IV BOLUS
INTRAVENOUS | Status: DC | PRN
  Administered 2018-04-03: 120 mg via INTRAVENOUS
  Administered 2018-04-03: 30 mg via INTRAVENOUS

## 2018-04-03 MED ORDER — LIDOCAINE 2% (20 MG/ML) 5 ML SYRINGE
INTRAMUSCULAR | Status: AC
  Filled 2018-04-03: qty 20

## 2018-04-03 MED ORDER — FENTANYL CITRATE (PF) 100 MCG/2ML IJ SOLN
INTRAMUSCULAR | Status: AC
  Filled 2018-04-03: qty 2

## 2018-04-03 MED ORDER — DEXAMETHASONE SODIUM PHOSPHATE 4 MG/ML IJ SOLN
INTRAMUSCULAR | Status: DC | PRN
  Administered 2018-04-03: 10 mg via INTRAVENOUS

## 2018-04-03 MED ORDER — ROCURONIUM BROMIDE 50 MG/5ML IV SOSY
PREFILLED_SYRINGE | INTRAVENOUS | Status: AC
  Filled 2018-04-03: qty 5

## 2018-04-03 MED ORDER — CELECOXIB 200 MG PO CAPS
200.0000 mg | ORAL_CAPSULE | ORAL | Status: AC
  Administered 2018-04-03: 200 mg via ORAL

## 2018-04-03 MED ORDER — FENTANYL CITRATE (PF) 100 MCG/2ML IJ SOLN
50.0000 ug | INTRAMUSCULAR | Status: AC | PRN
  Administered 2018-04-03 (×4): 50 ug via INTRAVENOUS
  Administered 2018-04-03: 100 ug via INTRAVENOUS

## 2018-04-03 MED ORDER — ROPIVACAINE HCL 7.5 MG/ML IJ SOLN
INTRAMUSCULAR | Status: DC | PRN
  Administered 2018-04-03: 25 mL via PERINEURAL

## 2018-04-03 MED ORDER — ACETAMINOPHEN 500 MG PO TABS
1000.0000 mg | ORAL_TABLET | ORAL | Status: AC
  Administered 2018-04-03: 1000 mg via ORAL

## 2018-04-03 MED ORDER — ONDANSETRON HCL 4 MG/2ML IJ SOLN: 4.0000 mg | Freq: Four times a day (QID) | INTRAMUSCULAR | Status: DC | PRN

## 2018-04-03 MED ORDER — SCOPOLAMINE 1 MG/3DAYS TD PT72: 1.0000 | MEDICATED_PATCH | Freq: Once | TRANSDERMAL | Status: DC | PRN

## 2018-04-03 MED ORDER — BUPIVACAINE-EPINEPHRINE (PF) 0.25% -1:200000 IJ SOLN
INTRAMUSCULAR | Status: DC | PRN
  Administered 2018-04-03: 15 mL via PERINEURAL

## 2018-04-03 MED ORDER — LACTATED RINGERS IV SOLN
INTRAVENOUS | Status: DC
  Administered 2018-04-03: 13:00:00 via INTRAVENOUS

## 2018-04-03 MED ORDER — FENTANYL CITRATE (PF) 100 MCG/2ML IJ SOLN: 25.0000 ug | INTRAMUSCULAR | Status: DC | PRN

## 2018-04-03 MED ORDER — TECHNETIUM TC 99M SULFUR COLLOID FILTERED
1.0000 | Freq: Once | INTRAVENOUS | Status: AC | PRN
  Administered 2018-04-03: 1 via INTRADERMAL

## 2018-04-03 MED ORDER — ONDANSETRON HCL 4 MG/2ML IJ SOLN: 4.0000 mg | Freq: Once | INTRAMUSCULAR | Status: DC | PRN

## 2018-04-03 MED ORDER — MORPHINE SULFATE (PF) 4 MG/ML IV SOLN: 1.0000 mg | INTRAVENOUS | Status: DC | PRN

## 2018-04-03 MED ORDER — CLONIDINE HCL (ANALGESIA) 100 MCG/ML EP SOLN
EPIDURAL | Status: DC | PRN
  Administered 2018-04-03: 75 ug

## 2018-04-03 MED ORDER — SODIUM CHLORIDE 0.9 % IJ SOLN
INTRAVENOUS | Status: DC | PRN
  Administered 2018-04-03: 09:00:00 via INTRAMUSCULAR

## 2018-04-03 MED ORDER — PROPOFOL 500 MG/50ML IV EMUL
INTRAVENOUS | Status: AC
  Filled 2018-04-03: qty 150

## 2018-04-03 MED ORDER — SUGAMMADEX SODIUM 200 MG/2ML IV SOLN
INTRAVENOUS | Status: DC | PRN
  Administered 2018-04-03: 300 mg via INTRAVENOUS

## 2018-04-03 SURGICAL SUPPLY — 82 items
ADH SKN CLS APL DERMABOND .7 (GAUZE/BANDAGES/DRESSINGS) ×12
APL SKNCLS STERI-STRIP NONHPOA (GAUZE/BANDAGES/DRESSINGS)
APPLIER CLIP 11 MED OPEN (CLIP) ×5
APPLIER CLIP 9.375 MED OPEN (MISCELLANEOUS) ×10
APR CLP MED 11 20 MLT OPN (CLIP) ×3
APR CLP MED 9.3 20 MLT OPN (MISCELLANEOUS) ×6
BANDAGE ACE 6X5 VEL STRL LF (GAUZE/BANDAGES/DRESSINGS) IMPLANT
BENZOIN TINCTURE PRP APPL 2/3 (GAUZE/BANDAGES/DRESSINGS) IMPLANT
BINDER BREAST LRG (GAUZE/BANDAGES/DRESSINGS) IMPLANT
BINDER BREAST MEDIUM (GAUZE/BANDAGES/DRESSINGS) IMPLANT
BINDER BREAST XLRG (GAUZE/BANDAGES/DRESSINGS) IMPLANT
BINDER BREAST XXLRG (GAUZE/BANDAGES/DRESSINGS) IMPLANT
BLADE CLIPPER SURG (BLADE) IMPLANT
BLADE HEX COATED 2.75 (ELECTRODE) ×5 IMPLANT
BLADE SURG 10 STRL SS (BLADE) ×10 IMPLANT
BLADE SURG 15 STRL LF DISP TIS (BLADE) ×6 IMPLANT
BLADE SURG 15 STRL SS (BLADE) ×10
CANISTER SUCT 1200ML W/VALVE (MISCELLANEOUS) ×5 IMPLANT
CHLORAPREP W/TINT 26ML (MISCELLANEOUS) ×10 IMPLANT
CLIP APPLIE 11 MED OPEN (CLIP) ×3 IMPLANT
CLIP APPLIE 9.375 MED OPEN (MISCELLANEOUS) ×6 IMPLANT
CLOSURE WOUND 1/2 X4 (GAUZE/BANDAGES/DRESSINGS)
COVER BACK TABLE 60X90IN (DRAPES) ×5 IMPLANT
COVER MAYO STAND STRL (DRAPES) ×5 IMPLANT
COVER PROBE W GEL 5X96 (DRAPES) ×5 IMPLANT
COVER WAND RF STERILE (DRAPES) IMPLANT
DECANTER SPIKE VIAL GLASS SM (MISCELLANEOUS) IMPLANT
DERMABOND ADVANCED (GAUZE/BANDAGES/DRESSINGS) ×8
DERMABOND ADVANCED .7 DNX12 (GAUZE/BANDAGES/DRESSINGS) ×12 IMPLANT
DEVICE DUBIN W/COMP PLATE 8390 (MISCELLANEOUS) ×5 IMPLANT
DRAIN CHANNEL 19F RND (DRAIN) ×10 IMPLANT
DRAPE LAPAROSCOPIC ABDOMINAL (DRAPES) ×5 IMPLANT
DRAPE LAPAROTOMY 100X72 PEDS (DRAPES) ×5 IMPLANT
DRAPE UTILITY XL STRL (DRAPES) ×5 IMPLANT
DRSG PAD ABDOMINAL 8X10 ST (GAUZE/BANDAGES/DRESSINGS) ×15 IMPLANT
DRSG TEGADERM 4X4.75 (GAUZE/BANDAGES/DRESSINGS) IMPLANT
ELECT BLADE 4.0 EZ CLEAN MEGAD (MISCELLANEOUS) ×5
ELECT REM PT RETURN 9FT ADLT (ELECTROSURGICAL) ×5
ELECTRODE BLDE 4.0 EZ CLN MEGD (MISCELLANEOUS) ×3 IMPLANT
ELECTRODE REM PT RTRN 9FT ADLT (ELECTROSURGICAL) ×3 IMPLANT
EVACUATOR SILICONE 100CC (DRAIN) ×10 IMPLANT
GAUZE SPONGE 4X4 12PLY STRL (GAUZE/BANDAGES/DRESSINGS) ×5 IMPLANT
GAUZE SPONGE 4X4 12PLY STRL LF (GAUZE/BANDAGES/DRESSINGS) IMPLANT
GLOVE EUDERMIC 7 POWDERFREE (GLOVE) ×5 IMPLANT
GOWN STRL REUS W/ TWL LRG LVL3 (GOWN DISPOSABLE) ×3 IMPLANT
GOWN STRL REUS W/ TWL XL LVL3 (GOWN DISPOSABLE) ×3 IMPLANT
GOWN STRL REUS W/TWL LRG LVL3 (GOWN DISPOSABLE) ×5
GOWN STRL REUS W/TWL XL LVL3 (GOWN DISPOSABLE) ×5
ILLUMINATOR WAVEGUIDE N/F (MISCELLANEOUS) IMPLANT
KIT MARKER MARGIN INK (KITS) ×5 IMPLANT
LIGHT WAVEGUIDE WIDE FLAT (MISCELLANEOUS) IMPLANT
NDL SAFETY ECLIPSE 18X1.5 (NEEDLE) ×3 IMPLANT
NEEDLE HYPO 18GX1.5 SHARP (NEEDLE) ×5
NEEDLE HYPO 25X1 1.5 SAFETY (NEEDLE) ×10 IMPLANT
NS IRRIG 1000ML POUR BTL (IV SOLUTION) ×5 IMPLANT
PACK BASIN DAY SURGERY FS (CUSTOM PROCEDURE TRAY) ×5 IMPLANT
PAD ALCOHOL SWAB (MISCELLANEOUS) ×5 IMPLANT
PENCIL BUTTON HOLSTER BLD 10FT (ELECTRODE) ×5 IMPLANT
PIN SAFETY STERILE (MISCELLANEOUS) ×5 IMPLANT
SHEET MEDIUM DRAPE 40X70 STRL (DRAPES) ×10 IMPLANT
SLEEVE SCD COMPRESS KNEE MED (MISCELLANEOUS) ×5 IMPLANT
SPONGE LAP 18X18 RF (DISPOSABLE) ×20 IMPLANT
SPONGE LAP 4X18 RFD (DISPOSABLE) ×5 IMPLANT
STAPLER VISISTAT 35W (STAPLE) ×5 IMPLANT
STRIP CLOSURE SKIN 1/2X4 (GAUZE/BANDAGES/DRESSINGS) IMPLANT
SUT ETHILON 3 0 PS 1 (SUTURE) ×10 IMPLANT
SUT MNCRL AB 4-0 PS2 18 (SUTURE) ×15 IMPLANT
SUT SILK 2 0 SH (SUTURE) IMPLANT
SUT VIC AB 2-0 CT1 27 (SUTURE)
SUT VIC AB 2-0 CT1 TAPERPNT 27 (SUTURE) IMPLANT
SUT VIC AB 3-0 54X BRD REEL (SUTURE) ×3 IMPLANT
SUT VIC AB 3-0 BRD 54 (SUTURE) ×5
SUT VIC AB 3-0 SH 27 (SUTURE)
SUT VIC AB 3-0 SH 27X BRD (SUTURE) IMPLANT
SUT VICRYL 3-0 CR8 SH (SUTURE) ×15 IMPLANT
SYR 10ML LL (SYRINGE) ×10 IMPLANT
TAPE CLOTH SURG 4X10 WHT LF (GAUZE/BANDAGES/DRESSINGS) ×5 IMPLANT
TOWEL GREEN STERILE FF (TOWEL DISPOSABLE) ×10 IMPLANT
TOWEL OR NON WOVEN STRL DISP B (DISPOSABLE) ×5 IMPLANT
TUBE CONNECTING 20'X1/4 (TUBING) ×1
TUBE CONNECTING 20X1/4 (TUBING) ×4 IMPLANT
YANKAUER SUCT BULB TIP NO VENT (SUCTIONS) ×5 IMPLANT

## 2018-04-03 NOTE — Anesthesia Procedure Notes (Addendum)
Anesthesia Regional Block: Pectoralis block   Pre-Anesthetic Checklist: ,, timeout performed, Correct Patient, Correct Site, Correct Laterality, Correct Procedure, Correct Position, site marked, Risks and benefits discussed,  Surgical consent,  Pre-op evaluation,  At surgeon's request and post-op pain management  Laterality: Right  Prep: chloraprep       Needles:  Injection technique: Single-shot  Needle Type: Echogenic Stimulator Needle     Needle Length: 5cm  Needle Gauge: 22     Additional Needles:   Procedures:, nerve stimulator,,, ultrasound used (permanent image in chart),,,,  Narrative:  Start time: 04/03/2018 7:58 AM End time: 04/03/2018 8:03 AM Injection made incrementally with aspirations every 5 mL.  Performed by: Personally  Anesthesiologist: Janeece Riggers, MD  Additional Notes: Functioning IV was confirmed and monitors were applied.  A 73mm 22ga Arrow echogenic stimulator needle was used. Sterile prep and drape,hand hygiene and sterile gloves were used. Ultrasound guidance: relevant anatomy identified, needle position confirmed, local anesthetic spread visualized around nerve(s)., vascular puncture avoided.  Image printed for medical record. Negative aspiration and negative test dose prior to incremental administration of local anesthetic. The patient tolerated the procedure well.

## 2018-04-03 NOTE — Transfer of Care (Signed)
Immediate Anesthesia Transfer of Care Note  Patient: Darlene Beasley  Procedure(s) Performed: LEFT BREAST LUMPECTOMY WITH RADIOACTIVE SEED AND LEFT SENTINEL LYMPH NODE BIOPSY (Left Breast) MASTECTOMY MODIFIED RADICAL (Right Breast) REMOVAL PORT-A-CATH (N/A )  Patient Location: PACU  Anesthesia Type:GA combined with regional for post-op pain  Level of Consciousness: sedated and patient cooperative  Airway & Oxygen Therapy: Patient Spontanous Breathing and Patient connected to face mask oxygen  Post-op Assessment: Report given to RN and Post -op Vital signs reviewed and stable  Post vital signs: Reviewed and stable  Last Vitals:  Vitals Value Taken Time  BP    Temp    Pulse 79 04/03/2018 11:45 AM  Resp 0 04/03/2018 11:45 AM  SpO2 100 % 04/03/2018 11:45 AM  Vitals shown include unvalidated device data.  Last Pain:  Vitals:   04/03/18 0705  TempSrc: Oral  PainSc: 0-No pain      Patients Stated Pain Goal: 2 (67/67/20 9470)  Complications: No apparent anesthesia complications

## 2018-04-03 NOTE — Anesthesia Procedure Notes (Signed)
Procedure Name: Intubation Date/Time: 04/03/2018 9:10 AM Performed by: Signe Colt, CRNA Pre-anesthesia Checklist: Patient identified, Emergency Drugs available, Suction available and Patient being monitored Patient Re-evaluated:Patient Re-evaluated prior to induction Oxygen Delivery Method: Circle system utilized Preoxygenation: Pre-oxygenation with 100% oxygen Induction Type: IV induction Ventilation: Mask ventilation without difficulty Laryngoscope Size: Glidescope Tube type: Oral Tube size: 7.0 mm Number of attempts: 1 Airway Equipment and Method: Stylet and Oral airway Placement Confirmation: ETT inserted through vocal cords under direct vision,  positive ETCO2 and breath sounds checked- equal and bilateral Secured at: 21 cm Tube secured with: Tape Dental Injury: Teeth and Oropharynx as per pre-operative assessment

## 2018-04-03 NOTE — Interval H&P Note (Signed)
History and Physical Interval Note:  04/03/2018 6:57 AM  Darlene Beasley  has presented today for surgery, with the diagnosis of BILATERAL BREAST CANCER  The various methods of treatment have been discussed with the patient and family. After consideration of risks, benefits and other options for treatment, the patient has consented to  Procedure(s): LEFT BREAST LUMPECTOMY WITH RADIOACTIVE SEED AND LEFT SENTINEL LYMPH NODE BIOPSY (Left) MASTECTOMY MODIFIED RADICAL (Right) REMOVAL PORT-A-CATH (N/A) as a surgical intervention .  The patient's history has been reviewed, patient examined, no change in status, stable for surgery.  I have reviewed the patient's chart and labs.  Questions were answered to the patient's satisfaction.     Adin Hector

## 2018-04-03 NOTE — Op Note (Signed)
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 Dalbert Batman, M.D., FACS  Assistant:                      Or staff  Operative Indications:   This is a 62 year old female who is brought to the operating room for definitive surgery for her bilateral breast cancer. [name] Her oncologist is [name].        She was initially evaluated on September 29, 2017 with a palpable mass in the right breast. Imaging showed a 7.5 cm mass on the right breast involving the upper breast extending to the midline involving the nipple and accounting for nipple retraction. Skin thickening was noted. 2 satellite masses were noted in the right breast lower outer quadrant and upper inner quadrant. Both of these have been biopsied and both showed breast cancer. 2 adjacent right axillary lymph nodes were pathologically enlarged. A 9 mm mass was noted involving the upper outer quadrant of the left breast 6 cm from the nipple. Left axilla ultrasound negative.      MRI showed disease in all 4 quadrants of the right breast and 5 pathologically  enlarged right axillary lymph nodes were noted. Small mass was noted in the  left breast measuring 9 mm upper outer in the left axilla was negative by MRI.        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 Receptor positive and HER-2 negative ki-67 5% Biopsy of a right axillary lymph node showed metastatic cancer Image guided biopsy of the left breast mass at 2:00 shows invasive mammary carcinoma. ER 100%. PR 0. Ki-67 30%. HER-2 negative. Subsequent biopsy of the 2 masses of the right breast 7:00 to 1:00 also showed cancer.      I saw her on February 03, 2018. [name] had referred her back to me because she is not responding to chemotherapy and still has a palpable mass in the right breast. MRI showed a dominant mass on the right side to be 7.6 x 6.7 cm which is only slightly smaller. Persistent bulky right axillary adenopathy was minimally dictated decreased. Biopsy-proven left breast mass and it was smaller at 5 mm. [name] continued her chemotherapy through October 2 and this will complete her chemotherapy [name] told the patient that the Port-A-Cath could be removed. The patient saw [name] Because of tobacco use and concern about implants decision was to consider delayed reconstruction with autologous tissue transfer at a later date. That is the plan.       Past history reveals tobacco use and marijuana use but no profound medical problems. genetic testing did show some pathologically mutations but none of which affect breast cancer risk or survival.  We had a long discussion. She wants to keep her left breast. She is willing to have mastectomy on the right. She knows that plastic surgical reconstruction may require some revision on the left to achieve symmetry. She will deal with that later She will be scheduled for removal of Port-A-Cath, right modified radical mastectomy, inject blue dye left breast, left partial mastectomy with RSL, left axillary sentinel lymph node  biopsy.   Operative Findings:       The Port-A-Cath was removed without difficulty and there was no sign of infection.  The port and catheter were removed intact. The left breast reveals no palpable mass.  The lumpectomy mammogram looked good and contained the marker clip and the radioactive seed in the center of the specimen.  I found 3 sentinel lymph nodes in the left axilla A right modified radical mastectomy was performed.  We basically took all of the level 1 and level 2 lymph nodes out.  Some of these felt somewhat firm but none of them were significantly enlarged.  The tumor in the right breast was central and felt the size of a baseball but was not invading the skin and was not invading the muscle.  It felt like a clean resection  Procedure in Detail:          Following the induction of general endotracheal anesthesia a surgical timeout was performed and intravenous antibiotics were given.  Following alcohol prep I injected 5 cc of dilute methylene blue into the left breast subareolar area and massaged the left breast for a few minutes we then prepped and draped in the chest, bilateral chest walls and axilla down to the umbilicus.  0.5% Marcaine with epinephrine was used as a local infiltration anesthetic the patient had had a left pectoral block preop.      I made a transverse incision in the  left infraclavicular area, overlying the port, through the previous scar.  I dissected down and incised the capsule around the port.  Prolene sutures were cut and removed.  The port and catheter were removed intact.  There was no bleeding.  The subcutaneous tissue was closed with interrupted 3-0 Vicryl and the skin closed with a running subcuticular 4-0 Monocryl and Dermabond.      Next I performed the left breast surgery.  Using the neoprobe I identified the area of radioactivity.  This was very lateral in the left breast at about the 3 o'clock position.  I made a curvilinear somewhat circumareolar incision  in the lateral left breast.  The lumpectomy was performed using the neoprobe and electrocautery.  The specimen was removed and marked with silk sutures and a 6 color ink kit.  The specimen mammogram looked very good.  The specimen was sent to the lab where the seed was retrieved.  The wound was irrigated.  Hemostasis was achieved with electrocautery.  5 metal marker clips were placed in the walls of the left lumpectomy cavity.  The breast tissues were reapproximated in several layers with interrupted 3-0 Vicryl and the skin closed with a running subcuticular 4-0 Monocryl and Dermabond.  A transverse incision was made in the left axilla at the hairline.  Dissection was carried down through the clavipectoral fascia.  The axillary space was entered.  Using the neoprobe I identified and removed 3 sentinel lymph nodes.  These were very hot and very blue.  That was all I found.  Hemostasis was excellent and achieved with electrocautery and metal clips.  After irrigating the wound I closed the clavipectoral fascia with 3-0 Vicryl sutures.  Subcutaneous tissue closed with 3-0 Vicryl sutures and skin closed with a running subcuticular 4-0 Monocryl and Dermabond.      Next I performed a right modified radical mastectomy.  I planned and marked a transverse elliptical incision using a marking pen.  A transverse elliptical incision was made with a knife.  Skin flaps were raised superiorly to the infraclavicular area, medially to the parasternal area, inferiorly to the anterior rectus sheath below the inframammary crease and laterally to the anterior border of the latissimus dorsi muscle.  The breast was then dissected off of the pectoralis major and minor muscles.  The lateral skin margin was marked with a silk suture.  I dissected the lateral border of the pectoralis major and pectoralis minor muscles up off of the clavipectoral fascia and retracted them medially.  I entered the axilla and took the dissection all the way up to  the axillary vein.  I took all of the level 1 and level 2 lymph nodes out.  I identified the thoracodorsal neurovascular bundle and the long thoracic nerve and these were functioning and preserved at the end of the case.  A few venous tributaries and lymphatics were controlled with metal clips.  Some of the lateral axillary tissue was clamped divided and ligated with 3-0 silk ties.  The specimen was sent to the lab with appropriate history attached.  The mastectomy and axillary wounds were irrigated with saline.  Hemostasis was excellent.  I placed two    19 French Blake drains, one up into the axillary space and one across the skin flaps.  These were brought out through separate stab incisions inferior laterally and sutured to the skin with nylon sutures and connected to suction bulbs.  Subcutaneous tissue was closed with interrupted 3-0 Vicryl and the skin closed  with a running subcuticular 4-0 Monocryl and Dermabond.  Clean bandages and a breast binder were placed.  The patient tolerated the procedure well was taken to PACU in stable condition.  EBL 150 cc or less.  Counts correct.  Complications none.    Addendum: I logged onto the [employer] and reviewed her prescription medication history.     Edsel Petrin. Dalbert Batman, M.D., FACS General and Minimally Invasive Surgery Breast and Colorectal Surgery  04/03/2018 11:42 AM

## 2018-04-03 NOTE — Progress Notes (Signed)
Assisted Dr. Oddono with right, ultrasound guided, pectoralis block. Side rails up, monitors on throughout procedure. See vital signs in flow sheet. Tolerated Procedure well. 

## 2018-04-03 NOTE — Anesthesia Procedure Notes (Signed)
Performed by: Kinsleigh Ludolph D, CRNA       

## 2018-04-04 DIAGNOSIS — C50811 Malignant neoplasm of overlapping sites of right female breast: Secondary | ICD-10-CM | POA: Diagnosis not present

## 2018-04-04 MED ORDER — SODIUM CHLORIDE FLUSH 0.9 % IV SOLN
INTRAVENOUS | Status: AC
  Filled 2018-04-04: qty 10

## 2018-04-04 NOTE — Discharge Instructions (Signed)
Move both shoulders around frequently Take a walk around the block every day You may take a quick shower starting on Monday if you wish  Keep a written record of the drainage and bring that to the office with you    [facility] Office Phone Number [phone]  BREAST BIOPSY/ PARTIAL MASTECTOMY: POST OP INSTRUCTIONS  Always review your discharge instruction sheet given to you by the facility where your surgery was performed.  IF YOU HAVE DISABILITY OR FAMILY LEAVE FORMS, YOU MUST BRING THEM TO THE OFFICE FOR PROCESSING.  DO NOT GIVE THEM TO YOUR DOCTOR.  1. A prescription for pain medication may be given to you upon discharge.  Take your pain medication as prescribed, if needed.  If narcotic pain medicine is not needed, then you may take acetaminophen (Tylenol) or ibuprofen (Advil) as needed. 2. Take your usually prescribed medications unless otherwise directed 3. If you need a refill on your pain medication, please contact your pharmacy.  They will contact our office to request authorization.  Prescriptions will not be filled after 5pm or on week-ends. 4. You should eat very light the first 24 hours after surgery, such as soup, crackers, pudding, etc.  Resume your normal diet the day after surgery. 5. Most patients will experience some swelling and bruising in the breast.  Ice packs and a good support bra will help.  Swelling and bruising can take several days to resolve.  6. It is common to experience some constipation if taking pain medication after surgery.  Increasing fluid intake and taking a stool softener will usually help or prevent this problem from occurring.  A mild laxative (Milk of Magnesia or Miralax) should be taken according to package directions if there are no bowel movements after 48 hours. 7. Unless discharge instructions indicate otherwise, you may remove your bandages 24-48 hours after surgery, and you may shower at that time.  You may have steri-strips  (small skin tapes) in place directly over the incision.  These strips should be left on the skin for 7-10 days.  If your surgeon used skin glue on the incision, you may shower in 24 hours.  The glue will flake off over the next 2-3 weeks.  Any sutures or staples will be removed at the office during your follow-up visit. 8. ACTIVITIES:  You may resume regular daily activities (gradually increasing) beginning the next day.  Wearing a good support bra or sports bra minimizes pain and swelling.  You may have sexual intercourse when it is comfortable. a. You may drive when you no longer are taking prescription pain medication, you can comfortably wear a seatbelt, and you can safely maneuver your car and apply brakes. b. RETURN TO WORK:  ______________________________________________________________________________________ 9. You should see your doctor in the office for a follow-up appointment approximately two weeks after your surgery.  Your doctors nurse will typically make your follow-up appointment when she calls you with your pathology report.  Expect your pathology report 2-3 business days after your surgery.  You may call to check if you do not hear from [place] after three days. 10. OTHER INSTRUCTIONS: _______________________________________________________________________________________________ _____________________________________________________________________________________________________________________________________ _____________________________________________________________________________________________________________________________________ _____________________________________________________________________________________________________________________________________  WHEN TO CALL YOUR DOCTOR: 1. Fever over 101.0 2. Nausea and/or vomiting. 3. Extreme swelling or bruising. 4. Continued bleeding from incision. 5. Increased pain, redness, or drainage from the incision.  The clinic  staff is available to answer your questions during regular business hours.  Please dont hesitate to call and ask to speak to one  of the nurses for clinical concerns.  If you have a medical emergency, go to the nearest emergency room or call 911.  A surgeon from [facility] is always on call at the hospital.  For further questions, please visit [email or web address] [phone]  MASTECTOMY: POST OP INSTRUCTIONS  Always review your discharge instruction sheet given to you by the facility where your surgery was performed. IF YOU HAVE DISABILITY OR FAMILY LEAVE FORMS, YOU MUST BRING THEM TO THE OFFICE FOR PROCESSING.   DO NOT GIVE THEM TO YOUR DOCTOR. A prescription for pain medication may be given to you upon discharge.  Take your pain medication as prescribed, if needed.  If narcotic pain medicine is not needed, then you may take acetaminophen (Tylenol) or ibuprofen (Advil) as needed. 1. Take your usually prescribed medications unless otherwise directed. 2. If you need a refill on your pain medication, please contact your pharmacy.  They will contact our office to request authorization.  Prescriptions will not be filled after 5pm or on week-ends. 3. You should follow a light diet the first few days after arrival home, such as soup and crackers, etc.  Resume your normal diet the day after surgery. 4. Most patients will experience some swelling and bruising on the chest and underarm.  Ice packs will help.  Swelling and bruising can take several days to resolve.  5. It is common to experience some constipation if taking pain medication after surgery.  Increasing fluid intake and taking a stool softener (such as Colace) will usually help or prevent this problem from occurring.  A mild laxative (Milk of Magnesia or Miralax) should be taken according to package instructions if there are no bowel movements after 48 hours. 6. Unless discharge instructions  indicate otherwise, leave your bandage dry and in place until your next appointment in 3-5 days.  You may take a limited sponge bath.  No tube baths or showers until the drains are removed.  You may have steri-strips (small skin tapes) in place directly over the incision.  These strips should be left on the skin for 7-10 days.  If your surgeon used skin glue on the incision, you may shower in 24 hours.  The glue will flake off over the next 2-3 weeks.  Any sutures or staples will be removed at the office during your follow-up visit. 7. DRAINS:  If you have drains in place, it is important to keep a list of the amount of drainage produced each day in your drains.  Before leaving the hospital, you should be instructed on drain care.  Call our office if you have any questions about your drains. 8. ACTIVITIES:  You may resume regular (light) daily activities beginning the next day--such as daily self-care, walking, climbing stairs--gradually increasing activities as tolerated.  You may have sexual intercourse when it is comfortable.  Refrain from any heavy lifting or straining until approved by your doctor. a. You may drive when you are no longer taking prescription pain medication, you can comfortably wear a seatbelt, and you can safely maneuver your car and apply brakes. b. RETURN TO WORK:  __________________________________________________________ 9. You should see your doctor in the office for a follow-up appointment approximately 3-5 days after your surgery.  Your doctors nurse will typically make your follow-up appointment when she calls you with your pathology report.  Expect your pathology report 2-3 business days after your surgery.  You may call to check if you do not hear from  [place] after three days.   10. OTHER INSTRUCTIONS: ______________________________________________________________________________________________  ____________________________________________________________________________________________ WHEN TO CALL YOUR DOCTOR: 6. Fever over 101.0 7. Nausea and/or vomiting 8. Extreme swelling or bruising 9. Continued bleeding from incision. 10. Increased pain, redness, or drainage from the incision. The clinic staff is available to answer your questions during regular business hours.  Please dont hesitate to call and ask to speak to one of the nurses for clinical concerns.  If you have a medical emergency, go to the nearest emergency room or call 911.  A surgeon from [facility] is always on call at the hospital. [address], [place], [place], [place]  [address] ? [address], [place], [place]   [address] [phone] ? [phone] ? FAX [phone] Web site: [email or web address]  About my Jackson-Pratt Bulb Drain  What is a Jackson-Pratt bulb? A Jackson-Pratt is a soft, round device used to collect drainage. It is connected to a long, thin drainage catheter, which is held in place by one or two small stiches near your surgical incision site. When the bulb is squeezed, it forms a vacuum, forcing the drainage to empty into the bulb.  Emptying the Jackson-Pratt bulb- To empty the bulb: 1. Release the plug on the top of the bulb. 2. Pour the bulb's contents into a measuring container which your nurse will provide. 3. Record the time emptied and amount of drainage. Empty the drain(s) as often as your     doctor or nurse recommends.  Date                  Time                    Amount (Drain 1)                 Amount (Drain  2)  _____________________________________________________________________  _____________________________________________________________________  _____________________________________________________________________  _____________________________________________________________________  _____________________________________________________________________  _____________________________________________________________________  _____________________________________________________________________  _____________________________________________________________________  Squeezing the Jackson-Pratt Bulb- To squeeze the bulb: 1. Make sure the plug at the top of the bulb is open. 2. Squeeze the bulb tightly in your fist. You will hear air squeezing from the bulb. 3. Replace the plug while the bulb is squeezed. 4. Use a safety pin to attach the bulb to your clothing. This will keep the catheter from     pulling at the bulb insertion site.  When to call your doctor- Call your doctor if:  Drain site becomes red, swollen or hot.  You have a fever greater than 101 degrees F.  There is oozing at the drain site.  Drain falls out (apply a guaze bandage over the drain hole and secure it with tape).  Drainage increases daily not related to activity patterns. (You will usually have more drainage when you are active than when you are resting.)  Drainage has a bad odor.

## 2018-04-06 ENCOUNTER — Encounter (HOSPITAL_BASED_OUTPATIENT_CLINIC_OR_DEPARTMENT_OTHER): Payer: Self-pay | Admitting: General Surgery

## 2018-04-06 NOTE — Anesthesia Postprocedure Evaluation (Signed)
Anesthesia Post Note  Patient: Darlene Beasley  Procedure(s) Performed: LEFT BREAST LUMPECTOMY WITH RADIOACTIVE SEED AND LEFT SENTINEL LYMPH NODE BIOPSY (Left Breast) MASTECTOMY MODIFIED RADICAL (Right Breast) REMOVAL PORT-A-CATH (N/A )     Patient location during evaluation: PACU Anesthesia Type: General Level of consciousness: awake and alert Pain management: pain level controlled Vital Signs Assessment: post-procedure vital signs reviewed and stable Respiratory status: spontaneous breathing, nonlabored ventilation, respiratory function stable and patient connected to nasal cannula oxygen Cardiovascular status: blood pressure returned to baseline and stable Postop Assessment: no apparent nausea or vomiting Anesthetic complications: no    Last Vitals:  Vitals:   04/04/18 0600 04/04/18 0645  BP:  (!) 155/87  Pulse:  78  Resp:  18  Temp:  (!) 36.3 C  SpO2: 97% 99%    Last Pain:  Vitals:   04/04/18 0730  TempSrc:   PainSc: 0-No pain                 [name]

## 2018-04-06 NOTE — Discharge Summary (Signed)
Patient ID: Darlene Beasley [id number] 62 y.o. January 28, 1956  Admit date: 04/03/2018  Discharge date and time: 04/04/2018  7:45 AM  Admitting Physician: Adin Hector  Discharge Physician: Adin Hector  Admission Diagnoses: BILATERAL BREAST CANCER  Discharge Diagnoses: Bilateral breast cancer                                         Tobacco use                                          Marijuana use  Operations: Procedure(s): LEFT BREAST LUMPECTOMY WITH RADIOACTIVE SEED AND LEFT DEEP SENTINEL LYMPH NODE BIOPSY, inject blue dye left breast. RIGHT MASTECTOMY MODIFIED RADICAL REMOVAL PORT-A-CATH  Admission Condition: good  Discharged Condition: good  Indication for Admission: This is a 62 year old female who is brought to the operating room for definitive surgery for her bilateral breast cancer. [name] Her oncologist is [name].  She was initially evaluated on September 29, 2017 with a palpable mass in the right breast. Imaging showed a 7.5 cm mass on the right breast involving the upper breast extending to the midline involving the nipple and accounting for nipple retraction. Skin thickening was noted. 2 satellite masses were noted in the right breast lower outer quadrant and upper inner quadrant. Both of these have been biopsied and both showed breast cancer. 2 adjacent right axillary lymph nodes were pathologically enlarged. A 9 mm mass was noted involving the upper outer quadrant of the left breast 6 cm from the nipple. Left axilla ultrasound negative. MRI showed disease in all 4 quadrants of the right breast and 5 pathologically enlarged right axillary lymph nodes were noted. Small mass was noted in the left breast measuring 9 mm upper outer in the left axilla was negative by MRI. 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 Receptor  positive and HER-2 negative ki-67 5% Biopsy of a right axillary lymph node showed metastatic cancer Image guided biopsy of the left breast mass at 2:00 shows invasive mammary carcinoma. ER 100%. PR 0. Ki-67 30%. HER-2 negative. Subsequent biopsy of the 2 masses of the right breast 7:00 to 1:00 also showed cancer. I saw her on February 03, 2018. [name] had referred her back to me because she is not responding to chemotherapy and still has a palpable mass in the right breast. MRI showed a dominant mass on the right side to be 7.6 x 6.7 cm which is only slightly smaller. Persistent bulky right axillary adenopathy was minimally dictated decreased. Biopsy-proven left breast mass and it was smaller at 5 mm. [name] continued her chemotherapy through October 2 and this will complete her chemotherapy [name] told the patient that the Port-A-Cath could be removed. The patient saw [name] Because of tobacco use and concern about implants decision was to consider delayed reconstruction with autologous tissue transfer at a later date. That is the plan. Past history reveals tobacco use and marijuana use but no profound medical problems. genetic testing did show some pathologically mutations but none of which affect breast cancer risk or survival.  We had a long discussion. She wants to keep her left breast. She  is willing to have mastectomy on the right. She knows that plastic surgical reconstruction may require some revision on the left to achieve symmetry. She will deal with that later She will be scheduled for removal of Port-A-Cath, right modified radical mastectomy, inject blue dye left breast, left partial mastectomy with RSL, left axillary sentinel lymph node biopsy.  Hospital Course: On the day of admission the patient was taken to the operating room and underwent removal of Port-A-Cath, inject blue dye left breast, left breast lumpectomy with radioactive seed localization,  left axillary deep sentinel lymph node biopsy, and right modified radical mastectomy.  The surgery was uneventful and the final pathology is pending at this time.  She was admitted overnight for observation and did well.  The following morning she felt well and wanted to go home.  She had no problems with diet, mobilization, pain, activities or wound care.  She was instructed in diet activities and shoulder range of motion exercises.  She was instructed in management and recordkeeping for her drains.  We called in a prescription for Norco to her pharmacy but encouraged the use of high-dose Tylenol as necessary.  She was asked to make an appointment to see me in 12 to 14 days for wound and drain check.  Consults: None  Significant Diagnostic Studies: Surgical pathology, pendingsurgery:   Treatments: surgery: Detailed above  Disposition: Home  Patient Instructions:  Allergies as of 04/04/2018      Reactions   Bee Venom       Medication List    STOP taking these medications   ondansetron 8 MG tablet Commonly known as:  ZOFRAN     TAKE these medications   Coenzyme Q10 100 MG capsule Take 100 mg by mouth daily.   HYDROcodone-acetaminophen 5-325 MG tablet Commonly known as:  NORCO/VICODIN Take 1-2 tablets by mouth every 6 (six) hours as needed for moderate pain or severe pain. Notes to patient:  You can take a tablet at 11 AM if needed;    lidocaine-prilocaine cream Commonly known as:  EMLA Apply to affected area once   prochlorperazine 10 MG tablet Commonly known as:  COMPAZINE Take 1 tablet (10 mg total) by mouth every 6 (six) hours as needed (Nausea or vomiting).   vitamin B-12 1000 MCG tablet Commonly known as:  CYANOCOBALAMIN Take 1,000 mcg by mouth daily.   vitamin E 100 UNIT capsule Take 100 Units by mouth daily.       Activity: Frequent ambulation.  Shoulder range of motion exercises.  No driving for 2 weeks. Diet: low fat, low cholesterol diet Wound Care: as  directed  Follow-up:  With Dr. Dalbert Batman in 2 weeks    Addendum: I logged onto the [facility] website and reviewed her prescription medication history.     Signed: Edsel Petrin. Dalbert Batman, M.D., FACS General and minimally invasive surgery Breast and Colorectal Surgery  04/06/2018, 8:06 AM

## 2018-04-15 NOTE — Progress Notes (Signed)
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  Patient noticed right nipple inversion in January 2019.  Breast MRI 10/07/2017  IMPRESSION: 1. Biopsy proven malignancy involving all 4 quadrants of the right breast, although predominantly located in the anterior third of the central upper right breast measures 6.8 x 9.6 x 7.2 cm.  2. Five pathologic right axillary lymph nodes, compatible with biopsy proven axillary metastases.  3. Small mass with associated biopsy related changes in the upper-outer left breast at site of known malignancy measures 0.9 x 0.8 x 0.7 cm. No abnormal lymph nodes seen in the left axilla.  RECOMMENDATION: Treatment plan for known bilateral breast malignancy.  BI-RADS CATEGORY 6: Known biopsy-proven malignancy.     Mammogram 09/09/2017:   IMPRESSION: 1. Highly suspicious large right breast mass involving the outer and upper breast extending from approximately 9 o'clock through 12-1 o'clock, maximum measurement approximating 7.5 cm. The mass involves the right nipple, accounting for nipple retraction. Architectural distortion, microcalcifications and diffuse trabecular thickening and skin thickening are associated with the large mass, raising the possibility of this representing an inflammatory cancer. 2. Satellite masses separate from the dominant mass involving the lower outer quadrant and the upper inner quadrant of the right breast, measured above. 3. Two adjacent pathologic right axillary lymph nodes. 4. Indeterminate 0.9 cm solid mass involving the upper outer quadrant of the left breast which accounts for a  mammographic finding. 5. No pathologic left axillary lymphadenopathy.    Breast US 09/09/2017 Right Breast Targeted right breast ultrasound is performed, showing a very large hypoechoic mass with irregular margins extending from the approximate 9 o'clock position through the 12 to 1 o'clock position. On the CC gait image, the mass measures maximally approximately 7.5 cm. The mass has irregular margins, demonstrates acoustic shadowing, and demonstrates internal power Doppler flow. The mass extends into the retroareolar region and involves the nipple, accounting for the nipple retraction.  Separate from the dominant mass at the 7 o'clock position approximately 4 cm from nipple is a hypoechoic antiparallel mass with irregular margins measuring approximately 1.4 x 2.7 x 1.7 cm.  Separate from the dominant mass at the 1 o'clock position approximately 2 cm from nipple is a hypoechoic mass with irregular margins measuring approximately 1.7 x 0.5 x 1.6 cm. Both of these masses also demonstrate internal power Doppler flow.  Sonographic evaluation of the right axilla demonstrates 2 adjacent pathologic lymph nodes, the larger measuring approximately 2.5 x 2.5 x 2.8 cm, the smaller measuring approximately 2.0 x 0.8 x 2.7 cm.  Breast US 09/09/2017- Left Breast Targeted left breast ultrasound is performed, showing an oval parallel hypoechoic mass containing microcalcifications at the 2 o'clock position approximately 6 cm from nipple measuring approximately 0.9 x 0.5 x 0.9 cm, corresponding to the mammographic finding.  Sonographic evaluation of the left axilla demonstrates no pathologic lymphadenopathy.   Histology per Pathology Report: Left/Right Breast 09/23/2017   1. HER2 Negative by FISH Receptor Status: ER (+ 100%), PR (+ 80%), Her2-neu (-), Ki-67(5%)- Left Breast   2. HER2 Negative by FISH Receptor Status: ER(+ 100%), PR (- 0%), Her2-neu (-), Ki-67(30%)- Right Breast 2. The tumor cells  are strongly positive for E-cadherin, supporting  a ductal phenotype           Past/Anticipated interventions by surgeon, if any: FINAL DIAGNOSIS Diagnosis 04-03-18  Dr. [name] 1. Breast, lumpectomy, left with radioactive seed - RESIDUAL INVASIVE DUCTAL CARCINOMA STATUS POST NEOADJUVANT THERAPY (0.9 CM) - MARGINS UNINVOLVED BY CARCINOMA (0.2 CM; INFERIOR MARGIN) - CALCIFICATIONS ASSOCIATED WITH CARCINOMA - PREVIOUS BIOPSY SITE CHANGES PRESENT - SEE ONCOLOGY TABLE AND COMMENT BELOW 1 of 6 FINAL for Darlene Beasley, Darlene Beasley [phone]) Diagnosis(continued) 2. Lymph node, sentinel, biopsy, left axillary #1 - NO CARCINOMA IDENTIFIED IN ONE LYMPH NODE (0/1) - SEE COMMENT 3. Lymph node, sentinel, biopsy, left axillary #2 - NO CARCINOMA IDENTIFIED IN ONE LYMPH NODE (0/1) - SEE COMMENT 4. Lymph node, sentinel, biopsy, left - NO CARCINOMA IDENTIFIED IN ONE LYMPH NODE (0/1) - SEE COMMENT 5. Lymph node, sentinel, biopsy, left axillary #3 - NO CARCINOMA IDENTIFIED IN ONE LYMPH NODE (0/1) - SEE COMMENT 6. Breast, modified radical mastectomy , right - RESIDUAL INVASIVE DUCTAL CARCINOMA STATUS POST NEOADJUVANT THERAPY (8.5 CM) - EXTENSIVE LYMPHOVASCULAR SPACE INVASION PRESENT - METASTATIC CARCINOMA INVOLVING FIVE OF FIFTEEN LYMPH NODES WITH EXTRACAPSULAR EXTENSION (5/15) - MARGINS UNINVOLVED BY CARCINOMA - SEE ONCOLOGY TABLE AND COMMENT BELOW Microscopic Comment 1. INVASIVE CARCINOMA OF THE BREAST: STATUS POST NEOADJUVANT TREATMENT   Past/Anticipated interventions by medical oncology, if any:    Chemotherapy  AC q2 weeks x4 cycles starting 10/22/17-12/03/17, followed by weekly taxol x12 starting 12/17/17  11-14-17  Genetic Testing      The genes that were analyzed were the83 genes on [facility]'s Multi-Cancer panel (ALK, APC, ATM, AXIN2, BAP1, BARD1, BLM, BMPR1A, BRCA1, BRCA2, BRIP1, CASR, CDC73, CDH1, CDK4, CDKN1B, CDKN1C, CDKN2A, CEBPA, CHEK2, CTNNA1, DICER1, DIS3L2, EGFR, EPCAM,  FH, FLCN, GATA2, GPC3, GREM1, HOXB13, HRAS, KIT, MAX, MEN1, MET, MITF, MLH1, MSH2, MSH3, MSH6, MUTYH, NBN, NF1, NF2, NTHL1, PALB2, PDGFRA, PHOX2B, PMS2, POLD1, POLE, POT1, PRKAR1A, PTCH1, PTEN, RAD50, RAD51C, RAD51D, RB1, RECQL4, RET, RUNX1, SDHA, SDHAF2, SDHB, SDHC, SDHD, SMAD4, SMARCA4, SMARCB1, SMARCE1, STK11, SUFU, TERC, TERT, TMEM127, TP53, TSC1, TSC2, VHL, WRN, WT1).  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         10-17-17 CT CAP IMPRESSION: 1. Infiltrative right breast mass with overlying skin thickening is identified compatible with known breast cancer. 2. Enlarged right axillary lymph nodes compatible with metastatic adenopathy. 3. No additional sites of disease identified within the chest and no evidence for metastasis to the abdomen or pelvis. 4. Aortic atherosclerosis and 3 vessel coronary artery atherosclerotic calcifications. Aortic Atherosclerosis  10-17-17 Bone Scan FINDINGS: There are no foci of increased or decreased radiotracer uptake to suggest osseous metastatic disease. 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 There is degenerative type uptake within both shoulders and the left ankle.  Normal physiologic activity is identified within the kidneys and urinary bladder.  IMPRESSION: No evidence of osseous metastatic disease.             Lymphedema issues, if any: Some ROM to right arm and left arm Skin to right breast and left breast Follow up visit to see Dr. [name]  Pain issues, if any: None    SAFETY ISSUES:  Prior radiation? No  Pacemaker/ICD? No  Possible current pregnancy? No, Tubal ligation  Is the patient on methotrexate? No  GYN HISTORY  Menarchal:age 75 LMP:age 26  Contraceptive:age  37-38 OCP IWO:EHOZ G1P1   Current Complaints / other details:  Smokes marajuana and cigarette Vitals:   04/28/18 1302  BP: (!) 172/96  Pulse: 79  Resp: 18  Temp: 98.1 F (36.7 C)  TempSrc: Oral  SpO2: 98%  Weight: 170 lb 3.2 oz (77.2 kg)   Wt Readings from Last 3 Encounters:  04/28/18 170 lb 3.2 oz (77.2 kg)  04/03/18 170 lb 6.7 oz (77.3 kg)  03/27/18 179 lb 11.2 oz (81.5 kg)

## 2018-04-22 ENCOUNTER — Telehealth: Payer: Self-pay | Admitting: Hematology

## 2018-04-22 NOTE — Telephone Encounter (Signed)
Called regarding 11/25

## 2018-04-28 ENCOUNTER — Ambulatory Visit: Admission: RE | Admit: 2018-04-28 | Payer: Medicaid Other | Source: Ambulatory Visit | Admitting: Radiation Oncology

## 2018-04-28 ENCOUNTER — Encounter: Payer: Self-pay | Admitting: Radiation Oncology

## 2018-04-28 ENCOUNTER — Ambulatory Visit
Admission: RE | Admit: 2018-04-28 | Discharge: 2018-04-28 | Disposition: A | Discharge status: Home / Self Care | Payer: Medicaid Other | Source: Ambulatory Visit | Attending: Radiation Oncology | Admitting: Radiation Oncology

## 2018-04-28 ENCOUNTER — Other Ambulatory Visit: Payer: Self-pay

## 2018-04-28 VITALS — BP 172/96 | HR 79 | Temp 98.1°F | Resp 18 | Wt 170.2 lb

## 2018-04-28 DIAGNOSIS — F17210 Nicotine dependence, cigarettes, uncomplicated: Secondary | ICD-10-CM | POA: Diagnosis not present

## 2018-04-28 DIAGNOSIS — Z170 Estrogen receptor positive status [ER+]: Secondary | ICD-10-CM | POA: Insufficient documentation

## 2018-04-28 DIAGNOSIS — C50812 Malignant neoplasm of overlapping sites of left female breast: Secondary | ICD-10-CM | POA: Insufficient documentation

## 2018-04-28 DIAGNOSIS — C50811 Malignant neoplasm of overlapping sites of right female breast: Secondary | ICD-10-CM

## 2018-04-28 DIAGNOSIS — Z79899 Other long term (current) drug therapy: Secondary | ICD-10-CM | POA: Diagnosis not present

## 2018-04-29 NOTE — Progress Notes (Signed)
Radiation Oncology         [phone] ________________________________  Name: Darlene Beasley        MRN: [id number]  Date of Service: 04/28/2018 DOB: 1956/06/08  CC:No primary care provider on file.  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   HISTORY OF PRESENT ILLNESS: Darlene Beasley is a 62 y.o. female originally seen in the multidisciplinary breast clinic for a new diagnosis of bilateral breast cancer. The patient was noted to have crusting of the nipple and a pinching sensation of the right breast.  She went in for diagnostic imaging on 09/09/2017 revealing a large palpable mass on physical examination measuring 7 to 8 cm in size with retraction of the nipple, and concerns for peau d'orange.  Corresponding to the palpable lesion on tomosynthesis revealed a 3 x 5 cm area of focal asymmetry, and diffuse trabecular thickening of the skin throughout the right breast.  A right axillary lymph node was possibly seen as well.  Targeted breast ultrasound revealed a large hypoechoic mass with irregular margins extending from 9-1 o'clock.  It measured approximately 7.5 cm, and separate from the mass at 7:00 with a 1.4 x 2.7 x 1.7 cm mass with irregular margins, at 1:00 there was another mass measuring 1.7 x 0.5 x 1.6 cm, and in the right axilla at least 2 pathologically enlarged lymph nodes measuring 2.5 x 2.5 x 2.8 cm, and 2 x 2.8 x 2.7 cm.  Left breast ultrasound revealed a oval hypoechoic mass with microcalcifications at 2:00 measuring 9 x 5 x 9 mm.  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  The left breast revealed an ER PR positive, HER-2 negative cancer with a Ki-67 of 5%.  Her right-sided biopsy was ER positive PR negative,HER-2 negative, and her lymph  node was also sampled and positive for metastatic carcinoma.  Bilateral breast MRI on 10/07/2017 revealed biopsy changes and clip artifact within the left breast with the biopsy site corresponding to a 9 x 8 x 7 mm lesion in the upper outer quadrant.  In the right breast again there was both skin and trabecular edema with enlargement of the right breast and nipple retraction, the enhancing mass measured 6.8 x 9.6 x 7.2 cm.  It predominantly involves the anterior third and central upper right breast, and 5 morphologically abnormal lymph nodes were seen in the right axilla the largest measuring up to 3.4 cm.  She underwent repeat assessment  for additional biopsy sampling of the right breast, both sites, at 1:00, and 7:00 both revealed morphologically similar grade 2 invasive ductal carcinoma.  She began neoadjuvant chemotherapy with [name] between 10/22/2017 and 10-19.  She also had genetic testing which was negative.  Her posttreatment imaging revealed a 7.6 x 6.7 x 6.4 cm mass in the right breast with 4 abnormal axillary nodes.  There is no significant changes in her nodes from prior.  Her left breast nodule was stable.  She is undergone radical modified mastectomy on the right with left breast lumpectomy and sentinel lymph node biopsy which was performed on 04/03/2018.  Final pathology revealed a 9 mm invasive ductal carcinoma in the left breast with negative margins, and no evidence of cancer in her 4 left axillary nodes that were sampled.  Her right breast revealed an 8.5 cm residual invasive ductal carcinoma with extensive lymphovascular space invasion, carcinoma was noted in 5 of the 15 lymph nodes with extracapsular extension.  Her margins were negative.  She comes today to discuss the role of postmastectomy radiotherapy on the right and radiotherapy to the left breast as well.  PREVIOUS RADIATION THERAPY: No   PAST MEDICAL HISTORY:  Past Medical History:  Diagnosis Date  . Adopted   . Arthritis   .  breast ca dx'd 08/2017  . Genetic testing 11/14/2017   Multi-Cancer panel (83 genes) @ [facility] - Pathogenic mutation in the MITF gene  . GERD (gastroesophageal reflux disease)   . 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 BREAST LUMPECTOMY WITH RADIOACTIVE SEED AND SENTINEL LYMPH NODE BIOPSY Left 04/03/2018   Procedure: LEFT BREAST LUMPECTOMY WITH RADIOACTIVE SEED AND LEFT SENTINEL LYMPH NODE BIOPSY;  Surgeon: [name], MD;  Location: [facility];  Service: General;  Laterality: Left;  . IR IMAGING GUIDED PORT INSERTION  10/20/2017  . IR US GUIDE VASC ACCESS LEFT  10/20/2017  . MASTECTOMY MODIFIED RADICAL Right 04/03/2018   Procedure: MASTECTOMY MODIFIED RADICAL;  Surgeon: [name], MD;  Location: [place];  Service: General;  Laterality: Right;  . PORT-A-CATH REMOVAL N/A 04/03/2018   Procedure: REMOVAL PORT-A-CATH;  Surgeon: [name], MD;  Location: [place];  Service: General;  Laterality: N/A;  . TONSILLECTOMY Bilateral   . TUBAL LIGATION       FAMILY HISTORY:  Family History  Adopted: Yes  Problem Relation Age of Onset  . Congestive Heart Failure Mother   . Congestive Heart Failure Father      SOCIAL HISTORY:  reports that she has been smoking cigarettes. She has a 25.50 pack-year smoking history. She has never used smokeless tobacco. She reports that she has current or past drug history. Drug: Marijuana. She reports that she does not drink alcohol.  The patient is single but in a long-term relationship.  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   ALLERGIES: Bee venom   MEDICATIONS:  Current Outpatient Medications  Medication Sig Dispense Refill  . Ascorbic Acid (VITAMIN C) 1000 MG tablet Take 1,000 mg by mouth 3 (three) times daily.    .  Coenzyme Q10 100 MG capsule Take 100 mg by mouth daily.     [name] docusate sodium (COLACE) 100 MG capsule Take 100 mg by mouth 3 (three) times daily.    . vitamin B-12 (CYANOCOBALAMIN) 1000 MCG tablet Take 1,000 mcg by mouth daily.    [name] HYDROcodone-acetaminophen (NORCO) 5-325 MG tablet Take 1-2 tablets by mouth every 6 (six) hours as needed for moderate pain or severe pain. (Patient not taking: Reported on 04/28/2018) 30 tablet 0  . lidocaine-prilocaine (EMLA) cream Apply to affected area once (Patient not taking: Reported on 04/28/2018) 30 g 3  . prochlorperazine (COMPAZINE) 10 MG tablet Take 1 tablet (10 mg total) by mouth every 6 (six) hours as needed (Nausea or vomiting). (Patient not taking: Reported on 04/28/2018) 60 tablet 1  . vitamin E 100 UNIT capsule Take 100 Units by mouth daily.     No current facility-administered medications for this encounter.      REVIEW OF SYSTEMS: On review of systems, the patient reports that she is doing  well overall.  She feels as though she did extremely well during chemotherapy as well as with her surgery.  She still has 1 drain in place and reports this will be removed next week.  She denies any chest pain, shortness of breath, cough, fevers, chills, night sweats, unintended weight changes. She denies any bowel or bladder disturbances, and denies abdominal pain, nausea or vomiting. She denies any new musculoskeletal or joint aches or pains. A complete review of systems is obtained and is otherwise negative.    PHYSICAL EXAM:  Wt Readings from Last 3 Encounters:  04/28/18 170 lb 3.2 oz (77.2 kg)  04/03/18 170 lb 6.7 oz (77.3 kg)  03/27/18 179 lb 11.2 oz (81.5 kg)   Temp Readings from Last 3 Encounters:  04/28/18 98.1 F (36.7 C) (Oral)  04/04/18 (!) 97.4 F (36.3 C)  03/27/18 98.4 F (36.9 C) (Oral)   BP Readings from Last 3 Encounters:  04/28/18 (!) 172/96  04/04/18 (!) 155/87  03/27/18 (!) 159/87   Pulse Readings from Last 3 Encounters:    04/28/18 79  04/04/18 78  03/27/18 84     In general this is a well appearing caucasian female in no acute distress. She is alert and oriented x4 and appropriate throughout the examination. HEENT reveals that the patient is normocephalic, atraumatic. EOMs are intact. Cardiopulmonary assessment is negative for acute distress and she exhibits normal effort.  Her right chest wall is healing nicely with an intact JP drain.  No evidence of chest wall erythema or edema is identified.  Her left lumpectomy site is also healing well in addition to her axillary incision on the left.   ECOG = 1  0 - Asymptomatic (Fully active, able to carry on all predisease activities without restriction)  1 - Symptomatic but completely ambulatory (Restricted in physically strenuous activity but ambulatory and able to carry out work of a light or sedentary nature. For example, light housework, office work)  2 - Symptomatic, <50% in bed during the day (Ambulatory and capable of all self care but unable to carry out any work activities. Up and about more than 50% of waking hours)  3 - Symptomatic, >50% in bed, but not bedbound (Capable of only limited self-care, confined to bed or chair 50% or more of waking hours)  4 - Bedbound (Completely disabled. Cannot carry on any self-care. Totally confined to bed or chair)  5 - Death   Eustace Pen MM, Creech RH, Tormey DC, et al. [phone]). "Toxicity and response criteria of the [facility]". [place] Oncol. 5 (6): 649-55    LABORATORY DATA:  Lab Results  Component Value Date   WBC 6.9 03/27/2018   HGB 12.4 03/27/2018   HCT 37.0 03/27/2018   MCV 97.1 03/27/2018   PLT 206 03/27/2018   Lab Results  Component Value Date   NA 144 03/27/2018   K 4.1 03/27/2018   CL 110 03/27/2018   CO2 26 03/27/2018   Lab Results  Component Value Date   ALT 16 03/27/2018   AST 16 03/27/2018   ALKPHOS 56 03/27/2018   BILITOT 0.3 03/27/2018       RADIOGRAPHY: Nm Sentinel Node Inj-no Rpt (breast)  Result Date: 04/03/2018 Sulfur colloid was injected by the nuclear medicine technologist for melanoma sentinel node.   Mm Breast Surgical Specimen  Result Date: 04/03/2018 CLINICAL DATA:  Evaluate specimen EXAM: SPECIMEN RADIOGRAPH OF THE LEFT BREAST COMPARISON:  Previous exam(s). FINDINGS: Status post excision of the  left breast. The radioactive seed and biopsy marker clip are present, completely intact, and were marked for pathology. IMPRESSION: Specimen radiograph of the left breast. Electronically Signed   By: Dorise Bullion III M.D   On: 04/03/2018 09:20   Mm Lt Radioactive Seed Loc Mammo Guide  Result Date: 04/02/2018 CLINICAL DATA:  Patient presents for radioactive seed localization of a small left breast carcinoma prior to surgical excision. EXAM: MAMMOGRAPHIC GUIDED RADIOACTIVE SEED LOCALIZATION OF THE LEFT BREAST COMPARISON:  Previous exam(s). FINDINGS: Patient presents for radioactive seed localization prior to surgical excision. I met with the patient and we discussed the procedure of seed localization including benefits and alternatives. We discussed the high likelihood of a successful procedure. We discussed the risks of the procedure including infection, bleeding, tissue injury and further surgery. We discussed the low dose of radioactivity involved in the procedure. Informed, written consent was given. The usual time-out protocol was performed immediately prior to the procedure. Using mammographic guidance, sterile technique, 1% lidocaine and an I-125 radioactive seed, the small mass and ribbon shaped biopsy clip were localized using a lateral approach. The follow-up mammogram images confirm the seed in the expected location and were marked for Dr. Renelda Loma. Follow-up survey of the patient confirms presence of the radioactive seed. Order number of I-125 seed:  [id number]. Total activity:  9.528 millicuries reference Date: 02/20/2018  The patient tolerated the procedure well and was released from the [facility]. She was given instructions regarding seed removal. IMPRESSION: Radioactive seed localization of the left breast. No apparent complications. Electronically Signed   By: Lajean Manes M.D.   On: 04/02/2018 13:57       IMPRESSION/PLAN: 1. Stage IIIA, cT3N1M0, grade 2, ER positive, invasive ductal carcinoma of the right breast with synchronous  Stage IA, cT1bN0M0, grade 1 ER/PR positive invasive ductal carcinoma of the left breast with residual disease in both the left and right breast, and persistent carcinoma in the right axilla. Dr. Lisbeth Renshaw discusses the final results of the patient's pathology, and outlines the rationale for adjuvant treatment for bilateral breast disease. We discussed the risks, benefits, short, and long term effects of radiotherapy, and the patient is interested in proceeding. Dr. Lisbeth Renshaw discusses the delivery and logistics of radiotherapy and anticipates a course of 6 1/2 weeks of radiotherapy to the left breast, right chest wall and right regional lymph nodes.  She is healing nicely but is not quite ready for planning process today.  We will reschedule this in approximately 2 weeks time. Written consent is obtained and placed in the chart, a copy was provided to the patient.   In a visit lasting 30 minutes, greater than 50% of the time was spent face to face discussing her case, and coordinating the patient's care.   The above documentation reflects my direct findings during this shared patient visit. Please see the separate note by Dr. Lisbeth Renshaw on this date for the remainder of the patient's plan of care.    Carola Rhine, PAC

## 2018-05-02 NOTE — Progress Notes (Signed)
[name]  [phone] [phone]  Clinic Follow up Note   Patient Care Team: [name], MD as PCP - General (Family Medicine) [name], MD as Consulting Physician (General Surgery) [name], MD as Consulting Physician (Hematology) 05/04/2018    Chief Complaint: F/u on left breast cancer   SUMMARY OF ONCOLOGIC HISTORY: Oncology History   Cancer Staging Cancer of overlapping sites of right female breast [facility]) Staging form: Breast, AJCC 8th Edition - Clinical stage from 09/23/2017: Stage IIIA (cT3, cN1, cM0, G2, ER+, PR-, HER2-) - Signed by [name], MD on 10/09/2017 - Pathologic stage from 04/03/2018: No Stage Recommended (ypT3, pN2a, cM0, G3, ER+, PR-, HER2-) - Signed by [name], MD on 05/03/2018  Malignant neoplasm of left breast in female, estrogen receptor positive ([place]) Staging form: Breast, AJCC 8th Edition - Clinical: Stage IA (cT1b, cN0, cM0, G1, ER+, PR+, HER2-) - Unsigned - Pathologic stage from 04/03/2018: No Stage Recommended (ypT1b, pN0, cM0, G1, ER+, PR+, HER2-) - Signed by [name], MD on 05/03/2018       Cancer of overlapping sites of right female breast ([place])   09/09/2017 Mammogram    IMPRESSION: 1. Highly suspicious large right breast mass involving the outer and upper breast extending from approximately 9 o'clock through 12-1 o'clock, maximum measurement approximating 7.5 cm. The mass involves the right nipple, accounting for nipple retraction. Architectural distortion, microcalcifications and diffuse trabecular thickening and skin thickening are associated with the large mass, raising the possibility of this representing an inflammatory cancer. 2. Satellite masses separate from the dominant mass involving the lower outer quadrant and the upper inner quadrant of the right breast, measured above. 3. Two adjacent pathologic right axillary lymph nodes. 4. Indeterminate 0.9 cm solid mass involving the upper  outer quadrant of the left breast which accounts for a mammographic finding. 5. No pathologic left axillary lymphadenopathy.    09/09/2017 Breast US    Targeted right breast ultrasound is performed, showing a very large hypoechoic mass with irregular margins extending from the approximate 9 o'clock position through the 12 to 1 o'clock position. On the CC gait image, the mass measures maximally approximately 7.5 cm. The mass has irregular margins, demonstrates acoustic shadowing, and demonstrates internal power Doppler flow. The mass extends into the retroareolar region and involves the nipple, accounting for the nipple retraction.  Separate from the dominant mass at the 7 o'clock position approximately 4 cm from nipple is a hypoechoic antiparallel mass with irregular margins measuring approximately 1.4 x 2.7 x 1.7 cm.  Separate from the dominant mass at the 1 o'clock position approximately 2 cm from nipple is a hypoechoic mass with irregular margins measuring approximately 1.7 x 0.5 x 1.6 cm. Both of these masses also demonstrate internal power Doppler flow.  Sonographic evaluation of the right axilla demonstrates 2 adjacent pathologic lymph nodes, the larger measuring approximately 2.5 x 2.5 x 2.8 cm, the smaller measuring approximately 2.0 x 0.8 x 2.7 cm.    09/23/2017 Initial Biopsy    Diagnosis 1. Breast, left, needle core biopsy, 2 o'clock - INVASIVE DUCTAL CARCINOMA. - SEE COMMENT. 2. Breast, right, needle core biopsy, 11:30 o'clock - INVASIVE MAMMARY CARCINOMA. - SEE COMMENT. 3. Lymph node, needle/core biopsy, right axilla - METASTATIC MAMMARY CARCINOMA IN 1 OF 1 LYMPH NODE (1/1). Microscopic Comment 1. There is a small focus of grade I invasive ductal carcinoma. A complete breast prognostic profile will be attempted and the results reported separately. The results were called  to the [facility] on 09/24/2017. 2. The carcinoma appears grade II. An E-Cadherin stain and a  breast prognostic profile will be performed on part 2 and the results reported separately. (JBK:kh 09-24-17) Darlene Beasley  1. HER2 Negative by FISH Estrogen receptor: 100% positive, strong staining Progesterone receptor: 80% positive, strong staining Ki67: 5%  2. HER2 Negative by FISH Estrogen receptor: 100% positive, strong staining Progesterone receptor: 0%, negative  Ki67: 30% 2. The tumor cells are strongly positive for E-cadherin, supporting a ductal phenotype    09/23/2017 Cancer Staging    Staging form: Breast, AJCC 8th Edition - Clinical stage from 09/23/2017: Stage IIIA (cT3, cN1, cM0, G2, ER+, PR-, HER2-) - Signed by [name], MD on 10/09/2017    10/07/2017 Breast MRI    IMPRESSION: 1. Biopsy proven malignancy involving all 4 quadrants of the right breast, although predominantly located in the anterior third of the central upper right breast measures 6.8 x 9.6 x 7.2 cm.  2. Five pathologic right axillary lymph nodes, compatible with biopsy proven axillary metastases.  3. Small mass with associated biopsy related changes in the upper-outer left breast at site of known malignancy measures 0.9 x 0.8 x 0.7 cm. No abnormal lymph nodes seen in the left axilla.  RECOMMENDATION: Treatment plan for known bilateral breast malignancy.  BI-RADS CATEGORY  6: Known biopsy-proven malignancy.    10/08/2017 Initial Diagnosis    Cancer of overlapping sites of right female breast ([place])    10/13/2017 Pathology Results    Diagnosis 1. Breast, right, needle core biopsy, 1 o'clock - INVASIVE DUCTAL CARCINOMA. - SEE COMMENT. 2. Breast, right, needle core biopsy, 7 o'clock - INVASIVE DUCTAL CARCINOMA. - SEE COMMENT. Microscopic Comment 1. and 2. The carcinoma in parts 1 and 2 is morphologically similar and appears grade II. Because breast prognostic profiles were performed on the patients previous case, [id number], it will not be repeated on the current case unless requested.      10/17/2017 Imaging    CT CAP IMPRESSION: 1. Infiltrative right breast mass with overlying skin thickening is identified compatible with known breast cancer. 2. Enlarged right axillary lymph nodes compatible with metastatic adenopathy. 3. No additional sites of disease identified within the chest and no evidence for metastasis to the abdomen or pelvis. 4. Aortic atherosclerosis and 3 vessel coronary artery atherosclerotic calcifications. Aortic Atherosclerosis (ICD10-I70.0).    10/17/2017 Imaging    Bone Scan FINDINGS: There are no foci of increased or decreased radiotracer uptake to suggest osseous metastatic disease. 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 There is degenerative type uptake within both shoulders and the left ankle.  Normal physiologic activity is identified within the kidneys and urinary bladder.  IMPRESSION: No evidence of osseous metastatic disease.    10/22/2017 - 03/11/2018 Chemotherapy    AC q2 weeks x4 cycles starting 10/22/17-12/03/17, followed by weekly taxol x12 12/17/17-03/11/18    11/14/2017 Genetic Testing    The genes that were analyzed were the 83 genes on [facility]'s Multi-Cancer panel (ALK, APC, ATM, AXIN2, BAP1, BARD1, BLM, BMPR1A, BRCA1, BRCA2, BRIP1, CASR, CDC73, CDH1, CDK4, CDKN1B, CDKN1C, CDKN2A, CEBPA, CHEK2, CTNNA1, DICER1, DIS3L2, EGFR, EPCAM, FH, FLCN, GATA2, GPC3, GREM1, HOXB13, HRAS, KIT, MAX, MEN1, MET, MITF, MLH1, MSH2, MSH3, MSH6, MUTYH, NBN, NF1, NF2, NTHL1, PALB2, PDGFRA, PHOX2B, PMS2, POLD1, POLE, POT1, PRKAR1A, PTCH1, PTEN, RAD50, RAD51C, RAD51D, RB1, RECQL4, RET, RUNX1, SDHA, SDHAF2, SDHB, SDHC, SDHD, SMAD4,  SMARCA4, SMARCB1, SMARCE1, STK11, SUFU, TERC, TERT, TMEM127, TP53, TSC1, TSC2, VHL, WRN, WT1).  Testingrevealed a pathogenic mutation in the MITF genecalled c.952G>A (p.Glu318Lys) and A Variant of Unknown Significance (VUS) was also detected:  POLD1 c.301A>T (p.Ile101Phe).    12/16/2017 Breast US    IMPRESSION: 1. Stable to minimal decrease in size of multiple irregular masses throughout the right breast consistent with the patient's sites of biopsy proven malignancy. While the mammographic appearance is slightly less dense in this region, there is increased skin and trabecular thickening involving the entire right breast. 2. Stable appearance of morphologically abnormal right axillary lymph nodes, consistent with biopsy proven metastatic disease. 3. Stable appearance of a left breast mass, consistent with biopsy proven malignancy.     04/03/2018 Cancer Staging    Staging form: Breast, AJCC 8th Edition - Pathologic stage from 04/03/2018: No Stage Recommended (ypT3, pN2a, cM0, G3, ER+, PR-, HER2-) - Signed by [name], MD on 05/03/2018     Malignant neoplasm of left breast in female, estrogen receptor positive ([place])   09/09/2017 Breast US    Targeted left breast ultrasound is performed, showing an oval parallel hypoechoic mass containing microcalcifications at the 2 o'clock position approximately 6 cm from nipple measuring approximately 0.9 x 0.5 x 0.9 cm, corresponding to the mammographic finding.  Sonographic evaluation of the left axilla demonstrates no pathologic lymphadenopathy.    09/23/2017 Initial Biopsy    Diagnosis 1. Breast, left, needle core biopsy, 2 o'clock - INVASIVE DUCTAL CARCINOMA. - SEE COMMENT. 2. Breast, right, needle core biopsy, 11:30 o'clock - INVASIVE MAMMARY CARCINOMA. - SEE COMMENT. 3. Lymph node, needle/core biopsy, right axilla - METASTATIC MAMMARY CARCINOMA IN 1 OF 1 LYMPH NODE (1/1). Microscopic Comment 1. There is a small focus of grade I invasive ductal carcinoma. A complete breast prognostic profile will be attempted and the results reported separately. The results were called to the [facility] on 09/24/2017. 2. The carcinoma appears grade II. An E-Cadherin stain and a breast  prognostic profile will be performed on part 2 and the results reported separately. (JBK:kh 09-24-17) Darlene Beasley  1. HER2 Negative by FISH Estrogen receptor: 100% positive, strong staining Progesterone receptor: 80% positive, strong staining Ki67: 5%  2. HER2 Negative by FISH Estrogen receptor: 100% positive, strong staining Progesterone receptor: 0%, negative  Ki67: 30%    10/08/2017 Initial Diagnosis    Malignant neoplasm of left breast in female, estrogen receptor positive ([place])    12/16/2017 Breast US    IMPRESSION: 3. Stable appearance of a left breast mass, consistent with biopsy proven malignancy.     01/22/2018 Imaging    01/22/2018 MRI Bilateral Breast IMPRESSION: 1. Dominant enhancing mass with surrounding nodularity involving the central anterior third of the right breast now measures up to 6.4 x 6.7 x 7.6 cm (previously measured 6.8 x 9.6 x 7.2 cm). Persistent bulky right axillary lymphadenopathy, slightly decreased in size since prior MRI.  2. Biopsy proven left breast malignancy now measures 0.5 cm (previously measured 0.9 x 0.8 x 0.7 cm).     04/03/2018 Pathology Results    Diagnosis 1. Breast, lumpectomy, left with radioactive seed - RESIDUAL INVASIVE DUCTAL CARCINOMA STATUS POST NEOADJUVANT THERAPY (0.9 CM) - MARGINS UNINVOLVED BY CARCINOMA (0.2 CM; INFERIOR MARGIN) - CALCIFICATIONS ASSOCIATED WITH CARCINOMA - PREVIOUS BIOPSY SITE CHANGES PRESENT - SEE ONCOLOGY TABLE AND COMMENT BELOW 2. Lymph node, sentinel, biopsy, left axillary #1 - NO CARCINOMA IDENTIFIED IN ONE LYMPH NODE (0/1) - SEE COMMENT 3.  Lymph node, sentinel, biopsy, left axillary #2 - NO CARCINOMA IDENTIFIED IN ONE LYMPH NODE (0/1) - SEE COMMENT 4. Lymph node, sentinel, biopsy, left - NO CARCINOMA IDENTIFIED IN ONE LYMPH NODE (0/1) - SEE COMMENT 5. Lymph node, sentinel, biopsy, left axillary #3 - NO CARCINOMA IDENTIFIED IN ONE LYMPH NODE (0/1) - SEE COMMENT 6. Breast, modified radical  mastectomy , right - RESIDUAL INVASIVE DUCTAL CARCINOMA STATUS POST NEOADJUVANT THERAPY (8.5 CM) - EXTENSIVE LYMPHOVASCULAR SPACE INVASION PRESENT - METASTATIC CARCINOMA INVOLVING FIVE OF FIFTEEN LYMPH NODES WITH EXTRACAPSULAR EXTENSION (5/15) - MARGINS UNINVOLVED BY CARCINOMA - SEE ONCOLOGY TABLE AND COMMENT BELOW    04/03/2018 Receptors her2    The tumor cells are NEGATIVE for Her2 (1+). Estrogen Receptor: 95%, POSITIVE, STRONG STAINING INTENSITY Progesterone Receptor: 95%, POSITIVE, STRONG STAINING INTENSITY  The tumor cells are NEGATIVE for Her2 (1+). Estrogen Receptor: 100%, POSITIVE, STRONG STAINING INTENSITY Progesterone Receptor: 0%, NEGATIVE    04/03/2018 Cancer Staging    Staging form: Breast, AJCC 8th Edition - Pathologic stage from 04/03/2018: No Stage Recommended (ypT1b, pN0, cM0, G1, ER+, PR+, HER2-) - Signed by [name], MD on 05/03/2018     CURRENT THERAPY: pending adjuvant Radiation   INTERVAL HISTORY: Hyacinth Marcelli is a 61 y.o. female who is here for follow-up. She has left breast lumpectomy with Dr. Dalbert Batman on 04/03/2018. She also saw Dr. Lisbeth Renshaw for radiation therapy on 04/28/2018. Today, she is here alone. She has recovered well from surgery. She denies pain and has good ROM of her arms. She noticed LL edema, worse on her left leg, with redness with mild itching after surgery. She uses cortisone cream that provides mild relief. The takes [place] and multivitamins.   Pertinent positives and negatives of review of systems are listed and detailed within the above HPI.   REVIEW OF SYSTEMS:   Constitutional: Denies fevers, chills or abnormal weight loss Eyes: Denies blurriness of vision Ears, nose, mouth, throat, and face: Denies mucositis or sore throat Respiratory: Denies cough, dyspnea or wheezes Cardiovascular: Denies palpitation, chest discomfort (+) LL edema, worse on left side with redness and itchiness  Gastrointestinal:  Denies nausea, heartburn or change  in bowel habits Skin: Denies abnormal skin rashes Lymphatics: Denies new lymphadenopathy or easy bruising Neurological:Denies numbness, tingling or new weaknesses Behavioral/Psych: Mood is stable, no new changes  BREAST: (+) left breast lumpectomy, healing well All other systems were reviewed with the patient and are negative.  MEDICAL HISTORY:  Past Medical History:  Diagnosis Date  . Adopted   . Arthritis   . breast ca dx'd 08/2017  . Genetic testing 11/14/2017   Multi-Cancer panel (83 genes) @ [facility] - Pathogenic mutation in the MITF gene  . GERD (gastroesophageal reflux disease)   . 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 BREAST LUMPECTOMY WITH RADIOACTIVE SEED AND SENTINEL LYMPH NODE BIOPSY Left 04/03/2018   Procedure: LEFT BREAST LUMPECTOMY WITH RADIOACTIVE SEED AND LEFT SENTINEL LYMPH NODE BIOPSY;  Surgeon: [name], MD;  Location: [facility];  Service: General;  Laterality: Left;  . IR IMAGING GUIDED PORT INSERTION  10/20/2017  . IR US GUIDE VASC ACCESS LEFT  10/20/2017  . MASTECTOMY MODIFIED RADICAL Right 04/03/2018   Procedure: MASTECTOMY MODIFIED RADICAL;  Surgeon: [name], MD;  Location: [facility];  Service: General;  Laterality: Right;  . PORT-A-CATH REMOVAL N/A 04/03/2018  Procedure: REMOVAL PORT-A-CATH;  Surgeon: [name], MD;  Location: [facility];  Service: General;  Laterality: N/A;  . TONSILLECTOMY Bilateral   . TUBAL LIGATION      I have reviewed the social history and family history with the patient and they are unchanged from previous note.  ALLERGIES:  is allergic to bee venom.  MEDICATIONS:  Current Outpatient Medications  Medication Sig Dispense Refill  . Ascorbic Acid (VITAMIN C) 1000 MG tablet Take 1,000 mg by mouth 3 (three) times daily.    .  Coenzyme Q10 100 MG capsule Take 100 mg by mouth daily.     [name] docusate sodium (COLACE) 100 MG capsule Take 100 mg by mouth 3 (three) times daily.    . prochlorperazine (COMPAZINE) 10 MG tablet Take 1 tablet (10 mg total) by mouth every 6 (six) hours as needed (Nausea or vomiting). 60 tablet 1  . vitamin B-12 (CYANOCOBALAMIN) 1000 MCG tablet Take 1,000 mcg by mouth daily.    . vitamin E 100 UNIT capsule Take 100 Units by mouth daily.    [name] HYDROcodone-acetaminophen (NORCO) 5-325 MG tablet Take 1-2 tablets by mouth every 6 (six) hours as needed for moderate pain or severe pain. (Patient not taking: Reported on 04/28/2018) 30 tablet 0  . lidocaine-prilocaine (EMLA) cream Apply to affected area once (Patient not taking: Reported on 05/04/2018) 30 g 3   No current facility-administered medications for this visit.     PHYSICAL EXAMINATION: ECOG PERFORMANCE STATUS: 1 - Symptomatic but completely ambulatory  Vitals:   05/04/18 0914 05/04/18 0916  BP: (!) 172/100 (!) 156/95  Pulse: 82   Resp: 18   Temp: 98 F (36.7 C)   SpO2: 98%    Filed Weights   05/04/18 0914  Weight: 174 lb 4.8 oz (79.1 kg)    GENERAL:alert, no distress and comfortable SKIN: skin color, texture, turgor are normal, or significant lesions (+) red itchy rash on her left LL EYES: normal, Conjunctiva are pink and non-injected, sclera clear OROPHARYNX:no exudate, no erythema and lips, buccal mucosa, and tongue normal  NECK: supple, thyroid normal size, non-tender, without nodularity LYMPH:  no palpable lymphadenopathy in the cervical, axillary or inguinal LUNGS: clear to auscultation and percussion with normal breathing effort HEART: regular rate & rhythm and no murmurs (+) bilateral LL edema ABDOMEN:abdomen soft, non-tender and normal bowel sounds (+) pump installed Breast: (+) left breast lumpectomy and right mastectomy, all incisions are healing well without discharge or skin erythema Musculoskeletal:no cyanosis of  digits and no clubbing  NEURO: alert & oriented x 3 with fluent speech, no focal motor/sensory deficits  LABORATORY DATA:  I have reviewed the data as listed CBC Latest Ref Rng & Units 03/27/2018 03/11/2018 03/04/2018  WBC 4.0 - 10.5 K/uL 6.9 6.5 5.5  Hemoglobin 12.0 - 15.0 g/dL 12.4 12.7 12.5  Hematocrit 36.0 - 46.0 % 37.0 37.7 36.9  Platelets 150 - 400 K/uL 206 215 211     CMP Latest Ref Rng & Units 03/27/2018 03/11/2018 03/04/2018  Glucose 70 - 99 mg/dL 92 92 97  BUN 8 - 23 mg/dL 19 25(H) 18  Creatinine 0.44 - 1.00 mg/dL 0.72 0.69 0.67  Sodium 135 - 145 mmol/L 144 142 142  Potassium 3.5 - 5.1 mmol/L 4.1 3.9 3.9  Chloride 98 - 111 mmol/L 110 109 107  CO2 22 - 32 mmol/L '26 26 28  '$ Calcium 8.9 - 10.3 mg/dL 8.8(L) 8.8(L) 9.2  Total Protein 6.5 - 8.1 g/dL 5.9(L) 5.9(L) 6.0(L)  Total Bilirubin 0.3 - 1.2 mg/dL 0.3 <0.2(L) <0.2(L)  Alkaline Phos 38 - 126 U/L 56 50 51  AST 15 - 41 U/L '16 17 16  '$ ALT 0 - 44 U/L '16 19 19    '$ PATHOLOGY  04/03/2018 Surgical Pathology ADDITIONAL INFORMATION: 1. PROGNOSTIC INDICATORS Results: IMMUNOHISTOCHEMICAL AND MORPHOMETRIC ANALYSIS PERFORMED MANUALLY The tumor cells are NEGATIVE for Her2 (1+). Estrogen Receptor: 95%, POSITIVE, STRONG STAINING INTENSITY Progesterone Receptor: 95%, POSITIVE, STRONG STAINING INTENSITY REFERENCE RANGE ESTROGEN RECEPTOR NEGATIVE 0% POSITIVE =>1% REFERENCE RANGE PROGESTERONE RECEPTOR NEGATIVE 0% POSITIVE =>1% All controls stained appropriately 6. PROGNOSTIC INDICATORS Results: IMMUNOHISTOCHEMICAL AND MORPHOMETRIC ANALYSIS PERFORMED MANUALLY The tumor cells are NEGATIVE for Her2 (1+). Estrogen Receptor: 100%, POSITIVE, STRONG STAINING INTENSITY Progesterone Receptor: 0%, NEGATIVE COMMENT: The negative hormone receptor study(ies) in this case has An internal positive control. REFERENCE RANGE ESTROGEN RECEPTOR NEGATIVE 0% POSITIVE =>1% REFERENCE RANGE PROGESTERONE RECEPTOR NEGATIVE 0% POSITIVE =>1% All controls  stained appropriately Diagnosis 1. Breast, lumpectomy, left with radioactive seed - RESIDUAL INVASIVE DUCTAL CARCINOMA STATUS POST NEOADJUVANT THERAPY (0.9 CM) - MARGINS UNINVOLVED BY CARCINOMA (0.2 CM; INFERIOR MARGIN) - CALCIFICATIONS ASSOCIATED WITH CARCINOMA - PREVIOUS BIOPSY SITE CHANGES PRESENT - SEE ONCOLOGY TABLE AND COMMENT BELOW 2. Lymph node, sentinel, biopsy, left axillary #1 - NO CARCINOMA IDENTIFIED IN ONE LYMPH NODE (0/1) - SEE COMMENT 3. Lymph node, sentinel, biopsy, left axillary #2 - NO CARCINOMA IDENTIFIED IN ONE LYMPH NODE (0/1) - SEE COMMENT 4. Lymph node, sentinel, biopsy, left - NO CARCINOMA IDENTIFIED IN ONE LYMPH NODE (0/1) - SEE COMMENT 5. Lymph node, sentinel, biopsy, left axillary #3 - NO CARCINOMA IDENTIFIED IN ONE LYMPH NODE (0/1) - SEE COMMENT 6. Breast, modified radical mastectomy , right - RESIDUAL INVASIVE DUCTAL CARCINOMA STATUS POST NEOADJUVANT THERAPY (8.5 CM) - EXTENSIVE LYMPHOVASCULAR SPACE INVASION PRESENT - METASTATIC CARCINOMA INVOLVING FIVE OF FIFTEEN LYMPH NODES WITH EXTRACAPSULAR EXTENSION (5/15) - MARGINS UNINVOLVED BY CARCINOMA - SEE ONCOLOGY TABLE AND COMMENT BELOW Microscopic Comment 1. INVASIVE CARCINOMA OF THE BREAST: STATUS POST NEOADJUVANT TREATMENT Resection Procedure: Excision Specimen Laterality: Left Tumor Size: 0.9 cm Histologic Type: Invasive carcinoma of no special type (ductal, not otherwise specified) Histologic Grade: Glandular (Acinar)/Tubular Differentiation: Score 2 Nuclear Pleomorphism: Score 1 Mitotic Rate: Score 1 Overall Grade: Nottingham Grade 1 Ductal Carcinoma In Situ: Not identified Margins: Uninvolved by carcinoma Distance from closest margin (millimeters): 2 Specify closest margin (required only if <85m): Inferior margin Regional Lymph Nodes: Number of Lymph Nodes Examined: 4 Number of Sentinel Nodes Examined (if applicable): 4 Number of Lymph Nodes with Macrometastases (>2 mm): 0 Number of Lymph  Nodes with Micrometastases: 0 Number of Lymph Nodes with Isolated Tumor Cells (?0.2 mm or ?200 cells)#: 0 Size of Largest Metastatic Deposit (millimeters): N/A Extranodal Extension: N/A Treatment Effect in the Breast: Probable or definite response to presurgical therapy in the invasive carcinoma Treatment Effect in the Lymph Nodes: No lymph node metastases and no prominent fibrous scarring in the nodes Breast Prognostic Profile (pre-neoadjuvant case #: [id number] Estrogen Receptor: Positive (100%, strong) Progesterone Receptor: Positive (80%, strong) Her2: Negative (Ratio 1.22) Ki-67: 5% Will be repeated on the current case (Block #: 1A) and the results reported separately. Residual Cancer Burden (RCB): Primary Tumor Bed: 9 mm x 8 mm Overall Cancer Cellularity: 10 Percentage of Cancer that is in Situ: 0 Number of Positive Lymph Nodes: 0 Diameter of Largest Lymph Node metastasis: N/A Residual Cancer Burden : 1.361 Residual Cancer Burden Class: RCB-II Representative tumor block: 1A Pathologic Stage Classification (pTNM,  AJCC 8th Edition): ypT1b, ypN0 (v4.2.0.0) COMMENT: E-cadherin and cytokeratin AE1/3 were performed on a representative section of tumor; E-cadherin is positive supporting a ductal origin. Given the lobular-like morphology cytokeratin AE1/3 was performed on the lymph nodes to exclude nodal metastasis. Cytokeratin is negative supporting the absence of metastatic carcinoma. 6. INVASIVE CARCINOMA OF THE BREAST: STATUS POST NEOADJUVANT TREATMENT Resection Procedure: Total mastectomy Specimen Laterality: Right Tumor Size: 8.5 cm Histologic Type: Invasive carcinoma of no special type (ductal, not otherwise specified) Histologic Grade: Glandular (Acinar)/Tubular Differentiation: Score 3 Nuclear Pleomorphism: Score 3 Mitotic Rate: Score 2 Overall Grade: Nottingham Grade 3 Ductal Carcinoma In Situ: Not identified Tumor Extension: Skin: Invasive carcinoma directly  invades into the dermis or epidermis without skin ulceration Margins: Uninvolved by carcinoma Distance from closest margin (millimeters): Posterior (deep) Specify closest margin (required only if <48m): 3 cm DCIS Margins: N/A Regional Lymph Nodes: Number of Lymph Nodes Examined: 15 Number of Sentinel Nodes Examined (if applicable): 0 Number of Lymph Nodes with Macrometastases (>2 mm): 5 Number of Lymph Nodes with Micrometastases: 0 Number of Lymph Nodes with Isolated Tumor Cells (?0.2 mm or ?200 cells)#: N/A Size of Largest Metastatic Deposit (millimeters): 20 mm Extranodal Extension: Present (>264m Treatment Effect in the Breast: Probable or definite response to presurgical therapy in the invasive carcinoma Treatment Effect in the Lymph Nodes: Probable or definite response to presurgical therapy in metastatic carcinoma Breast Prognostic Profile (pre-neoadjuvant case #: [id number] Receptor: Positive (100%, strong) Progesterone Receptor: Negative Her2: Negative (Ratio 1.35) Ki-67: 30% Will be repeated on the current case (Block #: 6G ) and the results reported separately. Residual Cancer Burden (RCB): Primary Tumor Bed: 85 mm x 65 mm Overall Cancer Cellularity: 60 Percentage of Cancer that is in Situ: 0 Number of Positive Lymph Nodes: 5 Diameter of Largest Lymph Node metastasis: 20 mm Residual Cancer Burden : 4.682 Residual Cancer Burden Class: RCB-III Representative tumor block: 6G Pathologic Stage Classification (pTNM, AJCC 8th Edition): ypT3, ypN2a (v4.2.0.0) COMMENT: E-cadherin and cytokeratin AE1/3 were repeated on representative section of tumor; E-cadherin is positive supporting a ductal origin. There were 3 biopsy clips present in the mastectomy specimen. Grossly, these separately biopsied lesions represented one contiguous mass measuring 8.5 cm in greatest dimension. There is minimal response to neoadjuvant chemotherapy noted. Invasive carcinoma involves the  dermis with dermal lymphatic involvement. Of note, one of the fifteen lymph nodes showed tumor within lymphatics of perinodal fat but not within the lymph node proper. Specimen(s) Obtained: 1. Breast, lumpectomy, left with radioactive seed 2. Lymph node, sentinel, biopsy, left axillary #1 3. Lymph node, sentinel, biopsy, left axillary #2 4. Lymph node, sentinel, biopsy, left 5. Lymph node, sentinel, biopsy, left axillary #3 6. Breast, modified radical mastectomy , right Specimen Clinical Information 1. bilateral breast cancer (cm) Gross 1. Specimen type: Left breast seed localization lumpectomy, received fresh. The specimen is placed in formalin at 11:45 a.m. on 04/03/18. Size: 6.2 cm at the superior-inferior axis, 5.1 cm at the lateral-medial axis and 3.2 cm at the anterior-posterior axis. Orientation: The specimen is oriented with previously applied inks: Anterior green, inferior blue, lateral orange, medial yellow, posterior black, superior red. Localized area: There are pins inserted in the specimen identifying a biopsy clip and localization seed. The localization seed is identified at the time of receipt. Cut surface: At the localized area there is a 0.9 x 0.8 x 0.6 cm firm white nodule which has ill-defined borders. There is a ribbon-shaped biopsy clip present at the nodule. The remainder  of the breast tissue consists of soft yellow adipose tissue and white fibrous tissue. Margins: The nodule is located closest to the anterior margin which measures 0.6 cm. The remaining margins measure greater than 1 cm. Prognostic indicators: Obtain from paraffin blocks if needed. Block summary: Six blocks submitted. A-C = entire nodule to include the anterior margin D = posterior margin E = lateral and medial margins F = superior and inferior margins. 2. Rapid Intraoperative Consult performed (Yes or No): No. Specimen: Left axillary sentinel lymph node, received fresh. Number and size: One,  1.1 x 1.0 x 0.6 cm. Cut Surface(s): Tan-yellow to blue-tinged. Block Summary: The lymph node is bisected and entirely submitted in one cassette. 3. Rapid Intraoperative Consult performed (Yes or No): No. Specimen: Left axillary sentinel lymph node, received fresh. Number and size: One, 1.0 x 0.7 x 0.6 cm. Cut Surface(s): Tan-yellow. Block Summary: The lymph node is bisected and entirely submitted in one cassette. 4. Rapid Intraoperative Consult performed (Yes or No): No. Specimen: Left axillary sentinel lymph node, received fresh. Number and size: One, 0.5 x 0.5 x 0.5 Cut Surface(s): Tan-yellow to blue-tinged. Block Summary: The lymph node is bisected and entirely submitted in one cassette. 5. Rapid Intraoperative Consult performed (Yes or No): No. Specimen: Left axillary sentinel lymph node, received fresh. Number and size: One, 0.4 x 0.4 x 0.3 cm. Cut Surface(s): Tan-yellow. Block Summary: The lymph node is bisected and entirely submitted in one cassette. 6. Specimen: Right modified radical mastectomy, received fresh. The specimen is placed in formalin at 11:50 a.m. on 04/03/18. Specimen integrity (intact/disrupted): Intact. Weight: 1102 grams. Size: The breast measures 28.5 x 22.4 x 6.7 cm, and the axillary contents measure 6 x 6 x 2.5 cm. Skin: There is an attached 24 x 13.5 cm ellipse of skin with a central slightly retracted nipple. The skin surrounding the areola is dimpled. There is a suture attached marking the lateral edge. Tumor/cavity: In the central portion of the breast there is a multinodular tan-white mass which extends inferior, superior and lateral to the nipple involving an area measuring 8.5 x 6.5 x 4.5 cm. The specimen is radiographed and three biopsy clips are present in the mass. The mass is located 3 cm from the deep margin and extends to the retracted nipple. Sectioning through the mass shows multiple contiguous nodules and the nodules grossly appear to consist  of a single mass. Uninvolved parenchyma: The remainder of the breast tissue consists of soft yellow adipose tissue and focally dense white fibrous tissue. Prognostic indicators: Obtain from paraffin blocks if needed. Lymph nodes: There are sixteen rubbery to firm ovoid nodules grossly consistent with lymph nodes measuring 0.5 to 2.7 cm in greatest dimension. Some of the lymph nodes show replacement by tumor. Block summary: Twenty-two blocks submitted. A = nipple B = section of skin adjacent to areola C = medial edge of tumor D = E = tumor subjacent to nipple F = central section of tumor G = superior aspect of central tumor H = inferior aspect of central tumor I = lateral edge of tumor J = upper lateral K = lower lateral L = upper medial M = lower medial N = four whole nodes O = five whole nodes P-U = one sectioned node each V = section from one node. (GP:ah 04/04/18) Stain(s) used in Diagnosis: The following stain(s) were used in diagnosing the case: E-CAD, PR-ACIS, Her2 by IHC, CK AE1AE3, ER-ACIS. The control(s) stained appropriately.  RADIOGRAPHIC STUDIES: I have personally  reviewed the radiological images as listed and agreed with the findings in the report. No results found.   ASSESSMENT & PLAN:  Darlene Beasley is a 62 y.o. female with history of  1. 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 She had minimum clinical response  -She had left breast lumpectomy and SLN biopsy, and right mastectomy and axillary lymph node dissection with Dr. Dalbert Batman on  04/03/2018. -Reviewed her surgical pathology findings with patient in detail today, she unfortunately had minimal response to chemo in the right breast cancer, and partial response to the left breast cancer.  All surgical margins were negative. -She saw Dr. Lisbeth Renshaw for radiation therapy on 04/28/2018, will start bilateral adjuvant radaition  -She is recovering well from bilateral breast surgeries and has good ROM of arms and shoulders, I encouraged her to see physical therapist -Due to her minimal response to chemotherapy, I did not recommend adjuvant chemotherapy. --Given the strong ER and PR positivity, I do recommend adjuvant aromatase inhibitor to reduce her risk of cancer recurrence,  The potential benefit and side effects, which includes but not limited to, hot flash, skin and vaginal dryness, metabolic changes ( increased blood glucose, cholesterol, weight, etc.), slightly in increased risk of cardiovascular disease, cataracts, muscular and joint discomfort, osteopenia and osteoporosis, etc, were discussed with her in great details. She is interested, and we'll start after she completes radiation. -I will screen her for the NATALEE study after she starts AI, we discussed today, she is interested  -I plan to go back when she finishes radiation. -will order a baseline DEXA    2. Substance abuse and smoking -She has been smoking cigarettes and marijuana for decades.   -she finally quit smoking on 02/10/2018  3.  HTN -previously on amlodipine, stopped due to lower extremity edema.   -Not very well controlled, she is not on BP meds now -I again advised her to follow-up with PCP  4. Bilateral LL edema, with red itchy rash on left leg -Most likely an allergic reaction due to un unknown allergen  -I advised her to continue applying topical steroids to the rash, it has been helping. I advised her to continue and use anti-histamine -f/u with PCP   5.  Peripheral neuropathy, to  chemotherapy -overall mild and stable, continue monitoring.  Plan  -f/u on 06/22/2017 with labs -plan to start anti-estrogen therapy next visit, and screen her for the NATALEE trail  -DEXA in the next month    No problem-specific Assessment & Plan notes found for this encounter.   No orders of the defined types were placed in this encounter.  All questions were answered. The patient knows to call the clinic with any problems, questions or concerns. No barriers to learning was detected. I spent 20 minutes counseling the patient face to face. The total time spent in the appointment was 25 minutes and more than 50% was on counseling and review of test results  I, Noor Dweik am acting as scribe for Dr. [name].  I have reviewed the above documentation for accuracy and completeness, and I agree with the above.  [name], MD 05/04/2018

## 2018-05-04 ENCOUNTER — Encounter: Payer: Self-pay | Admitting: Hematology

## 2018-05-04 ENCOUNTER — Inpatient Hospital Stay: Payer: Medicaid Other | Attending: Nurse Practitioner | Admitting: Hematology

## 2018-05-04 VITALS — BP 156/95 | HR 82 | Temp 98.0°F | Resp 18 | Ht 64.0 in | Wt 174.3 lb

## 2018-05-04 DIAGNOSIS — Z9221 Personal history of antineoplastic chemotherapy: Secondary | ICD-10-CM | POA: Insufficient documentation

## 2018-05-04 DIAGNOSIS — R21 Rash and other nonspecific skin eruption: Secondary | ICD-10-CM | POA: Diagnosis not present

## 2018-05-04 DIAGNOSIS — Z79899 Other long term (current) drug therapy: Secondary | ICD-10-CM | POA: Diagnosis not present

## 2018-05-04 DIAGNOSIS — C50412 Malignant neoplasm of upper-outer quadrant of left female breast: Secondary | ICD-10-CM

## 2018-05-04 DIAGNOSIS — E2839 Other primary ovarian failure: Secondary | ICD-10-CM

## 2018-05-04 DIAGNOSIS — Z9013 Acquired absence of bilateral breasts and nipples: Secondary | ICD-10-CM | POA: Insufficient documentation

## 2018-05-04 DIAGNOSIS — Z170 Estrogen receptor positive status [ER+]: Secondary | ICD-10-CM | POA: Insufficient documentation

## 2018-05-04 DIAGNOSIS — F17210 Nicotine dependence, cigarettes, uncomplicated: Secondary | ICD-10-CM | POA: Diagnosis not present

## 2018-05-04 DIAGNOSIS — I10 Essential (primary) hypertension: Secondary | ICD-10-CM | POA: Diagnosis not present

## 2018-05-04 DIAGNOSIS — C50811 Malignant neoplasm of overlapping sites of right female breast: Secondary | ICD-10-CM | POA: Diagnosis not present

## 2018-05-04 DIAGNOSIS — G620 Drug-induced polyneuropathy: Secondary | ICD-10-CM | POA: Diagnosis not present

## 2018-05-04 DIAGNOSIS — T451X5A Adverse effect of antineoplastic and immunosuppressive drugs, initial encounter: Secondary | ICD-10-CM | POA: Diagnosis not present

## 2018-05-05 ENCOUNTER — Telehealth: Payer: Self-pay

## 2018-05-05 NOTE — Telephone Encounter (Signed)
Spoke with patient concerning her upcoming appointment. Will mail a letter with a calender enclosed. Per 11/25 los

## 2018-05-12 ENCOUNTER — Ambulatory Visit
Admission: RE | Admit: 2018-05-12 | Discharge: 2018-05-12 | Disposition: A | Discharge status: Home / Self Care | Payer: Medicaid Other | Source: Ambulatory Visit | Attending: Radiation Oncology | Admitting: Radiation Oncology

## 2018-05-12 DIAGNOSIS — C50811 Malignant neoplasm of overlapping sites of right female breast: Secondary | ICD-10-CM | POA: Insufficient documentation

## 2018-05-12 DIAGNOSIS — Z510 Encounter for antineoplastic radiation therapy: Secondary | ICD-10-CM | POA: Insufficient documentation

## 2018-05-12 DIAGNOSIS — Z170 Estrogen receptor positive status [ER+]: Secondary | ICD-10-CM | POA: Insufficient documentation

## 2018-05-15 NOTE — Progress Notes (Signed)
  Radiation Oncology         [phone]) [phone] ________________________________  Name: Darlene Beasley MRN: [id number]  Date: 05/12/2018  DOB: 03/13/1956  DIAGNOSIS:     ICD-10-CM   1. Malignant neoplasm of overlapping sites of right breast in female, estrogen receptor positive ([place]) C50.811    Z17.0      SIMULATION AND TREATMENT PLANNING NOTE  The patient presented for simulation prior to beginning her course of radiation treatment for her diagnosis of right-sided breast cancer. The patient was placed in a supine position on a breast board. A customized vac-lock bag was also constructed and this complex treatment device will be used on a daily basis during her treatment. In this fashion, a CT scan was obtained through the chest area and an isocenter was placed near the chest wall at the upper aspect of the right chest.  The patient will be planned to receive a course of radiation initially to a dose of 50.4 gray. This will consist of a 4 field technique targeting the right chest wall as well as the supraclavicular region. Therefore 2 customized medial and lateral tangent fields have been created targeting the chest wall, and also 2 additional customized fields have been designed to treat the supraclavicular region both with a right supraclavicular field and a right posterior axillary boost field. A forward planning/reduced field technique will also be evaluated to determine if this significantly improves the dose homogeneity of the overall plan.   Therefore, additional customized blocks/fields may be necessary.  This initial treatment will be accomplished at 1.8 gray per fraction.  The patient also will be treated to the left breast with whole breast tangent fields.  For this purpose, an additional 2 customized fields have been designed to treat the breast target region.  A forward planning/reduced field technique will also be utilized as necessary for treatment of this separate target area as well.   The initial treatment to the left breast will be accomplished at 1.8 Gy/day to a total of 50.4 Gy.  The initial plan will consist of a 3-D conformal technique. The target volume/scar, heart and lungs have been contoured and dose volume histograms of each of these structures will be evaluated as part of the 3-D conformal treatment planning process.   It is anticipated that the patient will then receive a 10 gray boost to each separate target area in both the right and the left breast. This will be accomplished at 2 gray per fraction. The final anticipated total dose therefore will correspond to 60.4 gray.    _______________________________   Jodelle Gross, MD, PhD

## 2018-05-15 NOTE — Progress Notes (Signed)
  Radiation Oncology         [phone]) [phone] ________________________________  Name: Darlene Beasley MRN: [id number]  Date: 05/12/2018  DOB: 05-18-1956  Optical Surface Tracking Plan:  Since intensity modulated radiotherapy (IMRT) and 3D conformal radiation treatment methods are predicated on accurate and precise positioning for treatment, intrafraction motion monitoring is medically necessary to ensure accurate and safe treatment delivery.  The ability to quantify intrafraction motion without excessive ionizing radiation dose can only be performed with optical surface tracking. Accordingly, surface imaging offers the opportunity to obtain 3D measurements of patient position throughout IMRT and 3D treatments without excessive radiation exposure.  I am ordering optical surface tracking for this patient's upcoming course of radiotherapy. ________________________________  Kyung Rudd, MD 05/15/2018 8:18 AM    Reference:   Particia Jasper, et al. Surface imaging-based analysis of intrafraction motion for breast radiotherapy patients.Journal of [place], n. 6, nov. 2014. ISSN [id number].   Available at: <http://www.jacmp.org/index.php/jacmp/article/view/4957>.

## 2018-05-18 ENCOUNTER — Ambulatory Visit: Payer: Medicaid Other | Admitting: Physical Therapy

## 2018-05-18 DIAGNOSIS — Z510 Encounter for antineoplastic radiation therapy: Secondary | ICD-10-CM | POA: Diagnosis not present

## 2018-05-19 ENCOUNTER — Ambulatory Visit
Admission: RE | Admit: 2018-05-19 | Discharge: 2018-05-19 | Disposition: A | Discharge status: Home / Self Care | Payer: Medicaid Other | Source: Ambulatory Visit | Attending: Radiation Oncology | Admitting: Radiation Oncology

## 2018-05-19 DIAGNOSIS — Z510 Encounter for antineoplastic radiation therapy: Secondary | ICD-10-CM | POA: Diagnosis not present

## 2018-05-20 ENCOUNTER — Ambulatory Visit
Admission: RE | Admit: 2018-05-20 | Discharge: 2018-05-20 | Disposition: A | Discharge status: Home / Self Care | Payer: Medicaid Other | Source: Ambulatory Visit | Attending: Radiation Oncology | Admitting: Radiation Oncology

## 2018-05-20 DIAGNOSIS — Z510 Encounter for antineoplastic radiation therapy: Secondary | ICD-10-CM | POA: Diagnosis not present

## 2018-05-21 ENCOUNTER — Ambulatory Visit
Admission: RE | Admit: 2018-05-21 | Discharge: 2018-05-21 | Disposition: A | Discharge status: Home / Self Care | Payer: Medicaid Other | Source: Ambulatory Visit | Attending: Radiation Oncology | Admitting: Radiation Oncology

## 2018-05-21 ENCOUNTER — Ambulatory Visit: Payer: Medicaid Other | Attending: General Surgery | Admitting: Physical Therapy

## 2018-05-21 ENCOUNTER — Encounter: Payer: Self-pay | Admitting: Physical Therapy

## 2018-05-21 ENCOUNTER — Other Ambulatory Visit: Payer: Self-pay

## 2018-05-21 DIAGNOSIS — M25611 Stiffness of right shoulder, not elsewhere classified: Secondary | ICD-10-CM | POA: Diagnosis not present

## 2018-05-21 DIAGNOSIS — M25612 Stiffness of left shoulder, not elsewhere classified: Secondary | ICD-10-CM | POA: Insufficient documentation

## 2018-05-21 DIAGNOSIS — Z510 Encounter for antineoplastic radiation therapy: Secondary | ICD-10-CM | POA: Diagnosis not present

## 2018-05-21 DIAGNOSIS — R293 Abnormal posture: Secondary | ICD-10-CM | POA: Insufficient documentation

## 2018-05-21 NOTE — Therapy (Signed)
[place], [place], [address] Phone: [phone]   Fax:  [phone]  Physical Therapy Evaluation  Patient Details  Name: Darlene Beasley MRN: [id number] Date of Birth: [birth year] Referring Provider (PT): Dr. Dalbert Batman   Encounter Date: 05/21/2018  PT End of Session - 05/21/18 1153    Visit Number  1    Number of Visits  4    Date for PT Re-Evaluation  06/18/18    PT Start Time  1105    PT Stop Time  1145    PT Time Calculation (min)  40 min    Activity Tolerance  Patient tolerated treatment well    Behavior During Therapy  [facility] for tasks assessed/performed       Past Medical History:  Diagnosis Date  . Adopted   . Arthritis   . breast ca dx'd 08/2017  . Genetic testing 11/14/2017   Multi-Cancer panel (83 genes) @ [facility] - Pathogenic mutation in the MITF gene  . GERD (gastroesophageal reflux disease)   . 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 BREAST LUMPECTOMY WITH RADIOACTIVE SEED AND SENTINEL LYMPH NODE BIOPSY Left 04/03/2018   Procedure: LEFT BREAST LUMPECTOMY WITH RADIOACTIVE SEED AND LEFT SENTINEL LYMPH NODE BIOPSY;  Surgeon: [name], MD;  Location: [place];  Service: General;  Laterality: Left;  . IR IMAGING GUIDED PORT INSERTION  10/20/2017  . IR US GUIDE VASC ACCESS LEFT  10/20/2017  . MASTECTOMY MODIFIED RADICAL Right 04/03/2018   Procedure: MASTECTOMY MODIFIED RADICAL;  Surgeon: [name], MD;  Location: [place];  Service: General;  Laterality: Right;  . PORT-A-CATH REMOVAL N/A 04/03/2018   Procedure: REMOVAL PORT-A-CATH;  Surgeon: [name], MD;  Location: [place];  Service: General;  Laterality: N/A;  . TONSILLECTOMY Bilateral   . TUBAL LIGATION      There were no vitals filed for this  visit.   Subjective Assessment - 05/21/18 1112    Subjective  I was washing baseboards 3 weeks after surgery. I just helped my husband pull up carpet. I just returned to work. I had a R mastectomy with ALND and L lumpectomy with SLNB.     Pertinent History  bilateral breast cancer, R mastectomy with ALND and L lumpectomy with SLNB on 04/03/18, rheumatoid arthritis, pt has completed chemotherapy, pt just started radiation this week    Patient Stated Goals  pt states she does not have a goal, whatever we need    Currently in Pain?  No/denies    Pain Score  0-No pain         [facility] PT Assessment - 05/21/18 0001      Assessment   Medical Diagnosis  bilateral breast cancer    Referring Provider (PT)  Dr. Dalbert Batman    Onset Date/Surgical Date  04/03/18    Hand Dominance  Right    Prior Therapy  none      Precautions   Precautions  Other (comment)    Precaution Comments  lymphedema - at risk      Restrictions   Weight Bearing Restrictions  No      Balance Screen   Has the patient fallen in the past 6 months  No    Has the patient had a decrease in activity level because of a fear of falling?   No  Is the patient reluctant to leave their home because of a fear of falling?   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[employer], wash [employer]    Leisure  pt states she works 14 hrs a day 60 hrs a week normally and her job is very physically Systems analyst   Overall Cognitive Status  Within Functional Limits for tasks assessed      Observation/Other Assessments   Observations  1 tight band observed from chest to axilla on R, healing scar on right, some fibrosis under left lumpectomy scar       ROM / Strength   AROM / PROM / Strength  AROM      AROM   AROM Assessment Site  Shoulder    Right/Left Shoulder  Right;Left    Right Shoulder Flexion  158 Degrees    Right Shoulder ABduction  148 Degrees    Right Shoulder Internal Rotation  64 Degrees    Right Shoulder External Rotation  71 Degrees    Left Shoulder Flexion  150 Degrees    Left Shoulder ABduction  171 Degrees    Left Shoulder Internal Rotation  64 Degrees    Left Shoulder External Rotation  78 Degrees        LYMPHEDEMA/ONCOLOGY QUESTIONNAIRE - 05/21/18 1131      Type   Cancer Type  bilateral breast cancer      Surgeries   Mastectomy Date  04/03/18    Lumpectomy Date  04/03/18    Sentinel Lymph Node Biopsy Date  04/03/18    Axillary Lymph Node Dissection Date  04/03/18    Number Lymph Nodes Removed  --   pt not sure     Treatment   Active Chemotherapy Treatment  No    Past Chemotherapy Treatment  Yes    Active Radiation Treatment  Yes   pt has had 2 treatments   Past Radiation Treatment  No    Current Hormone Treatment  No   will start after radiation   Past Hormone Therapy  No      What other symptoms do you have   Are you Having Heaviness or Tightness  Yes    Are you having Pain  No    Are you having pitting edema  No    Is it Hard or Difficult finding clothes that fit  No    Do you have infections  No    Is there Decreased scar mobility  Yes             Objective measurements completed on examination: See above findings.                   PT Long Term Goals - 05/21/18 1203      PT LONG TERM GOAL #1   Title  Pt will demonstrate 165 degrees of bilateral shoulder flexion to allow pt to reach up in to cabinets    Baseline  R 158, L 148    Time  4    Period  Weeks  Status  New    Target Date  06/18/18      PT LONG TERM GOAL #2   Title  Pt to demonstrate 165 degrees of R shoulder abduction to allow her to reach out to sides    Baseline  150    Time  4    Period   Weeks    Status  New    Target Date  06/18/18      PT LONG TERM GOAL #3   Title  Pt to be independent in a home exercise program for continued strengthening and stretching    Time  4    Period  Weeks    Status  New    Target Date  06/18/18             Plan - 05/21/18 1154    Clinical Impression Statement  Pt presents to PT following a R mastectomy with ALND and L lumpectomy with SLNB for treatment of bilateral breast cancer. She has completed chemotherapy and is beginning radiation (she has had 2 treatments). She has some limited shoulder ROM in direction of flexion and abduction bilaterally. She has a tight band extending from right axilla to chest. She is returning to work full time the week of Christmas and her job is physically demanding. Pt would benefit from skilled PT services to increase bilateral shoulder ROM and instruct pt in a home exercise program for continued strengthening and stretching.    History and Personal Factors relevant to plan of care:  Pt is right handed    Clinical Presentation  Evolving    Clinical Presentation due to:  pt is undergoing radiation currently    Clinical Decision Making  Moderate    Rehab Potential  Good    Clinical Impairments Affecting Rehab Potential  pt undergoing radiation    PT Frequency  Other (comment)   3 times per authorization period   PT Treatment/Interventions  ADLs/Self Care Home Management;Therapeutic activities;Therapeutic exercise;Patient/family education;Manual techniques;Passive range of motion;Scar mobilization;Taping    PT Next Visit Plan  give ROM exercises, pulley, ball, gentle PROM to bilateral shoulders in direction of flexion and abduction    Consulted and Agree with Plan of Care  Patient       Patient will benefit from skilled therapeutic intervention in order to improve the following deficits and impairments:  Increased fascial restricitons, Decreased scar mobility, Postural dysfunction, Decreased range of  motion, Decreased strength, Decreased knowledge of precautions  Visit Diagnosis: Stiffness of right shoulder, not elsewhere classified  Stiffness of left shoulder, not elsewhere classified  Abnormal posture     Problem List Patient Active Problem List   Diagnosis Date Noted  . Genetic testing 11/14/2017  . 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 Adopted   . Port-A-Cath in place 10/22/2017  . Cancer of overlapping sites of right female breast ([place]) 10/08/2017  . Malignant neoplasm of left breast in female, estrogen receptor positive ([place]) 10/08/2017    [facility] 05/21/2018, 12:06 PM  [place], [place], [address] Phone: [phone]   Fax:  [phone]  Name: Darlene Beasley MRN: [id number] Date of Birth: March 14, 1956  Manus Gunning, PT 05/21/18 12:06 PM

## 2018-05-22 ENCOUNTER — Ambulatory Visit
Admission: RE | Admit: 2018-05-22 | Discharge: 2018-05-22 | Disposition: A | Discharge status: Home / Self Care | Payer: Medicaid Other | Source: Ambulatory Visit | Attending: Radiation Oncology | Admitting: Radiation Oncology

## 2018-05-22 ENCOUNTER — Other Ambulatory Visit: Payer: Self-pay | Admitting: Hematology

## 2018-05-22 ENCOUNTER — Telehealth: Payer: Self-pay

## 2018-05-22 DIAGNOSIS — Z510 Encounter for antineoplastic radiation therapy: Secondary | ICD-10-CM | POA: Diagnosis not present

## 2018-05-22 DIAGNOSIS — Z170 Estrogen receptor positive status [ER+]: Principal | ICD-10-CM

## 2018-05-22 DIAGNOSIS — C50412 Malignant neoplasm of upper-outer quadrant of left female breast: Secondary | ICD-10-CM

## 2018-05-22 MED ORDER — ALRA NON-METALLIC DEODORANT (RAD-ONC)
1.0000 "application " | Freq: Once | TOPICAL | Status: AC
  Administered 2018-05-22: 1 via TOPICAL

## 2018-05-22 MED ORDER — RADIAPLEXRX EX GEL
Freq: Once | CUTANEOUS | Status: AC
  Administered 2018-05-22: 18:00:00 via TOPICAL

## 2018-05-22 MED ORDER — HYDROCORTISONE 2.5 % EX CREA: TOPICAL_CREAM | Freq: Two times a day (BID) | CUTANEOUS | 0 refills | Status: DC

## 2018-05-22 NOTE — Progress Notes (Signed)
Pt here for patient teaching.  Pt given Radiation and You booklet, skin care instructions, Alra deodorant and Radiaplex gel.  Reviewed areas of pertinence such as fatigue, hair loss, skin changes, breast tenderness and breast swelling . Pt able to give teach back of to pat skin and use unscented/gentle soap,apply Radiaplex bid, avoid applying anything to skin within 4 hours of treatment, avoid wearing an under wire bra and to use an electric razor if they must shave. Pt verbalizes understanding of information given and will contact nursing with any questions or concerns.     Monte Bronder M. Sena Clouatre RN, BSN      

## 2018-05-22 NOTE — Progress Notes (Unsigned)
hy

## 2018-05-22 NOTE — Telephone Encounter (Signed)
Patient calls asking for a steroid creme to be sent into her pharmacy for the rash on her leg.  According to the note she has been using a topical hydrocortisone cream.

## 2018-05-22 NOTE — Telephone Encounter (Signed)
I spoke with patient, her rash is improved, but not completely resolved.  She has run out of hydrocortisone 2.5% cream, I called in for her.  [name] MD

## 2018-05-25 ENCOUNTER — Ambulatory Visit
Admission: RE | Admit: 2018-05-25 | Discharge: 2018-05-25 | Disposition: A | Discharge status: Home / Self Care | Payer: Medicaid Other | Source: Ambulatory Visit | Attending: Radiation Oncology | Admitting: Radiation Oncology

## 2018-05-25 DIAGNOSIS — Z510 Encounter for antineoplastic radiation therapy: Secondary | ICD-10-CM | POA: Diagnosis not present

## 2018-05-26 ENCOUNTER — Ambulatory Visit: Payer: Medicaid Other

## 2018-05-26 ENCOUNTER — Ambulatory Visit
Admission: RE | Admit: 2018-05-26 | Discharge: 2018-05-26 | Disposition: A | Discharge status: Home / Self Care | Payer: Medicaid Other | Source: Ambulatory Visit | Attending: Radiation Oncology | Admitting: Radiation Oncology

## 2018-05-26 DIAGNOSIS — Z510 Encounter for antineoplastic radiation therapy: Secondary | ICD-10-CM | POA: Diagnosis not present

## 2018-05-27 ENCOUNTER — Ambulatory Visit
Admission: RE | Admit: 2018-05-27 | Discharge: 2018-05-27 | Disposition: A | Discharge status: Home / Self Care | Payer: Medicaid Other | Source: Ambulatory Visit | Attending: Radiation Oncology | Admitting: Radiation Oncology

## 2018-05-27 DIAGNOSIS — Z510 Encounter for antineoplastic radiation therapy: Secondary | ICD-10-CM | POA: Diagnosis not present

## 2018-05-28 ENCOUNTER — Other Ambulatory Visit: Payer: Self-pay

## 2018-05-28 ENCOUNTER — Ambulatory Visit
Admission: RE | Admit: 2018-05-28 | Discharge: 2018-05-28 | Disposition: A | Discharge status: Home / Self Care | Payer: Medicaid Other | Source: Ambulatory Visit | Attending: Radiation Oncology | Admitting: Radiation Oncology

## 2018-05-28 ENCOUNTER — Encounter: Payer: Self-pay | Admitting: Physical Therapy

## 2018-05-28 ENCOUNTER — Ambulatory Visit: Payer: Medicaid Other | Admitting: Physical Therapy

## 2018-05-28 DIAGNOSIS — R293 Abnormal posture: Secondary | ICD-10-CM

## 2018-05-28 DIAGNOSIS — Z510 Encounter for antineoplastic radiation therapy: Secondary | ICD-10-CM | POA: Diagnosis not present

## 2018-05-28 DIAGNOSIS — M25612 Stiffness of left shoulder, not elsewhere classified: Secondary | ICD-10-CM

## 2018-05-28 DIAGNOSIS — M25611 Stiffness of right shoulder, not elsewhere classified: Secondary | ICD-10-CM | POA: Diagnosis not present

## 2018-05-28 NOTE — Therapy (Signed)
[place], [place], [address] Phone: [phone]   Fax:  [phone]  Physical Therapy Treatment  Patient Details  Name: Darlene Beasley MRN: [id number] Date of Birth: 18-Feb-1956 Referring Provider (PT): Dr. Dalbert Batman   Encounter Date: 05/28/2018  PT End of Session - 05/28/18 1551    Visit Number  2    Number of Visits  4    Date for PT Re-Evaluation  06/18/18    Authorization Type  05/26/18 to 06/08/18    Authorization - Visit Number  1    Authorization - Number of Visits  3    PT Start Time  1520    PT Stop Time  1559    PT Time Calculation (min)  39 min    Activity Tolerance  Patient tolerated treatment well    Behavior During Therapy  [facility] for tasks assessed/performed       Past Medical History:  Diagnosis Date  . Adopted   . Arthritis   . breast ca dx'd 08/2017  . Genetic testing 11/14/2017   Multi-Cancer panel (83 genes) @ [facility] - Pathogenic mutation in the MITF gene  . GERD (gastroesophageal reflux disease)   . 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 BREAST LUMPECTOMY WITH RADIOACTIVE SEED AND SENTINEL LYMPH NODE BIOPSY Left 04/03/2018   Procedure: LEFT BREAST LUMPECTOMY WITH RADIOACTIVE SEED AND LEFT SENTINEL LYMPH NODE BIOPSY;  Surgeon: [name], MD;  Location: [place];  Service: General;  Laterality: Left;  . IR IMAGING GUIDED PORT INSERTION  10/20/2017  . IR US GUIDE VASC ACCESS LEFT  10/20/2017  . MASTECTOMY MODIFIED RADICAL Right 04/03/2018   Procedure: MASTECTOMY MODIFIED RADICAL;  Surgeon: [name], MD;  Location: [place];  Service: General;  Laterality: Right;  . PORT-A-CATH REMOVAL N/A 04/03/2018   Procedure: REMOVAL PORT-A-CATH;  Surgeon: [name], MD;  Location: [place];  Service:  General;  Laterality: N/A;  . TONSILLECTOMY Bilateral   . TUBAL LIGATION      There were no vitals filed for this visit.  Subjective Assessment - 05/28/18 1522    Subjective  My shoulders are fine.    Pertinent History  bilateral breast cancer, R mastectomy with ALND and L lumpectomy with SLNB on 04/03/18, rheumatoid arthritis, pt has completed chemotherapy, pt just started radiation this week    Patient Stated Goals  pt states she does not have a goal, whatever we need    Currently in Pain?  No/denies    Pain Score  0-No pain         [facility] PT Assessment - 05/28/18 0001      AROM   Right Shoulder Flexion  158 Degrees    Right Shoulder ABduction  165 Degrees    Left Shoulder Flexion  163 Degrees    Left Shoulder ABduction  171 Degrees                   [facility] Adult PT Treatment/Exercise - 05/28/18 0001      Exercises   Exercises  Shoulder      Shoulder Exercises: Supine   Horizontal ABduction  Strengthening;Both;10 reps   pt returned therapist demo   Theraband Level (Shoulder Horizontal ABduction)  Level 2 (Red)    External Rotation  Strengthening;Both;10 reps   pt returned therapist demo  Theraband Level (Shoulder External Rotation)  Level 2 (Red)    Flexion  Strengthening;Both;10 reps   with narrow and wide grip, pt returned therapist demo   Theraband Level (Shoulder Flexion)  Level 2 (Red)    Diagonals  Strengthening;Both;10 reps   pt returned therapist demo   Theraband Level (Shoulder Diagonals)  Level 2 (Red)      Shoulder Exercises: Pulleys   Flexion  2 minutes   with cues not to lean   ABduction  2 minutes      Shoulder Exercises: Therapy Ball   Flexion  10 reps   with stretch at top   ABduction  10 reps;Both   with stretch at top   ABduction Limitations  `                  PT Long Term Goals - 05/21/18 1203      PT LONG TERM GOAL #1   Title  Pt will demonstrate 165 degrees of bilateral shoulder flexion to allow pt to reach up  in to cabinets    Baseline  R 158, L 148    Time  4    Period  Weeks    Status  New    Target Date  06/18/18      PT LONG TERM GOAL #2   Title  Pt to demonstrate 165 degrees of R shoulder abduction to allow her to reach out to sides    Baseline  150    Time  4    Period  Weeks    Status  New    Target Date  06/18/18      PT LONG TERM GOAL #3   Title  Pt to be independent in a home exercise program for continued strengthening and stretching    Time  4    Period  Weeks    Status  New    Target Date  06/18/18            Plan - 05/28/18 1552    Clinical Impression Statement  Pt demonstrates great improvement with bilateral shoulder ROM today and reports she has been exercising at home. Instructed pt in AAROM exercises and then instructed pt in supine scapular series to add to her home exercise program. Pt should be ready for discharge as soon as she is independent in her HEP.     Rehab Potential  Good    Clinical Impairments Affecting Rehab Potential  pt undergoing radiation    PT Frequency  Other (comment)   3 times per authorization   PT Treatment/Interventions  ADLs/Self Care Home Management;Therapeutic activities;Therapeutic exercise;Patient/family education;Manual techniques;Passive range of motion;Scar mobilization;Taping    PT Next Visit Plan  assess indep with supine scap,  pulley, ball, gentle PROM to bilateral shoulders in direction of flexion and abduction    Consulted and Agree with Plan of Care  Patient       Patient will benefit from skilled therapeutic intervention in order to improve the following deficits and impairments:  Increased fascial restricitons, Decreased scar mobility, Postural dysfunction, Decreased range of motion, Decreased strength, Decreased knowledge of precautions  Visit Diagnosis: Stiffness of right shoulder, not elsewhere classified  Stiffness of left shoulder, not elsewhere classified  Abnormal posture     Problem List Patient  Active Problem List   Diagnosis Date Noted  . Genetic testing 11/14/2017  . 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 Adopted   . Port-A-Cath in place 10/22/2017  . Cancer  of overlapping sites of right female breast ([place]) 10/08/2017  . Malignant neoplasm of left breast in female, estrogen receptor positive ([place]) 10/08/2017    Allyson Sabal [facility] 05/28/2018, 5:00 PM  [place], [place], [address] Phone: [phone]   Fax:  [phone]  Name: Charish Schroepfer MRN: [id number] Date of Birth: July 09, 1955  Manus Gunning, PT 05/28/18 5:00 PM

## 2018-05-28 NOTE — Patient Instructions (Signed)
Over Head Pull: Narrow and Wide Grip   Cancer Rehab [id number]   On back, knees bent, feet flat, band across thighs, elbows straight but relaxed. Pull hands apart (start). Keeping elbows straight, bring arms up and over head, hands toward floor. Keep pull steady on band. Hold momentarily. Return slowly, keeping pull steady, back to start. Then do same with a wider grip on the band (past shoulder width) Repeat _10__ times. Band color __red____   Side Pull: Double Arm   On back, knees bent, feet flat. Arms perpendicular to body, shoulder level, elbows straight but relaxed. Pull arms out to sides, elbows straight. Resistance band comes across collarbones, hands toward floor. Hold momentarily. Slowly return to starting position. Repeat _10__ times. Band color _red____   Sword   On back, knees bent, feet flat, left hand on left hip, right hand above left. Pull right arm DIAGONALLY (hip to shoulder) across chest. Bring right arm along head toward floor. Hold momentarily. Slowly return to starting position. Repeat on opposite side.  Repeat _10__ times. Do with left arm. Band color _red_____   Shoulder Rotation: Double Arm   On back, knees bent, feet flat, elbows tucked at sides, bent 90, hands palms up. Pull hands apart and down toward floor, keeping elbows near sides. Hold momentarily. Slowly return to starting position. Repeat _10__ times. Band color __red.

## 2018-05-29 ENCOUNTER — Ambulatory Visit
Admission: RE | Admit: 2018-05-29 | Discharge: 2018-05-29 | Disposition: A | Discharge status: Home / Self Care | Payer: Medicaid Other | Source: Ambulatory Visit | Attending: Radiation Oncology | Admitting: Radiation Oncology

## 2018-05-29 DIAGNOSIS — Z510 Encounter for antineoplastic radiation therapy: Secondary | ICD-10-CM | POA: Diagnosis not present

## 2018-06-01 ENCOUNTER — Encounter: Payer: Medicaid Other | Admitting: Rehabilitation

## 2018-06-01 ENCOUNTER — Ambulatory Visit
Admission: RE | Admit: 2018-06-01 | Discharge: 2018-06-01 | Disposition: A | Discharge status: Home / Self Care | Payer: Medicaid Other | Source: Ambulatory Visit | Attending: Radiation Oncology | Admitting: Radiation Oncology

## 2018-06-01 DIAGNOSIS — Z510 Encounter for antineoplastic radiation therapy: Secondary | ICD-10-CM | POA: Diagnosis not present

## 2018-06-02 ENCOUNTER — Ambulatory Visit
Admission: RE | Admit: 2018-06-02 | Discharge: 2018-06-02 | Disposition: A | Discharge status: Home / Self Care | Payer: Medicaid Other | Source: Ambulatory Visit | Attending: Radiation Oncology | Admitting: Radiation Oncology

## 2018-06-02 DIAGNOSIS — Z510 Encounter for antineoplastic radiation therapy: Secondary | ICD-10-CM | POA: Diagnosis not present

## 2018-06-04 ENCOUNTER — Ambulatory Visit: Payer: Medicaid Other | Admitting: Rehabilitation

## 2018-06-04 ENCOUNTER — Encounter: Payer: Self-pay | Admitting: Rehabilitation

## 2018-06-04 ENCOUNTER — Ambulatory Visit
Admission: RE | Admit: 2018-06-04 | Discharge: 2018-06-04 | Disposition: A | Discharge status: Home / Self Care | Payer: Medicaid Other | Source: Ambulatory Visit | Attending: Radiation Oncology | Admitting: Radiation Oncology

## 2018-06-04 DIAGNOSIS — R293 Abnormal posture: Secondary | ICD-10-CM

## 2018-06-04 DIAGNOSIS — M25611 Stiffness of right shoulder, not elsewhere classified: Secondary | ICD-10-CM | POA: Diagnosis not present

## 2018-06-04 DIAGNOSIS — Z510 Encounter for antineoplastic radiation therapy: Secondary | ICD-10-CM | POA: Diagnosis not present

## 2018-06-04 DIAGNOSIS — M25612 Stiffness of left shoulder, not elsewhere classified: Secondary | ICD-10-CM

## 2018-06-04 NOTE — Therapy (Addendum)
[place], [place], [address] Phone: [phone]   Fax:  [phone]  Physical Therapy Treatment  Patient Details  Name: Darlene Beasley MRN: [id number] Date of Birth: August 29, 1955 Referring Provider (PT): Dr. Dalbert Batman   Encounter Date: 06/04/2018  PT End of Session - 06/04/18 0806    Visit Number  3    Number of Visits  4    Date for PT Re-Evaluation  06/18/18    Authorization Type  05/26/18 to 06/08/18    PT Start Time  0808   pt arriving late   PT Stop Time  0845    PT Time Calculation (min)  37 min    Activity Tolerance  Patient tolerated treatment well    Behavior During Therapy  [facility] for tasks assessed/performed       Past Medical History:  Diagnosis Date  . Adopted   . Arthritis   . breast ca dx'd 08/2017  . Genetic testing 11/14/2017   Multi-Cancer panel (83 genes) @ [facility] - Pathogenic mutation in the MITF gene  . GERD (gastroesophageal reflux disease)   . 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 BREAST LUMPECTOMY WITH RADIOACTIVE SEED AND SENTINEL LYMPH NODE BIOPSY Left 04/03/2018   Procedure: LEFT BREAST LUMPECTOMY WITH RADIOACTIVE SEED AND LEFT SENTINEL LYMPH NODE BIOPSY;  Surgeon: [name], MD;  Location: [place];  Service: General;  Laterality: Left;  . IR IMAGING GUIDED PORT INSERTION  10/20/2017  . IR US GUIDE VASC ACCESS LEFT  10/20/2017  . MASTECTOMY MODIFIED RADICAL Right 04/03/2018   Procedure: MASTECTOMY MODIFIED RADICAL;  Surgeon: [name], MD;  Location: [place];  Service: General;  Laterality: Right;  . PORT-A-CATH REMOVAL N/A 04/03/2018   Procedure: REMOVAL PORT-A-CATH;  Surgeon: [name], MD;  Location: [place];  Service: General;  Laterality: N/A;  . TONSILLECTOMY Bilateral   .  TUBAL LIGATION      There were no vitals filed for this visit.  Subjective Assessment - 06/04/18 0806    Subjective  outside of worrying about appointments I am doing fine.  I go back to full time schedule on monday and don't think I can do this anymore.  No issues at work     Pertinent History  bilateral breast cancer, R mastectomy with ALND and L lumpectomy with SLNB on 04/03/18, rheumatoid arthritis, pt has completed chemotherapy, pt just started radiation this week    Patient Stated Goals  pt states she does not have a goal, whatever we need    Currently in Pain?  No/denies         [facility] PT Assessment - 06/04/18 0001      ROM / Strength   AROM / PROM / Strength  Strength      AROM   Right Shoulder Flexion  170 Degrees    Right Shoulder ABduction  170 Degrees    Left Shoulder Flexion  170 Degrees    Left Shoulder ABduction  171 Degrees      Strength   Overall Strength Comments  5/5 overall bil shoulders                   [facility] Adult PT Treatment/Exercise - 06/04/18 0001      Shoulder Exercises: Supine   Horizontal ABduction  Strengthening;Both;10 reps    Theraband Level (  Shoulder Horizontal ABduction)  Level 3 (Green)    External Rotation  Strengthening;Both;10 reps    Theraband Level (Shoulder External Rotation)  Level 3 (Green)    Flexion  Both;10 reps    Theraband Level (Shoulder Flexion)  Level 3 (Green)    Diagonals  Both;10 reps    Theraband Level (Shoulder Diagonals)  Level 3 (Green)    Other Supine Exercises  supine cane flexion x 10 for shoulder warm up                  PT Long Term Goals - 06/04/18 3614      PT LONG TERM GOAL #1   Title  Pt will demonstrate 165 degrees of bilateral shoulder flexion to allow pt to reach up in to cabinets    Status  Achieved      PT LONG TERM GOAL #2   Title  Pt to demonstrate 165 degrees of R shoulder abduction to allow her to reach out to sides    Status  Achieved      PT LONG TERM GOAL #3    Title  Pt to be independent in a home exercise program for continued strengthening and stretching    Status  Achieved            Plan - 06/04/18 0817    Clinical Impression Statement  Pt returns with even more improvements in ROM today.  Her concerns about returning to work and maintaining a radiation schedule and therapy schedule are making her very anxious.  Pt is doing very well with no pain, no sig ROM limtations and good understanding so we will not ask for any more visits at this time.  She will return if radiation decreases her ROM any further or with any concerns.  Given green band for home use    Clinical Impairments Affecting Rehab Potential  pt undergoing radiation    PT Frequency  Other (comment)    PT Treatment/Interventions  ADLs/Self Care Home Management;Therapeutic activities;Therapeutic exercise;Patient/family education;Manual techniques;Passive range of motion;Scar mobilization;Taping    PT Next Visit Plan  will attempt DC today due to pt request     Consulted and Agree with Plan of Care  Patient       Patient will benefit from skilled therapeutic intervention in order to improve the following deficits and impairments:  Increased fascial restricitons, Decreased scar mobility, Postural dysfunction, Decreased range of motion, Decreased strength, Decreased knowledge of precautions  Visit Diagnosis: Stiffness of right shoulder, not elsewhere classified  Stiffness of left shoulder, not elsewhere classified  Abnormal posture     Problem List Patient Active Problem List   Diagnosis Date Noted  . Genetic testing 11/14/2017  . 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 Adopted   . Port-A-Cath in place 10/22/2017  . Cancer of overlapping sites of right female breast ([place]) 10/08/2017  . Malignant neoplasm of left breast in female, estrogen receptor positive ([place]) 10/08/2017    Shan Levans, PT 06/04/2018, 8:42 AM  [place], [place], [address] Phone: [phone]   Fax:  [phone]  Name: Darlene Beasley MRN: [id number] Date of Birth: 01-Mar-1956 PHYSICAL THERAPY DISCHARGE SUMMARY  Visits from Start of Care: 3  Current functional level related to goals / functional outcomes: Pt feels ready for return to work    Remaining deficits: none   Education / Equipment: HEP Plan: Patient agrees to discharge.  Patient goals were met. Patient  is being discharged due to meeting the stated rehab goals.  ?????    Shan Levans, PT

## 2018-06-04 NOTE — Patient Instructions (Signed)
Pt will continue with HEP for now and return if radiation decreases ROM

## 2018-06-05 ENCOUNTER — Encounter

## 2018-06-05 ENCOUNTER — Ambulatory Visit
Admission: RE | Admit: 2018-06-05 | Discharge: 2018-06-05 | Disposition: A | Discharge status: Home / Self Care | Payer: Medicaid Other | Source: Ambulatory Visit | Attending: Radiation Oncology | Admitting: Radiation Oncology

## 2018-06-05 DIAGNOSIS — Z510 Encounter for antineoplastic radiation therapy: Secondary | ICD-10-CM | POA: Diagnosis not present

## 2018-06-08 ENCOUNTER — Ambulatory Visit: Payer: Medicaid Other

## 2018-06-08 ENCOUNTER — Ambulatory Visit
Admission: RE | Admit: 2018-06-08 | Discharge: 2018-06-08 | Disposition: A | Discharge status: Home / Self Care | Payer: Medicaid Other | Source: Ambulatory Visit | Attending: Radiation Oncology | Admitting: Radiation Oncology

## 2018-06-08 DIAGNOSIS — Z510 Encounter for antineoplastic radiation therapy: Secondary | ICD-10-CM | POA: Diagnosis not present

## 2018-06-09 ENCOUNTER — Ambulatory Visit
Admission: RE | Admit: 2018-06-09 | Discharge: 2018-06-09 | Disposition: A | Discharge status: Home / Self Care | Payer: Medicaid Other | Source: Ambulatory Visit | Attending: Radiation Oncology | Admitting: Radiation Oncology

## 2018-06-09 ENCOUNTER — Encounter

## 2018-06-09 DIAGNOSIS — Z510 Encounter for antineoplastic radiation therapy: Secondary | ICD-10-CM | POA: Diagnosis not present

## 2018-06-11 ENCOUNTER — Ambulatory Visit: Payer: Medicaid Other

## 2018-06-12 ENCOUNTER — Ambulatory Visit
Admission: RE | Admit: 2018-06-12 | Discharge: 2018-06-12 | Disposition: A | Discharge status: Home / Self Care | Payer: Medicaid Other | Source: Ambulatory Visit | Attending: Radiation Oncology | Admitting: Radiation Oncology

## 2018-06-12 DIAGNOSIS — Z510 Encounter for antineoplastic radiation therapy: Secondary | ICD-10-CM | POA: Diagnosis present

## 2018-06-12 DIAGNOSIS — C50811 Malignant neoplasm of overlapping sites of right female breast: Secondary | ICD-10-CM | POA: Diagnosis not present

## 2018-06-12 DIAGNOSIS — Z170 Estrogen receptor positive status [ER+]: Secondary | ICD-10-CM | POA: Insufficient documentation

## 2018-06-15 ENCOUNTER — Ambulatory Visit
Admission: RE | Admit: 2018-06-15 | Discharge: 2018-06-15 | Disposition: A | Discharge status: Home / Self Care | Payer: Medicaid Other | Source: Ambulatory Visit | Attending: Radiation Oncology | Admitting: Radiation Oncology

## 2018-06-15 DIAGNOSIS — Z510 Encounter for antineoplastic radiation therapy: Secondary | ICD-10-CM | POA: Diagnosis not present

## 2018-06-16 ENCOUNTER — Ambulatory Visit
Admission: RE | Admit: 2018-06-16 | Discharge: 2018-06-16 | Disposition: A | Discharge status: Home / Self Care | Payer: Medicaid Other | Source: Ambulatory Visit | Attending: Radiation Oncology | Admitting: Radiation Oncology

## 2018-06-16 DIAGNOSIS — Z510 Encounter for antineoplastic radiation therapy: Secondary | ICD-10-CM | POA: Diagnosis not present

## 2018-06-17 ENCOUNTER — Ambulatory Visit
Admission: RE | Admit: 2018-06-17 | Discharge: 2018-06-17 | Disposition: A | Discharge status: Home / Self Care | Payer: Medicaid Other | Source: Ambulatory Visit | Attending: Radiation Oncology | Admitting: Radiation Oncology

## 2018-06-17 DIAGNOSIS — Z510 Encounter for antineoplastic radiation therapy: Secondary | ICD-10-CM | POA: Diagnosis not present

## 2018-06-18 ENCOUNTER — Ambulatory Visit
Admission: RE | Admit: 2018-06-18 | Discharge: 2018-06-18 | Disposition: A | Discharge status: Home / Self Care | Payer: Medicaid Other | Source: Ambulatory Visit | Attending: Radiation Oncology | Admitting: Radiation Oncology

## 2018-06-18 DIAGNOSIS — Z510 Encounter for antineoplastic radiation therapy: Secondary | ICD-10-CM | POA: Diagnosis not present

## 2018-06-19 ENCOUNTER — Ambulatory Visit
Admission: RE | Admit: 2018-06-19 | Discharge: 2018-06-19 | Disposition: A | Discharge status: Home / Self Care | Payer: Medicaid Other | Source: Ambulatory Visit | Attending: Radiation Oncology | Admitting: Radiation Oncology

## 2018-06-19 DIAGNOSIS — Z510 Encounter for antineoplastic radiation therapy: Secondary | ICD-10-CM | POA: Diagnosis not present

## 2018-06-22 ENCOUNTER — Telehealth: Payer: Self-pay | Admitting: Hematology

## 2018-06-22 ENCOUNTER — Ambulatory Visit
Admission: RE | Admit: 2018-06-22 | Discharge: 2018-06-22 | Disposition: A | Discharge status: Home / Self Care | Payer: Medicaid Other | Source: Ambulatory Visit | Attending: Radiation Oncology | Admitting: Radiation Oncology

## 2018-06-22 DIAGNOSIS — Z510 Encounter for antineoplastic radiation therapy: Secondary | ICD-10-CM | POA: Diagnosis not present

## 2018-06-22 NOTE — Telephone Encounter (Signed)
R/s appt per 1/12 sch message - left message with partner Laurence Aly  About appt change -

## 2018-06-23 ENCOUNTER — Inpatient Hospital Stay: Payer: Medicaid Other

## 2018-06-23 ENCOUNTER — Inpatient Hospital Stay: Payer: Medicaid Other | Admitting: Hematology

## 2018-06-23 ENCOUNTER — Ambulatory Visit
Admission: RE | Admit: 2018-06-23 | Discharge: 2018-06-23 | Disposition: A | Discharge status: Home / Self Care | Payer: Medicaid Other | Source: Ambulatory Visit | Attending: Radiation Oncology | Admitting: Radiation Oncology

## 2018-06-23 DIAGNOSIS — Z510 Encounter for antineoplastic radiation therapy: Secondary | ICD-10-CM | POA: Diagnosis not present

## 2018-06-24 ENCOUNTER — Ambulatory Visit
Admission: RE | Admit: 2018-06-24 | Discharge: 2018-06-24 | Disposition: A | Discharge status: Home / Self Care | Payer: Medicaid Other | Source: Ambulatory Visit | Attending: Radiation Oncology | Admitting: Radiation Oncology

## 2018-06-24 DIAGNOSIS — Z510 Encounter for antineoplastic radiation therapy: Secondary | ICD-10-CM | POA: Diagnosis not present

## 2018-06-25 ENCOUNTER — Ambulatory Visit
Admission: RE | Admit: 2018-06-25 | Discharge: 2018-06-25 | Disposition: A | Discharge status: Home / Self Care | Payer: Medicaid Other | Source: Ambulatory Visit | Attending: Radiation Oncology | Admitting: Radiation Oncology

## 2018-06-25 DIAGNOSIS — Z510 Encounter for antineoplastic radiation therapy: Secondary | ICD-10-CM | POA: Diagnosis not present

## 2018-06-26 ENCOUNTER — Ambulatory Visit
Admission: RE | Admit: 2018-06-26 | Discharge: 2018-06-26 | Disposition: A | Discharge status: Home / Self Care | Payer: Medicaid Other | Source: Ambulatory Visit | Attending: Radiation Oncology | Admitting: Radiation Oncology

## 2018-06-26 DIAGNOSIS — Z510 Encounter for antineoplastic radiation therapy: Secondary | ICD-10-CM | POA: Diagnosis not present

## 2018-06-29 ENCOUNTER — Ambulatory Visit
Admission: RE | Admit: 2018-06-29 | Discharge: 2018-06-29 | Disposition: A | Discharge status: Home / Self Care | Payer: Medicaid Other | Source: Ambulatory Visit | Attending: Radiation Oncology | Admitting: Radiation Oncology

## 2018-06-29 DIAGNOSIS — Z510 Encounter for antineoplastic radiation therapy: Secondary | ICD-10-CM | POA: Diagnosis not present

## 2018-06-30 ENCOUNTER — Ambulatory Visit
Admission: RE | Admit: 2018-06-30 | Discharge: 2018-06-30 | Disposition: A | Discharge status: Home / Self Care | Payer: Medicaid Other | Source: Ambulatory Visit | Attending: Radiation Oncology | Admitting: Radiation Oncology

## 2018-06-30 DIAGNOSIS — Z510 Encounter for antineoplastic radiation therapy: Secondary | ICD-10-CM | POA: Diagnosis not present

## 2018-07-01 ENCOUNTER — Ambulatory Visit: Payer: Medicaid Other

## 2018-07-01 ENCOUNTER — Ambulatory Visit
Admission: RE | Admit: 2018-07-01 | Discharge: 2018-07-01 | Disposition: A | Discharge status: Home / Self Care | Payer: Medicaid Other | Source: Ambulatory Visit | Attending: Radiation Oncology | Admitting: Radiation Oncology

## 2018-07-01 DIAGNOSIS — Z510 Encounter for antineoplastic radiation therapy: Secondary | ICD-10-CM | POA: Diagnosis not present

## 2018-07-02 ENCOUNTER — Ambulatory Visit: Payer: Medicaid Other

## 2018-07-02 ENCOUNTER — Ambulatory Visit
Admission: RE | Admit: 2018-07-02 | Discharge: 2018-07-02 | Disposition: A | Discharge status: Home / Self Care | Payer: Medicaid Other | Source: Ambulatory Visit | Attending: Radiation Oncology | Admitting: Radiation Oncology

## 2018-07-02 DIAGNOSIS — Z510 Encounter for antineoplastic radiation therapy: Secondary | ICD-10-CM | POA: Diagnosis not present

## 2018-07-03 ENCOUNTER — Ambulatory Visit
Admission: RE | Admit: 2018-07-03 | Discharge: 2018-07-03 | Disposition: A | Discharge status: Home / Self Care | Payer: Medicaid Other | Source: Ambulatory Visit | Attending: Radiation Oncology | Admitting: Radiation Oncology

## 2018-07-03 DIAGNOSIS — Z510 Encounter for antineoplastic radiation therapy: Secondary | ICD-10-CM | POA: Diagnosis not present

## 2018-07-06 ENCOUNTER — Ambulatory Visit
Admission: RE | Admit: 2018-07-06 | Discharge: 2018-07-06 | Disposition: A | Discharge status: Home / Self Care | Payer: Medicaid Other | Source: Ambulatory Visit | Attending: Radiation Oncology | Admitting: Radiation Oncology

## 2018-07-06 DIAGNOSIS — Z510 Encounter for antineoplastic radiation therapy: Secondary | ICD-10-CM | POA: Diagnosis not present

## 2018-07-07 ENCOUNTER — Encounter: Payer: Self-pay | Admitting: Hematology

## 2018-07-07 ENCOUNTER — Inpatient Hospital Stay (HOSPITAL_BASED_OUTPATIENT_CLINIC_OR_DEPARTMENT_OTHER): Payer: Medicaid Other | Admitting: Hematology

## 2018-07-07 ENCOUNTER — Ambulatory Visit: Payer: Medicaid Other

## 2018-07-07 ENCOUNTER — Inpatient Hospital Stay: Payer: Medicaid Other | Attending: Nurse Practitioner

## 2018-07-07 ENCOUNTER — Ambulatory Visit
Admission: RE | Admit: 2018-07-07 | Discharge: 2018-07-07 | Disposition: A | Discharge status: Home / Self Care | Payer: Medicaid Other | Source: Ambulatory Visit | Attending: Radiation Oncology | Admitting: Radiation Oncology

## 2018-07-07 VITALS — BP 154/87 | HR 79 | Temp 98.4°F | Resp 18 | Ht 64.0 in | Wt 173.9 lb

## 2018-07-07 DIAGNOSIS — Z79899 Other long term (current) drug therapy: Secondary | ICD-10-CM

## 2018-07-07 DIAGNOSIS — F17210 Nicotine dependence, cigarettes, uncomplicated: Secondary | ICD-10-CM | POA: Insufficient documentation

## 2018-07-07 DIAGNOSIS — Z170 Estrogen receptor positive status [ER+]: Secondary | ICD-10-CM | POA: Insufficient documentation

## 2018-07-07 DIAGNOSIS — G620 Drug-induced polyneuropathy: Secondary | ICD-10-CM | POA: Diagnosis not present

## 2018-07-07 DIAGNOSIS — R21 Rash and other nonspecific skin eruption: Secondary | ICD-10-CM | POA: Diagnosis not present

## 2018-07-07 DIAGNOSIS — C50412 Malignant neoplasm of upper-outer quadrant of left female breast: Secondary | ICD-10-CM

## 2018-07-07 DIAGNOSIS — I10 Essential (primary) hypertension: Secondary | ICD-10-CM | POA: Diagnosis not present

## 2018-07-07 DIAGNOSIS — Z9221 Personal history of antineoplastic chemotherapy: Secondary | ICD-10-CM | POA: Diagnosis not present

## 2018-07-07 DIAGNOSIS — C50811 Malignant neoplasm of overlapping sites of right female breast: Secondary | ICD-10-CM

## 2018-07-07 DIAGNOSIS — Z510 Encounter for antineoplastic radiation therapy: Secondary | ICD-10-CM | POA: Diagnosis not present

## 2018-07-07 DIAGNOSIS — T451X5S Adverse effect of antineoplastic and immunosuppressive drugs, sequela: Secondary | ICD-10-CM | POA: Insufficient documentation

## 2018-07-07 DIAGNOSIS — Z9013 Acquired absence of bilateral breasts and nipples: Secondary | ICD-10-CM

## 2018-07-07 LAB — CBC WITH DIFFERENTIAL (CANCER CENTER ONLY)
ABS IMMATURE GRANULOCYTES: 0.02 10*3/uL (ref 0.00–0.07)
BASOS PCT: 0 %
Basophils Absolute: 0 10*3/uL (ref 0.0–0.1)
EOS ABS: 0.1 10*3/uL (ref 0.0–0.5)
Eosinophils Relative: 2 %
HEMATOCRIT: 41.7 % (ref 36.0–46.0)
Hemoglobin: 14 g/dL (ref 12.0–15.0)
IMMATURE GRANULOCYTES: 0 %
LYMPHS ABS: 0.9 10*3/uL (ref 0.7–4.0)
Lymphocytes Relative: 16 %
MCH: 31 pg (ref 26.0–34.0)
MCHC: 33.6 g/dL (ref 30.0–36.0)
MCV: 92.5 fL (ref 80.0–100.0)
MONO ABS: 0.5 10*3/uL (ref 0.1–1.0)
MONOS PCT: 8 %
NEUTROS PCT: 74 %
Neutro Abs: 4.2 10*3/uL (ref 1.7–7.7)
Platelet Count: 206 10*3/uL (ref 150–400)
RBC: 4.51 MIL/uL (ref 3.87–5.11)
RDW: 13.5 % (ref 11.5–15.5)
WBC Count: 5.8 10*3/uL (ref 4.0–10.5)
nRBC: 0 % (ref 0.0–0.2)

## 2018-07-07 LAB — CMP (CANCER CENTER ONLY)
ALBUMIN: 3.9 g/dL (ref 3.5–5.0)
ALK PHOS: 63 U/L (ref 38–126)
ALT: 14 U/L (ref 0–44)
ANION GAP: 7 (ref 5–15)
AST: 15 U/L (ref 15–41)
BILIRUBIN TOTAL: 0.4 mg/dL (ref 0.3–1.2)
BUN: 18 mg/dL (ref 8–23)
CALCIUM: 9.5 mg/dL (ref 8.9–10.3)
CO2: 30 mmol/L (ref 22–32)
CREATININE: 0.84 mg/dL (ref 0.44–1.00)
Chloride: 104 mmol/L (ref 98–111)
GFR, Est AFR Am: >60 mL/min (ref >60)
GFR, Estimated: >60 mL/min (ref >60)
GLUCOSE: 96 mg/dL (ref 70–99)
Potassium: 4.7 mmol/L (ref 3.5–5.1)
SODIUM: 141 mmol/L (ref 135–145)
TOTAL PROTEIN: 6.6 g/dL (ref 6.5–8.1)

## 2018-07-07 MED ORDER — LETROZOLE 2.5 MG PO TABS: 2.5000 mg | ORAL_TABLET | Freq: Every day | ORAL | 3 refills | Status: DC

## 2018-07-07 NOTE — Progress Notes (Signed)
[place]   [phone] [phone]   Clinic Follow up Note   Patient Care Team: [name], MD as PCP - General (Family Medicine) [name], MD as Consulting Physician (General Surgery) [name], MD as Consulting Physician (Hematology) 07/07/2018  CHIEF COMPLAINT: F/u on left breast cancer  SUMMARY OF ONCOLOGIC HISTORY: Oncology History   Cancer Staging Cancer of overlapping sites of right female breast [facility]) Staging form: Breast, AJCC 8th Edition - Clinical stage from 09/23/2017: Stage IIIA (cT3, cN1, cM0, G2, ER+, PR-, HER2-) - Signed by [name], MD on 10/09/2017 - Pathologic stage from 04/03/2018: No Stage Recommended (ypT3, pN2a, cM0, G3, ER+, PR-, HER2-) - Signed by [name], MD on 05/03/2018  Malignant neoplasm of left breast in female, estrogen receptor positive ([place]) Staging form: Breast, AJCC 8th Edition - Clinical: Stage IA (cT1b, cN0, cM0, G1, ER+, PR+, HER2-) - Unsigned - Pathologic stage from 04/03/2018: No Stage Recommended (ypT1b, pN0, cM0, G1, ER+, PR+, HER2-) - Signed by [name], MD on 05/03/2018       Cancer of overlapping sites of right female breast ([place])   09/09/2017 Mammogram    IMPRESSION: 1. Highly suspicious large right breast mass involving the outer and upper breast extending from approximately 9 o'clock through 12-1 o'clock, maximum measurement approximating 7.5 cm. The mass involves the right nipple, accounting for nipple retraction. Architectural distortion, microcalcifications and diffuse trabecular thickening and skin thickening are associated with the large mass, raising the possibility of this representing an inflammatory cancer. 2. Satellite masses separate from the dominant mass involving the lower outer quadrant and the upper inner quadrant of the right breast, measured above. 3. Two adjacent pathologic right axillary lymph nodes. 4. Indeterminate 0.9 cm solid mass involving the upper outer  quadrant of the left breast which accounts for a mammographic finding. 5. No pathologic left axillary lymphadenopathy.    09/09/2017 Breast US    Targeted right breast ultrasound is performed, showing a very large hypoechoic mass with irregular margins extending from the approximate 9 o'clock position through the 12 to 1 o'clock position. On the CC gait image, the mass measures maximally approximately 7.5 cm. The mass has irregular margins, demonstrates acoustic shadowing, and demonstrates internal power Doppler flow. The mass extends into the retroareolar region and involves the nipple, accounting for the nipple retraction.  Separate from the dominant mass at the 7 o'clock position approximately 4 cm from nipple is a hypoechoic antiparallel mass with irregular margins measuring approximately 1.4 x 2.7 x 1.7 cm.  Separate from the dominant mass at the 1 o'clock position approximately 2 cm from nipple is a hypoechoic mass with irregular margins measuring approximately 1.7 x 0.5 x 1.6 cm. Both of these masses also demonstrate internal power Doppler flow.  Sonographic evaluation of the right axilla demonstrates 2 adjacent pathologic lymph nodes, the larger measuring approximately 2.5 x 2.5 x 2.8 cm, the smaller measuring approximately 2.0 x 0.8 x 2.7 cm.    09/23/2017 Initial Biopsy    Diagnosis 1. Breast, left, needle core biopsy, 2 o'clock - INVASIVE DUCTAL CARCINOMA. - SEE COMMENT. 2. Breast, right, needle core biopsy, 11:30 o'clock - INVASIVE MAMMARY CARCINOMA. - SEE COMMENT. 3. Lymph node, needle/core biopsy, right axilla - METASTATIC MAMMARY CARCINOMA IN 1 OF 1 LYMPH NODE (1/1). Microscopic Comment 1. There is a small focus of grade I invasive ductal carcinoma. A complete breast prognostic profile will be attempted and the results reported separately. The results were called to  the [facility] on 09/24/2017. 2. The carcinoma appears grade II. An E-Cadherin stain and a breast  prognostic profile will be performed on part 2 and the results reported separately. (JBK:kh 09-24-17) [name]  1. HER2 Negative by FISH Estrogen receptor: 100% positive, strong staining Progesterone receptor: 80% positive, strong staining Ki67: 5%  2. HER2 Negative by FISH Estrogen receptor: 100% positive, strong staining Progesterone receptor: 0%, negative  Ki67: 30% 2. The tumor cells are strongly positive for E-cadherin, supporting a ductal phenotype    09/23/2017 Cancer Staging    Staging form: Breast, AJCC 8th Edition - Clinical stage from 09/23/2017: Stage IIIA (cT3, cN1, cM0, G2, ER+, PR-, HER2-) - Signed by [name], MD on 10/09/2017    10/07/2017 Breast MRI    IMPRESSION: 1. Biopsy proven malignancy involving all 4 quadrants of the right breast, although predominantly located in the anterior third of the central upper right breast measures 6.8 x 9.6 x 7.2 cm.  2. Five pathologic right axillary lymph nodes, compatible with biopsy proven axillary metastases.  3. Small mass with associated biopsy related changes in the upper-outer left breast at site of known malignancy measures 0.9 x 0.8 x 0.7 cm. No abnormal lymph nodes seen in the left axilla.  RECOMMENDATION: Treatment plan for known bilateral breast malignancy.  BI-RADS CATEGORY  6: Known biopsy-proven malignancy.    10/08/2017 Initial Diagnosis    Cancer of overlapping sites of right female breast ([place])    10/13/2017 Pathology Results    Diagnosis 1. Breast, right, needle core biopsy, 1 o'clock - INVASIVE DUCTAL CARCINOMA. - SEE COMMENT. 2. Breast, right, needle core biopsy, 7 o'clock - INVASIVE DUCTAL CARCINOMA. - SEE COMMENT. Microscopic Comment 1. and 2. The carcinoma in parts 1 and 2 is morphologically similar and appears grade II. Because breast prognostic profiles were performed on the patients previous case, [id number], it will not be repeated on the current case unless requested.     10/17/2017  Imaging    CT CAP IMPRESSION: 1. Infiltrative right breast mass with overlying skin thickening is identified compatible with known breast cancer. 2. Enlarged right axillary lymph nodes compatible with metastatic adenopathy. 3. No additional sites of disease identified within the chest and no evidence for metastasis to the abdomen or pelvis. 4. Aortic atherosclerosis and 3 vessel coronary artery atherosclerotic calcifications. Aortic Atherosclerosis (ICD10-I70.0).    10/17/2017 Imaging    Bone Scan FINDINGS: There are no foci of increased or decreased radiotracer uptake to suggest osseous metastatic disease. 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 There is degenerative type uptake within both shoulders and the left ankle.  Normal physiologic activity is identified within the kidneys and urinary bladder.  IMPRESSION: No evidence of osseous metastatic disease.    10/22/2017 - 03/11/2018 Chemotherapy    AC q2 weeks x4 cycles starting 10/22/17-12/03/17, followed by weekly taxol x12 12/17/17-03/11/18    11/14/2017 Genetic Testing    The genes that were analyzed were the 83 genes on [facility]'s Multi-Cancer panel (ALK, APC, ATM, AXIN2, BAP1, BARD1, BLM, BMPR1A, BRCA1, BRCA2, BRIP1, CASR, CDC73, CDH1, CDK4, CDKN1B, CDKN1C, CDKN2A, CEBPA, CHEK2, CTNNA1, DICER1, DIS3L2, EGFR, EPCAM, FH, FLCN, GATA2, GPC3, GREM1, HOXB13, HRAS, KIT, MAX, MEN1, MET, MITF, MLH1, MSH2, MSH3, MSH6, MUTYH, NBN, NF1, NF2, NTHL1, PALB2, PDGFRA, PHOX2B, PMS2, POLD1, POLE, POT1, PRKAR1A, PTCH1, PTEN, RAD50, RAD51C, RAD51D, RB1, RECQL4, RET, RUNX1, SDHA, SDHAF2, SDHB, SDHC, SDHD, SMAD4, SMARCA4,  SMARCB1, SMARCE1, STK11, SUFU, TERC, TERT, TMEM127, TP53, TSC1, TSC2, VHL, WRN, WT1).  Testingrevealed a pathogenic mutation in the MITF genecalled c.952G>A (p.Glu318Lys) and A Variant of Unknown Significance (VUS) was also detected: POLD1  c.301A>T (p.Ile101Phe).    12/16/2017 Breast US    IMPRESSION: 1. Stable to minimal decrease in size of multiple irregular masses throughout the right breast consistent with the patient's sites of biopsy proven malignancy. While the mammographic appearance is slightly less dense in this region, there is increased skin and trabecular thickening involving the entire right breast. 2. Stable appearance of morphologically abnormal right axillary lymph nodes, consistent with biopsy proven metastatic disease. 3. Stable appearance of a left breast mass, consistent with biopsy proven malignancy.     04/03/2018 Cancer Staging    Staging form: Breast, AJCC 8th Edition - Pathologic stage from 04/03/2018: No Stage Recommended (ypT3, pN2a, cM0, G3, ER+, PR-, HER2-) - Signed by [name], MD on 05/03/2018     Malignant neoplasm of left breast in female, estrogen receptor positive ([place])   09/09/2017 Breast US    Targeted left breast ultrasound is performed, showing an oval parallel hypoechoic mass containing microcalcifications at the 2 o'clock position approximately 6 cm from nipple measuring approximately 0.9 x 0.5 x 0.9 cm, corresponding to the mammographic finding.  Sonographic evaluation of the left axilla demonstrates no pathologic lymphadenopathy.    09/23/2017 Initial Biopsy    Diagnosis 1. Breast, left, needle core biopsy, 2 o'clock - INVASIVE DUCTAL CARCINOMA. - SEE COMMENT. 2. Breast, right, needle core biopsy, 11:30 o'clock - INVASIVE MAMMARY CARCINOMA. - SEE COMMENT. 3. Lymph node, needle/core biopsy, right axilla - METASTATIC MAMMARY CARCINOMA IN 1 OF 1 LYMPH NODE (1/1). Microscopic Comment 1. There is a small focus of grade I invasive ductal carcinoma. A complete breast prognostic profile will be attempted and the results reported separately. The results were called to the [facility] on 09/24/2017. 2. The carcinoma appears grade II. An E-Cadherin stain and a breast  prognostic profile will be performed on part 2 and the results reported separately. (JBK:kh 09-24-17) [name]  1. HER2 Negative by FISH Estrogen receptor: 100% positive, strong staining Progesterone receptor: 80% positive, strong staining Ki67: 5%  2. HER2 Negative by FISH Estrogen receptor: 100% positive, strong staining Progesterone receptor: 0%, negative  Ki67: 30%    10/08/2017 Initial Diagnosis    Malignant neoplasm of left breast in female, estrogen receptor positive ([place])    12/16/2017 Breast US    IMPRESSION: 3. Stable appearance of a left breast mass, consistent with biopsy proven malignancy.     01/22/2018 Imaging    01/22/2018 MRI Bilateral Breast IMPRESSION: 1. Dominant enhancing mass with surrounding nodularity involving the central anterior third of the right breast now measures up to 6.4 x 6.7 x 7.6 cm (previously measured 6.8 x 9.6 x 7.2 cm). Persistent bulky right axillary lymphadenopathy, slightly decreased in size since prior MRI.  2. Biopsy proven left breast malignancy now measures 0.5 cm (previously measured 0.9 x 0.8 x 0.7 cm).     04/03/2018 Pathology Results    Diagnosis 1. Breast, lumpectomy, left with radioactive seed - RESIDUAL INVASIVE DUCTAL CARCINOMA STATUS POST NEOADJUVANT THERAPY (0.9 CM) - MARGINS UNINVOLVED BY CARCINOMA (0.2 CM; INFERIOR MARGIN) - CALCIFICATIONS ASSOCIATED WITH CARCINOMA - PREVIOUS BIOPSY SITE CHANGES PRESENT - SEE ONCOLOGY TABLE AND COMMENT BELOW 2. Lymph node, sentinel, biopsy, left axillary #1 - NO CARCINOMA IDENTIFIED IN ONE LYMPH NODE (0/1) - SEE COMMENT 3. Lymph  node, sentinel, biopsy, left axillary #2 - NO CARCINOMA IDENTIFIED IN ONE LYMPH NODE (0/1) - SEE COMMENT 4. Lymph node, sentinel, biopsy, left - NO CARCINOMA IDENTIFIED IN ONE LYMPH NODE (0/1) - SEE COMMENT 5. Lymph node, sentinel, biopsy, left axillary #3 - NO CARCINOMA IDENTIFIED IN ONE LYMPH NODE (0/1) - SEE COMMENT 6. Breast, modified radical  mastectomy , right - RESIDUAL INVASIVE DUCTAL CARCINOMA STATUS POST NEOADJUVANT THERAPY (8.5 CM) - EXTENSIVE LYMPHOVASCULAR SPACE INVASION PRESENT - METASTATIC CARCINOMA INVOLVING FIVE OF FIFTEEN LYMPH NODES WITH EXTRACAPSULAR EXTENSION (5/15) - MARGINS UNINVOLVED BY CARCINOMA - SEE ONCOLOGY TABLE AND COMMENT BELOW    04/03/2018 Receptors her2    The tumor cells are NEGATIVE for Her2 (1+). Estrogen Receptor: 95%, POSITIVE, STRONG STAINING INTENSITY Progesterone Receptor: 95%, POSITIVE, STRONG STAINING INTENSITY  The tumor cells are NEGATIVE for Her2 (1+). Estrogen Receptor: 100%, POSITIVE, STRONG STAINING INTENSITY Progesterone Receptor: 0%, NEGATIVE    04/03/2018 Cancer Staging    Staging form: Breast, AJCC 8th Edition - Pathologic stage from 04/03/2018: No Stage Recommended (ypT1b, pN0, cM0, G1, ER+, PR+, HER2-) - Signed by [name], MD on 05/03/2018    04/03/2018 Surgery    Left LEFT BREAST LUMPECTOMY WITH RADIOACTIVE SEED AND LEFT SENTINEL LYMPH NODE BIOPSY with [name], MD     05/20/2018 -  Radiation Therapy    Daily adjuvant radiation treatment started 05/20/2018 with Dr.Moody.       CURRENT THERAPY  Adjuvant radiation, started in 05/20/2018.   INTERVAL HISTORY: Kylynn Street is a 63 y.o. female who is here for follow-up. Since our last visit she started adjuvant radiation on 05/20/2018 with Dr. Lisbeth Renshaw.  Today, she is here alone. Radiation is going well and is recovering well from surgery. She has a rash around her chest and shoulders and describes dryness, itchiness, and redness. Uses a cream to heal the rash. Describes having "little twingies", trouble remember things. She does not have any problems moving and went back to work and works 60 hours a week. Her last period was when she was 48.   Pertinent positives and negatives of review of systems are listed and detailed within the above HPI.  REVIEW OF SYSTEMS:   Constitutional: Denies fevers, chills or  abnormal weight loss Eyes: Denies blurriness of vision Ears, nose, mouth, throat, and face: Denies mucositis or sore throat Respiratory: Denies cough, dyspnea or wheezes Cardiovascular: Denies palpitation, chest discomfort or lower extremity swelling Gastrointestinal:  Denies nausea, heartburn or change in bowel habits Skin: (+) rash on shoulder that is itchy, red and dry Lymphatics: Denies new lymphadenopathy or easy bruising Neurological:Denies numbness, tingling or new weaknesses Behavioral/Psych: Mood is stable, no new changes   All other systems were reviewed with the patient and are negative.  MEDICAL HISTORY:  Past Medical History:  Diagnosis Date  . Adopted   . Arthritis   . breast ca dx'd 08/2017  . Genetic testing 11/14/2017   Multi-Cancer panel (83 genes) @ [facility] - Pathogenic mutation in the MITF gene  . GERD (gastroesophageal reflux disease)   . 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 BREAST LUMPECTOMY WITH RADIOACTIVE SEED AND SENTINEL LYMPH NODE BIOPSY Left 04/03/2018   Procedure: LEFT BREAST LUMPECTOMY WITH RADIOACTIVE SEED AND LEFT SENTINEL LYMPH NODE BIOPSY;  Surgeon: [name], MD;  Location: [facility];  Service: General;  Laterality: Left;  .  IR IMAGING GUIDED PORT INSERTION  10/20/2017  . IR US GUIDE VASC ACCESS LEFT  10/20/2017  . MASTECTOMY MODIFIED RADICAL Right 04/03/2018   Procedure: MASTECTOMY MODIFIED RADICAL;  Surgeon: [name], MD;  Location: [facility];  Service: General;  Laterality: Right;  . PORT-A-CATH REMOVAL N/A 04/03/2018   Procedure: REMOVAL PORT-A-CATH;  Surgeon: [name], MD;  Location: [facility];  Service: General;  Laterality: N/A;  . TONSILLECTOMY Bilateral   . TUBAL LIGATION      I have reviewed the social history and family history  with the patient and they are unchanged from previous note.  ALLERGIES:  is allergic to bee venom.  MEDICATIONS:  Current Outpatient Medications  Medication Sig Dispense Refill  . Biotin 1000 MCG CHEW Chew by mouth.    . Coenzyme Q10 100 MG capsule Take 100 mg by mouth daily.     [name] lidocaine-prilocaine (EMLA) cream Apply to affected area once 30 g 3  . vitamin B-12 (CYANOCOBALAMIN) 1000 MCG tablet Take 1,000 mcg by mouth daily.    . Ascorbic Acid (VITAMIN C) 1000 MG tablet Take 1,000 mg by mouth 3 (three) times daily.    [name] docusate sodium (COLACE) 100 MG capsule Take 100 mg by mouth 3 (three) times daily.    [name] HYDROcodone-acetaminophen (NORCO) 5-325 MG tablet Take 1-2 tablets by mouth every 6 (six) hours as needed for moderate pain or severe pain. (Patient not taking: Reported on 07/07/2018) 30 tablet 0  . hydrocortisone 2.5 % cream Apply topically 2 (two) times daily. (Patient not taking: Reported on 07/07/2018) 30 g 0  . letrozole (FEMARA) 2.5 MG tablet Take 1 tablet (2.5 mg total) by mouth daily. 30 tablet 3  . prochlorperazine (COMPAZINE) 10 MG tablet Take 1 tablet (10 mg total) by mouth every 6 (six) hours as needed (Nausea or vomiting). (Patient not taking: Reported on 07/07/2018) 60 tablet 1  . vitamin E 100 UNIT capsule Take 100 Units by mouth daily.     No current facility-administered medications for this visit.     PHYSICAL EXAMINATION: ECOG PERFORMANCE STATUS: 1 - Symptomatic but completely ambulatory  Vitals:   07/07/18 1355  BP: (!) 154/87  Pulse: 79  Resp: 18  Temp: 98.4 F (36.9 C)  SpO2: 99%   Filed Weights   07/07/18 1355  Weight: 173 lb 14.4 oz (78.9 kg)    GENERAL:alert, no distress and comfortable SKIN: skin color, texture, turgor are normal, no rashes or significant lesions (+) rash on her shoulder worse on right side  EYES: normal, Conjunctiva are pink and non-injected, sclera clear OROPHARYNX:no exudate, no erythema and lips, buccal mucosa, and tongue  normal  NECK: supple, thyroid normal size, non-tender, without nodularity LYMPH:  no palpable lymphadenopathy in the cervical, axillary or inguinal LUNGS: clear to auscultation and percussion with normal breathing effort HEART: regular rate & rhythm and no murmurs and no lower extremity edema ABDOMEN:abdomen soft, non-tender and normal bowel sounds Musculoskeletal:no cyanosis of digits and no clubbing  NEURO: alert & oriented x 3 with fluent speech, no focal motor/sensory deficits Breasts: Breast inspection showed them to be symmetrical with no nipple discharge. (+) Diffuse skin erythema and hyperpigmentation of the right breast and axilla, secondary radiation.  Palpation of the breasts and axilla revealed no obvious mass that I could appreciate.   LABORATORY DATA:  I have reviewed the data as listed CBC Latest Ref Rng & Units 07/07/2018 03/27/2018 03/11/2018  WBC 4.0 - 10.5 K/uL 5.8  6.9 6.5  Hemoglobin 12.0 - 15.0 g/dL 14.0 12.4 12.7  Hematocrit 36.0 - 46.0 % 41.7 37.0 37.7  Platelets 150 - 400 K/uL 206 206 215     CMP Latest Ref Rng & Units 07/07/2018 03/27/2018 03/11/2018  Glucose 70 - 99 mg/dL 96 92 92  BUN 8 - 23 mg/dL 18 19 25(H)  Creatinine 0.44 - 1.00 mg/dL 0.84 0.72 0.69  Sodium 135 - 145 mmol/L 141 144 142  Potassium 3.5 - 5.1 mmol/L 4.7 4.1 3.9  Chloride 98 - 111 mmol/L 104 110 109  CO2 22 - 32 mmol/L _0 Calcium 8.9 - 10.3 mg/dL 9.5 8.8(L) 8.8(L)  Total Protein 6.5 - 8.1 g/dL 6.6 5.9(L) 5.9(L)  Total Bilirubin 0.3 - 1.2 mg/dL 0.4 0.3 <0.2(L)  Alkaline Phos 38 - 126 U/L 63 56 50  AST 15 - 41 U/L _1 ALT 0 - 44 U/L _2 Surgery  04/03/2018  Left LEFT BREAST LUMPECTOMY WITH RADIOACTIVE SEED AND LEFT SENTINEL LYMPH NODE BIOPSY with [name], MD     RADIOGRAPHIC STUDIES: I have personally reviewed the radiological images as listed and agreed with the findings in the report. No results found.   ASSESSMENT & PLAN:  Darlene Beasley is a 63  y.o. female with history of  1. Bilateral breast cancer: right breast, invasiveductalcarcinoma in right, grade II, stage IIIA (cT3N1M0), ER 100% positive, PR 0%, negative, HER2 negative by FISH, Ki67 30% metastatic to 5 axillary lymph nodes, ypT3N2a; left breast - invasive ductal carcinoma, grade I, stage IA (cT1bN0M0), ER 100% positive, PR 80% positive, HER2 negative, Ki67 5%, ypT1bN0 - Diagnosed on 09/2017. 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 She completed neoadjuvant chemo with AC 4 cycles every 2 weeks on 6/19 then completed weekly taxol on 10/19. She subsequently underwent lumpectomy.  She had a very limited response to neoadjuvant chemotherapy.   -Currently receiving adjuvant radiation, started in 05/20/2018 with Dr. Lisbeth Renshaw.  She is tolerating well overall, has moderate fatigue, but is still able to work full-time.  Finishing radiation tomorrow.  --Given the strong ER and PR positivity, I do recommend adjuvant aromatase inhibitor to reduce her risk of cancer recurrence,  The potential benefit and side effects, which includes but not limited to, hot flash, skin and vaginal dryness, metabolic changes ( increased blood glucose, cholesterol, weight, etc.), slightly in increased risk of cardiovascular disease, cataracts, muscular and joint discomfort, osteopenia and osteoporosis, etc, were discussed with her in great details. She is interested, and we'll start in 2 weeks when she recovers from radiation. -she was screen her for the [place] study but was not eligible due to her bilateral breast cancer. -Labs reviewed, CBC and CMP are unremarkable.  She is clinically doing well, no concern for recurrence. -Follow-up in 3 months.  I will arrange survivorship on next visit.   2. Smoking cessation - She has a long history of smoking, she has cut down significantly, still smokes several cigarettes a day -Again encouraged her to quit smoking completely.   3.  HTN -She not currently on BP  medication -BP has been high, I encourage her to f/u with PCP   4.  Peripheral neuropathy, due to chemotherapy -much improved, overall mild, will monitor.   5. Bone health - She has a DEXA scan scheduled on 07/17/2018  Plan  -She is finishing radiation tomorrow. -I called in letrozole to her pharmacy today, she will start in 2 weeks -  Lab and follow-up in 3 months, will schedule survivorship clinic on next visit  No problem-specific Assessment & Plan notes found for this encounter.   No orders of the defined types were placed in this encounter.  All questions were answered. The patient knows to call the clinic with any problems, questions or concerns. No barriers to learning was detected. I spent 20 minutes counseling the patient face to face. The total time spent in the appointment was 25 minutes and more than 50% was on counseling and review of test results  I, Manson Allan am acting as scribe for Dr. [name].  I have reviewed the above documentation for accuracy and completeness, and I agree with the above.     [name], MD 07/07/2018

## 2018-07-08 ENCOUNTER — Encounter: Payer: Self-pay | Admitting: Radiation Oncology

## 2018-07-08 ENCOUNTER — Ambulatory Visit
Admission: RE | Admit: 2018-07-08 | Discharge: 2018-07-08 | Disposition: A | Discharge status: Home / Self Care | Payer: Medicaid Other | Source: Ambulatory Visit | Attending: Radiation Oncology | Admitting: Radiation Oncology

## 2018-07-08 ENCOUNTER — Telehealth: Payer: Self-pay | Admitting: Hematology

## 2018-07-08 ENCOUNTER — Ambulatory Visit: Payer: Medicaid Other

## 2018-07-08 ENCOUNTER — Encounter: Payer: Self-pay | Admitting: *Deleted

## 2018-07-08 DIAGNOSIS — Z510 Encounter for antineoplastic radiation therapy: Secondary | ICD-10-CM | POA: Diagnosis not present

## 2018-07-08 NOTE — Progress Notes (Signed)
Late note from 07/07/2018 - [name] called the research nurse and asked the nurse to give the pt the [facility] consent form.  The pt was given a brief overview of the study, and she was given the consent form.  The research nurse later discovered that the pt had already been screened for this trial, and the pt was not eligible b/c she has bilateral disease.  Tyrell Antonio, study coordinator, confirmed that the pt is not eligible for the trial. [name] was informed that the pt is not eligible.  [name] asked the nurse to contact the pt and inform her that she does not meet trial eligibility.  The research nurse attempted to reach the pt this morning (07/08/18), but the pt was at work. The nurse left a message with a family member to have the pt return the nurse's call at her convenience. Brion Aliment RN, BSN, CCRP Clinical Research Nurse 07/08/2018 9:01 AM

## 2018-07-08 NOTE — Telephone Encounter (Signed)
No los per 01/28. °

## 2018-07-17 ENCOUNTER — Inpatient Hospital Stay: Admission: RE | Admit: 2018-07-17 | Payer: Medicaid Other | Source: Ambulatory Visit

## 2018-07-17 ENCOUNTER — Ambulatory Visit
Admission: RE | Admit: 2018-07-17 | Discharge: 2018-07-17 | Disposition: A | Discharge status: Home / Self Care | Payer: Medicaid Other | Source: Ambulatory Visit | Attending: Hematology | Admitting: Hematology

## 2018-07-17 DIAGNOSIS — E2839 Other primary ovarian failure: Secondary | ICD-10-CM

## 2018-07-20 ENCOUNTER — Telehealth: Payer: Self-pay

## 2018-07-20 ENCOUNTER — Telehealth: Payer: Self-pay | Admitting: Hematology

## 2018-07-20 NOTE — Telephone Encounter (Signed)
-----   Message from [name], MD sent at 07/18/2018  4:15 PM EST ----- Please let pt know she has osteopenia, and I recommend her to start calcium and vit D 1000u daily, OTC, thanks. I also sent a schedule message for her f/u appointment   [name]

## 2018-07-20 NOTE — Telephone Encounter (Signed)
Scheduled appt per 2/08 sch message - sent reminder letter in the mail with appt date and time

## 2018-07-20 NOTE — Telephone Encounter (Signed)
Spoke with patient's significant other regarding results, per [name] notified him that bone density scan does show she has osteopenia, recommend she start taking an OTC calcium and vitamin D 1000u daily.  Scheduling will be calling her with a f/u appointment.    He verbalized an understanding and states he will give the patient the message.

## 2018-08-17 ENCOUNTER — Ambulatory Visit: Payer: Self-pay | Admitting: Radiation Oncology

## 2018-08-24 ENCOUNTER — Encounter (HOSPITAL_COMMUNITY): Payer: Self-pay | Admitting: *Deleted

## 2018-08-25 ENCOUNTER — Telehealth: Payer: Self-pay | Admitting: Radiation Oncology

## 2018-08-25 NOTE — Telephone Encounter (Signed)
I called and spoke with the patient's spouse and he indicated she was at work and has been doing well since completing her radiation therapy. We will cancel her appt tomorrow due to concerns of coronavirus and he will give her my phone number so she can call me to discuss how she's doing.

## 2018-08-25 NOTE — Progress Notes (Signed)
  Radiation Oncology         [phone] ________________________________  Name: Darlene Beasley MRN: [id number]  Date: 07/08/2018  DOB: [birth year]  End of Treatment Note  Diagnosis:   63 y.o. 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 This was delivered using a 3-D conformal, 4 field technique. The patient then received a boost to the mastectomy scar. This delivered an additional 10 Gy in 5 fractions using an en face electron field. The total dose was 60.4 Gy. At the same time, the patient received a dose of 50.4 Gy in 28 fractions to the left breast using whole-breast tangent fields. This was delivered using a 3-D conformal technique. The patient then received a boost to the seroma. This delivered an additional 10 Gy in 5 fractions using 15E electrons with a special teletherapy technique. The total dose was 60.4 Gy.  Narrative: The patient tolerated radiation treatment relatively well.   The patient had some expected skin irritation with diffuse erythema and dry desquamation as she progressed during treatment. Moist desquamation was not present at the end of treatment. She is applying Radiaplex and hydrocortisone cream to her skin as needed. She also noted increased fatigue and some breast tenderness.  Plan: The patient has completed radiation treatment. The patient will return to radiation oncology clinic for routine followup in one month. I advised the patient to call or return sooner if they have any  questions or concerns related to their recovery or treatment. ________________________________  Jodelle Gross, MD, PhD  This document serves as a record of services personally performed by Kyung Rudd, MD. It was created on his behalf by Rae Lips, a trained medical scribe. The creation of this record is based on the scribe's personal observations and the provider's statements to them. This document has been checked and approved by the attending provider.

## 2018-08-26 ENCOUNTER — Telehealth: Payer: Self-pay | Admitting: Radiation Oncology

## 2018-08-26 NOTE — Telephone Encounter (Signed)
I called the patient back to see how she was doing. She's doing well and doesn't have concerns about her skin at this time. We discussed skin care and considerations to avoid sun exposure. We will see her back prn and encouraged her to call us if she has questions or concern.

## 2018-08-27 ENCOUNTER — Ambulatory Visit: Payer: Medicaid Other | Admitting: Radiation Oncology

## 2018-10-01 ENCOUNTER — Encounter: Payer: Self-pay | Admitting: General Practice

## 2018-10-01 NOTE — Progress Notes (Signed)
[facility] Support Services   [facility] Support Services Team contacted patient to assess for food insecurity and other psychosocial needs during current COVID19 pandemic.    Patient/family expressed no needs at this time.  Support Team member encouraged patient to call if changes occur or they have any other questions/concerns.    Adela Esteban C Tarvares Lant, LCSW  [facility]        

## 2018-10-15 ENCOUNTER — Telehealth: Payer: Self-pay | Admitting: Hematology

## 2018-10-15 NOTE — Telephone Encounter (Signed)
Attempted to reach pt re: 10/19/18 appt and could not leave vm cause it's not set up. Please try back to convert appt to Webex mtg.

## 2018-10-16 ENCOUNTER — Telehealth: Payer: Self-pay | Admitting: Oncology

## 2018-10-16 ENCOUNTER — Telehealth: Payer: Self-pay | Admitting: Hematology

## 2018-10-16 NOTE — Telephone Encounter (Signed)
Called regarding upcoming Webex appointment, patient does not have VM set up. This may be a telephone visit due to no communication from patient.

## 2018-10-16 NOTE — Progress Notes (Signed)
[place]   [phone] [phone]   Clinic Follow up Note   Patient Care Team: [name], MD as PCP - General (Family Medicine) [name], MD as Consulting Physician (General Surgery) [name], MD as Consulting Physician (Hematology)   I connected with [name] on 10/19/2018 at  2:15 PM EDT by telephone visit and verified that I am speaking with the correct person using two identifiers.  I discussed the limitations, risks, security and privacy concerns of performing an evaluation and management service by telephone and the availability of in person appointments. I also discussed with the patient that there may be a patient responsible charge related to this service. The patient expressed understanding and agreed to proceed.   Patient's location:  Her home  Provider's location:  My Office  CHIEF COMPLAINT: F/u on bilateral breast cancer  SUMMARY OF ONCOLOGIC HISTORY: Oncology History   Cancer Staging Cancer of overlapping sites of right female breast [facility]) Staging form: Breast, AJCC 8th Edition - Clinical stage from 09/23/2017: Stage IIIA (cT3, cN1, cM0, G2, ER+, PR-, HER2-) - Signed by [name], MD on 10/09/2017 - Pathologic stage from 04/03/2018: No Stage Recommended (ypT3, pN2a, cM0, G3, ER+, PR-, HER2-) - Signed by [name], MD on 05/03/2018  Malignant neoplasm of left breast in female, estrogen receptor positive ([place]) Staging form: Breast, AJCC 8th Edition - Clinical: Stage IA (cT1b, cN0, cM0, G1, ER+, PR+, HER2-) - Unsigned - Pathologic stage from 04/03/2018: No Stage Recommended (ypT1b, pN0, cM0, G1, ER+, PR+, HER2-) - Signed by [name], MD on 05/03/2018       Cancer of overlapping sites of right female breast ([place])   09/09/2017 Mammogram    IMPRESSION: 1. Highly suspicious large right breast mass involving the outer and upper breast extending from approximately 9 o'clock through 12-1 o'clock, maximum measurement  approximating 7.5 cm. The mass involves the right nipple, accounting for nipple retraction. Architectural distortion, microcalcifications and diffuse trabecular thickening and skin thickening are associated with the large mass, raising the possibility of this representing an inflammatory cancer. 2. Satellite masses separate from the dominant mass involving the lower outer quadrant and the upper inner quadrant of the right breast, measured above. 3. Two adjacent pathologic right axillary lymph nodes. 4. Indeterminate 0.9 cm solid mass involving the upper outer quadrant of the left breast which accounts for a mammographic finding. 5. No pathologic left axillary lymphadenopathy.    09/09/2017 Breast US    Targeted right breast ultrasound is performed, showing a very large hypoechoic mass with irregular margins extending from the approximate 9 o'clock position through the 12 to 1 o'clock position. On the CC gait image, the mass measures maximally approximately 7.5 cm. The mass has irregular margins, demonstrates acoustic shadowing, and demonstrates internal power Doppler flow. The mass extends into the retroareolar region and involves the nipple, accounting for the nipple retraction.  Separate from the dominant mass at the 7 o'clock position approximately 4 cm from nipple is a hypoechoic antiparallel mass with irregular margins measuring approximately 1.4 x 2.7 x 1.7 cm.  Separate from the dominant mass at the 1 o'clock position approximately 2 cm from nipple is a hypoechoic mass with irregular margins measuring approximately 1.7 x 0.5 x 1.6 cm. Both of these masses also demonstrate internal power Doppler flow.  Sonographic evaluation of the right axilla demonstrates 2 adjacent pathologic lymph nodes, the larger measuring approximately 2.5 x 2.5 x 2.8 cm, the smaller measuring approximately 2.0  x 0.8 x 2.7 cm.    09/23/2017 Initial Biopsy    Diagnosis 1. Breast, left, needle core biopsy, 2 o'clock -  INVASIVE DUCTAL CARCINOMA. - SEE COMMENT. 2. Breast, right, needle core biopsy, 11:30 o'clock - INVASIVE MAMMARY CARCINOMA. - SEE COMMENT. 3. Lymph node, needle/core biopsy, right axilla - METASTATIC MAMMARY CARCINOMA IN 1 OF 1 LYMPH NODE (1/1). Microscopic Comment 1. There is a small focus of grade I invasive ductal carcinoma. A complete breast prognostic profile will be attempted and the results reported separately. The results were called to the [facility] on 09/24/2017. 2. The carcinoma appears grade II. An E-Cadherin stain and a breast prognostic profile will be performed on part 2 and the results reported separately. (JBK:kh 09-24-17) [name]  1. HER2 Negative by FISH Estrogen receptor: 100% positive, strong staining Progesterone receptor: 80% positive, strong staining Ki67: 5%  2. HER2 Negative by FISH Estrogen receptor: 100% positive, strong staining Progesterone receptor: 0%, negative  Ki67: 30% 2. The tumor cells are strongly positive for E-cadherin, supporting a ductal phenotype    09/23/2017 Cancer Staging    Staging form: Breast, AJCC 8th Edition - Clinical stage from 09/23/2017: Stage IIIA (cT3, cN1, cM0, G2, ER+, PR-, HER2-) - Signed by [name], MD on 10/09/2017    10/07/2017 Breast MRI    IMPRESSION: 1. Biopsy proven malignancy involving all 4 quadrants of the right breast, although predominantly located in the anterior third of the central upper right breast measures 6.8 x 9.6 x 7.2 cm.  2. Five pathologic right axillary lymph nodes, compatible with biopsy proven axillary metastases.  3. Small mass with associated biopsy related changes in the upper-outer left breast at site of known malignancy measures 0.9 x 0.8 x 0.7 cm. No abnormal lymph nodes seen in the left axilla.  RECOMMENDATION: Treatment plan for known bilateral breast malignancy.  BI-RADS CATEGORY  6: Known biopsy-proven malignancy.    10/08/2017 Initial Diagnosis    Cancer of  overlapping sites of right female breast ([place])    10/13/2017 Pathology Results    Diagnosis 1. Breast, right, needle core biopsy, 1 o'clock - INVASIVE DUCTAL CARCINOMA. - SEE COMMENT. 2. Breast, right, needle core biopsy, 7 o'clock - INVASIVE DUCTAL CARCINOMA. - SEE COMMENT. Microscopic Comment 1. and 2. The carcinoma in parts 1 and 2 is morphologically similar and appears grade II. Because breast prognostic profiles were performed on the patients previous case, [id number], it will not be repeated on the current case unless requested.     10/17/2017 Imaging    CT CAP IMPRESSION: 1. Infiltrative right breast mass with overlying skin thickening is identified compatible with known breast cancer. 2. Enlarged right axillary lymph nodes compatible with metastatic adenopathy. 3. No additional sites of disease identified within the chest and no evidence for metastasis to the abdomen or pelvis. 4. Aortic atherosclerosis and 3 vessel coronary artery atherosclerotic calcifications. Aortic Atherosclerosis (ICD10-I70.0).    10/17/2017 Imaging    Bone Scan FINDINGS: There are no foci of increased or decreased radiotracer uptake to suggest osseous metastatic disease. 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 There is degenerative type uptake within both shoulders and the left ankle.  Normal physiologic activity is identified within the kidneys and urinary bladder.  IMPRESSION: No evidence of osseous metastatic disease.    10/22/2017 - 03/11/2018 Chemotherapy    AC q2 weeks x4 cycles starting 10/22/17-12/03/17, followed by weekly  taxol x12 12/17/17-03/11/18    11/14/2017 Genetic Testing    The genes that were analyzed were the 83 genes on [facility]'s Multi-Cancer panel (ALK, APC, ATM, AXIN2, BAP1, BARD1, BLM, BMPR1A, BRCA1, BRCA2, BRIP1, CASR, CDC73, CDH1, CDK4, CDKN1B, CDKN1C, CDKN2A, CEBPA, CHEK2, CTNNA1,  DICER1, DIS3L2, EGFR, EPCAM, FH, FLCN, GATA2, GPC3, GREM1, HOXB13, HRAS, KIT, MAX, MEN1, MET, MITF, MLH1, MSH2, MSH3, MSH6, MUTYH, NBN, NF1, NF2, NTHL1, PALB2, PDGFRA, PHOX2B, PMS2, POLD1, POLE, POT1, PRKAR1A, PTCH1, PTEN, RAD50, RAD51C, RAD51D, RB1, RECQL4, RET, RUNX1, SDHA, SDHAF2, SDHB, SDHC, SDHD, SMAD4, SMARCA4, SMARCB1, SMARCE1, STK11, SUFU, TERC, TERT, TMEM127, TP53, TSC1, TSC2, VHL, WRN, WT1).  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    12/16/2017 Breast US    IMPRESSION: 1. Stable to minimal decrease in size of multiple irregular masses throughout the right breast consistent with the patient's sites of biopsy proven malignancy. While the mammographic appearance is slightly less dense in this region, there is increased skin and trabecular thickening involving the entire right breast. 2. Stable appearance of morphologically abnormal right axillary lymph nodes, consistent with biopsy proven metastatic disease. 3. Stable appearance of a left breast mass, consistent with biopsy proven malignancy.     04/03/2018 Cancer Staging    Staging form: Breast, AJCC 8th Edition - Pathologic stage from 04/03/2018: No Stage Recommended (ypT3, pN2a, cM0, G3, ER+, PR-, HER2-) - Signed by [name], MD on 05/03/2018    10/19/2018 -  Anti-estrogen oral therapy    Adjuvant letrozole 2.'5mg'$  daily      Malignant neoplasm of left breast in female, estrogen receptor positive ([place])   09/09/2017 Breast US    Targeted left breast ultrasound is performed, showing an oval parallel hypoechoic mass containing microcalcifications at the 2 o'clock position approximately 6 cm from nipple measuring approximately 0.9 x 0.5 x 0.9 cm, corresponding to the mammographic finding.  Sonographic evaluation of the left axilla demonstrates no pathologic lymphadenopathy.    09/23/2017 Initial Biopsy     Diagnosis 1. Breast, left, needle core biopsy, 2 o'clock - INVASIVE DUCTAL CARCINOMA. - SEE COMMENT. 2. Breast, right, needle core biopsy, 11:30 o'clock - INVASIVE MAMMARY CARCINOMA. - SEE COMMENT. 3. Lymph node, needle/core biopsy, right axilla - METASTATIC MAMMARY CARCINOMA IN 1 OF 1 LYMPH NODE (1/1). Microscopic Comment 1. There is a small focus of grade I invasive ductal carcinoma. A complete breast prognostic profile will be attempted and the results reported separately. The results were called to the [facility] on 09/24/2017. 2. The carcinoma appears grade II. An E-Cadherin stain and a breast prognostic profile will be performed on part 2 and the results reported separately. (JBK:kh 09-24-17) [name]  1. HER2 Negative by FISH Estrogen receptor: 100% positive, strong staining Progesterone receptor: 80% positive, strong staining Ki67: 5%  2. HER2 Negative by FISH Estrogen receptor: 100% positive, strong staining Progesterone receptor: 0%, negative  Ki67: 30%    10/08/2017 Initial Diagnosis    Malignant neoplasm of left breast in female, estrogen receptor positive ([place])    12/16/2017 Breast US    IMPRESSION: 3. Stable appearance of a left breast mass, consistent with biopsy proven malignancy.     01/22/2018 Imaging    01/22/2018 MRI Bilateral Breast IMPRESSION: 1. Dominant enhancing mass with surrounding nodularity involving the central anterior third of the right breast now measures up to 6.4 x 6.7 x 7.6 cm (previously measured 6.8 x 9.6  x 7.2 cm). Persistent bulky right axillary lymphadenopathy, slightly decreased in size since prior MRI.  2. Biopsy proven left breast malignancy now measures 0.5 cm (previously measured 0.9 x 0.8 x 0.7 cm).     04/03/2018 Pathology Results    Diagnosis 1. Breast, lumpectomy, left with radioactive seed - RESIDUAL INVASIVE DUCTAL CARCINOMA STATUS POST NEOADJUVANT THERAPY (0.9 CM) - MARGINS UNINVOLVED BY CARCINOMA  (0.2 CM; INFERIOR MARGIN) - CALCIFICATIONS ASSOCIATED WITH CARCINOMA - PREVIOUS BIOPSY SITE CHANGES PRESENT - SEE ONCOLOGY TABLE AND COMMENT BELOW 2. Lymph node, sentinel, biopsy, left axillary #1 - NO CARCINOMA IDENTIFIED IN ONE LYMPH NODE (0/1) - SEE COMMENT 3. Lymph node, sentinel, biopsy, left axillary #2 - NO CARCINOMA IDENTIFIED IN ONE LYMPH NODE (0/1) - SEE COMMENT 4. Lymph node, sentinel, biopsy, left - NO CARCINOMA IDENTIFIED IN ONE LYMPH NODE (0/1) - SEE COMMENT 5. Lymph node, sentinel, biopsy, left axillary #3 - NO CARCINOMA IDENTIFIED IN ONE LYMPH NODE (0/1) - SEE COMMENT 6. Breast, modified radical mastectomy , right - RESIDUAL INVASIVE DUCTAL CARCINOMA STATUS POST NEOADJUVANT THERAPY (8.5 CM) - EXTENSIVE LYMPHOVASCULAR SPACE INVASION PRESENT - METASTATIC CARCINOMA INVOLVING FIVE OF FIFTEEN LYMPH NODES WITH EXTRACAPSULAR EXTENSION (5/15) - MARGINS UNINVOLVED BY CARCINOMA - SEE ONCOLOGY TABLE AND COMMENT BELOW    04/03/2018 Receptors her2    The tumor cells are NEGATIVE for Her2 (1+). Estrogen Receptor: 95%, POSITIVE, STRONG STAINING INTENSITY Progesterone Receptor: 95%, POSITIVE, STRONG STAINING INTENSITY  The tumor cells are NEGATIVE for Her2 (1+). Estrogen Receptor: 100%, POSITIVE, STRONG STAINING INTENSITY Progesterone Receptor: 0%, NEGATIVE    04/03/2018 Cancer Staging    Staging form: Breast, AJCC 8th Edition - Pathologic stage from 04/03/2018: No Stage Recommended (ypT1b, pN0, cM0, G1, ER+, PR+, HER2-) - Signed by [name], MD on 05/03/2018    04/03/2018 Surgery    Left LEFT BREAST LUMPECTOMY WITH RADIOACTIVE SEED AND LEFT SENTINEL LYMPH NODE BIOPSY with [name], MD     05/20/2018 - 07/08/2018 Radiation Therapy    Daily adjuvant radiation treatment with Dr.Moody.    Radiation treatment dates:   05/20/2018 - 07/08/2018  Site/dose:   The patient initially received a dose of 50.4 Gy in 28 fractions to the right chest wall and supraclavicular  region. This was delivered using a 3-D conformal, 4 field technique. The patient then received a boost to the mastectomy scar. This delivered an additional 10 Gy in 5 fractions using an en face electron field. The total dose was 60.4 Gy. At the same time, the patient received a dose of 50.4 Gy in 28 fractions to the left breast using whole-breast tangent fields. This was delivered using a 3-D conformal technique. The patient then received a boost to the seroma. This delivered an additional 10 Gy in 5 fractions using 15E electrons with a special teletherapy technique. The total dose was 60.4 Gy.    10/19/2018 -  Anti-estrogen oral therapy    Letrozole 2.'5mg'$  daily started 07/2018       CURRENT THERAPY:  Letrozole 2.'5mg'$  daily starting 10/2018  INTERVAL HISTORY:  Dima Mini is here for a follow up of bilateral breast cancer. She was able to identify herself by birth date. She notes her letrozole was not at her pharmacy to pick up so she did not start Letrozole and did not call. She notes she is recovering well from treatment. She notes having hot flashes, skin is changing back to normal from radiation. She is able to be more active now with adequate  energy. She does note pain from arthritis since completing chemo.    REVIEW OF SYSTEMS:   Constitutional: Denies fevers, chills or abnormal weight loss (+) Hot flashes  Eyes: Denies blurriness of vision Ears, nose, mouth, throat, and face: Denies mucositis or sore throat Respiratory: Denies cough, dyspnea or wheezes Cardiovascular: Denies palpitation, chest discomfort or lower extremity swelling Gastrointestinal:  Denies nausea, heartburn or change in bowel habits Skin: Denies abnormal skin rashes MSK: (+) arthritis  Lymphatics: Denies new lymphadenopathy or easy bruising Neurological:Denies numbness, tingling or new weaknesses Behavioral/Psych: Mood is stable, no new changes  All other systems were reviewed with the patient and are negative.   MEDICAL HISTORY:  Past Medical History:  Diagnosis Date  . Adopted   . Arthritis   . breast ca dx'd 08/2017  . Genetic testing 11/14/2017   Multi-Cancer panel (83 genes) @ [facility] - Pathogenic mutation in the MITF gene  . GERD (gastroesophageal reflux disease)   . 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 BREAST LUMPECTOMY WITH RADIOACTIVE SEED AND SENTINEL LYMPH NODE BIOPSY Left 04/03/2018   Procedure: LEFT BREAST LUMPECTOMY WITH RADIOACTIVE SEED AND LEFT SENTINEL LYMPH NODE BIOPSY;  Surgeon: [name], MD;  Location: [place];  Service: General;  Laterality: Left;  . IR IMAGING GUIDED PORT INSERTION  10/20/2017  . IR US GUIDE VASC ACCESS LEFT  10/20/2017  . MASTECTOMY MODIFIED RADICAL Right 04/03/2018   Procedure: MASTECTOMY MODIFIED RADICAL;  Surgeon: [name], MD;  Location: [place];  Service: General;  Laterality: Right;  . PORT-A-CATH REMOVAL N/A 04/03/2018   Procedure: REMOVAL PORT-A-CATH;  Surgeon: [name], MD;  Location: [place];  Service: General;  Laterality: N/A;  . TONSILLECTOMY Bilateral   . TUBAL LIGATION      I have reviewed the social history and family history with the patient and they are unchanged from previous note.  ALLERGIES:  is allergic to bee venom.  MEDICATIONS:  Current Outpatient Medications  Medication Sig Dispense Refill  . Ascorbic Acid (VITAMIN C) 1000 MG tablet Take 1,000 mg by mouth 3 (three) times daily.    . Biotin 1000 MCG CHEW Chew by mouth.    . Coenzyme Q10 100 MG capsule Take 100 mg by mouth daily.     [name] docusate sodium (COLACE) 100 MG capsule Take 100 mg by mouth 3 (three) times daily.    [name] HYDROcodone-acetaminophen (NORCO) 5-325 MG tablet Take 1-2 tablets by mouth every 6 (six) hours as needed for moderate pain or severe pain.  (Patient not taking: Reported on 07/07/2018) 30 tablet 0  . hydrocortisone 2.5 % cream Apply topically 2 (two) times daily. (Patient not taking: Reported on 07/07/2018) 30 g 0  . letrozole (FEMARA) 2.5 MG tablet Take 1 tablet (2.5 mg total) by mouth daily. 30 tablet 3  . lidocaine-prilocaine (EMLA) cream Apply to affected area once 30 g 3  . prochlorperazine (COMPAZINE) 10 MG tablet Take 1 tablet (10 mg total) by mouth every 6 (six) hours as needed (Nausea or vomiting). (Patient not taking: Reported on 07/07/2018) 60 tablet 1  . vitamin B-12 (CYANOCOBALAMIN) 1000 MCG tablet Take 1,000 mcg by mouth daily.    . vitamin E 100 UNIT capsule Take 100 Units by mouth daily.     No current facility-administered medications for this visit.     PHYSICAL EXAMINATION: ECOG  PERFORMANCE STATUS: 0 - Asymptomatic  No vitals taken today, Exam not performed today  LABORATORY DATA:  I have reviewed the data as listed CBC Latest Ref Rng & Units 07/07/2018 03/27/2018 03/11/2018  WBC 4.0 - 10.5 K/uL 5.8 6.9 6.5  Hemoglobin 12.0 - 15.0 g/dL 14.0 12.4 12.7  Hematocrit 36.0 - 46.0 % 41.7 37.0 37.7  Platelets 150 - 400 K/uL 206 206 215     CMP Latest Ref Rng & Units 07/07/2018 03/27/2018 03/11/2018  Glucose 70 - 99 mg/dL 96 92 92  BUN 8 - 23 mg/dL 18 19 25(H)  Creatinine 0.44 - 1.00 mg/dL 0.84 0.72 0.69  Sodium 135 - 145 mmol/L 141 144 142  Potassium 3.5 - 5.1 mmol/L 4.7 4.1 3.9  Chloride 98 - 111 mmol/L 104 110 109  CO2 22 - 32 mmol/L '30 26 26  '$ Calcium 8.9 - 10.3 mg/dL 9.5 8.8(L) 8.8(L)  Total Protein 6.5 - 8.1 g/dL 6.6 5.9(L) 5.9(L)  Total Bilirubin 0.3 - 1.2 mg/dL 0.4 0.3 <0.2(L)  Alkaline Phos 38 - 126 U/L 63 56 50  AST 15 - 41 U/L '15 16 17  '$ ALT 0 - 44 U/L '14 16 19      '$ RADIOGRAPHIC STUDIES: I have personally reviewed the radiological images as listed and agreed with the findings in the report. No results found.   ASSESSMENT & PLAN:  Darlene Beasley is a 63 y.o. female with   1.Bilateral  breast cancer: right breast, invasiveductalcarcinoma in right, grade II, stage IIIA (cT3N1M0), ER 100% positive, PR 0%, negative, HER2 negative by FISH, Ki67 30% metastatic to 5 axillary lymph nodes, ypT3N2a; left breast - invasive ductal carcinoma, grade I, stage IA (cT1bN0M0), ER 100% positive, PR 80% positive, HER2 negative, Ki67 5%, ypT1bN0 -Diagnosed on 09/2017. 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  -She is s/p neoadjuvant chemo with AC-T, underwent left breast lumpectomy and right mastectomy.   She had a very limited response to neoadjuvant chemotherapy. She also completed adjuvant radiation.  -She was not able to pick up her prescription with anti-estrogen therapy Letrozole in Feb 2020. Plan to start in 10/2018. I again reviewed side effects in great detail with her. I discussed watching for worsening arthritis pain, hot flashes and weight gain. She agrees to start  -She is clinically doing well and stable. There is no clinical concern for recurrence.  -Continue surveillance. Next mammogram in summer 2020 -I reviewed COVID-19 precautions and encouraged her to continue social distancing.  -I offered her the chance to attend Breast caner survivorship with NP Lacie. She is interested. Plan for 2 months   -F/u with me in 5 months   2. Smoking cessation  -She has a long history of smoking, she has cut down significantly, still smokes several cigarettes a day -Again encouraged her to quit smoking completely.   3.HTN -She not currently on BP medication -I encourage her to f/u with PCP  4.Peripheral neuropathy,due to chemotherapy -Much improved, overall mild, will monitor.  5. Osteopenia -Her 07/2018 DEXA shows osteopenia with lowest T-score of -2.1 at right femur neck  -continue calcium and VitD  Plan  -I called in Letrozole today for her to start as soon as she receives it  -Lab and Survivorship clinic in 2 months -Lab and f/u with me in 5 months  -mammogram in 2-4 weeks     No problem-specific Assessment & Plan notes found for this encounter.   Orders Placed This Encounter  Procedures  . MM DIAG  BREAST TOMO BILATERAL    Standing Status:   Future    Standing Expiration Date:   10/19/2019    Order Specific Question:   Reason for Exam (SYMPTOM  OR DIAGNOSIS REQUIRED)    Answer:   screening    Order Specific Question:   Preferred imaging location?    Answer:   [facility]   I discussed the assessment and treatment plan with the patient. The patient was provided an opportunity to ask questions and all were answered. The patient agreed with the plan and demonstrated an understanding of the instructions.  The patient was advised to call back or seek an in-person evaluation if the symptoms worsen or if the condition fails to improve as anticipated.  I provided 15 minutes of non face-to-face telephone visit time during this encounter, and > 50% was spent counseling as documented under my assessment & plan.    [name], MD 10/19/2018   I, Joslyn Devon, am acting as scribe for [name], MD.   I have reviewed the above documentation for accuracy and completeness, and I agree with the above.

## 2018-10-16 NOTE — Telephone Encounter (Signed)
Opened by accident. Disregard this.

## 2018-10-19 ENCOUNTER — Other Ambulatory Visit: Payer: Medicaid Other

## 2018-10-19 ENCOUNTER — Telehealth: Payer: Self-pay | Admitting: Hematology

## 2018-10-19 ENCOUNTER — Encounter: Payer: Self-pay | Admitting: Hematology

## 2018-10-19 ENCOUNTER — Inpatient Hospital Stay: Payer: Medicaid Other | Attending: Hematology | Admitting: Hematology

## 2018-10-19 DIAGNOSIS — Z170 Estrogen receptor positive status [ER+]: Secondary | ICD-10-CM | POA: Diagnosis not present

## 2018-10-19 DIAGNOSIS — Z9221 Personal history of antineoplastic chemotherapy: Secondary | ICD-10-CM | POA: Diagnosis not present

## 2018-10-19 DIAGNOSIS — I10 Essential (primary) hypertension: Secondary | ICD-10-CM

## 2018-10-19 DIAGNOSIS — C50412 Malignant neoplasm of upper-outer quadrant of left female breast: Secondary | ICD-10-CM

## 2018-10-19 DIAGNOSIS — C50811 Malignant neoplasm of overlapping sites of right female breast: Secondary | ICD-10-CM

## 2018-10-19 DIAGNOSIS — M8580 Other specified disorders of bone density and structure, unspecified site: Secondary | ICD-10-CM

## 2018-10-19 DIAGNOSIS — F17210 Nicotine dependence, cigarettes, uncomplicated: Secondary | ICD-10-CM

## 2018-10-19 MED ORDER — LETROZOLE 2.5 MG PO TABS: 2.5000 mg | ORAL_TABLET | Freq: Every day | ORAL | 3 refills | Status: DC

## 2018-10-19 NOTE — Telephone Encounter (Signed)
Scheduled appt per 5/11 los. ° °A calendar will be mailed out. °

## 2018-10-19 NOTE — Telephone Encounter (Signed)
Patient called regarding Webex appointment, patient does not have access for Webex and needs this to be a telephone visit.

## 2018-10-20 ENCOUNTER — Encounter: Payer: Self-pay | Admitting: *Deleted

## 2018-11-12 DIAGNOSIS — C50412 Malignant neoplasm of upper-outer quadrant of left female breast: Secondary | ICD-10-CM | POA: Diagnosis not present

## 2018-11-12 DIAGNOSIS — C50811 Malignant neoplasm of overlapping sites of right female breast: Secondary | ICD-10-CM | POA: Diagnosis not present

## 2018-11-12 DIAGNOSIS — Z720 Tobacco use: Secondary | ICD-10-CM | POA: Diagnosis not present

## 2018-12-07 ENCOUNTER — Ambulatory Visit (INDEPENDENT_AMBULATORY_CARE_PROVIDER_SITE_OTHER): Payer: Medicare Other | Admitting: Family Medicine

## 2018-12-07 ENCOUNTER — Ambulatory Visit
Admission: RE | Admit: 2018-12-07 | Discharge: 2018-12-07 | Disposition: A | Discharge status: Home / Self Care | Payer: Medicare Other | Source: Ambulatory Visit | Attending: Family Medicine | Admitting: Family Medicine

## 2018-12-07 ENCOUNTER — Other Ambulatory Visit: Payer: Self-pay

## 2018-12-07 VITALS — BP 152/90 | HR 100 | Temp 98.7°F | Resp 16 | Ht 64.0 in | Wt 183.0 lb

## 2018-12-07 DIAGNOSIS — S3993XA Unspecified injury of pelvis, initial encounter: Secondary | ICD-10-CM | POA: Diagnosis not present

## 2018-12-07 DIAGNOSIS — S300XXA Contusion of lower back and pelvis, initial encounter: Secondary | ICD-10-CM | POA: Diagnosis not present

## 2018-12-07 DIAGNOSIS — R030 Elevated blood-pressure reading, without diagnosis of hypertension: Secondary | ICD-10-CM

## 2018-12-07 DIAGNOSIS — M533 Sacrococcygeal disorders, not elsewhere classified: Secondary | ICD-10-CM | POA: Diagnosis not present

## 2018-12-07 DIAGNOSIS — W19XXXA Unspecified fall, initial encounter: Secondary | ICD-10-CM | POA: Diagnosis not present

## 2018-12-07 DIAGNOSIS — R102 Pelvic and perineal pain: Secondary | ICD-10-CM | POA: Diagnosis not present

## 2018-12-07 DIAGNOSIS — M545 Low back pain: Secondary | ICD-10-CM | POA: Diagnosis not present

## 2018-12-07 DIAGNOSIS — S3992XA Unspecified injury of lower back, initial encounter: Secondary | ICD-10-CM | POA: Diagnosis not present

## 2018-12-07 MED ORDER — MELOXICAM 7.5 MG PO TABS: 7.5000 mg | ORAL_TABLET | Freq: Every day | ORAL | 0 refills | Status: DC

## 2018-12-07 NOTE — Progress Notes (Signed)
Subjective:    Patient ID: Darlene Beasley, female    DOB: 16-May-1956, 63 y.o.   MRN: [id number]  HPI [name] is a 63 y.o. female Presents today for: Chief Complaint  Patient presents with   Hip Pain    lower back on the left side. pt states it hurts when she walks/ x1 wk   Fall    last sunday, pt states she sled and fell   Presents today due to low back pain on the left side over the past 1 week after falling 8 days ago. 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 Went to put up pans in kitchen, and stepped in slick spot - feet went into air, landed on low back, and back of head hit floor.  Disoriented for a second or two only. No LOC. No subsequent HA.  Able to stand up and continued to work. Worked next day for 10 hrs with some discomfort. Left upper hip/buttcok area progressively more sore 2 days after injury (6 days ago) day to point of dizzy/sweating for pain. No HA.  More limp with pain, hurt with weight bearing, pressure with walking hurts, sitting ok. Not back to work - pain about same past 6 days.  No hematuria. No dark urine. No skin changes/bruising.   Slight improvement in symptoms today.  On letrozole for breast CA - diagnosed in 2019, tx, surgery, chemo and radiation - last radiation tx jan 29th.   Tx: Heat/ice   BP up at doctor's office usually - meds in past made feel worse.   Of note she did have a bone density test performed February 7.  Osteopenia at that time with femoral neck on the right with a T score -2.1, FRAX score of 13% advanced degenerative changes were noted on lumbar spine.  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   Adopted    Port-A-Cath in place 10/22/2017   Cancer of  overlapping sites of right female breast ([place]) 10/08/2017   Malignant neoplasm of left breast in female, estrogen receptor positive ([place]) 10/08/2017   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)   Past Surgical History:  Procedure Laterality Date   BREAST LUMPECTOMY WITH RADIOACTIVE SEED AND SENTINEL LYMPH NODE BIOPSY Left 04/03/2018   Procedure: LEFT BREAST LUMPECTOMY WITH RADIOACTIVE SEED AND LEFT SENTINEL LYMPH NODE BIOPSY;  Surgeon: [name], MD;  Location: [place];  Service: General;  Laterality: Left;   IR IMAGING GUIDED PORT INSERTION  10/20/2017   IR US GUIDE VASC ACCESS LEFT  10/20/2017   MASTECTOMY MODIFIED RADICAL Right 04/03/2018   Procedure: MASTECTOMY MODIFIED RADICAL;  Surgeon: [name], MD;  Location: [place];  Service: General;  Laterality: Right;   PORT-A-CATH REMOVAL N/A 04/03/2018   Procedure: REMOVAL PORT-A-CATH;  Surgeon: [name], MD;  Location: [facility];  Service: General;  Laterality: N/A;   TONSILLECTOMY Bilateral  TUBAL LIGATION     Allergies  Allergen Reactions   Bee Venom    Prior to Admission medications   Medication Sig Start Date End Date Taking? Authorizing Provider  Ascorbic Acid (VITAMIN C) 1000 MG tablet Take 1,000 mg by mouth 3 (three) times daily.   Yes [provider]  Coenzyme Q10 100 MG capsule Take 100 mg by mouth daily.    Yes [provider]  letrozole (FEMARA) 2.5 MG tablet Take 1 tablet (2.5 mg total) by mouth daily. 10/19/18  Yes [name], MD  prochlorperazine (COMPAZINE) 10 MG tablet Take 1 tablet (10 mg total) by mouth every 6 (six)  hours as needed (Nausea or vomiting). 11/19/17  Yes [name], MD  Biotin 1000 MCG CHEW Chew by mouth.    [provider]  docusate sodium (COLACE) 100 MG capsule Take 100 mg by mouth 3 (three) times daily.    [provider]  HYDROcodone-acetaminophen (NORCO) 5-325 MG tablet Take 1-2 tablets by mouth every 6 (six) hours as needed for moderate pain or severe pain. Patient not taking: Reported on 07/07/2018 04/03/18   [name], MD  hydrocortisone 2.5 % cream Apply topically 2 (two) times daily. Patient not taking: Reported on 07/07/2018 05/22/18   [name], MD  lidocaine-prilocaine (EMLA) cream Apply to affected area once Patient not taking: Reported on 12/07/2018 10/09/17   [name], MD  vitamin B-12 (CYANOCOBALAMIN) 1000 MCG tablet Take 1,000 mcg by mouth daily.    [provider]  vitamin E 100 UNIT capsule Take 100 Units by mouth daily.    [provider]   Social History   Socioeconomic History   Marital status: Significant Other    Spouse name: Not on file   Number of children: 1   Years of education: Not on file   Highest education level: Not on file  Occupational History   Not on file  Social Needs   Financial resource strain: Not on file   Food insecurity    Worry: Not on file    Inability: Not on file   Transportation needs    Medical: Not on file    Non-medical: Not on file  Tobacco Use   Smoking status: Current Every Day Smoker    Packs/day: 0.50    Years: 51.00    Pack years: 25.50    Types: Cigarettes   Smokeless tobacco: Never Used   Tobacco comment: Working on quitting.  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 89    Sexual activity: Not Currently    Birth control/protection: Sponge, Surgical  Lifestyle   Physical activity    Days per week: 7 days    Minutes per session: 80 min   Stress: Very much    Relationships   Social connections    Talks on phone: Never    Gets together: Never    Attends religious service: Never    Active member of club or organization: No    Attends meetings of clubs or organizations: Never    Relationship status: Living with partner   Intimate partner violence    Fear of current or ex partner: No    Emotionally abused: No    Physically abused: No    Forced sexual activity: No  Other Topics Concern   Not on file  Social History Narrative  Not on file    Review of Systems Per HPI.     Objective:   Physical Exam Vitals signs reviewed.  HENT:     Head: Normocephalic and atraumatic. No abrasion, contusion, masses or laceration.  Pulmonary:     Effort: Pulmonary effort is normal.  Musculoskeletal:     Left hip: She exhibits normal range of motion (Pain-free range of motion of left hip, negative seated straight leg raise bilaterally, equal lower extremity strength bilaterally.), normal strength, no tenderness and no bony tenderness.     Left knee: No tenderness found.     Lumbar back: She exhibits tenderness and bony tenderness. She exhibits no swelling, no edema, no deformity and no spasm.       Back:  Skin:    General: Skin is warm and dry.     Findings: No bruising or erythema.  Neurological:     Mental Status: She is alert.    Vitals:   12/07/18 1334 12/07/18 1402  BP: (!) 168/93 (!) 152/90  Pulse: 100   Resp: 16   Temp: 98.7 F (37.1 C)   TempSrc: Oral   SpO2: 96%   Weight: 183 lb (83 kg)   Height: 5\' 4"  (1.626 m)    Dg Lumbar Spine 2-3 Views  Result Date: 12/07/2018 CLINICAL DATA:  Low back pain after fall last week. EXAM: LUMBAR SPINE - 2-3 VIEW COMPARISON:  CT scan of Oct 17, 2017. FINDINGS: Minimal grade 1 anterolisthesis of L3-4 is noted secondary to posterior facet joint hypertrophy. Mild degenerative disc disease is noted at L1-2 and L2-3 and L3-4 with anterior osteophyte formation. IMPRESSION: Mild multilevel  degenerative disc disease. No acute abnormality seen in the lumbar spine. Electronically Signed   By: Marijo Conception M.D.   On: 12/07/2018 15:11   Dg Pelvis 1-2 Views  Result Date: 12/07/2018 CLINICAL DATA:  Pelvic pain after fall. EXAM: PELVIS - 1-2 VIEW COMPARISON:  CT scan of Oct 17, 2017. FINDINGS: There is no evidence of pelvic fracture or diastasis. 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 IMPRESSION: No acute abnormality seen in the pelvis. Electronically Signed   By: Marijo Conception M.D.   On: 12/07/2018 15:13        Assessment & Plan:    Myya Meenach is a 63 y.o. female Fall, initial encounter - Plan: DG Lumbar Spine 2-3 Views, DG Pelvis 1-2 Views, meloxicam (MOBIC) 7.5 MG tablet Contusion of pelvic region, initial encounter - Plan: DG Lumbar Spine 2-3 Views, DG Pelvis 1-2 Views, meloxicam (MOBIC) 7.5 MG tablet Sacroiliac pain - Plan: DG Lumbar Spine 2-3 Views, DG Pelvis 1-2 Views, meloxicam (MOBIC) 7.5 MG tablet  -Fall approximately 8 days ago as above.  No initial debility, was able to continue work and weightbearing.  Onset of discomfort over the next 24 to 48 hours.  Imaging without sign of fracture.  Suspected lumbar strain possible SI joint inflammation/strain, and buttock contusion.  -Okay to remain out of work remainder of week, symptomatic care with heat or ice to affected area, short-term trial of meloxicam with potential side effects and risk discussed.  Monitor blood pressure closely on this medication.  -Recheck in next 1 week if not improving, sooner if worse  Elevated blood pressure reading  -May be a pain component.  Monitor at home, and if running over 140/90, advised to follow-up to discuss medication.  RTC/ER precautions.  Meds ordered this encounter  Medications   meloxicam (MOBIC) 7.5 MG tablet  Sig: Take 1 tablet (7.5 mg total) by mouth daily.    Dispense:  15 tablet    Refill:  0   Patient Instructions    I did order some x-rays  of the low back and pelvis/SI joint area.  I suspect a contusion to that area at this time.  Okay to apply heat or ice to affected area as needed, avoid prolonged walking or standing for now until symptoms have improved.  Meloxicam up to once per day as needed, but do not combine with Advil, Aleve or other NSAIDs or aspirin.  You can call your oncologist to make sure they are okay with you taking that medication.  Would still rest this week, then potentially return to work next Monday if symptoms have improved or resolved.  If not improving into next week or worsen sooner, call and schedule follow-up appointment.  Return to the clinic or go to the nearest emergency room if any of your symptoms worsen or new symptoms occur.   Keep a record of your blood pressures outside of the office and if over 140/90 - schedule appointment to discuss meds.    Acute Back Pain, Adult Acute back pain is sudden and usually short-lived. It is often caused by an injury to the muscles and tissues in the back. The injury may result from:  A muscle or ligament getting overstretched or torn (strained). Ligaments are tissues that connect bones to each other. Lifting something improperly can cause a back strain.  Wear and tear (degeneration) of the spinal disks. Spinal disks are circular tissue that provides cushioning between the bones of the spine (vertebrae).  Twisting motions, such as while playing sports or doing yard work.  A hit to the back.  Arthritis. You may have a physical exam, lab tests, and imaging tests to find the cause of your pain. Acute back pain usually goes away with rest and home care. Follow these instructions at home: Managing pain, stiffness, and swelling  Take over-the-counter and prescription medicines only as told by your health care provider.  Your health care provider may recommend applying ice during the first 24-48 hours after your pain starts. To do this: ? Put ice in a plastic  bag. ? Place a towel between your skin and the bag. ? Leave the ice on for 20 minutes, 2-3 times a day.  If directed, apply heat to the affected area as often as told by your health care provider. Use the heat source that your health care provider recommends, such as a moist heat pack or a heating pad. ? Place a towel between your skin and the heat source. ? Leave the heat on for 20-30 minutes. ? Remove the heat if your skin turns bright red. This is especially important if you are unable to feel pain, heat, or cold. You have a greater risk of getting burned. Activity   Do not stay in bed. Staying in bed for more than 1-2 days can delay your recovery.  Sit up and stand up straight. Avoid leaning forward when you sit, or hunching over when you stand. ? If you work at a desk, sit close to it so you do not need to lean over. Keep your chin tucked in. Keep your neck drawn back, and keep your elbows bent at a right angle. Your arms should look like the letter "L." ? Sit high and close to the steering wheel when you drive. Add lower back (lumbar) support to your car seat,  if needed.  Take short walks on even surfaces as soon as you are able. Try to increase the length of time you walk each day.  Do not sit, drive, or stand in one place for more than 30 minutes at a time. Sitting or standing for long periods of time can put stress on your back.  Do not drive or use heavy machinery while taking prescription pain medicine.  Use proper lifting techniques. When you bend and lift, use positions that put less stress on your back: ? [place] your knees. ? Keep the load close to your body. ? Avoid twisting.  Exercise regularly as told by your health care provider. Exercising helps your back heal faster and helps prevent back injuries by keeping muscles strong and flexible.  Work with a physical therapist to make a safe exercise program, as recommended by your health care provider. Do any exercises as told  by your physical therapist. Lifestyle  Maintain a healthy weight. Extra weight puts stress on your back and makes it difficult to have good posture.  Avoid activities or situations that make you feel anxious or stressed. Stress and anxiety increase muscle tension and can make back pain worse. Learn ways to manage anxiety and stress, such as through exercise. General instructions  Sleep on a firm mattress in a comfortable position. Try lying on your side with your knees slightly bent. If you lie on your back, put a pillow under your knees.  Follow your treatment plan as told by your health care provider. This may include: ? Cognitive or behavioral therapy. ? Acupuncture or massage therapy. ? Meditation or yoga. Contact a health care provider if:  You have pain that is not relieved with rest or medicine.  You have increasing pain going down into your legs or buttocks.  Your pain does not improve after 2 weeks.  You have pain at night.  You lose weight without trying.  You have a fever or chills. Get help right away if:  You develop new bowel or bladder control problems.  You have unusual weakness or numbness in your arms or legs.  You develop nausea or vomiting.  You develop abdominal pain.  You feel faint. Summary  Acute back pain is sudden and usually short-lived.  Use proper lifting techniques. When you bend and lift, use positions that put less stress on your back.  Take over-the-counter and prescription medicines and apply heat or ice as directed by your health care provider. This information is not intended to replace advice given to you by your health care provider. Make sure you discuss any questions you have with your health care provider. Document Released: 05/27/2005 Document Revised: 09/15/2018 Document Reviewed: 01/08/2017 Elsevier Patient Education  [place].  Contusion A contusion is a deep bruise. Contusions are the result of a blunt injury to  tissues and muscle fibers under the skin. The injury causes bleeding under the skin. The skin overlying the contusion may turn blue, purple, or yellow. Minor injuries will give you a painless contusion, but more severe injuries cause contusions that may stay painful and swollen for a few weeks. Follow these instructions at home: Pay attention to any changes in your symptoms. Let your health care provider know about them. Take these actions to relieve your pain. Managing pain, stiffness, and swelling   Use resting, icing, applying pressure (compression), and raising (elevating) the injured area. This is often called the RICE strategy. ? Rest the injured area. Return to your  normal activities as told by your health care provider. Ask your health care provider what activities are safe for you. ? If directed, put ice on the injured area:  Put ice in a plastic bag.  Place a towel between your skin and the bag.  Leave the ice on for 20 minutes, 2-3 times per day. ? If directed, apply light compression to the injured area using an elastic bandage. Make sure the bandage is not wrapped too tightly. Remove and reapply the bandage as directed by your health care provider. ? If possible, raise (elevate) the injured area above the level of your heart while you are sitting or lying down. General instructions  Take over-the-counter and prescription medicines only as told by your health care provider.  Keep all follow-up visits as told by your health care provider. This is important. Contact a health care provider if:  Your symptoms do not improve after several days of treatment.  Your symptoms get worse.  You have difficulty moving the injured area. Get help right away if:  You have severe pain.  You have numbness in a hand or foot.  Your hand or foot turns pale or cold. Summary  A contusion is a deep bruise.  Contusions are the result of a blunt injury to tissues and muscle fibers under the  skin.  It is treated with rest, ice, compression, and elevation. You may be given over-the-counter medicines for pain.  Contact a health care provider if your symptoms do not improve, or get worse.  Get help right away if you have severe pain, have numbness, or the area turns pale or cold. This information is not intended to replace advice given to you by your health care provider. Make sure you discuss any questions you have with your health care provider. Document Released: 03/06/2005 Document Revised: 01/15/2018 Document Reviewed: 01/15/2018 Elsevier Patient Education  [employer].      If you have lab work done today you will be contacted with your lab results within the next 2 weeks.  If you have not heard from [place] then please contact us. The fastest way to get your results is to register for My Chart.   IF you received an x-ray today, you will receive an invoice from [facility]. Please contact [facility] at [phone] with questions or concerns regarding your invoice.   IF you received labwork today, you will receive an invoice from [facility]. Please contact [facility] at [phone] with questions or concerns regarding your invoice.   Our billing staff will not be able to assist you with questions regarding bills from these companies.  You will be contacted with the lab results as soon as they are available. The fastest way to get your results is to activate your My Chart account. Instructions are located on the last page of this paperwork. If you have not heard from [place] regarding the results in 2 weeks, please contact this office.       Signed,   [name], MD [facility].  12/08/18 11:05 AM

## 2018-12-07 NOTE — Patient Instructions (Addendum)
I did order some x-rays of the low back and pelvis/SI joint area.  I suspect a contusion to that area at this time.  Okay to apply heat or ice to affected area as needed, avoid prolonged walking or standing for now until symptoms have improved.  Meloxicam up to once per day as needed, but do not combine with Advil, Aleve or other NSAIDs or aspirin.  You can call your oncologist to make sure they are okay with you taking that medication.  Would still rest this week, then potentially return to work next Monday if symptoms have improved or resolved.  If not improving into next week or worsen sooner, call and schedule follow-up appointment.  Return to the clinic or go to the nearest emergency room if any of your symptoms worsen or new symptoms occur.   Keep a record of your blood pressures outside of the office and if over 140/90 - schedule appointment to discuss meds.    Acute Back Pain, Adult Acute back pain is sudden and usually short-lived. It is often caused by an injury to the muscles and tissues in the back. The injury may result from:  A muscle or ligament getting overstretched or torn (strained). Ligaments are tissues that connect bones to each other. Lifting something improperly can cause a back strain.  Wear and tear (degeneration) of the spinal disks. Spinal disks are circular tissue that provides cushioning between the bones of the spine (vertebrae).  Twisting motions, such as while playing sports or doing yard work.  A hit to the back.  Arthritis. You may have a physical exam, lab tests, and imaging tests to find the cause of your pain. Acute back pain usually goes away with rest and home care. Follow these instructions at home: Managing pain, stiffness, and swelling  Take over-the-counter and prescription medicines only as told by your health care provider.  Your health care provider may recommend applying ice during the first 24-48 hours after your pain starts. To do  this: ? Put ice in a plastic bag. ? Place a towel between your skin and the bag. ? Leave the ice on for 20 minutes, 2-3 times a day.  If directed, apply heat to the affected area as often as told by your health care provider. Use the heat source that your health care provider recommends, such as a moist heat pack or a heating pad. ? Place a towel between your skin and the heat source. ? Leave the heat on for 20-30 minutes. ? Remove the heat if your skin turns bright red. This is especially important if you are unable to feel pain, heat, or cold. You have a greater risk of getting burned. Activity   Do not stay in bed. Staying in bed for more than 1-2 days can delay your recovery.  Sit up and stand up straight. Avoid leaning forward when you sit, or hunching over when you stand. ? If you work at a desk, sit close to it so you do not need to lean over. Keep your chin tucked in. Keep your neck drawn back, and keep your elbows bent at a right angle. Your arms should look like the letter "L." ? Sit high and close to the steering wheel when you drive. Add lower back (lumbar) support to your car seat, if needed.  Take short walks on even surfaces as soon as you are able. Try to increase the length of time you walk each day.  Do not sit, drive,  or stand in one place for more than 30 minutes at a time. Sitting or standing for long periods of time can put stress on your back.  Do not drive or use heavy machinery while taking prescription pain medicine.  Use proper lifting techniques. When you bend and lift, use positions that put less stress on your back: ? [place] your knees. ? Keep the load close to your body. ? Avoid twisting.  Exercise regularly as told by your health care provider. Exercising helps your back heal faster and helps prevent back injuries by keeping muscles strong and flexible.  Work with a physical therapist to make a safe exercise program, as recommended by your health care  provider. Do any exercises as told by your physical therapist. Lifestyle  Maintain a healthy weight. Extra weight puts stress on your back and makes it difficult to have good posture.  Avoid activities or situations that make you feel anxious or stressed. Stress and anxiety increase muscle tension and can make back pain worse. Learn ways to manage anxiety and stress, such as through exercise. General instructions  Sleep on a firm mattress in a comfortable position. Try lying on your side with your knees slightly bent. If you lie on your back, put a pillow under your knees.  Follow your treatment plan as told by your health care provider. This may include: ? Cognitive or behavioral therapy. ? Acupuncture or massage therapy. ? Meditation or yoga. Contact a health care provider if:  You have pain that is not relieved with rest or medicine.  You have increasing pain going down into your legs or buttocks.  Your pain does not improve after 2 weeks.  You have pain at night.  You lose weight without trying.  You have a fever or chills. Get help right away if:  You develop new bowel or bladder control problems.  You have unusual weakness or numbness in your arms or legs.  You develop nausea or vomiting.  You develop abdominal pain.  You feel faint. Summary  Acute back pain is sudden and usually short-lived.  Use proper lifting techniques. When you bend and lift, use positions that put less stress on your back.  Take over-the-counter and prescription medicines and apply heat or ice as directed by your health care provider. This information is not intended to replace advice given to you by your health care provider. Make sure you discuss any questions you have with your health care provider. Document Released: 05/27/2005 Document Revised: 09/15/2018 Document Reviewed: 01/08/2017 Elsevier Patient Education  [place].  Contusion A contusion is a deep bruise. Contusions  are the result of a blunt injury to tissues and muscle fibers under the skin. The injury causes bleeding under the skin. The skin overlying the contusion may turn blue, purple, or yellow. Minor injuries will give you a painless contusion, but more severe injuries cause contusions that may stay painful and swollen for a few weeks. Follow these instructions at home: Pay attention to any changes in your symptoms. Let your health care provider know about them. Take these actions to relieve your pain. Managing pain, stiffness, and swelling   Use resting, icing, applying pressure (compression), and raising (elevating) the injured area. This is often called the RICE strategy. ? Rest the injured area. Return to your normal activities as told by your health care provider. Ask your health care provider what activities are safe for you. ? If directed, put ice on the injured area:  Put  ice in a plastic bag.  Place a towel between your skin and the bag.  Leave the ice on for 20 minutes, 2-3 times per day. ? If directed, apply light compression to the injured area using an elastic bandage. Make sure the bandage is not wrapped too tightly. Remove and reapply the bandage as directed by your health care provider. ? If possible, raise (elevate) the injured area above the level of your heart while you are sitting or lying down. General instructions  Take over-the-counter and prescription medicines only as told by your health care provider.  Keep all follow-up visits as told by your health care provider. This is important. Contact a health care provider if:  Your symptoms do not improve after several days of treatment.  Your symptoms get worse.  You have difficulty moving the injured area. Get help right away if:  You have severe pain.  You have numbness in a hand or foot.  Your hand or foot turns pale or cold. Summary  A contusion is a deep bruise.  Contusions are the result of a blunt injury to  tissues and muscle fibers under the skin.  It is treated with rest, ice, compression, and elevation. You may be given over-the-counter medicines for pain.  Contact a health care provider if your symptoms do not improve, or get worse.  Get help right away if you have severe pain, have numbness, or the area turns pale or cold. This information is not intended to replace advice given to you by your health care provider. Make sure you discuss any questions you have with your health care provider. Document Released: 03/06/2005 Document Revised: 01/15/2018 Document Reviewed: 01/15/2018 Elsevier Patient Education  [employer].      If you have lab work done today you will be contacted with your lab results within the next 2 weeks.  If you have not heard from [place] then please contact us. The fastest way to get your results is to register for My Chart.   IF you received an x-ray today, you will receive an invoice from [facility]. Please contact [facility] at [phone] with questions or concerns regarding your invoice.   IF you received labwork today, you will receive an invoice from [place]. Please contact [facility] at [phone] with questions or concerns regarding your invoice.   Our billing staff will not be able to assist you with questions regarding bills from these companies.  You will be contacted with the lab results as soon as they are available. The fastest way to get your results is to activate your My Chart account. Instructions are located on the last page of this paperwork. If you have not heard from [place] regarding the results in 2 weeks, please contact this office.

## 2018-12-08 ENCOUNTER — Encounter: Payer: Self-pay | Admitting: Family Medicine

## 2018-12-15 NOTE — Progress Notes (Signed)
CLINIC:  Survivorship   REASON FOR VISIT:  Routine follow-up post-treatment for a recent history of breast cancer.  Patient Care Team: [name], MD as PCP - General (Family Medicine) [name], MD as Consulting Physician (General Surgery) [name], MD as Consulting Physician (Hematology) Kyung Rudd, MD as Consulting Physician (Radiation Oncology) Alla Feeling, NP as Nurse Practitioner (Nurse Practitioner)  BRIEF ONCOLOGIC HISTORY:  Oncology History Overview Note  Cancer Staging Cancer of overlapping sites of right female breast [facility]) Staging form: Breast, AJCC 8th Edition - Clinical stage from 09/23/2017: Stage IIIA (cT3, cN1, cM0, G2, ER+, PR-, HER2-) - Signed by [name], MD on 10/09/2017 - Pathologic stage from 04/03/2018: No Stage Recommended (ypT3, pN2a, cM0, G3, ER+, PR-, HER2-) - Signed by [name], MD on 05/03/2018  Malignant neoplasm of left breast in female, estrogen receptor positive ([place]) Staging form: Breast, AJCC 8th Edition - Clinical: Stage IA (cT1b, cN0, cM0, G1, ER+, PR+, HER2-) - Unsigned - Pathologic stage from 04/03/2018: No Stage Recommended (ypT1b, pN0, cM0, G1, ER+, PR+, HER2-) - Signed by [name], MD on 05/03/2018     Cancer of overlapping sites of right female breast ([place])  09/09/2017 Mammogram   IMPRESSION: 1. Highly suspicious large right breast mass involving the outer and upper breast extending from approximately 9 o'clock through 12-1 o'clock, maximum measurement approximating 7.5 cm. The mass involves the right nipple, accounting for nipple retraction. Architectural distortion, microcalcifications and diffuse trabecular thickening and skin thickening are associated with the large mass, raising the possibility of this representing an inflammatory cancer. 2. Satellite masses separate from the dominant mass involving the lower outer quadrant and the upper inner quadrant of the right breast, measured above. 3. Two adjacent  pathologic right axillary lymph nodes. 4. Indeterminate 0.9 cm solid mass involving the upper outer quadrant of the left breast which accounts for a mammographic finding. 5. No pathologic left axillary lymphadenopathy.   09/09/2017 Breast US   Targeted right breast ultrasound is performed, showing a very large hypoechoic mass with irregular margins extending from the approximate 9 o'clock position through the 12 to 1 o'clock position. On the CC gait image, the mass measures maximally approximately 7.5 cm. The mass has irregular margins, demonstrates acoustic shadowing, and demonstrates internal power Doppler flow. The mass extends into the retroareolar region and involves the nipple, accounting for the nipple retraction.  Separate from the dominant mass at the 7 o'clock position approximately 4 cm from nipple is a hypoechoic antiparallel mass with irregular margins measuring approximately 1.4 x 2.7 x 1.7 cm.  Separate from the dominant mass at the 1 o'clock position approximately 2 cm from nipple is a hypoechoic mass with irregular margins measuring approximately 1.7 x 0.5 x 1.6 cm. Both of these masses also demonstrate internal power Doppler flow.  Sonographic evaluation of the right axilla demonstrates 2 adjacent pathologic lymph nodes, the larger measuring approximately 2.5 x 2.5 x 2.8 cm, the smaller measuring approximately 2.0 x 0.8 x 2.7 cm.   09/23/2017 Initial Biopsy   Diagnosis 1. Breast, left, needle core biopsy, 2 o'clock - INVASIVE DUCTAL CARCINOMA. - SEE COMMENT. 2. Breast, right, needle core biopsy, 11:30 o'clock - INVASIVE MAMMARY CARCINOMA. - SEE COMMENT. 3. Lymph node, needle/core biopsy, right axilla - METASTATIC MAMMARY CARCINOMA IN 1 OF 1 LYMPH NODE (1/1). Microscopic Comment 1. There is a small focus of grade I invasive ductal carcinoma. A complete breast prognostic profile will be attempted and the results reported separately. The  results were called to the [place] on 09/24/2017. 2. The carcinoma appears grade II. An E-Cadherin stain and a breast prognostic profile will be performed on part 2 and the results reported separately. (JBK:kh 09-24-17) [name]  1. HER2 Negative by FISH Estrogen receptor: 100% positive, strong staining Progesterone receptor: 80% positive, strong staining Ki67: 5%  2. HER2 Negative by FISH Estrogen receptor: 100% positive, strong staining Progesterone receptor: 0%, negative  Ki67: 30% 2. The tumor cells are strongly positive for E-cadherin, supporting a ductal phenotype   09/23/2017 Cancer Staging   Staging form: Breast, AJCC 8th Edition - Clinical stage from 09/23/2017: Stage IIIA (cT3, cN1, cM0, G2, ER+, PR-, HER2-) - Signed by [name], MD on 10/09/2017   10/07/2017 Breast MRI   IMPRESSION: 1. Biopsy proven malignancy involving all 4 quadrants of the right breast, although predominantly located in the anterior third of the central upper right breast measures 6.8 x 9.6 x 7.2 cm.  2. Five pathologic right axillary lymph nodes, compatible with biopsy proven axillary metastases.  3. Small mass with associated biopsy related changes in the upper-outer left breast at site of known malignancy measures 0.9 x 0.8 x 0.7 cm. No abnormal lymph nodes seen in the left axilla.  RECOMMENDATION: Treatment plan for known bilateral breast malignancy.  BI-RADS CATEGORY  6: Known biopsy-proven malignancy.   10/08/2017 Initial Diagnosis   Cancer of overlapping sites of right female breast ([place])   10/13/2017 Pathology Results   Diagnosis 1. Breast, right, needle core biopsy, 1 o'clock - INVASIVE DUCTAL CARCINOMA. - SEE COMMENT. 2. Breast, right, needle core biopsy, 7 o'clock - INVASIVE DUCTAL CARCINOMA. - SEE COMMENT. Microscopic Comment 1. and 2. The carcinoma in parts 1 and 2 is morphologically similar and appears grade II. Because breast prognostic profiles were performed on the patients previous case,  [id number], it will not be repeated on the current case unless requested.    10/17/2017 Imaging   CT CAP IMPRESSION: 1. Infiltrative right breast mass with overlying skin thickening is identified compatible with known breast cancer. 2. Enlarged right axillary lymph nodes compatible with metastatic adenopathy. 3. No additional sites of disease identified within the chest and no evidence for metastasis to the abdomen or pelvis. 4. Aortic atherosclerosis and 3 vessel coronary artery atherosclerotic calcifications. Aortic Atherosclerosis (ICD10-I70.0).   10/17/2017 Imaging   Bone Scan FINDINGS: There are no foci of increased or decreased radiotracer uptake to suggest osseous metastatic disease. 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 There is degenerative type uptake within both shoulders and the left ankle.  Normal physiologic activity is identified within the kidneys and urinary bladder.  IMPRESSION: No evidence of osseous metastatic disease.   10/22/2017 -  Chemotherapy   AC q2 weeks x4 cycles starting 10/22/17-12/03/17, followed by weekly taxol x12 12/17/17-03/11/18   11/14/2017 Genetic Testing   The genes that were analyzed were the 83 genes on [facility]'s Multi-Cancer panel (ALK, APC, ATM, AXIN2, BAP1, BARD1, BLM, BMPR1A, BRCA1, BRCA2, BRIP1, CASR, CDC73, CDH1, CDK4, CDKN1B, CDKN1C, CDKN2A, CEBPA, CHEK2, CTNNA1, DICER1, DIS3L2, EGFR, EPCAM, FH, FLCN, GATA2, GPC3, GREM1, HOXB13, HRAS, KIT, MAX, MEN1, MET, MITF, MLH1, MSH2, MSH3, MSH6, MUTYH, NBN, NF1, NF2, NTHL1, PALB2, PDGFRA, PHOX2B, PMS2, POLD1, POLE, POT1, PRKAR1A, PTCH1, PTEN, RAD50, RAD51C, RAD51D, RB1, RECQL4, RET, RUNX1, SDHA, SDHAF2, SDHB, SDHC, SDHD, SMAD4, SMARCA4, SMARCB1, SMARCE1, STK11, SUFU, TERC, TERT, TMEM127, TP53, TSC1, TSC2, VHL, WRN,  WT1).  Testingrevealed a pathogenic mutation in the MITF genecalled c.952G>A (p.Glu318Lys)  and A Variant of Unknown Significance (VUS) was also detected: POLD1 c.301A>T (p.Ile101Phe).   12/16/2017 Breast US   IMPRESSION: 1. Stable to minimal decrease in size of multiple irregular masses throughout the right breast consistent with the patient's sites of biopsy proven malignancy. While the mammographic appearance is slightly less dense in this region, there is increased skin and trabecular thickening involving the entire right breast. 2. Stable appearance of morphologically abnormal right axillary lymph nodes, consistent with biopsy proven metastatic disease. 3. Stable appearance of a left breast mass, consistent with biopsy proven malignancy.    04/03/2018 Cancer Staging   Staging form: Breast, AJCC 8th Edition - Pathologic stage from 04/03/2018: No Stage Recommended (ypT3, pN2a, cM0, G3, ER+, PR-, HER2-) - Signed by [name], MD on 05/03/2018   10/19/2018 -  Anti-estrogen oral therapy   Adjuvant letrozole 2.'5mg'$  daily     Survivorship   Per Cira Rue, NP    Malignant neoplasm of left breast in female, estrogen receptor positive ([place])  09/09/2017 Breast US   Targeted left breast ultrasound is performed, showing an oval parallel hypoechoic mass containing microcalcifications at the 2 o'clock position approximately 6 cm from nipple measuring approximately 0.9 x 0.5 x 0.9 cm, corresponding to the mammographic finding.  Sonographic evaluation of the left axilla demonstrates no pathologic lymphadenopathy.   09/23/2017 Initial Biopsy   Diagnosis 1. Breast, left, needle core biopsy, 2 o'clock - INVASIVE DUCTAL CARCINOMA. - SEE COMMENT. 2. Breast, right, needle core biopsy, 11:30 o'clock - INVASIVE MAMMARY CARCINOMA. - SEE COMMENT. 3. Lymph node, needle/core biopsy, right axilla - METASTATIC MAMMARY CARCINOMA IN 1 OF 1 LYMPH NODE (1/1). Microscopic Comment 1. There is a small focus of grade I invasive ductal carcinoma. A complete breast prognostic profile will be attempted and  the results reported separately. The results were called to the [place] on 09/24/2017. 2. The carcinoma appears grade II. An E-Cadherin stain and a breast prognostic profile will be performed on part 2 and the results reported separately. (JBK:kh 09-24-17) [name]  1. HER2 Negative by FISH Estrogen receptor: 100% positive, strong staining Progesterone receptor: 80% positive, strong staining Ki67: 5%  2. HER2 Negative by FISH Estrogen receptor: 100% positive, strong staining Progesterone receptor: 0%, negative  Ki67: 30%   10/08/2017 Initial Diagnosis   Malignant neoplasm of left breast in female, estrogen receptor positive ([place])   12/16/2017 Breast US   IMPRESSION: 3. Stable appearance of a left breast mass, consistent with biopsy proven malignancy.    01/22/2018 Imaging   01/22/2018 MRI Bilateral Breast IMPRESSION: 1. Dominant enhancing mass with surrounding nodularity involving the central anterior third of the right breast now measures up to 6.4 x 6.7 x 7.6 cm (previously measured 6.8 x 9.6 x 7.2 cm). Persistent bulky right axillary lymphadenopathy, slightly decreased in size since prior MRI.  2. Biopsy proven left breast malignancy now measures 0.5 cm (previously measured 0.9 x 0.8 x 0.7 cm).    04/03/2018 Pathology Results   Diagnosis 1. Breast, lumpectomy, left with radioactive seed - RESIDUAL INVASIVE DUCTAL CARCINOMA STATUS POST NEOADJUVANT THERAPY (0.9 CM) - MARGINS UNINVOLVED BY CARCINOMA (0.2 CM; INFERIOR MARGIN) - CALCIFICATIONS ASSOCIATED WITH CARCINOMA - PREVIOUS BIOPSY SITE CHANGES PRESENT - SEE ONCOLOGY TABLE AND COMMENT BELOW 2. Lymph node, sentinel, biopsy, left axillary #1 - NO CARCINOMA IDENTIFIED IN ONE LYMPH NODE (0/1) - SEE COMMENT 3. Lymph node, sentinel, biopsy, left  axillary #2 - NO CARCINOMA IDENTIFIED IN ONE LYMPH NODE (0/1) - SEE COMMENT 4. Lymph node, sentinel, biopsy, left - NO CARCINOMA IDENTIFIED IN ONE LYMPH NODE  (0/1) - SEE COMMENT 5. Lymph node, sentinel, biopsy, left axillary #3 - NO CARCINOMA IDENTIFIED IN ONE LYMPH NODE (0/1) - SEE COMMENT 6. Breast, modified radical mastectomy , right - RESIDUAL INVASIVE DUCTAL CARCINOMA STATUS POST NEOADJUVANT THERAPY (8.5 CM) - EXTENSIVE LYMPHOVASCULAR SPACE INVASION PRESENT - METASTATIC CARCINOMA INVOLVING FIVE OF FIFTEEN LYMPH NODES WITH EXTRACAPSULAR EXTENSION (5/15) - MARGINS UNINVOLVED BY CARCINOMA - SEE ONCOLOGY TABLE AND COMMENT BELOW   04/03/2018 Receptors her2   The tumor cells are NEGATIVE for Her2 (1+). Estrogen Receptor: 95%, POSITIVE, STRONG STAINING INTENSITY Progesterone Receptor: 95%, POSITIVE, STRONG STAINING INTENSITY  The tumor cells are NEGATIVE for Her2 (1+). Estrogen Receptor: 100%, POSITIVE, STRONG STAINING INTENSITY Progesterone Receptor: 0%, NEGATIVE   04/03/2018 Cancer Staging   Staging form: Breast, AJCC 8th Edition - Pathologic stage from 04/03/2018: No Stage Recommended (ypT1b, pN0, cM0, G1, ER+, PR+, HER2-) - Signed by [name], MD on 05/03/2018   04/03/2018 Surgery   Left LEFT BREAST LUMPECTOMY WITH RADIOACTIVE SEED AND LEFT SENTINEL LYMPH NODE BIOPSY with [name], MD    05/20/2018 - 07/08/2018 Radiation Therapy   Daily adjuvant radiation treatment with Dr.Moody.    Radiation treatment dates:   05/20/2018 - 07/08/2018  Site/dose:   The patient initially received a dose of 50.4 Gy in 28 fractions to the right chest wall and supraclavicular region. This was delivered using a 3-D conformal, 4 field technique. The patient then received a boost to the mastectomy scar. This delivered an additional 10 Gy in 5 fractions using an en face electron field. The total dose was 60.4 Gy. At the same time, the patient received a dose of 50.4 Gy in 28 fractions to the left breast using whole-breast tangent fields. This was delivered using a 3-D conformal technique. The patient then received a boost to the seroma. This  delivered an additional 10 Gy in 5 fractions using 15E electrons with a special teletherapy technique. The total dose was 60.4 Gy.   10/19/2018 -  Anti-estrogen oral therapy   Letrozole 2.'5mg'$  daily started 07/2018     Survivorship   Per Cira Rue, NP      INTERVAL HISTORY:  Darlene Beasley presents to the [facility] today for our initial meeting to review her survivorship care plan detailing her treatment course for breast cancer, as well as monitoring long-term side effects of that treatment, education regarding health maintenance, screening, and overall wellness and health promotion.     Overall, Darlene Beasley is feeling "back to normal." She completed adjuvant radiation therapy approximately 6 months ago.  She began anastrozole in 10/2018. She gained 20 lbs over the pandemic quarantine. She is working again at [employer] that closed briefly. She fell at work few months ago and sustained left hip pain. She slipped, was not dizzy. Otherwise she has minor joint aches in hands, knees, shoulders that are slightly worse on letrozole but tolerable. Hot flashes are tolerable, mild. 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 She functions normally, wears gloves at work. She has some discoloration in her left breast and right chest but otherwise denies new lump, nipple discharge or inversion. She has mammogram due tomorrow. She denies fatigue, fever, chills, cough, chest pain, dyspnea, leg edema, GI or vaginal bleeding, n/v/c/d, abd pain.    ONCOLOGY TREATMENT TEAM:  1.  Surgeon:  Dr. Dalbert Batman at [facility] 2. Medical Oncologist: [name]  3. Radiation Oncologist: Dr. 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)   Past Surgical History:  Procedure Laterality Date   BREAST LUMPECTOMY WITH RADIOACTIVE SEED AND SENTINEL LYMPH NODE BIOPSY Left 04/03/2018   Procedure: LEFT BREAST LUMPECTOMY WITH RADIOACTIVE SEED AND LEFT SENTINEL LYMPH NODE BIOPSY;  Surgeon: [name], MD;  Location: [facility];  Service: General;  Laterality: Left;   IR IMAGING GUIDED PORT INSERTION  10/20/2017   IR US GUIDE VASC ACCESS LEFT  10/20/2017   MASTECTOMY MODIFIED RADICAL Right 04/03/2018   Procedure: MASTECTOMY MODIFIED RADICAL;  Surgeon: [name], MD;  Location: [facility];  Service: General;  Laterality: Right;   PORT-A-CATH REMOVAL N/A 04/03/2018   Procedure: REMOVAL PORT-A-CATH;  Surgeon: [name], MD;  Location: [facility];  Service: General;  Laterality: N/A;   TONSILLECTOMY Bilateral    TUBAL LIGATION       ALLERGIES:  Allergies  Allergen Reactions   Bee Venom      CURRENT MEDICATIONS:  Outpatient Encounter Medications as of 12/22/2018  Medication Sig Note   Ascorbic Acid (VITAMIN C) 1000 MG tablet Take 1,000 mg by mouth 3 (three) times daily.    Coenzyme Q10 100 MG capsule Take 100 mg by mouth daily.     letrozole (FEMARA) 2.5 MG tablet Take 1 tablet (2.5 mg total) by mouth daily.    Biotin 1000 MCG CHEW Chew by mouth.    docusate sodium (COLACE) 100 MG capsule Take 100 mg by mouth 3 (three) times daily.    HYDROcodone-acetaminophen (NORCO) 5-325 MG tablet Take 1-2 tablets by mouth every 6 (six) hours as needed for moderate pain or severe pain. (Patient not taking: Reported on 07/07/2018)    hydrocortisone 2.5 % cream Apply topically 2 (two) times daily. (Patient not taking: Reported on 07/07/2018)    lidocaine-prilocaine (EMLA) cream Apply to affected area once (Patient not taking: Reported on 12/07/2018) 10/20/2017: Will begin with  chemotherapy   meloxicam (MOBIC) 7.5 MG tablet Take 1 tablet (7.5 mg total) by mouth daily.    prochlorperazine (COMPAZINE) 10 MG tablet Take 1 tablet (10 mg total) by mouth every 6 (six) hours as needed (Nausea or vomiting). (Patient not taking: Reported on 12/22/2018)    vitamin B-12 (CYANOCOBALAMIN) 1000 MCG tablet Take 1,000 mcg by mouth daily.    vitamin E 100 UNIT capsule Take 100 Units by mouth daily.    No facility-administered encounter medications on file as of 12/22/2018.      ONCOLOGIC FAMILY HISTORY:  Family History  Adopted: Yes  Problem Relation Age of Onset   Congestive Heart Failure Mother    Congestive Heart Failure Father      GENETIC COUNSELING/TESTING: Testingrevealed a pathogenic mutation in the MITF genecalled c.952G>A (p.Glu318Lys) and A Variant of Unknown Significance (VUS) was also detected:POLD1 c.301A>T (p.Ile101Phe).  SOCIAL HISTORY:  Darlene Beasley lives with her partner in [place], [place].  She does not have children.  Ms. Bhargava works full-time at [employer].  She has cut  back to smoking 1/2 pack/day.  Alcohol intake consists of taking 1 shot almost daily.   PHYSICAL EXAMINATION:  Vital Signs:   Vitals:   12/22/18 0940 12/22/18 0949  BP: (!) 165/95 (!) 156/96  Pulse: 74   Resp: 18   Temp: 98.5 F (36.9 C)    Filed Weights   12/22/18 0940  Weight: 185 lb 8 oz (84.1 kg)   General: Well-nourished, well-appearing female in no acute distress.   HEENT:   Sclerae anicteric.  Lymph: No palpable cervical, supraclavicular, or axillary lymphadenopathy   Respiratory: Respirations even and nonlabored GI: Abdomen round GU: Deferred.  Neuro: No focal deficits. Steady gait.  Psych: Mood and affect normal and appropriate for situation.  Extremities: No edema. MSK:  Full range of motion in bilateral upper extremities Skin: Warm and dry. Breast exam: S/p right mastectomy, no nodularity or masses along the surgical incision or  chest wall.  S/p left lumpectomy, incisions completely healed.  No palpable mass in left breast or either axilla that I could appreciate.  Mild hyperpigmentation noted to left breast and right chest wall secondary to radiation  LABORATORY DATA:  CBC Latest Ref Rng & Units 12/22/2018 07/07/2018 03/27/2018  WBC 4.0 - 10.5 K/uL 5.9 5.8 6.9  Hemoglobin 12.0 - 15.0 g/dL 13.7 14.0 12.4  Hematocrit 36.0 - 46.0 % 41.3 41.7 37.0  Platelets 150 - 400 K/uL 215 206 206   CMP Latest Ref Rng & Units 12/22/2018 07/07/2018 03/27/2018  Glucose 70 - 99 mg/dL 108(H) 96 92  BUN 8 - 23 mg/dL 24(H) 18 19  Creatinine 0.44 - 1.00 mg/dL 1.07(H) 0.84 0.72  Sodium 135 - 145 mmol/L 141 141 144  Potassium 3.5 - 5.1 mmol/L 4.7 4.7 4.1  Chloride 98 - 111 mmol/L 106 104 110  CO2 22 - 32 mmol/L '26 30 26  '$ Calcium 8.9 - 10.3 mg/dL 8.8(L) 9.5 8.8(L)  Total Protein 6.5 - 8.1 g/dL 6.3(L) 6.6 5.9(L)  Total Bilirubin 0.3 - 1.2 mg/dL 0.3 0.4 0.3  Alkaline Phos 38 - 126 U/L 99 63 56  AST 15 - 41 U/L 12(L) 15 16  ALT 0 - 44 U/L '10 14 16    '$ DIAGNOSTIC IMAGING:  None for this visit.      ASSESSMENT AND PLAN:  Ms.. Beasley is a pleasant 63 y.o. 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  She presents to the Survivorship Clinic for our initial meeting and routine follow-up post-completion of treatment for breast cancer.    1. Stage IIIA right and stage IA left breast cancer:  Darlene Beasley is continuing to recover from definitive treatment for breast cancer. She will follow-up with her medical oncologist, [name] in 03/2019 with history and physical exam per surveillance protocol.  She will continue her anti-estrogen therapy with letrozole. Thus far,  she is tolerating well with mild hot flash and joint pain. She was instructed to make [name] or myself aware if she begins to experience any worsening side effects of the medication and I could see her back in clinic to help manage those side effects, as needed. She is due for first mammogram since diagnosis, scheduled on unilateral mammogram on the left on 12/23/18. Physical exam today is unremarkable, no clinical concern for recurrence.  Labs reviewed.  Today,  a comprehensive survivorship care plan and treatment summary was reviewed with the patient today detailing her breast cancer diagnosis, treatment course, potential late/long-term effects of treatment, appropriate follow-up care with recommendations for the future, and patient education resources.  A copy of this summary, along with a letter will be sent to the patients primary care provider via In [facility] message after todays visit.    2 .  Hypertension: She has had elevated BP in clinic since she began treatment.  She tried amlodipine 5 mg daily for approximately 1 month then stopped due to minimal effect.  BP 156/96 today.  I encouraged her to follow-up with primary care doctor who has also recommended she get a blood pressure cuff at home.  I reviewed the long-term adverse effects of prolonged uncontrolled HTN.  Cr 1.07 today, I encouraged her to increase p.o. hydration.  3. Bone health:  Given Darlene Beasley's age/history of breast cancer and her current treatment regimen including anti-estrogen therapy with anastrozole, she is at risk for bone demineralization.  Her last DEXA scan was 07/17/18, which showed osteopenia.  She takes calcium and vitamin D supplement.  I encouraged her to increase weight-bearing activities.  She was given education on specific activities to promote bone health.  4. Cancer screening:  Due to Darlene Beasley's history and her age, she should receive screening for skin cancers, colon cancer, and gynecologic cancers.  She has a  pathogenic variant in MITF gene, which carries an increased risk of melanoma.  I referred her to dermatology for annual skin check.  She has informed her son who decided not to get tested.  For colon cancer, she has done Cologuard testing which was reportedly negative, has not had screening colonoscopy. I reviewed this is the gold standard for early colon cancer detection.  She is reluctant but will consider and discuss with her PCP.  The information and recommendations are listed on the patient's comprehensive care plan/treatment summary and were reviewed in detail with the patient.    #. Health maintenance and wellness promotion: Darlene Beasley was encouraged to consume 5-7 servings of fruits and vegetables per day. We reviewed the "Nutrition Rainbow" handout, as well as the handout "Take Control of Your Health and Reduce Your Cancer Risk" from the [place].  She was also encouraged to engage in moderate to vigorous exercise for 30 minutes per day most days of the week. We discussed the [employer] fitness program, which is designed for cancer survivors to help them become more physically fit after cancer treatments.  She was instructed to limit her alcohol consumption and was encouraged stop smoking.  Her partner also smokes cigarettes, I encouraged her to try to quit with him.   #. Support services/counseling: It is not uncommon for this period of the patient's cancer care trajectory to be one of many emotions and stressors.  We discussed an opportunity for her to participate in the next session of [facility] ("Finding Your New Normal") support group series designed for patients after they have completed treatment.   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  She was offered support today through active listening and expressive  supportive counseling.  She was given information regarding our available services and encouraged to contact me with any questions or for help enrolling in any of our support group/programs.    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  -Consider referral back to survivorship as a long-term survivor for continued surveillance  A total of (30) minutes of face-to-face time was spent with this patient with greater than 50% of that time in counseling and care-coordination.   Cira Rue, NP Survivorship Program [facility] [phone]   Note: PRIMARY CARE PROVIDER [name], [place] [phone]

## 2018-12-22 ENCOUNTER — Other Ambulatory Visit: Payer: Self-pay

## 2018-12-22 ENCOUNTER — Encounter: Payer: Self-pay | Admitting: Nurse Practitioner

## 2018-12-22 ENCOUNTER — Inpatient Hospital Stay: Payer: Medicare Other | Attending: Hematology

## 2018-12-22 ENCOUNTER — Inpatient Hospital Stay (HOSPITAL_BASED_OUTPATIENT_CLINIC_OR_DEPARTMENT_OTHER): Payer: Medicare Other | Admitting: Nurse Practitioner

## 2018-12-22 ENCOUNTER — Telehealth: Payer: Self-pay | Admitting: Nurse Practitioner

## 2018-12-22 VITALS — BP 156/96 | HR 74 | Temp 98.5°F | Resp 18 | Ht 64.0 in | Wt 185.5 lb

## 2018-12-22 DIAGNOSIS — F17210 Nicotine dependence, cigarettes, uncomplicated: Secondary | ICD-10-CM | POA: Insufficient documentation

## 2018-12-22 DIAGNOSIS — I10 Essential (primary) hypertension: Secondary | ICD-10-CM | POA: Insufficient documentation

## 2018-12-22 DIAGNOSIS — Z79899 Other long term (current) drug therapy: Secondary | ICD-10-CM | POA: Insufficient documentation

## 2018-12-22 DIAGNOSIS — Z9221 Personal history of antineoplastic chemotherapy: Secondary | ICD-10-CM | POA: Insufficient documentation

## 2018-12-22 DIAGNOSIS — C50412 Malignant neoplasm of upper-outer quadrant of left female breast: Secondary | ICD-10-CM | POA: Insufficient documentation

## 2018-12-22 DIAGNOSIS — M8580 Other specified disorders of bone density and structure, unspecified site: Secondary | ICD-10-CM

## 2018-12-22 DIAGNOSIS — Z79811 Long term (current) use of aromatase inhibitors: Secondary | ICD-10-CM

## 2018-12-22 DIAGNOSIS — Z923 Personal history of irradiation: Secondary | ICD-10-CM | POA: Diagnosis not present

## 2018-12-22 DIAGNOSIS — C50811 Malignant neoplasm of overlapping sites of right female breast: Secondary | ICD-10-CM | POA: Diagnosis not present

## 2018-12-22 DIAGNOSIS — Z9013 Acquired absence of bilateral breasts and nipples: Secondary | ICD-10-CM

## 2018-12-22 DIAGNOSIS — Z170 Estrogen receptor positive status [ER+]: Secondary | ICD-10-CM | POA: Insufficient documentation

## 2018-12-22 DIAGNOSIS — Z1509 Genetic susceptibility to other malignant neoplasm: Secondary | ICD-10-CM

## 2018-12-22 LAB — CBC WITH DIFFERENTIAL (CANCER CENTER ONLY)
Abs Immature Granulocytes: 0.01 10*3/uL (ref 0.00–0.07)
Basophils Absolute: 0.1 10*3/uL (ref 0.0–0.1)
Basophils Relative: 1 %
Eosinophils Absolute: 0.2 10*3/uL (ref 0.0–0.5)
Eosinophils Relative: 3 %
HCT: 41.3 % (ref 36.0–46.0)
Hemoglobin: 13.7 g/dL (ref 12.0–15.0)
Immature Granulocytes: 0 %
Lymphocytes Relative: 21 %
Lymphs Abs: 1.2 10*3/uL (ref 0.7–4.0)
MCH: 31.3 pg (ref 26.0–34.0)
MCHC: 33.2 g/dL (ref 30.0–36.0)
MCV: 94.3 fL (ref 80.0–100.0)
Monocytes Absolute: 0.5 10*3/uL (ref 0.1–1.0)
Monocytes Relative: 9 %
Neutro Abs: 3.9 10*3/uL (ref 1.7–7.7)
Neutrophils Relative %: 66 %
Platelet Count: 215 10*3/uL (ref 150–400)
RBC: 4.38 MIL/uL (ref 3.87–5.11)
RDW: 13.4 % (ref 11.5–15.5)
WBC Count: 5.9 10*3/uL (ref 4.0–10.5)
nRBC: 0 % (ref 0.0–0.2)

## 2018-12-22 LAB — CMP (CANCER CENTER ONLY)
ALT: 10 U/L (ref 0–44)
AST: 12 U/L — ABNORMAL LOW (ref 15–41)
Albumin: 3.7 g/dL (ref 3.5–5.0)
Alkaline Phosphatase: 99 U/L (ref 38–126)
Anion gap: 9 (ref 5–15)
BUN: 24 mg/dL — ABNORMAL HIGH (ref 8–23)
CO2: 26 mmol/L (ref 22–32)
Calcium: 8.8 mg/dL — ABNORMAL LOW (ref 8.9–10.3)
Chloride: 106 mmol/L (ref 98–111)
Creatinine: 1.07 mg/dL — ABNORMAL HIGH (ref 0.44–1.00)
GFR, Est AFR Am: >60 mL/min (ref >60)
GFR, Estimated: 54 mL/min — ABNORMAL LOW (ref >60)
Glucose, Bld: 108 mg/dL — ABNORMAL HIGH (ref 70–99)
Potassium: 4.7 mmol/L (ref 3.5–5.1)
Sodium: 141 mmol/L (ref 135–145)
Total Bilirubin: 0.3 mg/dL (ref 0.3–1.2)
Total Protein: 6.3 g/dL — ABNORMAL LOW (ref 6.5–8.1)

## 2018-12-22 NOTE — Telephone Encounter (Signed)
Sent a fax over to [place] [facility].  I also called them and they went ahead and scheduled her appt.  I called patient and she is aware of her dermatology appt.

## 2018-12-23 ENCOUNTER — Ambulatory Visit
Admission: RE | Admit: 2018-12-23 | Discharge: 2018-12-23 | Disposition: A | Discharge status: Home / Self Care | Payer: Medicare Other | Source: Ambulatory Visit | Attending: Hematology | Admitting: Hematology

## 2018-12-23 DIAGNOSIS — Z9011 Acquired absence of right breast and nipple: Secondary | ICD-10-CM | POA: Diagnosis not present

## 2018-12-23 DIAGNOSIS — Z853 Personal history of malignant neoplasm of breast: Secondary | ICD-10-CM | POA: Diagnosis not present

## 2018-12-23 DIAGNOSIS — R928 Other abnormal and inconclusive findings on diagnostic imaging of breast: Secondary | ICD-10-CM | POA: Diagnosis not present

## 2018-12-23 DIAGNOSIS — C50412 Malignant neoplasm of upper-outer quadrant of left female breast: Secondary | ICD-10-CM

## 2018-12-23 HISTORY — DX: Personal history of antineoplastic chemotherapy: Z92.21

## 2018-12-23 HISTORY — DX: Personal history of irradiation: Z92.3

## 2019-02-17 DIAGNOSIS — L821 Other seborrheic keratosis: Secondary | ICD-10-CM | POA: Diagnosis not present

## 2019-02-17 DIAGNOSIS — D229 Melanocytic nevi, unspecified: Secondary | ICD-10-CM | POA: Diagnosis not present

## 2019-03-04 ENCOUNTER — Encounter (HOSPITAL_COMMUNITY): Payer: Self-pay | Admitting: *Deleted

## 2019-03-18 NOTE — Progress Notes (Signed)
[place]   [phone] [phone]   Clinic Follow up Note   Patient Care Team: [name], MD as PCP - General (Family Medicine) [name], MD as Consulting Physician (General Surgery) [name], MD as Consulting Physician (Hematology) Kyung Rudd, MD as Consulting Physician (Radiation Oncology) Alla Feeling, NP as Nurse Practitioner (Nurse Practitioner)  Date of Service:  03/22/2019  CHIEF COMPLAINT: F/u on bilateral breast cancer  SUMMARY OF ONCOLOGIC HISTORY: Oncology History Overview Note  Cancer Staging Cancer of overlapping sites of right female breast [facility]) Staging form: Breast, AJCC 8th Edition - Clinical stage from 09/23/2017: Stage IIIA (cT3, cN1, cM0, G2, ER+, PR-, HER2-) - Signed by [name], MD on 10/09/2017 - Pathologic stage from 04/03/2018: No Stage Recommended (ypT3, pN2a, cM0, G3, ER+, PR-, HER2-) - Signed by [name], MD on 05/03/2018  Malignant neoplasm of left breast in female, estrogen receptor positive ([place]) Staging form: Breast, AJCC 8th Edition - Clinical: Stage IA (cT1b, cN0, cM0, G1, ER+, PR+, HER2-) - Unsigned - Pathologic stage from 04/03/2018: No Stage Recommended (ypT1b, pN0, cM0, G1, ER+, PR+, HER2-) - Signed by [name], MD on 05/03/2018     Cancer of overlapping sites of right female breast ([place])  09/09/2017 Mammogram   IMPRESSION: 1. Highly suspicious large right breast mass involving the outer and upper breast extending from approximately 9 o'clock through 12-1 o'clock, maximum measurement approximating 7.5 cm. The mass involves the right nipple, accounting for nipple retraction. Architectural distortion, microcalcifications and diffuse trabecular thickening and skin thickening are associated with the large mass, raising the possibility of this representing an inflammatory cancer. 2. Satellite masses separate from the dominant mass involving the lower outer quadrant and the upper inner  quadrant of the right breast, measured above. 3. Two adjacent pathologic right axillary lymph nodes. 4. Indeterminate 0.9 cm solid mass involving the upper outer quadrant of the left breast which accounts for a mammographic finding. 5. No pathologic left axillary lymphadenopathy.   09/09/2017 Breast US   Targeted right breast ultrasound is performed, showing a very large hypoechoic mass with irregular margins extending from the approximate 9 o'clock position through the 12 to 1 o'clock position. On the CC gait image, the mass measures maximally approximately 7.5 cm. The mass has irregular margins, demonstrates acoustic shadowing, and demonstrates internal power Doppler flow. The mass extends into the retroareolar region and involves the nipple, accounting for the nipple retraction.  Separate from the dominant mass at the 7 o'clock position approximately 4 cm from nipple is a hypoechoic antiparallel mass with irregular margins measuring approximately 1.4 x 2.7 x 1.7 cm.  Separate from the dominant mass at the 1 o'clock position approximately 2 cm from nipple is a hypoechoic mass with irregular margins measuring approximately 1.7 x 0.5 x 1.6 cm. Both of these masses also demonstrate internal power Doppler flow.  Sonographic evaluation of the right axilla demonstrates 2 adjacent pathologic lymph nodes, the larger measuring approximately 2.5 x 2.5 x 2.8 cm, the smaller measuring approximately 2.0 x 0.8 x 2.7 cm.   09/23/2017 Initial Biopsy   Diagnosis 1. Breast, left, needle core biopsy, 2 o'clock - INVASIVE DUCTAL CARCINOMA. - SEE COMMENT. 2. Breast, right, needle core biopsy, 11:30 o'clock - INVASIVE MAMMARY CARCINOMA. - SEE COMMENT. 3. Lymph node, needle/core biopsy, right axilla - METASTATIC MAMMARY CARCINOMA IN 1 OF 1 LYMPH NODE (1/1). Microscopic Comment 1. There is a small focus of grade I invasive ductal carcinoma. A complete breast  prognostic profile will be attempted and the results  reported separately. The results were called to the [facility] on 09/24/2017. 2. The carcinoma appears grade II. An E-Cadherin stain and a breast prognostic profile will be performed on part 2 and the results reported separately. (JBK:kh 09-24-17) [name]  1. HER2 Negative by FISH Estrogen receptor: 100% positive, strong staining Progesterone receptor: 80% positive, strong staining Ki67: 5%  2. HER2 Negative by FISH Estrogen receptor: 100% positive, strong staining Progesterone receptor: 0%, negative  Ki67: 30% 2. The tumor cells are strongly positive for E-cadherin, supporting a ductal phenotype   09/23/2017 Cancer Staging   Staging form: Breast, AJCC 8th Edition - Clinical stage from 09/23/2017: Stage IIIA (cT3, cN1, cM0, G2, ER+, PR-, HER2-) - Signed by [name], MD on 10/09/2017   10/07/2017 Breast MRI   IMPRESSION: 1. Biopsy proven malignancy involving all 4 quadrants of the right breast, although predominantly located in the anterior third of the central upper right breast measures 6.8 x 9.6 x 7.2 cm.  2. Five pathologic right axillary lymph nodes, compatible with biopsy proven axillary metastases.  3. Small mass with associated biopsy related changes in the upper-outer left breast at site of known malignancy measures 0.9 x 0.8 x 0.7 cm. No abnormal lymph nodes seen in the left axilla.  RECOMMENDATION: Treatment plan for known bilateral breast malignancy.  BI-RADS CATEGORY  6: Known biopsy-proven malignancy.   10/08/2017 Initial Diagnosis   Cancer of overlapping sites of right female breast ([place])   10/13/2017 Pathology Results   Diagnosis 1. Breast, right, needle core biopsy, 1 o'clock - INVASIVE DUCTAL CARCINOMA. - SEE COMMENT. 2. Breast, right, needle core biopsy, 7 o'clock - INVASIVE DUCTAL CARCINOMA. - SEE COMMENT. Microscopic Comment 1. and 2. The carcinoma in parts 1 and 2 is morphologically similar and appears grade II. Because breast prognostic  profiles were performed on the patients previous case, [id number], it will not be repeated on the current case unless requested.    10/17/2017 Imaging   CT CAP IMPRESSION: 1. Infiltrative right breast mass with overlying skin thickening is identified compatible with known breast cancer. 2. Enlarged right axillary lymph nodes compatible with metastatic adenopathy. 3. No additional sites of disease identified within the chest and no evidence for metastasis to the abdomen or pelvis. 4. Aortic atherosclerosis and 3 vessel coronary artery atherosclerotic calcifications. Aortic Atherosclerosis (ICD10-I70.0).   10/17/2017 Imaging   Bone Scan FINDINGS: There are no foci of increased or decreased radiotracer uptake to suggest osseous metastatic disease. 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 There is degenerative type uptake within both shoulders and the left ankle.  Normal physiologic activity is identified within the kidneys and urinary bladder.  IMPRESSION: No evidence of osseous metastatic disease.   10/22/2017 -  Chemotherapy   AC q2 weeks x4 cycles starting 10/22/17-12/03/17, followed by weekly taxol x12 12/17/17-03/11/18   11/14/2017 Genetic Testing   The genes that were analyzed were the 83 genes on [facility]'s Multi-Cancer panel (ALK, APC, ATM, AXIN2, BAP1, BARD1, BLM, BMPR1A, BRCA1, BRCA2, BRIP1, CASR, CDC73, CDH1, CDK4, CDKN1B, CDKN1C, CDKN2A, CEBPA, CHEK2, CTNNA1, DICER1, DIS3L2, EGFR, EPCAM, FH, FLCN, GATA2, GPC3, GREM1, HOXB13, HRAS, KIT, MAX, MEN1, MET, MITF, MLH1, MSH2, MSH3, MSH6, MUTYH, NBN, NF1, NF2, NTHL1, PALB2, PDGFRA, PHOX2B, PMS2, POLD1, POLE, POT1, PRKAR1A, PTCH1, PTEN, RAD50, RAD51C, RAD51D, RB1, RECQL4, RET, RUNX1, SDHA, SDHAF2, SDHB, SDHC, SDHD, SMAD4, SMARCA4, SMARCB1,  SMARCE1, STK11, SUFU, TERC, TERT, TMEM127, TP53, TSC1, TSC2, VHL, WRN, WT1).  Testingrevealed a pathogenic  mutation in the MITF genecalled c.952G>A (p.Glu318Lys) and A Variant of Unknown Significance (VUS) was also detected: POLD1 c.301A>T (p.Ile101Phe).   12/16/2017 Breast US   IMPRESSION: 1. Stable to minimal decrease in size of multiple irregular masses throughout the right breast consistent with the patient's sites of biopsy proven malignancy. While the mammographic appearance is slightly less dense in this region, there is increased skin and trabecular thickening involving the entire right breast. 2. Stable appearance of morphologically abnormal right axillary lymph nodes, consistent with biopsy proven metastatic disease. 3. Stable appearance of a left breast mass, consistent with biopsy proven malignancy.    04/03/2018 Cancer Staging   Staging form: Breast, AJCC 8th Edition - Pathologic stage from 04/03/2018: No Stage Recommended (ypT3, pN2a, cM0, G3, ER+, PR-, HER2-) - Signed by [name], MD on 05/03/2018   10/19/2018 -  Anti-estrogen oral therapy   Adjuvant letrozole 2.33m daily     Survivorship   Per LCira Rue NP    Malignant neoplasm of left breast in female, estrogen receptor positive ([place]  09/09/2017 Breast [place]  Targeted left breast ultrasound is performed, showing an oval parallel hypoechoic mass containing microcalcifications at the 2 o'clock position approximately 6 cm from nipple measuring approximately 0.9 x 0.5 x 0.9 cm, corresponding to the mammographic finding.  Sonographic evaluation of the left axilla demonstrates no pathologic lymphadenopathy.   09/23/2017 Initial Biopsy   Diagnosis 1. Breast, left, needle core biopsy, 2 o'clock - INVASIVE DUCTAL CARCINOMA. - SEE COMMENT. 2. Breast, right, needle core biopsy, 11:30 o'clock - INVASIVE MAMMARY CARCINOMA. - SEE COMMENT. 3. Lymph node, needle/core biopsy, right axilla - METASTATIC MAMMARY CARCINOMA IN 1 OF 1 LYMPH NODE (1/1). Microscopic Comment 1. There is a small focus of grade I invasive ductal carcinoma. A  complete breast prognostic profile will be attempted and the results reported separately. The results were called to the [facility] 09/24/2017. 2. The carcinoma appears grade II. An E-Cadherin stain and a breast prognostic profile will be performed on part 2 and the results reported separately. (JBK:kh 09-24-17) [name]  1. HER2 Negative by FISH Estrogen receptor: 100% positive, strong staining Progesterone receptor: 80% positive, strong staining Ki67: 5%  2. HER2 Negative by FISH Estrogen receptor: 100% positive, strong staining Progesterone receptor: 0%, negative  Ki67: 30%   10/08/2017 Initial Diagnosis   Malignant neoplasm of left breast in female, estrogen receptor positive ([place]   12/16/2017 Breast [place]  IMPRESSION: 3. Stable appearance of a left breast mass, consistent with biopsy proven malignancy.    01/22/2018 Imaging   01/22/2018 MRI Bilateral Breast IMPRESSION: 1. Dominant enhancing mass with surrounding nodularity involving the central anterior third of the right breast now measures up to 6.4 x 6.7 x 7.6 cm (previously measured 6.8 x 9.6 x 7.2 cm). Persistent bulky right axillary lymphadenopathy, slightly decreased in size since prior MRI.  2. Biopsy proven left breast malignancy now measures 0.5 cm (previously measured 0.9 x 0.8 x 0.7 cm).    04/03/2018 Pathology Results   Diagnosis 1. Breast, lumpectomy, left with radioactive seed - RESIDUAL INVASIVE DUCTAL CARCINOMA STATUS POST NEOADJUVANT THERAPY (0.9 CM) - MARGINS UNINVOLVED BY CARCINOMA (0.2 CM; INFERIOR MARGIN) - CALCIFICATIONS ASSOCIATED WITH CARCINOMA - PREVIOUS BIOPSY SITE CHANGES PRESENT - SEE ONCOLOGY TABLE AND COMMENT BELOW 2. Lymph node, sentinel, biopsy, left axillary #1 - NO CARCINOMA IDENTIFIED IN ONE LYMPH  NODE (0/1) - SEE COMMENT 3. Lymph node, sentinel, biopsy, left axillary #2 - NO CARCINOMA IDENTIFIED IN ONE LYMPH NODE (0/1) - SEE COMMENT 4. Lymph node, sentinel,  biopsy, left - NO CARCINOMA IDENTIFIED IN ONE LYMPH NODE (0/1) - SEE COMMENT 5. Lymph node, sentinel, biopsy, left axillary #3 - NO CARCINOMA IDENTIFIED IN ONE LYMPH NODE (0/1) - SEE COMMENT 6. Breast, modified radical mastectomy , right - RESIDUAL INVASIVE DUCTAL CARCINOMA STATUS POST NEOADJUVANT THERAPY (8.5 CM) - EXTENSIVE LYMPHOVASCULAR SPACE INVASION PRESENT - METASTATIC CARCINOMA INVOLVING FIVE OF FIFTEEN LYMPH NODES WITH EXTRACAPSULAR EXTENSION (5/15) - MARGINS UNINVOLVED BY CARCINOMA - SEE ONCOLOGY TABLE AND COMMENT BELOW   04/03/2018 Receptors her2   The tumor cells are NEGATIVE for Her2 (1+). Estrogen Receptor: 95%, POSITIVE, STRONG STAINING INTENSITY Progesterone Receptor: 95%, POSITIVE, STRONG STAINING INTENSITY  The tumor cells are NEGATIVE for Her2 (1+). Estrogen Receptor: 100%, POSITIVE, STRONG STAINING INTENSITY Progesterone Receptor: 0%, NEGATIVE   04/03/2018 Cancer Staging   Staging form: Breast, AJCC 8th Edition - Pathologic stage from 04/03/2018: No Stage Recommended (ypT1b, pN0, cM0, G1, ER+, PR+, HER2-) - Signed by [name], MD on 05/03/2018   04/03/2018 Surgery   Left LEFT BREAST LUMPECTOMY WITH RADIOACTIVE SEED AND LEFT SENTINEL LYMPH NODE BIOPSY with [name], MD    05/20/2018 - 07/08/2018 Radiation Therapy   Daily adjuvant radiation treatment with Dr.Moody.    Radiation treatment dates:   05/20/2018 - 07/08/2018  Site/dose:   The patient initially received a dose of 50.4 Gy in 28 fractions to the right chest wall and supraclavicular region. This was delivered using a 3-D conformal, 4 field technique. The patient then received a boost to the mastectomy scar. This delivered an additional 10 Gy in 5 fractions using an en face electron field. The total dose was 60.4 Gy. At the same time, the patient received a dose of 50.4 Gy in 28 fractions to the left breast using whole-breast tangent fields. This was delivered using a 3-D conformal technique. The  patient then received a boost to the seroma. This delivered an additional 10 Gy in 5 fractions using 15E electrons with a special teletherapy technique. The total dose was 60.4 Gy.   10/19/2018 -  Anti-estrogen oral therapy   Letrozole 2.38m daily started 07/2018     Survivorship   Per LCira Rue NP       CURRENT THERAPY:  Letrozole 2.540mdaily starting 10/2018  INTERVAL HISTORY:  IvZeta Bucys here for a follow up of b/l breast cancer. She presents to the clinic alone. She notes she is doing well. She started back full time at work today. She notes she is tolerating letrozole. She notes sweating at night but nothing drenching. She notes fatigue. She attributes her joint stiffness to her working, not worsened with letrozole.  She notes she started keto diet and was able to lose weight. Her carb intake is very low. She notes her goal weight is 135-140 pounds. She notes she has not been to see her PCP since cancer diagnosis and prior to that she only saw him 3 times in 10 years. She will monitor her BP at home. She notes her stress level may contribute to having a nervous stomach and may occasionally cough up something. She notes a family member was also in high stress levels and had Barrett's Esophagus.  She notes a few weeks ago she fell at work and fell flat on her back. She had MRI and no fracture was  seen. She was out of work for 1 week and has recovered well.    REVIEW OF SYSTEMS:   Constitutional: Denies fevers, chills or abnormal weight loss (+) Manageable night sweats (+) Fatigue  Eyes: Denies blurriness of vision Ears, nose, mouth, throat, and face: Denies mucositis or sore throat Respiratory: Denies cough, dyspnea or wheezes Cardiovascular: Denies palpitation, chest discomfort or lower extremity swelling Gastrointestinal:  Denies nausea, heartburn or change in bowel habits Skin: Denies abnormal skin rashes Lymphatics: Denies new lymphadenopathy or easy bruising  Neurological:Denies numbness, tingling or new weaknesses Behavioral/Psych: Mood is stable, no new changes  All other systems were reviewed with the patient and are negative.  MEDICAL HISTORY:  Past Medical History:  Diagnosis Date  . Adopted   . Arthritis   . breast ca dx'd 08/2017  . Genetic testing 11/14/2017   Multi-Cancer panel (83 genes) @ [facility] - Pathogenic mutation in the MITF gene  . GERD (gastroesophageal reflux disease)   . 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 Personal history of chemotherapy   . Personal history of radiation therapy     SURGICAL HISTORY: Past Surgical History:  Procedure Laterality Date  . BREAST LUMPECTOMY Left 04/03/2018  . BREAST LUMPECTOMY WITH RADIOACTIVE SEED AND SENTINEL LYMPH NODE BIOPSY Left 04/03/2018   Procedure: LEFT BREAST LUMPECTOMY WITH RADIOACTIVE SEED AND LEFT SENTINEL LYMPH NODE BIOPSY;  Surgeon: [name], MD;  Location: [place];  Service: General;  Laterality: Left;  . IR IMAGING GUIDED PORT INSERTION  10/20/2017  . IR US GUIDE VASC ACCESS LEFT  10/20/2017  . MASTECTOMY Right 04/03/2018  . MASTECTOMY MODIFIED RADICAL Right 04/03/2018   Procedure: MASTECTOMY MODIFIED RADICAL;  Surgeon: [name], MD;  Location: [place];  Service: General;  Laterality: Right;  . PORT-A-CATH REMOVAL N/A 04/03/2018   Procedure: REMOVAL PORT-A-CATH;  Surgeon: [name], MD;  Location: [place];  Service: General;  Laterality: N/A;  . TONSILLECTOMY Bilateral   . TUBAL LIGATION      I have reviewed the social history and family history with the patient and they are unchanged from previous note.  ALLERGIES:  is allergic to bee venom.  MEDICATIONS:  Current Outpatient Medications  Medication Sig Dispense Refill  . letrozole (FEMARA) 2.5 MG tablet Take 1 tablet (2.5 mg total) by mouth daily. 30 tablet 5  . Multiple  Vitamin (MULTIVITAMIN) tablet Take 1 tablet by mouth daily.     No current facility-administered medications for this visit.     PHYSICAL EXAMINATION: ECOG PERFORMANCE STATUS: 0 - Asymptomatic  Vitals:   03/22/19 1258  BP: (!) 180/96  Pulse: 78  Resp: 17  Temp: 97.8 F (36.6 C)  SpO2: 100%   Filed Weights   03/22/19 1258  Weight: 171 lb 14.4 oz (78 kg)    GENERAL:alert, no distress and comfortable SKIN: skin color, texture, turgor are normal, no rashes or significant lesions EYES: normal, Conjunctiva are pink and non-injected, sclera clear  NECK: supple, thyroid normal size, non-tender, without nodularity LYMPH:  no palpable lymphadenopathy in the cervical, axillary  LUNGS: clear to auscultation and percussion with normal breathing effort HEART: regular rate & rhythm and no murmurs and no lower extremity edema ABDOMEN:abdomen soft, non-tender and normal bowel sounds Musculoskeletal:no cyanosis of digits and no clubbing  NEURO: alert & oriented x 3 with fluent speech, no focal motor/sensory deficits BREAST: S/p left lumpectomy and right mastectomy: surgical incision healed  well. No palpable mass, nodules or adenopathy bilaterally. Breast exam benign.   LABORATORY DATA:  I have reviewed the data as listed CBC Latest Ref Rng & Units 03/22/2019 12/22/2018 07/07/2018  WBC 4.0 - 10.5 K/uL 6.3 5.9 5.8  Hemoglobin 12.0 - 15.0 g/dL 14.3 13.7 14.0  Hematocrit 36.0 - 46.0 % 42.8 41.3 41.7  Platelets 150 - 400 K/uL 219 215 206     CMP Latest Ref Rng & Units 03/22/2019 12/22/2018 07/07/2018  Glucose 70 - 99 mg/dL 105(H) 108(H) 96  BUN 8 - 23 mg/dL 16 24(H) 18  Creatinine 0.44 - 1.00 mg/dL 0.86 1.07(H) 0.84  Sodium 135 - 145 mmol/L 140 141 141  Potassium 3.5 - 5.1 mmol/L 4.2 4.7 4.7  Chloride 98 - 111 mmol/L 105 106 104  CO2 22 - 32 mmol/L _0 Calcium 8.9 - 10.3 mg/dL 9.2 8.8(L) 9.5  Total Protein 6.5 - 8.1 g/dL 6.5 6.3(L) 6.6  Total Bilirubin 0.3 - 1.2 mg/dL 0.5 0.3 0.4   Alkaline Phos 38 - 126 U/L 71 99 63  AST 15 - 41 U/L 14(L) 12(L) 15  ALT 0 - 44 U/L _1 RADIOGRAPHIC STUDIES: I have personally reviewed the radiological images as listed and agreed with the findings in the report. No results found.   ASSESSMENT & PLAN:  Darlene Beasley is a 63 y.o. female with   1.Bilateral breast cancer: right breast, invasiveductalcarcinoma in right, grade II, stage IIIA (cT3N1M0), ER 100% positive, PR 0%, negative, HER2 negative by FISH, Ki67 30% metastatic to 5 axillary lymph nodes, ypT3N2a; left breast - invasive ductal carcinoma, grade I, stage IA (cT1bN0M0), ER 100% positive, PR 80% positive, HER2 negative, Ki67 5%, ypT1bN0 -Diagnosed on 09/2017. 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  -She is s/p neoadjuvant chemo withAC-T, underwent left breast lumpectomy and right mastectomy. She had a very limited response to neoadjuvant chemotherapy. She also completed adjuvant radiation.  -She started antiestrogen therapy with Letrozole in 10/2018. Tolerating well with manageable night sweats and fatigue. No change in joint stiffness.  -From a breast cancer standpoint she is clinically doing well. Lab reviewed, her CBC and CMP are within normal limits. Her physical exam and her 12/2018 mammogram were unremarkable. There is no clinical concern for recurrence. -Continue surveillance. Next mammogram in 12/2019  -Continue Letrozole  -I discussed the option of clinical trials including the [facility] trail and [facility] trail with oral Kisquali for locally advanced breast cancer. I reviewed risk and benefits. She declined for now.  -I encouraged her to follow up with PCP at least yearly for routine checkups and health management.  -F/u in 5 months  -I offered her flu shot today, she declined.   2.Smokingcessation  -She has a long history of smoking,she has cut down significantly, still smokes several cigarettes a day -Again encouraged her to quit smoking  completely.  3.HTN -She not currently on BP medication -BP has been high lately, at 180/96 today (03/22/19). I encouraged her to monitor her levels at home. I offered her BP medication, she declined  -I encouraged her tof/u with PCP as she will likely need to start antihypertension medication  -She is apprehensive about taking medication. I encouraged her to reduce salt intake and continue to manage her weight and stress level.   4.Peripheral neuropathy,due tochemotherapy -Much improved, overall mild, will monitor.  5. Osteopenia -Her 07/2018 DEXA shows osteopenia with lowest T-score of -2.1 at right femur  neck  -She had a fall 3 weeks ago at work and fell flat on her back. MRI was negative for fracture.  -I discussed option of Bisphosphonate Injection, infusion and orally to strengthen her bones. I reviewed main side effects of bone pain and jaw necrosis. I strongly encouraged her to keep up with dental healthy and cleanings.  -continue calcium and VitD  6. Weight management  -She restarted keto diet and has been successfully losing weight. She notes she does still eat low carbs and wants to lose weight gradually.  -Her goal weight is 135-140 pounds.   7. Cancer screening  -she never had colonoscopy.  I encouraged her to consider colonoscopy for colon cancer screening. She notes her stool cards in the past have been negative. She will think about it  -I encouraged her to follow-up with PCP for Pap smear  Plan -Lab and f/u in 5 months  -Continue Letrozole, refilled today  -she declined BP meds, will f/u with PCP    No problem-specific Assessment & Plan notes found for this encounter.   No orders of the defined types were placed in this encounter.  All questions were answered. The patient knows to call the clinic with any problems, questions or concerns. No barriers to learning was detected. I spent 20 minutes counseling the patient face to face. The total time spent  in the appointment was 30 minutes and more than 50% was on counseling and review of test results     [name], MD 03/22/2019   I, Joslyn Devon, am acting as scribe for [name], MD.   I have reviewed the above documentation for accuracy and completeness, and I agree with the above.

## 2019-03-22 ENCOUNTER — Inpatient Hospital Stay: Payer: Medicare HMO | Attending: Hematology

## 2019-03-22 ENCOUNTER — Other Ambulatory Visit: Payer: Self-pay

## 2019-03-22 ENCOUNTER — Inpatient Hospital Stay (HOSPITAL_BASED_OUTPATIENT_CLINIC_OR_DEPARTMENT_OTHER): Payer: Medicare HMO | Admitting: Hematology

## 2019-03-22 VITALS — BP 180/96 | HR 78 | Temp 97.8°F | Resp 17 | Ht 64.0 in | Wt 171.9 lb

## 2019-03-22 DIAGNOSIS — C50811 Malignant neoplasm of overlapping sites of right female breast: Secondary | ICD-10-CM | POA: Diagnosis not present

## 2019-03-22 DIAGNOSIS — Z170 Estrogen receptor positive status [ER+]: Secondary | ICD-10-CM

## 2019-03-22 DIAGNOSIS — C50912 Malignant neoplasm of unspecified site of left female breast: Secondary | ICD-10-CM | POA: Diagnosis not present

## 2019-03-22 DIAGNOSIS — C50412 Malignant neoplasm of upper-outer quadrant of left female breast: Secondary | ICD-10-CM

## 2019-03-22 DIAGNOSIS — M8580 Other specified disorders of bone density and structure, unspecified site: Secondary | ICD-10-CM | POA: Insufficient documentation

## 2019-03-22 DIAGNOSIS — I10 Essential (primary) hypertension: Secondary | ICD-10-CM | POA: Diagnosis not present

## 2019-03-22 DIAGNOSIS — C773 Secondary and unspecified malignant neoplasm of axilla and upper limb lymph nodes: Secondary | ICD-10-CM | POA: Insufficient documentation

## 2019-03-22 DIAGNOSIS — Z79811 Long term (current) use of aromatase inhibitors: Secondary | ICD-10-CM | POA: Insufficient documentation

## 2019-03-22 LAB — CBC WITH DIFFERENTIAL (CANCER CENTER ONLY)
Abs Immature Granulocytes: 0.01 10*3/uL (ref 0.00–0.07)
Basophils Absolute: 0 10*3/uL (ref 0.0–0.1)
Basophils Relative: 1 %
Eosinophils Absolute: 0.2 10*3/uL (ref 0.0–0.5)
Eosinophils Relative: 2 %
HCT: 42.8 % (ref 36.0–46.0)
Hemoglobin: 14.3 g/dL (ref 12.0–15.0)
Immature Granulocytes: 0 %
Lymphocytes Relative: 22 %
Lymphs Abs: 1.4 10*3/uL (ref 0.7–4.0)
MCH: 31.4 pg (ref 26.0–34.0)
MCHC: 33.4 g/dL (ref 30.0–36.0)
MCV: 93.9 fL (ref 80.0–100.0)
Monocytes Absolute: 0.4 10*3/uL (ref 0.1–1.0)
Monocytes Relative: 6 %
Neutro Abs: 4.4 10*3/uL (ref 1.7–7.7)
Neutrophils Relative %: 69 %
Platelet Count: 219 10*3/uL (ref 150–400)
RBC: 4.56 MIL/uL (ref 3.87–5.11)
RDW: 13.2 % (ref 11.5–15.5)
WBC Count: 6.3 10*3/uL (ref 4.0–10.5)
nRBC: 0 % (ref 0.0–0.2)

## 2019-03-22 LAB — CMP (CANCER CENTER ONLY)
ALT: 12 U/L (ref 0–44)
AST: 14 U/L — ABNORMAL LOW (ref 15–41)
Albumin: 3.9 g/dL (ref 3.5–5.0)
Alkaline Phosphatase: 71 U/L (ref 38–126)
Anion gap: 7 (ref 5–15)
BUN: 16 mg/dL (ref 8–23)
CO2: 28 mmol/L (ref 22–32)
Calcium: 9.2 mg/dL (ref 8.9–10.3)
Chloride: 105 mmol/L (ref 98–111)
Creatinine: 0.86 mg/dL (ref 0.44–1.00)
GFR, Est AFR Am: >60 mL/min (ref >60)
GFR, Estimated: >60 mL/min (ref >60)
Glucose, Bld: 105 mg/dL — ABNORMAL HIGH (ref 70–99)
Potassium: 4.2 mmol/L (ref 3.5–5.1)
Sodium: 140 mmol/L (ref 135–145)
Total Bilirubin: 0.5 mg/dL (ref 0.3–1.2)
Total Protein: 6.5 g/dL (ref 6.5–8.1)

## 2019-03-22 MED ORDER — LETROZOLE 2.5 MG PO TABS: 2.5000 mg | ORAL_TABLET | Freq: Every day | ORAL | 5 refills | Status: DC

## 2019-03-23 ENCOUNTER — Telehealth: Payer: Self-pay | Admitting: Hematology

## 2019-03-23 ENCOUNTER — Encounter: Payer: Self-pay | Admitting: Hematology

## 2019-03-23 NOTE — Telephone Encounter (Signed)
Scheduled appt per 10/12 los.  Voice mail box not set up.  Sent a staff message to get a calendar mailed out.

## 2019-05-06 IMAGING — MG DIGITAL DIAGNOSTIC BILATERAL MAMMOGRAM WITH TOMO AND CAD
8 series · 8 of 24 positions shown · non-contrast
Comparison: Previous exam(s).

CLINICAL DATA: 62-year-old female with multicentric invasive right
breast cancer metastatic to the right axillary lymph nodes, as well
as a single focus of invasive breast cancer on the left. The patient
is currently undergoing chemotherapy and presents for evaluation for
treatment response.

EXAM:
DIGITAL DIAGNOSTIC BILATERAL MAMMOGRAM WITH CAD AND TOMO
ULTRASOUND BILATERAL BREAST

[R CC synth-2D]
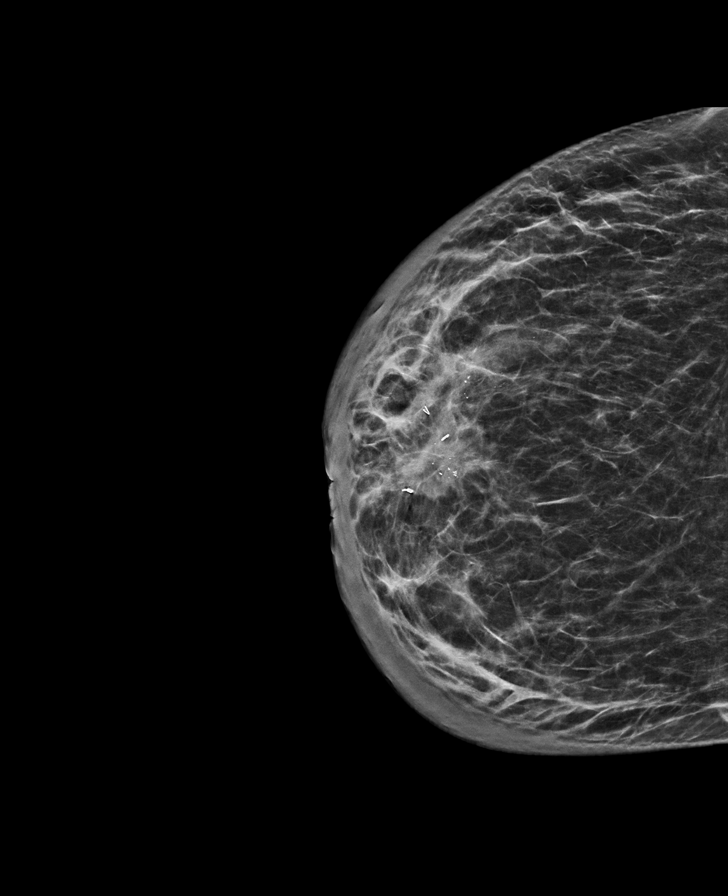

[R MLO synth-2D]
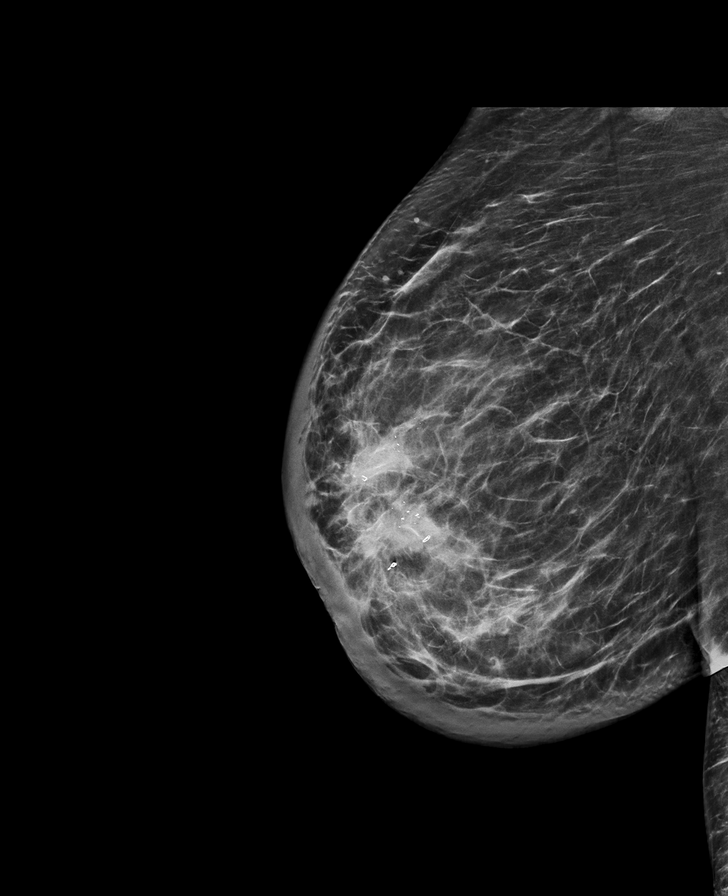

[L CC synth-2D]
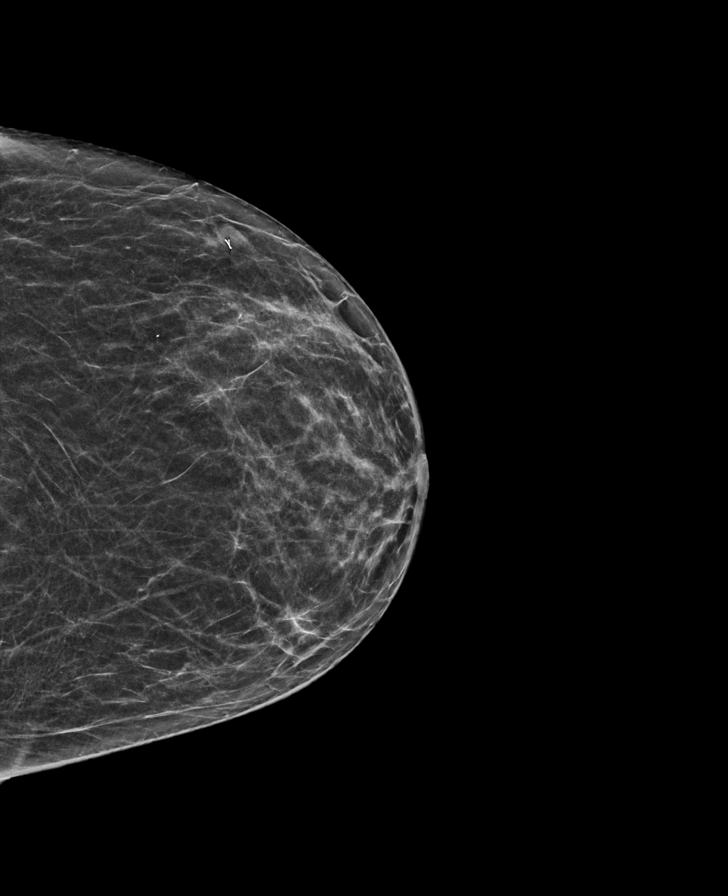

[L MLO synth-2D]
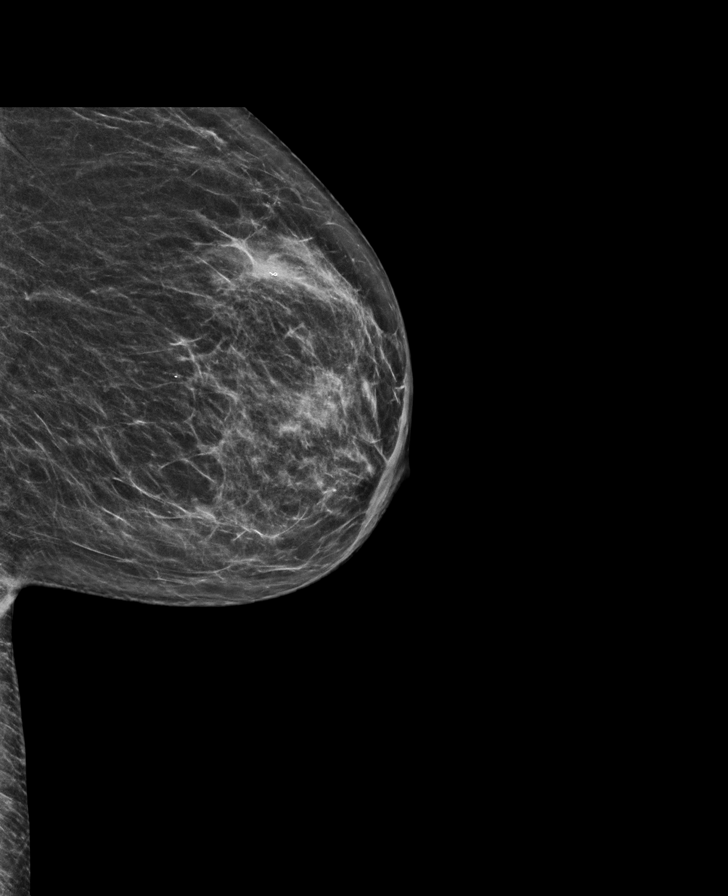

[L CC tomo · tomo slice 27/54.0]
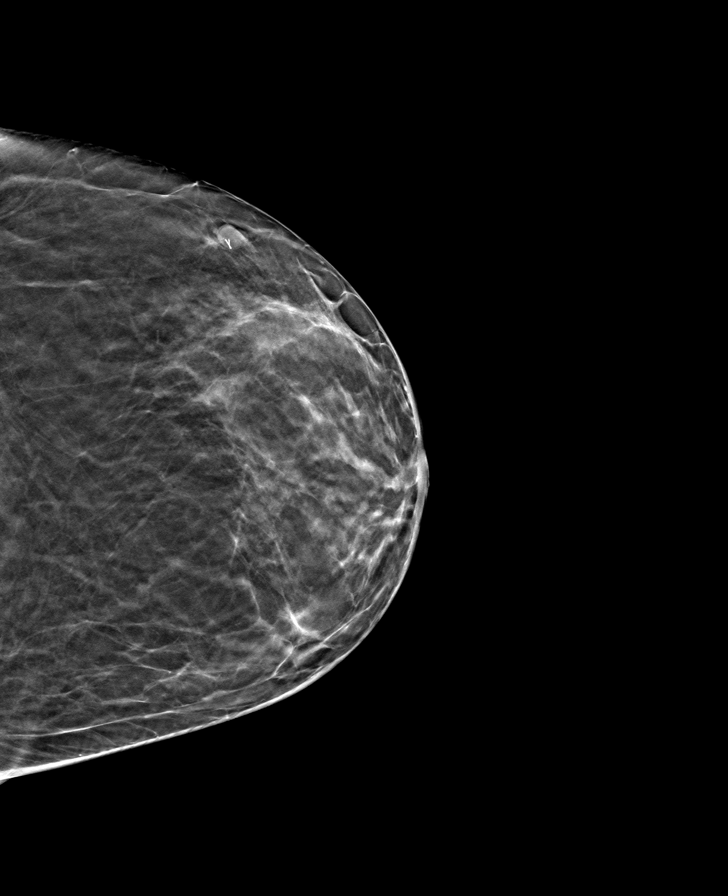

[R CC tomo · tomo slice 35/70.0]
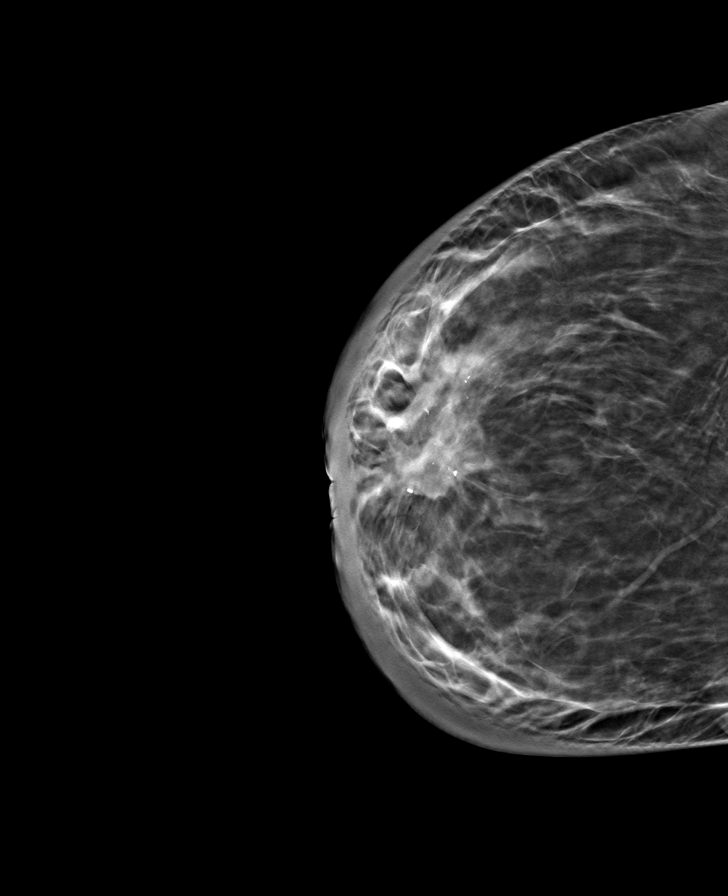

[R MLO tomo · tomo slice 41/80.0]
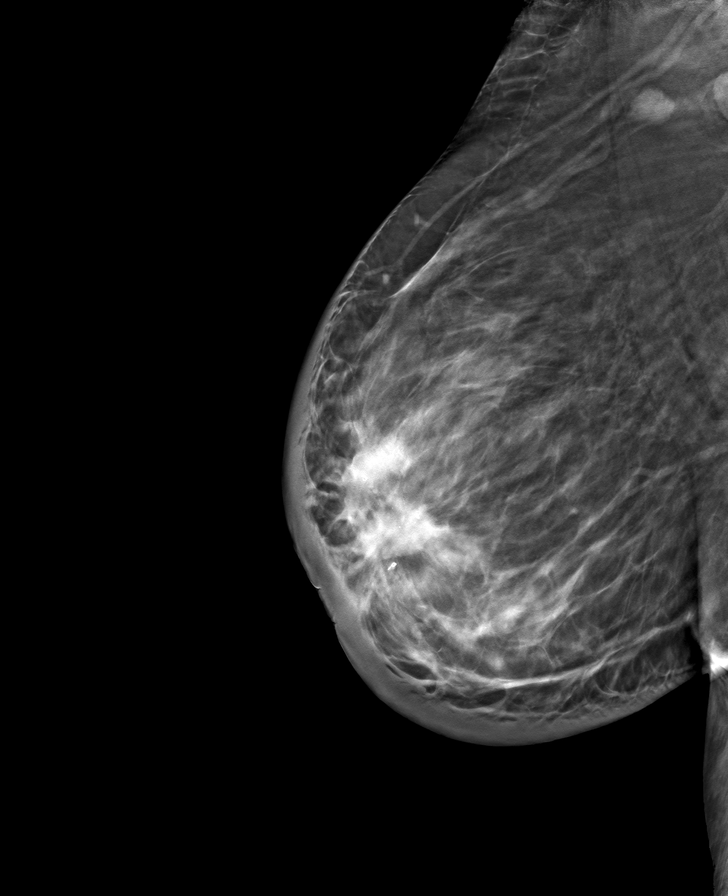

[L MLO tomo · tomo slice 31/61.0]
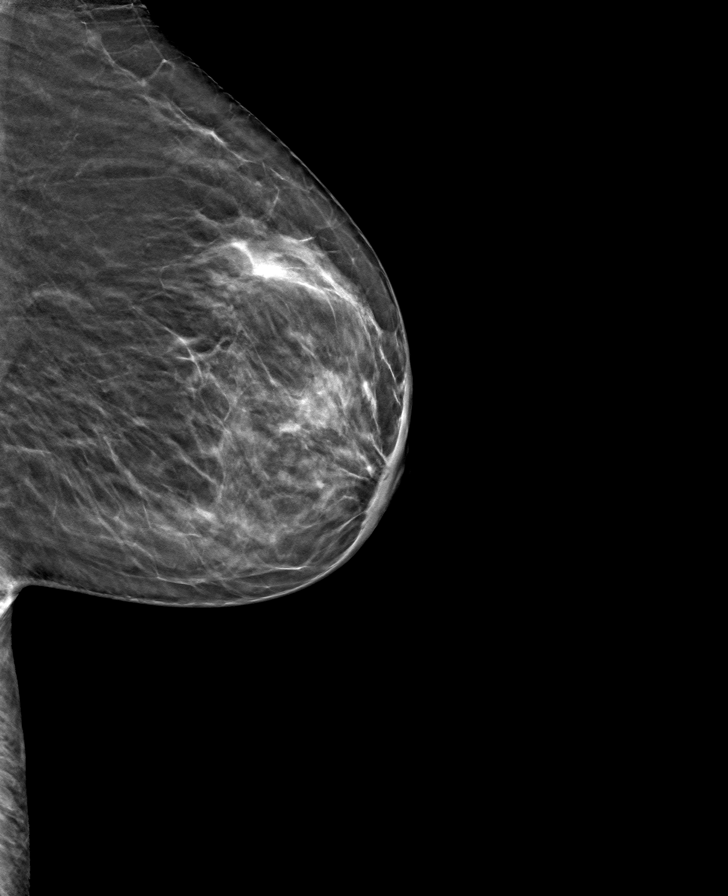

[8 of 24 positions shown; findings below may reference images not displayed]

ACR Breast Density Category c: The breast tissue is heterogeneously
dense, which may obscure small masses.
FINDINGS: Targeted ultrasound is performed, showing numerous irregular,
hypoechoic masses scattered throughout the right breast. These
measure stable to minimally decreased in size when compared to prior
mammograms. A mass at the 1 o'clock position 2 cm from the nipple
measures 1.7 x 0.9 x 0.9 cm (previously 1.7 x 1.6 x 0.5 cm). A mass
at the 7 o'clock position 4 cm from the nipple measures 1.6 x 1.6 x
1.4 cm (previously 2.7 x 1.7 x 1.4 cm). A large mass involving the
upper outer quadrant measures 7.6 x 2.1 cm (previously 7.4 x
cm). A large right axillary lymph node measures 2.9 x 2.3 x 2.1 cm
(previously 2.8 x 2.5 x 2.5 cm).

Evaluation of the left breast demonstrates grossly unchanged
appearance of a circumscribed mass at the 2 o'clock position 6 cm
from the nipple. It measures 0.9 x 0.8 x 0.6 cm (previously 0.9 x
0.9 x 0.5 cm).

The patient was then brought to the mammographic suite for
additional evaluation of the bilateral breast given the diffuse
disease identified sonographically.

A global asymmetry with associated distortion and post biopsy clips
in the upper outer quadrant of the right breast appears slightly
less dense on today's mammographic views. There is increased skin
and trabecular thickening involving the entire right breast.

Mammographic images were processed with CAD.
IMPRESSION: 1. Stable to minimal decrease in size of multiple irregular masses
throughout the right breast consistent with the patient's sites of
biopsy proven malignancy. While the mammographic appearance is
slightly less dense in this region, there is increased skin and
trabecular thickening involving the entire right breast.
2. Stable appearance of morphologically abnormal right axillary
lymph nodes, consistent with biopsy proven metastatic disease.
3. Stable appearance of a left breast mass, consistent with biopsy
proven malignancy.

RECOMMENDATION:
Per treatment plan.

I have discussed the findings and recommendations with the patient.
Results were also provided in writing at the conclusion of the
visit. If applicable, a reminder letter will be sent to the patient
regarding the next appointment.

BI-RADS CATEGORY  6: Known biopsy-proven malignancy.

## 2019-05-10 ENCOUNTER — Other Ambulatory Visit: Payer: Self-pay

## 2019-05-10 ENCOUNTER — Encounter: Payer: Self-pay | Admitting: Family Medicine

## 2019-05-10 ENCOUNTER — Ambulatory Visit (INDEPENDENT_AMBULATORY_CARE_PROVIDER_SITE_OTHER): Payer: Medicare HMO | Admitting: Family Medicine

## 2019-05-10 VITALS — BP 136/82 | HR 89 | Temp 97.6°F | Wt 171.4 lb

## 2019-05-10 DIAGNOSIS — E785 Hyperlipidemia, unspecified: Secondary | ICD-10-CM | POA: Diagnosis not present

## 2019-05-10 DIAGNOSIS — R739 Hyperglycemia, unspecified: Secondary | ICD-10-CM

## 2019-05-10 DIAGNOSIS — C50911 Malignant neoplasm of unspecified site of right female breast: Secondary | ICD-10-CM

## 2019-05-10 DIAGNOSIS — Z0000 Encounter for general adult medical examination without abnormal findings: Secondary | ICD-10-CM

## 2019-05-10 DIAGNOSIS — Z170 Estrogen receptor positive status [ER+]: Secondary | ICD-10-CM

## 2019-05-10 DIAGNOSIS — R030 Elevated blood-pressure reading, without diagnosis of hypertension: Secondary | ICD-10-CM

## 2019-05-10 NOTE — Patient Instructions (Addendum)
Keep a record of your blood pressures outside of the office and if you obtain readings over 140/90 - schedule visit. Be seen right away if over 160/100.   Info on vaccines. I do recommend flu, pneumonia and shingles vaccines. Let me know if you change your mind.  [email or web address].[email or web address]  I do recommend meeting with your eye care provider as well as dentist.  Schedule those appointments when possible.  Thank you for coming in today, and please let me know if there are questions.  Return to the clinic or go to the nearest emergency room if any of your symptoms worsen or new symptoms occur.     Preventive Care 65 Years and Older, Female Preventive care refers to lifestyle choices and visits with your health care provider that can promote health and wellness. This includes:  A yearly physical exam. This is also called an annual well check.  Regular dental and eye exams.  Immunizations.  Screening for certain conditions.  Healthy lifestyle choices, such as diet and exercise. What can I expect for my preventive care visit? Physical exam Your health care provider will check:  Height and weight. These may be used to calculate body mass index (BMI), which is a measurement that tells if you are at a healthy weight.  Heart rate and blood pressure.  Your skin for abnormal spots. Counseling Your health care provider may ask you questions about:  Alcohol, tobacco, and drug use.  Emotional well-being.  Home and relationship well-being.  Sexual activity.  Eating habits.  History of falls.  Memory and ability to understand (cognition).  Work and work Statistician.  Pregnancy and menstrual history. What immunizations do I need?  Influenza (flu) vaccine  This is recommended every year. Tetanus, diphtheria, and pertussis (Tdap) vaccine  You may need a Td booster every 10 years. Varicella (chickenpox) vaccine  You may need this vaccine if you have not  already been vaccinated. Zoster (shingles) vaccine  You may need this after age 10. Pneumococcal conjugate (PCV13) vaccine  One dose is recommended after age 65. Pneumococcal polysaccharide (PPSV23) vaccine  One dose is recommended after age 65. Measles, mumps, and rubella (MMR) vaccine  You may need at least one dose of MMR if you were born in 1957 or later. You may also need a second dose. Meningococcal conjugate (MenACWY) vaccine  You may need this if you have certain conditions. Hepatitis A vaccine  You may need this if you have certain conditions or if you travel or work in places where you may be exposed to hepatitis A. Hepatitis B vaccine  You may need this if you have certain conditions or if you travel or work in places where you may be exposed to hepatitis B. Haemophilus influenzae type b (Hib) vaccine  You may need this if you have certain conditions. You may receive vaccines as individual doses or as more than one vaccine together in one shot (combination vaccines). Talk with your health care provider about the risks and benefits of combination vaccines. What tests do I need? Blood tests  Lipid and cholesterol levels. These may be checked every 5 years, or more frequently depending on your overall health.  Hepatitis C test.  Hepatitis B test. Screening  Lung cancer screening. You may have this screening every year starting at age 65 if you have a 30-pack-year history of smoking and currently smoke or have quit within the past 15 years.  Colorectal cancer screening. All adults should have this screening starting at  age 62 and continuing until age 65. Your health care provider may recommend screening at age 65 if you are at increased risk. You will have tests every 1-10 years, depending on your results and the type of screening test.  Diabetes screening. This is done by checking your blood sugar (glucose) after you have not eaten for a while (fasting). You may have  this done every 1-3 years.  Mammogram. This may be done every 1-2 years. Talk with your health care provider about how often you should have regular mammograms.  BRCA-related cancer screening. This may be done if you have a family history of breast, ovarian, tubal, or peritoneal cancers. Other tests  Sexually transmitted disease (STD) testing.  Bone density scan. This is done to screen for osteoporosis. You may have this done starting at age 65. Follow these instructions at home: Eating and drinking  Eat a diet that includes fresh fruits and vegetables, whole grains, lean protein, and low-fat dairy products. Limit your intake of foods with high amounts of sugar, saturated fats, and salt.  Take vitamin and mineral supplements as recommended by your health care provider.  Do not drink alcohol if your health care provider tells you not to drink.  If you drink alcohol: ? Limit how much you have to 0-1 drink a day. ? Be aware of how much alcohol is in your drink. In the [place] one drink equals one 12 oz bottle of beer (355 mL), one 5 oz glass of wine (148 mL), or one 1 oz glass of hard liquor (44 mL). Lifestyle  Take daily care of your teeth and gums.  Stay active. Exercise for at least 30 minutes on 5 or more days each week.  Do not use any products that contain nicotine or tobacco, such as cigarettes, e-cigarettes, and chewing tobacco. If you need help quitting, ask your health care provider.  If you are sexually active, practice safe sex. Use a condom or other form of protection in order to prevent STIs (sexually transmitted infections).  Talk with your health care provider about taking a low-dose aspirin or statin. What's next?  Go to your health care provider once a year for a well check visit.  Ask your health care provider how often you should have your eyes and teeth checked.  Stay up to date on all vaccines. This information is not intended to replace advice given to you  by your health care provider. Make sure you discuss any questions you have with your health care provider. Document Released: 06/23/2015 Document Revised: 05/21/2018 Document Reviewed: 05/21/2018 Elsevier Patient Education  [employer].     If you have lab work done today you will be contacted with your lab results within the next 2 weeks.  If you have not heard from [place] then please contact us. The fastest way to get your results is to register for My Chart.   IF you received an x-ray today, you will receive an invoice from [facility]. Please contact [facility] at [phone] with questions or concerns regarding your invoice.   IF you received labwork today, you will receive an invoice from [place]. Please contact [facility] at [phone] with questions or concerns regarding your invoice.   Our billing staff will not be able to assist you with questions regarding bills from these companies.  You will be contacted with the lab results as soon as they are available. The fastest way to get your results is to activate your My Chart  account. Instructions are located on the last page of this paperwork. If you have not heard from [place] regarding the results in 2 weeks, please contact this office.

## 2019-05-10 NOTE — Progress Notes (Signed)
Subjective:  Patient ID: Darlene Beasley, female    DOB: [birth year]  Age: 63 y.o. MRN: [id number]  CC:  Chief Complaint  Patient presents with  . Annual Exam    medicare wellness without pap. LAst pap was 09/2017. No other issues to discuss at this time    HPI Darlene Beasley presents for   Annual wellness exam.  Welcome to Medicare exam.  Care team: Oncology [name] Radiation oncology:  Dr. Lisbeth Renshaw General surgeon, Dr. Dalbert Batman Plastic surgeon [name]   Breast cancer: Bilateral breast cancer.  Stage IIIa on right, 1A on left invasive ductal carcinoma.  ER positive.  Imaged April 2019 with diagnosis Oct 08, 2017. Treated with neoadjuvant chemotherapy, right mastectomy, left lumpectomy, adjuvant radiation therapy and antiestrogen therapy with anastrozole beginning in May of this year. appt planned in March next year.   Hypertension: Elevated readings when seen at oncology, previously tried low-dose amlodipine but stopped due to minimal effect.  On medication. Home readings: elevated at times. Plans on restarting diet - BP controlled in past when on her diet.   BP Readings from Last 3 Encounters:  05/10/19 136/82  03/22/19 (!) 180/96  12/22/18 (!) 156/96   Lab Results  Component Value Date   CREATININE 0.86 03/22/2019   Cancer screening Colonoscopy: no FH of colon CA known. Reports negative cologuard last year.  History of breast cancer as above, last mammogram July 15 with posttreatment changes, no evidence of malignancy. Bone density testing February 7, osteopenia with T score of -2.1. Cervical cancer screening, Pap April 2019 -negative, HPV not detected.   There is no immunization history on file for this patient. Flu: declines all vaccines.  Pneumonia: Shingles:  Fall Risk  05/10/2019 12/07/2018 10/14/2017 09/24/2017 08/19/2017  Falls in the past year? 1 1 No No No  Comment 02/2019 - - - -  Number falls in past yr: 0 1 - - -  Injury with Fall? 1 1 - - -   Comment just bruised up- Had an MRI done - - - -  Follow up Falls evaluation completed - - - -  Loose rugs Lighting at home Assistive devices in bathroom Depression screen [facility] 2/9 05/10/2019 10/14/2017 09/24/2017 08/19/2017 04/05/2015  Decreased Interest 0 0 0 0 0  Down, Depressed, Hopeless 0 0 0 0 0  PHQ - 2 Score 0 0 0 0 0   Functional Status Survey: Is the patient deaf or have difficulty hearing?: Yes(a little) Does the patient have difficulty seeing, even when wearing glasses/contacts?: No Does the patient have difficulty concentrating, remembering, or making decisions?: No Does the patient have difficulty walking or climbing stairs?: No Does the patient have difficulty dressing or bathing?: No Does the patient have difficulty doing errands alone such as visiting a doctor's office or shopping?: No Hearing: minimal when increased noise. No prior testing. Discussed testing - declined.    Hearing Screening   125Hz  250Hz  500Hz  1000Hz  2000Hz  3000Hz  4000Hz  6000Hz  8000Hz   Right ear:           Left ear:             Visual Acuity Screening   Right eye Left eye Both eyes  Without correction:     With correction: 20/40 20/30 20/20   few years since eval.   6CIT Screen 05/10/2019  What Year? 0 points  What month? 0 points  What time? 0 points  Count back from 20 0 points  Months in reverse 0 points  Repeat phrase 0 points  Total Score 0      Office Visit from 05/10/2019 in [facility] at [facility]  AUDIT-C Score  4    Initial score 4, final score also 4, no concerning findings on subsequent questions.  Dental: has dentist, needs to schedule.   Exercise: active at work, no other regimen.  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   Advanced directives:  Discussed, paperwork declined.  Screening EKG, no previous EKG noted in chart, but reports having done prior to surgery last year - told had strong heart.   Hyperlipidemia: Not on medications, mild elevation in April  2019. Lab Results  Component Value Date   CHOL 190 09/10/2017   HDL 61 09/10/2017   LDLCALC 117 (H) 09/10/2017   TRIG 61 09/10/2017   CHOLHDL 3.1 09/10/2017   Lab Results  Component Value Date   ALT 12 03/22/2019   AST 14 (L) 03/22/2019   ALKPHOS 71 03/22/2019   BILITOT 0.5 03/22/2019  The 10-year ASCVD risk score Mikey Bussing DC Jr., et al., 2013) is: 10.6%   Values used to calculate the score:     Age: 84 years     Sex: Female     Is Non-Hispanic African American: No     Diabetic: No     Tobacco smoker: Yes     Systolic Blood Pressure: [id number] mmHg     Is BP treated: No     HDL Cholesterol: 61 mg/dL     Total Cholesterol: 190 mg/dL  Fasting today.     Hyperglycemia: Glucose 105 on 03/22/19.    History Patient Active Problem List   Diagnosis Date Noted  . Genetic testing 11/14/2017  . 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 Adopted   . Port-A-Cath in place 10/22/2017  . Cancer of overlapping sites of right female breast ([place]) 10/08/2017  . Malignant neoplasm of left breast in female, estrogen receptor positive ([place]) 10/08/2017   Past Medical History:  Diagnosis Date  . Adopted   . Arthritis   . breast ca dx'd 08/2017  . Genetic testing 11/14/2017   Multi-Cancer panel (83 genes) @ [facility] - Pathogenic mutation in the MITF gene  . GERD (gastroesophageal reflux disease)   . 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 Personal history of chemotherapy   . Personal history of radiation therapy    Past Surgical History:  Procedure Laterality Date  . BREAST LUMPECTOMY Left 04/03/2018  . BREAST LUMPECTOMY WITH RADIOACTIVE SEED AND SENTINEL LYMPH NODE BIOPSY Left 04/03/2018   Procedure: LEFT BREAST LUMPECTOMY WITH RADIOACTIVE SEED AND LEFT SENTINEL LYMPH NODE BIOPSY;  Surgeon: [name], MD;  Location: [place];  Service: General;  Laterality: Left;  . IR IMAGING GUIDED PORT INSERTION   10/20/2017  . IR US GUIDE VASC ACCESS LEFT  10/20/2017  . MASTECTOMY Right 04/03/2018  . MASTECTOMY MODIFIED RADICAL Right 04/03/2018   Procedure: MASTECTOMY MODIFIED RADICAL;  Surgeon: [name], MD;  Location: [place];  Service: General;  Laterality: Right;  . PORT-A-CATH REMOVAL N/A 04/03/2018   Procedure: REMOVAL PORT-A-CATH;  Surgeon: [name], MD;  Location: [place];  Service: General;  Laterality: N/A;  . TONSILLECTOMY Bilateral   . TUBAL LIGATION     Allergies  Allergen Reactions  . Bee Venom    Prior to Admission medications   Medication Sig Start Date End Date Taking? Authorizing Provider  b complex vitamins tablet Take 1 tablet by mouth daily.   Yes [provider]  calcium-vitamin D (CALCIUM 500/D) 500-200 MG-UNIT tablet Take 1 tablet by mouth.   Yes [provider]  letrozole (FEMARA) 2.5 MG tablet Take 1 tablet (2.5 mg total) by mouth daily. 03/22/19  Yes [name], MD  Multiple Vitamin (MULTIVITAMIN) tablet Take 1 tablet by mouth daily.   Yes [provider]  Ubiquinol (UBQH) 100 MG CAPS Take by mouth.   Yes [provider]   Social History   Socioeconomic History  . Marital status: Significant Other    Spouse name: Not on file  . Number of children: 1  . Years of education: Not on file  . Highest education level: Not on file  Occupational History  . Not on file  Social Needs  . Financial resource strain: Not on file  . Food insecurity    Worry: Not on file    Inability: Not on file  . Transportation needs    Medical: Not on file    Non-medical: Not on file  Tobacco Use  . Smoking status: Current Every Day Smoker    Packs/day: 0.50    Years: 51.00    Pack years: 25.50    Types: Cigarettes  . Smokeless tobacco: Never Used  . Tobacco comment: Working on quitting.  Substance and Sexual Activity  . Alcohol use: No    Comment: stopped 08/2017   . Drug use: Yes    Types:  Marijuana    Comment: daily, since age 63   . Sexual activity: Not Currently    Birth control/protection: Sponge, Surgical  Lifestyle  . Physical activity    Days per week: 7 days    Minutes per session: 80 min  . Stress: Very much  Relationships  . Social Herbalist on phone: Never    Gets together: Never    Attends religious service: Never    Active member of club or organization: No    Attends meetings of clubs or organizations: Never    Relationship status: Living with partner  . Intimate partner violence    Fear of current or ex partner: No    Emotionally abused: No    Physically abused: No    Forced sexual activity: No  Other Topics Concern  . Not on file  Social History Narrative  . Not on file    Review of Systems 13 point review of systems per patient health survey noted.  Negative other than as indicated above or in HPI.   Objective:   Vitals:   05/10/19 0923  BP: 136/82  Pulse: 89  Temp: 97.6 F (36.4 C)  TempSrc: Oral  SpO2: 98%  Weight: 171 lb 6.4 oz (77.7 kg)     Physical Exam     Assessment & Plan:  Mirielle Eatherly is a 63 y.o. female . Welcome to Medicare preventive visit  - - anticipatory guidance as below in AVS, screening labs if needed. Health maintenance items as above in HPI discussed/recommended as applicable.  - no concerning responses on depression, fall, or functional status screening. Any positive responses noted as above. Advanced directives discussed as in CHL.   -Declined audiology referral.  Declined recommend immunizations as above, CDC website provided for more information.  Recommended follow-up with optometrist, dentist.  Elevated blood pressure reading  -Borderline but okay in office today.  Monitor at home with RTC precautions for elevated readings.  Hold on  new meds for now.  Hyperlipidemia, unspecified hyperlipidemia type - Plan: Lipid panel  -Repeat testing, borderline elevated 10-year ASCVD risk score  based on prior labs.  Plans on changing diet, if still borderline could recheck in 6 months after dietary changes.  Bilateral malignant neoplasm of breast in female, estrogen receptor positive, unspecified site of breast ([place])  -Has continued follow-up with oncology.  Doing well.  Hyperglycemia - Plan: Hemoglobin A1c  -Mild elevation on recent labs, may not have been fasting that day.  Check A1c to screen for diabetes.  No orders of the defined types were placed in this encounter.  Patient Instructions       If you have lab work done today you will be contacted with your lab results within the next 2 weeks.  If you have not heard from [place] then please contact us. The fastest way to get your results is to register for My Chart.   IF you received an x-ray today, you will receive an invoice from [facility]. Please contact [facility]iology at [phone] with questions or concerns regarding your invoice.   IF you received labwork today, you will receive an invoice from [place]. Please contact [facility] at [phone] with questions or concerns regarding your invoice.   Our billing staff will not be able to assist you with questions regarding bills from these companies.  You will be contacted with the lab results as soon as they are available. The fastest way to get your results is to activate your My Chart account. Instructions are located on the last page of this paperwork. If you have not heard from [place] regarding the results in 2 weeks, please contact this office.          Signed, [name], MD [facility]

## 2019-05-11 LAB — LIPID PANEL
Chol/HDL Ratio: 2.9 ratio (ref 0.0–4.4)
Cholesterol, Total: 215 mg/dL — ABNORMAL HIGH (ref 100–199)
HDL: 75 mg/dL (ref >39)
LDL Chol Calc (NIH): 124 mg/dL — ABNORMAL HIGH (ref 0–99)
Triglycerides: 93 mg/dL (ref 0–149)
VLDL Cholesterol Cal: 16 mg/dL (ref 5–40)

## 2019-05-11 LAB — HEMOGLOBIN A1C
Est. average glucose Bld gHb Est-mCnc: 111 mg/dL
Hgb A1c MFr Bld: 5.5 % (ref 4.8–5.6)

## 2019-05-27 ENCOUNTER — Encounter: Payer: Self-pay | Admitting: Radiology

## 2019-06-24 ENCOUNTER — Other Ambulatory Visit: Payer: Self-pay | Admitting: *Deleted

## 2019-06-24 DIAGNOSIS — C50811 Malignant neoplasm of overlapping sites of right female breast: Secondary | ICD-10-CM

## 2019-06-24 MED ORDER — LETROZOLE 2.5 MG PO TABS: 2.5000 mg | ORAL_TABLET | Freq: Every day | ORAL | 5 refills | Status: DC

## 2019-07-08 ENCOUNTER — Other Ambulatory Visit: Payer: Self-pay

## 2019-07-08 DIAGNOSIS — C50811 Malignant neoplasm of overlapping sites of right female breast: Secondary | ICD-10-CM

## 2019-07-08 MED ORDER — LETROZOLE 2.5 MG PO TABS: 2.5000 mg | ORAL_TABLET | Freq: Every day | ORAL | 5 refills | Status: DC

## 2019-08-04 ENCOUNTER — Other Ambulatory Visit: Payer: Self-pay

## 2019-08-04 ENCOUNTER — Encounter: Payer: Self-pay | Admitting: Family Medicine

## 2019-08-04 ENCOUNTER — Ambulatory Visit (INDEPENDENT_AMBULATORY_CARE_PROVIDER_SITE_OTHER): Payer: Medicare HMO | Admitting: Family Medicine

## 2019-08-04 VITALS — BP 162/80 | HR 88 | Temp 98.1°F | Resp 15 | Ht 64.0 in | Wt 166.7 lb

## 2019-08-04 DIAGNOSIS — I10 Essential (primary) hypertension: Secondary | ICD-10-CM

## 2019-08-04 MED ORDER — AMLODIPINE BESYLATE 2.5 MG PO TABS: 2.5000 mg | ORAL_TABLET | Freq: Every day | ORAL | 1 refills | Status: DC

## 2019-08-04 NOTE — Progress Notes (Signed)
Subjective:  Patient ID: Darlene Beasley, female    DOB: 21-Dec-1955  Age: 64 y.o. MRN: [id number]  CC:  Chief Complaint  Patient presents with  . Hypertension    3 month recheck, pt states she restarted her diet like she had discussed but states she has not been as strict as she could be, pt states she takes BP at home and she averages about 160/80 similar to today.    HPI Darlene Beasley presents for   Hypertension: Last appointment 05/10/2019.  Elevated readings when seen at oncology, previously had taken low-dose amlodipine but stopped due to minimal effect.  Planned on diet changes at last visit.  Borderline reading at that time.  No new medication was started. Home readings: 160/80 at home.  No side effects with amlodipine in past.  Made some diet changes.  BP Readings from Last 3 Encounters:  08/04/19 (!) 162/80  05/10/19 136/82  03/22/19 (!) 180/96   Lab Results  Component Value Date   CREATININE 0.86 03/22/2019   Hyperlipidemia: ASCVD risk score 10% in November.  Plan on dietary changes initially with repeat testing in 6 months. xa ju   Lab Results  Component Value Date   CHOL 215 (H) 05/10/2019   HDL 75 05/10/2019   LDLCALC 124 (H) 05/10/2019   TRIG 93 05/10/2019   CHOLHDL 2.9 05/10/2019   Lab Results  Component Value Date   ALT 12 03/22/2019   AST 14 (L) 03/22/2019   ALKPHOS 71 03/22/2019   BILITOT 0.5 03/22/2019      History Patient Active Problem List   Diagnosis Date Noted  . Genetic testing 11/14/2017  . 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 Adopted   . Port-A-Cath in place 10/22/2017  . Cancer of overlapping sites of right female breast ([place]) 10/08/2017  . Malignant neoplasm of left breast in female, estrogen receptor positive ([place]) 10/08/2017   Past Medical History:  Diagnosis Date  . Adopted   . Arthritis   . breast ca dx'd 08/2017  . Genetic testing 11/14/2017   Multi-Cancer panel (83 genes) @ [facility] - Pathogenic mutation  in the MITF gene  . GERD (gastroesophageal reflux disease)   . 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 Personal history of chemotherapy   . Personal history of radiation therapy    Past Surgical History:  Procedure Laterality Date  . BREAST LUMPECTOMY Left 04/03/2018  . BREAST LUMPECTOMY WITH RADIOACTIVE SEED AND SENTINEL LYMPH NODE BIOPSY Left 04/03/2018   Procedure: LEFT BREAST LUMPECTOMY WITH RADIOACTIVE SEED AND LEFT SENTINEL LYMPH NODE BIOPSY;  Surgeon: [name], MD;  Location: [facility];  Service: General;  Laterality: Left;  . IR IMAGING GUIDED PORT INSERTION  10/20/2017  . IR US GUIDE VASC ACCESS LEFT  10/20/2017  . MASTECTOMY Right 04/03/2018  . MASTECTOMY MODIFIED RADICAL Right 04/03/2018   Procedure: MASTECTOMY MODIFIED RADICAL;  Surgeon: [name], MD;  Location: [facility];  Service: General;  Laterality: Right;  . PORT-A-CATH REMOVAL N/A 04/03/2018   Procedure: REMOVAL PORT-A-CATH;  Surgeon: [name], MD;  Location: [facility];  Service: General;  Laterality: N/A;  . TONSILLECTOMY Bilateral   . TUBAL LIGATION     Allergies  Allergen Reactions  . Bee Venom    Prior to Admission medications   Medication Sig Start Date End Date Taking? Authorizing Provider  b complex vitamins tablet Take 1 tablet by  mouth daily.   Yes [provider]  calcium-vitamin D (CALCIUM 500/D) 500-200 MG-UNIT tablet Take 1 tablet by mouth.   Yes [provider]  letrozole (FEMARA) 2.5 MG tablet Take 1 tablet (2.5 mg total) by mouth daily. 07/08/19  Yes [name], MD  Ubiquinol (UBQH) 100 MG CAPS Take by mouth.   Yes [provider]   Social History   Socioeconomic History  . Marital status: Significant Other    Spouse name: Not on file  . Number of children: 1  . Years of education: Not on file  . Highest education level: Not on file    Occupational History  . Not on file  Tobacco Use  . Smoking status: Current Every Day Smoker    Packs/day: 0.50    Years: 51.00    Pack years: 25.50    Types: Cigarettes  . Smokeless tobacco: Never Used  . Tobacco comment: Working on quitting.  Substance and Sexual Activity  . Alcohol use: No    Comment: stopped 08/2017   . Drug use: Yes    Types: Marijuana    Comment: daily, since age 64   . Sexual activity: Not Currently    Birth control/protection: Sponge, Surgical  Other Topics Concern  . Not on file  Social History Narrative  . Not on file   Social Determinants of Health   Financial Resource Strain:   . Difficulty of Paying Living Expenses: Not on file  Food Insecurity:   . Worried About Charity fundraiser in the Last Year: Not on file  . Ran Out of Food in the Last Year: Not on file  Transportation Needs:   . Lack of Transportation (Medical): Not on file  . Lack of Transportation (Non-Medical): Not on file  Physical Activity:   . Days of Exercise per Week: Not on file  . Minutes of Exercise per Session: Not on file  Stress:   . Feeling of Stress : Not on file  Social Connections:   . Frequency of Communication with Friends and Family: Not on file  . Frequency of Social Gatherings with Friends and Family: Not on file  . Attends Religious Services: Not on file  . Active Member of Clubs or Organizations: Not on file  . Attends Archivist Meetings: Not on file  . Marital Status: Not on file  Intimate Partner Violence:   . Fear of Current or Ex-Partner: Not on file  . Emotionally Abused: Not on file  . Physically Abused: Not on file  . Sexually Abused: Not on file    Review of Systems  Constitutional: Negative for fatigue and unexpected weight change.  Respiratory: Negative for chest tightness and shortness of breath.   Cardiovascular: Negative for chest pain, palpitations and leg swelling.  Gastrointestinal: Negative for abdominal pain and blood  in stool.  Neurological: Negative for dizziness, syncope, light-headedness and headaches.     Objective:   Vitals:   08/04/19 1532 08/04/19 1536  BP: (!) 165/84 (!) 162/80  Pulse: 88   Resp: 15   Temp: 98.1 F (36.7 C)   TempSrc: Temporal   SpO2: 98%   Weight: 166 lb 11.2 oz (75.6 kg)   Height: 5\' 4"  (1.626 m)      Physical Exam Vitals reviewed.  Constitutional:      Appearance: She is well-developed.  HENT:     Head: Normocephalic and atraumatic.  Eyes:     Conjunctiva/sclera: Conjunctivae normal.     Pupils:  Pupils are equal, round, and reactive to light.  Neck:     Vascular: No carotid bruit.  Cardiovascular:     Rate and Rhythm: Normal rate and regular rhythm.     Heart sounds: Normal heart sounds.  Pulmonary:     Effort: Pulmonary effort is normal.     Breath sounds: Normal breath sounds.  Abdominal:     Palpations: Abdomen is soft. There is no pulsatile mass.     Tenderness: There is no abdominal tenderness.  Skin:    General: Skin is warm and dry.  Neurological:     Mental Status: She is alert and oriented to person, place, and time.  Psychiatric:        Behavior: Behavior normal.      Assessment & Plan:  Darlene Beasley is a 64 y.o. female . Essential hypertension - Plan: amLODipine (NORVASC) 2.5 MG tablet  -Persistent elevations.  Will try adding low-dose amlodipine.  Potential side effects and risks discussed, continue home monitoring, recheck 3 months.  After end of visit asked about treatments for arthritis pain.  Intermittent Tylenol, glucosamine conjoint over-the-counter okay for now but recommended office visit to evaluate any specific area of persistent discomfort.  Meds ordered this encounter  Medications  . amLODipine (NORVASC) 2.5 MG tablet    Sig: Take 1 tablet (2.5 mg total) by mouth daily.    Dispense:  90 tablet    Refill:  1   Patient Instructions   Start amlodipine once per day.  Recheck in 3 months.  Return to the clinic  or go to the nearest emergency room if any of your symptoms worsen or new symptoms occur.  If you have lab work done today you will be contacted with your lab results within the next 2 weeks.  If you have not heard from [place] then please contact us. The fastest way to get your results is to register for My Chart.   IF you received an x-ray today, you will receive an invoice from [facility]. Please contact [facility] at [phone] with questions or concerns regarding your invoice.   IF you received labwork today, you will receive an invoice from [facility]. Please contact [facility] at [phone] with questions or concerns regarding your invoice.   Our billing staff will not be able to assist you with questions regarding bills from these companies.  You will be contacted with the lab results as soon as they are available. The fastest way to get your results is to activate your My Chart account. Instructions are located on the last page of this paperwork. If you have not heard from [place] regarding the results in 2 weeks, please contact this office.         Signed, [name], MD [facility]

## 2019-08-04 NOTE — Patient Instructions (Addendum)
Start amlodipine once per day.  Recheck in 3 months.  Return to the clinic or go to the nearest emergency room if any of your symptoms worsen or new symptoms occur.  If you have lab work done today you will be contacted with your lab results within the next 2 weeks.  If you have not heard from [place] then please contact us. The fastest way to get your results is to register for My Chart.   IF you received an x-ray today, you will receive an invoice from [facility]. Please contact [facility] at [phone] with questions or concerns regarding your invoice.   IF you received labwork today, you will receive an invoice from [place] [facility]. Please contact [facility] at [phone] with questions or concerns regarding your invoice.   Our billing staff will not be able to assist you with questions regarding bills from these companies.  You will be contacted with the lab results as soon as they are available. The fastest way to get your results is to activate your My Chart account. Instructions are located on the last page of this paperwork. If you have not heard from [place] regarding the results in 2 weeks, please contact this office.

## 2019-08-19 ENCOUNTER — Telehealth: Payer: Self-pay

## 2019-08-19 ENCOUNTER — Inpatient Hospital Stay (HOSPITAL_BASED_OUTPATIENT_CLINIC_OR_DEPARTMENT_OTHER): Payer: Medicaid Other | Admitting: Nurse Practitioner

## 2019-08-19 ENCOUNTER — Encounter: Payer: Self-pay | Admitting: Nurse Practitioner

## 2019-08-19 ENCOUNTER — Inpatient Hospital Stay: Payer: Medicaid Other | Attending: Nurse Practitioner

## 2019-08-19 ENCOUNTER — Other Ambulatory Visit: Payer: Self-pay

## 2019-08-19 VITALS — BP 153/95 | HR 84 | Temp 98.5°F | Resp 18 | Ht 64.0 in | Wt 165.9 lb

## 2019-08-19 DIAGNOSIS — I10 Essential (primary) hypertension: Secondary | ICD-10-CM | POA: Diagnosis not present

## 2019-08-19 DIAGNOSIS — C50412 Malignant neoplasm of upper-outer quadrant of left female breast: Secondary | ICD-10-CM | POA: Insufficient documentation

## 2019-08-19 DIAGNOSIS — Z79899 Other long term (current) drug therapy: Secondary | ICD-10-CM | POA: Diagnosis not present

## 2019-08-19 DIAGNOSIS — G620 Drug-induced polyneuropathy: Secondary | ICD-10-CM | POA: Diagnosis not present

## 2019-08-19 DIAGNOSIS — I700 Atherosclerosis of aorta: Secondary | ICD-10-CM | POA: Insufficient documentation

## 2019-08-19 DIAGNOSIS — M8580 Other specified disorders of bone density and structure, unspecified site: Secondary | ICD-10-CM | POA: Diagnosis not present

## 2019-08-19 DIAGNOSIS — Z170 Estrogen receptor positive status [ER+]: Secondary | ICD-10-CM

## 2019-08-19 DIAGNOSIS — F17210 Nicotine dependence, cigarettes, uncomplicated: Secondary | ICD-10-CM | POA: Insufficient documentation

## 2019-08-19 DIAGNOSIS — Z923 Personal history of irradiation: Secondary | ICD-10-CM | POA: Insufficient documentation

## 2019-08-19 DIAGNOSIS — C50811 Malignant neoplasm of overlapping sites of right female breast: Secondary | ICD-10-CM

## 2019-08-19 DIAGNOSIS — Z79811 Long term (current) use of aromatase inhibitors: Secondary | ICD-10-CM | POA: Diagnosis not present

## 2019-08-19 DIAGNOSIS — C773 Secondary and unspecified malignant neoplasm of axilla and upper limb lymph nodes: Secondary | ICD-10-CM | POA: Diagnosis not present

## 2019-08-19 LAB — CMP (CANCER CENTER ONLY)
ALT: 15 U/L (ref 0–44)
AST: 15 U/L (ref 15–41)
Albumin: 3.7 g/dL (ref 3.5–5.0)
Alkaline Phosphatase: 68 U/L (ref 38–126)
Anion gap: 7 (ref 5–15)
BUN: 25 mg/dL — ABNORMAL HIGH (ref 8–23)
CO2: 29 mmol/L (ref 22–32)
Calcium: 9.1 mg/dL (ref 8.9–10.3)
Chloride: 107 mmol/L (ref 98–111)
Creatinine: 0.86 mg/dL (ref 0.44–1.00)
GFR, Est AFR Am: >60 mL/min (ref >60)
GFR, Estimated: >60 mL/min (ref >60)
Glucose, Bld: 93 mg/dL (ref 70–99)
Potassium: 4.5 mmol/L (ref 3.5–5.1)
Sodium: 143 mmol/L (ref 135–145)
Total Bilirubin: 0.5 mg/dL (ref 0.3–1.2)
Total Protein: 6.4 g/dL — ABNORMAL LOW (ref 6.5–8.1)

## 2019-08-19 LAB — CBC WITH DIFFERENTIAL (CANCER CENTER ONLY)
Abs Immature Granulocytes: 0.02 10*3/uL (ref 0.00–0.07)
Basophils Absolute: 0.1 10*3/uL (ref 0.0–0.1)
Basophils Relative: 1 %
Eosinophils Absolute: 0.2 10*3/uL (ref 0.0–0.5)
Eosinophils Relative: 2 %
HCT: 42.7 % (ref 36.0–46.0)
Hemoglobin: 14.3 g/dL (ref 12.0–15.0)
Immature Granulocytes: 0 %
Lymphocytes Relative: 19 %
Lymphs Abs: 1.5 10*3/uL (ref 0.7–4.0)
MCH: 31.7 pg (ref 26.0–34.0)
MCHC: 33.5 g/dL (ref 30.0–36.0)
MCV: 94.7 fL (ref 80.0–100.0)
Monocytes Absolute: 0.4 10*3/uL (ref 0.1–1.0)
Monocytes Relative: 5 %
Neutro Abs: 5.9 10*3/uL (ref 1.7–7.7)
Neutrophils Relative %: 73 %
Platelet Count: 220 10*3/uL (ref 150–400)
RBC: 4.51 MIL/uL (ref 3.87–5.11)
RDW: 13.3 % (ref 11.5–15.5)
WBC Count: 8.1 10*3/uL (ref 4.0–10.5)
nRBC: 0 % (ref 0.0–0.2)

## 2019-08-19 NOTE — Telephone Encounter (Signed)
Ms Cease returned my call.  She denies sore throat, fever, and cough.  I told her we will keep her appointment.

## 2019-08-19 NOTE — Telephone Encounter (Signed)
Ms. Gamby left vm stating she had swollen glands in her neck and was not sure if she should reschedule he appts  I attempted to contact Ms Tassone.  I was unable to leave a vm as her mailbox has not been set up.

## 2019-08-19 NOTE — Progress Notes (Signed)
[place]   [phone] [phone]   Clinic Follow up Note   Patient Care Team: [name], MD as PCP - General (Family Medicine) [name], MD as Consulting Physician (General Surgery) [name], MD as Consulting Physician (Hematology) Kyung Rudd, MD as Consulting Physician (Radiation Oncology) Alla Feeling, NP as Nurse Practitioner (Nurse Practitioner) 08/19/2019  CHIEF COMPLAINT: F/u bilateral breast cancer   SUMMARY OF ONCOLOGIC HISTORY: Oncology History Overview Note  Cancer Staging Cancer of overlapping sites of right female breast [facility]) Staging form: Breast, AJCC 8th Edition - Clinical stage from 09/23/2017: Stage IIIA (cT3, cN1, cM0, G2, ER+, PR-, HER2-) - Signed by [name], MD on 10/09/2017 - Pathologic stage from 04/03/2018: No Stage Recommended (ypT3, pN2a, cM0, G3, ER+, PR-, HER2-) - Signed by [name], MD on 05/03/2018  Malignant neoplasm of left breast in female, estrogen receptor positive ([place]) Staging form: Breast, AJCC 8th Edition - Clinical: Stage IA (cT1b, cN0, cM0, G1, ER+, PR+, HER2-) - Unsigned - Pathologic stage from 04/03/2018: No Stage Recommended (ypT1b, pN0, cM0, G1, ER+, PR+, HER2-) - Signed by [name], MD on 05/03/2018     Cancer of overlapping sites of right female breast ([place])  09/09/2017 Mammogram   IMPRESSION: 1. Highly suspicious large right breast mass involving the outer and upper breast extending from approximately 9 o'clock through 12-1 o'clock, maximum measurement approximating 7.5 cm. The mass involves the right nipple, accounting for nipple retraction. Architectural distortion, microcalcifications and diffuse trabecular thickening and skin thickening are associated with the large mass, raising the possibility of this representing an inflammatory cancer. 2. Satellite masses separate from the dominant mass involving the lower outer quadrant and the upper inner quadrant of the right breast,  measured above. 3. Two adjacent pathologic right axillary lymph nodes. 4. Indeterminate 0.9 cm solid mass involving the upper outer quadrant of the left breast which accounts for a mammographic finding. 5. No pathologic left axillary lymphadenopathy.   09/09/2017 Breast US   Targeted right breast ultrasound is performed, showing a very large hypoechoic mass with irregular margins extending from the approximate 9 o'clock position through the 12 to 1 o'clock position. On the CC gait image, the mass measures maximally approximately 7.5 cm. The mass has irregular margins, demonstrates acoustic shadowing, and demonstrates internal power Doppler flow. The mass extends into the retroareolar region and involves the nipple, accounting for the nipple retraction.  Separate from the dominant mass at the 7 o'clock position approximately 4 cm from nipple is a hypoechoic antiparallel mass with irregular margins measuring approximately 1.4 x 2.7 x 1.7 cm.  Separate from the dominant mass at the 1 o'clock position approximately 2 cm from nipple is a hypoechoic mass with irregular margins measuring approximately 1.7 x 0.5 x 1.6 cm. Both of these masses also demonstrate internal power Doppler flow.  Sonographic evaluation of the right axilla demonstrates 2 adjacent pathologic lymph nodes, the larger measuring approximately 2.5 x 2.5 x 2.8 cm, the smaller measuring approximately 2.0 x 0.8 x 2.7 cm.   09/23/2017 Initial Biopsy   Diagnosis 1. Breast, left, needle core biopsy, 2 o'clock - INVASIVE DUCTAL CARCINOMA. - SEE COMMENT. 2. Breast, right, needle core biopsy, 11:30 o'clock - INVASIVE MAMMARY CARCINOMA. - SEE COMMENT. 3. Lymph node, needle/core biopsy, right axilla - METASTATIC MAMMARY CARCINOMA IN 1 OF 1 LYMPH NODE (1/1). Microscopic Comment 1. There is a small focus of grade I invasive ductal carcinoma. A complete breast prognostic profile will be attempted  and the results reported separately. The results  were called to the [facility] on 09/24/2017. 2. The carcinoma appears grade II. An E-Cadherin stain and a breast prognostic profile will be performed on part 2 and the results reported separately. (JBK:kh 09-24-17) [name]  1. HER2 Negative by FISH Estrogen receptor: 100% positive, strong staining Progesterone receptor: 80% positive, strong staining Ki67: 5%  2. HER2 Negative by FISH Estrogen receptor: 100% positive, strong staining Progesterone receptor: 0%, negative  Ki67: 30% 2. The tumor cells are strongly positive for E-cadherin, supporting a ductal phenotype   09/23/2017 Cancer Staging   Staging form: Breast, AJCC 8th Edition - Clinical stage from 09/23/2017: Stage IIIA (cT3, cN1, cM0, G2, ER+, PR-, HER2-) - Signed by [name], MD on 10/09/2017   10/07/2017 Breast MRI   IMPRESSION: 1. Biopsy proven malignancy involving all 4 quadrants of the right breast, although predominantly located in the anterior third of the central upper right breast measures 6.8 x 9.6 x 7.2 cm.  2. Five pathologic right axillary lymph nodes, compatible with biopsy proven axillary metastases.  3. Small mass with associated biopsy related changes in the upper-outer left breast at site of known malignancy measures 0.9 x 0.8 x 0.7 cm. No abnormal lymph nodes seen in the left axilla.  RECOMMENDATION: Treatment plan for known bilateral breast malignancy.  BI-RADS CATEGORY  6: Known biopsy-proven malignancy.   10/08/2017 Initial Diagnosis   Cancer of overlapping sites of right female breast ([place])   10/13/2017 Pathology Results   Diagnosis 1. Breast, right, needle core biopsy, 1 o'clock - INVASIVE DUCTAL CARCINOMA. - SEE COMMENT. 2. Breast, right, needle core biopsy, 7 o'clock - INVASIVE DUCTAL CARCINOMA. - SEE COMMENT. Microscopic Comment 1. and 2. The carcinoma in parts 1 and 2 is morphologically similar and appears grade II. Because breast prognostic profiles were performed on the  patients previous case, [id number], it will not be repeated on the current case unless requested.    10/17/2017 Imaging   CT CAP IMPRESSION: 1. Infiltrative right breast mass with overlying skin thickening is identified compatible with known breast cancer. 2. Enlarged right axillary lymph nodes compatible with metastatic adenopathy. 3. No additional sites of disease identified within the chest and no evidence for metastasis to the abdomen or pelvis. 4. Aortic atherosclerosis and 3 vessel coronary artery atherosclerotic calcifications. Aortic Atherosclerosis (ICD10-I70.0).   10/17/2017 Imaging   Bone Scan FINDINGS: There are no foci of increased or decreased radiotracer uptake to suggest osseous metastatic disease. 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 There is degenerative type uptake within both shoulders and the left ankle.  Normal physiologic activity is identified within the kidneys and urinary bladder.  IMPRESSION: No evidence of osseous metastatic disease.   10/22/2017 -  Chemotherapy   AC q2 weeks x4 cycles starting 10/22/17-12/03/17, followed by weekly taxol x12 12/17/17-03/11/18   11/14/2017 Genetic Testing   The genes that were analyzed were the 83 genes on [facility]'s Multi-Cancer panel (ALK, APC, ATM, AXIN2, BAP1, BARD1, BLM, BMPR1A, BRCA1, BRCA2, BRIP1, CASR, CDC73, CDH1, CDK4, CDKN1B, CDKN1C, CDKN2A, CEBPA, CHEK2, CTNNA1, DICER1, DIS3L2, EGFR, EPCAM, FH, FLCN, GATA2, GPC3, GREM1, HOXB13, HRAS, KIT, MAX, MEN1, MET, MITF, MLH1, MSH2, MSH3, MSH6, MUTYH, NBN, NF1, NF2, NTHL1, PALB2, PDGFRA, PHOX2B, PMS2, POLD1, POLE, POT1, PRKAR1A, PTCH1, PTEN, RAD50, RAD51C, RAD51D, RB1, RECQL4, RET, RUNX1, SDHA, SDHAF2, SDHB, SDHC, SDHD, SMAD4, SMARCA4, SMARCB1, SMARCE1, STK11, SUFU, TERC, TERT,  TMEM127, TP53, TSC1, TSC2, VHL, WRN, WT1).  Testingrevealed a pathogenic mutation in the MITF  genecalled c.952G>A (p.Glu318Lys) and A Variant of Unknown Significance (VUS) was also detected: POLD1 c.301A>T (p.Ile101Phe).   12/16/2017 Breast US   IMPRESSION: 1. Stable to minimal decrease in size of multiple irregular masses throughout the right breast consistent with the patient's sites of biopsy proven malignancy. While the mammographic appearance is slightly less dense in this region, there is increased skin and trabecular thickening involving the entire right breast. 2. Stable appearance of morphologically abnormal right axillary lymph nodes, consistent with biopsy proven metastatic disease. 3. Stable appearance of a left breast mass, consistent with biopsy proven malignancy.    04/03/2018 Cancer Staging   Staging form: Breast, AJCC 8th Edition - Pathologic stage from 04/03/2018: No Stage Recommended (ypT3, pN2a, cM0, G3, ER+, PR-, HER2-) - Signed by [name], MD on 05/03/2018   10/19/2018 -  Anti-estrogen oral therapy   Adjuvant letrozole 2.'5mg'$  daily     Survivorship   Per Cira Rue, NP    Malignant neoplasm of left breast in female, estrogen receptor positive ([place])  09/09/2017 Breast US   Targeted left breast ultrasound is performed, showing an oval parallel hypoechoic mass containing microcalcifications at the 2 o'clock position approximately 6 cm from nipple measuring approximately 0.9 x 0.5 x 0.9 cm, corresponding to the mammographic finding.  Sonographic evaluation of the left axilla demonstrates no pathologic lymphadenopathy.   09/23/2017 Initial Biopsy   Diagnosis 1. Breast, left, needle core biopsy, 2 o'clock - INVASIVE DUCTAL CARCINOMA. - SEE COMMENT. 2. Breast, right, needle core biopsy, 11:30 o'clock - INVASIVE MAMMARY CARCINOMA. - SEE COMMENT. 3. Lymph node, needle/core biopsy, right axilla - METASTATIC MAMMARY CARCINOMA IN 1 OF 1 LYMPH NODE (1/1). Microscopic Comment 1. There is a small focus of grade I invasive ductal carcinoma. A complete breast  prognostic profile will be attempted and the results reported separately. The results were called to the [facility] on 09/24/2017. 2. The carcinoma appears grade II. An E-Cadherin stain and a breast prognostic profile will be performed on part 2 and the results reported separately. (JBK:kh 09-24-17) [name]  1. HER2 Negative by FISH Estrogen receptor: 100% positive, strong staining Progesterone receptor: 80% positive, strong staining Ki67: 5%  2. HER2 Negative by FISH Estrogen receptor: 100% positive, strong staining Progesterone receptor: 0%, negative  Ki67: 30%   10/08/2017 Initial Diagnosis   Malignant neoplasm of left breast in female, estrogen receptor positive ([place])   12/16/2017 Breast US   IMPRESSION: 3. Stable appearance of a left breast mass, consistent with biopsy proven malignancy.    01/22/2018 Imaging   01/22/2018 MRI Bilateral Breast IMPRESSION: 1. Dominant enhancing mass with surrounding nodularity involving the central anterior third of the right breast now measures up to 6.4 x 6.7 x 7.6 cm (previously measured 6.8 x 9.6 x 7.2 cm). Persistent bulky right axillary lymphadenopathy, slightly decreased in size since prior MRI.  2. Biopsy proven left breast malignancy now measures 0.5 cm (previously measured 0.9 x 0.8 x 0.7 cm).    04/03/2018 Pathology Results   Diagnosis 1. Breast, lumpectomy, left with radioactive seed - RESIDUAL INVASIVE DUCTAL CARCINOMA STATUS POST NEOADJUVANT THERAPY (0.9 CM) - MARGINS UNINVOLVED BY CARCINOMA (0.2 CM; INFERIOR MARGIN) - CALCIFICATIONS ASSOCIATED WITH CARCINOMA - PREVIOUS BIOPSY SITE CHANGES PRESENT - SEE ONCOLOGY TABLE AND COMMENT BELOW 2. Lymph node, sentinel, biopsy, left axillary #1 - NO CARCINOMA IDENTIFIED IN ONE LYMPH NODE (0/1) - SEE COMMENT  3. Lymph node, sentinel, biopsy, left axillary #2 - NO CARCINOMA IDENTIFIED IN ONE LYMPH NODE (0/1) - SEE COMMENT 4. Lymph node, sentinel, biopsy, left - NO  CARCINOMA IDENTIFIED IN ONE LYMPH NODE (0/1) - SEE COMMENT 5. Lymph node, sentinel, biopsy, left axillary #3 - NO CARCINOMA IDENTIFIED IN ONE LYMPH NODE (0/1) - SEE COMMENT 6. Breast, modified radical mastectomy , right - RESIDUAL INVASIVE DUCTAL CARCINOMA STATUS POST NEOADJUVANT THERAPY (8.5 CM) - EXTENSIVE LYMPHOVASCULAR SPACE INVASION PRESENT - METASTATIC CARCINOMA INVOLVING FIVE OF FIFTEEN LYMPH NODES WITH EXTRACAPSULAR EXTENSION (5/15) - MARGINS UNINVOLVED BY CARCINOMA - SEE ONCOLOGY TABLE AND COMMENT BELOW   04/03/2018 Receptors her2   The tumor cells are NEGATIVE for Her2 (1+). Estrogen Receptor: 95%, POSITIVE, STRONG STAINING INTENSITY Progesterone Receptor: 95%, POSITIVE, STRONG STAINING INTENSITY  The tumor cells are NEGATIVE for Her2 (1+). Estrogen Receptor: 100%, POSITIVE, STRONG STAINING INTENSITY Progesterone Receptor: 0%, NEGATIVE   04/03/2018 Cancer Staging   Staging form: Breast, AJCC 8th Edition - Pathologic stage from 04/03/2018: No Stage Recommended (ypT1b, pN0, cM0, G1, ER+, PR+, HER2-) - Signed by [name], MD on 05/03/2018   04/03/2018 Surgery   Left LEFT BREAST LUMPECTOMY WITH RADIOACTIVE SEED AND LEFT SENTINEL LYMPH NODE BIOPSY with [name], MD    05/20/2018 - 07/08/2018 Radiation Therapy   Daily adjuvant radiation treatment with Dr.Moody.    Radiation treatment dates:   05/20/2018 - 07/08/2018  Site/dose:   The patient initially received a dose of 50.4 Gy in 28 fractions to the right chest wall and supraclavicular region. This was delivered using a 3-D conformal, 4 field technique. The patient then received a boost to the mastectomy scar. This delivered an additional 10 Gy in 5 fractions using an en face electron field. The total dose was 60.4 Gy. At the same time, the patient received a dose of 50.4 Gy in 28 fractions to the left breast using whole-breast tangent fields. This was delivered using a 3-D conformal technique. The patient then  received a boost to the seroma. This delivered an additional 10 Gy in 5 fractions using 15E electrons with a special teletherapy technique. The total dose was 60.4 Gy.   10/19/2018 -  Anti-estrogen oral therapy   Letrozole 2.'5mg'$  daily started 07/2018     Survivorship   Per Cira Rue, NP      CURRENT THERAPY: Letrozole 2.5 mg daily, starting 10/19/2018   INTERVAL HISTORY: Darlene Beasley returns for f/u as scheduled. She was last seen in 03/2019. Continues letrozole. She has joint aches especially in cold weather. When it's warm out she is fine. Denies hot flashes. Denies new lump/mass in left breast or chest wall. Appetite is normal. She is fatigued but functional, still working. Denies bleeding or change in bowel habits. She had swollen glands in her neck. No sore throat or dental issue. Has some clear nasal drainage. No fever, chills, cough, chest pain, or dyspnea. Sunday she restarted amlodipine and felt disoriented with a headache until she could take a nap later on Monday, then resolved.    MEDICAL HISTORY:  Past Medical History:  Diagnosis Date  . Adopted   . Arthritis   . breast ca dx'd 08/2017  . Genetic testing 11/14/2017   Multi-Cancer panel (83 genes) @ [facility] - Pathogenic mutation in the MITF gene  . GERD (gastroesophageal reflux disease)   . 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 Personal history of chemotherapy   . Personal  history of radiation therapy     SURGICAL HISTORY: Past Surgical History:  Procedure Laterality Date  . BREAST LUMPECTOMY Left 04/03/2018  . BREAST LUMPECTOMY WITH RADIOACTIVE SEED AND SENTINEL LYMPH NODE BIOPSY Left 04/03/2018   Procedure: LEFT BREAST LUMPECTOMY WITH RADIOACTIVE SEED AND LEFT SENTINEL LYMPH NODE BIOPSY;  Surgeon: [name], MD;  Location: [place];  Service: General;  Laterality: Left;  . IR IMAGING GUIDED PORT INSERTION  10/20/2017  . IR US GUIDE  VASC ACCESS LEFT  10/20/2017  . MASTECTOMY Right 04/03/2018  . MASTECTOMY MODIFIED RADICAL Right 04/03/2018   Procedure: MASTECTOMY MODIFIED RADICAL;  Surgeon: [name], MD;  Location: [place];  Service: General;  Laterality: Right;  . PORT-A-CATH REMOVAL N/A 04/03/2018   Procedure: REMOVAL PORT-A-CATH;  Surgeon: [name], MD;  Location: [place];  Service: General;  Laterality: N/A;  . TONSILLECTOMY Bilateral   . TUBAL LIGATION      I have reviewed the social history and family history with the patient and they are unchanged from previous note.  ALLERGIES:  is allergic to bee venom.  MEDICATIONS:  Current Outpatient Medications  Medication Sig Dispense Refill  . amLODipine (NORVASC) 2.5 MG tablet Take 1 tablet (2.5 mg total) by mouth daily. 90 tablet 1  . b complex vitamins tablet Take 1 tablet by mouth daily.    . calcium-vitamin D (CALCIUM 500/D) 500-200 MG-UNIT tablet Take 1 tablet by mouth.    . letrozole (FEMARA) 2.5 MG tablet Take 1 tablet (2.5 mg total) by mouth daily. 30 tablet 5  . Ubiquinol (UBQH) 100 MG CAPS Take by mouth.     No current facility-administered medications for this visit.    PHYSICAL EXAMINATION: ECOG PERFORMANCE STATUS: 1 - Symptomatic but completely ambulatory  Vitals:   08/19/19 1451 08/19/19 1452  BP: (!) 171/102 (!) 153/95  Pulse: 84   Resp: 18   Temp: 98.5 F (36.9 C)   SpO2: 98%    Filed Weights   08/19/19 1451  Weight: 165 lb 14.4 oz (75.3 kg)    GENERAL:alert, no distress and comfortable SKIN: no rash  EYES: sclera clear NECK: without mass LYMPH:  no palpable cervical or supraclavicular lymphadenopathy LUNGS: clear with normal breathing effort HEART: regular rate & rhythm, no lower extremity edema NEURO: alert & oriented x 3 with fluent speech, normal gait Breast: s/p right mastectomy and left lumpectomy. No palpable nodularity, mass, or adenopathy that I could appreciate. Mild  lymphedema in left breast  LABORATORY DATA:  I have reviewed the data as listed CBC Latest Ref Rng & Units 08/19/2019 03/22/2019 12/22/2018  WBC 4.0 - 10.5 K/uL 8.1 6.3 5.9  Hemoglobin 12.0 - 15.0 g/dL 14.3 14.3 13.7  Hematocrit 36.0 - 46.0 % 42.7 42.8 41.3  Platelets 150 - 400 K/uL 220 219 215     CMP Latest Ref Rng & Units 08/19/2019 03/22/2019 12/22/2018  Glucose 70 - 99 mg/dL 93 105(H) 108(H)  BUN 8 - 23 mg/dL 25(H) 16 24(H)  Creatinine 0.44 - 1.00 mg/dL 0.86 0.86 1.07(H)  Sodium 135 - 145 mmol/L 143 140 141  Potassium 3.5 - 5.1 mmol/L 4.5 4.2 4.7  Chloride 98 - 111 mmol/L 107 105 106  CO2 22 - 32 mmol/L '29 28 26  '$ Calcium 8.9 - 10.3 mg/dL 9.1 9.2 8.8(L)  Total Protein 6.5 - 8.1 g/dL 6.4(L) 6.5 6.3(L)  Total Bilirubin 0.3 - 1.2 mg/dL 0.5 0.5 0.3  Alkaline Phos 38 - 126 U/L 68  71 99  AST 15 - 41 U/L 15 14(L) 12(L)  ALT 0 - 44 U/L '15 12 10      '$ RADIOGRAPHIC STUDIES: I have personally reviewed the radiological images as listed and agreed with the findings in the report. No results found.   ASSESSMENT & PLAN: Darlene Beasley is a 64 y.o. female with   1.Bilateral breast cancer: right breast, invasiveductalcarcinoma in right, grade II, stage IIIA (cT3N1M0), ER 100% positive, PR 0%, negative, HER2 negative by FISH, Ki67 30% metastatic to 5 axillary lymph nodes, ypT3N2a; left breast - invasive ductal carcinoma, grade I, stage IA (cT1bN0M0), ER 100% positive, PR 80% positive, HER2 negative, Ki67 5%, ypT1bN0 -Diagnosed on 09/2017. 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 -She is s/pneoadjuvant chemo withAC-T,underwentleft breastlumpectomyand right mastectomy.She had a very limited response to neoadjuvant chemotherapy.She also completed adjuvant radiation.  -Shestarted antiestrogen therapy with [facility] 10/2018.  -mammogram in 12/2018, negative  -[name] previously discussed the option of clinical trials including the [facility] trail and [facility] trail with oral  [place]ali for locally advanced breast cancer. She declined  -continue surveillance and AI  2.Smokingcessation  -She has a long history of smoking,>40 years -She smokes 1/2 PPD now   3.HTN -restarted amlodipine recently per PCP -BP 153/95 today   4.Peripheral neuropathy,due tochemotherapy -not discussed today, likely resolved   5.Osteopenia -Her 07/2018 DEXA showsosteopeniawith lowest T-score of -2.1 at right femur neck  -she previously declined bisphosphonate -continue calcium and VitD and weight bearing exercise   6. Weight management  -intentional weight loss through healthy diet and physical exercise   7. Cancer screening  -she will not get colonoscopy, but does Cologuard testing. UTD on PAP   Disposition:  Darlene Beasley is clinically doing well. She continues letrozole, tolerating mostly well with joint pain that fluctuates with cold weather, similar to her baseline. She can take tylenol vs NSAIDs. Labs unremarkable. Exam is benign. No clinical concern for recurrence. She will continue letrozole and breast cancer surveillance including self exam. Left mammogram due annually in July.   She will return for lab and f/u in 4-6 months.   I encouraged her to continue PCP f/u in the meantime. She is not interested in COVID19 vaccine.    Orders Placed This Encounter  Procedures  . MM DIAG BREAST TOMO UNI LEFT    Standing Status:   Future    Standing Expiration Date:   08/18/2020    Order Specific Question:   Reason for Exam (SYMPTOM  OR DIAGNOSIS REQUIRED)    Answer:   h/o bilateral breast cancer s/p right mastectomy and left lumpectomy    Order Specific Question:   Preferred imaging location?    Answer:   [facility]   All questions were answered. The patient knows to call the clinic with any problems, questions or concerns. No barriers to learning was detected.   Alla Feeling, NP 08/19/19

## 2019-08-20 ENCOUNTER — Telehealth: Payer: Self-pay | Admitting: Hematology

## 2019-08-20 ENCOUNTER — Other Ambulatory Visit: Payer: Medicare HMO

## 2019-08-20 ENCOUNTER — Ambulatory Visit: Payer: Medicare HMO | Admitting: Hematology

## 2019-08-20 NOTE — Telephone Encounter (Signed)
Scheduled appt per 3/11 los.  Sent a message to HIM pool to get a calendar mailed out.

## 2019-10-18 ENCOUNTER — Encounter: Payer: Self-pay | Admitting: Family Medicine

## 2019-10-18 ENCOUNTER — Other Ambulatory Visit: Payer: Self-pay

## 2019-10-18 ENCOUNTER — Telehealth (INDEPENDENT_AMBULATORY_CARE_PROVIDER_SITE_OTHER): Payer: Medicaid Other | Admitting: Family Medicine

## 2019-10-18 VITALS — Temp 98.0°F | Ht 64.0 in | Wt 155.0 lb

## 2019-10-18 DIAGNOSIS — B349 Viral infection, unspecified: Secondary | ICD-10-CM

## 2019-10-18 DIAGNOSIS — R059 Cough, unspecified: Secondary | ICD-10-CM

## 2019-10-18 DIAGNOSIS — R509 Fever, unspecified: Secondary | ICD-10-CM | POA: Diagnosis not present

## 2019-10-18 DIAGNOSIS — R05 Cough: Secondary | ICD-10-CM | POA: Diagnosis not present

## 2019-10-18 NOTE — Progress Notes (Signed)
Virtual Visit via audio Note  I connected with Darlene Beasley on 10/18/19 at 5:29 PM by audio - patient not able to connect to video enabled telemedicine application. Verified that I am speaking with the correct person using two identifiers.   I discussed the limitations, risks, security and privacy concerns of performing an evaluation and management service by telephone and the availability of in person appointments. I also discussed with the patient that there may be a patient responsible charge related to this service. The patient expressed understanding and agreed to proceed, consent obtained  Chief complaint: Chief Complaint  Patient presents with  . Fever    comes and goes since wednesday. went to urgent care and was tested neg for flu and covid   . memory problems    disorented     History of Present Illness: Darlene Beasley is a 64 y.o. female  Fever: Had been having cold chills for past 2 weeks. Started 5 days ago with fever low grade initially, then up to 100.2 2 days ago. Fevers off and on.  cough, sinus congestion 4-5 days ago, then resolved. PND, then coughed up. Cough/congestion resolved past 3 days. general fatigue past few days. Feels weak, some sweating today, no measured fever.  No abd pain/n/v.  No dysuria/urgency.  No dyspnea, change in taste or smell. No known exposure to Covid 19.  eval at urgent care yesterday  - reported negative covid and flu testing were negative.  No tick bites known. Told had viral infection.  Eating and drinking ok but decreased appetite. Increased sleep. Slightly disoriented after sleeping but not at present.  Feels better today, no fever today, just still run down.  Heat rash on back from lying on chair in sun few weeks -ago - resolving. No new rash.  Hx of breast cancer, 2019, s/p chemo, lumpectomy, adjuvant radiation. Antiestrogen therapy.   Tx: [facility]a on occasion.   Patient Active Problem List   Diagnosis Date Noted  . Genetic  testing 11/14/2017  . 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 Adopted   . Port-A-Cath in place 10/22/2017  . Cancer of overlapping sites of right female breast ([place]) 10/08/2017  . Malignant neoplasm of left breast in female, estrogen receptor positive ([place]) 10/08/2017   Past Medical History:  Diagnosis Date  . Adopted   . Arthritis   . breast ca dx'd 08/2017  . Genetic testing 11/14/2017   Multi-Cancer panel (83 genes) @ [facility] - Pathogenic mutation in the MITF gene  . GERD (gastroesophageal reflux disease)   . 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 Personal history of chemotherapy   . Personal history of radiation therapy    Past Surgical History:  Procedure Laterality Date  . BREAST LUMPECTOMY Left 04/03/2018  . BREAST LUMPECTOMY WITH RADIOACTIVE SEED AND SENTINEL LYMPH NODE BIOPSY Left 04/03/2018   Procedure: LEFT BREAST LUMPECTOMY WITH RADIOACTIVE SEED AND LEFT SENTINEL LYMPH NODE BIOPSY;  Surgeon: [name], MD;  Location: [facility];  Service: General;  Laterality: Left;  . IR IMAGING GUIDED PORT INSERTION  10/20/2017  . IR US GUIDE VASC ACCESS LEFT  10/20/2017  . MASTECTOMY Right 04/03/2018  . MASTECTOMY MODIFIED RADICAL Right 04/03/2018   Procedure: MASTECTOMY MODIFIED RADICAL;  Surgeon: [name], MD;  Location: [facility];  Service: General;  Laterality: Right;  . PORT-A-CATH REMOVAL N/A 04/03/2018   Procedure: REMOVAL PORT-A-CATH;  Surgeon: Dalbert Batman,  Renelda Loma, MD;  Location: [facility];  Service: General;  Laterality: N/A;  . TONSILLECTOMY Bilateral   . TUBAL LIGATION     Allergies  Allergen Reactions  . Bee Venom    Prior to Admission medications   Medication Sig Start Date End Date Taking? Authorizing Provider  amLODipine (NORVASC) 2.5 MG tablet Take 1 tablet (2.5 mg total) by mouth daily. 08/04/19  Yes [name], MD  b complex  vitamins tablet Take 1 tablet by mouth daily.   Yes [provider]  calcium-vitamin D (CALCIUM 500/D) 500-200 MG-UNIT tablet Take 1 tablet by mouth.   Yes [provider]  letrozole (FEMARA) 2.5 MG tablet Take 1 tablet (2.5 mg total) by mouth daily. 07/08/19  Yes [name], MD  Ubiquinol (UBQH) 100 MG CAPS Take by mouth.   Yes [provider]   Social History   Socioeconomic History  . Marital status: Significant Other    Spouse name: Not on file  . Number of children: 1  . Years of education: Not on file  . Highest education level: Not on file  Occupational History  . Not on file  Tobacco Use  . Smoking status: Current Every Day Smoker    Packs/day: 0.50    Years: 51.00    Pack years: 25.50    Types: Cigarettes  . Smokeless tobacco: Never Used  . Tobacco comment: Working on quitting.  Substance and Sexual Activity  . Alcohol use: No    Comment: stopped 08/2017   . Drug use: Yes    Types: Marijuana    Comment: daily, since age 10   . Sexual activity: Not Currently    Birth control/protection: Sponge, Surgical  Other Topics Concern  . Not on file  Social History Narrative  . Not on file   Social Determinants of Health   Financial Resource Strain:   . Difficulty of Paying Living Expenses:   Food Insecurity:   . Worried About Charity fundraiser in the Last Year:   . Arboriculturist in the Last Year:   Transportation Needs:   . Film/video editor (Medical):   [name] Lack of Transportation (Non-Medical):   Physical Activity:   . Days of Exercise per Week:   . Minutes of Exercise per Session:   Stress:   . Feeling of Stress :   Social Connections:   . Frequency of Communication with Friends and Family:   . Frequency of Social Gatherings with Friends and Family:   . Attends Religious Services:   . Active Member of Clubs or Organizations:   . Attends Archivist Meetings:   [name] Marital Status:   Intimate Partner Violence:   . Fear of  Current or Ex-Partner:   . Emotionally Abused:   [name] Physically Abused:   . Sexually Abused:     Observations/Objective: [name] Vitals:   10/18/19 1321  Temp: 98 F (36.7 C)  TempSrc: Oral  Weight: 155 lb (70.3 kg)  Height: 5\' 4"  (1.626 m)   No distress on phone - appropriate responses. No cough or respratory distress. Speaking in full sentences.   Assessment and Plan: Fever, unspecified  Cough  Viral illness Suspected viral illness, reassuring reported negative flu and Covid testing at day 4 of illness.  Afebrile today with some symptomatic improvement, just persistent fatigue.  Symptomatic care discussed.  ER/RTC precautions given.  Follow Up Instructions: As needed with ER precautions.   I discussed the assessment and  treatment plan with the patient. The patient was provided an opportunity to ask questions and all were answered. The patient agreed with the plan and demonstrated an understanding of the instructions.   The patient was advised to call back or seek an in-person evaluation if the symptoms worsen or if the condition fails to improve as anticipated.  I provided 23 minutes of non-face-to-face time during this encounter.   [name], MD

## 2019-10-18 NOTE — Patient Instructions (Signed)
° ° ° °  If you have lab work done today you will be contacted with your lab results within the next 2 weeks.  If you have not heard from us then please contact us. The fastest way to get your results is to register for My Chart. ° ° °IF you received an x-ray today, you will receive an invoice from [facility]. Please contact [facility] at [phone] with questions or concerns regarding your invoice.  ° °IF you received labwork today, you will receive an invoice from [facility]. Please contact [facility] at [phone] with questions or concerns regarding your invoice.  ° °Our billing staff will not be able to assist you with questions regarding bills from these companies. ° °You will be contacted with the lab results as soon as they are available. The fastest way to get your results is to activate your My Chart account. Instructions are located on the last page of this paperwork. If you have not heard from us regarding the results in 2 weeks, please contact this office. °  ° ° ° °

## 2019-10-19 ENCOUNTER — Telehealth: Payer: Self-pay | Admitting: Family Medicine

## 2019-10-19 NOTE — Telephone Encounter (Signed)
10/19/2019 - PATIENT HAD A TELEMED VISIT WITH DR. Carlota Raspberry ON Monday (10/18/2019). DR. Carlota Raspberry HAS REQUESTED SHE HAVE A FOLLOW-UP APPOINTMENT IN 2 DAYS IF SHE HAS NOT IMPROVED OR GO TO THE EMERGENCY ROOM. I TRIED TO CALL TO SEE HOW SHE WAS DOING BUT HER VOICE MAIL IS NOT SET UP. [place]

## 2019-10-20 NOTE — Telephone Encounter (Signed)
Pt called state she still felling sick after 59 Hrs Doctor Carlota Raspberry tells her to Call back if she still feeling sick after 48 hrs Please Advice

## 2019-10-20 NOTE — Telephone Encounter (Signed)
Is she drinking fluids?  Any fever?  Any worsening symptoms. Let me know her status Thursday am to decide on next step.

## 2019-10-20 NOTE — Telephone Encounter (Signed)
Pt called this morning she is not feeling better since her telemed visit with you Monday, in your note it states to seek out another appt or possibly go to ED if necessary. Pt still struggling with general fatigue and weakness. Should I make her an in person visit since her testing was negative  or is there something else she should do?

## 2019-10-21 NOTE — Telephone Encounter (Signed)
If improving, can continue to monitor for improvement. OV Monday next week if possible, or to Urgent Care/ER over the weekend if any worsening.

## 2019-10-21 NOTE — Telephone Encounter (Signed)
Spoke with pt and she stated that she is drinking plenty of water and lightly sweetened tea, no fever and no worsening symptoms. She also stated that her sweating has gotten a little better.  Please Advise

## 2019-10-22 ENCOUNTER — Encounter: Payer: Self-pay | Admitting: Family Medicine

## 2019-10-22 ENCOUNTER — Telehealth (INDEPENDENT_AMBULATORY_CARE_PROVIDER_SITE_OTHER): Payer: Medicaid Other | Admitting: Family Medicine

## 2019-10-22 ENCOUNTER — Other Ambulatory Visit: Payer: Self-pay

## 2019-10-22 VITALS — Temp 98.2°F

## 2019-10-22 DIAGNOSIS — B349 Viral infection, unspecified: Secondary | ICD-10-CM

## 2019-10-22 DIAGNOSIS — R509 Fever, unspecified: Secondary | ICD-10-CM | POA: Diagnosis not present

## 2019-10-22 NOTE — Telephone Encounter (Signed)
Called pt and made wanted virtual appt for today @ 5

## 2019-10-22 NOTE — Telephone Encounter (Signed)
Pls call the pt to see if follow up appt via mychart is needed

## 2019-10-22 NOTE — Progress Notes (Signed)
Virtual Visit via Telephone Note  I connected with Darlene Beasley on 10/22/19 at 7:06 PM by telephone and verified that I am speaking with the correct person using two identifiers.   I discussed the limitations, risks, security and privacy concerns of performing an evaluation and management service by telephone and the availability of in person appointments. I also discussed with the patient that there may be a patient responsible charge related to this service. The patient expressed understanding and agreed to proceed, consent obtained  Chief complaint: Chief Complaint  Patient presents with  . Fever    pt states she is still getting fevers but is feeling a little better     History of Present Illness:  Follow up viral illness:  Fever, chills, suspected viral illness.  Virtual visit 4 days ago.  Chills for 2 weeks prior, temp 100.2 days prior with low-grade fevers few days prior to that time.  Sinus congestion 4 to 5 days prior.  Cough and congestion for 3 days, that had improved.  Reports negative Covid and flu testing at urgent care on 10/17/19.  Thought to have viral infection.  Decreased appetite, persistent fatigue but overall improving at video visit.   Since last visit.  No measured fever past few days. Temp 99  - 3 days ago. No recent antipyretics.  Slight sweating this am - improving, not much sweating at all today.  Sleeping better.  Energy level is improving.  Overall has continued to improve. Appetite is improving and able to eat and drink.  No shortness of breath. Plans to try to RTW in 2 days - trial of some work that day.     Patient Active Problem List   Diagnosis Date Noted  . Genetic testing 11/14/2017  . 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 Adopted   . Port-A-Cath in place 10/22/2017  . Cancer of overlapping sites of right female breast ([place]) 10/08/2017  . Malignant neoplasm of left breast in female, estrogen receptor positive ([place]) 10/08/2017     Past Medical History:  Diagnosis Date  . Adopted   . Arthritis   . breast ca dx'd 08/2017  . Genetic testing 11/14/2017   Multi-Cancer panel (83 genes) @ [facility] - Pathogenic mutation in the MITF gene  . GERD (gastroesophageal reflux disease)   . 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 Personal history of chemotherapy   . Personal history of radiation therapy    Past Surgical History:  Procedure Laterality Date  . BREAST LUMPECTOMY Left 04/03/2018  . BREAST LUMPECTOMY WITH RADIOACTIVE SEED AND SENTINEL LYMPH NODE BIOPSY Left 04/03/2018   Procedure: LEFT BREAST LUMPECTOMY WITH RADIOACTIVE SEED AND LEFT SENTINEL LYMPH NODE BIOPSY;  Surgeon: [name], MD;  Location: [facility];  Service: General;  Laterality: Left;  . IR IMAGING GUIDED PORT INSERTION  10/20/2017  . IR US GUIDE VASC ACCESS LEFT  10/20/2017  . MASTECTOMY Right 04/03/2018  . MASTECTOMY MODIFIED RADICAL Right 04/03/2018   Procedure: MASTECTOMY MODIFIED RADICAL;  Surgeon: [name], MD;  Location: [facility];  Service: General;  Laterality: Right;  . PORT-A-CATH REMOVAL N/A 04/03/2018   Procedure: REMOVAL PORT-A-CATH;  Surgeon: [name], MD;  Location: [facility];  Service: General;  Laterality: N/A;  . TONSILLECTOMY Bilateral   . TUBAL LIGATION     Allergies  Allergen Reactions  . Bee Venom    Prior to Admission medications  Medication Sig Start Date End Date Taking? Authorizing Provider  amLODipine (NORVASC) 2.5 MG tablet Take 1 tablet (2.5 mg total) by mouth daily. 08/04/19  Yes [name], MD  b complex vitamins tablet Take 1 tablet by mouth daily.   Yes [provider]  calcium-vitamin D (CALCIUM 500/D) 500-200 MG-UNIT tablet Take 1 tablet by mouth.   Yes [provider]  letrozole (FEMARA) 2.5 MG tablet Take 1 tablet (2.5 mg total) by mouth daily. 07/08/19  Yes  [name], MD  Ubiquinol (UBQH) 100 MG CAPS Take by mouth.   Yes [provider]   Social History   Socioeconomic History  . Marital status: Significant Other    Spouse name: Not on file  . Number of children: 1  . Years of education: Not on file  . Highest education level: Not on file  Occupational History  . Not on file  Tobacco Use  . Smoking status: Current Every Day Smoker    Packs/day: 0.50    Years: 51.00    Pack years: 25.50    Types: Cigarettes  . Smokeless tobacco: Never Used  . Tobacco comment: Working on quitting.  Substance and Sexual Activity  . Alcohol use: No    Comment: stopped 08/2017   . Drug use: Yes    Types: Marijuana    Comment: daily, since age 35   . Sexual activity: Not Currently    Birth control/protection: Sponge, Surgical  Other Topics Concern  . Not on file  Social History Narrative  . Not on file   Social Determinants of Health   Financial Resource Strain:   . Difficulty of Paying Living Expenses:   Food Insecurity:   . Worried About Charity fundraiser in the Last Year:   . Arboriculturist in the Last Year:   Transportation Needs:   . Film/video editor (Medical):   [name] Lack of Transportation (Non-Medical):   Physical Activity:   . Days of Exercise per Week:   . Minutes of Exercise per Session:   Stress:   . Feeling of Stress :   Social Connections:   . Frequency of Communication with Friends and Family:   . Frequency of Social Gatherings with Friends and Family:   . Attends Religious Services:   . Active Member of Clubs or Organizations:   . Attends Archivist Meetings:   [name] Marital Status:   Intimate Partner Violence:   . Fear of Current or Ex-Partner:   . Emotionally Abused:   [name] Physically Abused:   . Sexually Abused:    Observations/Objective: Vitals:   10/22/19 1635  Temp: 98.2 F (36.8 C)  TempSrc: Oral  no distress on phone. Speaking in full sentences. No distress.   Assessment and  Plan: Fever, unspecified  Viral illness  Suspected viral illness, improving.  No recent fevers off antipyretics previous negative Covid and flu testing.  Initial symptoms of fever, cough, congestion reportedly were on May 5, and without recent fever off antipyretics, improving symptoms, even if that were false negative Covid testing, she is improving now. No further testing at this point. Continue symptomatic care, RTC precautions, ER precautions given.   Follow Up Instructions:  As needed with urgent care/ER precautions.    I discussed the assessment and treatment plan with the patient. The patient was provided an opportunity to ask questions and all were answered. The patient agreed with the plan and demonstrated an understanding of the instructions.  The patient was advised to call back or seek an in-person evaluation if the symptoms worsen or if the condition fails to improve as anticipated.  I provided 13 minutes of non-face-to-face time during this encounter.  Signed,   [name], MD [facility].  10/22/19

## 2019-10-22 NOTE — Patient Instructions (Signed)
° ° ° °  If you have lab work done today you will be contacted with your lab results within the next 2 weeks.  If you have not heard from us then please contact us. The fastest way to get your results is to register for My Chart. ° ° °IF you received an x-ray today, you will receive an invoice from [facility]. Please contact [facility] at [phone] with questions or concerns regarding your invoice.  ° °IF you received labwork today, you will receive an invoice from [facility]. Please contact [facility] at [phone] with questions or concerns regarding your invoice.  ° °Our billing staff will not be able to assist you with questions regarding bills from these companies. ° °You will be contacted with the lab results as soon as they are available. The fastest way to get your results is to activate your My Chart account. Instructions are located on the last page of this paperwork. If you have not heard from us regarding the results in 2 weeks, please contact this office. °  ° ° ° °

## 2019-11-03 ENCOUNTER — Encounter: Payer: Self-pay | Admitting: Family Medicine

## 2019-11-03 ENCOUNTER — Ambulatory Visit (INDEPENDENT_AMBULATORY_CARE_PROVIDER_SITE_OTHER): Payer: Medicaid Other | Admitting: Family Medicine

## 2019-11-03 ENCOUNTER — Other Ambulatory Visit: Payer: Self-pay

## 2019-11-03 VITALS — BP 146/78 | HR 73 | Temp 97.8°F | Resp 15 | Ht 64.0 in | Wt 162.4 lb

## 2019-11-03 DIAGNOSIS — I10 Essential (primary) hypertension: Secondary | ICD-10-CM | POA: Diagnosis not present

## 2019-11-03 MED ORDER — AMLODIPINE BESYLATE 5 MG PO TABS: 5.0000 mg | ORAL_TABLET | Freq: Every day | ORAL | 1 refills | Status: DC

## 2019-11-03 NOTE — Progress Notes (Signed)
Subjective:  Patient ID: Darlene Beasley, female    DOB: 1955/12/07  Age: 64 y.o. MRN: [id number]  CC:  Chief Complaint  Patient presents with  . medication managment    pt denies any side effects or concerns at this time, pt states she has not taken any recent readings of BP due to recent illness.   . Hypertension    pt denies side effects, pt reports still not taking amlodapine most recent readings 150/70    HPI Darlene Beasley presents for   Feels better from fever/viral illness. Back at baseline.   Hypertension: Amlodipine 2.5 mg daily.  Occasional tired with work. 60hrs.week.  no new side effects.  Home readings: no recent, but 151 on top number a month ago.  BP Readings from Last 3 Encounters:  11/03/19 (!) 146/78  08/19/19 (!) 153/95  08/04/19 (!) 162/80   Lab Results  Component Value Date   CREATININE 0.86 08/19/2019      History Patient Active Problem List   Diagnosis Date Noted  . Genetic testing 11/14/2017  . 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 Adopted   . Port-A-Cath in place 10/22/2017  . Cancer of overlapping sites of right female breast ([place]) 10/08/2017  . Malignant neoplasm of left breast in female, estrogen receptor positive ([place]) 10/08/2017   Past Medical History:  Diagnosis Date  . Adopted   . Arthritis   . breast ca dx'd 08/2017  . Genetic testing 11/14/2017   Multi-Cancer panel (83 genes) @ [facility] - Pathogenic mutation in the MITF gene  . GERD (gastroesophageal reflux disease)   . 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 Personal history of chemotherapy   . Personal history of radiation therapy    Past Surgical History:  Procedure Laterality Date  . BREAST LUMPECTOMY Left 04/03/2018  . BREAST LUMPECTOMY WITH RADIOACTIVE SEED AND SENTINEL LYMPH NODE BIOPSY Left 04/03/2018   Procedure: LEFT BREAST LUMPECTOMY WITH RADIOACTIVE SEED AND LEFT SENTINEL LYMPH NODE  BIOPSY;  Surgeon: [name], MD;  Location: [facility];  Service: General;  Laterality: Left;  . IR IMAGING GUIDED PORT INSERTION  10/20/2017  . IR US GUIDE VASC ACCESS LEFT  10/20/2017  . MASTECTOMY Right 04/03/2018  . MASTECTOMY MODIFIED RADICAL Right 04/03/2018   Procedure: MASTECTOMY MODIFIED RADICAL;  Surgeon: [name], MD;  Location: [facility];  Service: General;  Laterality: Right;  . PORT-A-CATH REMOVAL N/A 04/03/2018   Procedure: REMOVAL PORT-A-CATH;  Surgeon: [name], MD;  Location: [facility];  Service: General;  Laterality: N/A;  . TONSILLECTOMY Bilateral   . TUBAL LIGATION     Allergies  Allergen Reactions  . Bee Venom    Prior to Admission medications   Medication Sig Start Date End Date Taking? Authorizing Provider  b complex vitamins tablet Take 1 tablet by mouth daily.   Yes [provider]  calcium-vitamin D (CALCIUM 500/D) 500-200 MG-UNIT tablet Take 1 tablet by mouth.   Yes [provider]  letrozole (FEMARA) 2.5 MG tablet Take 1 tablet (2.5 mg total) by mouth daily. 07/08/19  Yes [name], MD  Ubiquinol (UBQH) 100 MG CAPS Take by mouth.   Yes [provider]  amLODipine (NORVASC) 2.5 MG tablet Take 1 tablet (2.5 mg total) by mouth daily. Patient not taking: Reported on 11/03/2019 08/04/19   [name], MD   Social History   Socioeconomic History  .  Marital status: Significant Other    Spouse name: Not on file  . Number of children: 1  . Years of education: Not on file  . Highest education level: Not on file  Occupational History  . Not on file  Tobacco Use  . Smoking status: Current Every Day Smoker    Packs/day: 0.50    Years: 51.00    Pack years: 25.50    Types: Cigarettes  . Smokeless tobacco: Never Used  . Tobacco comment: Working on quitting.  Substance and Sexual Activity  . Alcohol use: No    Comment: stopped 08/2017   . Drug use: Yes    Types:  Marijuana    Comment: daily, since age 62   . Sexual activity: Not Currently    Birth control/protection: Sponge, Surgical  Other Topics Concern  . Not on file  Social History Narrative  . Not on file   Social Determinants of Health   Financial Resource Strain:   . Difficulty of Paying Living Expenses:   Food Insecurity:   . Worried About Charity fundraiser in the Last Year:   . Arboriculturist in the Last Year:   Transportation Needs:   . Film/video editor (Medical):   [name] Lack of Transportation (Non-Medical):   Physical Activity:   . Days of Exercise per Week:   . Minutes of Exercise per Session:   Stress:   . Feeling of Stress :   Social Connections:   . Frequency of Communication with Friends and Family:   . Frequency of Social Gatherings with Friends and Family:   . Attends Religious Services:   . Active Member of Clubs or Organizations:   . Attends Archivist Meetings:   [name] Marital Status:   Intimate Partner Violence:   . Fear of Current or Ex-Partner:   . Emotionally Abused:   [name] Physically Abused:   . Sexually Abused:     Review of Systems  Constitutional: Negative for fatigue and unexpected weight change.  Respiratory: Negative for chest tightness and shortness of breath.   Cardiovascular: Negative for chest pain, palpitations and leg swelling.  Gastrointestinal: Negative for abdominal pain and blood in stool.  Neurological: Negative for dizziness, syncope, light-headedness and headaches.     Objective:   Vitals:   11/03/19 1525 11/03/19 1532  BP: (!) 170/79 (!) 146/78  Pulse: 73   Resp: 15   Temp: 97.8 F (36.6 C)   TempSrc: Temporal   SpO2: 97%   Weight: 162 lb 6.4 oz (73.7 kg)   Height: 5\' 4"  (1.626 m)      Physical Exam Vitals reviewed.  Constitutional:      Appearance: She is well-developed.  HENT:     Head: Normocephalic and atraumatic.  Eyes:     Conjunctiva/sclera: Conjunctivae normal.     Pupils: Pupils are equal,  round, and reactive to light.  Neck:     Vascular: No carotid bruit.  Cardiovascular:     Rate and Rhythm: Normal rate and regular rhythm.     Heart sounds: Normal heart sounds.  Pulmonary:     Effort: Pulmonary effort is normal.     Breath sounds: Normal breath sounds.  Abdominal:     Palpations: Abdomen is soft. There is no pulsatile mass.     Tenderness: There is no abdominal tenderness.  Skin:    General: Skin is warm and dry.  Neurological:     Mental Status: She is alert and oriented  to person, place, and time.  Psychiatric:        Behavior: Behavior normal.        Assessment & Plan:  Darlene Beasley is a 64 y.o. female . Essential hypertension - Plan: amLODipine (NORVASC) 5 MG tablet  - increase to 5mg  norvasc for improved control, possible side effects with RTc precautions given.   Meds ordered this encounter  Medications  . amLODipine (NORVASC) 5 MG tablet    Sig: Take 1 tablet (5 mg total) by mouth daily.    Dispense:  90 tablet    Refill:  1   Patient Instructions     Increase amlodipine to 5mg  per day. Let me know if any side effects.   Recheck for fasting labs in 3 months.   If you have lab work done today you will be contacted with your lab results within the next 2 weeks.  If you have not heard from [place] then please contact us. The fastest way to get your results is to register for My Chart.   IF you received an x-ray today, you will receive an invoice from [facility]. Please contact [facility] at [phone] with questions or concerns regarding your invoice.   IF you received labwork today, you will receive an invoice from [facility]. Please contact [facility] at [phone] with questions or concerns regarding your invoice.   Our billing staff will not be able to assist you with questions regarding bills from these companies.  You will be contacted with the lab results as soon as they are available. The fastest way to get your  results is to activate your My Chart account. Instructions are located on the last page of this paperwork. If you have not heard from [place] regarding the results in 2 weeks, please contact this office.         Signed, [name], MD [facility]

## 2019-11-03 NOTE — Patient Instructions (Addendum)
   Increase amlodipine to 5mg  per day. Let me know if any side effects.   Recheck for fasting labs in 3 months.   If you have lab work done today you will be contacted with your lab results within the next 2 weeks.  If you have not heard from [place] then please contact us. The fastest way to get your results is to register for My Chart.   IF you received an x-ray today, you will receive an invoice from [facility]. Please contact [facility] at [phone] with questions or concerns regarding your invoice.   IF you received labwork today, you will receive an invoice from [facility]. Please contact [facility] at [phone] with questions or concerns regarding your invoice.   Our billing staff will not be able to assist you with questions regarding bills from these companies.  You will be contacted with the lab results as soon as they are available. The fastest way to get your results is to activate your My Chart account. Instructions are located on the last page of this paperwork. If you have not heard from [place] regarding the results in 2 weeks, please contact this office.

## 2020-01-17 NOTE — Progress Notes (Signed)
Darlene Beasley   [phone] Fax:([phone]) [phone]   Clinic Follow up Note   Patient Care Team: [name], MD as PCP - General (Family Medicine) [name], MD as Consulting Physician (General Surgery) [name], MD as Consulting Physician (Hematology) Kyung Rudd, MD as Consulting Physician (Radiation Oncology) Alla Feeling, NP as Nurse Practitioner (Nurse Practitioner)  Date of Service:  01/20/2020  CHIEF COMPLAINT: F/u onbilateralbreast cancer  SUMMARY OF ONCOLOGIC HISTORY: Oncology History Overview Note  Cancer Staging Cancer of overlapping sites of right female breast [facility]) Staging form: Breast, AJCC 8th Edition - Clinical stage from 09/23/2017: Stage IIIA (cT3, cN1, cM0, G2, ER+, PR-, HER2-) - Signed by [name], MD on 10/09/2017 - Pathologic stage from 04/03/2018: No Stage Recommended (ypT3, pN2a, cM0, G3, ER+, PR-, HER2-) - Signed by [name], MD on 05/03/2018  Malignant neoplasm of left breast in female, estrogen receptor positive ([place]) Staging form: Breast, AJCC 8th Edition - Clinical: Stage IA (cT1b, cN0, cM0, G1, ER+, PR+, HER2-) - Unsigned - Pathologic stage from 04/03/2018: No Stage Recommended (ypT1b, pN0, cM0, G1, ER+, PR+, HER2-) - Signed by [name], MD on 05/03/2018     Cancer of overlapping sites of right female breast ([place])  09/09/2017 Mammogram   IMPRESSION: 1. Highly suspicious large right breast mass involving the outer and upper breast extending from approximately 9 o'clock through 12-1 o'clock, maximum measurement approximating 7.5 cm. The mass involves the right nipple, accounting for nipple retraction. Architectural distortion, microcalcifications and diffuse trabecular thickening and skin thickening are associated with the large mass, raising the possibility of this representing an inflammatory cancer. 2. Satellite masses separate from the dominant mass involving the lower outer quadrant and the upper inner quadrant  of the right breast, measured above. 3. Two adjacent pathologic right axillary lymph nodes. 4. Indeterminate 0.9 cm solid mass involving the upper outer quadrant of the left breast which accounts for a mammographic finding. 5. No pathologic left axillary lymphadenopathy.   09/09/2017 Breast US   Targeted right breast ultrasound is performed, showing a very large hypoechoic mass with irregular margins extending from the approximate 9 o'clock position through the 12 to 1 o'clock position. On the CC gait image, the mass measures maximally approximately 7.5 cm. The mass has irregular margins, demonstrates acoustic shadowing, and demonstrates internal power Doppler flow. The mass extends into the retroareolar region and involves the nipple, accounting for the nipple retraction.  Separate from the dominant mass at the 7 o'clock position approximately 4 cm from nipple is a hypoechoic antiparallel mass with irregular margins measuring approximately 1.4 x 2.7 x 1.7 cm.  Separate from the dominant mass at the 1 o'clock position approximately 2 cm from nipple is a hypoechoic mass with irregular margins measuring approximately 1.7 x 0.5 x 1.6 cm. Both of these masses also demonstrate internal power Doppler flow.  Sonographic evaluation of the right axilla demonstrates 2 adjacent pathologic lymph nodes, the larger measuring approximately 2.5 x 2.5 x 2.8 cm, the smaller measuring approximately 2.0 x 0.8 x 2.7 cm.   09/23/2017 Initial Biopsy   Diagnosis 1. Breast, left, needle core biopsy, 2 o'clock - INVASIVE DUCTAL CARCINOMA. - SEE COMMENT. 2. Breast, right, needle core biopsy, 11:30 o'clock - INVASIVE MAMMARY CARCINOMA. - SEE COMMENT. 3. Lymph node, needle/core biopsy, right axilla - METASTATIC MAMMARY CARCINOMA IN 1 OF 1 LYMPH NODE (1/1). Microscopic Comment 1. There is a small focus of grade I invasive ductal carcinoma. A complete breast prognostic profile  will be attempted and the results reported  separately. The results were called to the [place] on 09/24/2017. 2. The carcinoma appears grade II. An E-Cadherin stain and a breast prognostic profile will be performed on part 2 and the results reported separately. (JBK:kh 09-24-17) [name]  1. HER2 Negative by FISH Estrogen receptor: 100% positive, strong staining Progesterone receptor: 80% positive, strong staining Ki67: 5%  2. HER2 Negative by FISH Estrogen receptor: 100% positive, strong staining Progesterone receptor: 0%, negative  Ki67: 30% 2. The tumor cells are strongly positive for E-cadherin, supporting a ductal phenotype   09/23/2017 Cancer Staging   Staging form: Breast, AJCC 8th Edition - Clinical stage from 09/23/2017: Stage IIIA (cT3, cN1, cM0, G2, ER+, PR-, HER2-) - Signed by [name], MD on 10/09/2017   10/07/2017 Breast MRI   IMPRESSION: 1. Biopsy proven malignancy involving all 4 quadrants of the right breast, although predominantly located in the anterior third of the central upper right breast measures 6.8 x 9.6 x 7.2 cm.  2. Five pathologic right axillary lymph nodes, compatible with biopsy proven axillary metastases.  3. Small mass with associated biopsy related changes in the upper-outer left breast at site of known malignancy measures 0.9 x 0.8 x 0.7 cm. No abnormal lymph nodes seen in the left axilla.  RECOMMENDATION: Treatment plan for known bilateral breast malignancy.  BI-RADS CATEGORY  6: Known biopsy-proven malignancy.   10/08/2017 Initial Diagnosis   Cancer of overlapping sites of right female breast ([place])   10/13/2017 Pathology Results   Diagnosis 1. Breast, right, needle core biopsy, 1 o'clock - INVASIVE DUCTAL CARCINOMA. - SEE COMMENT. 2. Breast, right, needle core biopsy, 7 o'clock - INVASIVE DUCTAL CARCINOMA. - SEE COMMENT. Microscopic Comment 1. and 2. The carcinoma in parts 1 and 2 is morphologically similar and appears grade II. Because breast prognostic profiles  were performed on the patients previous case, [id number], it will not be repeated on the current case unless requested.    10/17/2017 Imaging   CT CAP IMPRESSION: 1. Infiltrative right breast mass with overlying skin thickening is identified compatible with known breast cancer. 2. Enlarged right axillary lymph nodes compatible with metastatic adenopathy. 3. No additional sites of disease identified within the chest and no evidence for metastasis to the abdomen or pelvis. 4. Aortic atherosclerosis and 3 vessel coronary artery atherosclerotic calcifications. Aortic Atherosclerosis (ICD10-I70.0).   10/17/2017 Imaging   Bone Scan FINDINGS: There are no foci of increased or decreased radiotracer uptake to suggest osseous metastatic disease. 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 There is degenerative type uptake within both shoulders and the left ankle.  Normal physiologic activity is identified within the kidneys and urinary bladder.  IMPRESSION: No evidence of osseous metastatic disease.   10/22/2017 -  Chemotherapy   AC q2 weeks x4 cycles starting 10/22/17-12/03/17, followed by weekly taxol x12 12/17/17-03/11/18   11/14/2017 Genetic Testing   The genes that were analyzed were the 83 genes on [facility]'s Multi-Cancer panel (ALK, APC, ATM, AXIN2, BAP1, BARD1, BLM, BMPR1A, BRCA1, BRCA2, BRIP1, CASR, CDC73, CDH1, CDK4, CDKN1B, CDKN1C, CDKN2A, CEBPA, CHEK2, CTNNA1, DICER1, DIS3L2, EGFR, EPCAM, FH, FLCN, GATA2, GPC3, GREM1, HOXB13, HRAS, KIT, MAX, MEN1, MET, MITF, MLH1, MSH2, MSH3, MSH6, MUTYH, NBN, NF1, NF2, NTHL1, PALB2, PDGFRA, PHOX2B, PMS2, POLD1, POLE, POT1, PRKAR1A, PTCH1, PTEN, RAD50, RAD51C, RAD51D, RB1, RECQL4, RET, RUNX1, SDHA, SDHAF2, SDHB, SDHC, SDHD, SMAD4, SMARCA4, SMARCB1, SMARCE1, STK11,  SUFU, TERC, TERT, TMEM127, TP53, TSC1, TSC2, VHL, WRN, WT1).  Testingrevealed a pathogenic mutation  in the MITF genecalled c.952G>A (p.Glu318Lys) and A Variant of Unknown Significance (VUS) was also detected: POLD1 c.301A>T (p.Ile101Phe).   12/16/2017 Breast US   IMPRESSION: 1. Stable to minimal decrease in size of multiple irregular masses throughout the right breast consistent with the patient's sites of biopsy proven malignancy. While the mammographic appearance is slightly less dense in this region, there is increased skin and trabecular thickening involving the entire right breast. 2. Stable appearance of morphologically abnormal right axillary lymph nodes, consistent with biopsy proven metastatic disease. 3. Stable appearance of a left breast mass, consistent with biopsy proven malignancy.    04/03/2018 Cancer Staging   Staging form: Breast, AJCC 8th Edition - Pathologic stage from 04/03/2018: No Stage Recommended (ypT3, pN2a, cM0, G3, ER+, PR-, HER2-) - Signed by [name], MD on 05/03/2018   10/19/2018 -  Anti-estrogen oral therapy   Adjuvant letrozole 2.28m daily     Survivorship   Per LCira Rue NP    Malignant neoplasm of left breast in female, estrogen receptor positive ([place]  09/09/2017 Breast [place]  Targeted left breast ultrasound is performed, showing an oval parallel hypoechoic mass containing microcalcifications at the 2 o'clock position approximately 6 cm from nipple measuring approximately 0.9 x 0.5 x 0.9 cm, corresponding to the mammographic finding.  Sonographic evaluation of the left axilla demonstrates no pathologic lymphadenopathy.   09/23/2017 Initial Biopsy   Diagnosis 1. Breast, left, needle core biopsy, 2 o'clock - INVASIVE DUCTAL CARCINOMA. - SEE COMMENT. 2. Breast, right, needle core biopsy, 11:30 o'clock - INVASIVE MAMMARY CARCINOMA. - SEE COMMENT. 3. Lymph node, needle/core biopsy, right axilla - METASTATIC MAMMARY CARCINOMA IN 1 OF 1 LYMPH NODE (1/1). Microscopic Comment 1. There is a small focus of grade I invasive ductal carcinoma. A complete  breast prognostic profile will be attempted and the results reported separately. The results were called to the [place] 09/24/2017. 2. The carcinoma appears grade II. An E-Cadherin stain and a breast prognostic profile will be performed on part 2 and the results reported separately. (JBK:kh 09-24-17) [name]  1. HER2 Negative by FISH Estrogen receptor: 100% positive, strong staining Progesterone receptor: 80% positive, strong staining Ki67: 5%  2. HER2 Negative by FISH Estrogen receptor: 100% positive, strong staining Progesterone receptor: 0%, negative  Ki67: 30%   10/08/2017 Initial Diagnosis   Malignant neoplasm of left breast in female, estrogen receptor positive ([place]   12/16/2017 Breast [place]  IMPRESSION: 3. Stable appearance of a left breast mass, consistent with biopsy proven malignancy.    01/22/2018 Imaging   01/22/2018 MRI Bilateral Breast IMPRESSION: 1. Dominant enhancing mass with surrounding nodularity involving the central anterior third of the right breast now measures up to 6.4 x 6.7 x 7.6 cm (previously measured 6.8 x 9.6 x 7.2 cm). Persistent bulky right axillary lymphadenopathy, slightly decreased in size since prior MRI.  2. Biopsy proven left breast malignancy now measures 0.5 cm (previously measured 0.9 x 0.8 x 0.7 cm).    04/03/2018 Pathology Results   Diagnosis 1. Breast, lumpectomy, left with radioactive seed - RESIDUAL INVASIVE DUCTAL CARCINOMA STATUS POST NEOADJUVANT THERAPY (0.9 CM) - MARGINS UNINVOLVED BY CARCINOMA (0.2 CM; INFERIOR MARGIN) - CALCIFICATIONS ASSOCIATED WITH CARCINOMA - PREVIOUS BIOPSY SITE CHANGES PRESENT - SEE ONCOLOGY TABLE AND COMMENT BELOW 2. Lymph node, sentinel, biopsy, left axillary #1 - NO CARCINOMA IDENTIFIED IN ONE LYMPH NODE (0/1) -  SEE COMMENT 3. Lymph node, sentinel, biopsy, left axillary #2 - NO CARCINOMA IDENTIFIED IN ONE LYMPH NODE (0/1) - SEE COMMENT 4. Lymph node, sentinel, biopsy,  left - NO CARCINOMA IDENTIFIED IN ONE LYMPH NODE (0/1) - SEE COMMENT 5. Lymph node, sentinel, biopsy, left axillary #3 - NO CARCINOMA IDENTIFIED IN ONE LYMPH NODE (0/1) - SEE COMMENT 6. Breast, modified radical mastectomy , right - RESIDUAL INVASIVE DUCTAL CARCINOMA STATUS POST NEOADJUVANT THERAPY (8.5 CM) - EXTENSIVE LYMPHOVASCULAR SPACE INVASION PRESENT - METASTATIC CARCINOMA INVOLVING FIVE OF FIFTEEN LYMPH NODES WITH EXTRACAPSULAR EXTENSION (5/15) - MARGINS UNINVOLVED BY CARCINOMA - SEE ONCOLOGY TABLE AND COMMENT BELOW   04/03/2018 Receptors her2   The tumor cells are NEGATIVE for Her2 (1+). Estrogen Receptor: 95%, POSITIVE, STRONG STAINING INTENSITY Progesterone Receptor: 95%, POSITIVE, STRONG STAINING INTENSITY  The tumor cells are NEGATIVE for Her2 (1+). Estrogen Receptor: 100%, POSITIVE, STRONG STAINING INTENSITY Progesterone Receptor: 0%, NEGATIVE   04/03/2018 Cancer Staging   Staging form: Breast, AJCC 8th Edition - Pathologic stage from 04/03/2018: No Stage Recommended (ypT1b, pN0, cM0, G1, ER+, PR+, HER2-) - Signed by [name], MD on 05/03/2018   04/03/2018 Surgery   Left LEFT BREAST LUMPECTOMY WITH RADIOACTIVE SEED AND LEFT SENTINEL LYMPH NODE BIOPSY with [name], MD    05/20/2018 - 07/08/2018 Radiation Therapy   Daily adjuvant radiation treatment with Dr.Moody.    Radiation treatment dates:   05/20/2018 - 07/08/2018  Site/dose:   The patient initially received a dose of 50.4 Gy in 28 fractions to the right chest wall and supraclavicular region. This was delivered using a 3-D conformal, 4 field technique. The patient then received a boost to the mastectomy scar. This delivered an additional 10 Gy in 5 fractions using an en face electron field. The total dose was 60.4 Gy. At the same time, the patient received a dose of 50.4 Gy in 28 fractions to the left breast using whole-breast tangent fields. This was delivered using a 3-D conformal technique. The patient  then received a boost to the seroma. This delivered an additional 10 Gy in 5 fractions using 15E electrons with a special teletherapy technique. The total dose was 60.4 Gy.   10/19/2018 -  Anti-estrogen oral therapy   Letrozole 2.80m daily started 07/2018     Survivorship   Per LCira Rue NP       CURRENT THERAPY:  Letrozole 2.554mdaily starting 10/2018  INTERVAL HISTORY:  Darlene Beasley here for a follow up of b/l breast cancer. She was last seen by me 10 months ago and seen by NP Lacie 5 months ago in interim. She presents to the clinic alone. She notes she is doing well. She notes in the past 3 weeks she had a bee sting in her left leg, was burned on her left arm and mashed her left finger cooking. Her finger is sore, no true pain. The swelling has improved. She is on Letrozole.   She notes since her BP medication dose was increased she felt dizzy intermittently. This is proceeded by tightness of upper back, then get dizzy. Her BP has improved from 194. She notes she has lost weight on Keto diet. She has lost another 13 pounds.  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 She notes she still smokes and her not been able to cut back. She is interested in quitting.    REVIEW OF SYSTEMS:   Constitutional: Denies fevers, chills or abnormal weight loss (+)  Intermittent dizziness Eyes: Denies blurriness of vision Ears, nose, mouth, throat, and face: Denies mucositis or sore throat Respiratory: Denies cough, dyspnea or wheezes Cardiovascular: Denies palpitation, chest discomfort or lower extremity swelling Gastrointestinal:  Denies nausea, heartburn or change in bowel habits Skin: Denies abnormal skin rashes Lymphatics: Denies new lymphadenopathy or easy bruising Neurological:Denies numbness, tingling or new weaknesses Behavioral/Psych: Mood is stable, no new changes  All other systems were reviewed with the patient and are negative.  MEDICAL HISTORY:  Past  Medical History:  Diagnosis Date  . Adopted   . Arthritis   . breast ca dx'd 08/2017  . Genetic testing 11/14/2017   Multi-Cancer panel (83 genes) @ [facility] - Pathogenic mutation in the MITF gene  . GERD (gastroesophageal reflux disease)   . 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 Personal history of chemotherapy   . Personal history of radiation therapy     SURGICAL HISTORY: Past Surgical History:  Procedure Laterality Date  . BREAST LUMPECTOMY Left 04/03/2018  . BREAST LUMPECTOMY WITH RADIOACTIVE SEED AND SENTINEL LYMPH NODE BIOPSY Left 04/03/2018   Procedure: LEFT BREAST LUMPECTOMY WITH RADIOACTIVE SEED AND LEFT SENTINEL LYMPH NODE BIOPSY;  Surgeon: [name], MD;  Location: [facility];  Service: General;  Laterality: Left;  . IR IMAGING GUIDED PORT INSERTION  10/20/2017  . IR US GUIDE VASC ACCESS LEFT  10/20/2017  . MASTECTOMY Right 04/03/2018  . MASTECTOMY MODIFIED RADICAL Right 04/03/2018   Procedure: MASTECTOMY MODIFIED RADICAL;  Surgeon: [name], MD;  Location: [place];  Service: General;  Laterality: Right;  . PORT-A-CATH REMOVAL N/A 04/03/2018   Procedure: REMOVAL PORT-A-CATH;  Surgeon: [name], MD;  Location: [place];  Service: General;  Laterality: N/A;  . TONSILLECTOMY Bilateral   . TUBAL LIGATION      I have reviewed the social history and family history with the patient and they are unchanged from previous note.  ALLERGIES:  is allergic to bee venom.  MEDICATIONS:  Current Outpatient Medications  Medication Sig Dispense Refill  . amLODipine (NORVASC) 5 MG tablet Take 1 tablet (5 mg total) by mouth daily. 90 tablet 1  . b complex vitamins tablet Take 1 tablet by mouth daily.    . calcium-vitamin D (CALCIUM 500/D) 500-200 MG-UNIT tablet Take 1 tablet by mouth.    . letrozole (FEMARA) 2.5 MG tablet Take 1 tablet (2.5 mg total) by  mouth daily. 30 tablet 5  . Ubiquinol (UBQH) 100 MG CAPS Take by mouth.     No current facility-administered medications for this visit.    PHYSICAL EXAMINATION: ECOG PERFORMANCE STATUS: 0 - Asymptomatic  Vitals:   01/20/20 1335  BP: (!) 154/83  Pulse: 68  Resp: 18  Temp: (!) 97.1 F (36.2 C)  SpO2: 97%   Filed Weights   01/20/20 1335  Weight: 151 lb 9.6 oz (68.8 kg)    GENERAL:alert, no distress and comfortable SKIN: skin color, texture, turgor are normal, no rashes or significant lesions EYES: normal, Conjunctiva are pink and non-injected, sclera clear  NECK: supple, thyroid normal size, non-tender, without nodularity LYMPH:  no palpable lymphadenopathy in the cervical, axillary  LUNGS: clear to auscultation and percussion with normal breathing effort HEART: regular rate & rhythm and no murmurs and no lower extremity edema ABDOMEN:abdomen soft, non-tender and normal bowel sounds Musculoskeletal:no cyanosis of digits and no clubbing  NEURO: alert & oriented x 3 with fluent speech, no  focal motor/sensory deficits BREAST: S/p left lumpectomy and right mastectomy: Surgical incisions healed well. Breast exam benign.   LABORATORY DATA:  I have reviewed the data as listed CBC Latest Ref Rng & Units 01/20/2020 08/19/2019 03/22/2019  WBC 4.0 - 10.5 K/uL 6.5 8.1 6.3  Hemoglobin 12.0 - 15.0 g/dL 13.9 14.3 14.3  Hematocrit 36 - 46 % 41.0 42.7 42.8  Platelets 150 - 400 K/uL 198 220 219     CMP Latest Ref Rng & Units 01/20/2020 08/19/2019 03/22/2019  Glucose 70 - 99 mg/dL 96 93 105(H)  BUN 8 - 23 mg/dL 22 25(H) 16  Creatinine 0.44 - 1.00 mg/dL 0.79 0.86 0.86  Sodium 135 - 145 mmol/L 140 143 140  Potassium 3.5 - 5.1 mmol/L 3.8 4.5 4.2  Chloride 98 - 111 mmol/L 107 107 105  CO2 22 - 32 mmol/L _0 Calcium 8.9 - 10.3 mg/dL 9.7 9.1 9.2  Total Protein 6.5 - 8.1 g/dL 6.5 6.4(L) 6.5  Total Bilirubin 0.3 - 1.2 mg/dL 0.5 0.5 0.5  Alkaline Phos 38 - 126 U/L 64 68 71  AST 15 - 41  U/L 13(L) 15 14(L)  ALT 0 - 44 U/L _1 RADIOGRAPHIC STUDIES: I have personally reviewed the radiological images as listed and agreed with the findings in the report. MM DIAG BREAST TOMO UNI LEFT  Result Date: 01/20/2020 CLINICAL DATA:  64 year old female presenting for routine annual surveillance status post left breast lumpectomy in 2019. The patient also had a right breast mastectomy in 2019. EXAM: DIGITAL DIAGNOSTIC UNILATERAL LEFT MAMMOGRAM WITH TOMO AND CAD COMPARISON:  Previous exam(s). ACR Breast Density Category c: The breast tissue is heterogeneously dense, which may obscure small masses. FINDINGS: The left breast lumpectomy site is stable. The patient has had a right mastectomy. No suspicious calcifications, masses or areas of distortion are seen in the left breast. Mammographic images were processed with CAD. IMPRESSION: Stable left breast lumpectomy site. No mammographic evidence of malignancy in the left breast. RECOMMENDATION: Diagnostic left breast mammogram is recommended in 1 year. I have discussed the findings and recommendations with the patient. If applicable, a reminder letter will be sent to the patient regarding the next appointment. BI-RADS CATEGORY  2: Benign. Electronically Signed   By: Ammie Ferrier M.D.   On: 01/20/2020 09:04     ASSESSMENT & PLAN:  Darlene Beasley is a 64 y.o. female with   1.Bilateral breast cancer: right breast, invasiveductalcarcinoma in right, grade II, stage IIIA (cT3N1M0), ER 100% positive, PR 0%, negative, HER2 negative by FISH, Ki67 30% metastatic to 5 axillary lymph nodes, ypT3N2a; left breast - invasive ductal carcinoma, grade I, stage IA (cT1bN0M0), ER 100% positive, PR 80% positive, HER2 negative, Ki67 5%, ypT1bN0 -Diagnosed on 09/2017. 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 -She is s/pneoadjuvant chemo withAC-T,underwentleft breastlumpectomyand right mastectomy.She had a very limited response to neoadjuvant  chemotherapy.She also completed adjuvant radiation.  -Shestarted antiestrogen therapy with Darlene Beasley 10/2018. Plan for 7 years. Tolerating well with manageable night sweats and fatigue. No change in joint stiffness.  -She previously declined the options of clinical trials including [facility] trail and [facility] trail with oral Kisquali for locally advanced breast cancer -From a breast cancer standpoint she is doing well. Labs reviewed, CBC and CMP WNL. Her mammogram from today was benign. Physical exam unremarkable.  -Continue surveillance and Letrozole  -F/u in 6 months   2.Smokingcessation  -She has a long history of  smoking,>40 years. Smoking Cessation was repeatedly reviewed with her.  -She smokes 1/2 PPD now. She has not been able to reduce further.  -I recommend online smoking cessation counseling. She notes she does have nicorette. She is interested in quitting.   3.HTN -Restarted amlodipine in early 2021 per PCP -Her BP medication dose was recently increased and since she has had intermittent dizziness.  -Her BP at 154/83 today (01/20/20). I recommend she check her BP when she feels dizzy. If not related to her BP she can see neurologist as she may have reduced blood flow to head.   4.Osteopenia -Her 07/2018 DEXA showsosteopeniawith lowest T-score of -2.1 at right femur neck. Will repeat in 07/2020. -She previously declined bisphosphonate -Continue calcium and VitD and weight bearing exercise   5. Weight management  -With Keto diet she has had intentional weight loss through healthy diet and physical exercise  -Her goal weight is 135-140 pounds.   6. Cancer screening  -She never had colonoscopy. I previously encouraged her to consider colonoscopy for colon cancer screening. She will not get colonoscopy, but does Cologuard testing. UTD on PAP -I encouraged her to follow-up with PCP for Pap smear  Plan -Continue Letrozole -Lab and f/u in 6 months    No problem-specific Assessment & Plan notes found for this encounter.   No orders of the defined types were placed in this encounter.  All questions were answered. The patient knows to call the clinic with any problems, questions or concerns. No barriers to learning was detected. The total time spent in the appointment was 25 minutes.     [name], MD 01/20/2020   I, Joslyn Devon, am acting as scribe for [name], MD.   I have reviewed the above documentation for accuracy and completeness, and I agree with the above.

## 2020-01-20 ENCOUNTER — Ambulatory Visit
Admission: RE | Admit: 2020-01-20 | Discharge: 2020-01-20 | Disposition: A | Discharge status: Home / Self Care | Payer: Medicaid Other | Source: Ambulatory Visit | Attending: Nurse Practitioner | Admitting: Nurse Practitioner

## 2020-01-20 ENCOUNTER — Telehealth: Payer: Self-pay | Admitting: Hematology

## 2020-01-20 ENCOUNTER — Other Ambulatory Visit: Payer: Self-pay

## 2020-01-20 ENCOUNTER — Inpatient Hospital Stay: Payer: Medicaid Other

## 2020-01-20 ENCOUNTER — Inpatient Hospital Stay: Payer: Medicaid Other | Attending: Hematology | Admitting: Hematology

## 2020-01-20 VITALS — BP 154/83 | HR 68 | Temp 97.1°F | Resp 18 | Ht 64.0 in | Wt 151.6 lb

## 2020-01-20 DIAGNOSIS — Z923 Personal history of irradiation: Secondary | ICD-10-CM | POA: Diagnosis not present

## 2020-01-20 DIAGNOSIS — C50811 Malignant neoplasm of overlapping sites of right female breast: Secondary | ICD-10-CM | POA: Diagnosis not present

## 2020-01-20 DIAGNOSIS — F17210 Nicotine dependence, cigarettes, uncomplicated: Secondary | ICD-10-CM | POA: Insufficient documentation

## 2020-01-20 DIAGNOSIS — Z170 Estrogen receptor positive status [ER+]: Secondary | ICD-10-CM | POA: Diagnosis not present

## 2020-01-20 DIAGNOSIS — Z9011 Acquired absence of right breast and nipple: Secondary | ICD-10-CM | POA: Diagnosis not present

## 2020-01-20 DIAGNOSIS — C50412 Malignant neoplasm of upper-outer quadrant of left female breast: Secondary | ICD-10-CM | POA: Diagnosis not present

## 2020-01-20 DIAGNOSIS — C773 Secondary and unspecified malignant neoplasm of axilla and upper limb lymph nodes: Secondary | ICD-10-CM | POA: Insufficient documentation

## 2020-01-20 DIAGNOSIS — Z79811 Long term (current) use of aromatase inhibitors: Secondary | ICD-10-CM | POA: Diagnosis not present

## 2020-01-20 DIAGNOSIS — Z9221 Personal history of antineoplastic chemotherapy: Secondary | ICD-10-CM | POA: Diagnosis not present

## 2020-01-20 DIAGNOSIS — I10 Essential (primary) hypertension: Secondary | ICD-10-CM | POA: Diagnosis not present

## 2020-01-20 DIAGNOSIS — Z79899 Other long term (current) drug therapy: Secondary | ICD-10-CM | POA: Diagnosis not present

## 2020-01-20 LAB — CMP (CANCER CENTER ONLY)
ALT: 15 U/L (ref 0–44)
AST: 13 U/L — ABNORMAL LOW (ref 15–41)
Albumin: 3.8 g/dL (ref 3.5–5.0)
Alkaline Phosphatase: 64 U/L (ref 38–126)
Anion gap: 8 (ref 5–15)
BUN: 22 mg/dL (ref 8–23)
CO2: 25 mmol/L (ref 22–32)
Calcium: 9.7 mg/dL (ref 8.9–10.3)
Chloride: 107 mmol/L (ref 98–111)
Creatinine: 0.79 mg/dL (ref 0.44–1.00)
GFR, Est AFR Am: >60 mL/min (ref >60)
GFR, Estimated: >60 mL/min (ref >60)
Glucose, Bld: 96 mg/dL (ref 70–99)
Potassium: 3.8 mmol/L (ref 3.5–5.1)
Sodium: 140 mmol/L (ref 135–145)
Total Bilirubin: 0.5 mg/dL (ref 0.3–1.2)
Total Protein: 6.5 g/dL (ref 6.5–8.1)

## 2020-01-20 LAB — CBC WITH DIFFERENTIAL (CANCER CENTER ONLY)
Abs Immature Granulocytes: 0.01 10*3/uL (ref 0.00–0.07)
Basophils Absolute: 0 10*3/uL (ref 0.0–0.1)
Basophils Relative: 1 %
Eosinophils Absolute: 0.1 10*3/uL (ref 0.0–0.5)
Eosinophils Relative: 2 %
HCT: 41 % (ref 36.0–46.0)
Hemoglobin: 13.9 g/dL (ref 12.0–15.0)
Immature Granulocytes: 0 %
Lymphocytes Relative: 31 %
Lymphs Abs: 2 10*3/uL (ref 0.7–4.0)
MCH: 31.7 pg (ref 26.0–34.0)
MCHC: 33.9 g/dL (ref 30.0–36.0)
MCV: 93.4 fL (ref 80.0–100.0)
Monocytes Absolute: 0.4 10*3/uL (ref 0.1–1.0)
Monocytes Relative: 6 %
Neutro Abs: 4 10*3/uL (ref 1.7–7.7)
Neutrophils Relative %: 60 %
Platelet Count: 198 10*3/uL (ref 150–400)
RBC: 4.39 MIL/uL (ref 3.87–5.11)
RDW: 13.6 % (ref 11.5–15.5)
WBC Count: 6.5 10*3/uL (ref 4.0–10.5)
nRBC: 0 % (ref 0.0–0.2)

## 2020-01-20 NOTE — Telephone Encounter (Signed)
Scheduled per 08/12 los, patient received after visit summary and calender.

## 2020-01-22 ENCOUNTER — Encounter: Payer: Self-pay | Admitting: Hematology

## 2020-02-03 ENCOUNTER — Ambulatory Visit (INDEPENDENT_AMBULATORY_CARE_PROVIDER_SITE_OTHER): Payer: Medicaid Other | Admitting: Family Medicine

## 2020-02-03 ENCOUNTER — Other Ambulatory Visit: Payer: Self-pay

## 2020-02-03 ENCOUNTER — Encounter: Payer: Self-pay | Admitting: Family Medicine

## 2020-02-03 VITALS — BP 136/88 | HR 77 | Temp 98.3°F | Ht 64.0 in | Wt 148.0 lb

## 2020-02-03 DIAGNOSIS — I10 Essential (primary) hypertension: Secondary | ICD-10-CM | POA: Diagnosis not present

## 2020-02-03 DIAGNOSIS — R42 Dizziness and giddiness: Secondary | ICD-10-CM

## 2020-02-03 NOTE — Patient Instructions (Addendum)
°  I do recommend more calories spread throughout the day, including healthy snack or breakfast in am, and something at lunchtime,and continue to drink plenty of fluids throughout the day - goal of 64 ounces water per day. I also recommend meeting with nutritionist for weight management to make sure that is done safely.  Please follow up in next month to discuss dizziness further.  If that is still occurring I may recommend cardiology evaluation. Return to the clinic or go to the nearest emergency room if any of your symptoms worsen or new symptoms occur.  No med changes for now.   If you have lab work done today you will be contacted with your lab results within the next 2 weeks.  If you have not heard from [place] then please contact us. The fastest way to get your results is to register for My Chart.   IF you received an x-ray today, you will receive an invoice from [facility]. Please contact [facility] at [phone] with questions or concerns regarding your invoice.   IF you received labwork today, you will receive an invoice from [place]. Please contact [facility] at [phone] with questions or concerns regarding your invoice.   Our billing staff will not be able to assist you with questions regarding bills from these companies.  You will be contacted with the lab results as soon as they are available. The fastest way to get your results is to activate your My Chart account. Instructions are located on the last page of this paperwork. If you have not heard from [place] regarding the results in 2 weeks, please contact this office.

## 2020-02-03 NOTE — Progress Notes (Signed)
Subjective:  Patient ID: Darlene Beasley, female    DOB: 14-Jun-1955  Age: 64 y.o. MRN: [id number]  CC:  Chief Complaint  Patient presents with  . Follow-up    on hypertension. pt reports her BP seems to always be high. pt states it's better than it use tobe, but still in the high range.Pt reports feeling dizzy and faint at times.   . medication question    Pt would like the providers opion on her taking Ketogenic. pt is on a keto diet and just would like to know what the providers thoughts are about this medication.    HPI Darlene Beasley presents for   Hypertension: Elevated at May 26 visit, amlodipine was increased to 5 mg daily. Reports occasional dizziness, feeling faint at times.  No true syncope. Dizziness comes and goes. Not daily. Few times per week. Noted at work on day after day off. Notices b/t 3pm and 5pm. No alcohol. Has been on keto diet in past, more strict keto diet recently trying to lose a few pounds. No chest pain/dyspnea.  No melena/hematochezia.  CBC normal 01/20/20. CMP reassuring.  Drinking water during day - about 48 ounces per day. 2 cups of coffee in am, no breakfast. occasional snack during the day. Typical meal at night.   Only eating 1 meal per day for years.  Purchased "ketogenic formula" but has not taken.  Declines meeting with a nutritionist at this time.  Taking D3, calcium 2 per day, and magnesium.  Tobacco - 1/2 ppd.    Home readings: up and down.  BP Readings from Last 3 Encounters:  02/03/20 136/88  01/20/20 (!) 154/83  11/03/19 (!) 146/78   Lab Results  Component Value Date   CREATININE 0.79 01/20/2020     Patient Active Problem List   Diagnosis Date Noted  . Genetic testing 11/14/2017  . 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 Adopted   . Port-A-Cath in place 10/22/2017  . Cancer of overlapping sites of right female breast ([place]) 10/08/2017  . Malignant neoplasm of left breast in female, estrogen receptor positive ([place])  10/08/2017   Past Medical History:  Diagnosis Date  . Adopted   . Arthritis   . breast ca dx'd 08/2017  . Genetic testing 11/14/2017   Multi-Cancer panel (83 genes) @ [facility] - Pathogenic mutation in the MITF gene  . GERD (gastroesophageal reflux disease)   . 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 Personal history of chemotherapy   . Personal history of radiation therapy    Past Surgical History:  Procedure Laterality Date  . BREAST LUMPECTOMY Left 04/03/2018  . BREAST LUMPECTOMY WITH RADIOACTIVE SEED AND SENTINEL LYMPH NODE BIOPSY Left 04/03/2018   Procedure: LEFT BREAST LUMPECTOMY WITH RADIOACTIVE SEED AND LEFT SENTINEL LYMPH NODE BIOPSY;  Surgeon: [name], MD;  Location: [facility];  Service: General;  Laterality: Left;  . IR IMAGING GUIDED PORT INSERTION  10/20/2017  . IR US GUIDE VASC ACCESS LEFT  10/20/2017  . MASTECTOMY Right 04/03/2018  . MASTECTOMY MODIFIED RADICAL Right 04/03/2018   Procedure: MASTECTOMY MODIFIED RADICAL;  Surgeon: [name], MD;  Location: [facility];  Service: General;  Laterality: Right;  . PORT-A-CATH REMOVAL N/A 04/03/2018   Procedure: REMOVAL PORT-A-CATH;  Surgeon: [name], MD;  Location: [facility];  Service: General;  Laterality: N/A;  . TONSILLECTOMY Bilateral   . TUBAL LIGATION  Allergies  Allergen Reactions  . Bee Venom    Prior to Admission medications   Medication Sig Start Date End Date Taking? Authorizing Provider  amLODipine (NORVASC) 5 MG tablet Take 1 tablet (5 mg total) by mouth daily. 11/03/19  Yes [name], MD  b complex vitamins tablet Take 1 tablet by mouth daily.   Yes [provider]  calcium carbonate (OS-CAL - DOSED IN MG OF ELEMENTAL CALCIUM) 1250 (500 Ca) MG tablet Take 1 tablet by mouth.   Yes [provider]  calcium-vitamin D (CALCIUM 500/D) 500-200 MG-UNIT tablet  Take 1 tablet by mouth.   Yes [provider]  letrozole (FEMARA) 2.5 MG tablet Take 1 tablet (2.5 mg total) by mouth daily. 07/08/19  Yes [name], MD  magnesium chloride (SLOW-MAG) 64 MG TBEC SR tablet Take by mouth.   Yes [provider]  magnesium citrate SOLN Take 1 Bottle by mouth once.   Yes [provider]  Nutritional Supplements (VITAMIN D BOOSTER PO) Take by mouth.   Yes [provider]  Ubiquinol (UBQH) 100 MG CAPS Take by mouth.   Yes [provider]   Social History   Socioeconomic History  . Marital status: Significant Other    Spouse name: Not on file  . Number of children: 1  . Years of education: Not on file  . Highest education level: Not on file  Occupational History  . Not on file  Tobacco Use  . Smoking status: Current Every Day Smoker    Packs/day: 0.50    Years: 51.00    Pack years: 25.50    Types: Cigarettes  . Smokeless tobacco: Never Used  . Tobacco comment: Working on quitting.  Vaping Use  . Vaping Use: Never used  Substance and Sexual Activity  . Alcohol use: No    Comment: stopped 08/2017   . Drug use: Yes    Types: Marijuana    Comment: daily, since age 6   . Sexual activity: Not Currently    Birth control/protection: Sponge, Surgical  Other Topics Concern  . Not on file  Social History Narrative  . Not on file   Social Determinants of Health   Financial Resource Strain:   . Difficulty of Paying Living Expenses: Not on file  Food Insecurity:   . Worried About Charity fundraiser in the Last Year: Not on file  . Ran Out of Food in the Last Year: Not on file  Transportation Needs:   . Lack of Transportation (Medical): Not on file  . Lack of Transportation (Non-Medical): Not on file  Physical Activity:   . Days of Exercise per Week: Not on file  . Minutes of Exercise per Session: Not on file  Stress:   . Feeling of Stress : Not on file  Social Connections:   . Frequency of Communication  with Friends and Family: Not on file  . Frequency of Social Gatherings with Friends and Family: Not on file  . Attends Religious Services: Not on file  . Active Member of Clubs or Organizations: Not on file  . Attends Archivist Meetings: Not on file  . Marital Status: Not on file  Intimate Partner Violence:   . Fear of Current or Ex-Partner: Not on file  . Emotionally Abused: Not on file  . Physically Abused: Not on file  . Sexually Abused: Not on file    Review of Systems  Per HPI.    Objective:  Vitals:   02/03/20 0904 02/03/20 0917  BP: (!) 151/93 136/88  Pulse: 77   Temp: 98.3 F (36.8 C)   TempSrc: Temporal   SpO2: 95%   Weight: 148 lb (67.1 kg)   Height: 5\' 4"  (1.626 m)      Physical Exam Vitals reviewed.  Constitutional:      Appearance: She is well-developed.  HENT:     Head: Normocephalic and atraumatic.  Eyes:     Conjunctiva/sclera: Conjunctivae normal.     Pupils: Pupils are equal, round, and reactive to light.  Neck:     Vascular: No carotid bruit.  Cardiovascular:     Rate and Rhythm: Normal rate and regular rhythm.     Heart sounds: Normal heart sounds.  Pulmonary:     Effort: Pulmonary effort is normal.     Breath sounds: Normal breath sounds.  Abdominal:     Palpations: Abdomen is soft. There is no pulsatile mass.     Tenderness: There is no abdominal tenderness.  Skin:    General: Skin is warm and dry.  Neurological:     Mental Status: She is alert and oriented to person, place, and time.  Psychiatric:        Behavior: Behavior normal.     EKG: Sinus rhythm.  Rate 66.  No prior EKG available for review.  Low voltage.  Possible poor R wave progression.  No apparent acute findings.  Assessment & Plan:  Ashanti Ratti is a 64 y.o. female . Essential hypertension  -Improved on higher dose amlodipine.  Continue same.  Recent labs noted.  Episode of dizziness - Plan: EKG 12-Lead  -Episodic, appears to be mostly in the  afternoon when at work.  Based on discussion of her caloric intake, I think that that may be a big contributor.  She is on ketogenic diet, recommended she discuss this with nutritionist, referral was declined.  Recommended she have some form of healthy snack or breakfast early in the morning as well as some calories during the middle of the day as that likely is contributing to afternoon symptoms especially at work.  Continue hydration with goal of 64 ounces of fluids per day.  Recheck in 1 month with ER precautions given prior to that time if worsening  No orders of the defined types were placed in this encounter.  Patient Instructions    I do recommend more calories spread throughout the day, including healthy snack or breakfast in am, and something at lunchtime,and continue to drink plenty of fluids throughout the day - goal of 64 ounces water per day. I also recommend meeting with nutritionist for weight management to make sure that is done safely.  Please follow up in next month to discuss dizziness further.  Return to the clinic or go to the nearest emergency room if any of your symptoms worsen or new symptoms occur.  No med changes for now.      If you have lab work done today you will be contacted with your lab results within the next 2 weeks.  If you have not heard from [place] then please contact us. The fastest way to get your results is to register for My Chart.   IF you received an x-ray today, you will receive an invoice from [facility]. Please contact [facility] at [phone] with questions or concerns regarding your invoice.   IF you received labwork today, you will receive an invoice from [place]. Please contact [facility] at [phone]  with questions or concerns regarding your invoice.   Our billing staff will not be able to assist you with questions regarding bills from these companies.  You will be contacted with the lab results as soon as they are  available. The fastest way to get your results is to activate your My Chart account. Instructions are located on the last page of this paperwork. If you have not heard from [place] regarding the results in 2 weeks, please contact this office.         Signed, [name], MD [facility]

## 2020-03-16 ENCOUNTER — Other Ambulatory Visit: Payer: Self-pay

## 2020-03-16 ENCOUNTER — Ambulatory Visit: Payer: Medicare Other | Admitting: Family Medicine

## 2020-03-16 ENCOUNTER — Ambulatory Visit (INDEPENDENT_AMBULATORY_CARE_PROVIDER_SITE_OTHER): Payer: Medicaid Other | Admitting: Emergency Medicine

## 2020-03-16 ENCOUNTER — Encounter: Payer: Self-pay | Admitting: Emergency Medicine

## 2020-03-16 VITALS — BP 148/83 | HR 70 | Temp 97.9°F | Resp 16 | Ht 64.0 in | Wt 145.0 lb

## 2020-03-16 DIAGNOSIS — I10 Essential (primary) hypertension: Secondary | ICD-10-CM

## 2020-03-16 DIAGNOSIS — R42 Dizziness and giddiness: Secondary | ICD-10-CM

## 2020-03-16 NOTE — Progress Notes (Signed)
Darlene Beasley 64 y.o.   Chief Complaint  Patient presents with  . Dizziness    follow up 1 month, per patient she feels better    HISTORY OF PRESENT ILLNESS: This is a 64 y.o. female here for 1 month follow-up visit with Dr. Carlota Raspberry complaining of dizziness.  Feels much better today. Has no complaints or medical concerns today.  HPI   Prior to Admission medications   Medication Sig Start Date End Date Taking? Authorizing Provider  amLODipine (NORVASC) 5 MG tablet Take 1 tablet (5 mg total) by mouth daily. 11/03/19  Yes [name], MD  b complex vitamins tablet Take 1 tablet by mouth daily.   Yes [provider]  calcium carbonate (OS-CAL - DOSED IN MG OF ELEMENTAL CALCIUM) 1250 (500 Ca) MG tablet Take 1 tablet by mouth.   Yes [provider]  calcium-vitamin D (CALCIUM 500/D) 500-200 MG-UNIT tablet Take 1 tablet by mouth.   Yes [provider]  letrozole (FEMARA) 2.5 MG tablet Take 1 tablet (2.5 mg total) by mouth daily. 07/08/19  Yes [name], MD  magnesium chloride (SLOW-MAG) 64 MG TBEC SR tablet Take by mouth.   Yes [provider]  magnesium citrate SOLN Take 1 Bottle by mouth once.   Yes [provider]  Nutritional Supplements (VITAMIN D BOOSTER PO) Take by mouth.   Yes [provider]  Ubiquinol (UBQH) 100 MG CAPS Take by mouth.   Yes [provider]    Allergies  Allergen Reactions  . Bee Venom     Patient Active Problem List   Diagnosis Date Noted  . Genetic testing 11/14/2017  . 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 Adopted   . Port-A-Cath in place 10/22/2017  . Cancer of overlapping sites of right female breast ([facility]) 10/08/2017  . Malignant neoplasm of left breast in female, estrogen receptor positive ([facility]) 10/08/2017    Past Medical History:  Diagnosis Date  . Adopted   . Arthritis   . breast ca dx'd 08/2017  . Genetic testing 11/14/2017   Multi-Cancer panel (83 genes) @  [facility] - Pathogenic mutation in the MITF gene  . GERD (gastroesophageal reflux disease)   . 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 Personal history of chemotherapy   . Personal history of radiation therapy     Past Surgical History:  Procedure Laterality Date  . BREAST LUMPECTOMY Left 04/03/2018  . BREAST LUMPECTOMY WITH RADIOACTIVE SEED AND SENTINEL LYMPH NODE BIOPSY Left 04/03/2018   Procedure: LEFT BREAST LUMPECTOMY WITH RADIOACTIVE SEED AND LEFT SENTINEL LYMPH NODE BIOPSY;  Surgeon: [name], MD;  Location: [facility];  Service: General;  Laterality: Left;  . IR IMAGING GUIDED PORT INSERTION  10/20/2017  . IR US GUIDE VASC ACCESS LEFT  10/20/2017  . MASTECTOMY Right 04/03/2018  . MASTECTOMY MODIFIED RADICAL Right 04/03/2018   Procedure: MASTECTOMY MODIFIED RADICAL;  Surgeon: [name], MD;  Location: [facility];  Service: General;  Laterality: Right;  . PORT-A-CATH REMOVAL N/A 04/03/2018   Procedure: REMOVAL PORT-A-CATH;  Surgeon: [name], MD;  Location: [place] [facility];  Service: General;  Laterality: N/A;  . TONSILLECTOMY Bilateral   . TUBAL LIGATION      Social History   Socioeconomic History  . Marital status: Significant Other    Spouse name: Not on file  . Number of children: 1  . Years of education: Not  on file  . Highest education level: Not on file  Occupational History  . Not on file  Tobacco Use  . Smoking status: Current Every Day Smoker    Packs/day: 0.50    Years: 51.00    Pack years: 25.50    Types: Cigarettes  . Smokeless tobacco: Never Used  . Tobacco comment: Working on quitting.  Vaping Use  . Vaping Use: Never used  Substance and Sexual Activity  . Alcohol use: No    Comment: stopped 08/2017   . Drug use: Yes    Types: Marijuana    Comment: daily, since age 29   . Sexual activity: Not Currently    Birth  control/protection: Sponge, Surgical  Other Topics Concern  . Not on file  Social History Narrative  . Not on file   Social Determinants of Health   Financial Resource Strain:   . Difficulty of Paying Living Expenses: Not on file  Food Insecurity:   . Worried About Charity fundraiser in the Last Year: Not on file  . Ran Out of Food in the Last Year: Not on file  Transportation Needs:   . Lack of Transportation (Medical): Not on file  . Lack of Transportation (Non-Medical): Not on file  Physical Activity:   . Days of Exercise per Week: Not on file  . Minutes of Exercise per Session: Not on file  Stress:   . Feeling of Stress : Not on file  Social Connections:   . Frequency of Communication with Friends and Family: Not on file  . Frequency of Social Gatherings with Friends and Family: Not on file  . Attends Religious Services: Not on file  . Active Member of Clubs or Organizations: Not on file  . Attends Archivist Meetings: Not on file  . Marital Status: Not on file  Intimate Partner Violence:   . Fear of Current or Ex-Partner: Not on file  . Emotionally Abused: Not on file  . Physically Abused: Not on file  . Sexually Abused: Not on file    Family History  Adopted: Yes  Problem Relation Age of Onset  . Congestive Heart Failure Mother   . Congestive Heart Failure Father      Review of Systems  Constitutional: Negative.  Negative for chills and fever.  HENT: Negative.  Negative for congestion and sore throat.   Eyes: Negative for blurred vision and double vision.  Respiratory: Negative.  Negative for cough and shortness of breath.   Cardiovascular: Negative.  Negative for chest pain and palpitations.  Gastrointestinal: Negative.  Negative for abdominal pain, diarrhea, nausea and vomiting.  Genitourinary: Negative.  Negative for dysuria and hematuria.  Musculoskeletal: Negative.   Skin: Negative.  Negative for rash.  Neurological: Negative.  Negative for  dizziness, sensory change, focal weakness, loss of consciousness and headaches.  All other systems reviewed and are negative.   Today's Vitals   03/16/20 1049  BP: (!) 148/83  Pulse: 70  Resp: 16  Temp: 97.9 F (36.6 C)  TempSrc: Temporal  SpO2: 96%  Weight: 145 lb (65.8 kg)  Height: 5\' 4"  (1.626 m)   Body mass index is 24.89 kg/m. Wt Readings from Last 3 Encounters:  03/16/20 145 lb (65.8 kg)  02/03/20 148 lb (67.1 kg)  01/20/20 151 lb 9.6 oz (68.8 kg)    Physical Exam Vitals reviewed.  Constitutional:      Appearance: Normal appearance.  HENT:     Head: Normocephalic.  Mouth/Throat:     Mouth: Mucous membranes are moist.     Pharynx: Oropharynx is clear.  Eyes:     Extraocular Movements: Extraocular movements intact.     Conjunctiva/sclera: Conjunctivae normal.     Pupils: Pupils are equal, round, and reactive to light.  Neck:     Vascular: No carotid bruit.  Cardiovascular:     Rate and Rhythm: Normal rate and regular rhythm.     Pulses: Normal pulses.     Heart sounds: Normal heart sounds.  Pulmonary:     Effort: Pulmonary effort is normal.     Breath sounds: Normal breath sounds.  Musculoskeletal:        General: Normal range of motion.     Cervical back: Normal range of motion and neck supple. No tenderness.  Lymphadenopathy:     Cervical: No cervical adenopathy.  Skin:    General: Skin is warm and dry.     Capillary Refill: Capillary refill takes less than 2 seconds.  Neurological:     General: No focal deficit present.     Mental Status: She is alert and oriented to person, place, and time.  Psychiatric:        Mood and Affect: Mood normal.        Behavior: Behavior normal.      ASSESSMENT & PLAN: Clinically stable.  No medical concerns identified during this visit.  Continue present medication for hypertension.  Follow-up with Dr. Carlota Raspberry as needed or as scheduled. Darlene Beasley was seen today for dizziness.  Diagnoses and all orders for this  visit:  Episode of dizziness Comments: Improved  Essential hypertension    Patient Instructions       If you have lab work done today you will be contacted with your lab results within the next 2 weeks.  If you have not heard from [place] then please contact us. The fastest way to get your results is to register for My Chart.   IF you received an x-ray today, you will receive an invoice from [facility]. Please contact [facility] at [phone] with questions or concerns regarding your invoice.   IF you received labwork today, you will receive an invoice from [facility]. Please contact [facility] at [phone] with questions or concerns regarding your invoice.   Our billing staff will not be able to assist you with questions regarding bills from these companies.  You will be contacted with the lab results as soon as they are available. The fastest way to get your results is to activate your My Chart account. Instructions are located on the last page of this paperwork. If you have not heard from [place] regarding the results in 2 weeks, please contact this office.     Health Maintenance After Age 32 After age 3, you are at a higher risk for certain long-term diseases and infections as well as injuries from falls. Falls are a major cause of broken bones and head injuries in people who are older than age 49. Getting regular preventive care can help to keep you healthy and well. Preventive care includes getting regular testing and making lifestyle changes as recommended by your health care provider. Talk with your health care provider about:  Which screenings and tests you should have. A screening is a test that checks for a disease when you have no symptoms.  A diet and exercise plan that is right for you. What should I know about screenings and tests to prevent falls? Screening and testing  are the best ways to find a health problem early. Early diagnosis and treatment give  you the best chance of managing medical conditions that are common after age 32. Certain conditions and lifestyle choices may make you more likely to have a fall. Your health care provider may recommend:  Regular vision checks. Poor vision and conditions such as cataracts can make you more likely to have a fall. If you wear glasses, make sure to get your prescription updated if your vision changes.  Medicine review. Work with your health care provider to regularly review all of the medicines you are taking, including over-the-counter medicines. Ask your health care provider about any side effects that may make you more likely to have a fall. Tell your health care provider if any medicines that you take make you feel dizzy or sleepy.  Osteoporosis screening. Osteoporosis is a condition that causes the bones to get weaker. This can make the bones weak and cause them to break more easily.  Blood pressure screening. Blood pressure changes and medicines to control blood pressure can make you feel dizzy.  Strength and balance checks. Your health care provider may recommend certain tests to check your strength and balance while standing, walking, or changing positions.  Foot health exam. Foot pain and numbness, as well as not wearing proper footwear, can make you more likely to have a fall.  Depression screening. You may be more likely to have a fall if you have a fear of falling, feel emotionally low, or feel unable to do activities that you used to do.  Alcohol use screening. Using too much alcohol can affect your balance and may make you more likely to have a fall. What actions can I take to lower my risk of falls? General instructions  Talk with your health care provider about your risks for falling. Tell your health care provider if: ? You fall. Be sure to tell your health care provider about all falls, even ones that seem minor. ? You feel dizzy, sleepy, or off-balance.  Take over-the-counter  and prescription medicines only as told by your health care provider. These include any supplements.  Eat a healthy diet and maintain a healthy weight. A healthy diet includes low-fat dairy products, low-fat (lean) meats, and fiber from whole grains, beans, and lots of fruits and vegetables. Home safety  Remove any tripping hazards, such as rugs, cords, and clutter.  Install safety equipment such as grab bars in bathrooms and safety rails on stairs.  Keep rooms and walkways well-lit. Activity   Follow a regular exercise program to stay fit. This will help you maintain your balance. Ask your health care provider what types of exercise are appropriate for you.  If you need a cane or walker, use it as recommended by your health care provider.  Wear supportive shoes that have nonskid soles. Lifestyle  Do not drink alcohol if your health care provider tells you not to drink.  If you drink alcohol, limit how much you have: ? 0-1 drink a day for women. ? 0-2 drinks a day for men.  Be aware of how much alcohol is in your drink. In the [place] one drink equals one typical bottle of beer (12 oz), one-half glass of wine (5 oz), or one shot of hard liquor (1 oz).  Do not use any products that contain nicotine or tobacco, such as cigarettes and e-cigarettes. If you need help quitting, ask your health care provider. Summary  Having a healthy lifestyle  and getting preventive care can help to protect your health and wellness after age 14.  Screening and testing are the best way to find a health problem early and help you avoid having a fall. Early diagnosis and treatment give you the best chance for managing medical conditions that are more common for people who are older than age 58.  Falls are a major cause of broken bones and head injuries in people who are older than age 35. Take precautions to prevent a fall at home.  Work with your health care provider to learn what changes you can make to  improve your health and wellness and to prevent falls. This information is not intended to replace advice given to you by your health care provider. Make sure you discuss any questions you have with your health care provider. Document Revised: 09/17/2018 Document Reviewed: 04/09/2017 Elsevier Patient Education  2020 Elsevier Inc.     Agustina Caroli, MD [facility]

## 2020-03-16 NOTE — Patient Instructions (Addendum)
   If you have lab work done today you will be contacted with your lab results within the next 2 weeks.  If you have not heard from us then please contact us. The fastest way to get your results is to register for My Chart.   IF you received an x-ray today, you will receive an invoice from [facility]. Please contact  Radiology at [phone] with questions or concerns regarding your invoice.   IF you received labwork today, you will receive an invoice from [facility]. Please contact [facility] at [phone] with questions or concerns regarding your invoice.   Our billing staff will not be able to assist you with questions regarding bills from these companies.  You will be contacted with the lab results as soon as they are available. The fastest way to get your results is to activate your My Chart account. Instructions are located on the last page of this paperwork. If you have not heard from us regarding the results in 2 weeks, please contact this office.     Health Maintenance After Age 65 After age 65, you are at a higher risk for certain long-term diseases and infections as well as injuries from falls. Falls are a major cause of broken bones and head injuries in people who are older than age 65. Getting regular preventive care can help to keep you healthy and well. Preventive care includes getting regular testing and making lifestyle changes as recommended by your health care provider. Talk with your health care provider about:  Which screenings and tests you should have. A screening is a test that checks for a disease when you have no symptoms.  A diet and exercise plan that is right for you. What should I know about screenings and tests to prevent falls? Screening and testing are the best ways to find a health problem early. Early diagnosis and treatment give you the best chance of managing medical conditions that are common after age 65. Certain conditions and  lifestyle choices may make you more likely to have a fall. Your health care provider may recommend:  Regular vision checks. Poor vision and conditions such as cataracts can make you more likely to have a fall. If you wear glasses, make sure to get your prescription updated if your vision changes.  Medicine review. Work with your health care provider to regularly review all of the medicines you are taking, including over-the-counter medicines. Ask your health care provider about any side effects that may make you more likely to have a fall. Tell your health care provider if any medicines that you take make you feel dizzy or sleepy.  Osteoporosis screening. Osteoporosis is a condition that causes the bones to get weaker. This can make the bones weak and cause them to break more easily.  Blood pressure screening. Blood pressure changes and medicines to control blood pressure can make you feel dizzy.  Strength and balance checks. Your health care provider may recommend certain tests to check your strength and balance while standing, walking, or changing positions.  Foot health exam. Foot pain and numbness, as well as not wearing proper footwear, can make you more likely to have a fall.  Depression screening. You may be more likely to have a fall if you have a fear of falling, feel emotionally low, or feel unable to do activities that you used to do.  Alcohol use screening. Using too much alcohol can affect your balance and may make you more likely to   have a fall. What actions can I take to lower my risk of falls? General instructions  Talk with your health care provider about your risks for falling. Tell your health care provider if: ? You fall. Be sure to tell your health care provider about all falls, even ones that seem minor. ? You feel dizzy, sleepy, or off-balance.  Take over-the-counter and prescription medicines only as told by your health care provider. These include any  supplements.  Eat a healthy diet and maintain a healthy weight. A healthy diet includes low-fat dairy products, low-fat (lean) meats, and fiber from whole grains, beans, and lots of fruits and vegetables. Home safety  Remove any tripping hazards, such as rugs, cords, and clutter.  Install safety equipment such as grab bars in bathrooms and safety rails on stairs.  Keep rooms and walkways well-lit. Activity   Follow a regular exercise program to stay fit. This will help you maintain your balance. Ask your health care provider what types of exercise are appropriate for you.  If you need a cane or walker, use it as recommended by your health care provider.  Wear supportive shoes that have nonskid soles. Lifestyle  Do not drink alcohol if your health care provider tells you not to drink.  If you drink alcohol, limit how much you have: ? 0-1 drink a day for women. ? 0-2 drinks a day for men.  Be aware of how much alcohol is in your drink. In the [place] one drink equals one typical bottle of beer (12 oz), one-half glass of wine (5 oz), or one shot of hard liquor (1 oz).  Do not use any products that contain nicotine or tobacco, such as cigarettes and e-cigarettes. If you need help quitting, ask your health care provider. Summary  Having a healthy lifestyle and getting preventive care can help to protect your health and wellness after age 65.  Screening and testing are the best way to find a health problem early and help you avoid having a fall. Early diagnosis and treatment give you the best chance for managing medical conditions that are more common for people who are older than age 65.  Falls are a major cause of broken bones and head injuries in people who are older than age 65. Take precautions to prevent a fall at home.  Work with your health care provider to learn what changes you can make to improve your health and wellness and to prevent falls. This information is not intended  to replace advice given to you by your health care provider. Make sure you discuss any questions you have with your health care provider. Document Revised: 09/17/2018 Document Reviewed: 04/09/2017 Elsevier Patient Education  2020 Elsevier Inc.  

## 2020-05-24 ENCOUNTER — Other Ambulatory Visit: Payer: Self-pay | Admitting: Family Medicine

## 2020-05-24 DIAGNOSIS — I10 Essential (primary) hypertension: Secondary | ICD-10-CM

## 2020-07-17 NOTE — Progress Notes (Signed)
Darlene Beasley   [phone] [phone]   Clinic Follow up Note   Patient Care Team: [name], MD as PCP - General (Family Medicine) [name], MD as Consulting Physician (General Surgery) [name], MD as Consulting Physician (Hematology) Kyung Rudd, MD as Consulting Physician (Radiation Oncology) Alla Feeling, NP as Nurse Practitioner (Nurse Practitioner)  Date of Service:  07/20/2020  CHIEF COMPLAINT: F/u onbilateralbreast cancer  SUMMARY OF ONCOLOGIC HISTORY: Oncology History Overview Note  Cancer Staging Cancer of overlapping sites of right female breast [facility]) Staging form: Breast, AJCC 8th Edition - Clinical stage from 09/23/2017: Stage IIIA (cT3, cN1, cM0, G2, ER+, PR-, HER2-) - Signed by [name], MD on 10/09/2017 - Pathologic stage from 04/03/2018: No Stage Recommended (ypT3, pN2a, cM0, G3, ER+, PR-, HER2-) - Signed by [name], MD on 05/03/2018  Malignant neoplasm of left breast in female, estrogen receptor positive ([place]) Staging form: Breast, AJCC 8th Edition - Clinical: Stage IA (cT1b, cN0, cM0, G1, ER+, PR+, HER2-) - Unsigned - Pathologic stage from 04/03/2018: No Stage Recommended (ypT1b, pN0, cM0, G1, ER+, PR+, HER2-) - Signed by [name], MD on 05/03/2018     Cancer of overlapping sites of right female breast ([place])  09/09/2017 Mammogram   IMPRESSION: 1. Highly suspicious large right breast mass involving the outer and upper breast extending from approximately 9 o'clock through 12-1 o'clock, maximum measurement approximating 7.5 cm. The mass involves the right nipple, accounting for nipple retraction. Architectural distortion, microcalcifications and diffuse trabecular thickening and skin thickening are associated with the large mass, raising the possibility of this representing an inflammatory cancer. 2. Satellite masses separate from the dominant mass involving the lower outer quadrant and the upper inner quadrant  of the right breast, measured above. 3. Two adjacent pathologic right axillary lymph nodes. 4. Indeterminate 0.9 cm solid mass involving the upper outer quadrant of the left breast which accounts for a mammographic finding. 5. No pathologic left axillary lymphadenopathy.   09/09/2017 Breast US   Targeted right breast ultrasound is performed, showing a very large hypoechoic mass with irregular margins extending from the approximate 9 o'clock position through the 12 to 1 o'clock position. On the CC gait image, the mass measures maximally approximately 7.5 cm. The mass has irregular margins, demonstrates acoustic shadowing, and demonstrates internal power Doppler flow. The mass extends into the retroareolar region and involves the nipple, accounting for the nipple retraction.  Separate from the dominant mass at the 7 o'clock position approximately 4 cm from nipple is a hypoechoic antiparallel mass with irregular margins measuring approximately 1.4 x 2.7 x 1.7 cm.  Separate from the dominant mass at the 1 o'clock position approximately 2 cm from nipple is a hypoechoic mass with irregular margins measuring approximately 1.7 x 0.5 x 1.6 cm. Both of these masses also demonstrate internal power Doppler flow.  Sonographic evaluation of the right axilla demonstrates 2 adjacent pathologic lymph nodes, the larger measuring approximately 2.5 x 2.5 x 2.8 cm, the smaller measuring approximately 2.0 x 0.8 x 2.7 cm.   09/23/2017 Initial Biopsy   Diagnosis 1. Breast, left, needle core biopsy, 2 o'clock - INVASIVE DUCTAL CARCINOMA. - SEE COMMENT. 2. Breast, right, needle core biopsy, 11:30 o'clock - INVASIVE MAMMARY CARCINOMA. - SEE COMMENT. 3. Lymph node, needle/core biopsy, right axilla - METASTATIC MAMMARY CARCINOMA IN 1 OF 1 LYMPH NODE (1/1). Microscopic Comment 1. There is a small focus of grade I invasive ductal carcinoma. A complete breast prognostic profile  will be attempted and the results reported  separately. The results were called to the [place] on 09/24/2017. 2. The carcinoma appears grade II. An E-Cadherin stain and a breast prognostic profile will be performed on part 2 and the results reported separately. (JBK:kh 09-24-17) [name]  1. HER2 Negative by FISH Estrogen receptor: 100% positive, strong staining Progesterone receptor: 80% positive, strong staining Ki67: 5%  2. HER2 Negative by FISH Estrogen receptor: 100% positive, strong staining Progesterone receptor: 0%, negative  Ki67: 30% 2. The tumor cells are strongly positive for E-cadherin, supporting a ductal phenotype   09/23/2017 Cancer Staging   Staging form: Breast, AJCC 8th Edition - Clinical stage from 09/23/2017: Stage IIIA (cT3, cN1, cM0, G2, ER+, PR-, HER2-) - Signed by [name], MD on 10/09/2017   10/07/2017 Breast MRI   IMPRESSION: 1. Biopsy proven malignancy involving all 4 quadrants of the right breast, although predominantly located in the anterior third of the central upper right breast measures 6.8 x 9.6 x 7.2 cm.  2. Five pathologic right axillary lymph nodes, compatible with biopsy proven axillary metastases.  3. Small mass with associated biopsy related changes in the upper-outer left breast at site of known malignancy measures 0.9 x 0.8 x 0.7 cm. No abnormal lymph nodes seen in the left axilla.  RECOMMENDATION: Treatment plan for known bilateral breast malignancy.  BI-RADS CATEGORY  6: Known biopsy-proven malignancy.   10/08/2017 Initial Diagnosis   Cancer of overlapping sites of right female breast ([place])   10/13/2017 Pathology Results   Diagnosis 1. Breast, right, needle core biopsy, 1 o'clock - INVASIVE DUCTAL CARCINOMA. - SEE COMMENT. 2. Breast, right, needle core biopsy, 7 o'clock - INVASIVE DUCTAL CARCINOMA. - SEE COMMENT. Microscopic Comment 1. and 2. The carcinoma in parts 1 and 2 is morphologically similar and appears grade II. Because breast prognostic profiles  were performed on the patients previous case, [id number], it will not be repeated on the current case unless requested.    10/17/2017 Imaging   CT CAP IMPRESSION: 1. Infiltrative right breast mass with overlying skin thickening is identified compatible with known breast cancer. 2. Enlarged right axillary lymph nodes compatible with metastatic adenopathy. 3. No additional sites of disease identified within the chest and no evidence for metastasis to the abdomen or pelvis. 4. Aortic atherosclerosis and 3 vessel coronary artery atherosclerotic calcifications. Aortic Atherosclerosis (ICD10-I70.0).   10/17/2017 Imaging   Bone Scan FINDINGS: There are no foci of increased or decreased radiotracer uptake to suggest osseous metastatic disease. 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 There is degenerative type uptake within both shoulders and the left ankle.  Normal physiologic activity is identified within the kidneys and urinary bladder.  IMPRESSION: No evidence of osseous metastatic disease.   10/22/2017 -  Chemotherapy   AC q2 weeks x4 cycles starting 10/22/17-12/03/17, followed by weekly taxol x12 12/17/17-03/11/18   11/14/2017 Genetic Testing   The genes that were analyzed were the 83 genes on [facility]'s Multi-Cancer panel (ALK, APC, ATM, AXIN2, BAP1, BARD1, BLM, BMPR1A, BRCA1, BRCA2, BRIP1, CASR, CDC73, CDH1, CDK4, CDKN1B, CDKN1C, CDKN2A, CEBPA, CHEK2, CTNNA1, DICER1, DIS3L2, EGFR, EPCAM, FH, FLCN, GATA2, GPC3, GREM1, HOXB13, HRAS, KIT, MAX, MEN1, MET, MITF, MLH1, MSH2, MSH3, MSH6, MUTYH, NBN, NF1, NF2, NTHL1, PALB2, PDGFRA, PHOX2B, PMS2, POLD1, POLE, POT1, PRKAR1A, PTCH1, PTEN, RAD50, RAD51C, RAD51D, RB1, RECQL4, RET, RUNX1, SDHA, SDHAF2, SDHB, SDHC, SDHD, SMAD4, SMARCA4, SMARCB1, SMARCE1, STK11,  SUFU, TERC, TERT, TMEM127, TP53, TSC1, TSC2, VHL, WRN, WT1).  Testingrevealed a pathogenic mutation  in the MITF genecalled c.952G>A (p.Glu318Lys) and A Variant of Unknown Significance (VUS) was also detected: POLD1 c.301A>T (p.Ile101Phe).   12/16/2017 Breast US   IMPRESSION: 1. Stable to minimal decrease in size of multiple irregular masses throughout the right breast consistent with the patient's sites of biopsy proven malignancy. While the mammographic appearance is slightly less dense in this region, there is increased skin and trabecular thickening involving the entire right breast. 2. Stable appearance of morphologically abnormal right axillary lymph nodes, consistent with biopsy proven metastatic disease. 3. Stable appearance of a left breast mass, consistent with biopsy proven malignancy.    04/03/2018 Cancer Staging   Staging form: Breast, AJCC 8th Edition - Pathologic stage from 04/03/2018: No Stage Recommended (ypT3, pN2a, cM0, G3, ER+, PR-, HER2-) - Signed by [name], MD on 05/03/2018   10/19/2018 -  Anti-estrogen oral therapy   Adjuvant letrozole 2.28m daily     Survivorship   Per LCira Rue NP    Malignant neoplasm of left breast in female, estrogen receptor positive ([place]  09/09/2017 Breast [place]  Targeted left breast ultrasound is performed, showing an oval parallel hypoechoic mass containing microcalcifications at the 2 o'clock position approximately 6 cm from nipple measuring approximately 0.9 x 0.5 x 0.9 cm, corresponding to the mammographic finding.  Sonographic evaluation of the left axilla demonstrates no pathologic lymphadenopathy.   09/23/2017 Initial Biopsy   Diagnosis 1. Breast, left, needle core biopsy, 2 o'clock - INVASIVE DUCTAL CARCINOMA. - SEE COMMENT. 2. Breast, right, needle core biopsy, 11:30 o'clock - INVASIVE MAMMARY CARCINOMA. - SEE COMMENT. 3. Lymph node, needle/core biopsy, right axilla - METASTATIC MAMMARY CARCINOMA IN 1 OF 1 LYMPH NODE (1/1). Microscopic Comment 1. There is a small focus of grade I invasive ductal carcinoma. A complete  breast prognostic profile will be attempted and the results reported separately. The results were called to the [place] 09/24/2017. 2. The carcinoma appears grade II. An E-Cadherin stain and a breast prognostic profile will be performed on part 2 and the results reported separately. (JBK:kh 09-24-17) [name]  1. HER2 Negative by FISH Estrogen receptor: 100% positive, strong staining Progesterone receptor: 80% positive, strong staining Ki67: 5%  2. HER2 Negative by FISH Estrogen receptor: 100% positive, strong staining Progesterone receptor: 0%, negative  Ki67: 30%   10/08/2017 Initial Diagnosis   Malignant neoplasm of left breast in female, estrogen receptor positive ([place]   12/16/2017 Breast [place]  IMPRESSION: 3. Stable appearance of a left breast mass, consistent with biopsy proven malignancy.    01/22/2018 Imaging   01/22/2018 MRI Bilateral Breast IMPRESSION: 1. Dominant enhancing mass with surrounding nodularity involving the central anterior third of the right breast now measures up to 6.4 x 6.7 x 7.6 cm (previously measured 6.8 x 9.6 x 7.2 cm). Persistent bulky right axillary lymphadenopathy, slightly decreased in size since prior MRI.  2. Biopsy proven left breast malignancy now measures 0.5 cm (previously measured 0.9 x 0.8 x 0.7 cm).    04/03/2018 Pathology Results   Diagnosis 1. Breast, lumpectomy, left with radioactive seed - RESIDUAL INVASIVE DUCTAL CARCINOMA STATUS POST NEOADJUVANT THERAPY (0.9 CM) - MARGINS UNINVOLVED BY CARCINOMA (0.2 CM; INFERIOR MARGIN) - CALCIFICATIONS ASSOCIATED WITH CARCINOMA - PREVIOUS BIOPSY SITE CHANGES PRESENT - SEE ONCOLOGY TABLE AND COMMENT BELOW 2. Lymph node, sentinel, biopsy, left axillary #1 - NO CARCINOMA IDENTIFIED IN ONE LYMPH NODE (0/1) -  SEE COMMENT 3. Lymph node, sentinel, biopsy, left axillary #2 - NO CARCINOMA IDENTIFIED IN ONE LYMPH NODE (0/1) - SEE COMMENT 4. Lymph node, sentinel, biopsy,  left - NO CARCINOMA IDENTIFIED IN ONE LYMPH NODE (0/1) - SEE COMMENT 5. Lymph node, sentinel, biopsy, left axillary #3 - NO CARCINOMA IDENTIFIED IN ONE LYMPH NODE (0/1) - SEE COMMENT 6. Breast, modified radical mastectomy , right - RESIDUAL INVASIVE DUCTAL CARCINOMA STATUS POST NEOADJUVANT THERAPY (8.5 CM) - EXTENSIVE LYMPHOVASCULAR SPACE INVASION PRESENT - METASTATIC CARCINOMA INVOLVING FIVE OF FIFTEEN LYMPH NODES WITH EXTRACAPSULAR EXTENSION (5/15) - MARGINS UNINVOLVED BY CARCINOMA - SEE ONCOLOGY TABLE AND COMMENT BELOW   04/03/2018 Receptors her2   The tumor cells are NEGATIVE for Her2 (1+). Estrogen Receptor: 95%, POSITIVE, STRONG STAINING INTENSITY Progesterone Receptor: 95%, POSITIVE, STRONG STAINING INTENSITY  The tumor cells are NEGATIVE for Her2 (1+). Estrogen Receptor: 100%, POSITIVE, STRONG STAINING INTENSITY Progesterone Receptor: 0%, NEGATIVE   04/03/2018 Cancer Staging   Staging form: Breast, AJCC 8th Edition - Pathologic stage from 04/03/2018: No Stage Recommended (ypT1b, pN0, cM0, G1, ER+, PR+, HER2-) - Signed by [name], MD on 05/03/2018   04/03/2018 Surgery   Left LEFT BREAST LUMPECTOMY WITH RADIOACTIVE SEED AND LEFT SENTINEL LYMPH NODE BIOPSY with [name], MD    05/20/2018 - 07/08/2018 Radiation Therapy   Daily adjuvant radiation treatment with Dr.Moody.    Radiation treatment dates:   05/20/2018 - 07/08/2018  Site/dose:   The patient initially received a dose of 50.4 Gy in 28 fractions to the right chest wall and supraclavicular region. This was delivered using a 3-D conformal, 4 field technique. The patient then received a boost to the mastectomy scar. This delivered an additional 10 Gy in 5 fractions using an en face electron field. The total dose was 60.4 Gy. At the same time, the patient received a dose of 50.4 Gy in 28 fractions to the left breast using whole-breast tangent fields. This was delivered using a 3-D conformal technique. The patient  then received a boost to the seroma. This delivered an additional 10 Gy in 5 fractions using 15E electrons with a special teletherapy technique. The total dose was 60.4 Gy.   10/19/2018 -  Anti-estrogen oral therapy   Letrozole 2.$RemoveBefo'5mg'lPyGIYlEgaz$  daily started 07/2018     Survivorship   Per Cira Rue, NP       CURRENT THERAPY:  Letrozole 2.$RemoveBefo'5mg'zcyoNazLpdK$  daily starting 10/2018  INTERVAL HISTORY:  Darlene Beasley is here for a follow up of b/l breast cancer. She was last seen by me 6 months ago. She presents to the clinic alone. She is doing well overall.  She works in [employer] 60 hours a week She has some pain in right thumb, no other join or back pain She is tolerating letrozole well, no side effects noticed   All other systems were reviewed with the patient and are negative.  MEDICAL HISTORY:  Past Medical History:  Diagnosis Date  . Adopted   . Arthritis   . breast ca dx'd 08/2017  . Genetic testing 11/14/2017   Multi-Cancer panel (83 genes) @ [facility] - Pathogenic mutation in the MITF gene  . GERD (gastroesophageal reflux disease)   . 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 Personal history of chemotherapy   . Personal history of radiation therapy     SURGICAL HISTORY: Past Surgical History:  Procedure Laterality Date  . BREAST LUMPECTOMY Left 04/03/2018  . BREAST LUMPECTOMY  WITH RADIOACTIVE SEED AND SENTINEL LYMPH NODE BIOPSY Left 04/03/2018   Procedure: LEFT BREAST LUMPECTOMY WITH RADIOACTIVE SEED AND LEFT SENTINEL LYMPH NODE BIOPSY;  Surgeon: Claud Kelp, MD;  Location: [facility];  Service: General;  Laterality: Left;  . IR IMAGING GUIDED PORT INSERTION  10/20/2017  . IR US GUIDE VASC ACCESS LEFT  10/20/2017  . MASTECTOMY Right 04/03/2018  . MASTECTOMY MODIFIED RADICAL Right 04/03/2018   Procedure: MASTECTOMY MODIFIED RADICAL;  Surgeon: Claud Kelp, MD;  Location: [facility];   Service: General;  Laterality: Right;  . PORT-A-CATH REMOVAL N/A 04/03/2018   Procedure: REMOVAL PORT-A-CATH;  Surgeon: Claud Kelp, MD;  Location: [facility];  Service: General;  Laterality: N/A;  . TONSILLECTOMY Bilateral   . TUBAL LIGATION      I have reviewed the social history and family history with the patient and they are unchanged from previous note.  ALLERGIES:  is allergic to bee venom.  MEDICATIONS:  Current Outpatient Medications  Medication Sig Dispense Refill  . amLODipine (NORVASC) 5 MG tablet TAKE 1 TABLET(5 MG) BY MOUTH DAILY 90 tablet 0  . b complex vitamins tablet Take 1 tablet by mouth daily.    . calcium carbonate (OS-CAL - DOSED IN MG OF ELEMENTAL CALCIUM) 1250 (500 Ca) MG tablet Take 1 tablet by mouth.    . calcium-vitamin D (CALCIUM 500/D) 500-200 MG-UNIT tablet Take 1 tablet by mouth.    . letrozole (FEMARA) 2.5 MG tablet Take 1 tablet (2.5 mg total) by mouth daily. 30 tablet 5  . magnesium chloride (SLOW-MAG) 64 MG TBEC SR tablet Take by mouth.    . magnesium citrate SOLN Take 1 Bottle by mouth once.    . Nutritional Supplements (VITAMIN D BOOSTER PO) Take by mouth.    . Ubiquinol (UBQH) 100 MG CAPS Take by mouth.     No current facility-administered medications for this visit.    PHYSICAL EXAMINATION: ECOG PERFORMANCE STATUS: 0 - Asymptomatic  Vitals:   07/20/20 1113  BP: (!) 159/90  Pulse: 74  Resp: 16  Temp: 97.9 F (36.6 C)  SpO2: 100%   Filed Weights   07/20/20 1113  Weight: 145 lb 9.6 oz (66 kg)    GENERAL:alert, no distress and comfortable SKIN: skin color, texture, turgor are normal, no rashes or significant lesions EYES: normal, Conjunctiva are pink and non-injected, sclera clear NECK: supple, thyroid normal size, non-tender, without nodularity LYMPH:  no palpable lymphadenopathy in the cervical, axillary  LUNGS: clear to auscultation and percussion with normal breathing effort HEART: regular rate & rhythm and no  murmurs and no lower extremity edema ABDOMEN:abdomen soft, non-tender and normal bowel sounds Musculoskeletal:no cyanosis of digits and no clubbing  NEURO: alert & oriented x 3 with fluent speech, no focal motor/sensory deficits Breasts: Breast inspection showed status post right mastectomy and left lumpectomy no palpable mass or nodule in left breast, or right chest wall, or axillary   LABORATORY DATA:  I have reviewed the data as listed CBC Latest Ref Rng & Units 07/20/2020 01/20/2020 08/19/2019  WBC 4.0 - 10.5 K/uL 6.9 6.5 8.1  Hemoglobin 12.0 - 15.0 g/dL 10.6 53.9 90.8  Hematocrit 36.0 - 46.0 % 42.1 41.0 42.7  Platelets 150 - 400 K/uL 197 198 220     CMP Latest Ref Rng & Units 07/20/2020 01/20/2020 08/19/2019  Glucose 70 - 99 mg/dL 98 96 93  BUN 8 - 23 mg/dL 52(W) 22 50(N)  Creatinine 0.44 - 1.00 mg/dL 0.91  0.79 0.86  Sodium 135 - 145 mmol/L 141 140 143  Potassium 3.5 - 5.1 mmol/L 4.6 3.8 4.5  Chloride 98 - 111 mmol/L 107 107 107  CO2 22 - 32 mmol/L $RemoveB'27 25 29  'nrfigSjO$ Calcium 8.9 - 10.3 mg/dL 9.3 9.7 9.1  Total Protein 6.5 - 8.1 g/dL 6.7 6.5 6.4(L)  Total Bilirubin 0.3 - 1.2 mg/dL 0.5 0.5 0.5  Alkaline Phos 38 - 126 U/L 63 64 68  AST 15 - 41 U/L 15 13(L) 15  ALT 0 - 44 U/L $Remo'16 15 15      'yZOuD$ RADIOGRAPHIC STUDIES: I have personally reviewed the radiological images as listed and agreed with the findings in the report. No results found.   ASSESSMENT & PLAN:  Darlene Beasley is a 65 y.o. female with   1.Bilateral breast cancer: right breast, invasiveductalcarcinoma in right, grade II, stage IIIA (cT3N1M0), ER 100% positive, PR 0%, negative, HER2 negative by FISH, Ki67 30% metastatic to 5 axillary lymph nodes, ypT3N2a; left breast - invasive ductal carcinoma, grade I, stage IA (cT1bN0M0), ER 100% positive, PR 80% positive, HER2 negative, Ki67 5%, ypT1bN0 -Diagnosed on 09/2017. 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 -She is s/pneoadjuvant chemo withAC-T,underwentleft  breastlumpectomyand right mastectomy.She had a very limited response to neoadjuvant chemotherapy.She also completed adjuvant radiation.  -Shestarted antiestrogen therapy withLetrozolein 10/2018.Plan for 7 years. Tolerating well with manageable night sweats and fatigue. No change in joint stiffness.  -She is clinically doing well. Lab reviewed, her CBC and CMP are within normal limits. Her physical exam was unremarkable.  Her last mammogram in August 2021 was negative  -Continue breast cancer surveillance.  Left diagnostic mammogram in 01/2021 -f/u in 7 months    2.Smokingcessation  -She has a long history of smoking,>40 years. Smoking Cessation was repeatedly reviewed with her.  -She smokes 1/2 PPD now. She has not been able to reduce further.  -I previously offered online smoking cessation counseling. She would like to use nicorette to help her quit.    3.HTN -On amlodipine, continue to f/u with PCP  4.Osteopenia -Her 07/2018 DEXA showsosteopeniawith lowest T-score of -2.1 at right femur neck. Will repeat in 01/2021 -She previously declined bisphosphonate -Continue calcium and VitDand weight bearing exercise  5. Weight management  -With Keto diet she has had intentional weight loss through healthy diet and physical exercise -Her goal weight is 135-140 pounds.she is doing well on that    Plan -she is doing well  -Continue Letrozole -Lab and f/u in 7 months, after mammogram and DEXA in 01/2021    No problem-specific Assessment & Plan notes found for this encounter.   Orders Placed This Encounter  Procedures  . MM DIAG BREAST TOMO UNI LEFT    Standing Status:   Future    Standing Expiration Date:   07/20/2021    Order Specific Question:   Reason for Exam (SYMPTOM  OR DIAGNOSIS REQUIRED)    Answer:   screening    Order Specific Question:   Preferred imaging location?    Answer:   [facility]  . DG Bone Density    Standing Status:   Future     Standing Expiration Date:   07/20/2021    Order Specific Question:   Reason for Exam (SYMPTOM  OR DIAGNOSIS REQUIRED)    Answer:   screening    Order Specific Question:   Preferred imaging location?    Answer:   [facility]   All questions were answered. The patient knows  to call the clinic with any problems, questions or concerns. No barriers to learning was detected. The total time spent in the appointment was 30 minutes.     [name], MD 07/20/2020   I, Joslyn Devon, am acting as scribe for [name], MD.   I have reviewed the above documentation for accuracy and completeness, and I agree with the above.

## 2020-07-20 ENCOUNTER — Other Ambulatory Visit: Payer: Self-pay

## 2020-07-20 ENCOUNTER — Inpatient Hospital Stay (HOSPITAL_BASED_OUTPATIENT_CLINIC_OR_DEPARTMENT_OTHER): Payer: Medicaid Other | Admitting: Hematology

## 2020-07-20 ENCOUNTER — Inpatient Hospital Stay: Payer: Medicaid Other | Attending: Hematology

## 2020-07-20 ENCOUNTER — Encounter: Payer: Self-pay | Admitting: Hematology

## 2020-07-20 ENCOUNTER — Telehealth: Payer: Self-pay | Admitting: Hematology

## 2020-07-20 VITALS — BP 159/90 | HR 74 | Temp 97.9°F | Resp 16 | Ht 64.0 in | Wt 145.6 lb

## 2020-07-20 DIAGNOSIS — E2839 Other primary ovarian failure: Secondary | ICD-10-CM

## 2020-07-20 DIAGNOSIS — C50412 Malignant neoplasm of upper-outer quadrant of left female breast: Secondary | ICD-10-CM

## 2020-07-20 DIAGNOSIS — F17210 Nicotine dependence, cigarettes, uncomplicated: Secondary | ICD-10-CM | POA: Diagnosis not present

## 2020-07-20 DIAGNOSIS — Z79899 Other long term (current) drug therapy: Secondary | ICD-10-CM | POA: Insufficient documentation

## 2020-07-20 DIAGNOSIS — I10 Essential (primary) hypertension: Secondary | ICD-10-CM | POA: Diagnosis not present

## 2020-07-20 DIAGNOSIS — Z170 Estrogen receptor positive status [ER+]: Secondary | ICD-10-CM

## 2020-07-20 DIAGNOSIS — Z79811 Long term (current) use of aromatase inhibitors: Secondary | ICD-10-CM | POA: Insufficient documentation

## 2020-07-20 DIAGNOSIS — M8580 Other specified disorders of bone density and structure, unspecified site: Secondary | ICD-10-CM | POA: Diagnosis not present

## 2020-07-20 DIAGNOSIS — C773 Secondary and unspecified malignant neoplasm of axilla and upper limb lymph nodes: Secondary | ICD-10-CM | POA: Insufficient documentation

## 2020-07-20 DIAGNOSIS — C50811 Malignant neoplasm of overlapping sites of right female breast: Secondary | ICD-10-CM | POA: Insufficient documentation

## 2020-07-20 LAB — CMP (CANCER CENTER ONLY)
ALT: 16 U/L (ref 0–44)
AST: 15 U/L (ref 15–41)
Albumin: 4.1 g/dL (ref 3.5–5.0)
Alkaline Phosphatase: 63 U/L (ref 38–126)
Anion gap: 7 (ref 5–15)
BUN: 29 mg/dL — ABNORMAL HIGH (ref 8–23)
CO2: 27 mmol/L (ref 22–32)
Calcium: 9.3 mg/dL (ref 8.9–10.3)
Chloride: 107 mmol/L (ref 98–111)
Creatinine: 0.84 mg/dL (ref 0.44–1.00)
GFR, Estimated: >60 mL/min (ref >60)
Glucose, Bld: 98 mg/dL (ref 70–99)
Potassium: 4.6 mmol/L (ref 3.5–5.1)
Sodium: 141 mmol/L (ref 135–145)
Total Bilirubin: 0.5 mg/dL (ref 0.3–1.2)
Total Protein: 6.7 g/dL (ref 6.5–8.1)

## 2020-07-20 LAB — CBC WITH DIFFERENTIAL (CANCER CENTER ONLY)
Abs Immature Granulocytes: 0.01 10*3/uL (ref 0.00–0.07)
Basophils Absolute: 0.1 10*3/uL (ref 0.0–0.1)
Basophils Relative: 1 %
Eosinophils Absolute: 0.2 10*3/uL (ref 0.0–0.5)
Eosinophils Relative: 3 %
HCT: 42.1 % (ref 36.0–46.0)
Hemoglobin: 14.4 g/dL (ref 12.0–15.0)
Immature Granulocytes: 0 %
Lymphocytes Relative: 23 %
Lymphs Abs: 1.6 10*3/uL (ref 0.7–4.0)
MCH: 31.9 pg (ref 26.0–34.0)
MCHC: 34.2 g/dL (ref 30.0–36.0)
MCV: 93.1 fL (ref 80.0–100.0)
Monocytes Absolute: 0.4 10*3/uL (ref 0.1–1.0)
Monocytes Relative: 5 %
Neutro Abs: 4.7 10*3/uL (ref 1.7–7.7)
Neutrophils Relative %: 68 %
Platelet Count: 197 10*3/uL (ref 150–400)
RBC: 4.52 MIL/uL (ref 3.87–5.11)
RDW: 13.4 % (ref 11.5–15.5)
WBC Count: 6.9 10*3/uL (ref 4.0–10.5)
nRBC: 0 % (ref 0.0–0.2)

## 2020-07-20 NOTE — Telephone Encounter (Signed)
Attempted to contact patient regarding follow-up appointment. Voicemail not set up. Mailed calendar.

## 2020-08-07 ENCOUNTER — Other Ambulatory Visit: Payer: Self-pay | Admitting: Nurse Practitioner

## 2020-08-07 DIAGNOSIS — C50811 Malignant neoplasm of overlapping sites of right female breast: Secondary | ICD-10-CM

## 2020-08-07 DIAGNOSIS — Z170 Estrogen receptor positive status [ER+]: Secondary | ICD-10-CM

## 2020-09-13 ENCOUNTER — Other Ambulatory Visit: Payer: Self-pay | Admitting: Family Medicine

## 2020-09-13 DIAGNOSIS — I10 Essential (primary) hypertension: Secondary | ICD-10-CM

## 2020-09-13 NOTE — Telephone Encounter (Signed)
Rx request- no future appointment- last RF 05/25/20

## 2020-11-30 ENCOUNTER — Encounter: Payer: Self-pay | Admitting: Hematology

## 2020-12-21 ENCOUNTER — Other Ambulatory Visit: Payer: Self-pay | Admitting: Family Medicine

## 2020-12-21 DIAGNOSIS — I10 Essential (primary) hypertension: Secondary | ICD-10-CM

## 2021-01-24 ENCOUNTER — Other Ambulatory Visit: Payer: Self-pay | Admitting: Family Medicine

## 2021-01-24 DIAGNOSIS — I10 Essential (primary) hypertension: Secondary | ICD-10-CM

## 2021-01-25 ENCOUNTER — Other Ambulatory Visit: Payer: Self-pay

## 2021-01-26 ENCOUNTER — Encounter: Payer: Self-pay | Admitting: Hematology

## 2021-01-26 ENCOUNTER — Emergency Department (HOSPITAL_COMMUNITY): Payer: Medicaid Other

## 2021-01-26 ENCOUNTER — Other Ambulatory Visit: Payer: Self-pay

## 2021-01-26 ENCOUNTER — Encounter (HOSPITAL_COMMUNITY): Payer: Self-pay | Admitting: Emergency Medicine

## 2021-01-26 ENCOUNTER — Emergency Department (HOSPITAL_COMMUNITY)
Admission: EM | Admit: 2021-01-26 | Discharge: 2021-01-26 | Disposition: A | Discharge status: Home / Self Care | Payer: Medicaid Other | Attending: Emergency Medicine | Admitting: Emergency Medicine

## 2021-01-26 DIAGNOSIS — M25551 Pain in right hip: Secondary | ICD-10-CM | POA: Diagnosis not present

## 2021-01-26 DIAGNOSIS — M25511 Pain in right shoulder: Secondary | ICD-10-CM | POA: Diagnosis not present

## 2021-01-26 DIAGNOSIS — J40 Bronchitis, not specified as acute or chronic: Secondary | ICD-10-CM | POA: Diagnosis not present

## 2021-01-26 DIAGNOSIS — R079 Chest pain, unspecified: Secondary | ICD-10-CM | POA: Insufficient documentation

## 2021-01-26 DIAGNOSIS — Y92410 Unspecified street and highway as the place of occurrence of the external cause: Secondary | ICD-10-CM | POA: Diagnosis not present

## 2021-01-26 DIAGNOSIS — S5012XA Contusion of left forearm, initial encounter: Secondary | ICD-10-CM | POA: Diagnosis not present

## 2021-01-26 DIAGNOSIS — R059 Cough, unspecified: Secondary | ICD-10-CM | POA: Insufficient documentation

## 2021-01-26 DIAGNOSIS — S59912A Unspecified injury of left forearm, initial encounter: Secondary | ICD-10-CM | POA: Diagnosis present

## 2021-01-26 DIAGNOSIS — Z853 Personal history of malignant neoplasm of breast: Secondary | ICD-10-CM | POA: Insufficient documentation

## 2021-01-26 DIAGNOSIS — F17210 Nicotine dependence, cigarettes, uncomplicated: Secondary | ICD-10-CM | POA: Insufficient documentation

## 2021-01-26 MED ORDER — BENZONATATE 100 MG PO CAPS
100.0000 mg | ORAL_CAPSULE | Freq: Three times a day (TID) | ORAL | 0 refills | Status: DC
Start: 2021-01-26 — End: 2021-01-26

## 2021-01-26 MED ORDER — PREDNISONE 10 MG PO TABS
40.0000 mg | ORAL_TABLET | Freq: Every day | ORAL | 0 refills | Status: DC
Start: 2021-01-26 — End: 2021-01-26

## 2021-01-26 MED ORDER — PREDNISONE 10 MG PO TABS
40.0000 mg | ORAL_TABLET | Freq: Every day | ORAL | 0 refills | Status: DC
Start: 2021-01-26 — End: 2021-02-15

## 2021-01-26 MED ORDER — BENZONATATE 100 MG PO CAPS
100.0000 mg | ORAL_CAPSULE | Freq: Three times a day (TID) | ORAL | 0 refills | Status: DC
Start: 2021-01-26 — End: 2023-05-26
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
## 2021-02-15 ENCOUNTER — Encounter: Payer: Self-pay | Admitting: Hematology

## 2021-02-15 ENCOUNTER — Inpatient Hospital Stay (HOSPITAL_BASED_OUTPATIENT_CLINIC_OR_DEPARTMENT_OTHER): Payer: Medicare Other | Admitting: Hematology

## 2021-02-15 ENCOUNTER — Inpatient Hospital Stay: Payer: Medicare Other | Attending: Hematology

## 2021-02-15 ENCOUNTER — Other Ambulatory Visit: Payer: Self-pay

## 2021-02-15 DIAGNOSIS — C50811 Malignant neoplasm of overlapping sites of right female breast: Secondary | ICD-10-CM

## 2021-02-15 DIAGNOSIS — C773 Secondary and unspecified malignant neoplasm of axilla and upper limb lymph nodes: Secondary | ICD-10-CM | POA: Insufficient documentation

## 2021-02-15 DIAGNOSIS — Z79899 Other long term (current) drug therapy: Secondary | ICD-10-CM | POA: Insufficient documentation

## 2021-02-15 DIAGNOSIS — Z79811 Long term (current) use of aromatase inhibitors: Secondary | ICD-10-CM | POA: Insufficient documentation

## 2021-02-15 DIAGNOSIS — I10 Essential (primary) hypertension: Secondary | ICD-10-CM

## 2021-02-15 DIAGNOSIS — Z170 Estrogen receptor positive status [ER+]: Secondary | ICD-10-CM

## 2021-02-15 DIAGNOSIS — C50412 Malignant neoplasm of upper-outer quadrant of left female breast: Secondary | ICD-10-CM | POA: Insufficient documentation

## 2021-02-15 DIAGNOSIS — M85851 Other specified disorders of bone density and structure, right thigh: Secondary | ICD-10-CM | POA: Diagnosis not present

## 2021-02-15 DIAGNOSIS — F17210 Nicotine dependence, cigarettes, uncomplicated: Secondary | ICD-10-CM | POA: Insufficient documentation

## 2021-02-15 LAB — CMP (CANCER CENTER ONLY)
ALT: 15 U/L (ref 0–44)
AST: 17 U/L (ref 15–41)
Albumin: 3.8 g/dL (ref 3.5–5.0)
Alkaline Phosphatase: 74 U/L (ref 38–126)
Anion gap: 11 (ref 5–15)
BUN: 21 mg/dL (ref 8–23)
CO2: 24 mmol/L (ref 22–32)
Calcium: 9.3 mg/dL (ref 8.9–10.3)
Chloride: 107 mmol/L (ref 98–111)
Creatinine: 0.88 mg/dL (ref 0.44–1.00)
GFR, Estimated: >60 mL/min (ref >60)
Glucose, Bld: 108 mg/dL — ABNORMAL HIGH (ref 70–99)
Potassium: 4.2 mmol/L (ref 3.5–5.1)
Sodium: 142 mmol/L (ref 135–145)
Total Bilirubin: 0.6 mg/dL (ref 0.3–1.2)
Total Protein: 6.6 g/dL (ref 6.5–8.1)

## 2021-02-15 LAB — CBC WITH DIFFERENTIAL (CANCER CENTER ONLY)
Abs Immature Granulocytes: 0.01 10*3/uL (ref 0.00–0.07)
Basophils Absolute: 0 10*3/uL (ref 0.0–0.1)
Basophils Relative: 1 %
Eosinophils Absolute: 0.2 10*3/uL (ref 0.0–0.5)
Eosinophils Relative: 4 %
HCT: 41.9 % (ref 36.0–46.0)
Hemoglobin: 14.1 g/dL (ref 12.0–15.0)
Immature Granulocytes: 0 %
Lymphocytes Relative: 22 %
Lymphs Abs: 1.2 10*3/uL (ref 0.7–4.0)
MCH: 31.3 pg (ref 26.0–34.0)
MCHC: 33.7 g/dL (ref 30.0–36.0)
MCV: 92.9 fL (ref 80.0–100.0)
Monocytes Absolute: 0.3 10*3/uL (ref 0.1–1.0)
Monocytes Relative: 6 %
Neutro Abs: 3.6 10*3/uL (ref 1.7–7.7)
Neutrophils Relative %: 67 %
Platelet Count: 230 10*3/uL (ref 150–400)
RBC: 4.51 MIL/uL (ref 3.87–5.11)
RDW: 13.8 % (ref 11.5–15.5)
WBC Count: 5.4 10*3/uL (ref 4.0–10.5)
nRBC: 0 % (ref 0.0–0.2)

## 2021-02-15 MED ORDER — LETROZOLE 2.5 MG PO TABS: ORAL_TABLET | ORAL | 1 refills | Status: DC

## 2021-02-15 MED ORDER — AMLODIPINE BESYLATE 5 MG PO TABS: 5.0000 mg | ORAL_TABLET | Freq: Every day | ORAL | 1 refills | Status: DC
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
## 2021-02-16 ENCOUNTER — Inpatient Hospital Stay: Payer: Medicare Other | Admitting: Hematology

## 2021-02-16 ENCOUNTER — Other Ambulatory Visit: Payer: Medicaid Other

## 2021-02-18 ENCOUNTER — Other Ambulatory Visit: Payer: Self-pay | Admitting: Hematology

## 2021-02-18 DIAGNOSIS — C50811 Malignant neoplasm of overlapping sites of right female breast: Secondary | ICD-10-CM

## 2021-02-19 ENCOUNTER — Encounter: Payer: Self-pay | Admitting: Hematology

## 2021-02-21 ENCOUNTER — Other Ambulatory Visit: Payer: Self-pay

## 2021-02-21 ENCOUNTER — Ambulatory Visit
Admission: RE | Admit: 2021-02-21 | Discharge: 2021-02-21 | Disposition: A | Discharge status: Home / Self Care | Payer: Medicaid Other | Source: Ambulatory Visit | Attending: Hematology | Admitting: Hematology

## 2021-02-21 DIAGNOSIS — C50412 Malignant neoplasm of upper-outer quadrant of left female breast: Secondary | ICD-10-CM

## 2021-02-21 DIAGNOSIS — Z170 Estrogen receptor positive status [ER+]: Secondary | ICD-10-CM

## 2021-04-05 ENCOUNTER — Encounter: Payer: Self-pay | Admitting: Hematology

## 2021-04-06 ENCOUNTER — Encounter: Payer: Self-pay | Admitting: Hematology

## 2021-04-13 ENCOUNTER — Encounter: Payer: Self-pay | Admitting: Hematology

## 2021-04-23 ENCOUNTER — Encounter: Payer: Self-pay | Admitting: Hematology

## 2021-07-11 ENCOUNTER — Encounter: Payer: Self-pay | Admitting: Hematology

## 2021-07-16 ENCOUNTER — Other Ambulatory Visit: Payer: Medicaid Other

## 2021-07-18 ENCOUNTER — Encounter: Payer: Self-pay | Admitting: Hematology

## 2021-07-18 ENCOUNTER — Other Ambulatory Visit: Payer: Self-pay

## 2021-07-18 ENCOUNTER — Ambulatory Visit
Admission: RE | Admit: 2021-07-18 | Discharge: 2021-07-18 | Disposition: A | Discharge status: Home / Self Care | Payer: Medicaid Other | Source: Ambulatory Visit | Attending: Hematology | Admitting: Hematology

## 2021-07-18 DIAGNOSIS — E2839 Other primary ovarian failure: Secondary | ICD-10-CM

## 2021-07-27 ENCOUNTER — Encounter: Payer: Self-pay | Admitting: Hematology

## 2021-08-16 ENCOUNTER — Inpatient Hospital Stay: Payer: Medicaid Other | Attending: Hematology

## 2021-08-16 ENCOUNTER — Other Ambulatory Visit: Payer: Self-pay

## 2021-08-16 ENCOUNTER — Encounter: Payer: Self-pay | Admitting: Hematology

## 2021-08-16 ENCOUNTER — Inpatient Hospital Stay (HOSPITAL_BASED_OUTPATIENT_CLINIC_OR_DEPARTMENT_OTHER): Payer: Medicaid Other | Admitting: Hematology

## 2021-08-16 VITALS — BP 157/70 | HR 76 | Temp 98.6°F | Resp 18 | Ht 64.0 in | Wt 151.5 lb

## 2021-08-16 DIAGNOSIS — Z79811 Long term (current) use of aromatase inhibitors: Secondary | ICD-10-CM | POA: Insufficient documentation

## 2021-08-16 DIAGNOSIS — Z170 Estrogen receptor positive status [ER+]: Secondary | ICD-10-CM

## 2021-08-16 DIAGNOSIS — Z1231 Encounter for screening mammogram for malignant neoplasm of breast: Secondary | ICD-10-CM

## 2021-08-16 DIAGNOSIS — C50412 Malignant neoplasm of upper-outer quadrant of left female breast: Secondary | ICD-10-CM

## 2021-08-16 DIAGNOSIS — Z1509 Genetic susceptibility to other malignant neoplasm: Secondary | ICD-10-CM | POA: Insufficient documentation

## 2021-08-16 DIAGNOSIS — Z1501 Genetic susceptibility to malignant neoplasm of breast: Secondary | ICD-10-CM | POA: Insufficient documentation

## 2021-08-16 DIAGNOSIS — C50811 Malignant neoplasm of overlapping sites of right female breast: Secondary | ICD-10-CM | POA: Diagnosis present

## 2021-08-16 DIAGNOSIS — Z79899 Other long term (current) drug therapy: Secondary | ICD-10-CM | POA: Diagnosis not present

## 2021-08-16 LAB — CMP (CANCER CENTER ONLY)
ALT: 14 U/L (ref 0–44)
AST: 15 U/L (ref 15–41)
Albumin: 4.1 g/dL (ref 3.5–5.0)
Alkaline Phosphatase: 65 U/L (ref 38–126)
Anion gap: 4 — ABNORMAL LOW (ref 5–15)
BUN: 22 mg/dL (ref 8–23)
CO2: 31 mmol/L (ref 22–32)
Calcium: 9.5 mg/dL (ref 8.9–10.3)
Chloride: 107 mmol/L (ref 98–111)
Creatinine: 0.75 mg/dL (ref 0.44–1.00)
GFR, Estimated: >60 mL/min (ref >60)
Glucose, Bld: 105 mg/dL — ABNORMAL HIGH (ref 70–99)
Potassium: 5.2 mmol/L — ABNORMAL HIGH (ref 3.5–5.1)
Sodium: 142 mmol/L (ref 135–145)
Total Bilirubin: 0.6 mg/dL (ref 0.3–1.2)
Total Protein: 6.4 g/dL — ABNORMAL LOW (ref 6.5–8.1)

## 2021-08-16 LAB — CBC WITH DIFFERENTIAL (CANCER CENTER ONLY)
Abs Immature Granulocytes: 0.01 10*3/uL (ref 0.00–0.07)
Basophils Absolute: 0.1 10*3/uL (ref 0.0–0.1)
Basophils Relative: 1 %
Eosinophils Absolute: 0.3 10*3/uL (ref 0.0–0.5)
Eosinophils Relative: 4 %
HCT: 43.3 % (ref 36.0–46.0)
Hemoglobin: 14.4 g/dL (ref 12.0–15.0)
Immature Granulocytes: 0 %
Lymphocytes Relative: 23 %
Lymphs Abs: 1.5 10*3/uL (ref 0.7–4.0)
MCH: 31.2 pg (ref 26.0–34.0)
MCHC: 33.3 g/dL (ref 30.0–36.0)
MCV: 93.7 fL (ref 80.0–100.0)
Monocytes Absolute: 0.4 10*3/uL (ref 0.1–1.0)
Monocytes Relative: 6 %
Neutro Abs: 4.4 10*3/uL (ref 1.7–7.7)
Neutrophils Relative %: 66 %
Platelet Count: 241 10*3/uL (ref 150–400)
RBC: 4.62 MIL/uL (ref 3.87–5.11)
RDW: 13.2 % (ref 11.5–15.5)
WBC Count: 6.5 10*3/uL (ref 4.0–10.5)
nRBC: 0 % (ref 0.0–0.2)

## 2021-08-16 NOTE — Progress Notes (Signed)
[place]   Telephone:([phone] [phone]) [phone]   Clinic Follow up Note   Patient Care Team: Pcp, No as PCP - General [name], MD as Consulting Physician (General Surgery) [name], MD as Consulting Physician (Hematology) Kyung Rudd, MD as Consulting Physician (Radiation Oncology) Alla Feeling, NP as Nurse Practitioner (Nurse Practitioner)  Date of Service:  08/16/2021  CHIEF COMPLAINT: f/u of bilateral breast cancers  CURRENT THERAPY:  Letrozole 2.$RemoveBefo'5mg'enspvlNtJVz$  daily starting 10/2018  ASSESSMENT & PLAN:  Darlene Beasley is a 66 y.o. female with   1. Bilateral breast cancer: right breast- invasive ductal carcinoma, grade 2, stage IIIA (cT3N1M0), ER+/PR-/HER2-, FXT0W4O; left breast - invasive ductal carcinoma, grade 1, stage IA (cT1bN0M0), ER+/PR+/HER2-, ypT1bN0 -Diagnosed in 09/2017. 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  -She is s/p neoadjuvant chemo with AC-T, left lumpectomy and right mastectomy. She had a very limited response to neoadjuvant chemotherapy. She also completed adjuvant radiation.  -She started antiestrogen therapy with Letrozole in 10/2018. Plan for 7 years. Tolerating well with manageable night sweats and fatigue. No change in joint stiffness.  -I reviewed her recent bone density scan, which showed osteoporosis.  I recommend Zometa infusion, she will think about it.  If she declines, I plan to change letrozole to tamoxifen.  I discussed the benefit and side effect of tamoxifen. -most recent left mammogram 02/21/21 was benign. -She is clinically doing well. Lab reviewed, overall no concern. Her physical exam was unremarkable.  -Continue breast cancer surveillance. Left mammogram due 02/2022 -f/u in 6 months   2. Osteoporosis -Her 07/2018 DEXA shows osteopenia with lowest T-score of -2.1 at right femur neck. Repeat on 07/18/21 showed worsening to -2.6 -She previously declined bisphosphonate. We reviewed again today, I recommend Zometa IV infusion  every 6 months for 2 years.  I discussed the additional benefit of decreasing bone metastasis from breast cancer from Zometa.  And I provided her with printed material. She will consider after speaking with her dentist. -She continues with VitD and weight bearing exercise, she is no longer taking calcium.   3. Smoking cessation  -She has a long history of smoking, >40 years. Smoking Cessation was repeatedly reviewed with her.  -She smokes 1/2 PPD now. She has not been able to reduce further.  -I previously offered online smoking cessation counseling. She would like to use nicorette to help her quit.     4.  HTN -On amlodipine.   5. Weight management  -With Keto diet she has had intentional weight loss through healthy diet and physical exercise  -Her goal weight is 135-140 pounds.     Plan  -Continue Letrozole -she will discuss bisphosphonate zometa with her dentist -left mammogram due 02/2022, I ordered today -Lab and f/u with NP Lacie in 6 months   No problem-specific Assessment & Plan notes found for this encounter.   SUMMARY OF ONCOLOGIC HISTORY: Oncology History Overview Note  Cancer Staging Cancer of overlapping sites of right female breast [facility]) Staging form: Breast, AJCC 8th Edition - Clinical stage from 09/23/2017: Stage IIIA (cT3, cN1, cM0, G2, ER+, PR-, HER2-) - Signed by [name], MD on 10/09/2017 - Pathologic stage from 04/03/2018: No Stage Recommended (ypT3, pN2a, cM0, G3, ER+, PR-, HER2-) - Signed by [name], MD on 05/03/2018  Malignant neoplasm of left breast in female, estrogen receptor positive ([place]) Staging form: Breast, AJCC 8th Edition - Clinical: Stage IA (cT1b, cN0, cM0, G1, ER+, PR+, HER2-) - Unsigned - Pathologic stage  from 04/03/2018: No Stage Recommended (ypT1b, pN0, cM0, G1, ER+, PR+, HER2-) - Signed by [name], MD on 05/03/2018     Cancer of overlapping sites of right female breast ([place])  09/09/2017 Mammogram   IMPRESSION: 1. Highly suspicious  large right breast mass involving the outer and upper breast extending from approximately 9 o'clock through 12-1 o'clock, maximum measurement approximating 7.5 cm. The mass involves the right nipple, accounting for nipple retraction. Architectural distortion, microcalcifications and diffuse trabecular thickening and skin thickening are associated with the large mass, raising the possibility of this representing an inflammatory cancer. 2. Satellite masses separate from the dominant mass involving the lower outer quadrant and the upper inner quadrant of the right breast, measured above. 3. Two adjacent pathologic right axillary lymph nodes. 4. Indeterminate 0.9 cm solid mass involving the upper outer quadrant of the left breast which accounts for a mammographic finding. 5. No pathologic left axillary lymphadenopathy.   09/09/2017 Breast US   Targeted right breast ultrasound is performed, showing a very large hypoechoic mass with irregular margins extending from the approximate 9 o'clock position through the 12 to 1 o'clock position. On the CC gait image, the mass measures maximally approximately 7.5 cm. The mass has irregular margins, demonstrates acoustic shadowing, and demonstrates internal power Doppler flow. The mass extends into the retroareolar region and involves the nipple, accounting for the nipple retraction.   Separate from the dominant mass at the 7 o'clock position approximately 4 cm from nipple is a hypoechoic antiparallel mass with irregular margins measuring approximately 1.4 x 2.7 x 1.7 cm.  Separate from the dominant mass at the 1 o'clock position approximately 2 cm from nipple is a hypoechoic mass with irregular margins measuring approximately 1.7 x 0.5 x 1.6 cm. Both of these masses also demonstrate internal power Doppler flow.   Sonographic evaluation of the right axilla demonstrates 2 adjacent pathologic lymph nodes, the larger measuring approximately 2.5 x 2.5 x 2.8 cm, the smaller  measuring approximately 2.0 x 0.8 x 2.7 cm.   09/23/2017 Initial Biopsy   Diagnosis 1. Breast, left, needle core biopsy, 2 o'clock - INVASIVE DUCTAL CARCINOMA. - SEE COMMENT. 2. Breast, right, needle core biopsy, 11:30 o'clock - INVASIVE MAMMARY CARCINOMA. - SEE COMMENT. 3. Lymph node, needle/core biopsy, right axilla - METASTATIC MAMMARY CARCINOMA IN 1 OF 1 LYMPH NODE (1/1). Microscopic Comment 1. There is a small focus of grade I invasive ductal carcinoma. A complete breast prognostic profile will be attempted and the results reported separately. The results were called to the [facility] on 09/24/2017. 2. The carcinoma appears grade II. An E-Cadherin stain and a breast prognostic profile will be performed on part 2 and the results reported separately. (JBK:kh 09-24-17) [name]  1. HER2 Negative by FISH Estrogen receptor: 100% positive, strong staining Progesterone receptor: 80% positive, strong staining Ki67: 5%  2. HER2 Negative by FISH Estrogen receptor: 100% positive, strong staining Progesterone receptor: 0%, negative  Ki67: 30% 2. The tumor cells are strongly positive for E-cadherin, supporting a ductal phenotype   09/23/2017 Cancer Staging   Staging form: Breast, AJCC 8th Edition - Clinical stage from 09/23/2017: Stage IIIA (cT3, cN1, cM0, G2, ER+, PR-, HER2-) - Signed by [name], MD on 10/09/2017    10/07/2017 Breast MRI   IMPRESSION: 1. Biopsy proven malignancy involving all 4 quadrants of the right breast, although predominantly located in the anterior third of the central upper right breast measures 6.8 x 9.6 x 7.2 cm.  2. Five pathologic right axillary lymph nodes, compatible with biopsy proven axillary metastases.   3. Small mass with associated biopsy related changes in the upper-outer left breast at site of known malignancy measures 0.9 x 0.8 x 0.7 cm. No abnormal lymph nodes seen in the left axilla.   RECOMMENDATION: Treatment plan for known  bilateral breast malignancy.   BI-RADS CATEGORY  6: Known biopsy-proven malignancy.   10/08/2017 Initial Diagnosis   Cancer of overlapping sites of right female breast ([place])   10/13/2017 Pathology Results   Diagnosis 1. Breast, right, needle core biopsy, 1 o'clock - INVASIVE DUCTAL CARCINOMA. - SEE COMMENT. 2. Breast, right, needle core biopsy, 7 o'clock - INVASIVE DUCTAL CARCINOMA. - SEE COMMENT. Microscopic Comment 1. and 2. The carcinoma in parts 1 and 2 is morphologically similar and appears grade II. Because breast prognostic profiles were performed on the patients previous case, [id number], it will not be repeated on the current case unless requested.    10/17/2017 Imaging   CT CAP IMPRESSION: 1. Infiltrative right breast mass with overlying skin thickening is identified compatible with known breast cancer. 2. Enlarged right axillary lymph nodes compatible with metastatic adenopathy. 3. No additional sites of disease identified within the chest and no evidence for metastasis to the abdomen or pelvis. 4. Aortic atherosclerosis and 3 vessel coronary artery atherosclerotic calcifications. Aortic Atherosclerosis (ICD10-I70.0).   10/17/2017 Imaging   Bone Scan FINDINGS: There are no foci of increased or decreased radiotracer uptake to suggest osseous metastatic disease. 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 There is degenerative type uptake within both shoulders and the left ankle.   Normal physiologic activity is identified within the kidneys and urinary bladder.   IMPRESSION: No evidence of osseous metastatic disease.   10/22/2017 -  Chemotherapy   AC q2 weeks x4 cycles starting 10/22/17-12/03/17, followed by weekly taxol x12 12/17/17-03/11/18   11/14/2017 Genetic Testing   The genes that were analyzed were the 83 genes on [facility]'s Multi-Cancer panel (ALK, APC, ATM, AXIN2, BAP1,  BARD1, BLM, BMPR1A, BRCA1, BRCA2, BRIP1, CASR, CDC73, CDH1, CDK4, CDKN1B, CDKN1C, CDKN2A, CEBPA, CHEK2, CTNNA1, DICER1, DIS3L2, EGFR, EPCAM, FH, FLCN, GATA2, GPC3, GREM1, HOXB13, HRAS, KIT, MAX, MEN1, MET, MITF, MLH1, MSH2, MSH3, MSH6, MUTYH, NBN, NF1, NF2, NTHL1, PALB2, PDGFRA, PHOX2B, PMS2, POLD1, POLE, POT1, PRKAR1A, PTCH1, PTEN, RAD50, RAD51C, RAD51D, RB1, RECQL4, RET, RUNX1, SDHA, SDHAF2, SDHB, SDHC, SDHD, SMAD4, SMARCA4, SMARCB1, SMARCE1, STK11, SUFU, TERC, TERT, TMEM127, TP53, TSC1, TSC2, VHL, WRN, WT1).  Testing revealed a pathogenic mutation in the MITF gene called c.952G>A (p.Glu318Lys) and A Variant of Unknown Significance (VUS) was also detected: POLD1 c.301A>T (p.Ile101Phe).   12/16/2017 Breast US   IMPRESSION: 1. Stable to minimal decrease in size of multiple irregular masses throughout the right breast consistent with the patient's sites of biopsy proven malignancy. While the mammographic appearance is slightly less dense in this region, there is increased skin and trabecular thickening involving the entire right breast. 2. Stable appearance of morphologically abnormal right axillary lymph nodes, consistent with biopsy proven metastatic disease. 3. Stable appearance of a left breast mass, consistent with biopsy proven malignancy.     04/03/2018 Cancer Staging   Staging form: Breast, AJCC 8th Edition - Pathologic stage from 04/03/2018: No Stage Recommended (ypT3, pN2a, cM0, G3, ER+, PR-, HER2-) - Signed by [name], MD on 05/03/2018    10/19/2018 -  Anti-estrogen oral therapy   Adjuvant letrozole 2.5mg   daily     Survivorship   Per Santiago Glad, NP    Malignant neoplasm of left breast in female, estrogen receptor positive ([facility])  09/09/2017 Breast US   Targeted left breast ultrasound is performed, showing an oval parallel hypoechoic mass containing microcalcifications at the 2 o'clock position approximately 6 cm from nipple measuring approximately 0.9 x 0.5 x 0.9 cm, corresponding to  the mammographic finding.   Sonographic evaluation of the left axilla demonstrates no pathologic lymphadenopathy.   09/23/2017 Initial Biopsy   Diagnosis 1. Breast, left, needle core biopsy, 2 o'clock - INVASIVE DUCTAL CARCINOMA. - SEE COMMENT. 2. Breast, right, needle core biopsy, 11:30 o'clock - INVASIVE MAMMARY CARCINOMA. - SEE COMMENT. 3. Lymph node, needle/core biopsy, right axilla - METASTATIC MAMMARY CARCINOMA IN 1 OF 1 LYMPH NODE (1/1). Microscopic Comment 1. There is a small focus of grade I invasive ductal carcinoma. A complete breast prognostic profile will be attempted and the results reported separately. The results were called to the [facility] on 09/24/2017. 2. The carcinoma appears grade II. An E-Cadherin stain and a breast prognostic profile will be performed on part 2 and the results reported separately. (JBK:kh 09-24-17) [name]  1. HER2 Negative by FISH Estrogen receptor: 100% positive, strong staining Progesterone receptor: 80% positive, strong staining Ki67: 5%  2. HER2 Negative by FISH Estrogen receptor: 100% positive, strong staining Progesterone receptor: 0%, negative  Ki67: 30%   10/08/2017 Initial Diagnosis   Malignant neoplasm of left breast in female, estrogen receptor positive ([facility])   12/16/2017 Breast US   IMPRESSION: 3. Stable appearance of a left breast mass, consistent with biopsy proven malignancy.     01/22/2018 Imaging   01/22/2018 MRI Bilateral Breast IMPRESSION: 1. Dominant enhancing mass with surrounding nodularity involving the central anterior third of the right breast now measures up to 6.4 x 6.7 x 7.6 cm (previously measured 6.8 x 9.6 x 7.2 cm). Persistent bulky right axillary lymphadenopathy, slightly decreased in size since prior MRI.   2. Biopsy proven left breast malignancy now measures 0.5 cm (previously measured 0.9 x 0.8 x 0.7 cm).    04/03/2018 Pathology Results   Diagnosis 1. Breast, lumpectomy, left  with radioactive seed - RESIDUAL INVASIVE DUCTAL CARCINOMA STATUS POST NEOADJUVANT THERAPY (0.9 CM) - MARGINS UNINVOLVED BY CARCINOMA (0.2 CM; INFERIOR MARGIN) - CALCIFICATIONS ASSOCIATED WITH CARCINOMA - PREVIOUS BIOPSY SITE CHANGES PRESENT - SEE ONCOLOGY TABLE AND COMMENT BELOW 2. Lymph node, sentinel, biopsy, left axillary #1 - NO CARCINOMA IDENTIFIED IN ONE LYMPH NODE (0/1) - SEE COMMENT 3. Lymph node, sentinel, biopsy, left axillary #2 - NO CARCINOMA IDENTIFIED IN ONE LYMPH NODE (0/1) - SEE COMMENT 4. Lymph node, sentinel, biopsy, left - NO CARCINOMA IDENTIFIED IN ONE LYMPH NODE (0/1) - SEE COMMENT 5. Lymph node, sentinel, biopsy, left axillary #3 - NO CARCINOMA IDENTIFIED IN ONE LYMPH NODE (0/1) - SEE COMMENT 6. Breast, modified radical mastectomy , right - RESIDUAL INVASIVE DUCTAL CARCINOMA STATUS POST NEOADJUVANT THERAPY (8.5 CM) - EXTENSIVE LYMPHOVASCULAR SPACE INVASION PRESENT - METASTATIC CARCINOMA INVOLVING FIVE OF FIFTEEN LYMPH NODES WITH EXTRACAPSULAR EXTENSION (5/15) - MARGINS UNINVOLVED BY CARCINOMA - SEE ONCOLOGY TABLE AND COMMENT BELOW   04/03/2018 Receptors her2   The tumor cells are NEGATIVE for Her2 (1+). Estrogen Receptor: 95%, POSITIVE, STRONG STAINING INTENSITY Progesterone Receptor: 95%, POSITIVE, STRONG STAINING INTENSITY  The tumor cells are NEGATIVE for Her2 (1+). Estrogen Receptor: 100%, POSITIVE, STRONG STAINING INTENSITY Progesterone Receptor: 0%, NEGATIVE   04/03/2018 Cancer Staging  Staging form: Breast, AJCC 8th Edition - Pathologic stage from 04/03/2018: No Stage Recommended (ypT1b, pN0, cM0, G1, ER+, PR+, HER2-) - Signed by [name], MD on 05/03/2018    04/03/2018 Surgery   Left LEFT BREAST LUMPECTOMY WITH RADIOACTIVE SEED AND LEFT SENTINEL LYMPH NODE BIOPSY with [name], MD    05/20/2018 - 07/08/2018 Radiation Therapy   Daily adjuvant radiation treatment with Dr.Moody.    Radiation treatment dates:   05/20/2018 -  07/08/2018   Site/dose:   The patient initially received a dose of 50.4 Gy in 28 fractions to the right chest wall and supraclavicular region. This was delivered using a 3-D conformal, 4 field technique. The patient then received a boost to the mastectomy scar. This delivered an additional 10 Gy in 5 fractions using an en face electron field. The total dose was 60.4 Gy. At the same time, the patient received a dose of 50.4 Gy in 28 fractions to the left breast using whole-breast tangent fields. This was delivered using a 3-D conformal technique. The patient then received a boost to the seroma. This delivered an additional 10 Gy in 5 fractions using 15E electrons with a special teletherapy technique. The total dose was 60.4 Gy.   10/19/2018 -  Anti-estrogen oral therapy   Letrozole 2.$RemoveBefo'5mg'yXtJJNNqkhb$  daily started 07/2018     Survivorship   Per Cira Rue, NP       INTERVAL HISTORY:  Delphia Grates is here for a follow up of breast cancer. She was last seen by me on 02/15/21. She presents to the clinic alone. She reports she is tired because she works 60-65 hours a week at [employer]. She is otherwise doing well with no new concerns. She notes she is restarting her Keto diet since holding for the holidays.   All other systems were reviewed with the patient and are negative.  MEDICAL HISTORY:  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)   Personal history of chemotherapy    Personal history of radiation therapy     SURGICAL HISTORY: Past Surgical History:  Procedure Laterality Date   BREAST LUMPECTOMY Left 04/03/2018   BREAST LUMPECTOMY WITH RADIOACTIVE SEED AND SENTINEL LYMPH NODE BIOPSY Left  04/03/2018   Procedure: LEFT BREAST LUMPECTOMY WITH RADIOACTIVE SEED AND LEFT SENTINEL LYMPH NODE BIOPSY;  Surgeon: [name], MD;  Location: [facility];  Service: General;  Laterality: Left;   IR IMAGING GUIDED PORT INSERTION  10/20/2017   IR US GUIDE VASC ACCESS LEFT  10/20/2017   MASTECTOMY Right 04/03/2018   MASTECTOMY MODIFIED RADICAL Right 04/03/2018   Procedure: MASTECTOMY MODIFIED RADICAL;  Surgeon: [name], MD;  Location: [facility];  Service: General;  Laterality: Right;   PORT-A-CATH REMOVAL N/A 04/03/2018   Procedure: REMOVAL PORT-A-CATH;  Surgeon: [name], MD;  Location: [place];  Service: General;  Laterality: N/A;   TONSILLECTOMY Bilateral    TUBAL LIGATION      I have reviewed the social history and family history with the patient and they are unchanged from previous note.  ALLERGIES:  is allergic to bee venom.  MEDICATIONS:  Current Outpatient Medications  Medication Sig  Dispense Refill   amLODipine (NORVASC) 5 MG tablet Take 1 tablet (5 mg total) by mouth daily. 90 tablet 1   b complex vitamins tablet Take 1 tablet by mouth daily.     benzonatate (TESSALON) 100 MG capsule Take 1 capsule (100 mg total) by mouth every 8 (eight) hours. 21 capsule 0   letrozole (FEMARA) 2.5 MG tablet TAKE 1 TABLET(2.5 MG) BY MOUTH DAILY 30 tablet 2   magnesium chloride (SLOW-MAG) 64 MG TBEC SR tablet Take by mouth.     magnesium citrate SOLN Take 1 Bottle by mouth once.     Nutritional Supplements (VITAMIN D BOOSTER PO) Take by mouth.     Ubiquinol (UBQH) 100 MG CAPS Take by mouth.     No current facility-administered medications for this visit.    PHYSICAL EXAMINATION: ECOG PERFORMANCE STATUS: 0 - Asymptomatic  Vitals:   08/16/21 0810  BP: (!) 157/70  Pulse: 76  Resp: 18  Temp: 98.6 F (37 C)  SpO2: 99%   Wt Readings from Last 3 Encounters:  08/16/21 151 lb 8 oz (68.7 kg)  02/15/21 144 lb 3.2 oz (65.4 kg)   01/26/21 145 lb (65.8 kg)     GENERAL:alert, no distress and comfortable SKIN: skin color, texture, turgor are normal, no rashes or significant lesions EYES: normal, Conjunctiva are pink and non-injected, sclera clear  NECK: supple, thyroid normal size, non-tender, without nodularity LYMPH:  no palpable lymphadenopathy in the cervical, axillary  LUNGS: clear to auscultation and percussion with normal breathing effort HEART: regular rate & rhythm and no murmurs and no lower extremity edema ABDOMEN:abdomen soft, non-tender and normal bowel sounds Musculoskeletal:no cyanosis of digits and no clubbing  NEURO: alert & oriented x 3 with fluent speech, no focal motor/sensory deficits BREAST: No palpable mass, nodules or adenopathy bilaterally. Breast exam benign.   LABORATORY DATA:  I have reviewed the data as listed CBC Latest Ref Rng & Units 08/16/2021 02/15/2021 07/20/2020  WBC 4.0 - 10.5 K/uL 6.5 5.4 6.9  Hemoglobin 12.0 - 15.0 g/dL 14.4 14.1 14.4  Hematocrit 36.0 - 46.0 % 43.3 41.9 42.1  Platelets 150 - 400 K/uL 241 230 197     CMP Latest Ref Rng & Units 08/16/2021 02/15/2021 07/20/2020  Glucose 70 - 99 mg/dL 105(H) 108(H) 98  BUN 8 - 23 mg/dL 22 21 29(H)  Creatinine 0.44 - 1.00 mg/dL 0.75 0.88 0.84  Sodium 135 - 145 mmol/L 142 142 141  Potassium 3.5 - 5.1 mmol/L 5.2(H) 4.2 4.6  Chloride 98 - 111 mmol/L 107 107 107  CO2 22 - 32 mmol/L $RemoveB'31 24 27  'MtHbjvlk$ Calcium 8.9 - 10.3 mg/dL 9.5 9.3 9.3  Total Protein 6.5 - 8.1 g/dL 6.4(L) 6.6 6.7  Total Bilirubin 0.3 - 1.2 mg/dL 0.6 0.6 0.5  Alkaline Phos 38 - 126 U/L 65 74 63  AST 15 - 41 U/L $Remo'15 17 15  'rAxUT$ ALT 0 - 44 U/L $Remo'14 15 16      'jkIDy$ RADIOGRAPHIC STUDIES: I have personally reviewed the radiological images as listed and agreed with the findings in the report. No results found.    Orders Placed This Encounter  Procedures   MM Digital Screening Unilat L    Standing Status:   Future    Standing Expiration Date:   08/16/2022    Order Specific Question:    Reason for Exam (SYMPTOM  OR DIAGNOSIS REQUIRED)    Answer:   screening    Order Specific Question:   Preferred imaging location?  Answer:   [facility] [place]   All questions were answered. The patient knows to call the clinic with any problems, questions or concerns. No barriers to learning was detected. The total time spent in the appointment was 30 minutes.     [name], MD 08/16/2021   I, Wilburn Mylar, am acting as scribe for [name], MD.   I have reviewed the above documentation for accuracy and completeness, and I agree with the above.

## 2021-08-27 ENCOUNTER — Other Ambulatory Visit: Payer: Self-pay | Admitting: Hematology

## 2021-08-27 DIAGNOSIS — I10 Essential (primary) hypertension: Secondary | ICD-10-CM

## 2021-08-27 DIAGNOSIS — Z170 Estrogen receptor positive status [ER+]: Secondary | ICD-10-CM

## 2021-12-20 ENCOUNTER — Telehealth: Payer: Self-pay | Admitting: Hematology

## 2021-12-20 NOTE — Telephone Encounter (Signed)
Unable to leave message with rescheduled upcoming appointment due to provider's template. Mailed calendar.

## 2021-12-21 ENCOUNTER — Other Ambulatory Visit: Payer: Self-pay

## 2021-12-21 ENCOUNTER — Telehealth: Payer: Self-pay

## 2021-12-21 DIAGNOSIS — Z170 Estrogen receptor positive status [ER+]: Secondary | ICD-10-CM

## 2021-12-21 MED ORDER — LETROZOLE 2.5 MG PO TABS: ORAL_TABLET | ORAL | 0 refills | Status: DC

## 2021-12-21 NOTE — Telephone Encounter (Signed)
Pt LVM requesting refill on Letrozole and stated her pharmacy does not have a new prescription.  Pt also stated she does not want to take any other medication but wants to remain on Letrozole.  Notified [name].

## 2022-01-02 ENCOUNTER — Encounter: Payer: Self-pay | Admitting: Hematology

## 2022-02-06 ENCOUNTER — Encounter: Payer: Self-pay | Admitting: Hematology

## 2022-02-13 ENCOUNTER — Other Ambulatory Visit: Payer: Self-pay

## 2022-02-13 ENCOUNTER — Inpatient Hospital Stay: Payer: Medicare Other | Attending: Hematology

## 2022-02-13 ENCOUNTER — Inpatient Hospital Stay (HOSPITAL_BASED_OUTPATIENT_CLINIC_OR_DEPARTMENT_OTHER): Payer: Medicare Other | Admitting: Hematology

## 2022-02-13 VITALS — BP 150/86 | HR 80 | Temp 98.6°F | Resp 18 | Ht 64.0 in | Wt 147.7 lb

## 2022-02-13 DIAGNOSIS — Z79810 Long term (current) use of selective estrogen receptor modulators (SERMs): Secondary | ICD-10-CM | POA: Diagnosis not present

## 2022-02-13 DIAGNOSIS — C50412 Malignant neoplasm of upper-outer quadrant of left female breast: Secondary | ICD-10-CM | POA: Diagnosis not present

## 2022-02-13 DIAGNOSIS — Z170 Estrogen receptor positive status [ER+]: Secondary | ICD-10-CM

## 2022-02-13 DIAGNOSIS — C50811 Malignant neoplasm of overlapping sites of right female breast: Secondary | ICD-10-CM

## 2022-02-13 DIAGNOSIS — C773 Secondary and unspecified malignant neoplasm of axilla and upper limb lymph nodes: Secondary | ICD-10-CM | POA: Diagnosis not present

## 2022-02-13 DIAGNOSIS — M810 Age-related osteoporosis without current pathological fracture: Secondary | ICD-10-CM | POA: Insufficient documentation

## 2022-02-13 DIAGNOSIS — F17210 Nicotine dependence, cigarettes, uncomplicated: Secondary | ICD-10-CM | POA: Diagnosis not present

## 2022-02-13 LAB — CMP (CANCER CENTER ONLY)
ALT: 13 U/L (ref 0–44)
AST: 17 U/L (ref 15–41)
Albumin: 4.2 g/dL (ref 3.5–5.0)
Alkaline Phosphatase: 58 U/L (ref 38–126)
Anion gap: 4 — ABNORMAL LOW (ref 5–15)
BUN: 19 mg/dL (ref 8–23)
CO2: 28 mmol/L (ref 22–32)
Calcium: 9.4 mg/dL (ref 8.9–10.3)
Chloride: 106 mmol/L (ref 98–111)
Creatinine: 0.81 mg/dL (ref 0.44–1.00)
GFR, Estimated: >60 mL/min (ref >60)
Glucose, Bld: 99 mg/dL (ref 70–99)
Potassium: 4.3 mmol/L (ref 3.5–5.1)
Sodium: 138 mmol/L (ref 135–145)
Total Bilirubin: 0.5 mg/dL (ref 0.3–1.2)
Total Protein: 6.5 g/dL (ref 6.5–8.1)

## 2022-02-13 LAB — CBC WITH DIFFERENTIAL (CANCER CENTER ONLY)
Abs Immature Granulocytes: 0.03 10*3/uL (ref 0.00–0.07)
Basophils Absolute: 0 10*3/uL (ref 0.0–0.1)
Basophils Relative: 0 %
Eosinophils Absolute: 0.1 10*3/uL (ref 0.0–0.5)
Eosinophils Relative: 1 %
HCT: 38.2 % (ref 36.0–46.0)
Hemoglobin: 13.2 g/dL (ref 12.0–15.0)
Immature Granulocytes: 0 %
Lymphocytes Relative: 13 %
Lymphs Abs: 1.4 10*3/uL (ref 0.7–4.0)
MCH: 31.7 pg (ref 26.0–34.0)
MCHC: 34.6 g/dL (ref 30.0–36.0)
MCV: 91.6 fL (ref 80.0–100.0)
Monocytes Absolute: 0.6 10*3/uL (ref 0.1–1.0)
Monocytes Relative: 5 %
Neutro Abs: 9.1 10*3/uL — ABNORMAL HIGH (ref 1.7–7.7)
Neutrophils Relative %: 81 %
Platelet Count: 178 10*3/uL (ref 150–400)
RBC: 4.17 MIL/uL (ref 3.87–5.11)
RDW: 13.4 % (ref 11.5–15.5)
WBC Count: 11.2 10*3/uL — ABNORMAL HIGH (ref 4.0–10.5)
nRBC: 0 % (ref 0.0–0.2)

## 2022-02-13 MED ORDER — NYSTATIN 100000 UNIT/GM EX POWD: 1.0000 | Freq: Two times a day (BID) | CUTANEOUS | 1 refills | Status: DC

## 2022-02-13 MED ORDER — TAMOXIFEN CITRATE 20 MG PO TABS
20.0000 mg | ORAL_TABLET | Freq: Every day | ORAL | 5 refills | Status: DC
Start: 2022-02-13 — End: 2022-08-16

## 2022-02-13 NOTE — Progress Notes (Signed)
Darlene Beasley   [phone] [phone]   Clinic Follow up Note   Patient Care Team: Pcp, No as PCP - General [name], MD as Consulting Physician (General Surgery) [name], MD as Consulting Physician (Hematology) Kyung Rudd, MD as Consulting Physician (Radiation Oncology) Alla Feeling, NP as Nurse Practitioner (Nurse Practitioner)  Date of Service:  02/13/2022  CHIEF COMPLAINT: f/u of bilateral breast cancers  CURRENT THERAPY:  Letrozole 2.$RemoveBefo'5mg'MqVVlmBpYWp$  daily starting 10/2018  -will switch to tamoxifen due to osteoporosis  ASSESSMENT & PLAN:  Darlene Beasley is a 66 y.o. post-menopausal female with   1. Bilateral breast cancer: right breast- invasive ductal carcinoma, grade 2, stage IIIA (cT3N1M0), ER+/PR-/HER2-, TFT7D2K; left breast - invasive ductal carcinoma, grade 1, stage IA (cT1bN0M0), ER+/PR+/HER2-, ypT1bN0 -Diagnosed in 09/2017. 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  -She is s/p neoadjuvant chemo with AC-T, left lumpectomy and right mastectomy. She had a very limited response to neoadjuvant chemotherapy. She also completed adjuvant radiation.  -She started antiestrogen therapy with Letrozole in 10/2018. Plan for 7 years. Tolerating well with manageable night sweats and fatigue. No change in joint stiffness.  -most recent left mammogram 02/21/21 was benign. -we discussed changing to tamoxifen today to help strengthen her bones. She is agreeable to try. I will call in today and she will start when she completes her current fill of letrozole. --The potential side effects, which includes but not limited to, hot flash, skin and vaginal dryness, slightly increased risk of cardiovascular disease and cataract, small risk of thrombosis and endometrial cancer, were discussed with her in great details. Preventive strategies for thrombosis, such as being physically active, using compression stocks, avoid cigarette smoking, etc., were reviewed with her. I also  recommend her to follow-up with her gynecologist once a year, and watch for vaginal spotting or bleeding, as a clinically sign of endometrial cancer, etc.  -She is clinically doing well from a breast cancer standpoint. Lab reviewed, overall no concern. Her physical exam was unremarkable.  -Continue breast cancer surveillance. Left mammogram scheduled for 02/27/22. She recently lost her Medicaid. I provided her with information about [facility] in case her insurance will not cover. -f/u in 6 months    2. Osteoporosis -Her 07/2018 DEXA shows osteopenia with lowest T-score of -2.1 at right femur neck. Repeat on 07/18/21 showed worsening to -2.6 -pt declined zometa or prolia  -will switch her to tamoxifen in the near future, which can strengthen her bones. -She continues with VitD and weight bearing exercise.   3. Smoking cessation  -She has a long history of smoking, >40 years. Smoking Cessation was repeatedly reviewed with her.  -She smokes 1/2 PPD now. She has not been able to reduce further (reviewed 02/13/22), I again encourage her to quit completely     Plan  -Continue Letrozole to finish current bottle, then switch to tamoxifen due to her osteoporosis, I called in today  -mammogram 02/2022 -Lab and f/u with NP Lacie in 6 months -I will reach out to [place] program for her free mammogram this year due to lack of insurance  -SW referral for her insurance coverage    No problem-specific Assessment & Plan notes found for this encounter.   SUMMARY OF ONCOLOGIC HISTORY: Oncology History Overview Note  Cancer Staging Cancer of overlapping sites of right female breast [facility]) Staging form: Breast, AJCC 8th Edition - Clinical stage from 09/23/2017: Stage IIIA (cT3, cN1, cM0, G2, ER+, PR-, HER2-) - Signed by Burr Medico,  Krista Blue, MD on 10/09/2017 - Pathologic stage from 04/03/2018: No Stage Recommended (ypT3, pN2a, cM0, G3, ER+, PR-, HER2-) - Signed by [name], MD on 05/03/2018  Malignant neoplasm of left breast in  female, estrogen receptor positive ([place]) Staging form: Breast, AJCC 8th Edition - Clinical: Stage IA (cT1b, cN0, cM0, G1, ER+, PR+, HER2-) - Unsigned - Pathologic stage from 04/03/2018: No Stage Recommended (ypT1b, pN0, cM0, G1, ER+, PR+, HER2-) - Signed by [name], MD on 05/03/2018     Cancer of overlapping sites of right female breast ([place])  09/09/2017 Mammogram   IMPRESSION: 1. Highly suspicious large right breast mass involving the outer and upper breast extending from approximately 9 o'clock through 12-1 o'clock, maximum measurement approximating 7.5 cm. The mass involves the right nipple, accounting for nipple retraction. Architectural distortion, microcalcifications and diffuse trabecular thickening and skin thickening are associated with the large mass, raising the possibility of this representing an inflammatory cancer. 2. Satellite masses separate from the dominant mass involving the lower outer quadrant and the upper inner quadrant of the right breast, measured above. 3. Two adjacent pathologic right axillary lymph nodes. 4. Indeterminate 0.9 cm solid mass involving the upper outer quadrant of the left breast which accounts for a mammographic finding. 5. No pathologic left axillary lymphadenopathy.   09/09/2017 Breast US   Targeted right breast ultrasound is performed, showing a very large hypoechoic mass with irregular margins extending from the approximate 9 o'clock position through the 12 to 1 o'clock position. On the CC gait image, the mass measures maximally approximately 7.5 cm. The mass has irregular margins, demonstrates acoustic shadowing, and demonstrates internal power Doppler flow. The mass extends into the retroareolar region and involves the nipple, accounting for the nipple retraction.   Separate from the dominant mass at the 7 o'clock position approximately 4 cm from nipple is a hypoechoic antiparallel mass with irregular margins measuring approximately 1.4 x 2.7 x 1.7  cm.  Separate from the dominant mass at the 1 o'clock position approximately 2 cm from nipple is a hypoechoic mass with irregular margins measuring approximately 1.7 x 0.5 x 1.6 cm. Both of these masses also demonstrate internal power Doppler flow.   Sonographic evaluation of the right axilla demonstrates 2 adjacent pathologic lymph nodes, the larger measuring approximately 2.5 x 2.5 x 2.8 cm, the smaller measuring approximately 2.0 x 0.8 x 2.7 cm.   09/23/2017 Initial Biopsy   Diagnosis 1. Breast, left, needle core biopsy, 2 o'clock - INVASIVE DUCTAL CARCINOMA. - SEE COMMENT. 2. Breast, right, needle core biopsy, 11:30 o'clock - INVASIVE MAMMARY CARCINOMA. - SEE COMMENT. 3. Lymph node, needle/core biopsy, right axilla - METASTATIC MAMMARY CARCINOMA IN 1 OF 1 LYMPH NODE (1/1). Microscopic Comment 1. There is a small focus of grade I invasive ductal carcinoma. A complete breast prognostic profile will be attempted and the results reported separately. The results were called to the [facility] on 09/24/2017. 2. The carcinoma appears grade II. An E-Cadherin stain and a breast prognostic profile will be performed on part 2 and the results reported separately. (JBK:kh 09-24-17) [name]  1. HER2 Negative by FISH Estrogen receptor: 100% positive, strong staining Progesterone receptor: 80% positive, strong staining Ki67: 5%  2. HER2 Negative by FISH Estrogen receptor: 100% positive, strong staining Progesterone receptor: 0%, negative  Ki67: 30% 2. The tumor cells are strongly positive for E-cadherin, supporting a ductal phenotype   09/23/2017 Cancer Staging   Staging form: Breast, AJCC 8th Edition - Clinical stage  from 09/23/2017: Stage IIIA (cT3, cN1, cM0, G2, ER+, PR-, HER2-) - Signed by [name], MD on 10/09/2017   10/07/2017 Breast MRI   IMPRESSION: 1. Biopsy proven malignancy involving all 4 quadrants of the right breast, although predominantly located in the anterior  third of the central upper right breast measures 6.8 x 9.6 x 7.2 cm.   2. Five pathologic right axillary lymph nodes, compatible with biopsy proven axillary metastases.   3. Small mass with associated biopsy related changes in the upper-outer left breast at site of known malignancy measures 0.9 x 0.8 x 0.7 cm. No abnormal lymph nodes seen in the left axilla.   RECOMMENDATION: Treatment plan for known bilateral breast malignancy.   BI-RADS CATEGORY  6: Known biopsy-proven malignancy.   10/08/2017 Initial Diagnosis   Cancer of overlapping sites of right female breast ([place])   10/13/2017 Pathology Results   Diagnosis 1. Breast, right, needle core biopsy, 1 o'clock - INVASIVE DUCTAL CARCINOMA. - SEE COMMENT. 2. Breast, right, needle core biopsy, 7 o'clock - INVASIVE DUCTAL CARCINOMA. - SEE COMMENT. Microscopic Comment 1. and 2. The carcinoma in parts 1 and 2 is morphologically similar and appears grade II. Because breast prognostic profiles were performed on the patients previous case, [id number], it will not be repeated on the current case unless requested.    10/17/2017 Imaging   CT CAP IMPRESSION: 1. Infiltrative right breast mass with overlying skin thickening is identified compatible with known breast cancer. 2. Enlarged right axillary lymph nodes compatible with metastatic adenopathy. 3. No additional sites of disease identified within the chest and no evidence for metastasis to the abdomen or pelvis. 4. Aortic atherosclerosis and 3 vessel coronary artery atherosclerotic calcifications. Aortic Atherosclerosis (ICD10-I70.0).   10/17/2017 Imaging   Bone Scan FINDINGS: There are no foci of increased or decreased radiotracer uptake to suggest osseous metastatic disease. 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 There is degenerative type uptake within both shoulders and the left ankle.    Normal physiologic activity is identified within the kidneys and urinary bladder.   IMPRESSION: No evidence of osseous metastatic disease.   10/22/2017 -  Chemotherapy   AC q2 weeks x4 cycles starting 10/22/17-12/03/17, followed by weekly taxol x12 12/17/17-03/11/18   11/14/2017 Genetic Testing   The genes that were analyzed were the 83 genes on [facility]'s Multi-Cancer panel (ALK, APC, ATM, AXIN2, BAP1, BARD1, BLM, BMPR1A, BRCA1, BRCA2, BRIP1, CASR, CDC73, CDH1, CDK4, CDKN1B, CDKN1C, CDKN2A, CEBPA, CHEK2, CTNNA1, DICER1, DIS3L2, EGFR, EPCAM, FH, FLCN, GATA2, GPC3, GREM1, HOXB13, HRAS, KIT, MAX, MEN1, MET, MITF, MLH1, MSH2, MSH3, MSH6, MUTYH, NBN, NF1, NF2, NTHL1, PALB2, PDGFRA, PHOX2B, PMS2, POLD1, POLE, POT1, PRKAR1A, PTCH1, PTEN, RAD50, RAD51C, RAD51D, RB1, RECQL4, RET, RUNX1, SDHA, SDHAF2, SDHB, SDHC, SDHD, SMAD4, SMARCA4, SMARCB1, SMARCE1, STK11, SUFU, TERC, TERT, TMEM127, TP53, TSC1, TSC2, VHL, WRN, WT1).  Testing revealed a pathogenic mutation in the MITF gene called c.952G>A (p.Glu318Lys) and A Variant of Unknown Significance (VUS) was also detected: POLD1 c.301A>T (p.Ile101Phe).   12/16/2017 Breast US   IMPRESSION: 1. Stable to minimal decrease in size of multiple irregular masses throughout the right breast consistent with the patient's sites of biopsy proven malignancy. While the mammographic appearance is slightly less dense in this region, there is increased skin and trabecular thickening involving the entire right breast. 2. Stable appearance of morphologically abnormal right axillary lymph nodes, consistent with biopsy proven metastatic disease. 3. Stable appearance of  a left breast mass, consistent with biopsy proven malignancy.     04/03/2018 Cancer Staging   Staging form: Breast, AJCC 8th Edition - Pathologic stage from 04/03/2018: No Stage Recommended (ypT3, pN2a, cM0, G3, ER+, PR-, HER2-) - Signed by [name], MD on 05/03/2018   10/19/2018 -  Anti-estrogen oral therapy   Adjuvant  letrozole 2.$RemoveBefo'5mg'dACbCqOcJmK$  daily     Survivorship   Per Cira Rue, NP    Malignant neoplasm of left breast in female, estrogen receptor positive ([place])  09/09/2017 Breast US   Targeted left breast ultrasound is performed, showing an oval parallel hypoechoic mass containing microcalcifications at the 2 o'clock position approximately 6 cm from nipple measuring approximately 0.9 x 0.5 x 0.9 cm, corresponding to the mammographic finding.   Sonographic evaluation of the left axilla demonstrates no pathologic lymphadenopathy.   09/23/2017 Initial Biopsy   Diagnosis 1. Breast, left, needle core biopsy, 2 o'clock - INVASIVE DUCTAL CARCINOMA. - SEE COMMENT. 2. Breast, right, needle core biopsy, 11:30 o'clock - INVASIVE MAMMARY CARCINOMA. - SEE COMMENT. 3. Lymph node, needle/core biopsy, right axilla - METASTATIC MAMMARY CARCINOMA IN 1 OF 1 LYMPH NODE (1/1). Microscopic Comment 1. There is a small focus of grade I invasive ductal carcinoma. A complete breast prognostic profile will be attempted and the results reported separately. The results were called to the [facility] on 09/24/2017. 2. The carcinoma appears grade II. An E-Cadherin stain and a breast prognostic profile will be performed on part 2 and the results reported separately. (JBK:kh 09-24-17) [name]  1. HER2 Negative by FISH Estrogen receptor: 100% positive, strong staining Progesterone receptor: 80% positive, strong staining Ki67: 5%  2. HER2 Negative by FISH Estrogen receptor: 100% positive, strong staining Progesterone receptor: 0%, negative  Ki67: 30%   10/08/2017 Initial Diagnosis   Malignant neoplasm of left breast in female, estrogen receptor positive ([place])   12/16/2017 Breast US   IMPRESSION: 3. Stable appearance of a left breast mass, consistent with biopsy proven malignancy.     01/22/2018 Imaging   01/22/2018 MRI Bilateral Breast IMPRESSION: 1. Dominant enhancing mass with surrounding nodularity involving  the central anterior third of the right breast now measures up to 6.4 x 6.7 x 7.6 cm (previously measured 6.8 x 9.6 x 7.2 cm). Persistent bulky right axillary lymphadenopathy, slightly decreased in size since prior MRI.   2. Biopsy proven left breast malignancy now measures 0.5 cm (previously measured 0.9 x 0.8 x 0.7 cm).    04/03/2018 Pathology Results   Diagnosis 1. Breast, lumpectomy, left with radioactive seed - RESIDUAL INVASIVE DUCTAL CARCINOMA STATUS POST NEOADJUVANT THERAPY (0.9 CM) - MARGINS UNINVOLVED BY CARCINOMA (0.2 CM; INFERIOR MARGIN) - CALCIFICATIONS ASSOCIATED WITH CARCINOMA - PREVIOUS BIOPSY SITE CHANGES PRESENT - SEE ONCOLOGY TABLE AND COMMENT BELOW 2. Lymph node, sentinel, biopsy, left axillary #1 - NO CARCINOMA IDENTIFIED IN ONE LYMPH NODE (0/1) - SEE COMMENT 3. Lymph node, sentinel, biopsy, left axillary #2 - NO CARCINOMA IDENTIFIED IN ONE LYMPH NODE (0/1) - SEE COMMENT 4. Lymph node, sentinel, biopsy, left - NO CARCINOMA IDENTIFIED IN ONE LYMPH NODE (0/1) - SEE COMMENT 5. Lymph node, sentinel, biopsy, left axillary #3 - NO CARCINOMA IDENTIFIED IN ONE LYMPH NODE (0/1) - SEE COMMENT 6. Breast, modified radical mastectomy , right - RESIDUAL INVASIVE DUCTAL CARCINOMA STATUS POST NEOADJUVANT THERAPY (8.5 CM) - EXTENSIVE LYMPHOVASCULAR SPACE INVASION PRESENT - METASTATIC CARCINOMA INVOLVING FIVE OF FIFTEEN LYMPH NODES WITH EXTRACAPSULAR EXTENSION (5/15) - MARGINS UNINVOLVED BY CARCINOMA - SEE  ONCOLOGY TABLE AND COMMENT BELOW   04/03/2018 Receptors her2   The tumor cells are NEGATIVE for Her2 (1+). Estrogen Receptor: 95%, POSITIVE, STRONG STAINING INTENSITY Progesterone Receptor: 95%, POSITIVE, STRONG STAINING INTENSITY  The tumor cells are NEGATIVE for Her2 (1+). Estrogen Receptor: 100%, POSITIVE, STRONG STAINING INTENSITY Progesterone Receptor: 0%, NEGATIVE   04/03/2018 Cancer Staging   Staging form: Breast, AJCC 8th Edition - Pathologic stage from  04/03/2018: No Stage Recommended (ypT1b, pN0, cM0, G1, ER+, PR+, HER2-) - Signed by [name], MD on 05/03/2018   04/03/2018 Surgery   Left LEFT BREAST LUMPECTOMY WITH RADIOACTIVE SEED AND LEFT SENTINEL LYMPH NODE BIOPSY with [name], MD    05/20/2018 - 07/08/2018 Radiation Therapy   Daily adjuvant radiation treatment with Dr.Moody.    Radiation treatment dates:   05/20/2018 - 07/08/2018   Site/dose:   The patient initially received a dose of 50.4 Gy in 28 fractions to the right chest wall and supraclavicular region. This was delivered using a 3-D conformal, 4 field technique. The patient then received a boost to the mastectomy scar. This delivered an additional 10 Gy in 5 fractions using an en face electron field. The total dose was 60.4 Gy. At the same time, the patient received a dose of 50.4 Gy in 28 fractions to the left breast using whole-breast tangent fields. This was delivered using a 3-D conformal technique. The patient then received a boost to the seroma. This delivered an additional 10 Gy in 5 fractions using 15E electrons with a special teletherapy technique. The total dose was 60.4 Gy.   10/19/2018 -  Anti-estrogen oral therapy   Letrozole 2.$RemoveBefo'5mg'DODlDDhczxU$  daily started 07/2018     Survivorship   Per Cira Rue, NP       INTERVAL HISTORY:  Darlene Beasley is here for a follow up of breast cancer. She was last seen by me on 08/16/21. She presents to the clinic alone. She reports a new rash on her chest and back. She tells me she has been under a lot of stress. Her son recently passed away in a car accident.   All other systems were reviewed with the patient and are negative.  MEDICAL HISTORY:  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   GERD (gastroesophageal reflux disease)    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)   Personal history of chemotherapy    Personal history of radiation therapy     SURGICAL HISTORY: Past Surgical History:  Procedure Laterality Date   BREAST LUMPECTOMY Left 04/03/2018   BREAST LUMPECTOMY WITH RADIOACTIVE SEED AND SENTINEL LYMPH NODE BIOPSY Left 04/03/2018   Procedure: LEFT BREAST LUMPECTOMY WITH RADIOACTIVE SEED AND LEFT SENTINEL LYMPH NODE BIOPSY;  Surgeon: [name], MD;  Location: [facility] [place];  Service: General;  Laterality: Left;   IR IMAGING GUIDED PORT INSERTION  10/20/2017   IR US GUIDE VASC ACCESS LEFT  10/20/2017   MASTECTOMY Right 04/03/2018   MASTECTOMY MODIFIED RADICAL Right 04/03/2018   Procedure: MASTECTOMY MODIFIED RADICAL;  Surgeon: [name], MD;  Location: [facility];  Service: General;  Laterality: Right;   PORT-A-CATH REMOVAL N/A 04/03/2018   Procedure: REMOVAL PORT-A-CATH;  Surgeon: [name], MD;  Location: [facility];  Service: General;  Laterality: N/A;   TONSILLECTOMY Bilateral    TUBAL LIGATION      I have reviewed the social history and family history with the patient and they are unchanged from previous note.  ALLERGIES:  is allergic to bee venom.  MEDICATIONS:  Current Outpatient Medications  Medication Sig Dispense Refill   nystatin (MYCOSTATIN/NYSTOP) powder Apply 1 Application topically 2 (two) times daily. Apply to skin rashes underneath breasts 30 g 1   tamoxifen (NOLVADEX) 20 MG tablet Take 1 tablet (20 mg total) by mouth daily. 30 tablet 5   amLODipine (NORVASC) 5 MG tablet TAKE 1 TABLET(5 MG) BY MOUTH DAILY 90 tablet 1   b complex vitamins tablet Take 1 tablet by mouth daily.     benzonatate (TESSALON) 100 MG capsule Take 1 capsule (100 mg total) by mouth every 8 (eight) hours. 21 capsule 0   magnesium chloride (SLOW-MAG) 64 MG TBEC SR tablet Take by mouth.     magnesium citrate SOLN Take 1 Bottle by mouth once.      Nutritional Supplements (VITAMIN D BOOSTER PO) Take by mouth.     Ubiquinol (UBQH) 100 MG CAPS Take by mouth.     No current facility-administered medications for this visit.    PHYSICAL EXAMINATION: ECOG PERFORMANCE STATUS: 0 - Asymptomatic  Vitals:   02/13/22 1315  BP: (!) 150/86  Pulse: 80  Resp: 18  Temp: 98.6 F (37 C)  SpO2: 98%   Wt Readings from Last 3 Encounters:  02/13/22 147 lb 11.2 oz (67 kg)  08/16/21 151 lb 8 oz (68.7 kg)  02/15/21 144 lb 3.2 oz (65.4 kg)     GENERAL:alert, no distress and comfortable SKIN: skin color, texture, turgor are normal, no rashes or significant lesions EYES: normal, Conjunctiva are pink and non-injected, sclera clear  NECK: supple, thyroid normal size, non-tender, without nodularity LYMPH:  no palpable lymphadenopathy in the cervical, axillary LUNGS: clear to auscultation and percussion with normal breathing effort HEART: regular rate & rhythm and no murmurs and no lower extremity edema ABDOMEN:abdomen soft, non-tender and normal bowel sounds Musculoskeletal:no cyanosis of digits and no clubbing  NEURO: alert & oriented x 3 with fluent speech, no focal motor/sensory deficits BREAST: No palpable mass, nodules or adenopathy bilaterally. Breast exam benign.   LABORATORY DATA:  I have reviewed the data as listed    Latest Ref Rng & Units 02/13/2022    1:05 PM 08/16/2021    7:47 AM 02/15/2021    8:05 AM  CBC  WBC 4.0 - 10.5 K/uL 11.2  6.5  5.4   Hemoglobin 12.0 - 15.0 g/dL 13.2  14.4  14.1   Hematocrit 36.0 - 46.0 % 38.2  43.3  41.9   Platelets 150 - 400 K/uL 178  241  230         Latest Ref Rng & Units 02/13/2022    1:05 PM 08/16/2021    7:47 AM 02/15/2021    8:05 AM  CMP  Glucose 70 - 99 mg/dL 99  105  108   BUN 8 - 23 mg/dL $Remove'19  22  21   'GxDwBOm$ Creatinine 0.44 - 1.00 mg/dL 0.81  0.75  0.88   Sodium 135 - 145 mmol/L 138  142  142   Potassium 3.5 - 5.1 mmol/L 4.3  5.2  4.2   Chloride 98 - 111 mmol/L 106  107  107   CO2 22 - 32 mmol/L  $Remov'28  31  24   'swYWDc$ Calcium 8.9 - 10.3 mg/dL  9.4  9.5  9.3   Total Protein 6.5 - 8.1 g/dL 6.5  6.4  6.6   Total Bilirubin 0.3 - 1.2 mg/dL 0.5  0.6  0.6   Alkaline Phos 38 - 126 U/L 58  65  74   AST 15 - 41 U/L $Remo'17  15  17   'jhpVN$ ALT 0 - 44 U/L $Remo'13  14  15       'BDYky$ RADIOGRAPHIC STUDIES: I have personally reviewed the radiological images as listed and agreed with the findings in the report. No results found.    No orders of the defined types were placed in this encounter.  All questions were answered. The patient knows to call the clinic with any problems, questions or concerns. No barriers to learning was detected. The total time spent in the appointment was 30 minutes.     [name], MD 02/13/2022   I, Wilburn Mylar, am acting as scribe for [name], MD.   I have reviewed the above documentation for accuracy and completeness, and I agree with the above.

## 2022-02-18 ENCOUNTER — Telehealth: Payer: Self-pay | Admitting: Hematology

## 2022-02-18 NOTE — Telephone Encounter (Signed)
Scheduled follow-up appointment per 9/6 los Patient is aware. Mailed calendar.

## 2022-02-20 ENCOUNTER — Ambulatory Visit: Payer: Medicaid Other | Admitting: Hematology

## 2022-02-20 ENCOUNTER — Other Ambulatory Visit: Payer: Medicaid Other

## 2022-02-27 ENCOUNTER — Ambulatory Visit: Payer: Medicaid Other

## 2022-03-20 ENCOUNTER — Ambulatory Visit
Admission: RE | Admit: 2022-03-20 | Discharge: 2022-03-20 | Disposition: A | Discharge status: Home / Self Care | Payer: Medicaid Other | Source: Ambulatory Visit | Attending: Hematology | Admitting: Hematology

## 2022-03-20 DIAGNOSIS — Z1231 Encounter for screening mammogram for malignant neoplasm of breast: Secondary | ICD-10-CM

## 2022-03-21 ENCOUNTER — Other Ambulatory Visit: Payer: Self-pay | Admitting: Hematology

## 2022-03-21 DIAGNOSIS — I10 Essential (primary) hypertension: Secondary | ICD-10-CM

## 2022-06-12 ENCOUNTER — Ambulatory Visit: Payer: Self-pay | Attending: Radiation Oncology

## 2022-06-14 ENCOUNTER — Encounter: Payer: Self-pay | Admitting: Hematology

## 2022-07-17 ENCOUNTER — Encounter: Payer: Self-pay | Admitting: Hematology

## 2022-08-09 ENCOUNTER — Encounter: Payer: Self-pay | Admitting: Hematology

## 2022-08-16 ENCOUNTER — Inpatient Hospital Stay (HOSPITAL_BASED_OUTPATIENT_CLINIC_OR_DEPARTMENT_OTHER): Payer: 59 | Admitting: Hematology

## 2022-08-16 ENCOUNTER — Encounter: Payer: Self-pay | Admitting: Hematology

## 2022-08-16 ENCOUNTER — Inpatient Hospital Stay: Payer: 59 | Attending: Hematology

## 2022-08-16 ENCOUNTER — Other Ambulatory Visit: Payer: Self-pay

## 2022-08-16 VITALS — BP 131/84 | HR 82 | Temp 97.8°F | Resp 18 | Ht 64.0 in | Wt 153.2 lb

## 2022-08-16 DIAGNOSIS — Z1501 Genetic susceptibility to malignant neoplasm of breast: Secondary | ICD-10-CM | POA: Insufficient documentation

## 2022-08-16 DIAGNOSIS — C50811 Malignant neoplasm of overlapping sites of right female breast: Secondary | ICD-10-CM

## 2022-08-16 DIAGNOSIS — Z79810 Long term (current) use of selective estrogen receptor modulators (SERMs): Secondary | ICD-10-CM | POA: Insufficient documentation

## 2022-08-16 DIAGNOSIS — Z170 Estrogen receptor positive status [ER+]: Secondary | ICD-10-CM | POA: Diagnosis not present

## 2022-08-16 DIAGNOSIS — M85851 Other specified disorders of bone density and structure, right thigh: Secondary | ICD-10-CM | POA: Insufficient documentation

## 2022-08-16 DIAGNOSIS — Z1509 Genetic susceptibility to other malignant neoplasm: Secondary | ICD-10-CM | POA: Insufficient documentation

## 2022-08-16 LAB — CBC WITH DIFFERENTIAL (CANCER CENTER ONLY)
Abs Immature Granulocytes: 0.03 10*3/uL (ref 0.00–0.07)
Basophils Absolute: 0 10*3/uL (ref 0.0–0.1)
Basophils Relative: 1 %
Eosinophils Absolute: 0.2 10*3/uL (ref 0.0–0.5)
Eosinophils Relative: 3 %
HCT: 39 % (ref 36.0–46.0)
Hemoglobin: 13.1 g/dL (ref 12.0–15.0)
Immature Granulocytes: 1 %
Lymphocytes Relative: 32 %
Lymphs Abs: 2 10*3/uL (ref 0.7–4.0)
MCH: 31 pg (ref 26.0–34.0)
MCHC: 33.6 g/dL (ref 30.0–36.0)
MCV: 92.4 fL (ref 80.0–100.0)
Monocytes Absolute: 0.3 10*3/uL (ref 0.1–1.0)
Monocytes Relative: 5 %
Neutro Abs: 3.7 10*3/uL (ref 1.7–7.7)
Neutrophils Relative %: 58 %
Platelet Count: 203 10*3/uL (ref 150–400)
RBC: 4.22 MIL/uL (ref 3.87–5.11)
RDW: 14.1 % (ref 11.5–15.5)
WBC Count: 6.3 10*3/uL (ref 4.0–10.5)
nRBC: 0 % (ref 0.0–0.2)

## 2022-08-16 LAB — CMP (CANCER CENTER ONLY)
ALT: 13 U/L (ref 0–44)
AST: 15 U/L (ref 15–41)
Albumin: 4.1 g/dL (ref 3.5–5.0)
Alkaline Phosphatase: 42 U/L (ref 38–126)
Anion gap: 6 (ref 5–15)
BUN: 21 mg/dL (ref 8–23)
CO2: 27 mmol/L (ref 22–32)
Calcium: 9.4 mg/dL (ref 8.9–10.3)
Chloride: 107 mmol/L (ref 98–111)
Creatinine: 0.8 mg/dL (ref 0.44–1.00)
GFR, Estimated: >60 mL/min (ref >60)
Glucose, Bld: 95 mg/dL (ref 70–99)
Potassium: 4.6 mmol/L (ref 3.5–5.1)
Sodium: 140 mmol/L (ref 135–145)
Total Bilirubin: 0.4 mg/dL (ref 0.3–1.2)
Total Protein: 6.4 g/dL — ABNORMAL LOW (ref 6.5–8.1)

## 2022-08-16 MED ORDER — TAMOXIFEN CITRATE 20 MG PO TABS: 20.0000 mg | ORAL_TABLET | Freq: Every day | ORAL | 1 refills | Status: DC

## 2022-08-16 NOTE — Progress Notes (Unsigned)
[place]   [phone] [phone]   Clinic Follow up Note   Patient Care Team: Pcp, No as PCP - General [name], MD as Consulting Physician (General Surgery) [name], MD as Consulting Physician (Hematology) Kyung Rudd, MD as Consulting Physician (Radiation Oncology) Alla Feeling, NP as Nurse Practitioner (Nurse Practitioner)  Date of Service:  08/16/2022  CHIEF COMPLAINT: f/u of right breast cancer  CURRENT THERAPY:  Tamoxifen, started in September 2023.  She was previously on letrozole from May 2022 to September 2023.  ASSESSMENT:  Darlene Beasley is a 67 y.o. female with   Cancer of overlapping sites of right female breast ([place]) stage IIIA (cT3N1M0), ER+/PR-/HER2-, [id number]; left breast - invasive ductal carcinoma, grade 1, stage IA (cT1bN0M0), ER+/PR+/HER2-, ypT1bN0 -Diagnosed in 09/2017. 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  -She is s/p neoadjuvant chemo with AC-T, left lumpectomy and right mastectomy. She had a very limited response to neoadjuvant chemotherapy. She also completed adjuvant radiation.  -She started antiestrogen therapy with Letrozole in 10/2018. Plan for 7 years.  She tolerated well.  Due to her osteoporosis, I changed letrozole to tamoxifen in September 2023. -most recent left mammogram 03/2022 was benign. -She is clinically doing well, lab reviewed, physical exam was unremarkable, there is no clinical concern for recurrence. -She is tolerating tamoxifen well, will continue.  Osteoporosis  -Her 07/2018 DEXA shows osteopenia with lowest T-score of -2.1 at right femur neck.  -repeated DEXA IN 07/2021 showed osteoporosis with T-score -2.6 -She previously declined bisphosphonate, I again encouraged her to take zometa, she has not seen dentistry for a while, wants to remove the residual disease and hypertension.  I encouraged her to follow-up with dentist as soon as possible. -Continue calcium and VitD and weight  bearing exercise   PLAN: -Continue tamoxifen -Lab and follow-up in 6 months with NP Lacie    SUMMARY OF ONCOLOGIC HISTORY: Oncology History Overview Note  Cancer Staging Cancer of overlapping sites of right female breast [facility]) Staging form: Breast, AJCC 8th Edition - Clinical stage from 09/23/2017: Stage IIIA (cT3, cN1, cM0, G2, ER+, PR-, HER2-) - Signed by [name], MD on 10/09/2017 - Pathologic stage from 04/03/2018: No Stage Recommended (ypT3, pN2a, cM0, G3, ER+, PR-, HER2-) - Signed by [name], MD on 05/03/2018  Malignant neoplasm of left breast in female, estrogen receptor positive ([place]) Staging form: Breast, AJCC 8th Edition - Clinical: Stage IA (cT1b, cN0, cM0, G1, ER+, PR+, HER2-) - Unsigned - Pathologic stage from 04/03/2018: No Stage Recommended (ypT1b, pN0, cM0, G1, ER+, PR+, HER2-) - Signed by [name], MD on 05/03/2018     Cancer of overlapping sites of right female breast ([place])  09/09/2017 Mammogram   IMPRESSION: 1. Highly suspicious large right breast mass involving the outer and upper breast extending from approximately 9 o'clock through 12-1 o'clock, maximum measurement approximating 7.5 cm. The mass involves the right nipple, accounting for nipple retraction. Architectural distortion, microcalcifications and diffuse trabecular thickening and skin thickening are associated with the large mass, raising the possibility of this representing an inflammatory cancer. 2. Satellite masses separate from the dominant mass involving the lower outer quadrant and the upper inner quadrant of the right breast, measured above. 3. Two adjacent pathologic right axillary lymph nodes. 4. Indeterminate 0.9 cm solid mass involving the upper outer quadrant of the left breast which accounts for a mammographic finding. 5. No pathologic left axillary lymphadenopathy.   09/09/2017 Breast US   Targeted right  breast ultrasound is performed, showing a very large hypoechoic mass with irregular  margins extending from the approximate 9 o'clock position through the 12 to 1 o'clock position. On the CC gait image, the mass measures maximally approximately 7.5 cm. The mass has irregular margins, demonstrates acoustic shadowing, and demonstrates internal power Doppler flow. The mass extends into the retroareolar region and involves the nipple, accounting for the nipple retraction.   Separate from the dominant mass at the 7 o'clock position approximately 4 cm from nipple is a hypoechoic antiparallel mass with irregular margins measuring approximately 1.4 x 2.7 x 1.7 cm.  Separate from the dominant mass at the 1 o'clock position approximately 2 cm from nipple is a hypoechoic mass with irregular margins measuring approximately 1.7 x 0.5 x 1.6 cm. Both of these masses also demonstrate internal power Doppler flow.   Sonographic evaluation of the right axilla demonstrates 2 adjacent pathologic lymph nodes, the larger measuring approximately 2.5 x 2.5 x 2.8 cm, the smaller measuring approximately 2.0 x 0.8 x 2.7 cm.   09/23/2017 Initial Biopsy   Diagnosis 1. Breast, left, needle core biopsy, 2 o'clock - INVASIVE DUCTAL CARCINOMA. - SEE COMMENT. 2. Breast, right, needle core biopsy, 11:30 o'clock - INVASIVE MAMMARY CARCINOMA. - SEE COMMENT. 3. Lymph node, needle/core biopsy, right axilla - METASTATIC MAMMARY CARCINOMA IN 1 OF 1 LYMPH NODE (1/1). Microscopic Comment 1. There is a small focus of grade I invasive ductal carcinoma. A complete breast prognostic profile will be attempted and the results reported separately. The results were called to the [place] on 09/24/2017. 2. The carcinoma appears grade II. An E-Cadherin stain and a breast prognostic profile will be performed on part 2 and the results reported separately. (JBK:kh 09-24-17) [name]  1. HER2 Negative by FISH Estrogen receptor: 100% positive, strong staining Progesterone receptor: 80% positive, strong  staining Ki67: 5%  2. HER2 Negative by FISH Estrogen receptor: 100% positive, strong staining Progesterone receptor: 0%, negative  Ki67: 30% 2. The tumor cells are strongly positive for E-cadherin, supporting a ductal phenotype   09/23/2017 Cancer Staging   Staging form: Breast, AJCC 8th Edition - Clinical stage from 09/23/2017: Stage IIIA (cT3, cN1, cM0, G2, ER+, PR-, HER2-) - Signed by [name], MD on 10/09/2017   10/07/2017 Breast MRI   IMPRESSION: 1. Biopsy proven malignancy involving all 4 quadrants of the right breast, although predominantly located in the anterior third of the central upper right breast measures 6.8 x 9.6 x 7.2 cm.   2. Five pathologic right axillary lymph nodes, compatible with biopsy proven axillary metastases.   3. Small mass with associated biopsy related changes in the upper-outer left breast at site of known malignancy measures 0.9 x 0.8 x 0.7 cm. No abnormal lymph nodes seen in the left axilla.   RECOMMENDATION: Treatment plan for known bilateral breast malignancy.   BI-RADS CATEGORY  6: Known biopsy-proven malignancy.   10/08/2017 Initial Diagnosis   Cancer of overlapping sites of right female breast ([place])   10/13/2017 Pathology Results   Diagnosis 1. Breast, right, needle core biopsy, 1 o'clock - INVASIVE DUCTAL CARCINOMA. - SEE COMMENT. 2. Breast, right, needle core biopsy, 7 o'clock - INVASIVE DUCTAL CARCINOMA. - SEE COMMENT. Microscopic Comment 1. and 2. The carcinoma in parts 1 and 2 is morphologically similar and appears grade II. Because breast prognostic profiles were performed on the patients previous case, [id number], it will not be repeated on the current case unless requested.    10/17/2017  Imaging   CT CAP IMPRESSION: 1. Infiltrative right breast mass with overlying skin thickening is identified compatible with known breast cancer. 2. Enlarged right axillary lymph nodes compatible with metastatic adenopathy. 3. No additional sites of  disease identified within the chest and no evidence for metastasis to the abdomen or pelvis. 4. Aortic atherosclerosis and 3 vessel coronary artery atherosclerotic calcifications. Aortic Atherosclerosis (ICD10-I70.0).   10/17/2017 Imaging   Bone Scan FINDINGS: There are no foci of increased or decreased radiotracer uptake to suggest osseous metastatic disease. 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 There is degenerative type uptake within both shoulders and the left ankle.   Normal physiologic activity is identified within the kidneys and urinary bladder.   IMPRESSION: No evidence of osseous metastatic disease.   10/22/2017 -  Chemotherapy   AC q2 weeks x4 cycles starting 10/22/17-12/03/17, followed by weekly taxol x12 12/17/17-03/11/18   11/14/2017 Genetic Testing   The genes that were analyzed were the 83 genes on [facility]'s Multi-Cancer panel (ALK, APC, ATM, AXIN2, BAP1, BARD1, BLM, BMPR1A, BRCA1, BRCA2, BRIP1, CASR, CDC73, CDH1, CDK4, CDKN1B, CDKN1C, CDKN2A, CEBPA, CHEK2, CTNNA1, DICER1, DIS3L2, EGFR, EPCAM, FH, FLCN, GATA2, GPC3, GREM1, HOXB13, HRAS, KIT, MAX, MEN1, MET, MITF, MLH1, MSH2, MSH3, MSH6, MUTYH, NBN, NF1, NF2, NTHL1, PALB2, PDGFRA, PHOX2B, PMS2, POLD1, POLE, POT1, PRKAR1A, PTCH1, PTEN, RAD50, RAD51C, RAD51D, RB1, RECQL4, RET, RUNX1, SDHA, SDHAF2, SDHB, SDHC, SDHD, SMAD4, SMARCA4, SMARCB1, SMARCE1, STK11, SUFU, TERC, TERT, TMEM127, TP53, TSC1, TSC2, VHL, WRN, WT1).  Testing revealed a pathogenic mutation in the MITF gene called c.952G>A (p.Glu318Lys) and A Variant of Unknown Significance (VUS) was also detected: POLD1 c.301A>T (p.Ile101Phe).   12/16/2017 Breast US   IMPRESSION: 1. Stable to minimal decrease in size of multiple irregular masses throughout the right breast consistent with the patient's sites of biopsy proven malignancy. While the mammographic appearance is slightly less  dense in this region, there is increased skin and trabecular thickening involving the entire right breast. 2. Stable appearance of morphologically abnormal right axillary lymph nodes, consistent with biopsy proven metastatic disease. 3. Stable appearance of a left breast mass, consistent with biopsy proven malignancy.     04/03/2018 Cancer Staging   Staging form: Breast, AJCC 8th Edition - Pathologic stage from 04/03/2018: No Stage Recommended (ypT3, pN2a, cM0, G3, ER+, PR-, HER2-) - Signed by [name], MD on 05/03/2018   10/19/2018 -  Anti-estrogen oral therapy   Adjuvant letrozole 2.'5mg'$  daily     Survivorship   Per Cira Rue, NP    Malignant neoplasm of left breast in female, estrogen receptor positive ([place])  09/09/2017 Breast US   Targeted left breast ultrasound is performed, showing an oval parallel hypoechoic mass containing microcalcifications at the 2 o'clock position approximately 6 cm from nipple measuring approximately 0.9 x 0.5 x 0.9 cm, corresponding to the mammographic finding.   Sonographic evaluation of the left axilla demonstrates no pathologic lymphadenopathy.   09/23/2017 Initial Biopsy   Diagnosis 1. Breast, left, needle core biopsy, 2 o'clock - INVASIVE DUCTAL CARCINOMA. - SEE COMMENT. 2. Breast, right, needle core biopsy, 11:30 o'clock - INVASIVE MAMMARY CARCINOMA. - SEE COMMENT. 3. Lymph node, needle/core biopsy, right axilla - METASTATIC MAMMARY CARCINOMA IN 1 OF 1 LYMPH NODE (1/1). Microscopic Comment 1. There is a small focus of grade I invasive ductal carcinoma. A complete breast prognostic profile will be attempted and the results reported separately. The results were called to  the [place] on 09/24/2017. 2. The carcinoma appears grade II. An E-Cadherin stain and a breast prognostic profile will be performed on part 2 and the results reported separately. (JBK:kh 09-24-17) [name]  1. HER2 Negative by FISH Estrogen receptor: 100%  positive, strong staining Progesterone receptor: 80% positive, strong staining Ki67: 5%  2. HER2 Negative by FISH Estrogen receptor: 100% positive, strong staining Progesterone receptor: 0%, negative  Ki67: 30%   10/08/2017 Initial Diagnosis   Malignant neoplasm of left breast in female, estrogen receptor positive ([place])   12/16/2017 Breast US   IMPRESSION: 3. Stable appearance of a left breast mass, consistent with biopsy proven malignancy.     01/22/2018 Imaging   01/22/2018 MRI Bilateral Breast IMPRESSION: 1. Dominant enhancing mass with surrounding nodularity involving the central anterior third of the right breast now measures up to 6.4 x 6.7 x 7.6 cm (previously measured 6.8 x 9.6 x 7.2 cm). Persistent bulky right axillary lymphadenopathy, slightly decreased in size since prior MRI.   2. Biopsy proven left breast malignancy now measures 0.5 cm (previously measured 0.9 x 0.8 x 0.7 cm).    04/03/2018 Pathology Results   Diagnosis 1. Breast, lumpectomy, left with radioactive seed - RESIDUAL INVASIVE DUCTAL CARCINOMA STATUS POST NEOADJUVANT THERAPY (0.9 CM) - MARGINS UNINVOLVED BY CARCINOMA (0.2 CM; INFERIOR MARGIN) - CALCIFICATIONS ASSOCIATED WITH CARCINOMA - PREVIOUS BIOPSY SITE CHANGES PRESENT - SEE ONCOLOGY TABLE AND COMMENT BELOW 2. Lymph node, sentinel, biopsy, left axillary #1 - NO CARCINOMA IDENTIFIED IN ONE LYMPH NODE (0/1) - SEE COMMENT 3. Lymph node, sentinel, biopsy, left axillary #2 - NO CARCINOMA IDENTIFIED IN ONE LYMPH NODE (0/1) - SEE COMMENT 4. Lymph node, sentinel, biopsy, left - NO CARCINOMA IDENTIFIED IN ONE LYMPH NODE (0/1) - SEE COMMENT 5. Lymph node, sentinel, biopsy, left axillary #3 - NO CARCINOMA IDENTIFIED IN ONE LYMPH NODE (0/1) - SEE COMMENT 6. Breast, modified radical mastectomy , right - RESIDUAL INVASIVE DUCTAL CARCINOMA STATUS POST NEOADJUVANT THERAPY (8.5 CM) - EXTENSIVE LYMPHOVASCULAR SPACE INVASION PRESENT - METASTATIC CARCINOMA  INVOLVING FIVE OF FIFTEEN LYMPH NODES WITH EXTRACAPSULAR EXTENSION (5/15) - MARGINS UNINVOLVED BY CARCINOMA - SEE ONCOLOGY TABLE AND COMMENT BELOW   04/03/2018 Receptors her2   The tumor cells are NEGATIVE for Her2 (1+). Estrogen Receptor: 95%, POSITIVE, STRONG STAINING INTENSITY Progesterone Receptor: 95%, POSITIVE, STRONG STAINING INTENSITY  The tumor cells are NEGATIVE for Her2 (1+). Estrogen Receptor: 100%, POSITIVE, STRONG STAINING INTENSITY Progesterone Receptor: 0%, NEGATIVE   04/03/2018 Cancer Staging   Staging form: Breast, AJCC 8th Edition - Pathologic stage from 04/03/2018: No Stage Recommended (ypT1b, pN0, cM0, G1, ER+, PR+, HER2-) - Signed by [name], MD on 05/03/2018   04/03/2018 Surgery   Left LEFT BREAST LUMPECTOMY WITH RADIOACTIVE SEED AND LEFT SENTINEL LYMPH NODE BIOPSY with [name], MD    05/20/2018 - 07/08/2018 Radiation Therapy   Daily adjuvant radiation treatment with Dr.Moody.    Radiation treatment dates:   05/20/2018 - 07/08/2018   Site/dose:   The patient initially received a dose of 50.4 Gy in 28 fractions to the right chest wall and supraclavicular region. This was delivered using a 3-D conformal, 4 field technique. The patient then received a boost to the mastectomy scar. This delivered an additional 10 Gy in 5 fractions using an en face electron field. The total dose was 60.4 Gy. At the same time, the patient received a dose of 50.4 Gy in 28 fractions to the left breast using whole-breast tangent fields.  This was delivered using a 3-D conformal technique. The patient then received a boost to the seroma. This delivered an additional 10 Gy in 5 fractions using 15E electrons with a special teletherapy technique. The total dose was 60.4 Gy.   10/19/2018 -  Anti-estrogen oral therapy   Letrozole 2.'5mg'$  daily started 07/2018     Survivorship   Per Cira Rue, NP       INTERVAL HISTORY:  Darlene Beasley is here for a follow up of breast cancer. She  was last seen by me 6 months ago. She presents to the clinic alone.  She had episode of pneumonia in 04/2022 and flu in Jan 2024, she was quite sick, but has recovered well. No pain except mild arthritis  Eating well, she lost some weight during her respiratory infections, she is beginning weight back lately. Right shoulder rash got better, no new rashes.   All other systems were reviewed with the patient and are negative.  MEDICAL HISTORY:  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)   Personal history of chemotherapy    Personal history of radiation therapy     SURGICAL HISTORY: Past Surgical History:  Procedure Laterality Date   BREAST LUMPECTOMY Left 04/03/2018   BREAST LUMPECTOMY WITH RADIOACTIVE SEED AND SENTINEL LYMPH NODE BIOPSY Left 04/03/2018   Procedure: LEFT BREAST LUMPECTOMY WITH RADIOACTIVE SEED AND LEFT SENTINEL LYMPH NODE BIOPSY;  Surgeon: [name], MD;  Location: [facility];  Service: General;  Laterality: Left;   IR IMAGING GUIDED PORT INSERTION  10/20/2017   IR US GUIDE VASC ACCESS LEFT  10/20/2017   MASTECTOMY Right 04/03/2018   MASTECTOMY MODIFIED RADICAL Right 04/03/2018   Procedure: MASTECTOMY MODIFIED RADICAL;  Surgeon: [name], MD;  Location: [facility];  Service: General;  Laterality: Right;   PORT-A-CATH REMOVAL N/A 04/03/2018   Procedure: REMOVAL PORT-A-CATH;  Surgeon: [name], MD;  Location: [facility];  Service: General;  Laterality: N/A;   TONSILLECTOMY Bilateral    TUBAL LIGATION      I have reviewed the social history and family history with the patient and they are unchanged from  previous note.  ALLERGIES:  is allergic to bee venom.  MEDICATIONS:  Current Outpatient Medications  Medication Sig Dispense Refill   amLODipine (NORVASC) 5 MG tablet TAKE 1 TABLET(5 MG) BY MOUTH DAILY 90 tablet 1   b complex vitamins tablet Take 1 tablet by mouth daily.     benzonatate (TESSALON) 100 MG capsule Take 1 capsule (100 mg total) by mouth every 8 (eight) hours. 21 capsule 0   magnesium chloride (SLOW-MAG) 64 MG TBEC SR tablet Take by mouth.     magnesium citrate SOLN Take 1 Bottle by mouth once.     Nutritional Supplements (VITAMIN D BOOSTER PO) Take by mouth.     tamoxifen (NOLVADEX) 20 MG tablet Take 1 tablet (20 mg total) by mouth daily. 30 tablet 5   Ubiquinol (UBQH) 100 MG CAPS Take by mouth.     No current facility-administered medications for this visit.    PHYSICAL EXAMINATION: ECOG PERFORMANCE STATUS: 1 - Symptomatic but completely ambulatory  Vitals:  08/16/22 1456  BP: 131/84  Pulse: 82  Resp: 18  Temp: 97.8 F (36.6 C)  SpO2: 99%   Wt Readings from Last 3 Encounters:  08/16/22 153 lb 3.2 oz (69.5 kg)  02/13/22 147 lb 11.2 oz (67 kg)  08/16/21 151 lb 8 oz (68.7 kg)     GENERAL:alert, no distress and comfortable SKIN: skin color, texture, turgor are normal, no rashes or significant lesions EYES: normal, Conjunctiva are pink and non-injected, sclera clear NECK: supple, thyroid normal size, non-tender, without nodularity LYMPH:  no palpable lymphadenopathy in the cervical, axillary  LUNGS: clear to auscultation and percussion with normal breathing effort HEART: regular rate & rhythm and no murmurs and no lower extremity edema ABDOMEN:abdomen soft, non-tender and normal bowel sounds Musculoskeletal:no cyanosis of digits and no clubbing  NEURO: alert & oriented x 3 with fluent speech, no focal motor/sensory deficits Breasts: Status post right mastectomy, no palpable nodules in the right chest wall.  Palpation of the left breast and axilla revealed no  obvious mass that I could appreciate.   LABORATORY DATA:  I have reviewed the data as listed    Latest Ref Rng & Units 08/16/2022    2:37 PM 02/13/2022    1:05 PM 08/16/2021    7:47 AM  CBC  WBC 4.0 - 10.5 K/uL 6.3  11.2  6.5   Hemoglobin 12.0 - 15.0 g/dL 13.1  13.2  14.4   Hematocrit 36.0 - 46.0 % 39.0  38.2  43.3   Platelets 150 - 400 K/uL 203  178  241         Latest Ref Rng & Units 02/13/2022    1:05 PM 08/16/2021    7:47 AM 02/15/2021    8:05 AM  CMP  Glucose 70 - 99 mg/dL 99  105  108   BUN 8 - 23 mg/dL '19  22  21   '$ Creatinine 0.44 - 1.00 mg/dL 0.81  0.75  0.88   Sodium 135 - 145 mmol/L 138  142  142   Potassium 3.5 - 5.1 mmol/L 4.3  5.2  4.2   Chloride 98 - 111 mmol/L 106  107  107   CO2 22 - 32 mmol/L '28  31  24   '$ Calcium 8.9 - 10.3 mg/dL 9.4  9.5  9.3   Total Protein 6.5 - 8.1 g/dL 6.5  6.4  6.6   Total Bilirubin 0.3 - 1.2 mg/dL 0.5  0.6  0.6   Alkaline Phos 38 - 126 U/L 58  65  74   AST 15 - 41 U/L '17  15  17   '$ ALT 0 - 44 U/L '13  14  15       '$ RADIOGRAPHIC STUDIES: I have personally reviewed the radiological images as listed and agreed with the findings in the report. No results found.    No orders of the defined types were placed in this encounter.  All questions were answered. The patient knows to call the clinic with any problems, questions or concerns. No barriers to learning was detected. The total time spent in the appointment was 30 minutes.     [name], MD 08/16/2022

## 2022-08-16 NOTE — Assessment & Plan Note (Signed)
stage IIIA (cT3N1M0), ER+/PR-/HER2-, [id number]; left breast - invasive ductal carcinoma, grade 1, stage IA (cT1bN0M0), ER+/PR+/HER2-, ypT1bN0 -Diagnosed in 09/2017. 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  -She is s/p neoadjuvant chemo with AC-T, left lumpectomy and right mastectomy. She had a very limited response to neoadjuvant chemotherapy. She also completed adjuvant radiation.  -She started antiestrogen therapy with Letrozole in 10/2018. Plan for 7 years.  She tolerated well.  Due to her osteoporosis, I changed letrozole to tamoxifen in September 2023. -most recent left mammogram 03/2022 was benign.

## 2022-08-17 ENCOUNTER — Encounter: Payer: Self-pay | Admitting: Hematology

## 2022-08-19 ENCOUNTER — Telehealth: Payer: Self-pay

## 2022-08-19 NOTE — Telephone Encounter (Signed)
Per 3/8 LOS reached out to patient to schedule, voicemail hasn't been setup yet.

## 2022-09-17 ENCOUNTER — Encounter: Payer: Self-pay | Admitting: Hematology

## 2022-11-06 ENCOUNTER — Other Ambulatory Visit: Payer: Self-pay | Admitting: Hematology

## 2022-11-06 DIAGNOSIS — I10 Essential (primary) hypertension: Secondary | ICD-10-CM

## 2022-11-29 ENCOUNTER — Emergency Department (HOSPITAL_COMMUNITY)
Admission: EM | Admit: 2022-11-29 | Discharge: 2022-11-29 | Disposition: A | Discharge status: Home / Self Care | Payer: 59 | Attending: Emergency Medicine | Admitting: Emergency Medicine

## 2022-11-29 ENCOUNTER — Other Ambulatory Visit: Payer: Self-pay

## 2022-11-29 ENCOUNTER — Emergency Department (HOSPITAL_COMMUNITY): Payer: 59

## 2022-11-29 DIAGNOSIS — R519 Headache, unspecified: Secondary | ICD-10-CM | POA: Diagnosis not present

## 2022-11-29 DIAGNOSIS — M542 Cervicalgia: Secondary | ICD-10-CM | POA: Diagnosis not present

## 2022-11-29 DIAGNOSIS — Z79899 Other long term (current) drug therapy: Secondary | ICD-10-CM | POA: Diagnosis not present

## 2022-11-29 DIAGNOSIS — R110 Nausea: Secondary | ICD-10-CM | POA: Diagnosis not present

## 2022-11-29 LAB — CBC WITH DIFFERENTIAL/PLATELET
Abs Immature Granulocytes: 0.02 10*3/uL (ref 0.00–0.07)
Basophils Absolute: 0 10*3/uL (ref 0.0–0.1)
Basophils Relative: 1 %
Eosinophils Absolute: 0 10*3/uL (ref 0.0–0.5)
Eosinophils Relative: 0 %
HCT: 45.7 % (ref 36.0–46.0)
Hemoglobin: 15.6 g/dL — ABNORMAL HIGH (ref 12.0–15.0)
Immature Granulocytes: 0 %
Lymphocytes Relative: 22 %
Lymphs Abs: 1.5 10*3/uL (ref 0.7–4.0)
MCH: 31.3 pg (ref 26.0–34.0)
MCHC: 34.1 g/dL (ref 30.0–36.0)
MCV: 91.8 fL (ref 80.0–100.0)
Monocytes Absolute: 0.4 10*3/uL (ref 0.1–1.0)
Monocytes Relative: 5 %
Neutro Abs: 4.9 10*3/uL (ref 1.7–7.7)
Neutrophils Relative %: 72 %
Platelets: 243 10*3/uL (ref 150–400)
RBC: 4.98 MIL/uL (ref 3.87–5.11)
RDW: 13 % (ref 11.5–15.5)
WBC: 6.9 10*3/uL (ref 4.0–10.5)
nRBC: 0 % (ref 0.0–0.2)

## 2022-11-29 LAB — BASIC METABOLIC PANEL
Anion gap: 12 (ref 5–15)
BUN: 17 mg/dL (ref 8–23)
CO2: 25 mmol/L (ref 22–32)
Calcium: 9 mg/dL (ref 8.9–10.3)
Chloride: 97 mmol/L — ABNORMAL LOW (ref 98–111)
Creatinine, Ser: 0.72 mg/dL (ref 0.44–1.00)
GFR, Estimated: >60 mL/min (ref >60)
Glucose, Bld: 120 mg/dL — ABNORMAL HIGH (ref 70–99)
Potassium: 3.5 mmol/L (ref 3.5–5.1)
Sodium: 134 mmol/L — ABNORMAL LOW (ref 135–145)

## 2022-11-29 MED ORDER — ONDANSETRON HCL 4 MG PO TABS: 4.0000 mg | ORAL_TABLET | Freq: Four times a day (QID) | ORAL | 0 refills | Status: DC

## 2022-11-29 MED ORDER — SODIUM CHLORIDE 0.9 % IV BOLUS
500.0000 mL | Freq: Once | INTRAVENOUS | Status: AC
  Administered 2022-11-29: 500 mL via INTRAVENOUS

## 2022-11-29 MED ORDER — ONDANSETRON HCL 4 MG/2ML IJ SOLN
4.0000 mg | Freq: Once | INTRAMUSCULAR | Status: AC
  Administered 2022-11-29: 4 mg via INTRAVENOUS
  Filled 2022-11-29: qty 2

## 2022-11-29 NOTE — Discharge Instructions (Signed)
Return for any problem.  ?

## 2022-11-29 NOTE — ED Provider Notes (Signed)
[place] EMERGENCY DEPARTMENT AT [facility] Provider Note   CSN: [id number] Arrival date & time: 11/29/22  4098     History  Chief Complaint  Patient presents with   Headache   Nausea    Darlene Beasley is a 67 y.o. female.  67 year old female with prior medical history as detailed below presents for evaluation.  Patient primarily complains of posterior left-sided neck pain that began 3 days ago.  This pain gradually progressed caudally.  She complains now of posterior head and neck pain.  Patient's pain is worse with movement.  She denies visual change.  She denies focal weakness.  She reports intermittent nausea associated with the pain.  She declines pain medication currently.  She denies associated trauma.  She denies unsteady gait.  She denies difficulty with speech.  The history is provided by the patient and medical records.       Home Medications Prior to Admission medications   Medication Sig Start Date End Date Taking? Authorizing Provider  amLODipine (NORVASC) 5 MG tablet TAKE 1 TABLET(5 MG) BY MOUTH DAILY 11/06/22   Pollyann Samples, NP  b complex vitamins tablet Take 1 tablet by mouth daily.    [provider]  benzonatate (TESSALON) 100 MG capsule Take 1 capsule (100 mg total) by mouth every 8 (eight) hours. 01/26/21   Renne Crigler, PA-C  magnesium chloride (SLOW-MAG) 64 MG TBEC SR tablet Take by mouth.    [provider]  magnesium citrate SOLN Take 1 Bottle by mouth once.    [provider]  Nutritional Supplements (VITAMIN D BOOSTER PO) Take by mouth.    [provider]  tamoxifen (NOLVADEX) 20 MG tablet Take 1 tablet (20 mg total) by mouth daily. 08/16/22   Malachy Mood, MD  Ubiquinol (UBQH) 100 MG CAPS Take by mouth.    [provider]      Allergies    Bee venom    Review of Systems   Review of Systems  All other systems reviewed and are negative.   Physical Exam Updated Vital Signs BP (!)  156/113 (BP Location: Left Arm)   Pulse 95   Temp 98.3 F (36.8 C) (Oral)   Resp 16   Ht 5\' 4"  (1.626 m)   Wt 68 kg   SpO2 99%   BMI 25.75 kg/m  Physical Exam Vitals and nursing note reviewed.  Constitutional:      General: She is not in acute distress.    Appearance: Normal appearance. She is well-developed.  HENT:     Head: Normocephalic and atraumatic.  Eyes:     General: No visual field deficit.    Conjunctiva/sclera: Conjunctivae normal.     Pupils: Pupils are equal, round, and reactive to light.  Cardiovascular:     Rate and Rhythm: Normal rate and regular rhythm.     Heart sounds: Normal heart sounds.  Pulmonary:     Effort: Pulmonary effort is normal. No respiratory distress.     Breath sounds: Normal breath sounds.  Abdominal:     General: There is no distension.     Palpations: Abdomen is soft.     Tenderness: There is no abdominal tenderness.  Musculoskeletal:        General: No deformity. Normal range of motion.     Cervical back: Normal range of motion and neck supple.  Skin:    General: Skin is warm and dry.  Neurological:     General: No focal deficit  present.     Mental Status: She is alert and oriented to person, place, and time.     GCS: GCS eye subscore is 4. GCS verbal subscore is 5. GCS motor subscore is 6.     Cranial Nerves: No cranial nerve deficit, dysarthria or facial asymmetry.     Sensory: No sensory deficit.     Motor: No weakness.     Coordination: Romberg sign negative. Coordination normal.     ED Results / Procedures / Treatments   Labs (all labs ordered are listed, but only abnormal results are displayed) Labs Reviewed  CBC WITH DIFFERENTIAL/PLATELET - Abnormal; Notable for the following components:      Result Value   Hemoglobin 15.6 (*)    All other components within normal limits  BASIC METABOLIC PANEL    EKG None  Radiology No results found.  Procedures Procedures    Medications Ordered in ED Medications   ondansetron (ZOFRAN) injection 4 mg (has no administration in time range)    ED Course/ Medical Decision Making/ A&P                             Medical Decision Making Amount and/or Complexity of Data Reviewed Labs: ordered. Radiology: ordered.  Risk Prescription drug management.    Medical Screen Complete  This patient presented to the ED with complaint of headache, neck pain.  This complaint involves an extensive number of treatment options. The initial differential diagnosis includes, but is not limited to, tension headache, muscular strain, metabolic abnormality, etc.  This presentation is: Acute, Self-Limited, Previously Undiagnosed, and Uncertain Prognosis  Patient presents with complaint of posterior neck and head pain.  Describes symptoms are consistent with likely muscular tension headache.  Obtained imaging and screening labs are without significant acute abnormality.  Patient is reassured and feels improved after ED workup and evaluation.  She is comfortable with plan for discharge.  She understands need for close outpatient follow-up.  Strict return precautions given and understood.     Additional history obtained:  External records from outside sources obtained and reviewed including prior ED visits and prior Inpatient records.    Lab Tests:  I ordered and personally interpreted labs.   Imaging Studies ordered:  I ordered imaging studies including CT head I independently visualized and interpreted obtained imaging which showed NAD I agree with the radiologist interpretation.   Cardiac Monitoring:  The patient was maintained on a cardiac monitor.  I personally viewed and interpreted the cardiac monitor which showed an underlying rhythm of: NSR   Medicines ordered:  I ordered medication including IV fluids, Zofran for nausea Reevaluation of the patient after these medicines showed that the patient: improved   Problem List / ED  Course:  Headache   Reevaluation:  After the interventions noted above, I reevaluated the patient and found that they have: improved  Disposition:  After consideration of the diagnostic results and the patients response to treatment, I feel that the patent would benefit from close outpatient follow-up.          Final Clinical Impression(s) / ED Diagnoses Final diagnoses:  Acute nonintractable headache, unspecified headache type    Rx / DC Orders ED Discharge Orders     None         Wynetta Fines, MD 11/29/22 1550

## 2022-11-29 NOTE — ED Triage Notes (Signed)
Pt arrived via POV. C/o HA and nausea that began in neck and has moved to top of head for 3x days.    AOx4

## 2023-02-02 ENCOUNTER — Other Ambulatory Visit: Payer: Self-pay | Admitting: Hematology

## 2023-02-16 NOTE — Progress Notes (Unsigned)
Patient Care Team: Estevan Oaks, NP as PCP - General (Nurse Practitioner) Claud Kelp, MD as Consulting Physician (General Surgery) Malachy Mood, MD as Consulting Physician (Hematology) Dorothy Puffer, MD as Consulting Physician (Radiation Oncology) Pollyann Samples, NP as Nurse Practitioner (Nurse Practitioner)   CHIEF COMPLAINT: Follow up right breast cancer   Oncology History Overview Note  Cancer Staging Cancer of overlapping sites of right female breast [facility]) Staging form: Breast, AJCC 8th Edition - Clinical stage from 09/23/2017: Stage IIIA (cT3, cN1, cM0, G2, ER+, PR-, HER2-) - Signed by Malachy Mood, MD on 10/09/2017 - Pathologic stage from 04/03/2018: No Stage Recommended (ypT3, pN2a, cM0, G3, ER+, PR-, HER2-) - Signed by Malachy Mood, MD on 05/03/2018  Malignant neoplasm of left breast in female, estrogen receptor positive (HCC) Staging form: Breast, AJCC 8th Edition - Clinical: Stage IA (cT1b, cN0, cM0, G1, ER+, PR+, HER2-) - Unsigned - Pathologic stage from 04/03/2018: No Stage Recommended (ypT1b, pN0, cM0, G1, ER+, PR+, HER2-) - Signed by Malachy Mood, MD on 05/03/2018     Cancer of overlapping sites of right female breast (HCC)  09/09/2017 Mammogram   IMPRESSION: 1. Highly suspicious large right breast mass involving the outer and upper breast extending from approximately 9 o'clock through 12-1 o'clock, maximum measurement approximating 7.5 cm. The mass involves the right nipple, accounting for nipple retraction. Architectural distortion, microcalcifications and diffuse trabecular thickening and skin thickening are associated with the large mass, raising the possibility of this representing an inflammatory cancer. 2. Satellite masses separate from the dominant mass involving the lower outer quadrant and the upper inner quadrant of the right breast, measured above. 3. Two adjacent pathologic right axillary lymph nodes. 4. Indeterminate 0.9 cm solid mass involving the upper outer  quadrant of the left breast which accounts for a mammographic finding. 5. No pathologic left axillary lymphadenopathy.   09/09/2017 Breast US   Targeted right breast ultrasound is performed, showing a very large hypoechoic mass with irregular margins extending from the approximate 9 o'clock position through the 12 to 1 o'clock position. On the CC gait image, the mass measures maximally approximately 7.5 cm. The mass has irregular margins, demonstrates acoustic shadowing, and demonstrates internal power Doppler flow. The mass extends into the retroareolar region and involves the nipple, accounting for the nipple retraction.   Separate from the dominant mass at the 7 o'clock position approximately 4 cm from nipple is a hypoechoic antiparallel mass with irregular margins measuring approximately 1.4 x 2.7 x 1.7 cm.  Separate from the dominant mass at the 1 o'clock position approximately 2 cm from nipple is a hypoechoic mass with irregular margins measuring approximately 1.7 x 0.5 x 1.6 cm. Both of these masses also demonstrate internal power Doppler flow.   Sonographic evaluation of the right axilla demonstrates 2 adjacent pathologic lymph nodes, the larger measuring approximately 2.5 x 2.5 x 2.8 cm, the smaller measuring approximately 2.0 x 0.8 x 2.7 cm.   09/23/2017 Initial Biopsy   Diagnosis 1. Breast, left, needle core biopsy, 2 o'clock - INVASIVE DUCTAL CARCINOMA. - SEE COMMENT. 2. Breast, right, needle core biopsy, 11:30 o'clock - INVASIVE MAMMARY CARCINOMA. - SEE COMMENT. 3. Lymph node, needle/core biopsy, right axilla - METASTATIC MAMMARY CARCINOMA IN 1 OF 1 LYMPH NODE (1/1). Microscopic Comment 1. There is a small focus of grade I invasive ductal carcinoma. A complete breast prognostic profile will be attempted and the results reported separately. The results were called to the [facility] on 09/24/2017.  2. The carcinoma appears grade II. An E-Cadherin stain and a breast  prognostic profile will be performed on part 2 and the results reported separately. (JBK:kh 09-24-17) [name]  1. HER2 Negative by FISH Estrogen receptor: 100% positive, strong staining Progesterone receptor: 80% positive, strong staining Ki67: 5%  2. HER2 Negative by FISH Estrogen receptor: 100% positive, strong staining Progesterone receptor: 0%, negative  Ki67: 30% 2. The tumor cells are strongly positive for E-cadherin, supporting a ductal phenotype   09/23/2017 Cancer Staging   Staging form: Breast, AJCC 8th Edition - Clinical stage from 09/23/2017: Stage IIIA (cT3, cN1, cM0, G2, ER+, PR-, HER2-) - Signed by Malachy Mood, MD on 10/09/2017   10/07/2017 Breast MRI   IMPRESSION: 1. Biopsy proven malignancy involving all 4 quadrants of the right breast, although predominantly located in the anterior third of the central upper right breast measures 6.8 x 9.6 x 7.2 cm.   2. Five pathologic right axillary lymph nodes, compatible with biopsy proven axillary metastases.   3. Small mass with associated biopsy related changes in the upper-outer left breast at site of known malignancy measures 0.9 x 0.8 x 0.7 cm. No abnormal lymph nodes seen in the left axilla.   RECOMMENDATION: Treatment plan for known bilateral breast malignancy.   BI-RADS CATEGORY  6: Known biopsy-proven malignancy.   10/08/2017 Initial Diagnosis   Cancer of overlapping sites of right female breast (HCC)   10/13/2017 Pathology Results   Diagnosis 1. Breast, right, needle core biopsy, 1 o'clock - INVASIVE DUCTAL CARCINOMA. - SEE COMMENT. 2. Breast, right, needle core biopsy, 7 o'clock - INVASIVE DUCTAL CARCINOMA. - SEE COMMENT. Microscopic Comment 1. and 2. The carcinoma in parts 1 and 2 is morphologically similar and appears grade II. Because breast prognostic profiles were performed on the patients previous case, [id number], it will not be repeated on the current case unless requested.    10/17/2017 Imaging   CT  CAP IMPRESSION: 1. Infiltrative right breast mass with overlying skin thickening is identified compatible with known breast cancer. 2. Enlarged right axillary lymph nodes compatible with metastatic adenopathy. 3. No additional sites of disease identified within the chest and no evidence for metastasis to the abdomen or pelvis. 4. Aortic atherosclerosis and 3 vessel coronary artery atherosclerotic calcifications. Aortic Atherosclerosis (ICD10-I70.0).   10/17/2017 Imaging   Bone Scan FINDINGS: There are no foci of increased or decreased radiotracer uptake to suggest osseous metastatic disease. 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 There is degenerative type uptake within both shoulders and the left ankle.   Normal physiologic activity is identified within the kidneys and urinary bladder.   IMPRESSION: No evidence of osseous metastatic disease.   10/22/2017 -  Chemotherapy   AC q2 weeks x4 cycles starting 10/22/17-12/03/17, followed by weekly taxol x12 12/17/17-03/11/18   11/14/2017 Genetic Testing   The genes that were analyzed were the 83 genes on [facility]'s Multi-Cancer panel (ALK, APC, ATM, AXIN2, BAP1, BARD1, BLM, BMPR1A, BRCA1, BRCA2, BRIP1, CASR, CDC73, CDH1, CDK4, CDKN1B, CDKN1C, CDKN2A, CEBPA, CHEK2, CTNNA1, DICER1, DIS3L2, EGFR, EPCAM, FH, FLCN, GATA2, GPC3, GREM1, HOXB13, HRAS, KIT, MAX, MEN1, MET, MITF, MLH1, MSH2, MSH3, MSH6, MUTYH, NBN, NF1, NF2, NTHL1, PALB2, PDGFRA, PHOX2B, PMS2, POLD1, POLE, POT1, PRKAR1A, PTCH1, PTEN, RAD50, RAD51C, RAD51D, RB1, RECQL4, RET, RUNX1, SDHA, SDHAF2, SDHB, SDHC, SDHD, SMAD4, SMARCA4, SMARCB1, SMARCE1, STK11, SUFU, TERC, TERT, TMEM127, TP53, TSC1, TSC2, VHL, WRN, WT1).  Testing revealed a  pathogenic mutation in the MITF gene called c.952G>A (p.Glu318Lys) and A Variant of Unknown Significance (VUS) was also detected: POLD1 c.301A>T (p.Ile101Phe).   12/16/2017  Breast US   IMPRESSION: 1. Stable to minimal decrease in size of multiple irregular masses throughout the right breast consistent with the patient's sites of biopsy proven malignancy. While the mammographic appearance is slightly less dense in this region, there is increased skin and trabecular thickening involving the entire right breast. 2. Stable appearance of morphologically abnormal right axillary lymph nodes, consistent with biopsy proven metastatic disease. 3. Stable appearance of a left breast mass, consistent with biopsy proven malignancy.     04/03/2018 Cancer Staging   Staging form: Breast, AJCC 8th Edition - Pathologic stage from 04/03/2018: No Stage Recommended (ypT3, pN2a, cM0, G3, ER+, PR-, HER2-) - Signed by Malachy Mood, MD on 05/03/2018   10/19/2018 -  Anti-estrogen oral therapy   Adjuvant letrozole 2.5mg  daily     Survivorship   Per Santiago Glad, NP    Malignant neoplasm of left breast in female, estrogen receptor positive (HCC)  09/09/2017 Breast US   Targeted left breast ultrasound is performed, showing an oval parallel hypoechoic mass containing microcalcifications at the 2 o'clock position approximately 6 cm from nipple measuring approximately 0.9 x 0.5 x 0.9 cm, corresponding to the mammographic finding.   Sonographic evaluation of the left axilla demonstrates no pathologic lymphadenopathy.   09/23/2017 Initial Biopsy   Diagnosis 1. Breast, left, needle core biopsy, 2 o'clock - INVASIVE DUCTAL CARCINOMA. - SEE COMMENT. 2. Breast, right, needle core biopsy, 11:30 o'clock - INVASIVE MAMMARY CARCINOMA. - SEE COMMENT. 3. Lymph node, needle/core biopsy, right axilla - METASTATIC MAMMARY CARCINOMA IN 1 OF 1 LYMPH NODE (1/1). Microscopic Comment 1. There is a small focus of grade I invasive ductal carcinoma. A complete breast prognostic profile will be attempted and the results reported separately. The results were called to the [facility] on  09/24/2017. 2. The carcinoma appears grade II. An E-Cadherin stain and a breast prognostic profile will be performed on part 2 and the results reported separately. (JBK:kh 09-24-17) [name]  1. HER2 Negative by FISH Estrogen receptor: 100% positive, strong staining Progesterone receptor: 80% positive, strong staining Ki67: 5%  2. HER2 Negative by FISH Estrogen receptor: 100% positive, strong staining Progesterone receptor: 0%, negative  Ki67: 30%   10/08/2017 Initial Diagnosis   Malignant neoplasm of left breast in female, estrogen receptor positive (HCC)   12/16/2017 Breast US   IMPRESSION: 3. Stable appearance of a left breast mass, consistent with biopsy proven malignancy.     01/22/2018 Imaging   01/22/2018 MRI Bilateral Breast IMPRESSION: 1. Dominant enhancing mass with surrounding nodularity involving the central anterior third of the right breast now measures up to 6.4 x 6.7 x 7.6 cm (previously measured 6.8 x 9.6 x 7.2 cm). Persistent bulky right axillary lymphadenopathy, slightly decreased in size since prior MRI.   2. Biopsy proven left breast malignancy now measures 0.5 cm (previously measured 0.9 x 0.8 x 0.7 cm).    04/03/2018 Pathology Results   Diagnosis 1. Breast, lumpectomy, left with radioactive seed - RESIDUAL INVASIVE DUCTAL CARCINOMA STATUS POST NEOADJUVANT THERAPY (0.9 CM) - MARGINS UNINVOLVED BY CARCINOMA (0.2 CM; INFERIOR MARGIN) - CALCIFICATIONS ASSOCIATED WITH CARCINOMA - PREVIOUS BIOPSY SITE CHANGES PRESENT - SEE ONCOLOGY TABLE AND COMMENT BELOW 2. Lymph node, sentinel, biopsy, left axillary #1 - NO CARCINOMA IDENTIFIED IN ONE LYMPH NODE (0/1) - SEE COMMENT 3. Lymph node, sentinel, biopsy,  left axillary #2 - NO CARCINOMA IDENTIFIED IN ONE LYMPH NODE (0/1) - SEE COMMENT 4. Lymph node, sentinel, biopsy, left - NO CARCINOMA IDENTIFIED IN ONE LYMPH NODE (0/1) - SEE COMMENT 5. Lymph node, sentinel, biopsy, left axillary #3 - NO CARCINOMA  IDENTIFIED IN ONE LYMPH NODE (0/1) - SEE COMMENT 6. Breast, modified radical mastectomy , right - RESIDUAL INVASIVE DUCTAL CARCINOMA STATUS POST NEOADJUVANT THERAPY (8.5 CM) - EXTENSIVE LYMPHOVASCULAR SPACE INVASION PRESENT - METASTATIC CARCINOMA INVOLVING FIVE OF FIFTEEN LYMPH NODES WITH EXTRACAPSULAR EXTENSION (5/15) - MARGINS UNINVOLVED BY CARCINOMA - SEE ONCOLOGY TABLE AND COMMENT BELOW   04/03/2018 Receptors her2   The tumor cells are NEGATIVE for Her2 (1+). Estrogen Receptor: 95%, POSITIVE, STRONG STAINING INTENSITY Progesterone Receptor: 95%, POSITIVE, STRONG STAINING INTENSITY  The tumor cells are NEGATIVE for Her2 (1+). Estrogen Receptor: 100%, POSITIVE, STRONG STAINING INTENSITY Progesterone Receptor: 0%, NEGATIVE   04/03/2018 Cancer Staging   Staging form: Breast, AJCC 8th Edition - Pathologic stage from 04/03/2018: No Stage Recommended (ypT1b, pN0, cM0, G1, ER+, PR+, HER2-) - Signed by Malachy Mood, MD on 05/03/2018   04/03/2018 Surgery   Left LEFT BREAST LUMPECTOMY WITH RADIOACTIVE SEED AND LEFT SENTINEL LYMPH NODE BIOPSY with Claud Kelp, MD    05/20/2018 - 07/08/2018 Radiation Therapy   Daily adjuvant radiation treatment with Dr.Moody.    Radiation treatment dates:   05/20/2018 - 07/08/2018   Site/dose:   The patient initially received a dose of 50.4 Gy in 28 fractions to the right chest wall and supraclavicular region. This was delivered using a 3-D conformal, 4 field technique. The patient then received a boost to the mastectomy scar. This delivered an additional 10 Gy in 5 fractions using an en face electron field. The total dose was 60.4 Gy. At the same time, the patient received a dose of 50.4 Gy in 28 fractions to the left breast using whole-breast tangent fields. This was delivered using a 3-D conformal technique. The patient then received a boost to the seroma. This delivered an additional 10 Gy in 5 fractions using 15E electrons with a special teletherapy  technique. The total dose was 60.4 Gy.   10/19/2018 -  Anti-estrogen oral therapy   Letrozole 2.5mg  daily started 07/2018     Survivorship   Per Santiago Glad, NP       CURRENT THERAPY:  Tamoxifen, started in September 2023. She was previously on letrozole from May 2022 to September 2023.   INTERVAL HISTORY Darlene Beasley returns for follow up as scheduled. Last seen by Dr. Mosetta Putt 08/16/22. She continues tamoxifen.   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)   Personal history of chemotherapy    Personal history of radiation therapy      Past Surgical History:  Procedure Laterality Date   BREAST LUMPECTOMY Left 04/03/2018   BREAST LUMPECTOMY WITH RADIOACTIVE SEED AND SENTINEL LYMPH NODE BIOPSY Left 04/03/2018   Procedure: LEFT BREAST LUMPECTOMY WITH RADIOACTIVE SEED AND LEFT SENTINEL LYMPH NODE BIOPSY;  Surgeon: Claud Kelp, MD;  Location: [facility];  Service: General;  Laterality: Left;   IR IMAGING GUIDED PORT INSERTION  10/20/2017   IR US GUIDE  VASC ACCESS LEFT  10/20/2017   MASTECTOMY Right 04/03/2018   MASTECTOMY MODIFIED RADICAL Right 04/03/2018   Procedure: MASTECTOMY MODIFIED RADICAL;  Surgeon: Claud Kelp, MD;  Location: [facility];  Service: General;  Laterality: Right;   PORT-A-CATH REMOVAL N/A 04/03/2018   Procedure: REMOVAL PORT-A-CATH;  Surgeon: Claud Kelp, MD;  Location: [facility];  Service: General;  Laterality: N/A;   TONSILLECTOMY Bilateral    TUBAL LIGATION       Outpatient Encounter Medications as of 02/19/2023  Medication Sig   amLODipine (NORVASC) 5 MG tablet TAKE 1 TABLET(5 MG) BY MOUTH DAILY   b  complex vitamins tablet Take 1 tablet by mouth daily.   benzonatate (TESSALON) 100 MG capsule Take 1 capsule (100 mg total) by mouth every 8 (eight) hours.   magnesium chloride (SLOW-MAG) 64 MG TBEC SR tablet Take by mouth.   magnesium citrate SOLN Take 1 Bottle by mouth once.   Nutritional Supplements (VITAMIN D BOOSTER PO) Take by mouth.   ondansetron (ZOFRAN) 4 MG tablet Take 1 tablet (4 mg total) by mouth every 6 (six) hours.   tamoxifen (NOLVADEX) 20 MG tablet TAKE 1 TABLET(20 MG) BY MOUTH DAILY   Ubiquinol (UBQH) 100 MG CAPS Take by mouth.   No facility-administered encounter medications on file as of 02/19/2023.     There were no vitals filed for this visit. There is no height or weight on file to calculate BMI.   PHYSICAL EXAM GENERAL:alert, no distress and comfortable SKIN: no rash  EYES: sclera clear NECK: without mass LYMPH:  no palpable cervical or supraclavicular lymphadenopathy  LUNGS: clear with normal breathing effort HEART: regular rate & rhythm, no lower extremity edema ABDOMEN: abdomen soft, non-tender and normal bowel sounds NEURO: alert & oriented x 3 with fluent speech, no focal motor/sensory deficits Breast exam:  PAC without erythema    CBC    Component Value Date/Time   WBC 6.9 11/29/2022 0830   RBC 4.98 11/29/2022 0830   HGB 15.6 (H) 11/29/2022 0830   HGB 13.1 08/16/2022 1437   HGB 14.6 09/10/2017 1243   HCT 45.7 11/29/2022 0830   HCT 42.3 09/10/2017 1243   PLT 243 11/29/2022 0830   PLT 203 08/16/2022 1437   PLT 225 09/10/2017 1243   MCV 91.8 11/29/2022 0830   MCV 88 09/10/2017 1243   MCH 31.3 11/29/2022 0830   MCHC 34.1 11/29/2022 0830   RDW 13.0 11/29/2022 0830   RDW 14.1 09/10/2017 1243   LYMPHSABS 1.5 11/29/2022 0830   LYMPHSABS 2.9 09/10/2017 1243   MONOABS 0.4 11/29/2022 0830   EOSABS 0.0 11/29/2022 0830   EOSABS 0.2 09/10/2017 1243   BASOSABS 0.0 11/29/2022 0830   BASOSABS 0.0 09/10/2017 1243     CMP     Component Value  Date/Time   NA 134 (L) 11/29/2022 0830   NA 143 09/10/2017 1243   K 3.5 11/29/2022 0830   CL 97 (L) 11/29/2022 0830   CO2 25 11/29/2022 0830   GLUCOSE 120 (H) 11/29/2022 0830   BUN 17 11/29/2022 0830   BUN 25 09/10/2017 1243   CREATININE 0.72 11/29/2022 0830   CREATININE 0.80 08/16/2022 1437   CALCIUM 9.0 11/29/2022 0830   PROT 6.4 (L) 08/16/2022 1437   PROT 6.5 09/10/2017 1243   ALBUMIN 4.1 08/16/2022 1437   ALBUMIN 4.8 09/10/2017 1243   AST 15 08/16/2022 1437   ALT 13 08/16/2022 1437   ALKPHOS 42 08/16/2022 1437   BILITOT 0.4 08/16/2022 1437  GFRNONAA >60 11/29/2022 0830   GFRNONAA >60 08/16/2022 1437   GFRAA >60 01/20/2020 1256     ASSESSMENT & PLAN:  PLAN:  No orders of the defined types were placed in this encounter.     All questions were answered. The patient knows to call the clinic with any problems, questions or concerns. No barriers to learning were detected. I spent *** counseling the patient face to face. The total time spent in the appointment was *** and more than 50% was on counseling, review of test results, and coordination of care.   Santiago Glad, NP-C @DATE @

## 2023-02-19 ENCOUNTER — Inpatient Hospital Stay: Payer: 59 | Attending: Hematology

## 2023-02-19 ENCOUNTER — Encounter: Payer: Self-pay | Admitting: Nurse Practitioner

## 2023-02-19 ENCOUNTER — Inpatient Hospital Stay (HOSPITAL_BASED_OUTPATIENT_CLINIC_OR_DEPARTMENT_OTHER): Payer: 59 | Admitting: Nurse Practitioner

## 2023-02-19 VITALS — BP 155/93 | HR 81 | Temp 97.6°F | Resp 16 | Wt 145.2 lb

## 2023-02-19 DIAGNOSIS — C50412 Malignant neoplasm of upper-outer quadrant of left female breast: Secondary | ICD-10-CM | POA: Insufficient documentation

## 2023-02-19 DIAGNOSIS — Z1231 Encounter for screening mammogram for malignant neoplasm of breast: Secondary | ICD-10-CM | POA: Diagnosis not present

## 2023-02-19 DIAGNOSIS — C50811 Malignant neoplasm of overlapping sites of right female breast: Secondary | ICD-10-CM

## 2023-02-19 DIAGNOSIS — C773 Secondary and unspecified malignant neoplasm of axilla and upper limb lymph nodes: Secondary | ICD-10-CM | POA: Insufficient documentation

## 2023-02-19 DIAGNOSIS — Z170 Estrogen receptor positive status [ER+]: Secondary | ICD-10-CM | POA: Diagnosis not present

## 2023-02-19 DIAGNOSIS — Z87891 Personal history of nicotine dependence: Secondary | ICD-10-CM | POA: Insufficient documentation

## 2023-02-19 DIAGNOSIS — Z79810 Long term (current) use of selective estrogen receptor modulators (SERMs): Secondary | ICD-10-CM | POA: Diagnosis not present

## 2023-02-19 DIAGNOSIS — I10 Essential (primary) hypertension: Secondary | ICD-10-CM

## 2023-02-19 LAB — CBC WITH DIFFERENTIAL (CANCER CENTER ONLY)
Abs Immature Granulocytes: 0.02 10*3/uL (ref 0.00–0.07)
Basophils Absolute: 0 10*3/uL (ref 0.0–0.1)
Basophils Relative: 1 %
Eosinophils Absolute: 0.2 10*3/uL (ref 0.0–0.5)
Eosinophils Relative: 3 %
HCT: 38.7 % (ref 36.0–46.0)
Hemoglobin: 13.6 g/dL (ref 12.0–15.0)
Immature Granulocytes: 0 %
Lymphocytes Relative: 38 %
Lymphs Abs: 2 10*3/uL (ref 0.7–4.0)
MCH: 32.4 pg (ref 26.0–34.0)
MCHC: 35.1 g/dL (ref 30.0–36.0)
MCV: 92.1 fL (ref 80.0–100.0)
Monocytes Absolute: 0.3 10*3/uL (ref 0.1–1.0)
Monocytes Relative: 6 %
Neutro Abs: 2.7 10*3/uL (ref 1.7–7.7)
Neutrophils Relative %: 52 %
Platelet Count: 205 10*3/uL (ref 150–400)
RBC: 4.2 MIL/uL (ref 3.87–5.11)
RDW: 13.6 % (ref 11.5–15.5)
WBC Count: 5.1 10*3/uL (ref 4.0–10.5)
nRBC: 0 % (ref 0.0–0.2)

## 2023-02-19 LAB — CMP (CANCER CENTER ONLY)
ALT: 9 U/L (ref 0–44)
AST: 13 U/L — ABNORMAL LOW (ref 15–41)
Albumin: 4 g/dL (ref 3.5–5.0)
Alkaline Phosphatase: 64 U/L (ref 38–126)
Anion gap: 6 (ref 5–15)
BUN: 21 mg/dL (ref 8–23)
CO2: 28 mmol/L (ref 22–32)
Calcium: 9.5 mg/dL (ref 8.9–10.3)
Chloride: 108 mmol/L (ref 98–111)
Creatinine: 0.9 mg/dL (ref 0.44–1.00)
GFR, Estimated: >60 mL/min (ref >60)
Glucose, Bld: 98 mg/dL (ref 70–99)
Potassium: 4.3 mmol/L (ref 3.5–5.1)
Sodium: 142 mmol/L (ref 135–145)
Total Bilirubin: 0.4 mg/dL (ref 0.3–1.2)
Total Protein: 6.6 g/dL (ref 6.5–8.1)

## 2023-02-19 MED ORDER — TAMOXIFEN CITRATE 20 MG PO TABS: 20.0000 mg | ORAL_TABLET | Freq: Every day | ORAL | 3 refills | Status: DC

## 2023-02-19 MED ORDER — AMLODIPINE BESYLATE 5 MG PO TABS
5.0000 mg | ORAL_TABLET | Freq: Every day | ORAL | 1 refills | Status: DC
Start: 2023-02-19 — End: 2023-10-02

## 2023-03-03 ENCOUNTER — Other Ambulatory Visit: Payer: Self-pay

## 2023-03-03 NOTE — Progress Notes (Signed)
Pt Called in complaining of left leg pain.She stated that it has been going for a few months. She denies having swelling in the left leg and its not hot to touch. She reports the pain  is constant and as the day progress the pain gradually increase. She rated the at a 7 when walking and 5 when sitting. She also had mention that she lost 4 lbs since her last visit in 02/19/2023. Instructed her to f/u with her PCP  per RN Olegario Shearer. Pt verbalized understanding.   Darlene Beasley M,CMA

## 2023-03-24 ENCOUNTER — Ambulatory Visit
Admission: RE | Admit: 2023-03-24 | Discharge: 2023-03-24 | Disposition: A | Discharge status: Home / Self Care | Payer: 59 | Source: Ambulatory Visit | Attending: Nurse Practitioner | Admitting: Nurse Practitioner

## 2023-03-24 DIAGNOSIS — Z1231 Encounter for screening mammogram for malignant neoplasm of breast: Secondary | ICD-10-CM

## 2023-04-07 ENCOUNTER — Ambulatory Visit
Admission: RE | Admit: 2023-04-07 | Discharge: 2023-04-07 | Disposition: A | Discharge status: Home / Self Care | Payer: 59 | Source: Ambulatory Visit | Attending: Nurse Practitioner | Admitting: Nurse Practitioner

## 2023-04-07 ENCOUNTER — Other Ambulatory Visit: Payer: Self-pay | Admitting: Nurse Practitioner

## 2023-04-07 ENCOUNTER — Encounter: Payer: Self-pay | Admitting: Hematology

## 2023-04-07 DIAGNOSIS — G8929 Other chronic pain: Secondary | ICD-10-CM

## 2023-04-07 DIAGNOSIS — M25551 Pain in right hip: Secondary | ICD-10-CM

## 2023-05-01 ENCOUNTER — Encounter: Payer: Self-pay | Admitting: Hematology

## 2023-05-01 ENCOUNTER — Telehealth: Payer: Self-pay | Admitting: *Deleted

## 2023-05-01 NOTE — Progress Notes (Unsigned)
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  -She is s/p neoadjuvant chemo with AC-T, underwent left breast lumpectomy and right mastectomy. She had a very limited response to neoadjuvant chemotherapy. She also completed adjuvant radiation.  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  -She is s/p neoadjuvant chemo with AC-T, underwent left breast lumpectomy and right mastectomy. She had a very  limited response to neoadjuvant chemotherapy. She also completed adjuvant radiation.  -She started antiestrogen therapy with Letrozole in 10/2018, changed to Tamoxifen 02/2022 due to osteoporosis  -Continue surveillance and Tamoxifen (total 7 years of anti-estrogen), refilled -L mammo due 03/2023 -F/up in 6 months, or sooner if needed   Plan:  Reviewed recent x-rays of the lumbar spine and hips.  She has grade 1 anterolisthesis of L3 on L4.  She has mild degenerative changes of both hips.  No acute findings were noted. Recommend she take previously prescribed pain medications as needed and as prescribed. Recommend she hold treatment with tamoxifen as joint pain is a possible side effect. 2 small cystic lesions on left shoulder blade appear to be subcutaneous sebaceous cysts.  Recommend watching and possible referral to dermatology if needed. Check CBC, CMP, and CA 27.29 for further evaluation. Phone visit in 2 weeks to assess for improvement.  If pain is persistent, especially if CA 27.29 elevated, will get a bone scan for further evaluation  The patient understands the plans discussed today and is in agreement with them.  She knows to contact our office if she develops concerns prior to her next appointment.  I provided 25 minutes of face-to-face time during this encounter and > 50% was spent counseling as documented under my assessment and plan.    [name], NP  [facility] - A DEPT OF [facility] [address] [place] [address] Dept: [phone] Dept Fax: [phone]   No orders of the defined types were placed in this encounter.     CHIEF COMPLAINT:  CC: f/u bilateral breast cancer   Current Treatment:  Tamoxifen,  started in September 2023. She was previously on letrozole from May 2022 to September 2023   INTERVAL HISTORY:  Darlene Beasley is here today for repeat clinical assessment. She was last seen by  Clayborn Heron, NP on 02/19/2023. She is concerned about joint/bone pain. Remembers being told she may need bone scan to "see if cancer has gone to my bones."  She did recently see her primary care provider.  X-rays of her lumbar spine and hips were ordered.  X-rays show grade 1 anterolisthesis thesis of L3 and L4.  There is mild degenerative changes in both hips.  No acute findings were noted.  She was given a short-term prescription for Vicodin.  Has taken a few with very little response.  She also has 2 new cyetic lesions on her upper back and shoulder.  She does not see a dermatologist.  Her appetite is good. Her weight has increased 3 pounds over last 3 months .  I have reviewed the past medical history, past surgical history, social history and family history with the patient and they are unchanged from previous note.  ALLERGIES:  is allergic to bee venom and tramadol.  MEDICATIONS:  Current Outpatient Medications  Medication Sig Dispense Refill   amLODipine (NORVASC) 5 MG tablet Take 1 tablet (5 mg total) by mouth daily. 90 tablet 1   b complex vitamins tablet Take 1 tablet by mouth daily.     HYDROcodone-acetaminophen (NORCO/VICODIN) 5-325 MG tablet Take 1 tablet by mouth 3 (three) times daily as needed.     magnesium gluconate (MAGONATE) 500 MG tablet Take 500 mg by mouth daily.     ondansetron (ZOFRAN) 4 MG tablet Take 1 tablet (4 mg total) by mouth every 6 (six) hours. 12 tablet 0   tamoxifen (NOLVADEX) 20 MG tablet Take 1 tablet (20 mg total) by mouth daily. 90 tablet 3   benzonatate (TESSALON) 100 MG capsule Take 1 capsule (100 mg total) by mouth every 8 (eight) hours. (Patient not taking: Reported on 02/19/2023) 21 capsule 0   magnesium chloride (SLOW-MAG) 64 MG TBEC SR tablet Take by mouth. (Patient not taking: Reported on 02/19/2023)     magnesium citrate SOLN Take 1 Bottle by mouth once. (Patient not taking: Reported on 02/19/2023)     Nutritional Supplements (VITAMIN D BOOSTER PO) Take by  mouth. (Patient not taking: Reported on 02/19/2023)     Ubiquinol (UBQH) 100 MG CAPS Take by mouth. (Patient not taking: Reported on 02/19/2023)     No current facility-administered medications for this visit.    HISTORY OF PRESENT ILLNESS:   Oncology History Overview Note  Cancer Staging Cancer of overlapping sites of right female breast [facility]) Staging form: Breast, AJCC 8th Edition - Clinical stage from 09/23/2017: Stage IIIA (cT3, cN1, cM0, G2, ER+, PR-, HER2-) - Signed by Malachy Mood, MD on 10/09/2017 - Pathologic stage from 04/03/2018: No Stage Recommended (ypT3, pN2a, cM0, G3, ER+, PR-, HER2-) - Signed by Malachy Mood, MD on 05/03/2018  Malignant neoplasm of left breast in female, estrogen receptor positive (HCC) Staging form: Breast, AJCC 8th Edition - Clinical: Stage IA (cT1b, cN0, cM0, G1, ER+, PR+, HER2-) - Unsigned - Pathologic stage from 04/03/2018: No Stage Recommended (ypT1b, pN0, cM0, G1, ER+, PR+, HER2-) - Signed by Malachy Mood, MD on 05/03/2018     Cancer of overlapping sites of right female breast (HCC)  09/09/2017 Mammogram   IMPRESSION: 1. Highly suspicious large right breast mass involving the outer and upper breast extending from approximately 9 o'clock  through 12-1 o'clock, maximum measurement approximating 7.5 cm. The mass involves the right nipple, accounting for nipple retraction. Architectural distortion, microcalcifications and diffuse trabecular thickening and skin thickening are associated with the large mass, raising the possibility of this representing an inflammatory cancer. 2. Satellite masses separate from the dominant mass involving the lower outer quadrant and the upper inner quadrant of the right breast, measured above. 3. Two adjacent pathologic right axillary lymph nodes. 4. Indeterminate 0.9 cm solid mass involving the upper outer quadrant of the left breast which accounts for a mammographic finding. 5. No pathologic left axillary lymphadenopathy.   09/09/2017  Breast US   Targeted right breast ultrasound is performed, showing a very large hypoechoic mass with irregular margins extending from the approximate 9 o'clock position through the 12 to 1 o'clock position. On the CC gait image, the mass measures maximally approximately 7.5 cm. The mass has irregular margins, demonstrates acoustic shadowing, and demonstrates internal power Doppler flow. The mass extends into the retroareolar region and involves the nipple, accounting for the nipple retraction.   Separate from the dominant mass at the 7 o'clock position approximately 4 cm from nipple is a hypoechoic antiparallel mass with irregular margins measuring approximately 1.4 x 2.7 x 1.7 cm.  Separate from the dominant mass at the 1 o'clock position approximately 2 cm from nipple is a hypoechoic mass with irregular margins measuring approximately 1.7 x 0.5 x 1.6 cm. Both of these masses also demonstrate internal power Doppler flow.   Sonographic evaluation of the right axilla demonstrates 2 adjacent pathologic lymph nodes, the larger measuring approximately 2.5 x 2.5 x 2.8 cm, the smaller measuring approximately 2.0 x 0.8 x 2.7 cm.   09/23/2017 Initial Biopsy   Diagnosis 1. Breast, left, needle core biopsy, 2 o'clock - INVASIVE DUCTAL CARCINOMA. - SEE COMMENT. 2. Breast, right, needle core biopsy, 11:30 o'clock - INVASIVE MAMMARY CARCINOMA. - SEE COMMENT. 3. Lymph node, needle/core biopsy, right axilla - METASTATIC MAMMARY CARCINOMA IN 1 OF 1 LYMPH NODE (1/1). Microscopic Comment 1. There is a small focus of grade I invasive ductal carcinoma. A complete breast prognostic profile will be attempted and the results reported separately. The results were called to the [facility] on 09/24/2017. 2. The carcinoma appears grade II. An E-Cadherin stain and a breast prognostic profile will be performed on part 2 and the results reported separately. (JBK:kh 09-24-17) [name]  1. HER2 Negative by  FISH Estrogen receptor: 100% positive, strong staining Progesterone receptor: 80% positive, strong staining Ki67: 5%  2. HER2 Negative by FISH Estrogen receptor: 100% positive, strong staining Progesterone receptor: 0%, negative  Ki67: 30% 2. The tumor cells are strongly positive for E-cadherin, supporting a ductal phenotype   09/23/2017 Cancer Staging   Staging form: Breast, AJCC 8th Edition - Clinical stage from 09/23/2017: Stage IIIA (cT3, cN1, cM0, G2, ER+, PR-, HER2-) - Signed by Malachy Mood, MD on 10/09/2017   10/07/2017 Breast MRI   IMPRESSION: 1. Biopsy proven malignancy involving all 4 quadrants of the right breast, although predominantly located in the anterior third of the central upper right breast measures 6.8 x 9.6 x 7.2 cm.   2. Five pathologic right axillary lymph nodes, compatible with biopsy proven axillary metastases.   3. Small mass with associated biopsy related changes in the upper-outer left breast at site of known malignancy measures 0.9 x 0.8 x 0.7 cm. No abnormal lymph nodes seen in the left axilla.   RECOMMENDATION: Treatment plan for known bilateral  breast malignancy.   BI-RADS CATEGORY  6: Known biopsy-proven malignancy.   10/08/2017 Initial Diagnosis   Cancer of overlapping sites of right female breast (HCC)   10/13/2017 Pathology Results   Diagnosis 1. Breast, right, needle core biopsy, 1 o'clock - INVASIVE DUCTAL CARCINOMA. - SEE COMMENT. 2. Breast, right, needle core biopsy, 7 o'clock - INVASIVE DUCTAL CARCINOMA. - SEE COMMENT. Microscopic Comment 1. and 2. The carcinoma in parts 1 and 2 is morphologically similar and appears grade II. Because breast prognostic profiles were performed on the patients previous case, [id number], it will not be repeated on the current case unless requested.    10/17/2017 Imaging   CT CAP IMPRESSION: 1. Infiltrative right breast mass with overlying skin thickening is identified compatible with known breast cancer. 2.  Enlarged right axillary lymph nodes compatible with metastatic adenopathy. 3. No additional sites of disease identified within the chest and no evidence for metastasis to the abdomen or pelvis. 4. Aortic atherosclerosis and 3 vessel coronary artery atherosclerotic calcifications. Aortic Atherosclerosis (ICD10-I70.0).   10/17/2017 Imaging   Bone Scan FINDINGS: There are no foci of increased or decreased radiotracer uptake to suggest osseous metastatic disease. 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 There is degenerative type uptake within both shoulders and the left ankle.   Normal physiologic activity is identified within the kidneys and urinary bladder.   IMPRESSION: No evidence of osseous metastatic disease.   10/22/2017 -  Chemotherapy   AC q2 weeks x4 cycles starting 10/22/17-12/03/17, followed by weekly taxol x12 12/17/17-03/11/18   11/14/2017 Genetic Testing   The genes that were analyzed were the 83 genes on [facility]'s Multi-Cancer panel (ALK, APC, ATM, AXIN2, BAP1, BARD1, BLM, BMPR1A, BRCA1, BRCA2, BRIP1, CASR, CDC73, CDH1, CDK4, CDKN1B, CDKN1C, CDKN2A, CEBPA, CHEK2, CTNNA1, DICER1, DIS3L2, EGFR, EPCAM, FH, FLCN, GATA2, GPC3, GREM1, HOXB13, HRAS, KIT, MAX, MEN1, MET, MITF, MLH1, MSH2, MSH3, MSH6, MUTYH, NBN, NF1, NF2, NTHL1, PALB2, PDGFRA, PHOX2B, PMS2, POLD1, POLE, POT1, PRKAR1A, PTCH1, PTEN, RAD50, RAD51C, RAD51D, RB1, RECQL4, RET, RUNX1, SDHA, SDHAF2, SDHB, SDHC, SDHD, SMAD4, SMARCA4, SMARCB1, SMARCE1, STK11, SUFU, TERC, TERT, TMEM127, TP53, TSC1, TSC2, VHL, WRN, WT1).  Testing revealed a pathogenic mutation in the MITF gene called c.952G>A (p.Glu318Lys) and A Variant of Unknown Significance (VUS) was also detected: POLD1 c.301A>T (p.Ile101Phe).   12/16/2017 Breast US   IMPRESSION: 1. Stable to minimal decrease in size of multiple irregular masses throughout the right breast consistent with  the patient's sites of biopsy proven malignancy. While the mammographic appearance is slightly less dense in this region, there is increased skin and trabecular thickening involving the entire right breast. 2. Stable appearance of morphologically abnormal right axillary lymph nodes, consistent with biopsy proven metastatic disease. 3. Stable appearance of a left breast mass, consistent with biopsy proven malignancy.     04/03/2018 Cancer Staging   Staging form: Breast, AJCC 8th Edition - Pathologic stage from 04/03/2018: No Stage Recommended (ypT3, pN2a, cM0, G3, ER+, PR-, HER2-) - Signed by Malachy Mood, MD on 05/03/2018   10/19/2018 -  Anti-estrogen oral therapy   Adjuvant letrozole 2.5mg  daily     Survivorship   Per Santiago Glad, NP    Malignant neoplasm of left breast in female, estrogen receptor positive (HCC)  09/09/2017 Breast US   Targeted left breast ultrasound is performed, showing an oval parallel hypoechoic mass containing microcalcifications at the 2 o'clock position approximately 6 cm from nipple measuring approximately  0.9 x 0.5 x 0.9 cm, corresponding to the mammographic finding.   Sonographic evaluation of the left axilla demonstrates no pathologic lymphadenopathy.   09/23/2017 Initial Biopsy   Diagnosis 1. Breast, left, needle core biopsy, 2 o'clock - INVASIVE DUCTAL CARCINOMA. - SEE COMMENT. 2. Breast, right, needle core biopsy, 11:30 o'clock - INVASIVE MAMMARY CARCINOMA. - SEE COMMENT. 3. Lymph node, needle/core biopsy, right axilla - METASTATIC MAMMARY CARCINOMA IN 1 OF 1 LYMPH NODE (1/1). Microscopic Comment 1. There is a small focus of grade I invasive ductal carcinoma. A complete breast prognostic profile will be attempted and the results reported separately. The results were called to the [facility] on 09/24/2017. 2. The carcinoma appears grade II. An E-Cadherin stain and a breast prognostic profile will be performed on part 2 and the results  reported separately. (JBK:kh 09-24-17) [name]  1. HER2 Negative by FISH Estrogen receptor: 100% positive, strong staining Progesterone receptor: 80% positive, strong staining Ki67: 5%  2. HER2 Negative by FISH Estrogen receptor: 100% positive, strong staining Progesterone receptor: 0%, negative  Ki67: 30%   10/08/2017 Initial Diagnosis   Malignant neoplasm of left breast in female, estrogen receptor positive (HCC)   12/16/2017 Breast US   IMPRESSION: 3. Stable appearance of a left breast mass, consistent with biopsy proven malignancy.     01/22/2018 Imaging   01/22/2018 MRI Bilateral Breast IMPRESSION: 1. Dominant enhancing mass with surrounding nodularity involving the central anterior third of the right breast now measures up to 6.4 x 6.7 x 7.6 cm (previously measured 6.8 x 9.6 x 7.2 cm). Persistent bulky right axillary lymphadenopathy, slightly decreased in size since prior MRI.   2. Biopsy proven left breast malignancy now measures 0.5 cm (previously measured 0.9 x 0.8 x 0.7 cm).    04/03/2018 Pathology Results   Diagnosis 1. Breast, lumpectomy, left with radioactive seed - RESIDUAL INVASIVE DUCTAL CARCINOMA STATUS POST NEOADJUVANT THERAPY (0.9 CM) - MARGINS UNINVOLVED BY CARCINOMA (0.2 CM; INFERIOR MARGIN) - CALCIFICATIONS ASSOCIATED WITH CARCINOMA - PREVIOUS BIOPSY SITE CHANGES PRESENT - SEE ONCOLOGY TABLE AND COMMENT BELOW 2. Lymph node, sentinel, biopsy, left axillary #1 - NO CARCINOMA IDENTIFIED IN ONE LYMPH NODE (0/1) - SEE COMMENT 3. Lymph node, sentinel, biopsy, left axillary #2 - NO CARCINOMA IDENTIFIED IN ONE LYMPH NODE (0/1) - SEE COMMENT 4. Lymph node, sentinel, biopsy, left - NO CARCINOMA IDENTIFIED IN ONE LYMPH NODE (0/1) - SEE COMMENT 5. Lymph node, sentinel, biopsy, left axillary #3 - NO CARCINOMA IDENTIFIED IN ONE LYMPH NODE (0/1) - SEE COMMENT 6. Breast, modified radical mastectomy , right - RESIDUAL INVASIVE DUCTAL CARCINOMA STATUS POST  NEOADJUVANT THERAPY (8.5 CM) - EXTENSIVE LYMPHOVASCULAR SPACE INVASION PRESENT - METASTATIC CARCINOMA INVOLVING FIVE OF FIFTEEN LYMPH NODES WITH EXTRACAPSULAR EXTENSION (5/15) - MARGINS UNINVOLVED BY CARCINOMA - SEE ONCOLOGY TABLE AND COMMENT BELOW   04/03/2018 Receptors her2   The tumor cells are NEGATIVE for Her2 (1+). Estrogen Receptor: 95%, POSITIVE, STRONG STAINING INTENSITY Progesterone Receptor: 95%, POSITIVE, STRONG STAINING INTENSITY  The tumor cells are NEGATIVE for Her2 (1+). Estrogen Receptor: 100%, POSITIVE, STRONG STAINING INTENSITY Progesterone Receptor: 0%, NEGATIVE   04/03/2018 Cancer Staging   Staging form: Breast, AJCC 8th Edition - Pathologic stage from 04/03/2018: No Stage Recommended (ypT1b, pN0, cM0, G1, ER+, PR+, HER2-) - Signed by Malachy Mood, MD on 05/03/2018   04/03/2018 Surgery   Left LEFT BREAST LUMPECTOMY WITH RADIOACTIVE SEED AND LEFT SENTINEL LYMPH NODE BIOPSY with Claud Kelp, MD    05/20/2018 -  07/08/2018 Radiation Therapy   Daily adjuvant radiation treatment with Dr.Moody.    Radiation treatment dates:   05/20/2018 - 07/08/2018   Site/dose:   The patient initially received a dose of 50.4 Gy in 28 fractions to the right chest wall and supraclavicular region. This was delivered using a 3-D conformal, 4 field technique. The patient then received a boost to the mastectomy scar. This delivered an additional 10 Gy in 5 fractions using an en face electron field. The total dose was 60.4 Gy. At the same time, the patient received a dose of 50.4 Gy in 28 fractions to the left breast using whole-breast tangent fields. This was delivered using a 3-D conformal technique. The patient then received a boost to the seroma. This delivered an additional 10 Gy in 5 fractions using 15E electrons with a special teletherapy technique. The total dose was 60.4 Gy.   10/19/2018 -  Anti-estrogen oral therapy   Letrozole 2.5mg  daily started 07/2018     Survivorship   Per  Santiago Glad, NP        REVIEW OF SYSTEMS:   Constitutional: Denies fevers, chills or abnormal weight loss Eyes: Denies blurriness of vision Ears, nose, mouth, throat, and face: Denies mucositis or sore throat Respiratory: Denies cough, dyspnea or wheezes Cardiovascular: Denies palpitation, chest discomfort or lower extremity swelling Gastrointestinal:  Denies nausea, heartburn or change in bowel habits Skin: Denies abnormal skin rashes.  2 small, cystic lesions on left upper shoulder blade. Lymphatics: Denies new lymphadenopathy or easy bruising Neurological:Denies numbness, tingling or new weaknesses Behavioral/Psych: Mood is stable, no new changes  Musculoskeletal: Low back pain with bilateral hip pain.  Worse on left side.  Pain medication prescribed by primary care not helping tremendously. All other systems were reviewed with the patient and are negative.   VITALS:   Today's Vitals   05/02/23 1354 05/02/23 1403  BP: 121/78   Pulse: 81   Resp: 17   Temp: 98.3 F (36.8 C)   TempSrc: Temporal   SpO2: 95%   Weight: 148 lb 1.6 oz (67.2 kg)   Height: 5\' 4"  (1.626 m)   PainSc:  7    Body mass index is 25.42 kg/m.   Wt Readings from Last 3 Encounters:  05/02/23 148 lb 1.6 oz (67.2 kg)  02/19/23 145 lb 3.2 oz (65.9 kg)  11/29/22 150 lb (68 kg)    Body mass index is 25.42 kg/m.  Performance status (ECOG): 2 - Symptomatic, <50% confined to bed  PHYSICAL EXAM:   GENERAL:alert, no distress and comfortable SKIN: skin color, texture, turgor are normal, no rashes or significant lesions.  2 small, round, smooth lesions on posterior left shoulder blade.  No redness or tenderness present. EYES: normal, Conjunctiva are pink and non-injected, sclera clear OROPHARYNX:no exudate, no erythema and lips, buccal mucosa, and tongue normal  NECK: supple, thyroid normal size, non-tender, without nodularity LYMPH:  no palpable lymphadenopathy in the cervical, axillary or  inguinal LUNGS: clear to auscultation and percussion with normal breathing effort HEART: regular rate & rhythm and no murmurs and no lower extremity edema ABDOMEN:abdomen soft, non-tender and normal bowel sounds Musculoskeletal:no cyanosis of digits and no clubbing.  Tenderness of lower back with palpation.  No bony abnormalities or deformities palpated today.  Patient in wheelchair to help with mobility. NEURO: alert & oriented x 3 with fluent speech, no focal motor/sensory deficits  LABORATORY DATA:  I have reviewed the data as listed    Component Value Date/Time  NA 142 02/19/2023 1114   NA 143 09/10/2017 1243   K 4.3 02/19/2023 1114   CL 108 02/19/2023 1114   CO2 28 02/19/2023 1114   GLUCOSE 98 02/19/2023 1114   BUN 21 02/19/2023 1114   BUN 25 09/10/2017 1243   CREATININE 0.90 02/19/2023 1114   CALCIUM 9.5 02/19/2023 1114   PROT 6.6 02/19/2023 1114   PROT 6.5 09/10/2017 1243   ALBUMIN 4.0 02/19/2023 1114   ALBUMIN 4.8 09/10/2017 1243   AST 13 (L) 02/19/2023 1114   ALT 9 02/19/2023 1114   ALKPHOS 64 02/19/2023 1114   BILITOT 0.4 02/19/2023 1114   GFRNONAA >60 02/19/2023 1114   GFRAA >60 01/20/2020 1256    Lab Results  Component Value Date   WBC 5.1 02/19/2023   NEUTROABS 2.7 02/19/2023   HGB 13.6 02/19/2023   HCT 38.7 02/19/2023   MCV 92.1 02/19/2023   PLT 205 02/19/2023     RADIOGRAPHIC STUDIES: DG HIPS BILAT WITH PELVIS 3-4 VIEWS  Result Date: 04/07/2023 CLINICAL DATA:  Bilateral hip pain EXAM: DG HIP (WITH OR WITHOUT PELVIS) 3-4V BILAT COMPARISON:  01/26/2021, 12/07/2018 FINDINGS: There is no evidence of hip fracture or dislocation. Mild joint space narrowing of both hips. Stable sclerotic lesion in the intertrochanteric left femur, unchanged since 2020 and benign. No suspicious bone lesion. IMPRESSION: Mild degenerative changes of both hips. No acute findings. Electronically Signed   By: Duanne Guess D.O.   On: 04/07/2023 19:44   DG Lumbar Spine 2-3  Views  Result Date: 04/07/2023 CLINICAL DATA:  Chronic back pain EXAM: LUMBAR SPINE - 2-3 VIEW COMPARISON:  12/07/2018 FINDINGS: There is no evidence of lumbar spine fracture. Unchanged grade 1 anterolisthesis of L3 on L4. Disc heights are relatively well preserved. Lower lumbar facet arthropathy. Aortic atherosclerosis. IMPRESSION: 1. No acute fracture or traumatic subluxation. 2. Unchanged grade 1 anterolisthesis of L3 on L4. 3. Lower lumbar facet arthropathy. Electronically Signed   By: Duanne Guess D.O.   On: 04/07/2023 19:42

## 2023-05-01 NOTE — Telephone Encounter (Signed)
This RN spoke with pt per her call -   She states continued bone pain now more focused on the left side.   She states per prior visit with Dr Clayborn Heron the bone pain discussed with potential need for a bone scan "to see if the cancer has gone to my bone"  She states she also has 2 new cystic lumps on her upper left back in her shoulder area.  This RN informed pt above information will be forwarded to her providers for best followup.  Return call number [phone]

## 2023-05-01 NOTE — Assessment & Plan Note (Addendum)
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  -She is s/p neoadjuvant chemo with AC-T, underwent left breast lumpectomy and right mastectomy. She had a very limited response to neoadjuvant chemotherapy. She also completed adjuvant radiation.  -She started antiestrogen therapy with Letrozole in 10/2018, changed to Tamoxifen 02/2022 due to osteoporosis  -Continue surveillance and Tamoxifen (total 7 years of anti-estrogen), refilled -L mammo due 03/2023 -F/up in 6 months, or sooner if needed

## 2023-05-01 NOTE — Assessment & Plan Note (Signed)
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  -She is s/p neoadjuvant chemo with AC-T, underwent left breast lumpectomy and right mastectomy. She had a very limited response to neoadjuvant chemotherapy. She also completed adjuvant radiation.  -She started antiestrogen therapy with Letrozole in 10/2018, changed to Tamoxifen 02/2022 due to osteoporosis  -Continue surveillance and Tamoxifen (total 7 years of anti-estrogen), refilled -L mammo due 03/2023 -F/up in 6 months, or sooner if needed

## 2023-05-02 ENCOUNTER — Inpatient Hospital Stay: Payer: 59

## 2023-05-02 ENCOUNTER — Inpatient Hospital Stay: Payer: 59 | Attending: Hematology | Admitting: Nurse Practitioner

## 2023-05-02 ENCOUNTER — Other Ambulatory Visit: Payer: Self-pay | Admitting: Nurse Practitioner

## 2023-05-02 VITALS — BP 121/78 | HR 81 | Temp 98.3°F | Resp 17 | Ht 64.0 in | Wt 148.1 lb

## 2023-05-02 DIAGNOSIS — C50811 Malignant neoplasm of overlapping sites of right female breast: Secondary | ICD-10-CM | POA: Insufficient documentation

## 2023-05-02 DIAGNOSIS — Z1501 Genetic susceptibility to malignant neoplasm of breast: Secondary | ICD-10-CM | POA: Diagnosis not present

## 2023-05-02 DIAGNOSIS — Z1509 Genetic susceptibility to other malignant neoplasm: Secondary | ICD-10-CM | POA: Insufficient documentation

## 2023-05-02 DIAGNOSIS — C50412 Malignant neoplasm of upper-outer quadrant of left female breast: Secondary | ICD-10-CM | POA: Insufficient documentation

## 2023-05-02 DIAGNOSIS — Z170 Estrogen receptor positive status [ER+]: Secondary | ICD-10-CM

## 2023-05-02 DIAGNOSIS — C773 Secondary and unspecified malignant neoplasm of axilla and upper limb lymph nodes: Secondary | ICD-10-CM | POA: Diagnosis not present

## 2023-05-02 LAB — CMP (CANCER CENTER ONLY)
ALT: 10 U/L (ref 0–44)
AST: 15 U/L (ref 15–41)
Albumin: 3.9 g/dL (ref 3.5–5.0)
Alkaline Phosphatase: 86 U/L (ref 38–126)
Anion gap: 7 (ref 5–15)
BUN: 21 mg/dL (ref 8–23)
CO2: 28 mmol/L (ref 22–32)
Calcium: 10 mg/dL (ref 8.9–10.3)
Chloride: 103 mmol/L (ref 98–111)
Creatinine: 0.79 mg/dL (ref 0.44–1.00)
GFR, Estimated: >60 mL/min (ref >60)
Glucose, Bld: 98 mg/dL (ref 70–99)
Potassium: 4.2 mmol/L (ref 3.5–5.1)
Sodium: 138 mmol/L (ref 135–145)
Total Bilirubin: 0.3 mg/dL (ref <1.2)
Total Protein: 6.9 g/dL (ref 6.5–8.1)

## 2023-05-02 LAB — CBC WITH DIFFERENTIAL (CANCER CENTER ONLY)
Abs Immature Granulocytes: 0.04 10*3/uL (ref 0.00–0.07)
Basophils Absolute: 0 10*3/uL (ref 0.0–0.1)
Basophils Relative: 1 %
Eosinophils Absolute: 0.1 10*3/uL (ref 0.0–0.5)
Eosinophils Relative: 2 %
HCT: 36.3 % (ref 36.0–46.0)
Hemoglobin: 12.2 g/dL (ref 12.0–15.0)
Immature Granulocytes: 1 %
Lymphocytes Relative: 23 %
Lymphs Abs: 1.7 10*3/uL (ref 0.7–4.0)
MCH: 31 pg (ref 26.0–34.0)
MCHC: 33.6 g/dL (ref 30.0–36.0)
MCV: 92.4 fL (ref 80.0–100.0)
Monocytes Absolute: 0.5 10*3/uL (ref 0.1–1.0)
Monocytes Relative: 6 %
Neutro Abs: 5.1 10*3/uL (ref 1.7–7.7)
Neutrophils Relative %: 67 %
Platelet Count: 225 10*3/uL (ref 150–400)
RBC: 3.93 MIL/uL (ref 3.87–5.11)
RDW: 13.5 % (ref 11.5–15.5)
WBC Count: 7.5 10*3/uL (ref 4.0–10.5)
nRBC: 0 % (ref 0.0–0.2)

## 2023-05-02 NOTE — Progress Notes (Signed)
ca

## 2023-05-02 NOTE — Telephone Encounter (Signed)
She is on my schedule for later today. Herbert Seta, NP

## 2023-05-03 LAB — CANCER ANTIGEN 27.29: CA 27.29: 1379.4 U/mL — ABNORMAL HIGH (ref 0.0–38.6)

## 2023-05-04 ENCOUNTER — Encounter: Payer: Self-pay | Admitting: Nurse Practitioner

## 2023-05-04 ENCOUNTER — Encounter: Payer: Self-pay | Admitting: Hematology

## 2023-05-05 ENCOUNTER — Other Ambulatory Visit: Payer: Self-pay

## 2023-05-05 ENCOUNTER — Other Ambulatory Visit: Payer: Self-pay | Admitting: Nurse Practitioner

## 2023-05-05 DIAGNOSIS — Z170 Estrogen receptor positive status [ER+]: Secondary | ICD-10-CM

## 2023-05-05 MED ORDER — OXYCODONE HCL 5 MG PO TABS
5.0000 mg | ORAL_TABLET | Freq: Three times a day (TID) | ORAL | 0 refills | Status: DC | PRN
Start: 2023-05-05 — End: 2023-05-12

## 2023-05-05 NOTE — Progress Notes (Signed)
Thank you, thank you! Is patient aware of appointment? Also, can we have her schedule a follow up Monday to discuss pet results?

## 2023-05-05 NOTE — Progress Notes (Signed)
Spoke with patient over the phone regarding lab results, specifically the markedly elevated Ca 27.29. a STAT order for PET scan was placed and she is aware that she will get phone call to schedule this. Since vicodin not very effective in relieving new pain, a prescription for oxycodone 5 mg was sent to the pharmacy. This may be taken up to three times daily as needed for pain.

## 2023-05-06 ENCOUNTER — Ambulatory Visit (HOSPITAL_COMMUNITY): Payer: 59

## 2023-05-06 ENCOUNTER — Other Ambulatory Visit: Payer: Self-pay | Admitting: Nurse Practitioner

## 2023-05-06 ENCOUNTER — Other Ambulatory Visit: Payer: 59

## 2023-05-06 ENCOUNTER — Encounter: Payer: Self-pay | Admitting: Hematology

## 2023-05-06 DIAGNOSIS — C50412 Malignant neoplasm of upper-outer quadrant of left female breast: Secondary | ICD-10-CM

## 2023-05-06 DIAGNOSIS — C50811 Malignant neoplasm of overlapping sites of right female breast: Secondary | ICD-10-CM

## 2023-05-06 NOTE — Progress Notes (Signed)
PET scan scheduled 05/06/2023 not approved by insurance. Changed order to NM whole body bone scan for further evaluation of new, severe bone pain and markedly elevated Ca 27.29.

## 2023-05-07 ENCOUNTER — Ambulatory Visit (HOSPITAL_COMMUNITY)
Admission: RE | Admit: 2023-05-07 | Discharge: 2023-05-07 | Disposition: A | Discharge status: Home / Self Care | Payer: 59 | Source: Ambulatory Visit | Attending: Nurse Practitioner | Admitting: Nurse Practitioner

## 2023-05-07 ENCOUNTER — Encounter (HOSPITAL_COMMUNITY)
Admission: RE | Admit: 2023-05-07 | Discharge: 2023-05-07 | Disposition: A | Discharge status: Home / Self Care | Payer: 59 | Source: Ambulatory Visit | Attending: Nurse Practitioner | Admitting: Nurse Practitioner

## 2023-05-07 DIAGNOSIS — C50412 Malignant neoplasm of upper-outer quadrant of left female breast: Secondary | ICD-10-CM | POA: Diagnosis present

## 2023-05-07 DIAGNOSIS — Z170 Estrogen receptor positive status [ER+]: Secondary | ICD-10-CM

## 2023-05-07 DIAGNOSIS — C50811 Malignant neoplasm of overlapping sites of right female breast: Secondary | ICD-10-CM | POA: Diagnosis present

## 2023-05-07 MED ORDER — TECHNETIUM TC 99M MEDRONATE IV KIT
20.0000 | PACK | Freq: Once | INTRAVENOUS | Status: AC | PRN
  Administered 2023-05-07: 20.7 via INTRAVENOUS

## 2023-05-09 NOTE — Progress Notes (Signed)
Appointment 12/2 to review and plan for future treatment.

## 2023-05-11 NOTE — Progress Notes (Unsigned)
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  -She is s/p neoadjuvant chemo with AC-T, underwent left breast lumpectomy and right mastectomy. She had a very limited response to neoadjuvant chemotherapy. She also completed adjuvant radiation.  -She started antiestrogen therapy with Letrozole in 10/2018, changed to Tamoxifen 02/2022 due to osteoporosis  -Continue surveillance and Tamoxifen (total 7 years of anti-estrogen), refilled -L mammo due 03/2023 -05/02/2023 -complaints of low back and bilateral hip pain which were making ADLs difficult. --Ca 27.29 significantly elevated at 1379.4.  This had previously been at normal levels.  Her CBC and CMP were unremarkable.  A nuclear medicine whole-body bone scan was ordered as a result of rising CA 27.29.  This study showed new multifocal skeletal metastases within the axillary and appendicular skeleton.  Specifically, new multifocal lesions were noted in the thoracic spine and upper lumbar spine.  There is a broad lesion within the right sacrum.  There is a focally in the left scapula and posterior left and right ribs.  There are  multifocal lesions within the calvarium.  There are likely metastatic lesions within the left and right femurs.  A lesion was noted in the proximal right humerus as well as the humeral heads.   Plan: Reviewed labs from prior visit and NM whole body bone scan results.  -CA 27.29 was significantly elevated at 1379.4.  This had previously been at normal levels.   -CBC and CMP were unremarkable.   -NM whole-body bone scan showed new multifocal skeletal metastases within the axillary and appendicular skeleton.  Specifically, new multifocal lesions were noted in the thoracic spine and upper lumbar spine.  There is a broad lesion within the right sacrum.  There is a focally in the left scapula and posterior left and right ribs.  There are multifocal lesions within the calvarium.  There are likely metastatic lesions within the left and right femurs.  A lesion was noted in the proximal right humerus as well as the humeral heads.  Renew current prescription for oxycodone, 5 to 10 mg, up to 3 times daily if needed for severe pain.  Discussed referral to [facility], NP, palliative care, to help control pain long-term.  Patient and her husband are agreeable to this.  Referral placed today. CT chest abdomen pelvis to be scheduled for further evaluation. Referral to interventional radiology for bone biopsy made today. Ronda Fairly, social work, will contact patient with information regarding social services and financial assistance. Patient to follow-up in 2 to 3 weeks to review restaging scan and bone biopsy and plan for further treatment.  The patient and her husband are in agreement with this plan.  The patient understands the plans discussed today and is in agreement with them.  She knows to contact our office if she develops concerns prior to her next appointment.  I provided 30 minutes of face-to-face time during this encounter and > 50% was spent counseling as documented under my assessment and plan.    [name], NP  [facility] - A DEPT OF [facility] [address] [place] [address] Dept: [phone] Dept Fax: [phone]   Orders Placed This Encounter  Procedures   CT CHEST ABDOMEN PELVIS W CONTRAST    Standing Status:   Future    Standing Expiration Date:   05/11/2024    Order Specific Question:   If indicated for the ordered procedure, I authorize the administration of contrast media per Radiology protocol    Answer:   Yes    Order Specific Question:   Does the patient have a contrast media/X-ray dye allergy?    Answer:   No    Order Specific Question:   Preferred imaging location?    Answer:   [facility]    Order Specific Question:   If indicated for the ordered procedure, I authorize the administration of oral contrast media per Radiology protocol    Answer:   Yes   Amb Referral to Palliative Care    Referral Priority:   Routine    Referral Type:   Consultation    Number of Visits Requested:   1   Ambulatory referral to Interventional Radiology    Referral Priority:   Routine    Referral Type:   Consultation    Referral Reason:   Specialty Services Required    Requested Specialty:   Interventional Radiology    Number of Visits Requested:   1      CHIEF COMPLAINT:  CC: Bilateral breast cancer  Current Treatment: Currently tamoxifen on hold due to bone and joint pain.  INTERVAL HISTORY:  Kambra is here today for repeat clinical assessment.  She last saw me on 05/02/2023.  She had complaint of bone pain and low back and bilateral hips.  She had already had x-rays done of her pelvis and lumbar spine per her primary care provider.  She did have grade 1 anterolisthesis of L3 and L4.  She also had degenerative changes in both hips.  Primary care had given short-term prescription for Vicodin which was not helping pain.  Lab work done the day she was seen included CA 27.29.  This level was  significantly elevated at 1379.4.  This had previously been at normal levels.  Her CBC and CMP were unremarkable.  A nuclear medicine whole-body bone scan was ordered as a result of rising CA 27.29.  This study showed new multifocal skeletal metastases within the axillary and appendicular skeleton.  Specifically, new multifocal lesions were noted in the thoracic spine and upper lumbar spine.  There is a broad lesion within the right sacrum.  There is a focally in the left scapula and posterior left and right ribs.  There are multifocal lesions within the calvarium.  There are likely metastatic lesions within the left and right femurs.  A lesion was noted in the proximal right humerus as well as the humeral heads.  She reports continued pain, mostly in right arm and left hip.  She states that right shoulder pain has gotten so severe it hurts even to touch.  Very limited range of motion in the right arm noted.  Has taken some extra oxycodone to help  control the pain.  This is working better than previously prescribed pain medications.  Discussed referral to [facility], NP, palliative care, to help with more long-term pain management.  Patient is agreeable.  She denies fevers or chills. Her appetite is good. Her weight has increased 6 pounds over last 2 weeks .  I have reviewed the past medical history, past surgical history, social history and family history with the patient and they are unchanged from previous note.  ALLERGIES:  is allergic to bee venom and tramadol.  MEDICATIONS:  Current Outpatient Medications  Medication Sig Dispense Refill   amLODipine (NORVASC) 5 MG tablet Take 1 tablet (5 mg total) by mouth daily. 90 tablet 1   b complex vitamins tablet Take 1 tablet by mouth daily. (Patient not taking: Reported on 05/12/2023)     benzonatate (TESSALON) 100 MG capsule Take 1 capsule (100 mg total) by mouth every 8 (eight) hours. (Patient not taking: Reported on 02/19/2023) 21 capsule 0   magnesium chloride  (SLOW-MAG) 64 MG TBEC SR tablet Take by mouth. (Patient not taking: Reported on 02/19/2023)     magnesium citrate SOLN Take 1 Bottle by mouth once. (Patient not taking: Reported on 02/19/2023)     magnesium gluconate (MAGONATE) 500 MG tablet Take 500 mg by mouth daily. (Patient not taking: Reported on 05/12/2023)     Nutritional Supplements (VITAMIN D BOOSTER PO) Take by mouth. (Patient not taking: Reported on 02/19/2023)     ondansetron (ZOFRAN) 4 MG tablet Take 1 tablet (4 mg total) by mouth every 6 (six) hours. (Patient not taking: Reported on 05/12/2023) 12 tablet 0   oxyCODONE (OXY IR/ROXICODONE) 5 MG immediate release tablet Take 1-2 tablets (5-10 mg total) by mouth 3 (three) times daily as needed for severe pain (pain score 7-10). 45 tablet 0   tamoxifen (NOLVADEX) 20 MG tablet Take 1 tablet (20 mg total) by mouth daily. (Patient not taking: Reported on 05/12/2023) 90 tablet 3   Ubiquinol (UBQH) 100 MG CAPS Take by mouth. (Patient not taking: Reported on 02/19/2023)     No current facility-administered medications for this visit.    HISTORY OF PRESENT ILLNESS:   Oncology History Overview Note  Cancer Staging Cancer of overlapping sites of right female breast [facility]) Staging form: Breast, AJCC 8th Edition - Clinical stage from 09/23/2017: Stage IIIA (cT3, cN1, cM0, G2, ER+, PR-, HER2-) - Signed by Malachy Mood, MD on 10/09/2017 - Pathologic stage from 04/03/2018: No Stage Recommended (ypT3, pN2a, cM0, G3, ER+, PR-, HER2-) - Signed by Malachy Mood, MD on 05/03/2018  Malignant neoplasm of left breast in female, estrogen receptor positive (HCC) Staging form: Breast, AJCC 8th Edition - Clinical: Stage IA (cT1b, cN0, cM0, G1, ER+, PR+, HER2-) - Unsigned - Pathologic stage from 04/03/2018: No Stage Recommended (ypT1b, pN0, cM0, G1, ER+, PR+, HER2-) - Signed by Malachy Mood, MD on 05/03/2018     Cancer of overlapping sites of right female breast (HCC)  09/09/2017 Mammogram   IMPRESSION: 1. Highly suspicious  large right breast mass involving the outer and upper breast extending from approximately 9 o'clock through 12-1 o'clock, maximum measurement approximating 7.5 cm. The mass involves the right nipple, accounting for nipple retraction. Architectural distortion, microcalcifications and diffuse trabecular thickening and skin thickening are associated with the large mass, raising the possibility of this representing an inflammatory cancer. 2. Satellite masses separate from the dominant mass involving the lower outer quadrant and the upper inner quadrant of the right breast, measured above. 3. Two adjacent  pathologic right axillary lymph nodes. 4. Indeterminate 0.9 cm solid mass involving the upper outer quadrant of the left breast which accounts for a mammographic finding. 5. No pathologic left axillary lymphadenopathy.   09/09/2017 Breast US   Targeted right breast ultrasound is performed, showing a very large hypoechoic mass with irregular margins extending from the approximate 9 o'clock position through the 12 to 1 o'clock position. On the CC gait image, the mass measures maximally approximately 7.5 cm. The mass has irregular margins, demonstrates acoustic shadowing, and demonstrates internal power Doppler flow. The mass extends into the retroareolar region and involves the nipple, accounting for the nipple retraction.   Separate from the dominant mass at the 7 o'clock position approximately 4 cm from nipple is a hypoechoic antiparallel mass with irregular margins measuring approximately 1.4 x 2.7 x 1.7 cm.  Separate from the dominant mass at the 1 o'clock position approximately 2 cm from nipple is a hypoechoic mass with irregular margins measuring approximately 1.7 x 0.5 x 1.6 cm. Both of these masses also demonstrate internal power Doppler flow.   Sonographic evaluation of the right axilla demonstrates 2 adjacent pathologic lymph nodes, the larger measuring approximately 2.5 x 2.5 x 2.8 cm, the smaller  measuring approximately 2.0 x 0.8 x 2.7 cm.   09/23/2017 Initial Biopsy   Diagnosis 1. Breast, left, needle core biopsy, 2 o'clock - INVASIVE DUCTAL CARCINOMA. - SEE COMMENT. 2. Breast, right, needle core biopsy, 11:30 o'clock - INVASIVE MAMMARY CARCINOMA. - SEE COMMENT. 3. Lymph node, needle/core biopsy, right axilla - METASTATIC MAMMARY CARCINOMA IN 1 OF 1 LYMPH NODE (1/1). Microscopic Comment 1. There is a small focus of grade I invasive ductal carcinoma. A complete breast prognostic profile will be attempted and the results reported separately. The results were called to the [facility] on 09/24/2017. 2. The carcinoma appears grade II. An E-Cadherin stain and a breast prognostic profile will be performed on part 2 and the results reported separately. (JBK:kh 09-24-17) [name]  1. HER2 Negative by FISH Estrogen receptor: 100% positive, strong staining Progesterone receptor: 80% positive, strong staining Ki67: 5%  2. HER2 Negative by FISH Estrogen receptor: 100% positive, strong staining Progesterone receptor: 0%, negative  Ki67: 30% 2. The tumor cells are strongly positive for E-cadherin, supporting a ductal phenotype   09/23/2017 Cancer Staging   Staging form: Breast, AJCC 8th Edition - Clinical stage from 09/23/2017: Stage IIIA (cT3, cN1, cM0, G2, ER+, PR-, HER2-) - Signed by Malachy Mood, MD on 10/09/2017   10/07/2017 Breast MRI   IMPRESSION: 1. Biopsy proven malignancy involving all 4 quadrants of the right breast, although predominantly located in the anterior third of the central upper right breast measures 6.8 x 9.6 x 7.2 cm.   2. Five pathologic right axillary lymph nodes, compatible with biopsy proven axillary metastases.   3. Small mass with associated biopsy related changes in the upper-outer left breast at site of known malignancy measures 0.9 x 0.8 x 0.7 cm. No abnormal lymph nodes seen in the left axilla.   RECOMMENDATION: Treatment plan for known  bilateral breast malignancy.   BI-RADS CATEGORY  6: Known biopsy-proven malignancy.   10/08/2017 Initial Diagnosis   Cancer of overlapping sites of right female breast (HCC)   10/13/2017 Pathology Results   Diagnosis 1. Breast, right, needle core biopsy, 1 o'clock - INVASIVE DUCTAL CARCINOMA. - SEE COMMENT. 2. Breast, right, needle core biopsy, 7 o'clock - INVASIVE DUCTAL CARCINOMA. - SEE COMMENT. Microscopic Comment 1. and 2.  The carcinoma in parts 1 and 2 is morphologically similar and appears grade II. Because breast prognostic profiles were performed on the patients previous case, [id number], it will not be repeated on the current case unless requested.    10/17/2017 Imaging   CT CAP IMPRESSION: 1. Infiltrative right breast mass with overlying skin thickening is identified compatible with known breast cancer. 2. Enlarged right axillary lymph nodes compatible with metastatic adenopathy. 3. No additional sites of disease identified within the chest and no evidence for metastasis to the abdomen or pelvis. 4. Aortic atherosclerosis and 3 vessel coronary artery atherosclerotic calcifications. Aortic Atherosclerosis (ICD10-I70.0).   10/17/2017 Imaging   Bone Scan FINDINGS: There are no foci of increased or decreased radiotracer uptake to suggest osseous metastatic disease. 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 There is degenerative type uptake within both shoulders and the left ankle.   Normal physiologic activity is identified within the kidneys and urinary bladder.   IMPRESSION: No evidence of osseous metastatic disease.   10/22/2017 -  Chemotherapy   AC q2 weeks x4 cycles starting 10/22/17-12/03/17, followed by weekly taxol x12 12/17/17-03/11/18   11/14/2017 Genetic Testing   The genes that were analyzed were the 83 genes on [facility]'s Multi-Cancer panel (ALK, APC, ATM, AXIN2, BAP1,  BARD1, BLM, BMPR1A, BRCA1, BRCA2, BRIP1, CASR, CDC73, CDH1, CDK4, CDKN1B, CDKN1C, CDKN2A, CEBPA, CHEK2, CTNNA1, DICER1, DIS3L2, EGFR, EPCAM, FH, FLCN, GATA2, GPC3, GREM1, HOXB13, HRAS, KIT, MAX, MEN1, MET, MITF, MLH1, MSH2, MSH3, MSH6, MUTYH, NBN, NF1, NF2, NTHL1, PALB2, PDGFRA, PHOX2B, PMS2, POLD1, POLE, POT1, PRKAR1A, PTCH1, PTEN, RAD50, RAD51C, RAD51D, RB1, RECQL4, RET, RUNX1, SDHA, SDHAF2, SDHB, SDHC, SDHD, SMAD4, SMARCA4, SMARCB1, SMARCE1, STK11, SUFU, TERC, TERT, TMEM127, TP53, TSC1, TSC2, VHL, WRN, WT1).  Testing revealed a pathogenic mutation in the MITF gene called c.952G>A (p.Glu318Lys) and A Variant of Unknown Significance (VUS) was also detected: POLD1 c.301A>T (p.Ile101Phe).   12/16/2017 Breast US   IMPRESSION: 1. Stable to minimal decrease in size of multiple irregular masses throughout the right breast consistent with the patient's sites of biopsy proven malignancy. While the mammographic appearance is slightly less dense in this region, there is increased skin and trabecular thickening involving the entire right breast. 2. Stable appearance of morphologically abnormal right axillary lymph nodes, consistent with biopsy proven metastatic disease. 3. Stable appearance of a left breast mass, consistent with biopsy proven malignancy.     04/03/2018 Cancer Staging   Staging form: Breast, AJCC 8th Edition - Pathologic stage from 04/03/2018: No Stage Recommended (ypT3, pN2a, cM0, G3, ER+, PR-, HER2-) - Signed by Malachy Mood, MD on 05/03/2018   10/19/2018 -  Anti-estrogen oral therapy   Adjuvant letrozole 2.5mg  daily     Survivorship   Per Santiago Glad, NP    05/07/2023 Imaging   Nuclear medicine whole-body bone scan FINDINGS: new multifocal radiotracer activity within the thoracic spine and upper lumbar spine. Broad lesion within the RIGHT sacrum. Focal lesion in the LEFT scapula and posterior LEFT and RIGHT ribs. Multifocal lesions within the calvarium. Probable metastatic lesions within  the LEFT and RIGHT femurs. Lesion the proximal RIGHT humerus as well as the humeral heads.   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   Malignant neoplasm of left breast in female, estrogen receptor positive (HCC)  09/09/2017 Breast US   Targeted left breast ultrasound is performed, showing an oval parallel hypoechoic mass containing microcalcifications at the  2 o'clock position approximately 6 cm from nipple measuring approximately 0.9 x 0.5 x 0.9 cm, corresponding to the mammographic finding.   Sonographic evaluation of the left axilla demonstrates no pathologic lymphadenopathy.   09/23/2017 Initial Biopsy   Diagnosis 1. Breast, left, needle core biopsy, 2 o'clock - INVASIVE DUCTAL CARCINOMA. - SEE COMMENT. 2. Breast, right, needle core biopsy, 11:30 o'clock - INVASIVE MAMMARY CARCINOMA. - SEE COMMENT. 3. Lymph node, needle/core biopsy, right axilla - METASTATIC MAMMARY CARCINOMA IN 1 OF 1 LYMPH NODE (1/1). Microscopic Comment 1. There is a small focus of grade I invasive ductal carcinoma. A complete breast prognostic profile will be attempted and the results reported separately. The results were called to the [facility] on 09/24/2017. 2. The carcinoma appears grade II. An E-Cadherin stain and a breast prognostic profile will be performed on part 2 and the results reported separately. (JBK:kh 09-24-17) [name]  1. HER2 Negative by FISH Estrogen receptor: 100% positive, strong staining Progesterone receptor: 80% positive, strong staining Ki67: 5%  2. HER2 Negative by FISH Estrogen receptor: 100% positive, strong staining Progesterone receptor: 0%, negative  Ki67: 30%   10/08/2017 Initial Diagnosis   Malignant neoplasm of left breast in female, estrogen receptor positive (HCC)   12/16/2017 Breast US   IMPRESSION: 3. Stable appearance of a left breast mass, consistent with biopsy proven malignancy.      01/22/2018 Imaging   01/22/2018 MRI Bilateral Breast IMPRESSION: 1. Dominant enhancing mass with surrounding nodularity involving the central anterior third of the right breast now measures up to 6.4 x 6.7 x 7.6 cm (previously measured 6.8 x 9.6 x 7.2 cm). Persistent bulky right axillary lymphadenopathy, slightly decreased in size since prior MRI.   2. Biopsy proven left breast malignancy now measures 0.5 cm (previously measured 0.9 x 0.8 x 0.7 cm).    04/03/2018 Pathology Results   Diagnosis 1. Breast, lumpectomy, left with radioactive seed - RESIDUAL INVASIVE DUCTAL CARCINOMA STATUS POST NEOADJUVANT THERAPY (0.9 CM) - MARGINS UNINVOLVED BY CARCINOMA (0.2 CM; INFERIOR MARGIN) - CALCIFICATIONS ASSOCIATED WITH CARCINOMA - PREVIOUS BIOPSY SITE CHANGES PRESENT - SEE ONCOLOGY TABLE AND COMMENT BELOW 2. Lymph node, sentinel, biopsy, left axillary #1 - NO CARCINOMA IDENTIFIED IN ONE LYMPH NODE (0/1) - SEE COMMENT 3. Lymph node, sentinel, biopsy, left axillary #2 - NO CARCINOMA IDENTIFIED IN ONE LYMPH NODE (0/1) - SEE COMMENT 4. Lymph node, sentinel, biopsy, left - NO CARCINOMA IDENTIFIED IN ONE LYMPH NODE (0/1) - SEE COMMENT 5. Lymph node, sentinel, biopsy, left axillary #3 - NO CARCINOMA IDENTIFIED IN ONE LYMPH NODE (0/1) - SEE COMMENT 6. Breast, modified radical mastectomy , right - RESIDUAL INVASIVE DUCTAL CARCINOMA STATUS POST NEOADJUVANT THERAPY (8.5 CM) - EXTENSIVE LYMPHOVASCULAR SPACE INVASION PRESENT - METASTATIC CARCINOMA INVOLVING FIVE OF FIFTEEN LYMPH NODES WITH EXTRACAPSULAR EXTENSION (5/15) - MARGINS UNINVOLVED BY CARCINOMA - SEE ONCOLOGY TABLE AND COMMENT BELOW   04/03/2018 Receptors her2   The tumor cells are NEGATIVE for Her2 (1+). Estrogen Receptor: 95%, POSITIVE, STRONG STAINING INTENSITY Progesterone Receptor: 95%, POSITIVE, STRONG STAINING INTENSITY  The tumor cells are NEGATIVE for Her2 (1+). Estrogen Receptor: 100%, POSITIVE, STRONG STAINING  INTENSITY Progesterone Receptor: 0%, NEGATIVE   04/03/2018 Cancer Staging   Staging form: Breast, AJCC 8th Edition - Pathologic stage from 04/03/2018: No Stage Recommended (ypT1b, pN0, cM0, G1, ER+, PR+, HER2-) - Signed by Malachy Mood, MD on 05/03/2018   04/03/2018 Surgery   Left LEFT BREAST LUMPECTOMY WITH RADIOACTIVE SEED AND LEFT SENTINEL LYMPH NODE  BIOPSY with Claud Kelp, MD    05/20/2018 - 07/08/2018 Radiation Therapy   Daily adjuvant radiation treatment with Dr.Moody.    Radiation treatment dates:   05/20/2018 - 07/08/2018   Site/dose:   The patient initially received a dose of 50.4 Gy in 28 fractions to the right chest wall and supraclavicular region. This was delivered using a 3-D conformal, 4 field technique. The patient then received a boost to the mastectomy scar. This delivered an additional 10 Gy in 5 fractions using an en face electron field. The total dose was 60.4 Gy. At the same time, the patient received a dose of 50.4 Gy in 28 fractions to the left breast using whole-breast tangent fields. This was delivered using a 3-D conformal technique. The patient then received a boost to the seroma. This delivered an additional 10 Gy in 5 fractions using 15E electrons with a special teletherapy technique. The total dose was 60.4 Gy.   10/19/2018 -  Anti-estrogen oral therapy   Letrozole 2.5mg  daily started 07/2018     Survivorship   Per Santiago Glad, NP    05/07/2023 Imaging   Nuclear medicine whole-body bone scan FINDINGS: new multifocal radiotracer activity within the thoracic spine and upper lumbar spine. Broad lesion within the RIGHT sacrum. Focal lesion in the LEFT scapula and posterior LEFT and RIGHT ribs. Multifocal lesions within the calvarium. Probable metastatic lesions within the LEFT and RIGHT femurs. Lesion the proximal RIGHT humerus as well as the humeral heads.   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        REVIEW OF SYSTEMS:   Constitutional: Denies fevers, chills or abnormal weight loss Eyes: Denies blurriness of vision Ears, nose, mouth, throat, and face: Denies mucositis or sore throat Respiratory: Denies cough, dyspnea or wheezes Cardiovascular: Denies palpitation, chest discomfort or lower extremity swelling Gastrointestinal:  Denies nausea, heartburn or change in bowel habits Skin: Denies abnormal skin rashes Lymphatics: Denies new lymphadenopathy or easy bruising Neurological:Denies numbness, tingling or new weaknesses Behavioral/Psych: Mood is stable, no new changes  Musculoskeletal: Severe joint pain, most severe in the left hip and right shoulder.  Limited range of motion due to pain.  Difficulty walking. All other systems were reviewed with the patient and are negative.   VITALS:   Today's Vitals   05/12/23 1113  BP: 138/78  Pulse: 70  Resp: 17  Temp: (!) 97.4 F (36.3 C)  TempSrc: Temporal  SpO2: 95%  Weight: 154 lb 6.4 oz (70 kg)  PainSc: 5    Body mass index is 26.5 kg/m.   Wt Readings from Last 3 Encounters:  05/12/23 154 lb 6.4 oz (70 kg)  05/02/23 148 lb 1.6 oz (67.2 kg)  02/19/23 145 lb 3.2 oz (65.9 kg)    Body mass index is 26.5 kg/m.  Performance status (ECOG): 2 - Symptomatic, <50% confined to bed  PHYSICAL EXAM:   GENERAL:alert, no distress and comfortable SKIN: skin color, texture, turgor are normal, no rashes or significant lesions EYES: normal, Conjunctiva are pink and non-injected, sclera clear OROPHARYNX:no exudate, no erythema and lips, buccal mucosa, and tongue normal  NECK: supple, thyroid normal size, non-tender, without nodularity LYMPH:  no palpable lymphadenopathy in the cervical, axillary or inguinal LUNGS: clear to auscultation and percussion with normal breathing effort HEART: regular rate & rhythm and no murmurs and no lower extremity edema ABDOMEN:abdomen soft, non-tender and normal bowel sounds Musculoskeletal:no  cyanosis of digits and no clubbing. Tenderness of lower back with  palpation. No bony abnormalities or deformities palpated today.  Right upper arm tender to palpation.  Range of motion and strength of right arm are limited due to pain.  Patient in wheelchair to help with mobility.  NEURO: alert & oriented x 3 with fluent speech, no focal motor/sensory deficits  LABORATORY DATA:  I have reviewed the data as listed    Component Value Date/Time   NA 138 05/02/2023 1449   NA 143 09/10/2017 1243   K 4.2 05/02/2023 1449   CL 103 05/02/2023 1449   CO2 28 05/02/2023 1449   GLUCOSE 98 05/02/2023 1449   BUN 21 05/02/2023 1449   BUN 25 09/10/2017 1243   CREATININE 0.79 05/02/2023 1449   CALCIUM 10.0 05/02/2023 1449   PROT 6.9 05/02/2023 1449   PROT 6.5 09/10/2017 1243   ALBUMIN 3.9 05/02/2023 1449   ALBUMIN 4.8 09/10/2017 1243   AST 15 05/02/2023 1449   ALT 10 05/02/2023 1449   ALKPHOS 86 05/02/2023 1449   BILITOT 0.3 05/02/2023 1449   GFRNONAA >60 05/02/2023 1449   GFRAA >60 01/20/2020 1256     Lab Results  Component Value Date   WBC 7.5 05/02/2023   NEUTROABS 5.1 05/02/2023   HGB 12.2 05/02/2023   HCT 36.3 05/02/2023   MCV 92.4 05/02/2023   PLT 225 05/02/2023     RADIOGRAPHIC STUDIES: NM Bone Scan Whole Body  Result Date: 05/07/2023 CLINICAL DATA:  Breast cancer.  Evaluate for metastatic disease. EXAM: NUCLEAR MEDICINE WHOLE BODY BONE SCAN TECHNIQUE: Whole body anterior and posterior images were obtained approximately 3 hours after intravenous injection of radiopharmaceutical. RADIOPHARMACEUTICALS:  20.7 mCi Technetium-59m MDP IV COMPARISON:  Nuclear medicine bone scan 10/17/2017 FINDINGS: new multifocal radiotracer activity within the thoracic spine and upper lumbar spine. Broad lesion within the RIGHT sacrum. Focal lesion in the LEFT scapula and posterior LEFT and RIGHT ribs. Multifocal lesions within the calvarium. Probable metastatic lesions within the LEFT and RIGHT femurs.  Lesion the proximal RIGHT humerus as well as the humeral heads. 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 Electronically Signed   By: Genevive Bi M.D.   On: 05/07/2023 14:26    Addendum I have seen the patient, examined her. I agree with the assessment and and plan and have edited the notes.   I personally reviewed her whole-body bone scan, which unfortunately showed diffuse bone metastasis.  Patient has developed significant diffuse bone/joint pain and fatigue in the past 5 to 6 months.  This is highly concerning for metastatic breast cancer.  I recommended CT chest, abdomen pelvis with contrast to rule out visceral metastasis.  Will refer her to IR for CT-guided bone biopsy, and repeat prognostic panel on biopsy.  If the biopsy confirmed ER positive and HER2 negative disease, will likely change her treatment to aromatase inhibitor and CDK4/6 inhibitor.  We also discussed pain management, and biphosphonate to prevent fractures.  All questions were answered, I spent a total of 30 minutes for her visit today, more than 50% time on face-to-face counseling.  Malachy Mood MD 05/12/2023

## 2023-05-11 NOTE — Assessment & Plan Note (Signed)
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  -She is s/p neoadjuvant chemo with AC-T, underwent left breast lumpectomy and right mastectomy. She had a very limited response to neoadjuvant chemotherapy. She also completed adjuvant radiation.  -She started antiestrogen therapy with Letrozole in 10/2018, changed to Tamoxifen 02/2022 due to osteoporosis  -Continue surveillance and Tamoxifen (total 7 years of anti-estrogen), refilled -L mammo due 03/2023 -05/02/2023 -complaints of low back and bilateral hip pain which were making ADLs difficult. --Ca 27.29 significantly elevated at 1379.4.  This had previously been at normal levels.  Her CBC and CMP were unremarkable.  A nuclear medicine whole-body bone scan was ordered as a result of rising CA 27.29.  This study showed new multifocal skeletal metastases within the axillary and appendicular skeleton.  Specifically, new multifocal lesions were noted in the thoracic spine and upper lumbar spine.  There is a broad lesion within the right sacrum.  There is a focally in the left scapula and posterior left and right ribs.  There are multifocal lesions within the calvarium.  There are likely metastatic lesions within the left and right femurs.  A lesion was noted in the proximal right humerus as well as the humeral heads.

## 2023-05-12 ENCOUNTER — Encounter: Payer: Self-pay | Admitting: Nurse Practitioner

## 2023-05-12 ENCOUNTER — Inpatient Hospital Stay: Payer: 59 | Attending: Hematology | Admitting: Nurse Practitioner

## 2023-05-12 ENCOUNTER — Inpatient Hospital Stay: Payer: 59 | Admitting: Nurse Practitioner

## 2023-05-12 ENCOUNTER — Other Ambulatory Visit: Payer: Self-pay

## 2023-05-12 VITALS — BP 138/78 | HR 70 | Temp 97.4°F | Resp 17 | Wt 154.4 lb

## 2023-05-12 DIAGNOSIS — Z79810 Long term (current) use of selective estrogen receptor modulators (SERMs): Secondary | ICD-10-CM | POA: Insufficient documentation

## 2023-05-12 DIAGNOSIS — C50412 Malignant neoplasm of upper-outer quadrant of left female breast: Secondary | ICD-10-CM | POA: Diagnosis present

## 2023-05-12 DIAGNOSIS — C50811 Malignant neoplasm of overlapping sites of right female breast: Secondary | ICD-10-CM | POA: Insufficient documentation

## 2023-05-12 DIAGNOSIS — Z170 Estrogen receptor positive status [ER+]: Secondary | ICD-10-CM | POA: Insufficient documentation

## 2023-05-12 DIAGNOSIS — C773 Secondary and unspecified malignant neoplasm of axilla and upper limb lymph nodes: Secondary | ICD-10-CM | POA: Insufficient documentation

## 2023-05-12 DIAGNOSIS — F17210 Nicotine dependence, cigarettes, uncomplicated: Secondary | ICD-10-CM | POA: Insufficient documentation

## 2023-05-12 DIAGNOSIS — C7951 Secondary malignant neoplasm of bone: Secondary | ICD-10-CM | POA: Insufficient documentation

## 2023-05-12 MED ORDER — OXYCODONE HCL 5 MG PO TABS
5.0000 mg | ORAL_TABLET | Freq: Three times a day (TID) | ORAL | 0 refills | Status: DC | PRN
Start: 2023-05-12 — End: 2023-05-21

## 2023-05-13 ENCOUNTER — Encounter: Payer: Self-pay | Admitting: Hematology

## 2023-05-13 NOTE — Progress Notes (Signed)
Darlene Mor, DO  Claudean Kinds; P Ir Procedure Requests I will reach out to physician to have order CT imaging.  We can remove for now. The new request when it comes needs to go to the general list not to my inbox.  [address]       Previous Messages    ----- Message ----- From: Claudean Kinds Sent: 05/13/2023   9:19 AM EST To: Claudean Kinds; Ir Procedure Requests Subject: CT Biopsy                                      Procedure: Ct Biopsy  Reason: bioposy of hypermetabolic bone lesion to confirm bone metastasis Dx: Malignant neoplasm of overlapping sites of right breast in female, estrogen receptor positive (HCC) [C50.811, Z17.0 (ICD-10-CM)]    History: NM whole body bone scan , xray bilateral hips, xray lumbar spine  Provider: Malachy Mood, MD  Provider contact :  [phone]  [email or web address]

## 2023-05-16 ENCOUNTER — Ambulatory Visit (HOSPITAL_COMMUNITY)
Admission: RE | Admit: 2023-05-16 | Discharge: 2023-05-16 | Disposition: A | Discharge status: Home / Self Care | Payer: 59 | Source: Ambulatory Visit | Attending: Nurse Practitioner | Admitting: Nurse Practitioner

## 2023-05-16 DIAGNOSIS — C50811 Malignant neoplasm of overlapping sites of right female breast: Secondary | ICD-10-CM | POA: Insufficient documentation

## 2023-05-16 DIAGNOSIS — Z170 Estrogen receptor positive status [ER+]: Secondary | ICD-10-CM | POA: Insufficient documentation

## 2023-05-16 MED ORDER — IOHEXOL 300 MG/ML  SOLN
100.0000 mL | Freq: Once | INTRAMUSCULAR | Status: AC | PRN
  Administered 2023-05-16: 100 mL via INTRAVENOUS

## 2023-05-19 NOTE — Progress Notes (Signed)
Berdine Dance, MD  Claudean Kinds [facility] for CT core bx left posterior iliac destructive large bone met  TS       Previous Messages    ----- Message ----- From: Claudean Kinds Sent: 05/19/2023   9:19 AM EST To: Claudean Kinds; Ir Procedure Requests Subject: CT Biopsy                                      Procedure : CT Biopsy  Reason : bioposy of hypermetabolic bone lesion to confirm bone metastasis Dx: Malignant neoplasm of overlapping sites of right breast in female, estrogen receptor positive (HCC) [C50.811, Z17.0 (ICD-10-CM)]   History : CT chest abd pelv w/  - done 12/6 , NM Bone scan , CT Head  Provider : Malachy Mood, MD  Provider contact :  [phone]

## 2023-05-21 ENCOUNTER — Telehealth: Payer: Self-pay

## 2023-05-21 ENCOUNTER — Other Ambulatory Visit: Payer: Self-pay

## 2023-05-21 ENCOUNTER — Inpatient Hospital Stay: Payer: 59 | Admitting: Nurse Practitioner

## 2023-05-21 ENCOUNTER — Inpatient Hospital Stay (HOSPITAL_BASED_OUTPATIENT_CLINIC_OR_DEPARTMENT_OTHER): Payer: 59 | Admitting: Hematology

## 2023-05-21 DIAGNOSIS — Z170 Estrogen receptor positive status [ER+]: Secondary | ICD-10-CM | POA: Diagnosis not present

## 2023-05-21 DIAGNOSIS — C50811 Malignant neoplasm of overlapping sites of right female breast: Secondary | ICD-10-CM

## 2023-05-21 MED ORDER — OXYCODONE HCL 5 MG PO TABS
5.0000 mg | ORAL_TABLET | Freq: Three times a day (TID) | ORAL | 0 refills | Status: DC | PRN
Start: 2023-05-21 — End: 2023-05-26

## 2023-05-21 NOTE — Progress Notes (Unsigned)
Palliative Medicine [facility]  [phone] [phone]   Name: Darlene Beasley Date: 05/21/2023 MRN: [id number]  DOB: [birth year]  Patient Care Team: Estevan Oaks, NP as PCP - General (Nurse Practitioner) Claud Kelp, MD as Consulting Physician (General Surgery) Malachy Mood, MD as Consulting Physician (Hematology) Dorothy Puffer, MD as Consulting Physician (Radiation Oncology) Pollyann Samples, NP as Nurse Practitioner (Nurse Practitioner)    REASON FOR CONSULTATION: Darlene Beasley is a 67 y.o. 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 Palliative ask to see for symptom management and goals of care.    SOCIAL HISTORY:     reports that she has been smoking cigarettes. She has a 25.5 pack-year smoking history. She has never used smokeless tobacco. She reports current alcohol use of about 1.0 standard drink of alcohol per week. She reports current drug use. Drug: Marijuana.  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)   Personal history of chemotherapy    Personal history of radiation therapy     PAST SURGICAL HISTORY:  Past Surgical History:  Procedure Laterality Date   BREAST LUMPECTOMY Left 04/03/2018   BREAST LUMPECTOMY WITH RADIOACTIVE SEED AND SENTINEL LYMPH NODE BIOPSY Left 04/03/2018   Procedure: LEFT BREAST LUMPECTOMY WITH RADIOACTIVE SEED AND LEFT SENTINEL LYMPH NODE BIOPSY;  Surgeon: Claud Kelp, MD;  Location: [facility];  Service: General;   Laterality: Left;   IR IMAGING GUIDED PORT INSERTION  10/20/2017   IR US GUIDE VASC ACCESS LEFT  10/20/2017   MASTECTOMY Right 04/03/2018   MASTECTOMY MODIFIED RADICAL Right 04/03/2018   Procedure: MASTECTOMY MODIFIED RADICAL;  Surgeon: Claud Kelp, MD;  Location: [facility];  Service: General;  Laterality: Right;   PORT-A-CATH REMOVAL N/A 04/03/2018   Procedure: REMOVAL PORT-A-CATH;  Surgeon: Claud Kelp, MD;  Location: [facility];  Service: General;  Laterality: N/A;   TONSILLECTOMY Bilateral    TUBAL LIGATION      HEMATOLOGY/ONCOLOGY HISTORY:  Oncology History Overview Note  Cancer Staging Cancer of overlapping sites of right female breast [facility]) Staging form: Breast, AJCC 8th Edition - Clinical stage from 09/23/2017: Stage IIIA (cT3, cN1, cM0, G2, ER+, PR-, HER2-) - Signed by Malachy Mood, MD on 10/09/2017 - Pathologic stage from 04/03/2018: No Stage Recommended (ypT3, pN2a, cM0, G3, ER+, PR-, HER2-) - Signed by Malachy Mood, MD on 05/03/2018  Malignant neoplasm of left breast in female, estrogen receptor positive (HCC) Staging form: Breast, AJCC 8th Edition - Clinical: Stage IA (cT1b, cN0, cM0, G1, ER+, PR+, HER2-) - Unsigned - Pathologic stage from 04/03/2018: No Stage Recommended (ypT1b, pN0, cM0, G1, ER+, PR+, HER2-) - Signed by Malachy Mood, MD on 05/03/2018     Cancer of overlapping sites of right female breast (HCC)  09/09/2017 Mammogram   IMPRESSION: 1. Highly suspicious large right breast mass involving the outer and upper breast extending from approximately 9 o'clock through 12-1 o'clock, maximum measurement approximating 7.5 cm. The mass involves the right nipple, accounting for nipple retraction.  Architectural distortion, microcalcifications and diffuse trabecular thickening and skin thickening are associated with the large mass, raising the possibility of this representing an inflammatory cancer. 2. Satellite masses separate from the dominant mass  involving the lower outer quadrant and the upper inner quadrant of the right breast, measured above. 3. Two adjacent pathologic right axillary lymph nodes. 4. Indeterminate 0.9 cm solid mass involving the upper outer quadrant of the left breast which accounts for a mammographic finding. 5. No pathologic left axillary lymphadenopathy.   09/09/2017 Breast US   Targeted right breast ultrasound is performed, showing a very large hypoechoic mass with irregular margins extending from the approximate 9 o'clock position through the 12 to 1 o'clock position. On the CC gait image, the mass measures maximally approximately 7.5 cm. The mass has irregular margins, demonstrates acoustic shadowing, and demonstrates internal power Doppler flow. The mass extends into the retroareolar region and involves the nipple, accounting for the nipple retraction.   Separate from the dominant mass at the 7 o'clock position approximately 4 cm from nipple is a hypoechoic antiparallel mass with irregular margins measuring approximately 1.4 x 2.7 x 1.7 cm.  Separate from the dominant mass at the 1 o'clock position approximately 2 cm from nipple is a hypoechoic mass with irregular margins measuring approximately 1.7 x 0.5 x 1.6 cm. Both of these masses also demonstrate internal power Doppler flow.   Sonographic evaluation of the right axilla demonstrates 2 adjacent pathologic lymph nodes, the larger measuring approximately 2.5 x 2.5 x 2.8 cm, the smaller measuring approximately 2.0 x 0.8 x 2.7 cm.   09/23/2017 Initial Biopsy   Diagnosis 1. Breast, left, needle core biopsy, 2 o'clock - INVASIVE DUCTAL CARCINOMA. - SEE COMMENT. 2. Breast, right, needle core biopsy, 11:30 o'clock - INVASIVE MAMMARY CARCINOMA. - SEE COMMENT. 3. Lymph node, needle/core biopsy, right axilla - METASTATIC MAMMARY CARCINOMA IN 1 OF 1 LYMPH NODE (1/1). Microscopic Comment 1. There is a small focus of grade I invasive ductal carcinoma. A complete breast  prognostic profile will be attempted and the results reported separately. The results were called to the [facility] on 09/24/2017. 2. The carcinoma appears grade II. An E-Cadherin stain and a breast prognostic profile will be performed on part 2 and the results reported separately. (JBK:kh 09-24-17) [name]  1. HER2 Negative by FISH Estrogen receptor: 100% positive, strong staining Progesterone receptor: 80% positive, strong staining Ki67: 5%  2. HER2 Negative by FISH Estrogen receptor: 100% positive, strong staining Progesterone receptor: 0%, negative  Ki67: 30% 2. The tumor cells are strongly positive for E-cadherin, supporting a ductal phenotype   09/23/2017 Cancer Staging   Staging form: Breast, AJCC 8th Edition - Clinical stage from 09/23/2017: Stage IIIA (cT3, cN1, cM0, G2, ER+, PR-, HER2-) - Signed by Malachy Mood, MD on 10/09/2017   10/07/2017 Breast MRI   IMPRESSION: 1. Biopsy proven malignancy involving all 4 quadrants of the right breast, although predominantly located in the anterior third of the central upper right breast measures 6.8 x 9.6 x 7.2 cm.   2. Five pathologic right axillary lymph nodes, compatible with biopsy proven axillary metastases.   3. Small mass with associated biopsy related changes in the upper-outer left breast at site of known malignancy measures 0.9 x 0.8 x 0.7 cm. No abnormal lymph nodes seen in the left axilla.   RECOMMENDATION: Treatment plan for known bilateral breast malignancy.   BI-RADS CATEGORY  6: Known biopsy-proven malignancy.   10/08/2017 Initial Diagnosis  Cancer of overlapping sites of right female breast (HCC)   10/13/2017 Pathology Results   Diagnosis 1. Breast, right, needle core biopsy, 1 o'clock - INVASIVE DUCTAL CARCINOMA. - SEE COMMENT. 2. Breast, right, needle core biopsy, 7 o'clock - INVASIVE DUCTAL CARCINOMA. - SEE COMMENT. Microscopic Comment 1. and 2. The carcinoma in parts 1 and 2 is morphologically  similar and appears grade II. Because breast prognostic profiles were performed on the patients previous case, [id number], it will not be repeated on the current case unless requested.    10/17/2017 Imaging   CT CAP IMPRESSION: 1. Infiltrative right breast mass with overlying skin thickening is identified compatible with known breast cancer. 2. Enlarged right axillary lymph nodes compatible with metastatic adenopathy. 3. No additional sites of disease identified within the chest and no evidence for metastasis to the abdomen or pelvis. 4. Aortic atherosclerosis and 3 vessel coronary artery atherosclerotic calcifications. Aortic Atherosclerosis (ICD10-I70.0).   10/17/2017 Imaging   Bone Scan FINDINGS: There are no foci of increased or decreased radiotracer uptake to suggest osseous metastatic disease. 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 There is degenerative type uptake within both shoulders and the left ankle.   Normal physiologic activity is identified within the kidneys and urinary bladder.   IMPRESSION: No evidence of osseous metastatic disease.   10/22/2017 -  Chemotherapy   AC q2 weeks x4 cycles starting 10/22/17-12/03/17, followed by weekly taxol x12 12/17/17-03/11/18   11/14/2017 Genetic Testing   The genes that were analyzed were the 83 genes on [facility]'s Multi-Cancer panel (ALK, APC, ATM, AXIN2, BAP1, BARD1, BLM, BMPR1A, BRCA1, BRCA2, BRIP1, CASR, CDC73, CDH1, CDK4, CDKN1B, CDKN1C, CDKN2A, CEBPA, CHEK2, CTNNA1, DICER1, DIS3L2, EGFR, EPCAM, FH, FLCN, GATA2, GPC3, GREM1, HOXB13, HRAS, KIT, MAX, MEN1, MET, MITF, MLH1, MSH2, MSH3, MSH6, MUTYH, NBN, NF1, NF2, NTHL1, PALB2, PDGFRA, PHOX2B, PMS2, POLD1, POLE, POT1, PRKAR1A, PTCH1, PTEN, RAD50, RAD51C, RAD51D, RB1, RECQL4, RET, RUNX1, SDHA, SDHAF2, SDHB, SDHC, SDHD, SMAD4, SMARCA4, SMARCB1, SMARCE1, STK11, SUFU, TERC, TERT, TMEM127, TP53, TSC1,  TSC2, VHL, WRN, WT1).  Testing revealed a pathogenic mutation in the MITF gene called c.952G>A (p.Glu318Lys) and A Variant of Unknown Significance (VUS) was also detected: POLD1 c.301A>T (p.Ile101Phe).   12/16/2017 Breast US   IMPRESSION: 1. Stable to minimal decrease in size of multiple irregular masses throughout the right breast consistent with the patient's sites of biopsy proven malignancy. While the mammographic appearance is slightly less dense in this region, there is increased skin and trabecular thickening involving the entire right breast. 2. Stable appearance of morphologically abnormal right axillary lymph nodes, consistent with biopsy proven metastatic disease. 3. Stable appearance of a left breast mass, consistent with biopsy proven malignancy.     04/03/2018 Cancer Staging   Staging form: Breast, AJCC 8th Edition - Pathologic stage from 04/03/2018: No Stage Recommended (ypT3, pN2a, cM0, G3, ER+, PR-, HER2-) - Signed by Malachy Mood, MD on 05/03/2018   10/19/2018 -  Anti-estrogen oral therapy   Adjuvant letrozole 2.5mg  daily     Survivorship   Per Santiago Glad, NP    05/07/2023 Imaging   Nuclear medicine whole-body bone scan FINDINGS: new multifocal radiotracer activity within the thoracic spine and upper lumbar spine. Broad lesion within the RIGHT sacrum. Focal lesion in the LEFT scapula and posterior LEFT and RIGHT ribs. Multifocal lesions within the calvarium. Probable metastatic lesions within the LEFT and RIGHT femurs. Lesion the proximal RIGHT humerus as well  as the humeral heads.   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   Malignant neoplasm of left breast in female, estrogen receptor positive (HCC)  09/09/2017 Breast US   Targeted left breast ultrasound is performed, showing an oval parallel hypoechoic mass containing microcalcifications at the 2 o'clock position approximately 6 cm from nipple measuring approximately  0.9 x 0.5 x 0.9 cm, corresponding to the mammographic finding.   Sonographic evaluation of the left axilla demonstrates no pathologic lymphadenopathy.   09/23/2017 Initial Biopsy   Diagnosis 1. Breast, left, needle core biopsy, 2 o'clock - INVASIVE DUCTAL CARCINOMA. - SEE COMMENT. 2. Breast, right, needle core biopsy, 11:30 o'clock - INVASIVE MAMMARY CARCINOMA. - SEE COMMENT. 3. Lymph node, needle/core biopsy, right axilla - METASTATIC MAMMARY CARCINOMA IN 1 OF 1 LYMPH NODE (1/1). Microscopic Comment 1. There is a small focus of grade I invasive ductal carcinoma. A complete breast prognostic profile will be attempted and the results reported separately. The results were called to the [facility] on 09/24/2017. 2. The carcinoma appears grade II. An E-Cadherin stain and a breast prognostic profile will be performed on part 2 and the results reported separately. (JBK:kh 09-24-17) [name]  1. HER2 Negative by FISH Estrogen receptor: 100% positive, strong staining Progesterone receptor: 80% positive, strong staining Ki67: 5%  2. HER2 Negative by FISH Estrogen receptor: 100% positive, strong staining Progesterone receptor: 0%, negative  Ki67: 30%   10/08/2017 Initial Diagnosis   Malignant neoplasm of left breast in female, estrogen receptor positive (HCC)   12/16/2017 Breast US   IMPRESSION: 3. Stable appearance of a left breast mass, consistent with biopsy proven malignancy.     01/22/2018 Imaging   01/22/2018 MRI Bilateral Breast IMPRESSION: 1. Dominant enhancing mass with surrounding nodularity involving the central anterior third of the right breast now measures up to 6.4 x 6.7 x 7.6 cm (previously measured 6.8 x 9.6 x 7.2 cm). Persistent bulky right axillary lymphadenopathy, slightly decreased in size since prior MRI.   2. Biopsy proven left breast malignancy now measures 0.5 cm (previously measured 0.9 x 0.8 x 0.7 cm).    04/03/2018 Pathology Results    Diagnosis 1. Breast, lumpectomy, left with radioactive seed - RESIDUAL INVASIVE DUCTAL CARCINOMA STATUS POST NEOADJUVANT THERAPY (0.9 CM) - MARGINS UNINVOLVED BY CARCINOMA (0.2 CM; INFERIOR MARGIN) - CALCIFICATIONS ASSOCIATED WITH CARCINOMA - PREVIOUS BIOPSY SITE CHANGES PRESENT - SEE ONCOLOGY TABLE AND COMMENT BELOW 2. Lymph node, sentinel, biopsy, left axillary #1 - NO CARCINOMA IDENTIFIED IN ONE LYMPH NODE (0/1) - SEE COMMENT 3. Lymph node, sentinel, biopsy, left axillary #2 - NO CARCINOMA IDENTIFIED IN ONE LYMPH NODE (0/1) - SEE COMMENT 4. Lymph node, sentinel, biopsy, left - NO CARCINOMA IDENTIFIED IN ONE LYMPH NODE (0/1) - SEE COMMENT 5. Lymph node, sentinel, biopsy, left axillary #3 - NO CARCINOMA IDENTIFIED IN ONE LYMPH NODE (0/1) - SEE COMMENT 6. Breast, modified radical mastectomy , right - RESIDUAL INVASIVE DUCTAL CARCINOMA STATUS POST NEOADJUVANT THERAPY (8.5 CM) - EXTENSIVE LYMPHOVASCULAR SPACE INVASION PRESENT - METASTATIC CARCINOMA INVOLVING FIVE OF FIFTEEN LYMPH NODES WITH EXTRACAPSULAR EXTENSION (5/15) - MARGINS UNINVOLVED BY CARCINOMA - SEE ONCOLOGY TABLE AND COMMENT BELOW   04/03/2018 Receptors her2   The tumor cells are NEGATIVE for Her2 (1+). Estrogen Receptor: 95%, POSITIVE, STRONG STAINING INTENSITY Progesterone Receptor: 95%, POSITIVE, STRONG STAINING INTENSITY  The tumor cells are NEGATIVE for Her2 (1+). Estrogen Receptor: 100%, POSITIVE, STRONG STAINING INTENSITY Progesterone Receptor: 0%, NEGATIVE  04/03/2018 Cancer Staging   Staging form: Breast, AJCC 8th Edition - Pathologic stage from 04/03/2018: No Stage Recommended (ypT1b, pN0, cM0, G1, ER+, PR+, HER2-) - Signed by Malachy Mood, MD on 05/03/2018   04/03/2018 Surgery   Left LEFT BREAST LUMPECTOMY WITH RADIOACTIVE SEED AND LEFT SENTINEL LYMPH NODE BIOPSY with Claud Kelp, MD    05/20/2018 - 07/08/2018 Radiation Therapy   Daily adjuvant radiation treatment with Dr.Moody.    Radiation  treatment dates:   05/20/2018 - 07/08/2018   Site/dose:   The patient initially received a dose of 50.4 Gy in 28 fractions to the right chest wall and supraclavicular region. This was delivered using a 3-D conformal, 4 field technique. The patient then received a boost to the mastectomy scar. This delivered an additional 10 Gy in 5 fractions using an en face electron field. The total dose was 60.4 Gy. At the same time, the patient received a dose of 50.4 Gy in 28 fractions to the left breast using whole-breast tangent fields. This was delivered using a 3-D conformal technique. The patient then received a boost to the seroma. This delivered an additional 10 Gy in 5 fractions using 15E electrons with a special teletherapy technique. The total dose was 60.4 Gy.   10/19/2018 -  Anti-estrogen oral therapy   Letrozole 2.5mg  daily started 07/2018     Survivorship   Per Santiago Glad, NP    05/07/2023 Imaging   Nuclear medicine whole-body bone scan FINDINGS: new multifocal radiotracer activity within the thoracic spine and upper lumbar spine. Broad lesion within the RIGHT sacrum. Focal lesion in the LEFT scapula and posterior LEFT and RIGHT ribs. Multifocal lesions within the calvarium. Probable metastatic lesions within the LEFT and RIGHT femurs. Lesion the proximal RIGHT humerus as well as the humeral heads.   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     ALLERGIES:  is allergic to bee venom and tramadol.  MEDICATIONS:  Current Outpatient Medications  Medication Sig Dispense Refill   amLODipine (NORVASC) 5 MG tablet Take 1 tablet (5 mg total) by mouth daily. 90 tablet 1   b complex vitamins tablet Take 1 tablet by mouth daily. (Patient not taking: Reported on 05/12/2023)     benzonatate (TESSALON) 100 MG capsule Take 1 capsule (100 mg total) by mouth every 8 (eight) hours. (Patient not taking: Reported on 02/19/2023) 21 capsule 0   magnesium  chloride (SLOW-MAG) 64 MG TBEC SR tablet Take by mouth. (Patient not taking: Reported on 02/19/2023)     magnesium citrate SOLN Take 1 Bottle by mouth once. (Patient not taking: Reported on 02/19/2023)     magnesium gluconate (MAGONATE) 500 MG tablet Take 500 mg by mouth daily. (Patient not taking: Reported on 05/12/2023)     Nutritional Supplements (VITAMIN D BOOSTER PO) Take by mouth. (Patient not taking: Reported on 02/19/2023)     ondansetron (ZOFRAN) 4 MG tablet Take 1 tablet (4 mg total) by mouth every 6 (six) hours. (Patient not taking: Reported on 05/12/2023) 12 tablet 0   oxyCODONE (OXY IR/ROXICODONE) 5 MG immediate release tablet Take 1-2 tablets (5-10 mg total) by mouth 3 (three) times daily as needed for severe pain (pain score 7-10). 45 tablet 0   tamoxifen (NOLVADEX) 20 MG tablet Take 1 tablet (20 mg total) by mouth daily. (Patient not taking: Reported on 05/12/2023) 90 tablet 3   Ubiquinol (UBQH) 100 MG CAPS Take by mouth. (Patient not taking: Reported on 02/19/2023)  No current facility-administered medications for this visit.    VITAL SIGNS: There were no vitals taken for this visit. There were no vitals filed for this visit.  Estimated body mass index is 26.5 kg/m as calculated from the following:   Height as of 05/02/23: 5\' 4"  (1.626 m).   Weight as of 05/12/23: 154 lb 6.4 oz (70 kg).  LABS: CBC:    Component Value Date/Time   WBC 7.5 05/02/2023 1449   WBC 6.9 11/29/2022 0830   HGB 12.2 05/02/2023 1449   HGB 14.6 09/10/2017 1243   HCT 36.3 05/02/2023 1449   HCT 42.3 09/10/2017 1243   PLT 225 05/02/2023 1449   PLT 225 09/10/2017 1243   MCV 92.4 05/02/2023 1449   MCV 88 09/10/2017 1243   NEUTROABS 5.1 05/02/2023 1449   NEUTROABS 4.4 09/10/2017 1243   LYMPHSABS 1.7 05/02/2023 1449   LYMPHSABS 2.9 09/10/2017 1243   MONOABS 0.5 05/02/2023 1449   EOSABS 0.1 05/02/2023 1449   EOSABS 0.2 09/10/2017 1243   BASOSABS 0.0 05/02/2023 1449   BASOSABS 0.0 09/10/2017 1243    Comprehensive Metabolic Panel:    Component Value Date/Time   NA 138 05/02/2023 1449   NA 143 09/10/2017 1243   K 4.2 05/02/2023 1449   CL 103 05/02/2023 1449   CO2 28 05/02/2023 1449   BUN 21 05/02/2023 1449   BUN 25 09/10/2017 1243   CREATININE 0.79 05/02/2023 1449   GLUCOSE 98 05/02/2023 1449   CALCIUM 10.0 05/02/2023 1449   AST 15 05/02/2023 1449   ALT 10 05/02/2023 1449   ALKPHOS 86 05/02/2023 1449   BILITOT 0.3 05/02/2023 1449   PROT 6.9 05/02/2023 1449   PROT 6.5 09/10/2017 1243   ALBUMIN 3.9 05/02/2023 1449   ALBUMIN 4.8 09/10/2017 1243    RADIOGRAPHIC STUDIES: NM Bone Scan Whole Body  Result Date: 05/07/2023 CLINICAL DATA:  Breast cancer.  Evaluate for metastatic disease. EXAM: NUCLEAR MEDICINE WHOLE BODY BONE SCAN TECHNIQUE: Whole body anterior and posterior images were obtained approximately 3 hours after intravenous injection of radiopharmaceutical. RADIOPHARMACEUTICALS:  20.7 mCi Technetium-51m MDP IV COMPARISON:  Nuclear medicine bone scan 10/17/2017 FINDINGS: new multifocal radiotracer activity within the thoracic spine and upper lumbar spine. Broad lesion within the RIGHT sacrum. Focal lesion in the LEFT scapula and posterior LEFT and RIGHT ribs. Multifocal lesions within the calvarium. Probable metastatic lesions within the LEFT and RIGHT femurs. Lesion the proximal RIGHT humerus as well as the humeral heads. 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 Electronically Signed   By: Genevive Bi M.D.   On: 05/07/2023 14:26    PERFORMANCE STATUS (ECOG) : {CHL ONC ECOG ZO:[id number]}  Review of Systems Unless otherwise noted, a complete review of systems is negative.  Physical Exam General: NAD Cardiovascular: regular rate and rhythm Pulmonary: clear ant fields Abdomen: soft, nontender, + bowel sounds Extremities: no edema, no joint deformities Skin: no rashes Neurological: Alert and oriented  x3  IMPRESSION: *** I introduced myself, Luay Balding RN, and Palliative's role in collaboration with the oncology team. Concept of Palliative Care was introduced as specialized medical care for people and their families living with serious illness.  It focuses on providing relief from the symptoms and stress of a serious illness.  The goal is to improve quality of life for both the patient and the family. Values and goals of care important to patient and family were attempted to be elicited.    We discussed *** current illness and  what it means in the larger context of *** on-going co-morbidities. Natural disease trajectory and expectations were discussed.  I discussed the importance of continued conversation with family and their medical providers regarding overall plan of care and treatment options, ensuring decisions are within the context of the patients values and GOCs.  PLAN: Established therapeutic relationship. Education provided on palliative's role in collaboration with their Oncology/Radiation team. I will plan to see patient back in 2-4 weeks in collaboration to other oncology appointments.    Patient expressed understanding and was in agreement with this plan. She also understands that She can call the clinic at any time with any questions, concerns, or complaints.   Thank you for your referral and allowing Palliative to assist in Darlene Beasley's care.   Number and complexity of problems addressed: ***HIGH - 1 or more chronic illnesses with SEVERE exacerbation, progression, or side effects of treatment - advanced cancer, pain. Any controlled substances utilized were prescribed in the context of palliative care.   Visit consisted of counseling and education dealing with the complex and emotionally intense issues of symptom management and palliative care in the setting of serious and potentially life-threatening illness.  Signed by: Willette Alma, AGPCNP-BC Palliative  Medicine Team/[facility]   *Please note that this is a verbal dictation therefore any spelling or grammatical errors are due to the "[employer]agon [employer] One" system interpretation.

## 2023-05-21 NOTE — Telephone Encounter (Signed)
Diane from [facility] call to give report on pt's recent CT CAP w/contrast.  Notified Dr. Mosetta Putt and Vincent Gros, DNP of the results.

## 2023-05-22 NOTE — Progress Notes (Signed)
[facility]   [phone] [phone]   Clinic Follow up Note   Patient Care Team: Estevan Oaks, NP as PCP - General (Nurse Practitioner) Claud Kelp, MD as Consulting Physician (General Surgery) Malachy Mood, MD as Consulting Physician (Hematology) Dorothy Puffer, MD as Consulting Physician (Radiation Oncology) Pollyann Samples, NP as Nurse Practitioner (Nurse Practitioner) 05/21/2023  I connected with Darlene Beasley on 05/23/23 at  4:10 PM EST by telephone and verified that I am speaking with the correct person using two identifiers.   I discussed the limitations, risks, security and privacy concerns of performing an evaluation and management service by telephone and the availability of in person appointments. I also discussed with the patient that there may be a patient responsible charge related to this service. The patient expressed understanding and agreed to proceed.   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  -She is s/p neoadjuvant chemo with AC-T, left lumpectomy and right mastectomy. She had a very limited response to neoadjuvant chemotherapy. She also completed adjuvant radiation.  -She started antiestrogen therapy with Letrozole in 10/2018. Plan for 7 years.  She tolerated well.  Due to her osteoporosis, I changed letrozole to tamoxifen in September 2023. -She unfortunately developed a debilitating pain, especially in the left hip area, to the point not able to walk in dependently.  Bone scan in November 2024 and CT scanning in early December 2024 showed diffuse bone metastasis -I  personally reviewed her CT scan from May 16, 2023, which showed a diffuse bone metastasis.  There is a 5.0 cm bone mets in the L1 with significant epidural extension, and extensive left pelvic bone metastasis, likely the cause of debility left hip pain.  I recommend urgent palliative radiation for pain control.  She agrees referral made today -She is scheduled for CT-guided bone biopsy on June 03, 2023 -Plan to change her treatment to letrozole and Verzenio if biopsy confirms ER/PR positive and HER2 negative disease.  Plan -urgent referral to Dr. Mitzi Hansen for palliative radiation -CT biopsy scheduled for 06/03/2023 -Follow-up the first week of January to review biopsy results and change treatment   -She is scheduled to see palliative care NP Lowella Bandy on December 16           SUMMARY OF ONCOLOGIC HISTORY: Oncology History Overview Note  Cancer Staging Cancer of overlapping sites of right female breast [facility]) Staging form: Breast, AJCC 8th Edition - Clinical stage from 09/23/2017: Stage IIIA (cT3, cN1, cM0, G2, ER+, PR-, HER2-) - Signed by Malachy Mood, MD on 10/09/2017 - Pathologic stage from 04/03/2018: No Stage Recommended (ypT3, pN2a, cM0, G3, ER+, PR-, HER2-) - Signed by Malachy Mood, MD on 05/03/2018  Malignant neoplasm of left breast in female, estrogen receptor positive (HCC) Staging form: Breast, AJCC 8th Edition - Clinical: Stage IA (cT1b, cN0, cM0, G1, ER+, PR+, HER2-) - Unsigned - Pathologic stage from 04/03/2018: No Stage Recommended (ypT1b, pN0, cM0, G1, ER+, PR+, HER2-) - Signed by Malachy Mood, MD on 05/03/2018     Cancer of overlapping sites of right female breast (HCC)  09/09/2017 Mammogram   IMPRESSION: 1. Highly suspicious large right  breast mass involving the outer and upper breast extending from approximately 9 o'clock through 12-1 o'clock, maximum measurement approximating 7.5 cm. The mass involves the right nipple, accounting for nipple retraction. Architectural distortion,  microcalcifications and diffuse trabecular thickening and skin thickening are associated with the large mass, raising the possibility of this representing an inflammatory cancer. 2. Satellite masses separate from the dominant mass involving the lower outer quadrant and the upper inner quadrant of the right breast, measured above. 3. Two adjacent pathologic right axillary lymph nodes. 4. Indeterminate 0.9 cm solid mass involving the upper outer quadrant of the left breast which accounts for a mammographic finding. 5. No pathologic left axillary lymphadenopathy.   09/09/2017 Breast US   Targeted right breast ultrasound is performed, showing a very large hypoechoic mass with irregular margins extending from the approximate 9 o'clock position through the 12 to 1 o'clock position. On the CC gait image, the mass measures maximally approximately 7.5 cm. The mass has irregular margins, demonstrates acoustic shadowing, and demonstrates internal power Doppler flow. The mass extends into the retroareolar region and involves the nipple, accounting for the nipple retraction.   Separate from the dominant mass at the 7 o'clock position approximately 4 cm from nipple is a hypoechoic antiparallel mass with irregular margins measuring approximately 1.4 x 2.7 x 1.7 cm.  Separate from the dominant mass at the 1 o'clock position approximately 2 cm from nipple is a hypoechoic mass with irregular margins measuring approximately 1.7 x 0.5 x 1.6 cm. Both of these masses also demonstrate internal power Doppler flow.   Sonographic evaluation of the right axilla demonstrates 2 adjacent pathologic lymph nodes, the larger measuring approximately 2.5 x 2.5 x 2.8 cm, the smaller measuring approximately 2.0 x 0.8 x 2.7 cm.   09/23/2017 Initial Biopsy   Diagnosis 1. Breast, left, needle core biopsy, 2 o'clock - INVASIVE DUCTAL CARCINOMA. - SEE COMMENT. 2. Breast, right, needle core biopsy, 11:30 o'clock - INVASIVE MAMMARY  CARCINOMA. - SEE COMMENT. 3. Lymph node, needle/core biopsy, right axilla - METASTATIC MAMMARY CARCINOMA IN 1 OF 1 LYMPH NODE (1/1). Microscopic Comment 1. There is a small focus of grade I invasive ductal carcinoma. A complete breast prognostic profile will be attempted and the results reported separately. The results were called to the [facility] on 09/24/2017. 2. The carcinoma appears grade II. An E-Cadherin stain and a breast prognostic profile will be performed on part 2 and the results reported separately. (JBK:kh 09-24-17) [name]  1. HER2 Negative by FISH Estrogen receptor: 100% positive, strong staining Progesterone receptor: 80% positive, strong staining Ki67: 5%  2. HER2 Negative by FISH Estrogen receptor: 100% positive, strong staining Progesterone receptor: 0%, negative  Ki67: 30% 2. The tumor cells are strongly positive for E-cadherin, supporting a ductal phenotype   09/23/2017 Cancer Staging   Staging form: Breast, AJCC 8th Edition - Clinical stage from 09/23/2017: Stage IIIA (cT3, cN1, cM0, G2, ER+, PR-, HER2-) - Signed by Malachy Mood, MD on 10/09/2017   10/07/2017 Breast MRI   IMPRESSION: 1. Biopsy proven malignancy involving all 4 quadrants of the right breast, although predominantly located in the anterior third of the central upper right breast measures 6.8 x 9.6 x 7.2 cm.   2. Five pathologic right axillary lymph nodes, compatible with biopsy proven axillary metastases.   3. Small mass with associated biopsy related changes in the upper-outer left breast at site of known malignancy measures 0.9 x 0.8 x 0.7 cm. No abnormal lymph nodes  seen in the left axilla.   RECOMMENDATION: Treatment plan for known bilateral breast malignancy.   BI-RADS CATEGORY  6: Known biopsy-proven malignancy.   10/08/2017 Initial Diagnosis   Cancer of overlapping sites of right female breast (HCC)   10/13/2017 Pathology Results   Diagnosis 1. Breast, right, needle core  biopsy, 1 o'clock - INVASIVE DUCTAL CARCINOMA. - SEE COMMENT. 2. Breast, right, needle core biopsy, 7 o'clock - INVASIVE DUCTAL CARCINOMA. - SEE COMMENT. Microscopic Comment 1. and 2. The carcinoma in parts 1 and 2 is morphologically similar and appears grade II. Because breast prognostic profiles were performed on the patients previous case, [id number], it will not be repeated on the current case unless requested.    10/17/2017 Imaging   CT CAP IMPRESSION: 1. Infiltrative right breast mass with overlying skin thickening is identified compatible with known breast cancer. 2. Enlarged right axillary lymph nodes compatible with metastatic adenopathy. 3. No additional sites of disease identified within the chest and no evidence for metastasis to the abdomen or pelvis. 4. Aortic atherosclerosis and 3 vessel coronary artery atherosclerotic calcifications. Aortic Atherosclerosis (ICD10-I70.0).   10/17/2017 Imaging   Bone Scan FINDINGS: There are no foci of increased or decreased radiotracer uptake to suggest osseous metastatic disease. 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 There is degenerative type uptake within both shoulders and the left ankle.   Normal physiologic activity is identified within the kidneys and urinary bladder.   IMPRESSION: No evidence of osseous metastatic disease.   10/22/2017 -  Chemotherapy   AC q2 weeks x4 cycles starting 10/22/17-12/03/17, followed by weekly taxol x12 12/17/17-03/11/18   11/14/2017 Genetic Testing   The genes that were analyzed were the 83 genes on [facility]'s Multi-Cancer panel (ALK, APC, ATM, AXIN2, BAP1, BARD1, BLM, BMPR1A, BRCA1, BRCA2, BRIP1, CASR, CDC73, CDH1, CDK4, CDKN1B, CDKN1C, CDKN2A, CEBPA, CHEK2, CTNNA1, DICER1, DIS3L2, EGFR, EPCAM, FH, FLCN, GATA2, GPC3, GREM1, HOXB13, HRAS, KIT, MAX, MEN1, MET, MITF, MLH1, MSH2, MSH3, MSH6, MUTYH, NBN, NF1,  NF2, NTHL1, PALB2, PDGFRA, PHOX2B, PMS2, POLD1, POLE, POT1, PRKAR1A, PTCH1, PTEN, RAD50, RAD51C, RAD51D, RB1, RECQL4, RET, RUNX1, SDHA, SDHAF2, SDHB, SDHC, SDHD, SMAD4, SMARCA4, SMARCB1, SMARCE1, STK11, SUFU, TERC, TERT, TMEM127, TP53, TSC1, TSC2, VHL, WRN, WT1).  Testing revealed a pathogenic mutation in the MITF gene called c.952G>A (p.Glu318Lys) and A Variant of Unknown Significance (VUS) was also detected: POLD1 c.301A>T (p.Ile101Phe).   12/16/2017 Breast US   IMPRESSION: 1. Stable to minimal decrease in size of multiple irregular masses throughout the right breast consistent with the patient's sites of biopsy proven malignancy. While the mammographic appearance is slightly less dense in this region, there is increased skin and trabecular thickening involving the entire right breast. 2. Stable appearance of morphologically abnormal right axillary lymph nodes, consistent with biopsy proven metastatic disease. 3. Stable appearance of a left breast mass, consistent with biopsy proven malignancy.     04/03/2018 Cancer Staging   Staging form: Breast, AJCC 8th Edition - Pathologic stage from 04/03/2018: No Stage Recommended (ypT3, pN2a, cM0, G3, ER+, PR-, HER2-) - Signed by Malachy Mood, MD on 05/03/2018   10/19/2018 -  Anti-estrogen oral therapy   Adjuvant letrozole 2.5mg  daily     Survivorship   Per Santiago Glad, NP    05/07/2023 Imaging   Nuclear medicine whole-body bone scan FINDINGS: new multifocal radiotracer activity within the thoracic spine and upper lumbar spine. Broad lesion within the RIGHT sacrum. Focal lesion  in the LEFT scapula and posterior LEFT and RIGHT ribs. Multifocal lesions within the calvarium. Probable metastatic lesions within the LEFT and RIGHT femurs. Lesion the proximal RIGHT humerus as well as the humeral heads.   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   Malignant neoplasm of left breast in female, estrogen  receptor positive (HCC)  09/09/2017 Breast US   Targeted left breast ultrasound is performed, showing an oval parallel hypoechoic mass containing microcalcifications at the 2 o'clock position approximately 6 cm from nipple measuring approximately 0.9 x 0.5 x 0.9 cm, corresponding to the mammographic finding.   Sonographic evaluation of the left axilla demonstrates no pathologic lymphadenopathy.   09/23/2017 Initial Biopsy   Diagnosis 1. Breast, left, needle core biopsy, 2 o'clock - INVASIVE DUCTAL CARCINOMA. - SEE COMMENT. 2. Breast, right, needle core biopsy, 11:30 o'clock - INVASIVE MAMMARY CARCINOMA. - SEE COMMENT. 3. Lymph node, needle/core biopsy, right axilla - METASTATIC MAMMARY CARCINOMA IN 1 OF 1 LYMPH NODE (1/1). Microscopic Comment 1. There is a small focus of grade I invasive ductal carcinoma. A complete breast prognostic profile will be attempted and the results reported separately. The results were called to the [facility] on 09/24/2017. 2. The carcinoma appears grade II. An E-Cadherin stain and a breast prognostic profile will be performed on part 2 and the results reported separately. (JBK:kh 09-24-17) [name]  1. HER2 Negative by FISH Estrogen receptor: 100% positive, strong staining Progesterone receptor: 80% positive, strong staining Ki67: 5%  2. HER2 Negative by FISH Estrogen receptor: 100% positive, strong staining Progesterone receptor: 0%, negative  Ki67: 30%   10/08/2017 Initial Diagnosis   Malignant neoplasm of left breast in female, estrogen receptor positive (HCC)   12/16/2017 Breast US   IMPRESSION: 3. Stable appearance of a left breast mass, consistent with biopsy proven malignancy.     01/22/2018 Imaging   01/22/2018 MRI Bilateral Breast IMPRESSION: 1. Dominant enhancing mass with surrounding nodularity involving the central anterior third of the right breast now measures up to 6.4 x 6.7 x 7.6 cm (previously measured 6.8 x 9.6 x 7.2  cm). Persistent bulky right axillary lymphadenopathy, slightly decreased in size since prior MRI.   2. Biopsy proven left breast malignancy now measures 0.5 cm (previously measured 0.9 x 0.8 x 0.7 cm).    04/03/2018 Pathology Results   Diagnosis 1. Breast, lumpectomy, left with radioactive seed - RESIDUAL INVASIVE DUCTAL CARCINOMA STATUS POST NEOADJUVANT THERAPY (0.9 CM) - MARGINS UNINVOLVED BY CARCINOMA (0.2 CM; INFERIOR MARGIN) - CALCIFICATIONS ASSOCIATED WITH CARCINOMA - PREVIOUS BIOPSY SITE CHANGES PRESENT - SEE ONCOLOGY TABLE AND COMMENT BELOW 2. Lymph node, sentinel, biopsy, left axillary #1 - NO CARCINOMA IDENTIFIED IN ONE LYMPH NODE (0/1) - SEE COMMENT 3. Lymph node, sentinel, biopsy, left axillary #2 - NO CARCINOMA IDENTIFIED IN ONE LYMPH NODE (0/1) - SEE COMMENT 4. Lymph node, sentinel, biopsy, left - NO CARCINOMA IDENTIFIED IN ONE LYMPH NODE (0/1) - SEE COMMENT 5. Lymph node, sentinel, biopsy, left axillary #3 - NO CARCINOMA IDENTIFIED IN ONE LYMPH NODE (0/1) - SEE COMMENT 6. Breast, modified radical mastectomy , right - RESIDUAL INVASIVE DUCTAL CARCINOMA STATUS POST NEOADJUVANT THERAPY (8.5 CM) - EXTENSIVE LYMPHOVASCULAR SPACE INVASION PRESENT - METASTATIC CARCINOMA INVOLVING FIVE OF FIFTEEN LYMPH NODES WITH EXTRACAPSULAR EXTENSION (5/15) - MARGINS UNINVOLVED BY CARCINOMA - SEE ONCOLOGY TABLE AND COMMENT BELOW   04/03/2018 Receptors her2   The tumor cells are NEGATIVE for Her2 (1+). Estrogen Receptor: 95%, POSITIVE, STRONG  STAINING INTENSITY Progesterone Receptor: 95%, POSITIVE, STRONG STAINING INTENSITY  The tumor cells are NEGATIVE for Her2 (1+). Estrogen Receptor: 100%, POSITIVE, STRONG STAINING INTENSITY Progesterone Receptor: 0%, NEGATIVE   04/03/2018 Cancer Staging   Staging form: Breast, AJCC 8th Edition - Pathologic stage from 04/03/2018: No Stage Recommended (ypT1b, pN0, cM0, G1, ER+, PR+, HER2-) - Signed by Malachy Mood, MD on 05/03/2018   04/03/2018  Surgery   Left LEFT BREAST LUMPECTOMY WITH RADIOACTIVE SEED AND LEFT SENTINEL LYMPH NODE BIOPSY with Claud Kelp, MD    05/20/2018 - 07/08/2018 Radiation Therapy   Daily adjuvant radiation treatment with Dr.Moody.    Radiation treatment dates:   05/20/2018 - 07/08/2018   Site/dose:   The patient initially received a dose of 50.4 Gy in 28 fractions to the right chest wall and supraclavicular region. This was delivered using a 3-D conformal, 4 field technique. The patient then received a boost to the mastectomy scar. This delivered an additional 10 Gy in 5 fractions using an en face electron field. The total dose was 60.4 Gy. At the same time, the patient received a dose of 50.4 Gy in 28 fractions to the left breast using whole-breast tangent fields. This was delivered using a 3-D conformal technique. The patient then received a boost to the seroma. This delivered an additional 10 Gy in 5 fractions using 15E electrons with a special teletherapy technique. The total dose was 60.4 Gy.   10/19/2018 -  Anti-estrogen oral therapy   Letrozole 2.5mg  daily started 07/2018     Survivorship   Per Santiago Glad, NP    05/07/2023 Imaging   Nuclear medicine whole-body bone scan FINDINGS: new multifocal radiotracer activity within the thoracic spine and upper lumbar spine. Broad lesion within the RIGHT sacrum. Focal lesion in the LEFT scapula and posterior LEFT and RIGHT ribs. Multifocal lesions within the calvarium. Probable metastatic lesions within the LEFT and RIGHT femurs. Lesion the proximal RIGHT humerus as well as the humeral heads.   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     Discussed the use of AI scribe software for clinical note transcription with the patient, who gave verbal consent to proceed.  History of Present Illness    Patient is scheduled for phone visit to review her CT scan results.  She still has debilitating diffuse  pain, especially in the left pelvic and hip area.  She is only able to walk a short distance with a walker.  She is on oxycodone which controls her pain moderately.  No other new complaints.     REVIEW OF SYSTEMS:   Constitutional: Denies fevers, chills or abnormal weight loss, (+) generalized weakness Eyes: Denies blurriness of vision Ears, nose, mouth, throat, and face: Denies mucositis or sore throat Respiratory: Denies cough, dyspnea or wheezes Cardiovascular: Denies palpitation, chest discomfort or lower extremity swelling Gastrointestinal:  Denies nausea, heartburn or change in bowel habits Skin: Denies abnormal skin rashes Lymphatics: Denies new lymphadenopathy or easy bruising Neurological:Denies numbness, tingling or new weaknesses, (+) diffuse body pain Behavioral/Psych: Mood is stable, no new changes  All other systems were reviewed with the patient and are negative.  MEDICAL HISTORY:  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   GERD (gastroesophageal reflux disease)    Monoallelic mutation of MITF gene  11/14/2017   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)   Personal history of chemotherapy    Personal history of radiation therapy     SURGICAL HISTORY: Past Surgical History:  Procedure Laterality Date   BREAST LUMPECTOMY Left 04/03/2018   BREAST LUMPECTOMY WITH RADIOACTIVE SEED AND SENTINEL LYMPH NODE BIOPSY Left 04/03/2018   Procedure: LEFT BREAST LUMPECTOMY WITH RADIOACTIVE SEED AND LEFT SENTINEL LYMPH NODE BIOPSY;  Surgeon: Claud Kelp, MD;  Location: [facility];  Service: General;  Laterality: Left;   IR IMAGING GUIDED PORT INSERTION  10/20/2017   IR US GUIDE VASC ACCESS LEFT  10/20/2017   MASTECTOMY Right 04/03/2018   MASTECTOMY MODIFIED RADICAL Right 04/03/2018   Procedure:  MASTECTOMY MODIFIED RADICAL;  Surgeon: Claud Kelp, MD;  Location: [facility];  Service: General;  Laterality: Right;   PORT-A-CATH REMOVAL N/A 04/03/2018   Procedure: REMOVAL PORT-A-CATH;  Surgeon: Claud Kelp, MD;  Location: [facility];  Service: General;  Laterality: N/A;   TONSILLECTOMY Bilateral    TUBAL LIGATION      I have reviewed the social history and family history with the patient and they are unchanged from previous note.  ALLERGIES:  is allergic to bee venom and tramadol.  MEDICATIONS:  Current Outpatient Medications  Medication Sig Dispense Refill   amLODipine (NORVASC) 5 MG tablet Take 1 tablet (5 mg total) by mouth daily. 90 tablet 1   b complex vitamins tablet Take 1 tablet by mouth daily. (Patient not taking: Reported on 05/12/2023)     benzonatate (TESSALON) 100 MG capsule Take 1 capsule (100 mg total) by mouth every 8 (eight) hours. (Patient not taking: Reported on 02/19/2023) 21 capsule 0   magnesium chloride (SLOW-MAG) 64 MG TBEC SR tablet Take by mouth. (Patient not taking: Reported on 02/19/2023)     magnesium citrate SOLN Take 1 Bottle by mouth once. (Patient not taking: Reported on 02/19/2023)     magnesium gluconate (MAGONATE) 500 MG tablet Take 500 mg by mouth daily. (Patient not taking: Reported on 05/12/2023)     Nutritional Supplements (VITAMIN D BOOSTER PO) Take by mouth. (Patient not taking: Reported on 02/19/2023)     ondansetron (ZOFRAN) 4 MG tablet Take 1 tablet (4 mg total) by mouth every 6 (six) hours. (Patient not taking: Reported on 05/12/2023) 12 tablet 0   oxyCODONE (OXY IR/ROXICODONE) 5 MG immediate release tablet Take 1-2 tablets (5-10 mg total) by mouth 3 (three) times daily as needed for severe pain (pain score 7-10). 36 tablet 0   tamoxifen (NOLVADEX) 20 MG tablet Take 1 tablet (20 mg total) by mouth daily. (Patient not taking: Reported on 05/12/2023) 90 tablet 3   Ubiquinol (UBQH) 100 MG CAPS Take by mouth.  (Patient not taking: Reported on 02/19/2023)     No current facility-administered medications for this visit.    PHYSICAL EXAMINATION: Not performed   LABORATORY DATA:  I have reviewed the data as listed    Latest Ref Rng & Units 05/02/2023    2:49 PM 02/19/2023   11:14 AM 11/29/2022    8:30 AM  CBC  WBC 4.0 - 10.5 K/uL 7.5  5.1  6.9   Hemoglobin 12.0 - 15.0 g/dL 78.2  95.6  21.3   Hematocrit 36.0 - 46.0 % 36.3  38.7  45.7   Platelets 150 - 400 K/uL 225  205  243         Latest Ref Rng & Units 05/02/2023    2:49 PM 02/19/2023  11:14 AM 11/29/2022    8:30 AM  CMP  Glucose 70 - 99 mg/dL 98  98  295   BUN 8 - 23 mg/dL 21  21  17    Creatinine 0.44 - 1.00 mg/dL 6.21  3.08  6.57   Sodium 135 - 145 mmol/L 138  142  134   Potassium 3.5 - 5.1 mmol/L 4.2  4.3  3.5   Chloride 98 - 111 mmol/L 103  108  97   CO2 22 - 32 mmol/L 28  28  25    Calcium 8.9 - 10.3 mg/dL 84.6  9.5  9.0   Total Protein 6.5 - 8.1 g/dL 6.9  6.6    Total Bilirubin <1.2 mg/dL 0.3  0.4    Alkaline Phos 38 - 126 U/L 86  64    AST 15 - 41 U/L 15  13    ALT 0 - 44 U/L 10  9        RADIOGRAPHIC STUDIES: I have personally reviewed the radiological images as listed and agreed with the findings in the report. No results found.     I discussed the assessment and treatment plan with the patient. The patient was provided an opportunity to ask questions and all were answered. The patient agreed with the plan and demonstrated an understanding of the instructions.   The patient was advised to call back or seek an in-person evaluation if the symptoms worsen or if the condition fails to improve as anticipated.  I provided 22 minutes of non face-to-face telephone visit time during this encounter, and > 50% was spent counseling as documented under my assessment & plan.     Malachy Mood, MD 05/21/2023

## 2023-05-23 ENCOUNTER — Encounter: Payer: Self-pay | Admitting: Hematology

## 2023-05-23 NOTE — Progress Notes (Unsigned)
Palliative Medicine [facility]  [phone] [phone]   Name: Darlene Beasley Date: 05/23/2023 MRN: [id number]  DOB: [birth year]  Patient Care Team: Estevan Oaks, NP as PCP - General (Nurse Practitioner) Claud Kelp, MD as Consulting Physician (General Surgery) Malachy Mood, MD as Consulting Physician (Hematology) Dorothy Puffer, MD as Consulting Physician (Radiation Oncology) Pollyann Samples, NP as Nurse Practitioner (Nurse Practitioner)    REASON FOR CONSULTATION: Darlene Beasley is a 67 y.o. 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 Palliative ask to see for symptom management and goals of care.    SOCIAL HISTORY:     reports that she has been smoking cigarettes. She has a 25.5 pack-year smoking history. She has never used smokeless tobacco. She reports current alcohol use of about 1.0 standard drink of alcohol per week. She reports current drug use. Drug: Marijuana.  ADVANCE DIRECTIVES:  None on file  CODE STATUS: Full code  PAST MEDICAL HISTORY: Past Medical History:  Diagnosis Date  . Adopted   . Arthritis   . breast ca dx'd 08/2017  . Genetic testing 11/14/2017   Multi-Cancer panel (83 genes) @ [facility] - Pathogenic mutation in the MITF gene  . GERD (gastroesophageal reflux disease)   . 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 Personal history of chemotherapy   . Personal history of radiation therapy     PAST SURGICAL HISTORY:  Past Surgical History:  Procedure Laterality Date  . BREAST LUMPECTOMY Left 04/03/2018  . BREAST LUMPECTOMY WITH RADIOACTIVE SEED AND SENTINEL LYMPH NODE BIOPSY Left 04/03/2018   Procedure: LEFT BREAST LUMPECTOMY WITH RADIOACTIVE SEED AND LEFT SENTINEL LYMPH NODE BIOPSY;  Surgeon: Claud Kelp, MD;  Location: [facility];  Service:  General;  Laterality: Left;  . IR IMAGING GUIDED PORT INSERTION  10/20/2017  . IR US GUIDE VASC ACCESS LEFT  10/20/2017  . MASTECTOMY Right 04/03/2018  . MASTECTOMY MODIFIED RADICAL Right 04/03/2018   Procedure: MASTECTOMY MODIFIED RADICAL;  Surgeon: Claud Kelp, MD;  Location: [facility];  Service: General;  Laterality: Right;  . PORT-A-CATH REMOVAL N/A 04/03/2018   Procedure: REMOVAL PORT-A-CATH;  Surgeon: Claud Kelp, MD;  Location:  SURGERY CENTER;  Service: General;  Laterality: N/A;  . TONSILLECTOMY Bilateral   . TUBAL LIGATION      HEMATOLOGY/ONCOLOGY HISTORY:  Oncology History Overview Note  Cancer Staging Cancer of overlapping sites of right female breast [facility]) Staging form: Breast, AJCC 8th Edition - Clinical stage from 09/23/2017: Stage IIIA (cT3, cN1, cM0, G2, ER+, PR-, HER2-) - Signed by Malachy Mood, MD on 10/09/2017 - Pathologic stage from 04/03/2018: No Stage Recommended (ypT3, pN2a, cM0, G3, ER+, PR-, HER2-) - Signed by Malachy Mood, MD on 05/03/2018  Malignant neoplasm of left breast in female, estrogen receptor positive (HCC) Staging form: Breast, AJCC 8th Edition - Clinical: Stage IA (cT1b, cN0, cM0, G1, ER+, PR+, HER2-) - Unsigned - Pathologic stage from 04/03/2018: No Stage Recommended (ypT1b, pN0, cM0, G1, ER+, PR+, HER2-) - Signed by Malachy Mood, MD on 05/03/2018     Cancer of overlapping sites of right female breast (HCC)  09/09/2017 Mammogram   IMPRESSION: 1. Highly suspicious large right breast mass involving the outer and upper breast extending from approximately 9 o'clock through 12-1 o'clock, maximum measurement approximating 7.5 cm. The mass involves the right nipple, accounting for nipple retraction.  Architectural distortion, microcalcifications and diffuse trabecular thickening and skin thickening are associated with the large mass, raising the possibility of this representing an inflammatory cancer. 2. Satellite masses separate from  the dominant mass involving the lower outer quadrant and the upper inner quadrant of the right breast, measured above. 3. Two adjacent pathologic right axillary lymph nodes. 4. Indeterminate 0.9 cm solid mass involving the upper outer quadrant of the left breast which accounts for a mammographic finding. 5. No pathologic left axillary lymphadenopathy.   09/09/2017 Breast US   Targeted right breast ultrasound is performed, showing a very large hypoechoic mass with irregular margins extending from the approximate 9 o'clock position through the 12 to 1 o'clock position. On the CC gait image, the mass measures maximally approximately 7.5 cm. The mass has irregular margins, demonstrates acoustic shadowing, and demonstrates internal power Doppler flow. The mass extends into the retroareolar region and involves the nipple, accounting for the nipple retraction.   Separate from the dominant mass at the 7 o'clock position approximately 4 cm from nipple is a hypoechoic antiparallel mass with irregular margins measuring approximately 1.4 x 2.7 x 1.7 cm.  Separate from the dominant mass at the 1 o'clock position approximately 2 cm from nipple is a hypoechoic mass with irregular margins measuring approximately 1.7 x 0.5 x 1.6 cm. Both of these masses also demonstrate internal power Doppler flow.   Sonographic evaluation of the right axilla demonstrates 2 adjacent pathologic lymph nodes, the larger measuring approximately 2.5 x 2.5 x 2.8 cm, the smaller measuring approximately 2.0 x 0.8 x 2.7 cm.   09/23/2017 Initial Biopsy   Diagnosis 1. Breast, left, needle core biopsy, 2 o'clock - INVASIVE DUCTAL CARCINOMA. - SEE COMMENT. 2. Breast, right, needle core biopsy, 11:30 o'clock - INVASIVE MAMMARY CARCINOMA. - SEE COMMENT. 3. Lymph node, needle/core biopsy, right axilla - METASTATIC MAMMARY CARCINOMA IN 1 OF 1 LYMPH NODE (1/1). Microscopic Comment 1. There is a small focus of grade I invasive ductal carcinoma. A  complete breast prognostic profile will be attempted and the results reported separately. The results were called to the [facility] on 09/24/2017. 2. The carcinoma appears grade II. An E-Cadherin stain and a breast prognostic profile will be performed on part 2 and the results reported separately. (JBK:kh 09-24-17) [name]  1. HER2 Negative by FISH Estrogen receptor: 100% positive, strong staining Progesterone receptor: 80% positive, strong staining Ki67: 5%  2. HER2 Negative by FISH Estrogen receptor: 100% positive, strong staining Progesterone receptor: 0%, negative  Ki67: 30% 2. The tumor cells are strongly positive for E-cadherin, supporting a ductal phenotype   09/23/2017 Cancer Staging   Staging form: Breast, AJCC 8th Edition - Clinical stage from 09/23/2017: Stage IIIA (cT3, cN1, cM0, G2, ER+, PR-, HER2-) - Signed by Malachy Mood, MD on 10/09/2017   10/07/2017 Breast MRI   IMPRESSION: 1. Biopsy proven malignancy involving all 4 quadrants of the right breast, although predominantly located in the anterior third of the central upper right breast measures 6.8 x 9.6 x 7.2 cm.   2. Five pathologic right axillary lymph nodes, compatible with biopsy proven axillary metastases.   3. Small mass with associated biopsy related changes in the upper-outer left breast at site of known malignancy measures 0.9 x 0.8 x 0.7 cm. No abnormal lymph nodes seen in the left axilla.   RECOMMENDATION: Treatment plan for known bilateral breast malignancy.   BI-RADS CATEGORY  6: Known biopsy-proven malignancy.   10/08/2017 Initial Diagnosis  Cancer of overlapping sites of right female breast (HCC)   10/13/2017 Pathology Results   Diagnosis 1. Breast, right, needle core biopsy, 1 o'clock - INVASIVE DUCTAL CARCINOMA. - SEE COMMENT. 2. Breast, right, needle core biopsy, 7 o'clock - INVASIVE DUCTAL CARCINOMA. - SEE COMMENT. Microscopic Comment 1. and 2. The carcinoma in parts 1 and 2 is  morphologically similar and appears grade II. Because breast prognostic profiles were performed on the patients previous case, [id number], it will not be repeated on the current case unless requested.    10/17/2017 Imaging   CT CAP IMPRESSION: 1. Infiltrative right breast mass with overlying skin thickening is identified compatible with known breast cancer. 2. Enlarged right axillary lymph nodes compatible with metastatic adenopathy. 3. No additional sites of disease identified within the chest and no evidence for metastasis to the abdomen or pelvis. 4. Aortic atherosclerosis and 3 vessel coronary artery atherosclerotic calcifications. Aortic Atherosclerosis (ICD10-I70.0).   10/17/2017 Imaging   Bone Scan FINDINGS: There are no foci of increased or decreased radiotracer uptake to suggest osseous metastatic disease. 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 There is degenerative type uptake within both shoulders and the left ankle.   Normal physiologic activity is identified within the kidneys and urinary bladder.   IMPRESSION: No evidence of osseous metastatic disease.   10/22/2017 -  Chemotherapy   AC q2 weeks x4 cycles starting 10/22/17-12/03/17, followed by weekly taxol x12 12/17/17-03/11/18   11/14/2017 Genetic Testing   The genes that were analyzed were the 83 genes on [facility]'s Multi-Cancer panel (ALK, APC, ATM, AXIN2, BAP1, BARD1, BLM, BMPR1A, BRCA1, BRCA2, BRIP1, CASR, CDC73, CDH1, CDK4, CDKN1B, CDKN1C, CDKN2A, CEBPA, CHEK2, CTNNA1, DICER1, DIS3L2, EGFR, EPCAM, FH, FLCN, GATA2, GPC3, GREM1, HOXB13, HRAS, KIT, MAX, MEN1, MET, MITF, MLH1, MSH2, MSH3, MSH6, MUTYH, NBN, NF1, NF2, NTHL1, PALB2, PDGFRA, PHOX2B, PMS2, POLD1, POLE, POT1, PRKAR1A, PTCH1, PTEN, RAD50, RAD51C, RAD51D, RB1, RECQL4, RET, RUNX1, SDHA, SDHAF2, SDHB, SDHC, SDHD, SMAD4, SMARCA4, SMARCB1, SMARCE1, STK11, SUFU, TERC, TERT,  TMEM127, TP53, TSC1, TSC2, VHL, WRN, WT1).  Testing revealed a pathogenic mutation in the MITF gene called c.952G>A (p.Glu318Lys) and A Variant of Unknown Significance (VUS) was also detected: POLD1 c.301A>T (p.Ile101Phe).   12/16/2017 Breast US   IMPRESSION: 1. Stable to minimal decrease in size of multiple irregular masses throughout the right breast consistent with the patient's sites of biopsy proven malignancy. While the mammographic appearance is slightly less dense in this region, there is increased skin and trabecular thickening involving the entire right breast. 2. Stable appearance of morphologically abnormal right axillary lymph nodes, consistent with biopsy proven metastatic disease. 3. Stable appearance of a left breast mass, consistent with biopsy proven malignancy.     04/03/2018 Cancer Staging   Staging form: Breast, AJCC 8th Edition - Pathologic stage from 04/03/2018: No Stage Recommended (ypT3, pN2a, cM0, G3, ER+, PR-, HER2-) - Signed by Malachy Mood, MD on 05/03/2018   10/19/2018 -  Anti-estrogen oral therapy   Adjuvant letrozole 2.5mg  daily     Survivorship   Per Santiago Glad, NP    05/07/2023 Imaging   Nuclear medicine whole-body bone scan FINDINGS: new multifocal radiotracer activity within the thoracic spine and upper lumbar spine. Broad lesion within the RIGHT sacrum. Focal lesion in the LEFT scapula and posterior LEFT and RIGHT ribs. Multifocal lesions within the calvarium. Probable metastatic lesions within the LEFT and RIGHT femurs. Lesion the proximal RIGHT humerus as well  as the humeral heads.   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   Malignant neoplasm of left breast in female, estrogen receptor positive (HCC)  09/09/2017 Breast US   Targeted left breast ultrasound is performed, showing an oval parallel hypoechoic mass containing microcalcifications at the 2 o'clock position approximately 6 cm from nipple  measuring approximately 0.9 x 0.5 x 0.9 cm, corresponding to the mammographic finding.   Sonographic evaluation of the left axilla demonstrates no pathologic lymphadenopathy.   09/23/2017 Initial Biopsy   Diagnosis 1. Breast, left, needle core biopsy, 2 o'clock - INVASIVE DUCTAL CARCINOMA. - SEE COMMENT. 2. Breast, right, needle core biopsy, 11:30 o'clock - INVASIVE MAMMARY CARCINOMA. - SEE COMMENT. 3. Lymph node, needle/core biopsy, right axilla - METASTATIC MAMMARY CARCINOMA IN 1 OF 1 LYMPH NODE (1/1). Microscopic Comment 1. There is a small focus of grade I invasive ductal carcinoma. A complete breast prognostic profile will be attempted and the results reported separately. The results were called to the [facility] on 09/24/2017. 2. The carcinoma appears grade II. An E-Cadherin stain and a breast prognostic profile will be performed on part 2 and the results reported separately. (JBK:kh 09-24-17) [name]  1. HER2 Negative by FISH Estrogen receptor: 100% positive, strong staining Progesterone receptor: 80% positive, strong staining Ki67: 5%  2. HER2 Negative by FISH Estrogen receptor: 100% positive, strong staining Progesterone receptor: 0%, negative  Ki67: 30%   10/08/2017 Initial Diagnosis   Malignant neoplasm of left breast in female, estrogen receptor positive (HCC)   12/16/2017 Breast US   IMPRESSION: 3. Stable appearance of a left breast mass, consistent with biopsy proven malignancy.     01/22/2018 Imaging   01/22/2018 MRI Bilateral Breast IMPRESSION: 1. Dominant enhancing mass with surrounding nodularity involving the central anterior third of the right breast now measures up to 6.4 x 6.7 x 7.6 cm (previously measured 6.8 x 9.6 x 7.2 cm). Persistent bulky right axillary lymphadenopathy, slightly decreased in size since prior MRI.   2. Biopsy proven left breast malignancy now measures 0.5 cm (previously measured 0.9 x 0.8 x 0.7 cm).    04/03/2018  Pathology Results   Diagnosis 1. Breast, lumpectomy, left with radioactive seed - RESIDUAL INVASIVE DUCTAL CARCINOMA STATUS POST NEOADJUVANT THERAPY (0.9 CM) - MARGINS UNINVOLVED BY CARCINOMA (0.2 CM; INFERIOR MARGIN) - CALCIFICATIONS ASSOCIATED WITH CARCINOMA - PREVIOUS BIOPSY SITE CHANGES PRESENT - SEE ONCOLOGY TABLE AND COMMENT BELOW 2. Lymph node, sentinel, biopsy, left axillary #1 - NO CARCINOMA IDENTIFIED IN ONE LYMPH NODE (0/1) - SEE COMMENT 3. Lymph node, sentinel, biopsy, left axillary #2 - NO CARCINOMA IDENTIFIED IN ONE LYMPH NODE (0/1) - SEE COMMENT 4. Lymph node, sentinel, biopsy, left - NO CARCINOMA IDENTIFIED IN ONE LYMPH NODE (0/1) - SEE COMMENT 5. Lymph node, sentinel, biopsy, left axillary #3 - NO CARCINOMA IDENTIFIED IN ONE LYMPH NODE (0/1) - SEE COMMENT 6. Breast, modified radical mastectomy , right - RESIDUAL INVASIVE DUCTAL CARCINOMA STATUS POST NEOADJUVANT THERAPY (8.5 CM) - EXTENSIVE LYMPHOVASCULAR SPACE INVASION PRESENT - METASTATIC CARCINOMA INVOLVING FIVE OF FIFTEEN LYMPH NODES WITH EXTRACAPSULAR EXTENSION (5/15) - MARGINS UNINVOLVED BY CARCINOMA - SEE ONCOLOGY TABLE AND COMMENT BELOW   04/03/2018 Receptors her2   The tumor cells are NEGATIVE for Her2 (1+). Estrogen Receptor: 95%, POSITIVE, STRONG STAINING INTENSITY Progesterone Receptor: 95%, POSITIVE, STRONG STAINING INTENSITY  The tumor cells are NEGATIVE for Her2 (1+). Estrogen Receptor: 100%, POSITIVE, STRONG STAINING INTENSITY Progesterone Receptor: 0%, NEGATIVE  04/03/2018 Cancer Staging   Staging form: Breast, AJCC 8th Edition - Pathologic stage from 04/03/2018: No Stage Recommended (ypT1b, pN0, cM0, G1, ER+, PR+, HER2-) - Signed by Malachy Mood, MD on 05/03/2018   04/03/2018 Surgery   Left LEFT BREAST LUMPECTOMY WITH RADIOACTIVE SEED AND LEFT SENTINEL LYMPH NODE BIOPSY with Claud Kelp, MD    05/20/2018 - 07/08/2018 Radiation Therapy   Daily adjuvant radiation treatment with Dr.Moody.     Radiation treatment dates:   05/20/2018 - 07/08/2018   Site/dose:   The patient initially received a dose of 50.4 Gy in 28 fractions to the right chest wall and supraclavicular region. This was delivered using a 3-D conformal, 4 field technique. The patient then received a boost to the mastectomy scar. This delivered an additional 10 Gy in 5 fractions using an en face electron field. The total dose was 60.4 Gy. At the same time, the patient received a dose of 50.4 Gy in 28 fractions to the left breast using whole-breast tangent fields. This was delivered using a 3-D conformal technique. The patient then received a boost to the seroma. This delivered an additional 10 Gy in 5 fractions using 15E electrons with a special teletherapy technique. The total dose was 60.4 Gy.   10/19/2018 -  Anti-estrogen oral therapy   Letrozole 2.5mg  daily started 07/2018     Survivorship   Per Santiago Glad, NP    05/07/2023 Imaging   Nuclear medicine whole-body bone scan FINDINGS: new multifocal radiotracer activity within the thoracic spine and upper lumbar spine. Broad lesion within the RIGHT sacrum. Focal lesion in the LEFT scapula and posterior LEFT and RIGHT ribs. Multifocal lesions within the calvarium. Probable metastatic lesions within the LEFT and RIGHT femurs. Lesion the proximal RIGHT humerus as well as the humeral heads.   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     ALLERGIES:  is allergic to bee venom and tramadol.  MEDICATIONS:  Current Outpatient Medications  Medication Sig Dispense Refill  . amLODipine (NORVASC) 5 MG tablet Take 1 tablet (5 mg total) by mouth daily. 90 tablet 1  . b complex vitamins tablet Take 1 tablet by mouth daily. (Patient not taking: Reported on 05/12/2023)    . benzonatate (TESSALON) 100 MG capsule Take 1 capsule (100 mg total) by mouth every 8 (eight) hours. (Patient not taking: Reported on 02/19/2023) 21 capsule  0  . magnesium chloride (SLOW-MAG) 64 MG TBEC SR tablet Take by mouth. (Patient not taking: Reported on 02/19/2023)    . magnesium citrate SOLN Take 1 Bottle by mouth once. (Patient not taking: Reported on 02/19/2023)    . magnesium gluconate (MAGONATE) 500 MG tablet Take 500 mg by mouth daily. (Patient not taking: Reported on 05/12/2023)    . Nutritional Supplements (VITAMIN D BOOSTER PO) Take by mouth. (Patient not taking: Reported on 02/19/2023)    . ondansetron (ZOFRAN) 4 MG tablet Take 1 tablet (4 mg total) by mouth every 6 (six) hours. (Patient not taking: Reported on 05/12/2023) 12 tablet 0  . oxyCODONE (OXY IR/ROXICODONE) 5 MG immediate release tablet Take 1-2 tablets (5-10 mg total) by mouth 3 (three) times daily as needed for severe pain (pain score 7-10). 36 tablet 0  . tamoxifen (NOLVADEX) 20 MG tablet Take 1 tablet (20 mg total) by mouth daily. (Patient not taking: Reported on 05/12/2023) 90 tablet 3  . Ubiquinol (UBQH) 100 MG CAPS Take by mouth. (Patient not taking: Reported on 02/19/2023)  No current facility-administered medications for this visit.    VITAL SIGNS: There were no vitals taken for this visit. There were no vitals filed for this visit.  Estimated body mass index is 26.5 kg/m as calculated from the following:   Height as of 05/02/23: 5\' 4"  (1.626 m).   Weight as of 05/12/23: 154 lb 6.4 oz (70 kg).  LABS: CBC:    Component Value Date/Time   WBC 7.5 05/02/2023 1449   WBC 6.9 11/29/2022 0830   HGB 12.2 05/02/2023 1449   HGB 14.6 09/10/2017 1243   HCT 36.3 05/02/2023 1449   HCT 42.3 09/10/2017 1243   PLT 225 05/02/2023 1449   PLT 225 09/10/2017 1243   MCV 92.4 05/02/2023 1449   MCV 88 09/10/2017 1243   NEUTROABS 5.1 05/02/2023 1449   NEUTROABS 4.4 09/10/2017 1243   LYMPHSABS 1.7 05/02/2023 1449   LYMPHSABS 2.9 09/10/2017 1243   MONOABS 0.5 05/02/2023 1449   EOSABS 0.1 05/02/2023 1449   EOSABS 0.2 09/10/2017 1243   BASOSABS 0.0 05/02/2023 1449   BASOSABS  0.0 09/10/2017 1243   Comprehensive Metabolic Panel:    Component Value Date/Time   NA 138 05/02/2023 1449   NA 143 09/10/2017 1243   K 4.2 05/02/2023 1449   CL 103 05/02/2023 1449   CO2 28 05/02/2023 1449   BUN 21 05/02/2023 1449   BUN 25 09/10/2017 1243   CREATININE 0.79 05/02/2023 1449   GLUCOSE 98 05/02/2023 1449   CALCIUM 10.0 05/02/2023 1449   AST 15 05/02/2023 1449   ALT 10 05/02/2023 1449   ALKPHOS 86 05/02/2023 1449   BILITOT 0.3 05/02/2023 1449   PROT 6.9 05/02/2023 1449   PROT 6.5 09/10/2017 1243   ALBUMIN 3.9 05/02/2023 1449   ALBUMIN 4.8 09/10/2017 1243    RADIOGRAPHIC STUDIES: CT CHEST ABDOMEN PELVIS W CONTRAST Result Date: 05/21/2023 CLINICAL DATA:  Breast cancer staging, prior to preoperative systemic therapy * Tracking Code: BO * EXAM: CT CHEST, ABDOMEN, AND PELVIS WITH CONTRAST TECHNIQUE: Multidetector CT imaging of the chest, abdomen and pelvis was performed following the standard protocol during bolus administration of intravenous contrast. RADIATION DOSE REDUCTION: This exam was performed according to the departmental dose-optimization program which includes automated exposure control, adjustment of the mA and/or kV according to patient size and/or use of iterative reconstruction technique. CONTRAST:  OMNIPAQUE IOHEXOL 300 MG/ML  SOLN COMPARISON:  None Available. FINDINGS: CT CHEST FINDINGS Cardiovascular: Aortic atherosclerosis. Normal heart size. Left and right coronary artery calcifications. No pericardial effusion. Mediastinum/Nodes: Enlarged supraclavicular and lower cervical lymph nodes measuring up to 1.1 x 0.9 cm (series 2, image 8). Small prominent left axillary lymph nodes measuring up to 1.0 x 0.7 cm (series 2, image 21). Enlarged subcarinal and left hilar lymph nodes measuring up to 0.7 x 1.6 cm (series 2, image 32). Thyroid gland, trachea, and esophagus demonstrate no significant findings. Lungs/Pleura: Mild centrilobular and paraseptal emphysema.  Diffuse bilateral bronchial wall thickening. Infrahilar nodule of the left lower lobe, measuring 1.9 x 1.1 cm (series 4, image 94). Clustered nodularity distally in the left lower lobe, likely postobstructive airspace disease. Subpleural radiation fibrosis of the anterior right lung (series 4, image 70). No pleural effusion or pneumothorax. Musculoskeletal: Status post right mastectomy and axillary lymph node dissection. Status post left lumpectomy and axillary lymph node dissection with extensive overlying skin thickening of the left breast. No acute osseous findings. CT ABDOMEN PELVIS FINDINGS Hepatobiliary: No solid liver abnormality is seen. Simple benign left liver cyst.  No gallstones, gallbladder wall thickening, or biliary dilatation. Pancreas: Unremarkable. No pancreatic ductal dilatation or surrounding inflammatory changes. Spleen: Normal in size without significant abnormality. Adrenals/Urinary Tract: Nodularity of the bilateral adrenal glands, likely reflecting metastases, largest on the left measuring 1.5 x 1.2 cm (series 2, image 61). Kidneys are normal, without renal calculi, solid lesion, or hydronephrosis. Bladder is unremarkable. Stomach/Bowel: Stomach is within normal limits. Appendix not clearly visualized. No evidence of bowel wall thickening, distention, or inflammatory changes. Vascular/Lymphatic: Aortic atherosclerosis. No enlarged abdominal or pelvic lymph nodes. Reproductive: No mass or other abnormality. Other: No abdominal wall hernia or abnormality. No ascites. Musculoskeletal: No acute osseous findings. Widespread lytic osseous metastatic disease throughout the included axial and proximal appendicular skeleton. Several lesions have significant soft tissue components, particularly notable for a large lesion of the right aspect of L1 with significant epidural intrusion measuring 5.0 x 4.0 cm (series 2, image 60). Osseous metastatic disease also notable for large lytic lesions involving the  left hemipelvis and right femoral head and neck (series 2, image 96, 110, 117). IMPRESSION: 1. Status post right mastectomy and axillary lymph node dissection. Status post left lumpectomy and axillary lymph node dissection with extensive overlying skin thickening of the left breast. Small prominent left axillary lymph nodes, likely nodal metastases. 2. Widespread lytic osseous metastatic disease throughout the included axial and proximal appendicular skeleton. 3. Several lesions have significant soft tissue components, particularly notable for a large lesion of the right aspect of L1 with significant epidural intrusion, measuring 5.0 x 4.0 cm. 4. Osseous metastatic disease also notable for large lytic lesions involving the left hemipelvis and right femoral head and neck, possibly at risk for pathologic fracture. 5. Enlarged supraclavicular and lower cervical lymph nodes, as well as enlarged subcarinal left hilar lymph nodes and a left lower lobe pulmonary nodule consistent with metastatic disease. 6. Nodularity of the bilateral adrenal glands, likely reflecting metastases. 7. Emphysema. 8. Coronary artery disease. These results will be called to the ordering clinician or representative by the Radiologist Assistant, and communication documented in the PACS or [employer]. Aortic Atherosclerosis (ICD10-I70.0) and Emphysema (ICD10-J43.9). Electronically Signed   By: Jearld Lesch M.D.   On: 05/21/2023 14:51   NM Bone Scan Whole Body Result Date: 05/07/2023 CLINICAL DATA:  Breast cancer.  Evaluate for metastatic disease. EXAM: NUCLEAR MEDICINE WHOLE BODY BONE SCAN TECHNIQUE: Whole body anterior and posterior images were obtained approximately 3 hours after intravenous injection of radiopharmaceutical. RADIOPHARMACEUTICALS:  20.7 mCi Technetium-56m MDP IV COMPARISON:  Nuclear medicine bone scan 10/17/2017 FINDINGS: new multifocal radiotracer activity within the thoracic spine and upper lumbar spine. Broad lesion  within the RIGHT sacrum. Focal lesion in the LEFT scapula and posterior LEFT and RIGHT ribs. Multifocal lesions within the calvarium. Probable metastatic lesions within the LEFT and RIGHT femurs. Lesion the proximal RIGHT humerus as well as the humeral heads. 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 Electronically Signed   By: Genevive Bi M.D.   On: 05/07/2023 14:26    PERFORMANCE STATUS (ECOG) : {CHL ONC ECOG [id number]}  Review of Systems Unless otherwise noted, a complete review of systems is negative.  Physical Exam General: NAD Cardiovascular: regular rate and rhythm Pulmonary: clear ant fields Abdomen: soft, nontender, + bowel sounds Extremities: no edema, no joint deformities Skin: no rashes Neurological: Alert and oriented x3  IMPRESSION: *** I introduced myself, Maygan RN, and Palliative's role in collaboration with the oncology team. Concept of Palliative Care was introduced  as specialized medical care for people and their families living with serious illness.  It focuses on providing relief from the symptoms and stress of a serious illness.  The goal is to improve quality of life for both the patient and the family. Values and goals of care important to patient and family were attempted to be elicited.    We discussed *** current illness and what it means in the larger context of *** on-going co-morbidities. Natural disease trajectory and expectations were discussed.  I discussed the importance of continued conversation with family and their medical providers regarding overall plan of care and treatment options, ensuring decisions are within the context of the patients values and GOCs.  PLAN: Established therapeutic relationship. Education provided on palliative's role in collaboration with their Oncology/Radiation team. I will plan to see patient back in 2-4 weeks in collaboration to other oncology appointments.     Patient expressed understanding and was in agreement with this plan. She also understands that She can call the clinic at any time with any questions, concerns, or complaints.   Thank you for your referral and allowing Palliative to assist in Mrs. Rhett Bannister A Brenning's care.   Number and complexity of problems addressed: ***HIGH - 1 or more chronic illnesses with SEVERE exacerbation, progression, or side effects of treatment - advanced cancer, pain. Any controlled substances utilized were prescribed in the context of palliative care.   Visit consisted of counseling and education dealing with the complex and emotionally intense issues of symptom management and palliative care in the setting of serious and potentially life-threatening illness.  Signed by: Willette Alma, AGPCNP-BC Palliative Medicine Team/[facility]   *Please note that this is a verbal dictation therefore any spelling or grammatical errors are due to the "[employer]agon [employer] One" system interpretation.

## 2023-05-23 NOTE — Assessment & Plan Note (Signed)
stage IIIA (cT3N1M0), ER+/PR-/HER2-, [id number]; left breast - invasive ductal carcinoma, grade 1, stage IA (cT1bN0M0), ER+/PR+/HER2-, ypT1bN0 -Diagnosed in 09/2017. 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  -She is s/p neoadjuvant chemo with AC-T, left lumpectomy and right mastectomy. She had a very limited response to neoadjuvant chemotherapy. She also completed adjuvant radiation.  -She started antiestrogen therapy with Letrozole in 10/2018. Plan for 7 years.  She tolerated well.  Due to her osteoporosis, I changed letrozole to tamoxifen in September 2023. -She unfortunately developed a debilitating pain, especially in the left hip area, to the point not able to walk in dependently.  Bone scan in November 2024 and CT scanning in early December 2024 showed diffuse bone metastasis

## 2023-05-26 ENCOUNTER — Encounter: Payer: Self-pay | Admitting: Nurse Practitioner

## 2023-05-26 ENCOUNTER — Other Ambulatory Visit: Payer: Self-pay

## 2023-05-26 ENCOUNTER — Inpatient Hospital Stay (HOSPITAL_BASED_OUTPATIENT_CLINIC_OR_DEPARTMENT_OTHER): Payer: 59 | Admitting: Nurse Practitioner

## 2023-05-26 VITALS — BP 126/80 | HR 88 | Temp 98.5°F | Resp 18 | Wt 144.3 lb

## 2023-05-26 DIAGNOSIS — R634 Abnormal weight loss: Secondary | ICD-10-CM

## 2023-05-26 DIAGNOSIS — R530 Neoplastic (malignant) related fatigue: Secondary | ICD-10-CM

## 2023-05-26 DIAGNOSIS — Z515 Encounter for palliative care: Secondary | ICD-10-CM | POA: Diagnosis not present

## 2023-05-26 DIAGNOSIS — Z170 Estrogen receptor positive status [ER+]: Secondary | ICD-10-CM | POA: Diagnosis not present

## 2023-05-26 DIAGNOSIS — Z7189 Other specified counseling: Secondary | ICD-10-CM

## 2023-05-26 DIAGNOSIS — G893 Neoplasm related pain (acute) (chronic): Secondary | ICD-10-CM

## 2023-05-26 DIAGNOSIS — C50811 Malignant neoplasm of overlapping sites of right female breast: Secondary | ICD-10-CM | POA: Diagnosis not present

## 2023-05-26 DIAGNOSIS — R110 Nausea: Secondary | ICD-10-CM

## 2023-05-26 DIAGNOSIS — R630 Anorexia: Secondary | ICD-10-CM

## 2023-05-26 MED ORDER — PROCHLORPERAZINE MALEATE 10 MG PO TABS
10.0000 mg | ORAL_TABLET | Freq: Four times a day (QID) | ORAL | 0 refills | Status: DC | PRN
Start: 2023-05-26 — End: 2023-05-30

## 2023-05-26 MED ORDER — ONDANSETRON HCL 8 MG PO TABS
4.0000 mg | ORAL_TABLET | Freq: Three times a day (TID) | ORAL | 1 refills | Status: DC | PRN
Start: 2023-05-26 — End: 2023-06-26

## 2023-05-26 MED ORDER — OXYCODONE HCL 5 MG PO TABS
5.0000 mg | ORAL_TABLET | Freq: Four times a day (QID) | ORAL | 0 refills | Status: DC | PRN
Start: 2023-05-26 — End: 2023-06-02

## 2023-05-26 NOTE — Progress Notes (Incomplete)
Histology and Location of Primary Cancer: Right Breast with mets to bone  Location(s) of Symptomatic Metastases: Left Hip, L1   Past/Anticipated chemotherapy by medical oncology, if any:  Dr. Mosetta Putt 05/21/2023 -I personally reviewed her CT scan from May 16, 2023, which showed a diffuse bone metastasis.   -There is a 5.0 cm bone mets in the L1 with significant epidural extension, and extensive left pelvic bone metastasis, likely the cause of debility left hip pain.   -I recommend urgent palliative radiation for pain control.    Pain on a scale of 0-10 is:  7-8/10 Left hip, taking Oxycodone 10 mg BID.  She reports mid back pain.   If Spine Met(s), symptoms, if any, include: Bowel/Bladder retention or incontinence (please describe): She reports some constipation due to pain medications, taking colace. Numbness or weakness in extremities (please describe): Denies. Current Decadron regimen, if applicable: None  Ambulatory status? Walker? Wheelchair?: Ambulatory with walker short distances.  SAFETY ISSUES: Prior radiation? Yes, Bilateral Breast 05/20/2018-07/08/2018. Pacemaker/ICD? no Possible current pregnancy? Postmenopausal Is the patient on methotrexate? No  Current Complaints / other details:

## 2023-05-27 ENCOUNTER — Encounter: Payer: Self-pay | Admitting: Hematology

## 2023-05-27 NOTE — Progress Notes (Signed)
Radiation Oncology         [phone]) [phone] ________________________________  Name: Darlene Beasley        MRN: [id number]  Date of Service: 05/28/2023 DOB: [birth year]  WF:UXNAT, Burman Nieves, NP  Malachy Mood, MD     REFERRING PHYSICIAN: Malachy Mood, MD   DIAGNOSIS: There were no encounter diagnoses.   HISTORY OF PRESENT ILLNESS: Darlene Beasley is a 67 y.o. female seen at the request of Dr. Mosetta Putt for recurrent metastatic breast cancer.  The patient is known to our clinic as she was diagnosed with bilateral breast cancer in April 2019.  Her cancer on t in the right breast was Stage IIIA, cT3N1M0, grade 2, ER positive, invasive ductal carcinoma well she also had synchronous Stage IA, cT1bN0M0, grade 1 ER/PR positive invasive ductal carcinoma of the left breast.  She received neoadjuvant chemotherapy but no significant downstaging of her axilla was notable.  Her left breast mass was stable with this treatment and she underwent radical modified mastectomy of the right as well as left breast lumpectomy and sentinel lymph node biopsy on 04/03/2018.  Residual disease was noted in the left breast but not in her left axillary lymph nodes.  The right breast also contained bulky residual disease with extensive LVI and 5 of 15 positive lymph nodes with extracapsular extension, margins were negative on the right.  She received postmastectomy radiation over 6-1/2 weeks to the right chest wall and regional lymph nodes as well as external beam radiation to the whole left breast.    Since completing radiation, she took antiestrogen therapy but unfortunately developed debilitating pain in the left hip this year and a bone scan on 05/07/2023 showed new multifocal skeletal metastases within the axillary and appendicular skeleton sites and occluded the thoracic and upper lumbar spine, right sacrum, left scapula and posterior bilateral ribs, multifocal lesions were also noted in the calvarium, bilateral femurs and bilateral humeral  heads and right humeral shaft.  CT chest abdomen and pelvis on 05/16/2023 showed prior surgical changes of the chest wall and breast on the left with small left axillary lymph nodes felt to be concerning for metastatic disease, widespread lytic osseous disease throughout the skeleton and a large lesion at the right aspect of L1 with significant epidural intrusion measuring 5 cm, osseous metastatic disease involving the left hemipelvis, right femoral head and neck with the right femur felt to be at risk for pathologic fracture, she had enlarged cervical and supraclavicular adenopathy and nodularity of bilateral adrenal glands felt to be concerning for disease as well.  She met with Dr. Mosetta Putt to discuss her options for treatment, and is scheduled to undergo a CT biopsy of one of the malignant appearing findings in the bone on 06/03/2023 for purposes of retesting her prognostics for therapeutic rationale.  She is seen to consider palliative radiation for her painful bone disease specifically to the L1 lesion and pelvis including the left hip.    PREVIOUS RADIATION THERAPY:  07/21/2017 - 07/08/2018 The patient initially received a dose of 50.4 Gy in 28 fractions to the right chest wall and supraclavicular region. This was delivered using a 3-D conformal, 4 field technique. The patient then received a boost to the mastectomy scar. This delivered an additional 10 Gy in 5 fractions using an en face electron field. The total dose was 60.4 Gy. At the same time, the patient received a dose of 50.4 Gy in 28 fractions to the left breast using whole-breast tangent fields. This  was delivered using a 3-D conformal technique. The patient then received a boost to the seroma. This delivered an additional 10 Gy in 5 fractions using 15E electrons with a special teletherapy technique. The total dose was 60.4 Gy.   PAST MEDICAL HISTORY:  Past Medical History:  Diagnosis Date   Adopted    Arthritis    breast ca dx'd 08/2017    Genetic testing 11/14/2017   Multi-Cancer panel (83 genes) @ [facility] - Pathogenic mutation in the MITF gene   GERD (gastroesophageal reflux disease)    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)   Personal history of chemotherapy    Personal history of radiation therapy        PAST SURGICAL HISTORY: Past Surgical History:  Procedure Laterality Date   BREAST LUMPECTOMY Left 04/03/2018   BREAST LUMPECTOMY WITH RADIOACTIVE SEED AND SENTINEL LYMPH NODE BIOPSY Left 04/03/2018   Procedure: LEFT BREAST LUMPECTOMY WITH RADIOACTIVE SEED AND LEFT SENTINEL LYMPH NODE BIOPSY;  Surgeon: Claud Kelp, MD;  Location: [facility];  Service: General;  Laterality: Left;   IR IMAGING GUIDED PORT INSERTION  10/20/2017   IR US GUIDE VASC ACCESS LEFT  10/20/2017   MASTECTOMY Right 04/03/2018   MASTECTOMY MODIFIED RADICAL Right 04/03/2018   Procedure: MASTECTOMY MODIFIED RADICAL;  Surgeon: Claud Kelp, MD;  Location: [facility];  Service: General;  Laterality: Right;   PORT-A-CATH REMOVAL N/A 04/03/2018   Procedure: REMOVAL PORT-A-CATH;  Surgeon: Claud Kelp, MD;  Location: [facility];  Service: General;  Laterality: N/A;   TONSILLECTOMY Bilateral    TUBAL LIGATION       FAMILY HISTORY:  Family History  Adopted: Yes  Problem Relation Age of Onset   Congestive Heart Failure Mother    Congestive Heart Failure Father      SOCIAL HISTORY:  reports that she has been smoking cigarettes. She has a 25.5 pack-year smoking history. She has never used smokeless tobacco. She reports current alcohol use of about 1.0 standard drink of alcohol per week. She reports current drug use. Drug: Marijuana.   ALLERGIES: Bee venom and Tramadol   MEDICATIONS:  Current Outpatient Medications  Medication Sig Dispense Refill   amLODipine (NORVASC) 5 MG tablet Take 1 tablet (5 mg total) by mouth daily. 90 tablet  1   ondansetron (ZOFRAN) 8 MG tablet Take 0.5 tablets (4 mg total) by mouth every 8 (eight) hours as needed for nausea or vomiting. 45 tablet 1   oxyCODONE (OXY IR/ROXICODONE) 5 MG immediate release tablet Take 1-2 tablets (5-10 mg total) by mouth every 6 (six) hours as needed for severe pain (pain score 7-10) or moderate pain (pain score 4-6). 60 tablet 0   prochlorperazine (COMPAZINE) 10 MG tablet Take 1 tablet (10 mg total) by mouth every 6 (six) hours as needed for nausea or vomiting. 30 tablet 0   tamoxifen (NOLVADEX) 20 MG tablet Take 1 tablet (20 mg total) by mouth daily. (Patient not taking: Reported on 05/12/2023) 90 tablet 3   No current facility-administered medications for this visit.     REVIEW OF SYSTEMS: On review of systems, the patient reports that *** is doing well overall. *** denies any chest pain, shortness of breath, cough, fevers, chills, night sweats, unintended weight changes. *** denies any bowel or bladder disturbances, and denies abdominal pain, nausea or vomiting. *** denies any new musculoskeletal or joint aches or pains. A complete review of systems is obtained  and is otherwise negative.     PHYSICAL EXAM:  Wt Readings from Last 3 Encounters:  05/26/23 144 lb 4.8 oz (65.5 kg)  05/12/23 154 lb 6.4 oz (70 kg)  05/02/23 148 lb 1.6 oz (67.2 kg)   Temp Readings from Last 3 Encounters:  05/26/23 98.5 F (36.9 C) (Temporal)  05/12/23 (!) 97.4 F (36.3 C) (Temporal)  05/02/23 98.3 F (36.8 C) (Temporal)   BP Readings from Last 3 Encounters:  05/26/23 126/80  05/12/23 138/78  05/02/23 121/78   Pulse Readings from Last 3 Encounters:  05/26/23 88  05/12/23 70  05/02/23 81    /10  In general this is a well appearing *** in no acute distress. ***'s alert and oriented x4 and appropriate throughout the examination. Cardiopulmonary assessment is negative for acute distress and *** exhibits normal effort.     ECOG = ***  0 - Asymptomatic (Fully active, able  to carry on all predisease activities without restriction)  1 - Symptomatic but completely ambulatory (Restricted in physically strenuous activity but ambulatory and able to carry out work of a light or sedentary nature. For example, light housework, office work)  2 - Symptomatic, <50% in bed during the day (Ambulatory and capable of all self care but unable to carry out any work activities. Up and about more than 50% of waking hours)  3 - Symptomatic, >50% in bed, but not bedbound (Capable of only limited self-care, confined to bed or chair 50% or more of waking hours)  4 - Bedbound (Completely disabled. Cannot carry on any self-care. Totally confined to bed or chair)  5 - Death   Santiago Glad MM, Creech RH, Tormey DC, et al. [phone]). "Toxicity and response criteria of the [facility]". Am. Evlyn Clines. Oncol. 5 (6): 649-55    LABORATORY DATA:  Lab Results  Component Value Date   WBC 7.5 05/02/2023   HGB 12.2 05/02/2023   HCT 36.3 05/02/2023   MCV 92.4 05/02/2023   PLT 225 05/02/2023   Lab Results  Component Value Date   NA 138 05/02/2023   K 4.2 05/02/2023   CL 103 05/02/2023   CO2 28 05/02/2023   Lab Results  Component Value Date   ALT 10 05/02/2023   AST 15 05/02/2023   ALKPHOS 86 05/02/2023   BILITOT 0.3 05/02/2023      RADIOGRAPHY: CT CHEST ABDOMEN PELVIS W CONTRAST Result Date: 05/21/2023 CLINICAL DATA:  Breast cancer staging, prior to preoperative systemic therapy * Tracking Code: BO * EXAM: CT CHEST, ABDOMEN, AND PELVIS WITH CONTRAST TECHNIQUE: Multidetector CT imaging of the chest, abdomen and pelvis was performed following the standard protocol during bolus administration of intravenous contrast. RADIATION DOSE REDUCTION: This exam was performed according to the departmental dose-optimization program which includes automated exposure control, adjustment of the mA and/or kV according to patient size and/or use of iterative reconstruction technique.  CONTRAST:  OMNIPAQUE IOHEXOL 300 MG/ML  SOLN COMPARISON:  None Available. FINDINGS: CT CHEST FINDINGS Cardiovascular: Aortic atherosclerosis. Normal heart size. Left and right coronary artery calcifications. No pericardial effusion. Mediastinum/Nodes: Enlarged supraclavicular and lower cervical lymph nodes measuring up to 1.1 x 0.9 cm (series 2, image 8). Small prominent left axillary lymph nodes measuring up to 1.0 x 0.7 cm (series 2, image 21). Enlarged subcarinal and left hilar lymph nodes measuring up to 0.7 x 1.6 cm (series 2, image 32). Thyroid gland, trachea, and esophagus demonstrate no significant findings. Lungs/Pleura: Mild centrilobular and paraseptal emphysema. Diffuse bilateral  bronchial wall thickening. Infrahilar nodule of the left lower lobe, measuring 1.9 x 1.1 cm (series 4, image 94). Clustered nodularity distally in the left lower lobe, likely postobstructive airspace disease. Subpleural radiation fibrosis of the anterior right lung (series 4, image 70). No pleural effusion or pneumothorax. Musculoskeletal: Status post right mastectomy and axillary lymph node dissection. Status post left lumpectomy and axillary lymph node dissection with extensive overlying skin thickening of the left breast. No acute osseous findings. CT ABDOMEN PELVIS FINDINGS Hepatobiliary: No solid liver abnormality is seen. Simple benign left liver cyst. No gallstones, gallbladder wall thickening, or biliary dilatation. Pancreas: Unremarkable. No pancreatic ductal dilatation or surrounding inflammatory changes. Spleen: Normal in size without significant abnormality. Adrenals/Urinary Tract: Nodularity of the bilateral adrenal glands, likely reflecting metastases, largest on the left measuring 1.5 x 1.2 cm (series 2, image 61). Kidneys are normal, without renal calculi, solid lesion, or hydronephrosis. Bladder is unremarkable. Stomach/Bowel: Stomach is within normal limits. Appendix not clearly visualized. No evidence of  bowel wall thickening, distention, or inflammatory changes. Vascular/Lymphatic: Aortic atherosclerosis. No enlarged abdominal or pelvic lymph nodes. Reproductive: No mass or other abnormality. Other: No abdominal wall hernia or abnormality. No ascites. Musculoskeletal: No acute osseous findings. Widespread lytic osseous metastatic disease throughout the included axial and proximal appendicular skeleton. Several lesions have significant soft tissue components, particularly notable for a large lesion of the right aspect of L1 with significant epidural intrusion measuring 5.0 x 4.0 cm (series 2, image 60). Osseous metastatic disease also notable for large lytic lesions involving the left hemipelvis and right femoral head and neck (series 2, image 96, 110, 117). IMPRESSION: 1. Status post right mastectomy and axillary lymph node dissection. Status post left lumpectomy and axillary lymph node dissection with extensive overlying skin thickening of the left breast. Small prominent left axillary lymph nodes, likely nodal metastases. 2. Widespread lytic osseous metastatic disease throughout the included axial and proximal appendicular skeleton. 3. Several lesions have significant soft tissue components, particularly notable for a large lesion of the right aspect of L1 with significant epidural intrusion, measuring 5.0 x 4.0 cm. 4. Osseous metastatic disease also notable for large lytic lesions involving the left hemipelvis and right femoral head and neck, possibly at risk for pathologic fracture. 5. Enlarged supraclavicular and lower cervical lymph nodes, as well as enlarged subcarinal left hilar lymph nodes and a left lower lobe pulmonary nodule consistent with metastatic disease. 6. Nodularity of the bilateral adrenal glands, likely reflecting metastases. 7. Emphysema. 8. Coronary artery disease. These results will be called to the ordering clinician or representative by the Radiologist Assistant, and communication  documented in the PACS or [employer]. Aortic Atherosclerosis (ICD10-I70.0) and Emphysema (ICD10-J43.9). Electronically Signed   By: Jearld Lesch M.D.   On: 05/21/2023 14:51   NM Bone Scan Whole Body Result Date: 05/07/2023 CLINICAL DATA:  Breast cancer.  Evaluate for metastatic disease. EXAM: NUCLEAR MEDICINE WHOLE BODY BONE SCAN TECHNIQUE: Whole body anterior and posterior images were obtained approximately 3 hours after intravenous injection of radiopharmaceutical. RADIOPHARMACEUTICALS:  20.7 mCi Technetium-36m MDP IV COMPARISON:  Nuclear medicine bone scan 10/17/2017 FINDINGS: new multifocal radiotracer activity within the thoracic spine and upper lumbar spine. Broad lesion within the RIGHT sacrum. Focal lesion in the LEFT scapula and posterior LEFT and RIGHT ribs. Multifocal lesions within the calvarium. Probable metastatic lesions within the LEFT and RIGHT femurs. Lesion the proximal RIGHT humerus as well as the humeral heads. 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  Electronically Signed   By: Genevive Bi M.D.   On: 05/07/2023 14:26       IMPRESSION/PLAN: 1. Recurrent Metastatic Stage IIIA, cT3N1M0, grade 2, ER positive, invasive ductal carcinoma of the right breast with synchronous Stage IA, cT1bN0M0, grade 1 ER/PR positive invasive ductal carcinoma of the left breast, with disease affecting the bones, lymph nodes, and bilateral adrenal glands. Dr. Mitzi Hansen discusses the patient's course since her last visit.  He outlines the rationale for a course of palliative radiation.  We discussed the risks, benefits, short, and long term effects of radiotherapy, as well as the palliative intent, and the patient is interested in proceeding. Dr. Mitzi Hansen discusses the delivery and logistics of radiotherapy and anticipates a course of 2 weeks of radiotherapy to the***. Written consent is obtained and placed in the chart, a copy was provided to the  patient. She will simulate today ***   In a visit lasting *** minutes, greater than 50% of the time was spent face to face discussing the patient's condition, in preparation for the discussion, and coordinating the patient's care.   The above documentation reflects my direct findings during this shared patient visit. Please see the separate note by Dr. Mitzi Hansen on this date for the remainder of the patient's plan of care.    Osker Mason, [facility]   **Disclaimer: This note was dictated with voice recognition software. Similar sounding words can inadvertently be transcribed and this note may contain transcription errors which may not have been corrected upon publication of note.**

## 2023-05-28 ENCOUNTER — Ambulatory Visit
Admission: RE | Admit: 2023-05-28 | Discharge: 2023-05-28 | Disposition: A | Discharge status: Home / Self Care | Payer: 59 | Source: Ambulatory Visit | Attending: Radiation Oncology | Admitting: Radiation Oncology

## 2023-05-28 ENCOUNTER — Encounter: Payer: Self-pay | Admitting: Radiation Oncology

## 2023-05-28 VITALS — BP 125/77 | HR 88 | Temp 97.3°F | Resp 18 | Ht 64.0 in | Wt 147.2 lb

## 2023-05-28 DIAGNOSIS — C7951 Secondary malignant neoplasm of bone: Secondary | ICD-10-CM | POA: Insufficient documentation

## 2023-05-28 DIAGNOSIS — Z510 Encounter for antineoplastic radiation therapy: Secondary | ICD-10-CM | POA: Insufficient documentation

## 2023-05-28 DIAGNOSIS — J432 Centrilobular emphysema: Secondary | ICD-10-CM | POA: Insufficient documentation

## 2023-05-28 DIAGNOSIS — K219 Gastro-esophageal reflux disease without esophagitis: Secondary | ICD-10-CM | POA: Diagnosis not present

## 2023-05-28 DIAGNOSIS — Z9221 Personal history of antineoplastic chemotherapy: Secondary | ICD-10-CM | POA: Diagnosis not present

## 2023-05-28 DIAGNOSIS — Z79899 Other long term (current) drug therapy: Secondary | ICD-10-CM | POA: Insufficient documentation

## 2023-05-28 DIAGNOSIS — I700 Atherosclerosis of aorta: Secondary | ICD-10-CM | POA: Diagnosis not present

## 2023-05-28 DIAGNOSIS — Z923 Personal history of irradiation: Secondary | ICD-10-CM | POA: Diagnosis not present

## 2023-05-28 DIAGNOSIS — F17210 Nicotine dependence, cigarettes, uncomplicated: Secondary | ICD-10-CM | POA: Insufficient documentation

## 2023-05-28 DIAGNOSIS — Z170 Estrogen receptor positive status [ER+]: Secondary | ICD-10-CM | POA: Insufficient documentation

## 2023-05-28 DIAGNOSIS — C50412 Malignant neoplasm of upper-outer quadrant of left female breast: Secondary | ICD-10-CM | POA: Insufficient documentation

## 2023-05-28 DIAGNOSIS — I2510 Atherosclerotic heart disease of native coronary artery without angina pectoris: Secondary | ICD-10-CM | POA: Diagnosis not present

## 2023-05-28 DIAGNOSIS — M129 Arthropathy, unspecified: Secondary | ICD-10-CM | POA: Diagnosis not present

## 2023-05-28 DIAGNOSIS — N6489 Other specified disorders of breast: Secondary | ICD-10-CM | POA: Diagnosis not present

## 2023-05-28 DIAGNOSIS — F1290 Cannabis use, unspecified, uncomplicated: Secondary | ICD-10-CM | POA: Insufficient documentation

## 2023-05-29 ENCOUNTER — Ambulatory Visit
Admission: RE | Admit: 2023-05-29 | Discharge: 2023-05-29 | Disposition: A | Discharge status: Home / Self Care | Payer: 59 | Source: Ambulatory Visit | Attending: Radiation Oncology | Admitting: Radiation Oncology

## 2023-05-29 ENCOUNTER — Other Ambulatory Visit: Payer: Self-pay

## 2023-05-29 DIAGNOSIS — Z510 Encounter for antineoplastic radiation therapy: Secondary | ICD-10-CM | POA: Diagnosis not present

## 2023-05-29 LAB — RAD ONC ARIA SESSION SUMMARY
Course Elapsed Days: 0
Plan Fractions Treated to Date: 1
Plan Fractions Treated to Date: 1
Plan Fractions Treated to Date: 1
Plan Prescribed Dose Per Fraction: 3 Gy
Plan Prescribed Dose Per Fraction: 3 Gy
Plan Prescribed Dose Per Fraction: 3 Gy
Plan Total Fractions Prescribed: 10
Plan Total Fractions Prescribed: 10
Plan Total Fractions Prescribed: 10
Plan Total Prescribed Dose: 30 Gy
Plan Total Prescribed Dose: 30 Gy
Plan Total Prescribed Dose: 30 Gy
Reference Point Dosage Given to Date: 3 Gy
Reference Point Dosage Given to Date: 3 Gy
Reference Point Dosage Given to Date: 3 Gy
Reference Point Session Dosage Given: 3 Gy
Reference Point Session Dosage Given: 3 Gy
Reference Point Session Dosage Given: 3 Gy
Session Number: 1

## 2023-05-30 ENCOUNTER — Inpatient Hospital Stay: Payer: 59 | Admitting: Nurse Practitioner

## 2023-05-30 ENCOUNTER — Ambulatory Visit
Admission: RE | Admit: 2023-05-30 | Discharge: 2023-05-30 | Disposition: A | Discharge status: Home / Self Care | Payer: 59 | Source: Ambulatory Visit | Attending: Radiation Oncology | Admitting: Radiation Oncology

## 2023-05-30 ENCOUNTER — Other Ambulatory Visit: Payer: Self-pay

## 2023-05-30 ENCOUNTER — Other Ambulatory Visit: Payer: Self-pay | Admitting: Nurse Practitioner

## 2023-05-30 DIAGNOSIS — Z515 Encounter for palliative care: Secondary | ICD-10-CM

## 2023-05-30 DIAGNOSIS — R110 Nausea: Secondary | ICD-10-CM

## 2023-05-30 DIAGNOSIS — Z510 Encounter for antineoplastic radiation therapy: Secondary | ICD-10-CM | POA: Diagnosis not present

## 2023-05-30 LAB — RAD ONC ARIA SESSION SUMMARY
Course Elapsed Days: 1
Plan Fractions Treated to Date: 2
Plan Fractions Treated to Date: 2
Plan Fractions Treated to Date: 2
Plan Prescribed Dose Per Fraction: 3 Gy
Plan Prescribed Dose Per Fraction: 3 Gy
Plan Prescribed Dose Per Fraction: 3 Gy
Plan Total Fractions Prescribed: 10
Plan Total Fractions Prescribed: 10
Plan Total Fractions Prescribed: 10
Plan Total Prescribed Dose: 30 Gy
Plan Total Prescribed Dose: 30 Gy
Plan Total Prescribed Dose: 30 Gy
Reference Point Dosage Given to Date: 6 Gy
Reference Point Dosage Given to Date: 6 Gy
Reference Point Dosage Given to Date: 6 Gy
Reference Point Session Dosage Given: 3 Gy
Reference Point Session Dosage Given: 3 Gy
Reference Point Session Dosage Given: 3 Gy
Session Number: 2

## 2023-05-30 MED ORDER — PROCHLORPERAZINE MALEATE 10 MG PO TABS
10.0000 mg | ORAL_TABLET | Freq: Four times a day (QID) | ORAL | 3 refills | Status: DC | PRN
Start: 2023-05-30 — End: 2023-10-02

## 2023-06-02 ENCOUNTER — Telehealth: Payer: Self-pay

## 2023-06-02 ENCOUNTER — Other Ambulatory Visit (HOSPITAL_COMMUNITY): Payer: Self-pay | Admitting: Student

## 2023-06-02 ENCOUNTER — Ambulatory Visit: Payer: 59

## 2023-06-02 ENCOUNTER — Other Ambulatory Visit: Payer: Self-pay | Admitting: Nurse Practitioner

## 2023-06-02 DIAGNOSIS — C50811 Malignant neoplasm of overlapping sites of right female breast: Secondary | ICD-10-CM

## 2023-06-02 DIAGNOSIS — G893 Neoplasm related pain (acute) (chronic): Secondary | ICD-10-CM

## 2023-06-02 DIAGNOSIS — Z515 Encounter for palliative care: Secondary | ICD-10-CM

## 2023-06-02 MED ORDER — OXYCODONE HCL 5 MG PO TABS: 5.0000 mg | ORAL_TABLET | ORAL | 0 refills | Status: DC | PRN

## 2023-06-02 MED ORDER — XTAMPZA ER 9 MG PO C12A: 9.0000 mg | EXTENDED_RELEASE_CAPSULE | Freq: Two times a day (BID) | ORAL | 0 refills | Status: DC

## 2023-06-02 MED ORDER — DEXAMETHASONE 4 MG PO TABS: 4.0000 mg | ORAL_TABLET | Freq: Two times a day (BID) | ORAL | 0 refills | Status: DC

## 2023-06-02 NOTE — Telephone Encounter (Signed)
Spoke with pt to inform pt that [employer], NP sent a prescription in for MS Contin for long-acting pain coverage that the pt will take daily and for a steroid to help lower the bone pain the pt is experiencing.  Also, stated that Lowella Bandy is OK with the pt taking the Oxycodone Q4hrs if Q6hrs is not lowering the pt's pain.  Stated the prescriptions have been sent to the pt's preferred pharmacy.  Stated if the prescriptions require a PA, Lowella Bandy will resolve the PA on tomorrow 06/03/2023.  Pt verbalized understanding and had no further questions or concerns.  Pt was very "Thankful" for all the help Lowella Bandy has given her.

## 2023-06-02 NOTE — Telephone Encounter (Signed)
Pt's significant other ([name]) called stating that the pt's pain in her lower hips is not being managed by the Oxycodone IR 5mg .  Bethann Berkshire stated the pt is having to take more of the Oxycodone than normal to find comfort, therefore, he is requesting if something else could be prescribed for pt's pain.  Stated Palliative is out of the office today but this nurse will send a message to the Palliative Care Team along with Dr. Latanya Maudlin team.  Bethann Berkshire asked if the prescription could be sent prior to 5pm to the pt's preferred pharmacy d/t the pharmacy closes at 5:30pm everyday.  Stated this nurse will make them aware.  Notified palliative care and Dr. Latanya Maudlin team.

## 2023-06-03 ENCOUNTER — Ambulatory Visit (HOSPITAL_COMMUNITY)
Admission: RE | Admit: 2023-06-03 | Discharge: 2023-06-03 | Disposition: A | Discharge status: Home / Self Care | Payer: 59 | Source: Ambulatory Visit | Attending: Hematology | Admitting: Hematology

## 2023-06-03 ENCOUNTER — Ambulatory Visit
Admission: RE | Admit: 2023-06-03 | Discharge: 2023-06-03 | Disposition: A | Discharge status: Home / Self Care | Payer: 59 | Source: Ambulatory Visit | Attending: Radiation Oncology

## 2023-06-03 ENCOUNTER — Other Ambulatory Visit: Payer: Self-pay

## 2023-06-03 ENCOUNTER — Encounter: Payer: Self-pay | Admitting: Hematology

## 2023-06-03 DIAGNOSIS — C7951 Secondary malignant neoplasm of bone: Secondary | ICD-10-CM | POA: Insufficient documentation

## 2023-06-03 DIAGNOSIS — Z9221 Personal history of antineoplastic chemotherapy: Secondary | ICD-10-CM | POA: Insufficient documentation

## 2023-06-03 DIAGNOSIS — I2510 Atherosclerotic heart disease of native coronary artery without angina pectoris: Secondary | ICD-10-CM | POA: Diagnosis not present

## 2023-06-03 DIAGNOSIS — C50811 Malignant neoplasm of overlapping sites of right female breast: Secondary | ICD-10-CM | POA: Diagnosis not present

## 2023-06-03 DIAGNOSIS — J432 Centrilobular emphysema: Secondary | ICD-10-CM | POA: Insufficient documentation

## 2023-06-03 DIAGNOSIS — R590 Localized enlarged lymph nodes: Secondary | ICD-10-CM | POA: Diagnosis not present

## 2023-06-03 DIAGNOSIS — Z1722 Progesterone receptor negative status: Secondary | ICD-10-CM | POA: Insufficient documentation

## 2023-06-03 DIAGNOSIS — Z853 Personal history of malignant neoplasm of breast: Secondary | ICD-10-CM | POA: Insufficient documentation

## 2023-06-03 DIAGNOSIS — M533 Sacrococcygeal disorders, not elsewhere classified: Secondary | ICD-10-CM | POA: Diagnosis not present

## 2023-06-03 DIAGNOSIS — Z9011 Acquired absence of right breast and nipple: Secondary | ICD-10-CM | POA: Diagnosis not present

## 2023-06-03 DIAGNOSIS — Z170 Estrogen receptor positive status [ER+]: Secondary | ICD-10-CM

## 2023-06-03 LAB — RAD ONC ARIA SESSION SUMMARY
Course Elapsed Days: 5
Plan Fractions Treated to Date: 3
Plan Fractions Treated to Date: 3
Plan Fractions Treated to Date: 3
Plan Prescribed Dose Per Fraction: 3 Gy
Plan Prescribed Dose Per Fraction: 3 Gy
Plan Prescribed Dose Per Fraction: 3 Gy
Plan Total Fractions Prescribed: 10
Plan Total Fractions Prescribed: 10
Plan Total Fractions Prescribed: 10
Plan Total Prescribed Dose: 30 Gy
Plan Total Prescribed Dose: 30 Gy
Plan Total Prescribed Dose: 30 Gy
Reference Point Dosage Given to Date: 9 Gy
Reference Point Dosage Given to Date: 9 Gy
Reference Point Dosage Given to Date: 9 Gy
Reference Point Session Dosage Given: 3 Gy
Reference Point Session Dosage Given: 3 Gy
Reference Point Session Dosage Given: 3 Gy
Session Number: 3

## 2023-06-03 LAB — CBC
HCT: 37.1 % (ref 36.0–46.0)
Hemoglobin: 12.5 g/dL (ref 12.0–15.0)
MCH: 30.9 pg (ref 26.0–34.0)
MCHC: 33.7 g/dL (ref 30.0–36.0)
MCV: 91.6 fL (ref 80.0–100.0)
Platelets: 144 10*3/uL — ABNORMAL LOW (ref 150–400)
RBC: 4.05 MIL/uL (ref 3.87–5.11)
RDW: 13.3 % (ref 11.5–15.5)
WBC: 5.7 10*3/uL (ref 4.0–10.5)
nRBC: 0 % (ref 0.0–0.2)

## 2023-06-03 LAB — PROTIME-INR
INR: 1.1 (ref 0.8–1.2)
Prothrombin Time: 13.9 s (ref 11.4–15.2)

## 2023-06-03 MED ORDER — FENTANYL CITRATE (PF) 100 MCG/2ML IJ SOLN
INTRAMUSCULAR | Status: AC
  Filled 2023-06-03: qty 4

## 2023-06-03 MED ORDER — MIDAZOLAM HCL 2 MG/2ML IJ SOLN
INTRAMUSCULAR | Status: AC
  Filled 2023-06-03: qty 4

## 2023-06-03 MED ORDER — ONDANSETRON 4 MG PO TBDP: 4.0000 mg | ORAL_TABLET | Freq: Once | ORAL | Status: DC

## 2023-06-03 MED ORDER — ONDANSETRON HCL 4 MG/2ML IJ SOLN: 4.0000 mg | Freq: Once | INTRAMUSCULAR | Status: DC

## 2023-06-03 MED ORDER — HYDROCODONE-ACETAMINOPHEN 5-325 MG PO TABS: 1.0000 | ORAL_TABLET | ORAL | Status: DC | PRN

## 2023-06-03 MED ORDER — MIDAZOLAM HCL 2 MG/2ML IJ SOLN
INTRAMUSCULAR | Status: AC | PRN
  Administered 2023-06-03: 1 mg via INTRAVENOUS
  Administered 2023-06-03: .5 mg via INTRAVENOUS

## 2023-06-03 MED ORDER — LIDOCAINE HCL 1 % IJ SOLN
10.0000 mL | Freq: Once | INTRAMUSCULAR | Status: AC
  Administered 2023-06-03: 10 mL via INTRADERMAL

## 2023-06-03 MED ORDER — ONDANSETRON HCL 4 MG/2ML IJ SOLN
4.0000 mg | Freq: Once | INTRAMUSCULAR | Status: AC
  Administered 2023-06-03: 4 mg via INTRAVENOUS
  Filled 2023-06-03: qty 2

## 2023-06-03 MED ORDER — ONDANSETRON HCL 4 MG/2ML IJ SOLN
INTRAMUSCULAR | Status: AC
  Filled 2023-06-03: qty 2

## 2023-06-03 MED ORDER — ONDANSETRON HCL 4 MG/2ML IJ SOLN
INTRAMUSCULAR | Status: AC | PRN
  Administered 2023-06-03: 4 mg via INTRAVENOUS

## 2023-06-03 MED ORDER — FENTANYL CITRATE (PF) 100 MCG/2ML IJ SOLN
INTRAMUSCULAR | Status: AC | PRN
  Administered 2023-06-03 (×2): 25 ug via INTRAVENOUS
  Administered 2023-06-03: 50 ug via INTRAVENOUS

## 2023-06-03 NOTE — Telephone Encounter (Signed)
Received telephone advice message from pharmacy regarding pt's pain med.  Extended release med will not be in until Thurs. Pt has immediate release.  Pharmacy wanted to know if 13.5 mg dose would be appropriate daily instead.  Discussed with [facility] & no change,  Pt ok to wait till Thurs.

## 2023-06-03 NOTE — Procedures (Signed)
Vascular and Interventional Radiology Procedure Note  Patient: Darlene Beasley DOB: 03/27/1956 Medical Record Number: [id number] Note Date/Time: 06/03/23 10:04 AM   Performing Physician: Roanna Banning, MD Assistant(s): None  Diagnosis: Hx breast CA   Procedure: BONE LESION BIOPSY  Anesthesia: Conscious Sedation Complications: None Estimated Blood Loss: Minimal Specimens: Sent for Cytology and Pathology  Findings:  Successful CT-guided LEFT iliac bone lesion biopsy A total of 2 cores were obtained. Hemostasis of the tract was achieved using Manual Pressure.  Plan: Bed rest for 1 hours.  See detailed procedure note with images in PACS. The patient tolerated the procedure well without incident or complication and was returned to Recovery in stable condition.    Roanna Banning, MD [facility] Radiology   Pager. [phone] Clinic. [phone]

## 2023-06-03 NOTE — H&P (Signed)
Chief Complaint: Patient was seen in consultation today for bone lesion biopsy  Referring Physician(s): Feng,Yan  Supervising Physician: Roanna Banning  Patient Status: [facility] - Out-pt  History of Present Illness: Darlene Beasley is a 67 y.o. female with a medical history significant for bilateral breast cancer initially diagnosed in 2019. She is s/p left lumpectomy, right mastectomy and chemo/radiation. Recent imaging shows diffuse bone metastases.   CT chest/abdomen/pelvis  IMPRESSION: 1. Status post right mastectomy and axillary lymph node dissection. Status post left lumpectomy and axillary lymph node dissection with extensive overlying skin thickening of the left breast. Small prominent left axillary lymph nodes, likely nodal metastases. 2. Widespread lytic osseous metastatic disease throughout the included axial and proximal appendicular skeleton. 3. Several lesions have significant soft tissue components, particularly notable for a large lesion of the right aspect of L1 with significant epidural intrusion, measuring 5.0 x 4.0 cm. 4. Osseous metastatic disease also notable for large lytic lesions involving the left hemipelvis and right femoral head and neck, possibly at risk for pathologic fracture. 5. Enlarged supraclavicular and lower cervical lymph nodes, as well as enlarged subcarinal left hilar lymph nodes and a left lower lobe pulmonary nodule consistent with metastatic disease. 6. Nodularity of the bilateral adrenal glands, likely reflecting metastases. 7. Emphysema. 8. Coronary artery disease.  Interventional Radiology has been asked to evaluate this patient for an image-guided left iliac bone lesion biopsy. Imaging reviewed and procedure approved by Dr. Miles Costain.   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)   Personal history of chemotherapy    Personal history of radiation therapy     Past Surgical History:  Procedure Laterality Date   BREAST LUMPECTOMY Left 04/03/2018   BREAST LUMPECTOMY WITH RADIOACTIVE SEED AND SENTINEL LYMPH NODE BIOPSY Left 04/03/2018   Procedure: LEFT BREAST LUMPECTOMY WITH RADIOACTIVE SEED AND LEFT SENTINEL LYMPH NODE BIOPSY;  Surgeon: Claud Kelp, MD;  Location: [facility];  Service: General;  Laterality: Left;   IR IMAGING GUIDED PORT INSERTION  10/20/2017   IR US GUIDE VASC ACCESS LEFT  10/20/2017   MASTECTOMY Right 04/03/2018   MASTECTOMY MODIFIED RADICAL Right 04/03/2018   Procedure: MASTECTOMY MODIFIED RADICAL;  Surgeon: Claud Kelp, MD;  Location: [facility];  Service: General;  Laterality: Right;   PORT-A-CATH REMOVAL N/A 04/03/2018   Procedure: REMOVAL PORT-A-CATH;  Surgeon: Claud Kelp, MD;  Location:  SURGERY CENTER;  Service: General;  Laterality: N/A;   TONSILLECTOMY Bilateral    TUBAL LIGATION      Allergies: Bee venom and Tramadol  Medications: Prior to Admission medications   Medication Sig Start Date End Date Taking? Authorizing Provider  amLODipine (NORVASC) 5 MG tablet Take 1 tablet (5 mg total) by mouth daily. 02/19/23  Yes Pollyann Samples, NP  dexamethasone (DECADRON) 4 MG tablet Take 1 tablet (4 mg total) by mouth 2 (two) times daily with a meal. 06/02/23  Yes Pickenpack-Cousar, Arty Baumgartner, NP  docusate sodium (COLACE) 50 MG capsule Take 50 mg by mouth 2 (two) times daily.   Yes [provider]  ondansetron (ZOFRAN) 8 MG tablet Take 0.5 tablets (4 mg total) by mouth every 8 (  eight) hours as needed for nausea or vomiting. 05/26/23  Yes Pickenpack-Cousar, Arty Baumgartner, NP  oxyCODONE (OXY IR/ROXICODONE) 5 MG immediate release tablet Take 1-2 tablets  (5-10 mg total) by mouth every 4 (four) hours as needed for severe pain (pain score 7-10) or breakthrough pain. 06/02/23  Yes Pickenpack-Cousar, Arty Baumgartner, NP  oxyCODONE ER (XTAMPZA ER) 9 MG C12A Take 9 mg by mouth every 12 (twelve) hours. 06/02/23  Yes Pickenpack-Cousar, Arty Baumgartner, NP  prochlorperazine (COMPAZINE) 10 MG tablet Take 1 tablet (10 mg total) by mouth every 6 (six) hours as needed for nausea or vomiting. 05/30/23  Yes Pickenpack-Cousar, Arty Baumgartner, NP  tamoxifen (NOLVADEX) 20 MG tablet Take 1 tablet (20 mg total) by mouth daily. 02/19/23  Yes Pollyann Samples, NP     Family History  Adopted: Yes  Problem Relation Age of Onset   Congestive Heart Failure Mother    Congestive Heart Failure Father     Social History   Socioeconomic History   Marital status: Significant Other    Spouse name: Not on file   Number of children: 1   Years of education: Not on file   Highest education level: Not on file  Occupational History   Not on file  Tobacco Use   Smoking status: Every Day    Current packs/day: 0.50    Average packs/day: 0.5 packs/day for 51.0 years (25.5 ttl pk-yrs)    Types: Cigarettes   Smokeless tobacco: Never   Tobacco comments:    Working on quitting.  Vaping Use   Vaping status: Never Used  Substance and Sexual Activity   Alcohol use: Yes    Alcohol/week: 1.0 standard drink of alcohol    Types: 1 Shots of liquor per week    Comment: Rare   Drug use: Yes    Types: Marijuana    Comment: daily, since age 39    Sexual activity: Not Currently    Birth control/protection: Sponge, Surgical  Other Topics Concern   Not on file  Social History Narrative   Not on file   Social Drivers of Health   Financial Resource Strain: Not on file  Food Insecurity: Food Insecurity Present (05/28/2023)   Hunger Vital Sign    Worried About Running Out of Food in the Last Year: Sometimes true    Ran Out of Food in the Last Year: Never true  Transportation Needs: No  Transportation Needs (05/28/2023)   PRAPARE - Administrator, Civil Service (Medical): No    Lack of Transportation (Non-Medical): No  Physical Activity: Sufficiently Active (09/16/2017)   Exercise Vital Sign    Days of Exercise per Week: 7 days    Minutes of Exercise per Session: 80 min  Stress: Stress Concern Present (09/16/2017)   [employer] of Occupational Health - Occupational Stress Questionnaire    Feeling of Stress : Very much  Social Connections: Moderately Isolated (09/16/2017)   Social Connection and Isolation Panel [NHANES]    Frequency of Communication with Friends and Family: Never    Frequency of Social Gatherings with Friends and Family: Never    Attends Religious Services: Never    Database administrator or Organizations: No    Attends Engineer, structural: Never    Marital Status: Living with partner    Review of Systems: A 12 point ROS discussed and pertinent positives are indicated in the HPI above.  All other systems are negative.  Review of Systems  Constitutional:  Positive  for appetite change, fatigue and unexpected weight change.  Respiratory:  Negative for cough and shortness of breath.   Cardiovascular:  Negative for chest pain and leg swelling.  Gastrointestinal:  Positive for nausea and vomiting. Negative for abdominal pain.  Musculoskeletal:  Positive for arthralgias, back pain and myalgias.  Neurological:  Negative for dizziness and headaches.    Vital Signs: BP (!) 147/90   Pulse 95   Temp 97.8 F (36.6 C) (Oral)   Resp 16   Ht 5\' 4"  (1.626 m)   Wt 147 lb (66.7 kg)   SpO2 95%   BMI 25.23 kg/m   Physical Exam Constitutional:      General: She is not in acute distress.    Appearance: She is not ill-appearing.  HENT:     Mouth/Throat:     Mouth: Mucous membranes are moist.     Pharynx: Oropharynx is clear.  Cardiovascular:     Rate and Rhythm: Normal rate.     Pulses: Normal pulses.  Pulmonary:     Effort:  Pulmonary effort is normal.  Skin:    General: Skin is warm and dry.  Neurological:     Mental Status: She is alert and oriented to person, place, and time.  Psychiatric:        Mood and Affect: Mood normal.        Behavior: Behavior normal.        Thought Content: Thought content normal.        Judgment: Judgment normal.     Imaging: CT CHEST ABDOMEN PELVIS W CONTRAST Result Date: 05/21/2023 CLINICAL DATA:  Breast cancer staging, prior to preoperative systemic therapy * Tracking Code: BO * EXAM: CT CHEST, ABDOMEN, AND PELVIS WITH CONTRAST TECHNIQUE: Multidetector CT imaging of the chest, abdomen and pelvis was performed following the standard protocol during bolus administration of intravenous contrast. RADIATION DOSE REDUCTION: This exam was performed according to the departmental dose-optimization program which includes automated exposure control, adjustment of the mA and/or kV according to patient size and/or use of iterative reconstruction technique. CONTRAST:  OMNIPAQUE IOHEXOL 300 MG/ML  SOLN COMPARISON:  None Available. FINDINGS: CT CHEST FINDINGS Cardiovascular: Aortic atherosclerosis. Normal heart size. Left and right coronary artery calcifications. No pericardial effusion. Mediastinum/Nodes: Enlarged supraclavicular and lower cervical lymph nodes measuring up to 1.1 x 0.9 cm (series 2, image 8). Small prominent left axillary lymph nodes measuring up to 1.0 x 0.7 cm (series 2, image 21). Enlarged subcarinal and left hilar lymph nodes measuring up to 0.7 x 1.6 cm (series 2, image 32). Thyroid gland, trachea, and esophagus demonstrate no significant findings. Lungs/Pleura: Mild centrilobular and paraseptal emphysema. Diffuse bilateral bronchial wall thickening. Infrahilar nodule of the left lower lobe, measuring 1.9 x 1.1 cm (series 4, image 94). Clustered nodularity distally in the left lower lobe, likely postobstructive airspace disease. Subpleural radiation fibrosis of the anterior  right lung (series 4, image 70). No pleural effusion or pneumothorax. Musculoskeletal: Status post right mastectomy and axillary lymph node dissection. Status post left lumpectomy and axillary lymph node dissection with extensive overlying skin thickening of the left breast. No acute osseous findings. CT ABDOMEN PELVIS FINDINGS Hepatobiliary: No solid liver abnormality is seen. Simple benign left liver cyst. No gallstones, gallbladder wall thickening, or biliary dilatation. Pancreas: Unremarkable. No pancreatic ductal dilatation or surrounding inflammatory changes. Spleen: Normal in size without significant abnormality. Adrenals/Urinary Tract: Nodularity of the bilateral adrenal glands, likely reflecting metastases, largest on the left measuring 1.5 x 1.2 cm (series  2, image 61). Kidneys are normal, without renal calculi, solid lesion, or hydronephrosis. Bladder is unremarkable. Stomach/Bowel: Stomach is within normal limits. Appendix not clearly visualized. No evidence of bowel wall thickening, distention, or inflammatory changes. Vascular/Lymphatic: Aortic atherosclerosis. No enlarged abdominal or pelvic lymph nodes. Reproductive: No mass or other abnormality. Other: No abdominal wall hernia or abnormality. No ascites. Musculoskeletal: No acute osseous findings. Widespread lytic osseous metastatic disease throughout the included axial and proximal appendicular skeleton. Several lesions have significant soft tissue components, particularly notable for a large lesion of the right aspect of L1 with significant epidural intrusion measuring 5.0 x 4.0 cm (series 2, image 60). Osseous metastatic disease also notable for large lytic lesions involving the left hemipelvis and right femoral head and neck (series 2, image 96, 110, 117). IMPRESSION: 1. Status post right mastectomy and axillary lymph node dissection. Status post left lumpectomy and axillary lymph node dissection with extensive overlying skin thickening of the  left breast. Small prominent left axillary lymph nodes, likely nodal metastases. 2. Widespread lytic osseous metastatic disease throughout the included axial and proximal appendicular skeleton. 3. Several lesions have significant soft tissue components, particularly notable for a large lesion of the right aspect of L1 with significant epidural intrusion, measuring 5.0 x 4.0 cm. 4. Osseous metastatic disease also notable for large lytic lesions involving the left hemipelvis and right femoral head and neck, possibly at risk for pathologic fracture. 5. Enlarged supraclavicular and lower cervical lymph nodes, as well as enlarged subcarinal left hilar lymph nodes and a left lower lobe pulmonary nodule consistent with metastatic disease. 6. Nodularity of the bilateral adrenal glands, likely reflecting metastases. 7. Emphysema. 8. Coronary artery disease. These results will be called to the ordering clinician or representative by the Radiologist Assistant, and communication documented in the PACS or [employer]. Aortic Atherosclerosis (ICD10-I70.0) and Emphysema (ICD10-J43.9). Electronically Signed   By: Jearld Lesch M.D.   On: 05/21/2023 14:51   NM Bone Scan Whole Body Result Date: 05/07/2023 CLINICAL DATA:  Breast cancer.  Evaluate for metastatic disease. EXAM: NUCLEAR MEDICINE WHOLE BODY BONE SCAN TECHNIQUE: Whole body anterior and posterior images were obtained approximately 3 hours after intravenous injection of radiopharmaceutical. RADIOPHARMACEUTICALS:  20.7 mCi Technetium-54m MDP IV COMPARISON:  Nuclear medicine bone scan 10/17/2017 FINDINGS: new multifocal radiotracer activity within the thoracic spine and upper lumbar spine. Broad lesion within the RIGHT sacrum. Focal lesion in the LEFT scapula and posterior LEFT and RIGHT ribs. Multifocal lesions within the calvarium. Probable metastatic lesions within the LEFT and RIGHT femurs. Lesion the proximal RIGHT humerus as well as the humeral heads.  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 Electronically Signed   By: Genevive Bi M.D.   On: 05/07/2023 14:26    Labs:  CBC: Recent Labs    11/29/22 0830 02/19/23 1114 05/02/23 1449 06/03/23 0928  WBC 6.9 5.1 7.5 5.7  HGB 15.6* 13.6 12.2 12.5  HCT 45.7 38.7 36.3 37.1  PLT 243 205 225 144*    COAGS: No results for input(s): "INR", "APTT" in the last 8760 hours.  BMP: Recent Labs    08/16/22 1437 11/29/22 0830 02/19/23 1114 05/02/23 1449  NA 140 134* 142 138  K 4.6 3.5 4.3 4.2  CL 107 97* 108 103  CO2 27 25 28 28   GLUCOSE 95 120* 98 98  BUN 21 17 21 21   CALCIUM 9.4 9.0 9.5 10.0  CREATININE 0.80 0.72 0.90 0.79  GFRNONAA >60 >60 >60 >60  LIVER FUNCTION TESTS: Recent Labs    08/16/22 1437 02/19/23 1114 05/02/23 1449  BILITOT 0.4 0.4 0.3  AST 15 13* 15  ALT 13 9 10   ALKPHOS 42 64 86  PROT 6.4* 6.6 6.9  ALBUMIN 4.1 4.0 3.9    TUMOR MARKERS: No results for input(s): "AFPTM", "CEA", "CA199", "CHROMGRNA" in the last 8760 hours.  Assessment and Plan:  Breast cancer with metastases: Darlene Beasley, 67 year old female, presents today to the [facility] Interventional Radiology department for an image-guided left iliac bone lesion biopsy.  Risks and benefits of this procedure were discussed with the patient and/or patient's family including, but not limited to bleeding, infection, damage to adjacent structures or low yield requiring additional tests.  All of the questions were answered and there is agreement to proceed. She has been NPO.   Consent signed and in chart.  Thank you for this interesting consult.  I greatly enjoyed meeting Darlene Beasley and look forward to participating in their care.  A copy of this report was sent to the requesting provider on this date.  Electronically Signed: Alwyn Ren, AGACNP-BC [phone] 06/03/2023, 9:51 AM   I spent a total of  30 Minutes   in face to face  in clinical consultation, greater than 50% of which was counseling/coordinating care for bone lesion biopsy.

## 2023-06-04 ENCOUNTER — Encounter: Payer: Self-pay | Admitting: Hematology

## 2023-06-04 NOTE — Progress Notes (Signed)
Erroneous

## 2023-06-05 ENCOUNTER — Ambulatory Visit
Admission: RE | Admit: 2023-06-05 | Discharge: 2023-06-05 | Disposition: A | Discharge status: Home / Self Care | Payer: 59 | Source: Ambulatory Visit | Attending: Radiation Oncology | Admitting: Radiation Oncology

## 2023-06-05 ENCOUNTER — Emergency Department (HOSPITAL_COMMUNITY): Payer: 59

## 2023-06-05 ENCOUNTER — Other Ambulatory Visit: Payer: Self-pay

## 2023-06-05 ENCOUNTER — Encounter (HOSPITAL_COMMUNITY): Payer: Self-pay | Admitting: *Deleted

## 2023-06-05 ENCOUNTER — Other Ambulatory Visit: Payer: Self-pay | Admitting: Radiation Oncology

## 2023-06-05 ENCOUNTER — Inpatient Hospital Stay (HOSPITAL_COMMUNITY)
Admission: EM | Admit: 2023-06-05 | Discharge: 2023-07-03 | DRG: 542 | Disposition: A | Discharge status: Hospice | Payer: 59 | Source: Ambulatory Visit | Attending: Internal Medicine | Admitting: Internal Medicine

## 2023-06-05 DIAGNOSIS — Z9221 Personal history of antineoplastic chemotherapy: Secondary | ICD-10-CM

## 2023-06-05 DIAGNOSIS — F17210 Nicotine dependence, cigarettes, uncomplicated: Secondary | ICD-10-CM | POA: Diagnosis present

## 2023-06-05 DIAGNOSIS — Z634 Disappearance and death of family member: Secondary | ICD-10-CM

## 2023-06-05 DIAGNOSIS — R630 Anorexia: Secondary | ICD-10-CM | POA: Diagnosis present

## 2023-06-05 DIAGNOSIS — C7951 Secondary malignant neoplasm of bone: Principal | ICD-10-CM

## 2023-06-05 DIAGNOSIS — Z7952 Long term (current) use of systemic steroids: Secondary | ICD-10-CM

## 2023-06-05 DIAGNOSIS — R52 Pain, unspecified: Secondary | ICD-10-CM | POA: Diagnosis present

## 2023-06-05 DIAGNOSIS — G893 Neoplasm related pain (acute) (chronic): Secondary | ICD-10-CM | POA: Diagnosis present

## 2023-06-05 DIAGNOSIS — R112 Nausea with vomiting, unspecified: Principal | ICD-10-CM

## 2023-06-05 DIAGNOSIS — Z91030 Bee allergy status: Secondary | ICD-10-CM

## 2023-06-05 DIAGNOSIS — Z170 Estrogen receptor positive status [ER+]: Secondary | ICD-10-CM

## 2023-06-05 DIAGNOSIS — Z79891 Long term (current) use of opiate analgesic: Secondary | ICD-10-CM

## 2023-06-05 DIAGNOSIS — C50412 Malignant neoplasm of upper-outer quadrant of left female breast: Secondary | ICD-10-CM

## 2023-06-05 DIAGNOSIS — Z66 Do not resuscitate: Secondary | ICD-10-CM | POA: Diagnosis present

## 2023-06-05 DIAGNOSIS — R310 Gross hematuria: Secondary | ICD-10-CM | POA: Diagnosis not present

## 2023-06-05 DIAGNOSIS — Z9011 Acquired absence of right breast and nipple: Secondary | ICD-10-CM

## 2023-06-05 DIAGNOSIS — N179 Acute kidney failure, unspecified: Secondary | ICD-10-CM

## 2023-06-05 DIAGNOSIS — Z8249 Family history of ischemic heart disease and other diseases of the circulatory system: Secondary | ICD-10-CM

## 2023-06-05 DIAGNOSIS — M1990 Unspecified osteoarthritis, unspecified site: Secondary | ICD-10-CM | POA: Diagnosis present

## 2023-06-05 DIAGNOSIS — C50811 Malignant neoplasm of overlapping sites of right female breast: Secondary | ICD-10-CM | POA: Diagnosis present

## 2023-06-05 DIAGNOSIS — E875 Hyperkalemia: Secondary | ICD-10-CM | POA: Diagnosis present

## 2023-06-05 DIAGNOSIS — Z5941 Food insecurity: Secondary | ICD-10-CM

## 2023-06-05 DIAGNOSIS — Z515 Encounter for palliative care: Secondary | ICD-10-CM

## 2023-06-05 DIAGNOSIS — I10 Essential (primary) hypertension: Secondary | ICD-10-CM

## 2023-06-05 DIAGNOSIS — D509 Iron deficiency anemia, unspecified: Secondary | ICD-10-CM | POA: Diagnosis present

## 2023-06-05 DIAGNOSIS — R159 Full incontinence of feces: Secondary | ICD-10-CM | POA: Diagnosis present

## 2023-06-05 DIAGNOSIS — M25552 Pain in left hip: Secondary | ICD-10-CM | POA: Diagnosis present

## 2023-06-05 DIAGNOSIS — E871 Hypo-osmolality and hyponatremia: Secondary | ICD-10-CM

## 2023-06-05 DIAGNOSIS — E860 Dehydration: Secondary | ICD-10-CM | POA: Diagnosis present

## 2023-06-05 DIAGNOSIS — Z993 Dependence on wheelchair: Secondary | ICD-10-CM

## 2023-06-05 DIAGNOSIS — K567 Ileus, unspecified: Secondary | ICD-10-CM | POA: Diagnosis not present

## 2023-06-05 DIAGNOSIS — R627 Adult failure to thrive: Secondary | ICD-10-CM | POA: Diagnosis present

## 2023-06-05 DIAGNOSIS — G8929 Other chronic pain: Secondary | ICD-10-CM | POA: Insufficient documentation

## 2023-06-05 DIAGNOSIS — J9601 Acute respiratory failure with hypoxia: Secondary | ICD-10-CM

## 2023-06-05 DIAGNOSIS — M84454A Pathological fracture, pelvis, initial encounter for fracture: Secondary | ICD-10-CM | POA: Insufficient documentation

## 2023-06-05 DIAGNOSIS — Z751 Person awaiting admission to adequate facility elsewhere: Secondary | ICD-10-CM

## 2023-06-05 DIAGNOSIS — R54 Age-related physical debility: Secondary | ICD-10-CM | POA: Diagnosis present

## 2023-06-05 DIAGNOSIS — Z6823 Body mass index (BMI) 23.0-23.9, adult: Secondary | ICD-10-CM

## 2023-06-05 DIAGNOSIS — R600 Localized edema: Secondary | ICD-10-CM

## 2023-06-05 DIAGNOSIS — K219 Gastro-esophageal reflux disease without esophagitis: Secondary | ICD-10-CM | POA: Diagnosis present

## 2023-06-05 DIAGNOSIS — M25559 Pain in unspecified hip: Secondary | ICD-10-CM

## 2023-06-05 DIAGNOSIS — E861 Hypovolemia: Secondary | ICD-10-CM | POA: Diagnosis present

## 2023-06-05 DIAGNOSIS — R110 Nausea: Secondary | ICD-10-CM

## 2023-06-05 DIAGNOSIS — Z79899 Other long term (current) drug therapy: Secondary | ICD-10-CM

## 2023-06-05 DIAGNOSIS — R601 Generalized edema: Secondary | ICD-10-CM | POA: Diagnosis present

## 2023-06-05 DIAGNOSIS — D61818 Other pancytopenia: Secondary | ICD-10-CM | POA: Diagnosis present

## 2023-06-05 DIAGNOSIS — Z79811 Long term (current) use of aromatase inhibitors: Secondary | ICD-10-CM

## 2023-06-05 DIAGNOSIS — Z885 Allergy status to narcotic agent status: Secondary | ICD-10-CM

## 2023-06-05 DIAGNOSIS — Z923 Personal history of irradiation: Secondary | ICD-10-CM

## 2023-06-05 DIAGNOSIS — Z853 Personal history of malignant neoplasm of breast: Secondary | ICD-10-CM

## 2023-06-05 DIAGNOSIS — Z79810 Long term (current) use of selective estrogen receptor modulators (SERMs): Secondary | ICD-10-CM

## 2023-06-05 LAB — COMPREHENSIVE METABOLIC PANEL
ALT: 23 U/L (ref 0–44)
AST: 28 U/L (ref 15–41)
Albumin: 3.4 g/dL — ABNORMAL LOW (ref 3.5–5.0)
Alkaline Phosphatase: 69 U/L (ref 38–126)
Anion gap: 10 (ref 5–15)
BUN: 38 mg/dL — ABNORMAL HIGH (ref 8–23)
CO2: 25 mmol/L (ref 22–32)
Calcium: 11.7 mg/dL — ABNORMAL HIGH (ref 8.9–10.3)
Chloride: 98 mmol/L (ref 98–111)
Creatinine, Ser: 1.77 mg/dL — ABNORMAL HIGH (ref 0.44–1.00)
GFR, Estimated: 31 mL/min — ABNORMAL LOW (ref >60)
Glucose, Bld: 129 mg/dL — ABNORMAL HIGH (ref 70–99)
Potassium: 4.1 mmol/L (ref 3.5–5.1)
Sodium: 133 mmol/L — ABNORMAL LOW (ref 135–145)
Total Bilirubin: 0.6 mg/dL (ref <1.2)
Total Protein: 6.3 g/dL — ABNORMAL LOW (ref 6.5–8.1)

## 2023-06-05 LAB — RAD ONC ARIA SESSION SUMMARY
Course Elapsed Days: 7
Plan Fractions Treated to Date: 4
Plan Fractions Treated to Date: 4
Plan Fractions Treated to Date: 4
Plan Prescribed Dose Per Fraction: 3 Gy
Plan Prescribed Dose Per Fraction: 3 Gy
Plan Prescribed Dose Per Fraction: 3 Gy
Plan Total Fractions Prescribed: 10
Plan Total Fractions Prescribed: 10
Plan Total Fractions Prescribed: 10
Plan Total Prescribed Dose: 30 Gy
Plan Total Prescribed Dose: 30 Gy
Plan Total Prescribed Dose: 30 Gy
Reference Point Dosage Given to Date: 12 Gy
Reference Point Dosage Given to Date: 12 Gy
Reference Point Dosage Given to Date: 12 Gy
Reference Point Session Dosage Given: 3 Gy
Reference Point Session Dosage Given: 3 Gy
Reference Point Session Dosage Given: 3 Gy
Session Number: 4

## 2023-06-05 LAB — CBC WITH DIFFERENTIAL/PLATELET
Abs Immature Granulocytes: 0.06 10*3/uL (ref 0.00–0.07)
Basophils Absolute: 0 10*3/uL (ref 0.0–0.1)
Basophils Relative: 1 %
Eosinophils Absolute: 0 10*3/uL (ref 0.0–0.5)
Eosinophils Relative: 0 %
HCT: 37.8 % (ref 36.0–46.0)
Hemoglobin: 12.8 g/dL (ref 12.0–15.0)
Immature Granulocytes: 1 %
Lymphocytes Relative: 7 %
Lymphs Abs: 0.4 10*3/uL — ABNORMAL LOW (ref 0.7–4.0)
MCH: 31.2 pg (ref 26.0–34.0)
MCHC: 33.9 g/dL (ref 30.0–36.0)
MCV: 92.2 fL (ref 80.0–100.0)
Monocytes Absolute: 0.3 10*3/uL (ref 0.1–1.0)
Monocytes Relative: 5 %
Neutro Abs: 4.2 10*3/uL (ref 1.7–7.7)
Neutrophils Relative %: 86 %
Platelets: 110 10*3/uL — ABNORMAL LOW (ref 150–400)
RBC: 4.1 MIL/uL (ref 3.87–5.11)
RDW: 13.5 % (ref 11.5–15.5)
WBC: 4.9 10*3/uL (ref 4.0–10.5)
nRBC: 0 % (ref 0.0–0.2)

## 2023-06-05 LAB — URINALYSIS, ROUTINE W REFLEX MICROSCOPIC
Bilirubin Urine: NEGATIVE
Glucose, UA: NEGATIVE mg/dL
Hgb urine dipstick: NEGATIVE
Ketones, ur: NEGATIVE mg/dL
Leukocytes,Ua: NEGATIVE
Nitrite: NEGATIVE
Protein, ur: NEGATIVE mg/dL
Specific Gravity, Urine: 1.006 (ref 1.005–1.030)
pH: 5 (ref 5.0–8.0)

## 2023-06-05 LAB — CBG MONITORING, ED: Glucose-Capillary: 114 mg/dL — ABNORMAL HIGH (ref 70–99)

## 2023-06-05 LAB — LIPASE, BLOOD: Lipase: 25 U/L (ref 11–51)

## 2023-06-05 LAB — CYTOLOGY - NON PAP

## 2023-06-05 LAB — MAGNESIUM: Magnesium: 2.3 mg/dL (ref 1.7–2.4)

## 2023-06-05 MED ORDER — OXYCODONE HCL 5 MG PO TABS: 5.0000 mg | ORAL_TABLET | ORAL | Status: DC | PRN

## 2023-06-05 MED ORDER — OXYCODONE HCL ER 10 MG PO T12A
10.0000 mg | EXTENDED_RELEASE_TABLET | Freq: Two times a day (BID) | ORAL | Status: DC
  Administered 2023-06-05 – 2023-06-07 (×5): 10 mg via ORAL
  Filled 2023-06-05 (×5): qty 1

## 2023-06-05 MED ORDER — MORPHINE SULFATE (PF) 2 MG/ML IV SOLN
2.0000 mg | INTRAVENOUS | Status: DC | PRN
  Administered 2023-06-08 – 2023-06-12 (×6): 2 mg via INTRAVENOUS
  Filled 2023-06-05 (×7): qty 1

## 2023-06-05 MED ORDER — SODIUM CHLORIDE 0.9 % IV BOLUS
500.0000 mL | Freq: Once | INTRAVENOUS | Status: AC
  Administered 2023-06-05: 500 mL via INTRAVENOUS

## 2023-06-05 MED ORDER — HYDROMORPHONE HCL 1 MG/ML IJ SOLN
0.5000 mg | Freq: Once | INTRAMUSCULAR | Status: AC
  Administered 2023-06-05: 0.5 mg via INTRAVENOUS
  Filled 2023-06-05: qty 1

## 2023-06-05 MED ORDER — ENOXAPARIN SODIUM 30 MG/0.3ML IJ SOSY
30.0000 mg | PREFILLED_SYRINGE | INTRAMUSCULAR | Status: DC
  Administered 2023-06-05 – 2023-06-07 (×3): 30 mg via SUBCUTANEOUS
  Filled 2023-06-05 (×3): qty 0.3

## 2023-06-05 MED ORDER — AMLODIPINE BESYLATE 5 MG PO TABS
5.0000 mg | ORAL_TABLET | Freq: Every day | ORAL | Status: DC
  Administered 2023-06-06 – 2023-07-02 (×19): 5 mg via ORAL
  Filled 2023-06-05 (×27): qty 1

## 2023-06-05 MED ORDER — TAMOXIFEN CITRATE 10 MG PO TABS
20.0000 mg | ORAL_TABLET | Freq: Every day | ORAL | Status: DC
  Administered 2023-06-06 – 2023-06-12 (×7): 20 mg via ORAL
  Filled 2023-06-05 (×7): qty 2

## 2023-06-05 MED ORDER — ONDANSETRON HCL 4 MG/2ML IJ SOLN
4.0000 mg | Freq: Once | INTRAMUSCULAR | Status: AC
  Administered 2023-06-05: 4 mg via INTRAVENOUS
  Filled 2023-06-05: qty 2

## 2023-06-05 MED ORDER — ONDANSETRON HCL 4 MG/2ML IJ SOLN
4.0000 mg | Freq: Four times a day (QID) | INTRAMUSCULAR | Status: DC | PRN
  Administered 2023-06-06 – 2023-07-02 (×15): 4 mg via INTRAVENOUS
  Filled 2023-06-05 (×16): qty 2

## 2023-06-05 MED ORDER — SENNOSIDES-DOCUSATE SODIUM 8.6-50 MG PO TABS
1.0000 | ORAL_TABLET | Freq: Two times a day (BID) | ORAL | Status: DC
  Administered 2023-06-05 – 2023-06-27 (×33): 1 via ORAL
  Filled 2023-06-05 (×42): qty 1

## 2023-06-05 MED ORDER — LACTATED RINGERS IV SOLN: INTRAVENOUS | Status: AC

## 2023-06-05 NOTE — H&P (Signed)
History and Physical    Patient: Darlene Beasley VWU:[id number] DOB: [birth year] DOA: 06/05/2023 DOS: the patient was seen and examined on 06/05/2023 PCP: Estevan Oaks, NP  Patient coming from: Home  Chief Complaint:  Chief Complaint  Patient presents with   Hip Pain   HPI: Darlene Beasley is a 67 y.o. female with medical history significant of breast cancer s/p left lumpectomy, right mastectomy, chemotherapy, radiation on antiestrogen therapy with new diffuse bone metastasis diagnosed in November who presents with worsening left hip pain and nausea and vomiting.   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 She went from using a cane to a walker and now mostly wheelchair bound. She has been on PRN 10mg  of oxycodone IR q4hr and has really been taking it scheduled with minimal relief. She was sent from palliative radiation today due to severe left hip pain. She is half way with her radiation and has one week remaining. She was just prescribed ER oxycodone by palliative care but just picked up today and has not started.   She also had nausea with dry heaves and had no solid food for the past week and a half. Denies shortness of breath or chest pain. No abdominal pain. No dysuria but has just noted dark urine. Noted bilateral LE edema around the same time but seems to be improving.   On arrival to ED, she was afebrile, HR high 90s, normotensive on room air.   CBC without leukocytosis or anemia but had thrombocytopenia of 110.  BMP notable for hyponatremia of 133, AKI with creatinine of 1.77, hypercalcemia of 11.7.  Pelvic x-ray demonstrated heavy burden of lytic osseous metastatic disease in the bony pelvis and right femur.  Pathological pelvic fracture difficult to exclude given heavy burden of osseous metastatic disease.  Stable sclerotic lesion in the left greater  trochanter likely a treated metastatic lesion.  No fracture of the left proximal femur.  She was administered fluid, IV 0.5mg  Dilaudid in ED.    Review of Systems: As mentioned in the history of present illness. All other systems reviewed and are negative. 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)   Personal history of chemotherapy    Personal history of radiation therapy    Past Surgical History:  Procedure Laterality Date   BREAST LUMPECTOMY Left 04/03/2018   BREAST LUMPECTOMY WITH RADIOACTIVE SEED AND SENTINEL LYMPH NODE BIOPSY Left 04/03/2018   Procedure: LEFT BREAST LUMPECTOMY WITH RADIOACTIVE SEED AND LEFT SENTINEL LYMPH NODE BIOPSY;  Surgeon: Claud Kelp, MD;  Location: [facility];  Service: General;  Laterality: Left;   IR IMAGING GUIDED PORT INSERTION  10/20/2017   IR US GUIDE VASC ACCESS LEFT  10/20/2017   MASTECTOMY Right 04/03/2018   MASTECTOMY MODIFIED RADICAL Right 04/03/2018   Procedure: MASTECTOMY MODIFIED RADICAL;  Surgeon: Claud Kelp, MD;  Location: [facility];  Service: General;  Laterality: Right;   PORT-A-CATH REMOVAL N/A 04/03/2018   Procedure: REMOVAL PORT-A-CATH;  Surgeon: Claud Kelp, MD;  Location: [facility];  Service: General;  Laterality: N/A;   TONSILLECTOMY Bilateral    TUBAL LIGATION     Social History:  reports that she has been smoking cigarettes. She has a 25.5 pack-year smoking history. She has never used smokeless tobacco. She reports current alcohol use of about 1.0 standard drink of alcohol per week. She reports current drug use. Drug: Marijuana.  Allergies  Allergen Reactions   Bee Venom     Tramadol Swelling    Throat swelling    Family History  Adopted: Yes  Problem Relation Age of Onset   Congestive Heart Failure Mother    Congestive Heart Failure Father     Prior to Admission medications   Medication Sig Start Date End Date Taking? Authorizing Provider  amLODipine (NORVASC) 5 MG tablet Take 1 tablet (5 mg total) by mouth daily. 02/19/23   Pollyann Samples, NP  dexamethasone (DECADRON) 4 MG tablet Take 1 tablet (4 mg total) by mouth 2 (two) times daily with a meal. 06/02/23   Pickenpack-Cousar, Arty Baumgartner, NP  docusate sodium (COLACE) 50 MG capsule Take 50 mg by mouth 2 (two) times daily.    [provider]  ondansetron (ZOFRAN) 8 MG tablet Take 0.5 tablets (4 mg total) by mouth every 8 (eight) hours as needed for nausea or vomiting. 05/26/23   Pickenpack-Cousar, Arty Baumgartner, NP  oxyCODONE (OXY IR/ROXICODONE) 5 MG immediate release tablet Take 1-2 tablets (5-10 mg total) by mouth every 4 (four) hours as needed for severe pain (pain score 7-10) or breakthrough pain. 06/02/23   Pickenpack-Cousar, Arty Baumgartner, NP  oxyCODONE ER (XTAMPZA ER) 9 MG C12A Take 9 mg by mouth every 12 (twelve) hours. 06/02/23   Pickenpack-Cousar, Arty Baumgartner, NP  prochlorperazine (COMPAZINE) 10 MG tablet Take 1 tablet (10 mg total) by mouth every 6 (six) hours as needed for nausea or vomiting. 05/30/23   Pickenpack-Cousar, Arty Baumgartner, NP  tamoxifen (NOLVADEX) 20 MG tablet Take 1 tablet (20 mg total) by mouth daily. 02/19/23   Pollyann Samples, NP    Physical Exam: Vitals:   06/05/23 1644 06/05/23 1645 06/05/23 1800 06/05/23 1925  BP:  120/82 126/73 125/77  Pulse:  96 95 93  Resp:  15 10 11   Temp:  98.1 F (36.7 C)  97.7 F (36.5 C)  TempSrc:    Oral  SpO2:  97% 90% 100%  Weight: 66.7 kg      Constitutional: NAD, calm, comfortable, elderly female lying in bed Eyes: lids and conjunctivae normal ENMT: Mucous membranes are moist.  Neck: normal, supple Respiratory: clear to auscultation bilaterally,  no wheezing, no crackles. Normal respiratory effort. No accessory muscle use.  Cardiovascular: Regular rate and rhythm, no murmurs / rubs / gallops. +1 pitting edema of bilateral LE to distal ankle.  Abdomen: no tenderness, soft Musculoskeletal: no clubbing / cyanosis. No joint deformity upper and lower extremities. Normal muscle tone. Significant tenderness with mild palpation of left hip and pelvic region. Skin: no rashes, lesions, ulcers. No induration Neurologic: CN 2-12 grossly intact.  Psychiatric: Normal judgment and insight. Alert and oriented x 3. Normal mood.   Data Reviewed:  See HPI  Assessment and Plan: * Hip pain -Secondary to significant diffuse and extensive pelvic bone and hip metastasis. S/p left iliac bone biopsy 12/24 and awaiting results.  Pain today mostly to the left hip for which she is currently receiving palliative radiation since last week. -Has been taking PRN oxycodone IR q4hr which we'll continue  -Will start oxycodone ER q12hr -  IV morphine for breakthrough pain -scheduled bowel regimen -Judicious use of opioids as patient had acute hypoxia following IV Dilaudid administration.  She also is hesitant in taking high-dose opioids especially fentanyl because her son passed away from drug overdose 2 years ago. -pelvic X-ray could not exclude pathologic fracture of the right hip due to diffuse metastasis.  This was also noted on recent CT pelvis earlier this month.  She reports discussion of prophylactic fixation of the right hip after radiation with oncology. Given that patient has no worsening pain on the right side will hold off on any further imaging.  Cancer of overlapping sites of right female breast [facility]) -breast cancer s/p left lumpectomy, right mastectomy, chemotherapy, radiation on antiestrogen therapy -Continue tamoxifen  HTN (hypertension) -continue amlodipine  Bilateral leg edema -symptoms started the same timeframe as her nausea, dry heaves in the  setting of new palliative radiation -no other symptoms to suggest cardiac etiology. Lower likelihood of bilateral DVT.  -will arise elevation of LE. Unfortunately not able to dose Lasix with AKI and dehydration  Acute hypoxic respiratory failure (HCC) -secondary to administrating of IV opioid for pain -Continue to monitor and wean as tolerated with goal O2 greater than 92% -Judicious use of  opioids for pain with close monitoring  Nausea & vomiting -possibly anorexia from radiation therapy. LFTs and lipase are within normal limits. No fever or leukocytosis to suggest infection.  -PRN antiemetic.   AKI (acute kidney injury) (HCC) Creatinine elevated to 1.77. Pre-renal due to decrease oral intake -Keep on continuous IV fluids overnight  Hypercalcemia -corrected Ca of 11.8.  -Will hydrate overnight and follow trend in the morning. If it remains elevated, will need calcitonin and bisphosphonate.  Hyponatremia - Secondary to decreased oral intake - Keep on continuous IV fluids overnight      Advance Care Planning: Full  Consults: none  Family Communication: husband at bedside  Severity of Illness: The appropriate patient status for this patient is OBSERVATION. Observation status is judged to be reasonable and necessary in order to provide the required intensity of service to ensure the patient's safety. The patient's presenting symptoms, physical exam findings, and initial radiographic and laboratory data in the context of their medical condition is felt to place them at decreased risk for further clinical deterioration. Furthermore, it is anticipated that the patient will be medically stable for discharge from the hospital within 2 midnights of admission.   Author: Anselm Jungling, DO 06/05/2023 9:54 PM  For on call review [email or web address].

## 2023-06-05 NOTE — Assessment & Plan Note (Signed)
-   continue amlodipine. 

## 2023-06-05 NOTE — Assessment & Plan Note (Signed)
-  symptoms started the same timeframe as her nausea, dry heaves in the setting of new palliative radiation -no other symptoms to suggest cardiac etiology. Lower likelihood of bilateral DVT.  -will arise elevation of LE. Unfortunately not able to dose Lasix with AKI and dehydration

## 2023-06-05 NOTE — Progress Notes (Signed)
Pt seen with metastatic breast cancer currently receiving mx site radiotherapy to the bones; she was seen at treatment machine complaining of progressive difficulties managing pain at home. Pt states she's been struggling to get up and even go to the bathroom and her urine is dark yellow, our staff noted incontinence that is brown, and she has not been eating or drinking well in the last week. States no solid food in the last day or so due to progressive nausea, and feelings of dry heaving. She has also noticed what was previously mild lower extremity edema is now profound, L>R.   Noted clinicially is a chronically ill appearing caucasian female in no acute distress. She's alert and oriented x4 and appropriate throughout the examination. Cardiopulmonary assessment is negative for acute distress and she exhibits normal effort.  Lower extremities are edematous L 2+, R 1+.    We discussed the rationale for her to get worked up for what appears to be failure to thrive, but to rule out reversible kidney injury, and rule out DVT/PE. Our team will take her today to the ED after speaking with charge nurse by phone.       Osker Mason, PAC

## 2023-06-05 NOTE — ED Provider Notes (Signed)
[place] EMERGENCY DEPARTMENT AT [facility] Provider Note   CSN: [id number] Arrival date & time: 06/05/23  1639     History  Chief Complaint  Patient presents with   Hip Pain    Darlene Beasley is a 67 y.o. female with PMH as listed below who presents with L hip pain and N/V.  +N/V that started earlier this week. Decreased PO intake x 10 days. Feels very dehydrated. No f/c, abdominal pain, falls. No urinary sxs, just dark. No diarrhea/constipation.  Has breast cancer w/ mets to hips/spine. Gets radiation to both hips and spine. Pain in left hip started in October, progressively worsening until now. Had to start using a walker at end of November. Now pain is so severe she "can't do anything." Denies any weakness in the legs, just states she can't walk because it is so painful. Taking oxycodone 10 mg every 4 hours, and was prescribed [place] on 06/02/23 but hasn't picked it up yet. Not currently receiving chemotherapy, just radiation. No procedures or biopsies done to that hip recently. Doesn't take tylenol for pain.  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 Authorizing Provider  amLODipine (NORVASC) 5 MG tablet Take 1 tablet (5 mg total) by mouth daily. 02/19/23   Pollyann Samples, NP  dexamethasone (DECADRON) 4 MG tablet Take 1 tablet (4 mg total) by mouth 2 (two) times daily with a meal. 06/02/23   Pickenpack-Cousar, Arty Baumgartner, NP  docusate sodium (COLACE) 50 MG capsule Take 50 mg by mouth 2 (two)  times daily.    [provider]  ondansetron (ZOFRAN) 8 MG tablet Take 0.5 tablets (4 mg total) by mouth every 8 (eight) hours as needed for nausea or vomiting. 05/26/23   Pickenpack-Cousar, Arty Baumgartner, NP  oxyCODONE (OXY IR/ROXICODONE) 5 MG immediate release tablet Take 1-2 tablets (5-10 mg total) by mouth every 4 (four) hours as needed for severe pain (pain score 7-10) or breakthrough pain. 06/02/23   Pickenpack-Cousar, Arty Baumgartner, NP  oxyCODONE ER (XTAMPZA ER) 9 MG C12A Take 9 mg by mouth every 12 (twelve) hours. 06/02/23   Pickenpack-Cousar, Arty Baumgartner, NP  prochlorperazine (COMPAZINE) 10 MG tablet Take 1 tablet (10 mg total) by mouth every 6 (six) hours as needed for nausea or vomiting. 05/30/23   Pickenpack-Cousar, Arty Baumgartner, NP  tamoxifen (NOLVADEX) 20 MG tablet Take 1 tablet (20 mg total) by mouth daily. 02/19/23   Pollyann Samples, NP      Allergies    Bee venom and Tramadol    Review of Systems   Review of Systems A 10 point review of systems was performed and is negative unless otherwise reported in HPI.  Physical Exam Updated Vital Signs BP 125/77   Pulse 93   Temp 97.7 F (36.5 C) (Oral)   Resp 11   Wt 66.7 kg   SpO2 100%   BMI 25.23 kg/m  Physical Exam General: Normal appearing female, lying in bed. Actively  vomiting. HEENT: PERRLA, Sclera anicteric, dry mucous membranes, trachea midline.  Cardiology: RRR, no murmurs/rubs/gallops.  Resp: Normal respiratory rate and effort. CTAB, no wheezes, rhonchi, crackles.  Abd: Soft, non-tender, non-distended. No rebound tenderness or guarding.  GU: Deferred. MSK: No pain to palpation of L hip, no overlying deformities or obvious abnormalities. No erythema, induration, fluctuance. Pain with passive and active ROM of L hip joint. 2+ pitting edema symmetric in BL ankles. Intact DP/PT pulses bilaterally.  Skin: warm, dry.  Neuro: A&Ox4, CNs II-XII grossly intact. MAEs. Sensation grossly intact.  Psych: Normal mood and affect.   ED  Results / Procedures / Treatments   Labs (all labs ordered are listed, but only abnormal results are displayed) Labs Reviewed  CBC WITH DIFFERENTIAL/PLATELET - Abnormal; Notable for the following components:      Result Value   Platelets 110 (*)    Lymphs Abs 0.4 (*)    All other components within normal limits  COMPREHENSIVE METABOLIC PANEL - Abnormal; Notable for the following components:   Sodium 133 (*)    Glucose, Bld 129 (*)    BUN 38 (*)    Creatinine, Ser 1.77 (*)    Calcium 11.7 (*)    Total Protein 6.3 (*)    Albumin 3.4 (*)    GFR, Estimated 31 (*)    All other components within normal limits  CBG MONITORING, ED - Abnormal; Notable for the following components:   Glucose-Capillary 114 (*)    All other components within normal limits  MAGNESIUM  LIPASE, BLOOD  URINALYSIS, ROUTINE W REFLEX MICROSCOPIC    EKG None  Radiology DG HIP UNILAT WITH PELVIS 2-3 VIEWS LEFT Result Date: 06/05/2023 CLINICAL DATA:  Left hip pain, metastatic breast cancer with skeletal involvement EXAM: DG HIP (WITH OR WITHOUT PELVIS) 2-3V LEFT COMPARISON:  CT pelvis 05/16/2023 FINDINGS: Stable sclerotic lesion in the left greater trochanter. This likely represents a treated metastatic lesion. No fracture the left proximal femur is identified. However, there is ill-defined abnormal lucency scattered throughout the bony pelvis most confluent in the left iliac bone and left sacrum compatible with lytic metastatic lesions and better shown on the CT from 05/16/2023. Pathologic pelvic fracture difficult to exclude given the heavy burden of osseous metastatic disease. There is lytic involvement in the right (contralateral) femoral head, femoral neck, and proximal metaphysis. IMPRESSION: 1. Heavy burden of lytic osseous metastatic disease in the bony pelvis and right (contralateral) femur. Pathologic pelvic fracture difficult to exclude given the heavy burden of osseous metastatic disease. 2. Stable sclerotic  lesion in the left greater trochanter, likely a treated metastatic lesion. 3. No fracture of the left proximal femur is identified. Electronically Signed   By: Gaylyn Rong M.D.   On: 06/05/2023 18:11    Procedures Procedures    Medications Ordered in ED Medications  ondansetron (ZOFRAN) injection 4 mg (4 mg Intravenous Given 06/05/23 1757)  HYDROmorphone (DILAUDID) injection 0.5 mg (0.5 mg Intravenous Given 06/05/23 1754)  sodium chloride 0.9 % bolus 500 mL (0 mLs Intravenous Stopped 06/05/23 1912)    ED Course/ Medical Decision Making/ A&P                          Medical Decision Making Amount and/or Complexity of Data Reviewed Labs: ordered. Decision-making details documented in ED Course. Radiology: ordered. Decision-making details documented in ED Course.  Risk Prescription drug management. Decision regarding hospitalization.    This patient presents to the ED for  concern of N/V, and worsening L hip pain, this involves an extensive number of treatment options, and is a complaint that carries with it a high risk of complications and morbidity.  I considered the following differential and admission for this acute, potentially life threatening condition.   MDM:    DDX for this patient's nausea/vomiting includes but is not limited to:  Consider acute life-threatening diagnoses including ACS, DKA. She has no abdominal pain to indicate mesenteric ischemia, ruptured viscus, intra-abdominal infections including appendicitis, biliary disease such as cholecystitis or cholelithiasis, cystitis/pyelonephritis, diverticulitis, small bowel obstruction or ileus, pancreatitis,  gastritis/GERD/PUD, nephrolithiasis/ureterolithiasis. Consider gastroenteritis, gastroparesis, or electrolyte abnormalities or renal injury due to dehydration/hypovolemia.  With her left hip pain, consider pain d/t known cancer mets in that area. No f/c or leukocytosis to indicate septic arthritis. Consider  occult fracture however this pain has been steadily worsening for months. Could consider CT or MRI of left hip. Will likely admit for pain control, antiemetics, fluids.  Clinical Course as of 06/05/23 2052  Thu Jun 05, 2023  1839 DG HIP UNILAT WITH PELVIS 2-3 VIEWS LEFT 1. Heavy burden of lytic osseous metastatic disease in the bony pelvis and right (contralateral) femur. Pathologic pelvic fracture difficult to exclude given the heavy burden of osseous metastatic disease. 2. Stable sclerotic lesion in the left greater trochanter, likely a treated metastatic lesion. 3. No fracture of the left proximal femur is identified.   [HN]  1907 Creatinine(!): 1.77 +AKI, likely prerenal in s/o N/V/dehydration [HN]  1907 Consulting to oncology, will likely admit [HN]  1929 Spoke with the oncologist on call who states the hospitalist can call them if they need them.  [HN]  2051 Patient did become mildly hypoxic while sleeping after dilaudid was given. She has no SOB, doesn't wear O2 at baseline, no bradypnea/apnea but suspect likely d/t dilaudid and rest. No reported cough, CP. Placed on 2L [facility] and will CTM.  [HN]    Clinical Course User Index [HN] Loetta Rough, MD    Labs: I Ordered, and personally interpreted labs.  The pertinent results include:  those listed above  Imaging Studies ordered: I ordered imaging studies including XR L hip I independently visualized and interpreted imaging. I agree with the radiologist interpretation  Additional history obtained from chart review.    Cardiac Monitoring: The patient was maintained on a cardiac monitor.  I personally viewed and interpreted the cardiac monitored which showed an underlying rhythm of: NSR  Reevaluation: After the interventions noted above, I reevaluated the patient and found that they have :improved  Social Determinants of Health: Lives independently  Disposition:  Admit to hospitalist  Co morbidities that complicate the  patient evaluation  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)   Personal history of chemotherapy    Personal history of radiation therapy      Medicines Meds ordered this encounter  Medications   ondansetron (ZOFRAN) injection 4 mg   HYDROmorphone (DILAUDID) injection 0.5 mg   sodium chloride 0.9 % bolus 500 mL    I have reviewed the patients home medicines and have made adjustments as needed  Problem List / ED Course: Problem List Items  Addressed This Visit   None Visit Diagnoses       Nausea and vomiting, unspecified vomiting type    -  Primary     Chronic left hip pain       Relevant Medications   HYDROmorphone (DILAUDID) injection 0.5 mg (Completed)     AKI (acute kidney injury) (HCC)         Dehydration                       This note was created using dictation software, which may contain spelling or grammatical errors.    Loetta Rough, MD 06/05/23 2052

## 2023-06-05 NOTE — Assessment & Plan Note (Signed)
-   Secondary to decreased oral intake - Keep on continuous IV fluids overnight

## 2023-06-05 NOTE — Assessment & Plan Note (Addendum)
-  corrected Ca of 11.8.  -Will hydrate overnight and follow trend in the morning. If it remains elevated, will need calcitonin and bisphosphonate.

## 2023-06-05 NOTE — ED Notes (Signed)
Attempted pt to use restroom for urinalysis, but pt says she can't go & "there is nothing there"; NT will attempt again soon

## 2023-06-05 NOTE — Assessment & Plan Note (Signed)
-  breast cancer s/p left lumpectomy, right mastectomy, chemotherapy, radiation on antiestrogen therapy -Continue tamoxifen

## 2023-06-05 NOTE — Assessment & Plan Note (Signed)
-  possibly anorexia from radiation therapy. LFTs and lipase are within normal limits. No fever or leukocytosis to suggest infection.  -PRN antiemetic.

## 2023-06-05 NOTE — Assessment & Plan Note (Signed)
Creatinine elevated to 1.77. Pre-renal due to decrease oral intake -Keep on continuous IV fluids overnight

## 2023-06-05 NOTE — Progress Notes (Signed)
Appointment with Dr. Mosetta Putt 06/12/2023 to discuss

## 2023-06-05 NOTE — Progress Notes (Deleted)
Palliative Medicine [facility]  [phone] Fax:([phone]) [phone]   Name: Darlene Beasley Date: 06/05/2023 MRN: [id number]  DOB: 03/25/1956  Patient Care Team: Estevan Oaks, NP as PCP - General (Nurse Practitioner) Claud Kelp, MD as Consulting Physician (General Surgery) Malachy Mood, MD as Consulting Physician (Hematology) Dorothy Puffer, MD as Consulting Physician (Radiation Oncology) Pollyann Samples, NP as Nurse Practitioner (Nurse Practitioner)    INTERVAL HISTORY: AHRIANA CZARNY is a 67 y.o. 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 Palliative ask to see for symptom management and goals of care.   SOCIAL HISTORY:     reports that she has been smoking cigarettes. She has a 25.5 pack-year smoking history. She has never used smokeless tobacco. She reports current alcohol use of about 1.0 standard drink of alcohol per week. She reports current drug use. Drug: Marijuana.  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  MEDICATIONS:  Current Outpatient Medications  Medication Sig Dispense Refill   amLODipine (NORVASC) 5 MG tablet Take 1 tablet (5 mg total) by mouth daily. 90 tablet 1   dexamethasone (DECADRON) 4 MG tablet Take 1 tablet (4 mg total) by mouth 2 (two) times daily with a meal. 21 tablet 0   docusate sodium (COLACE) 50 MG capsule Take 50 mg  by mouth 2 (two) times daily.     ondansetron (ZOFRAN) 8 MG tablet Take 0.5 tablets (4 mg total) by mouth every 8 (eight) hours as needed for nausea or vomiting. 45 tablet 1   oxyCODONE (OXY IR/ROXICODONE) 5 MG immediate release tablet Take 1-2 tablets (5-10 mg total) by mouth every 4 (four) hours as needed for severe pain (pain score 7-10) or breakthrough pain. 90 tablet 0   oxyCODONE ER (XTAMPZA ER) 9 MG C12A Take 9 mg by mouth every 12 (twelve) hours. 30 capsule 0   prochlorperazine (COMPAZINE) 10 MG tablet Take 1 tablet (10 mg total) by mouth every 6 (six) hours as needed for nausea or vomiting. 30 tablet 3   tamoxifen (NOLVADEX) 20 MG tablet Take 1 tablet (20 mg total) by mouth daily. 90 tablet 3   No current facility-administered medications for this visit.    VITAL SIGNS: There were no vitals taken for this visit. There were no vitals filed for this visit.  Estimated body mass index is 25.23 kg/m as calculated from the following:   Height as of 06/03/23: 5\' 4"  (1.626 m).   Weight as of 06/03/23: 147 lb (66.7 kg).   PERFORMANCE STATUS (ECOG) : 1 - Symptomatic but completely ambulatory   Physical Exam General: NAD Cardiovascular: regular rate and rhythm Pulmonary: clear ant fields Abdomen: soft, nontender, + bowel sounds Extremities: no edema, no joint deformities Skin: no rashes Neurological:   IMPRESSION: ***  We discussed Darlene Beasley current illness and what it means in the larger context of  Darlene Beasley on-going co-morbidities. Natural disease trajectory and expectations were discussed.  I discussed the importance of continued conversation with family and their medical providers regarding overall plan of care and treatment options, ensuring decisions are within the context of the patients values and GOCs.  PLAN: Established therapeutic relationship. Education provided on palliative's role in collaboration with their Oncology/Radiation team.  Metastatic Bone Cancer Severe pain,  particularly with movement. Currently on Oxycodone 41m tablet TID as needed, but pain remains at a 7-8/10. Discussed the potential need for long-acting pain medication and increasing dose and frequency of oxycodone IR.  -Increase Oxycodone 5-10 mg every 6 hours as needed for severe pain. -Consider initiation of long-acting pain medication at follow-up if pain remains uncontrolled. -Offered short course of steroids however declined at this time by Significant Other. Will continue to follow and consider if needed. Declined Toradol injection in clinic.  -Prefers naturalistic approach and minimal medications if possible   Nausea and Decreased Appetite Nausea is severe, leading to decreased oral intake and weight loss. Currently on Ondansetron 4mg , but it is not providing sufficient relief. -Increase Ondansetron to 8mg  as needed for nausea. -Add Compazine as an alternate antiemetic if Ondansetron is not effective. -Consider short course of steroids to boost appetite and potentially alleviate pain if symptoms do not improve.  Constipation Likely secondary to opioid use. Currently using Colace with some improvement. -Continue Colace three times daily -Consider Senna-S 2 tablets at bedtime if worsening constipation  Radiation Therapy Scheduled to begin radiation therapy for bone metastases.  -Continue with scheduled radiation therapy. Reviewed upcoming schedule.  -Consider steroids during radiation therapy if symptoms do not improve.  Follow-up Plan to check in via phone Friday to assess symptom control and adjust treatment plan as necessary. Face-to-face appointment scheduled for January 2nd.   Patient expressed understanding and was in agreement with this plan. She also understands that She can call the clinic at any time with any questions, concerns, or complaints.   Any controlled substances utilized were prescribed in the context of palliative care. PDMP has been reviewed.    Visit  consisted of counseling and education dealing with the complex and emotionally intense issues of symptom management and palliative care in the setting of serious and potentially life-threatening illness.  Willette Alma, AGPCNP-BC  Palliative Medicine Team/[facility]  *Please note that this is a verbal dictation therefore any spelling or grammatical errors are due to the "Dragon Medical One" system interpretation.

## 2023-06-05 NOTE — ED Triage Notes (Signed)
Pt brought over from Radiation due to increased bone/ L hip pain due to cancer wit mets.

## 2023-06-05 NOTE — Assessment & Plan Note (Addendum)
-  Secondary to significant diffuse and extensive pelvic bone and hip metastasis. S/p left iliac bone biopsy 12/24 and awaiting results.  Pain today mostly to the left hip for which she is currently receiving palliative radiation since last week. -Has been taking PRN oxycodone IR q4hr which we'll continue  -Will start oxycodone ER q12hr -IV morphine for breakthrough pain -scheduled bowel regimen -Judicious use of opioids as patient had acute hypoxia following IV Dilaudid administration.  She also is hesitant in taking high-dose opioids especially fentanyl because her son passed away from drug overdose 2 years ago. -pelvic X-ray could not exclude pathologic fracture of the right hip due to diffuse metastasis.  This was also noted on recent CT pelvis earlier this month.  She reports discussion of prophylactic fixation of the right hip after radiation with oncology. Given that patient has no worsening pain on the right side will hold off on any further imaging.

## 2023-06-05 NOTE — Assessment & Plan Note (Signed)
-  secondary to administrating of IV opioid for pain -Continue to monitor and wean as tolerated with goal O2 greater than 92% -Judicious use of  opioids for pain with close monitoring

## 2023-06-05 NOTE — ED Notes (Signed)
Pt urinated via bedpan; no bladder scan needed per [employer]

## 2023-06-06 ENCOUNTER — Ambulatory Visit
Admission: RE | Admit: 2023-06-06 | Discharge: 2023-06-06 | Disposition: A | Discharge status: Home / Self Care | Payer: 59 | Source: Ambulatory Visit | Attending: Radiation Oncology

## 2023-06-06 ENCOUNTER — Other Ambulatory Visit: Payer: Self-pay | Admitting: Radiation Oncology

## 2023-06-06 ENCOUNTER — Other Ambulatory Visit: Payer: Self-pay

## 2023-06-06 ENCOUNTER — Ambulatory Visit: Payer: 59

## 2023-06-06 ENCOUNTER — Telehealth: Payer: Self-pay

## 2023-06-06 DIAGNOSIS — R112 Nausea with vomiting, unspecified: Secondary | ICD-10-CM | POA: Diagnosis not present

## 2023-06-06 DIAGNOSIS — E871 Hypo-osmolality and hyponatremia: Secondary | ICD-10-CM | POA: Diagnosis present

## 2023-06-06 DIAGNOSIS — R52 Pain, unspecified: Secondary | ICD-10-CM | POA: Diagnosis not present

## 2023-06-06 DIAGNOSIS — G893 Neoplasm related pain (acute) (chronic): Secondary | ICD-10-CM | POA: Diagnosis present

## 2023-06-06 DIAGNOSIS — R627 Adult failure to thrive: Secondary | ICD-10-CM | POA: Diagnosis present

## 2023-06-06 DIAGNOSIS — Z8249 Family history of ischemic heart disease and other diseases of the circulatory system: Secondary | ICD-10-CM | POA: Diagnosis not present

## 2023-06-06 DIAGNOSIS — E875 Hyperkalemia: Secondary | ICD-10-CM | POA: Diagnosis present

## 2023-06-06 DIAGNOSIS — I10 Essential (primary) hypertension: Secondary | ICD-10-CM | POA: Diagnosis present

## 2023-06-06 DIAGNOSIS — Z515 Encounter for palliative care: Secondary | ICD-10-CM | POA: Diagnosis not present

## 2023-06-06 DIAGNOSIS — M84550A Pathological fracture in neoplastic disease, pelvis, initial encounter for fracture: Secondary | ICD-10-CM | POA: Diagnosis not present

## 2023-06-06 DIAGNOSIS — F17210 Nicotine dependence, cigarettes, uncomplicated: Secondary | ICD-10-CM | POA: Diagnosis present

## 2023-06-06 DIAGNOSIS — D509 Iron deficiency anemia, unspecified: Secondary | ICD-10-CM | POA: Diagnosis present

## 2023-06-06 DIAGNOSIS — Z170 Estrogen receptor positive status [ER+]: Secondary | ICD-10-CM | POA: Diagnosis not present

## 2023-06-06 DIAGNOSIS — Z9221 Personal history of antineoplastic chemotherapy: Secondary | ICD-10-CM | POA: Diagnosis not present

## 2023-06-06 DIAGNOSIS — C50919 Malignant neoplasm of unspecified site of unspecified female breast: Secondary | ICD-10-CM | POA: Diagnosis not present

## 2023-06-06 DIAGNOSIS — D61818 Other pancytopenia: Secondary | ICD-10-CM | POA: Diagnosis present

## 2023-06-06 DIAGNOSIS — J9601 Acute respiratory failure with hypoxia: Secondary | ICD-10-CM | POA: Diagnosis present

## 2023-06-06 DIAGNOSIS — C7951 Secondary malignant neoplasm of bone: Secondary | ICD-10-CM | POA: Diagnosis present

## 2023-06-06 DIAGNOSIS — K567 Ileus, unspecified: Secondary | ICD-10-CM | POA: Diagnosis not present

## 2023-06-06 DIAGNOSIS — G8929 Other chronic pain: Secondary | ICD-10-CM | POA: Diagnosis not present

## 2023-06-06 DIAGNOSIS — E861 Hypovolemia: Secondary | ICD-10-CM | POA: Diagnosis present

## 2023-06-06 DIAGNOSIS — N179 Acute kidney failure, unspecified: Secondary | ICD-10-CM | POA: Diagnosis present

## 2023-06-06 DIAGNOSIS — Z7189 Other specified counseling: Secondary | ICD-10-CM | POA: Diagnosis not present

## 2023-06-06 DIAGNOSIS — Z79899 Other long term (current) drug therapy: Secondary | ICD-10-CM | POA: Diagnosis not present

## 2023-06-06 DIAGNOSIS — M84454A Pathological fracture, pelvis, initial encounter for fracture: Secondary | ICD-10-CM | POA: Diagnosis present

## 2023-06-06 DIAGNOSIS — C50811 Malignant neoplasm of overlapping sites of right female breast: Secondary | ICD-10-CM | POA: Diagnosis not present

## 2023-06-06 DIAGNOSIS — Z79811 Long term (current) use of aromatase inhibitors: Secondary | ICD-10-CM | POA: Diagnosis not present

## 2023-06-06 DIAGNOSIS — Z993 Dependence on wheelchair: Secondary | ICD-10-CM | POA: Diagnosis not present

## 2023-06-06 DIAGNOSIS — M25552 Pain in left hip: Secondary | ICD-10-CM | POA: Diagnosis present

## 2023-06-06 DIAGNOSIS — Z923 Personal history of irradiation: Secondary | ICD-10-CM | POA: Diagnosis not present

## 2023-06-06 DIAGNOSIS — C50412 Malignant neoplasm of upper-outer quadrant of left female breast: Secondary | ICD-10-CM | POA: Diagnosis not present

## 2023-06-06 DIAGNOSIS — Z66 Do not resuscitate: Secondary | ICD-10-CM | POA: Diagnosis present

## 2023-06-06 DIAGNOSIS — E860 Dehydration: Secondary | ICD-10-CM | POA: Diagnosis present

## 2023-06-06 LAB — RAD ONC ARIA SESSION SUMMARY
Course Elapsed Days: 8
Plan Fractions Treated to Date: 5
Plan Fractions Treated to Date: 5
Plan Fractions Treated to Date: 5
Plan Prescribed Dose Per Fraction: 3 Gy
Plan Prescribed Dose Per Fraction: 3 Gy
Plan Prescribed Dose Per Fraction: 3 Gy
Plan Total Fractions Prescribed: 10
Plan Total Fractions Prescribed: 10
Plan Total Fractions Prescribed: 10
Plan Total Prescribed Dose: 30 Gy
Plan Total Prescribed Dose: 30 Gy
Plan Total Prescribed Dose: 30 Gy
Reference Point Dosage Given to Date: 15 Gy
Reference Point Dosage Given to Date: 15 Gy
Reference Point Dosage Given to Date: 15 Gy
Reference Point Session Dosage Given: 3 Gy
Reference Point Session Dosage Given: 3 Gy
Reference Point Session Dosage Given: 3 Gy
Session Number: 5

## 2023-06-06 LAB — BASIC METABOLIC PANEL
Anion gap: 10 (ref 5–15)
BUN: 35 mg/dL — ABNORMAL HIGH (ref 8–23)
CO2: 25 mmol/L (ref 22–32)
Calcium: 11.4 mg/dL — ABNORMAL HIGH (ref 8.9–10.3)
Chloride: 101 mmol/L (ref 98–111)
Creatinine, Ser: 1.38 mg/dL — ABNORMAL HIGH (ref 0.44–1.00)
GFR, Estimated: 41 mL/min — ABNORMAL LOW (ref >60)
Glucose, Bld: 93 mg/dL (ref 70–99)
Potassium: 3.7 mmol/L (ref 3.5–5.1)
Sodium: 136 mmol/L (ref 135–145)

## 2023-06-06 LAB — CBC
HCT: 37.3 % (ref 36.0–46.0)
Hemoglobin: 12.5 g/dL (ref 12.0–15.0)
MCH: 31.4 pg (ref 26.0–34.0)
MCHC: 33.5 g/dL (ref 30.0–36.0)
MCV: 93.7 fL (ref 80.0–100.0)
Platelets: 93 10*3/uL — ABNORMAL LOW (ref 150–400)
RBC: 3.98 MIL/uL (ref 3.87–5.11)
RDW: 13.4 % (ref 11.5–15.5)
WBC: 2.7 10*3/uL — ABNORMAL LOW (ref 4.0–10.5)
nRBC: 0 % (ref 0.0–0.2)

## 2023-06-06 LAB — HIV ANTIBODY (ROUTINE TESTING W REFLEX): HIV Screen 4th Generation wRfx: NONREACTIVE

## 2023-06-06 MED ORDER — SODIUM CHLORIDE 0.9 % IV SOLN
12.5000 mg | Freq: Once | INTRAVENOUS | Status: AC
  Administered 2023-06-06: 12.5 mg via INTRAVENOUS
  Filled 2023-06-06: qty 12.5

## 2023-06-06 MED ORDER — ENSURE ENLIVE PO LIQD
237.0000 mL | Freq: Two times a day (BID) | ORAL | Status: DC
Start: 2023-06-06 — End: 2023-06-27
  Administered 2023-06-07 – 2023-06-26 (×18): 237 mL via ORAL

## 2023-06-06 NOTE — Care Management Obs Status (Signed)
MEDICARE OBSERVATION STATUS NOTIFICATION   Patient Details  Name: Darlene Beasley MRN: [id number] Date of Birth: 06-14-1955   Medicare Observation Status Notification Given:  Yes    Beckie Busing, RN 06/06/2023, 3:32 PM

## 2023-06-06 NOTE — Progress Notes (Signed)
PROGRESS NOTE    Darlene Beasley  ZOX:[id number] DOB: Jan 28, 1956 DOA: 06/05/2023 PCP: Estevan Oaks, NP   Brief Narrative:  HPI: Darlene Beasley is a 67 y.o. female with medical history significant of breast cancer s/p left lumpectomy, right mastectomy, chemotherapy, radiation on antiestrogen therapy with new diffuse bone metastasis diagnosed in November who presents with worsening left hip pain and nausea and vomiting.    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 She went from using a cane to a walker and now mostly wheelchair bound. She has been on PRN 10mg  of oxycodone IR q4hr and has really been taking it scheduled with minimal relief. She was sent from palliative radiation today due to severe left hip pain. She is half way with her radiation and has one week remaining. She was just prescribed ER oxycodone by palliative care but just picked up today and has not started.    She also had nausea with dry heaves and had no solid food for the past week and a half. Denies shortness of breath or chest pain. No abdominal pain. No dysuria but has just noted dark urine. Noted bilateral LE edema around the same time but seems to be improving.    On arrival to ED, she was afebrile, HR high 90s, normotensive on room air.    CBC without leukocytosis or anemia but had thrombocytopenia of 110.   BMP notable for hyponatremia of 133, AKI with creatinine of 1.77, hypercalcemia of 11.7.   Pelvic x-ray demonstrated heavy burden of lytic osseous metastatic disease in the bony pelvis and right femur.  Pathological pelvic fracture difficult to exclude given heavy burden of osseous metastatic disease.  Stable sclerotic lesion in the left greater trochanter likely a treated metastatic lesion.  No fracture of the left proximal femur.   She was administered fluid, IV 0.5mg  Dilaudid in ED.    Assessment & Plan:   Principal Problem:   Hip pain Active Problems:   Cancer of overlapping sites of right female breast (HCC)   Metastasis to bone (HCC)   Hyponatremia   Hypercalcemia   AKI (acute kidney injury) (HCC)   Nausea & vomiting   Intractable pain   Acute hypoxic respiratory failure (HCC)   Bilateral leg edema   HTN (hypertension)  Left hip pain/heavy metastatic burden in the right hip: -Secondary to significant diffuse and extensive pelvic bone and hip metastasis. S/p left iliac bone biopsy 12/24 and awaiting results.  Presented with pain mostly to the left hip for which she is currently receiving palliative radiation since last week. -Has been taking PRN oxycodone IR q4hr which we'll continue, has been started on oxycodone ER q12hr upon admission and IV morphine for breakthrough pain -scheduled bowel regimen -Judicious use of opioids as patient had acute hypoxia following IV Dilaudid administration.  She also is hesitant in taking high-dose opioids especially fentanyl because her son passed away from drug overdose 2 years ago. -pelvic X-ray could not exclude pathologic fracture of the right hip due to diffuse metastasis.  This was also noted on recent CT pelvis earlier this month.  She reports discussion of prophylactic fixation of the right hip after radiation with oncology. Given that patient has no worsening pain on the right side will hold off on any further imaging.  Patient primary oncologist Dr. Mosetta Putt is off.  I discussed with Dr.  Moody and he does not think that Ortho consult is needed urgently.  He will continue radiations.  PT OT to see patient.  Plan will be to discharge home once medically more stable.   Cancer of overlapping sites of right female breast [facility]) -breast cancer s/p left lumpectomy, right mastectomy, chemotherapy, radiation on antiestrogen therapy -Continue tamoxifen   HTN (hypertension) -Blood pressure controlled.  Continue amlodipine   Bilateral  leg edema/failure to thrive/anasarca -symptoms started the same timeframe as her nausea, dry heaves in the setting of new palliative radiation -no other symptoms to suggest cardiac etiology. Lower likelihood of bilateral DVT.  -will arise elevation of LE. Unfortunately not able to dose Lasix with AKI and dehydration   Acute hypoxic respiratory failure (HCC) -secondary to administrating of IV opioid for pain.  She has been weaned to room air now. Judicious use of opioids for pain with close monitoring.   Nausea & vomiting -possibly anorexia from radiation therapy. LFTs and lipase are within normal limits. No fever or leukocytosis to suggest infection.  -PRN antiemetic.    AKI (acute kidney injury) (HCC) Creatinine elevated to 1.77. Pre-renal due to decrease oral intake, slightly improved.  Continue IV fluids through this afternoon and I encouraged her to drink plenty of fluids.  Repeat labs in the morning.   Hypercalcemia -corrected Ca of 11.8.  -Will hydrate overnight and follow trend in the morning. If it remains elevated, will need calcitonin and bisphosphonate.   Hyponatremia - Secondary to decreased oral intake, improved with IV fluids.  DVT prophylaxis: enoxaparin (LOVENOX) injection 30 mg Start: 06/05/23 2200   Code Status: Full Code  Family Communication:  None present at bedside.  Plan of care discussed with patient in length and he/she verbalized understanding and agreed with it.  Status is: Observation The patient will require care spanning > 2 midnights and should be moved to inpatient because: To be seen by PT OT.   Estimated body mass index is 23.27 kg/m as calculated from the following:   Height as of this encounter: 5\' 4"  (1.626 m).   Weight as of this encounter: 61.5 kg.    Nutritional Assessment: Body mass index is 23.27 kg/m.[name] Seen by dietician.  I agree with the assessment and plan as outlined below: Nutrition Status:        . Skin Assessment: I have  examined the patient's skin and I agree with the wound assessment as performed by the wound care RN as outlined below:    Consultants:  Radiation oncology  Procedures:  None  Antimicrobials:  Anti-infectives (From admission, onward)    None         Subjective: Patient seen and examined.  She complains of pain in the left hip, slightly better than yesterday.  No complaints of any pain in the right hip.  No other complaint.  Objective: Vitals:   06/05/23 2227 06/06/23 0205 06/06/23 0240 06/06/23 0602  BP: (!) 162/83  (!) 145/82 (!) 152/83  Pulse: (!) 108  96 98  Resp: 16  16 18   Temp: 98.5 F (36.9 C)  98 F (36.7 C) 98.3 F (36.8 C)  TempSrc: Oral  Oral Oral  SpO2: 94%  98% 100%  Weight:  61.5 kg    Height:  5\' 4"  (1.626 m)      Intake/Output Summary (Last 24 hours) at 06/06/2023 0804 Last data filed at 06/06/2023 0600 Gross per 24 hour  Intake 1485.63 ml  Output 450 ml  Net 1035.63 ml  Filed Weights   06/05/23 1644 06/06/23 0205  Weight: 66.7 kg 61.5 kg    Examination:  General exam: Appears calm and comfortable  Respiratory system: Clear to auscultation. Respiratory effort normal. Cardiovascular system: S1 & S2 heard, RRR. No JVD, murmurs, rubs, gallops or clicks. No pedal edema. Gastrointestinal system: Abdomen is nondistended, soft and nontender. No organomegaly or masses felt. Normal bowel sounds heard. Central nervous system: Alert and oriented. No focal neurological deficits. Extremities: Limited range of motion-4/5 power in bilateral lower extremities. Skin: No rashes, lesions or ulcers.  Psychiatry: Judgement and insight appear normal. Mood & affect appropriate.   Data Reviewed: I have personally reviewed following labs and imaging studies  CBC: Recent Labs  Lab 06/03/23 0928 06/05/23 1753 06/06/23 0522  WBC 5.7 4.9 2.7*  NEUTROABS  --  4.2  --   HGB 12.5 12.8 12.5  HCT 37.1 37.8 37.3  MCV 91.6 92.2 93.7  PLT 144* 110* 93*   Basic  Metabolic Panel: Recent Labs  Lab 06/05/23 1753 06/06/23 0522  NA 133* 136  K 4.1 3.7  CL 98 101  CO2 25 25  GLUCOSE 129* 93  BUN 38* 35*  CREATININE 1.77* 1.38*  CALCIUM 11.7* 11.4*  MG 2.3  --    GFR: Estimated Creatinine Clearance: 33.2 mL/min (A) (by C-G formula based on SCr of 1.38 mg/dL (H)). Liver Function Tests: Recent Labs  Lab 06/05/23 1753  AST 28  ALT 23  ALKPHOS 69  BILITOT 0.6  PROT 6.3*  ALBUMIN 3.4*   Recent Labs  Lab 06/05/23 1753  LIPASE 25   No results for input(s): "AMMONIA" in the last 168 hours. Coagulation Profile: Recent Labs  Lab 06/03/23 0928  INR 1.1   Cardiac Enzymes: No results for input(s): "CKTOTAL", "CKMB", "CKMBINDEX", "TROPONINI" in the last 168 hours. BNP (last 3 results) No results for input(s): "PROBNP" in the last 8760 hours. HbA1C: No results for input(s): "HGBA1C" in the last 72 hours. CBG: Recent Labs  Lab 06/05/23 1815  GLUCAP 114*   Lipid Profile: No results for input(s): "CHOL", "HDL", "LDLCALC", "TRIG", "CHOLHDL", "LDLDIRECT" in the last 72 hours. Thyroid Function Tests: No results for input(s): "TSH", "T4TOTAL", "FREET4", "T3FREE", "THYROIDAB" in the last 72 hours. Anemia Panel: No results for input(s): "VITAMINB12", "FOLATE", "FERRITIN", "TIBC", "IRON", "RETICCTPCT" in the last 72 hours. Sepsis Labs: No results for input(s): "PROCALCITON", "LATICACIDVEN" in the last 168 hours.  No results found for this or any previous visit (from the past 240 hours).   Radiology Studies: DG HIP UNILAT WITH PELVIS 2-3 VIEWS LEFT Result Date: 06/05/2023 CLINICAL DATA:  Left hip pain, metastatic breast cancer with skeletal involvement EXAM: DG HIP (WITH OR WITHOUT PELVIS) 2-3V LEFT COMPARISON:  CT pelvis 05/16/2023 FINDINGS: Stable sclerotic lesion in the left greater trochanter. This likely represents a treated metastatic lesion. No fracture the left proximal femur is identified. However, there is ill-defined abnormal  lucency scattered throughout the bony pelvis most confluent in the left iliac bone and left sacrum compatible with lytic metastatic lesions and better shown on the CT from 05/16/2023. Pathologic pelvic fracture difficult to exclude given the heavy burden of osseous metastatic disease. There is lytic involvement in the right (contralateral) femoral head, femoral neck, and proximal metaphysis. IMPRESSION: 1. Heavy burden of lytic osseous metastatic disease in the bony pelvis and right (contralateral) femur. Pathologic pelvic fracture difficult to exclude given the heavy burden of osseous metastatic disease. 2. Stable sclerotic lesion in the left greater trochanter, likely a  treated metastatic lesion. 3. No fracture of the left proximal femur is identified. Electronically Signed   By: Gaylyn Rong M.D.   On: 06/05/2023 18:11    Scheduled Meds:  amLODipine  5 mg Oral Daily   enoxaparin (LOVENOX) injection  30 mg Subcutaneous Q24H   feeding supplement  237 mL Oral BID BM   oxyCODONE  10 mg Oral Q12H   senna-docusate  1 tablet Oral BID   tamoxifen  20 mg Oral Daily   Continuous Infusions:  lactated ringers 100 mL/hr at 06/06/23 0550     LOS: 0 days   Hughie Closs, MD [facility]  06/06/2023, 8:04 AM   *Please note that this is a verbal dictation therefore any spelling or grammatical errors are due to the "Dragon Medical One" system interpretation.  Please page via Amion and do not message via secure chat for urgent patient care matters. Secure chat can be used for non urgent patient care matters.  How to contact the [facility] Attending or Consulting provider 7A - 7P or covering provider during after hours 7P -7A, for this patient?  Check the care team in [facility] and look for a) attending/consulting [facility] provider listed and b) the [facility] team listed. Page or secure chat 7A-7P. Log into [email or web address] and use [facility] universal password to access. If you do not have the password, please  contact the hospital operator. Locate the [facility] provider you are looking for under [facility] and page to a number that you can be directly reached. If you still have difficulty reaching the provider, please page the [facility] (Director on Call) for the Hospitalists listed on amion for assistance.

## 2023-06-06 NOTE — Evaluation (Signed)
Physical Therapy Evaluation Patient Details Name: Darlene Beasley MRN: [id number] DOB: Feb 11, 1956 Today's Date: 06/06/2023  History of Present Illness  67 y.o. female who presents with worsening left hip pain and nausea and vomiting and admitted 06/05/23 for Left hip pain/heavy metastatic burden in the right hip:  -Secondary to significant diffuse and extensive pelvic bone and hip metastasis. S/p left iliac bone biopsy 12/24 and awaiting results.  Presented with pain mostly to the left hip for which she is currently receiving palliative radiation since last week.   Past medical history significant of breast cancer s/p left lumpectomy, right mastectomy, chemotherapy, radiation on antiestrogen therapy with new diffuse bone metastasis diagnosed in November .  Clinical Impression  Patient evaluated by Physical Therapy with no further acute PT needs identified. All education has been completed and the patient has no further questions. Pt reports she has been dealing with decreased mobility and mostly now w/c bound due to pain.  Pt's spouse assists with mobilizing and w/c propulsion and pt reports using "pot" at home for toileting needs.  Pt declined participating or need to work on transfers in hospital as she hopes to d/c this weekend.  Pt reports pain is significantly limiting her mobility at this time.  Pt very agreeable to HHPT and would benefit from home safety evaluation to make further suggestions for safe mobility, DME, and decreasing burden of care with return home.  If spouse does not feel able to assist upon d/c, pt may need SNF however pt anticipates return home at this time.  Pt reports spouse with difficulty sometimes bumping up w/c at home so provided w/c handouts for both "curb/one step" and multiple steps technique to assist with w/c. See below for any follow-up Physical Therapy or equipment needs. PT is signing off. Thank you for this referral.         If plan is discharge home, recommend the  following: A lot of help with walking and/or transfers;A lot of help with bathing/dressing/bathroom;Assistance with cooking/housework;Assist for transportation;Help with stairs or ramp for entrance   Can travel by private vehicle        Equipment Recommendations None recommended by PT  Recommendations for Other Services       Functional Status Assessment Patient has had a recent decline in their functional status and demonstrates the ability to make significant improvements in function in a reasonable and predictable amount of time.     Precautions / Restrictions Precautions Precautions: Fall Precaution Comments: pelvic X-ray could not exclude pathologic fracture of the right hip due to diffuse metastasis      Mobility  Bed Mobility Overal bed mobility: Needs Assistance Bed Mobility: Rolling Rolling: Min assist         General bed mobility comments: pt assisting with upper body using rails, assist for completing rolling and maintaining at lower body/hips - only performed for pericare and bed pan/bed pad change    Transfers                        Ambulation/Gait                  Stairs            Wheelchair Mobility     Tilt Bed    Modified Rankin (Stroke Patients Only)       Balance  Pertinent Vitals/Pain Pain Assessment Pain Assessment: Faces Faces Pain Scale: Hurts worst Pain Location: hips and LEs with bed mobility Pain Descriptors / Indicators: Guarding, Grimacing Pain Intervention(s): Repositioned, Monitored during session (reported she has been receiving pain meds)    Home Living Family/patient expects to be discharged to:: Private residence Living Arrangements: Spouse/significant other   Type of Home: House Home Access: Stairs to enter   Secretary/administrator of Steps: 1   Home Layout: Able to live on main level with bedroom/bathroom Home Equipment:  Agricultural consultant (2 wheels);Cane - single point;Wheelchair - manual Additional Comments: living room requires 2 steps    Prior Function Prior Level of Function : Needs assist             Mobility Comments: only transfers to w/c at this time due to pain, assist from spouse for transfers and for steps with w/c ADLs Comments: has been using a "pot" like bed pan type situation for toileting     Extremity/Trunk Assessment        Lower Extremity Assessment Lower Extremity Assessment: Generalized weakness (not further assessed due to diffuse bony mets)       Communication   Communication Communication: No apparent difficulties  Cognition Arousal: Alert Behavior During Therapy: WFL for tasks assessed/performed, Flat affect Overall Cognitive Status: Within Functional Limits for tasks assessed                                          General Comments      Exercises     Assessment/Plan    PT Assessment All further PT needs can be met in the next venue of care  PT Problem List Decreased mobility;Decreased activity tolerance;Decreased strength;Pain;Decreased knowledge of use of DME       PT Treatment Interventions      PT Goals (Current goals can be found in the Care Plan section)  Acute Rehab PT Goals PT Goal Formulation: All assessment and education complete, DC therapy    Frequency       Co-evaluation               AM-PAC PT "6 Clicks" Mobility  Outcome Measure Help needed turning from your back to your side while in a flat bed without using bedrails?: A Lot Help needed moving from lying on your back to sitting on the side of a flat bed without using bedrails?: A Lot Help needed moving to and from a bed to a chair (including a wheelchair)?: Total Help needed standing up from a chair using your arms (e.g., wheelchair or bedside chair)?: Total Help needed to walk in hospital room?: Total Help needed climbing 3-5 steps with a railing? :  Total 6 Click Score: 8    End of Session   Activity Tolerance: Patient limited by pain Patient left: in bed;with call bell/phone within reach;with nursing/sitter in room Nurse Communication: Mobility status PT Visit Diagnosis: Other abnormalities of gait and mobility (R26.89);Pain    Time: 6578-4696 PT Time Calculation (min) (ACUTE ONLY): 20 min   Charges:   PT Evaluation $PT Eval Low Complexity: 1 Low   PT General Charges $$ ACUTE PT VISIT: 1 Visit        Thomasene Mohair PT, DPT Physical Therapist Acute Rehabilitation Services Office: [phone]   Darlene Beasley 06/06/2023, 2:14 PM

## 2023-06-06 NOTE — Progress Notes (Signed)
   06/06/23 2329  TOC Brief Assessment  Insurance and Status Reviewed  Patient has primary care physician Yes  Home environment has been reviewed yes (From home with spouse)  Prior level of function: From home with spouse  Prior/Current Home Services No current home services  Social Drivers of Health Review SDOH reviewed interventions complete  Readmission risk has been reviewed Yes  Transition of care needs no transition of care needs at this time (TOC will follow due to SDOH screen)   Currently no TOC need. Resources added to AVS.

## 2023-06-06 NOTE — Plan of Care (Signed)

## 2023-06-06 NOTE — Plan of Care (Signed)
  Problem: Education: Goal: Knowledge of General Education information will improve Description Including pain rating scale, medication(s)/side effects and non-pharmacologic comfort measures Outcome: Progressing   Problem: Health Behavior/Discharge Planning: Goal: Ability to manage health-related needs will improve Outcome: Progressing   

## 2023-06-06 NOTE — Telephone Encounter (Signed)
Opened in error

## 2023-06-07 DIAGNOSIS — C50811 Malignant neoplasm of overlapping sites of right female breast: Secondary | ICD-10-CM | POA: Diagnosis not present

## 2023-06-07 DIAGNOSIS — M25552 Pain in left hip: Secondary | ICD-10-CM | POA: Diagnosis not present

## 2023-06-07 DIAGNOSIS — Z170 Estrogen receptor positive status [ER+]: Secondary | ICD-10-CM | POA: Diagnosis not present

## 2023-06-07 LAB — RENAL FUNCTION PANEL
Albumin: 2.9 g/dL — ABNORMAL LOW (ref 3.5–5.0)
Anion gap: 8 (ref 5–15)
BUN: 26 mg/dL — ABNORMAL HIGH (ref 8–23)
CO2: 28 mmol/L (ref 22–32)
Calcium: 11.7 mg/dL — ABNORMAL HIGH (ref 8.9–10.3)
Chloride: 104 mmol/L (ref 98–111)
Creatinine, Ser: 1.11 mg/dL — ABNORMAL HIGH (ref 0.44–1.00)
GFR, Estimated: 54 mL/min — ABNORMAL LOW (ref >60)
Glucose, Bld: 117 mg/dL — ABNORMAL HIGH (ref 70–99)
Phosphorus: 2.8 mg/dL (ref 2.5–4.6)
Potassium: 4.2 mmol/L (ref 3.5–5.1)
Sodium: 140 mmol/L (ref 135–145)

## 2023-06-07 NOTE — Progress Notes (Signed)
Subjective: The patient, a 67 year old individual with a history of bilateral breast cancer, presented with worsening left leg pain. She was initially diagnosed in 2019 and underwent a right mastectomy and left lumpectomy, after neoadjuvant chemotherapy, followed by radiation, and hormonal therapy. The patient was initially treated with an aromatase inhibitor, which was later switched to tamoxifen, which she has been taking for approximately a year.  Recently, the patient was found to have bone metastases, initially on the left side, and underwent radiation treatment. The patient reported that the pain initially started on the right side and then moved to the left. Despite taking Tylenol and oxycodone, the pain was not adequately controlled, leading to her current hospital admission.  The patient also reported feeling nauseous but denied any other symptoms such as constipation, diarrhea, chest pain, or breathing issues. She was not sure if she had started a new drug in addition to her hormonal therapies.  Objective: Vital signs in last 24 hours: Temp:  [97.3 F (36.3 C)-98.7 F (37.1 C)] 98.7 F (37.1 C) (12/28 0611) Pulse Rate:  [96-108] 96 (12/28 0611) Resp:  [18] 18 (12/28 0611) BP: (129-171)/(77-87) 129/86 (12/28 0611) SpO2:  [98 %-100 %] 98 % (12/28 0611)  Intake/Output from previous day: 12/27 0701 - 12/28 0700 In: 480 [P.O.:480] Out: -  Intake/Output this shift: No intake/output data recorded.  General appearance: alert, cooperative, fatigued, and no distress Resp: clear to auscultation bilaterally Cardio: regular rate and rhythm, S1, S2 normal, no murmur, click, rub or gallop GI: soft, non-tender; bowel sounds normal; no masses,  no organomegaly Extremities: extremities normal, atraumatic, no cyanosis or edema  Lab Results:  Recent Labs    06/05/23 1753 06/06/23 0522  WBC 4.9 2.7*  HGB 12.8 12.5  HCT 37.8 37.3  PLT 110* 93*   BMET Recent Labs    06/06/23 0522  06/07/23 0922  NA 136 140  K 3.7 4.2  CL 101 104  CO2 25 28  GLUCOSE 93 117*  BUN 35* 26*  CREATININE 1.38* 1.11*  CALCIUM 11.4* 11.7*    Studies/Results: DG HIP UNILAT WITH PELVIS 2-3 VIEWS LEFT Result Date: 06/05/2023 CLINICAL DATA:  Left hip pain, metastatic breast cancer with skeletal involvement EXAM: DG HIP (WITH OR WITHOUT PELVIS) 2-3V LEFT COMPARISON:  CT pelvis 05/16/2023 FINDINGS: Stable sclerotic lesion in the left greater trochanter. This likely represents a treated metastatic lesion. No fracture the left proximal femur is identified. However, there is ill-defined abnormal lucency scattered throughout the bony pelvis most confluent in the left iliac bone and left sacrum compatible with lytic metastatic lesions and better shown on the CT from 05/16/2023. Pathologic pelvic fracture difficult to exclude given the heavy burden of osseous metastatic disease. There is lytic involvement in the right (contralateral) femoral head, femoral neck, and proximal metaphysis. IMPRESSION: 1. Heavy burden of lytic osseous metastatic disease in the bony pelvis and right (contralateral) femur. Pathologic pelvic fracture difficult to exclude given the heavy burden of osseous metastatic disease. 2. Stable sclerotic lesion in the left greater trochanter, likely a treated metastatic lesion. 3. No fracture of the left proximal femur is identified. Electronically Signed   By: Gaylyn Rong M.D.   On: 06/05/2023 18:11    Medications: I have reviewed the patient's current medications.   Assessment/Plan: Breast Cancer with Bone Metastasis Diagnosed with right breast cancer in 2019, treated with mastectomy on the right, lumpectomy on the left, neoadjuvant chemotherapy, radiation, and hormonal therapy. Currently on Letrozole and Verzenio. Recent scans show bone  metastasis with significant destruction on the right hip, close to a pathological fracture. Pain in the left leg, not adequately managed with  oxycodone. Discussed need for orthopedic consultation and pain management adjustment. - Consult orthopedic surgeon for evaluation and potential intervention for right hip - Adjust pain medication with palliative care team - Provide antiemetics for nausea management  Follow-up - Coordinate with primary oncologist, Dr. Mosetta Putt, for ongoing cancer treatment and management - Ensure follow-up with orthopedic surgeon and palliative care team.    LOS: 1 day    Lajuana Matte 06/07/2023

## 2023-06-07 NOTE — Progress Notes (Signed)
PROGRESS NOTE    [name]  UEA:[id number] DOB: 03/11/1956 DOA: 06/05/2023 PCP: Estevan Oaks, NP   Brief Narrative:    HPI: Darlene Beasley is a 67 y.o. female with medical history significant of breast cancer s/p left lumpectomy, right mastectomy, chemotherapy, radiation on antiestrogen therapy with new diffuse bone metastasis diagnosed in November who presents with worsening left hip pain, nausea and vomiting.  Patient went from using a cane to a walker and now mostly wheelchair bound. She was on PRN 10mg  of oxycodone IR q4hr and has really been taking it scheduled with minimal relief. She was sent from palliative radiation today due to severe left hip pain. She is half way with her radiation and has one week remaining. She was just prescribed ER oxycodone by palliative care but just picked up today and has not started.  Associated poor appetite, with minimal intake in the last 1 to 2 weeks.  No abdominal pain. No dysuria but has just noted dark urine. Noted bilateral LE edema around the same time but seems to be improving.  On arrival to ED,  thrombocytopenia of 110, sodium of 133, serum creatinine of 1.77 and calcium of 11.7 were noted.  Pelvic x-ray demonstrated heavy burden of lytic osseous metastatic disease in the bony pelvis and right femur.  Pathological pelvic fracture difficult to exclude given heavy burden of osseous metastatic disease.  Stable sclerotic lesion in the left greater trochanter, likely treated metastatic lesion.  No fracture of the left proximal femur.  Patient underwent successful CT-guided LEFT iliac bone lesion biopsy on 06/03/2023.    06/07/2023: Patient seen.  Patient continues to report left hip/inguinal pain, worse with movement.  No other constitutional symptoms endorsed.  Acute kidney injury slowly improving.  Calcium is down to 11.4.  Assessment & Plan:   Principal Problem:   Hip pain Active Problems:   Cancer of overlapping sites of right female breast  (HCC)   Metastasis to bone (HCC)   Hyponatremia   Hypercalcemia   AKI (acute kidney injury) (HCC)   Nausea & vomiting   Intractable pain   Acute hypoxic respiratory failure (HCC)   Bilateral leg edema   HTN (hypertension)  Left hip pain/heavy metastatic burden in the right hip: -Secondary to significant diffuse and extensive pelvic bone and hip metastasis. S/p left iliac bone biopsy 12/24 and awaiting results.  Presented with pain mostly to the left hip for which she is currently receiving palliative radiation since last week. -Patient is currently on OxyContin 10 Mg twice daily and oxycodone immediate release 5-10 Mg p.o. every 4 hours as needed.   -Optimize pain control. -Patient is hesitant in taking high-dose opioids especially fentanyl because her son passed away from drug overdose 2 years ago. -pelvic X-ray could not exclude pathologic fracture of the right hip due to diffuse metastasis.  This was also noted on recent CT pelvis earlier this month.  She reports discussion of prophylactic fixation of the right hip after radiation with oncology.  -Patient's primary oncologist, Dr. Mosetta Putt, is off.   -Dr. Mitzi Hansen is assisting with patient's care (he does not think that Ortho consult is needed urgently).  He will continue radiations.   -PT/OT. -Likely discharge back home once medically stable.     Cancer of overlapping sites of right female breast [facility]) -breast cancer s/p left lumpectomy, right mastectomy, chemotherapy, radiation on antiestrogen therapy -Continue tamoxifen   HTN (hypertension) -Blood pressure controlled.  Continue amlodipine   Bilateral leg edema/failure  to thrive/anasarca   Acute hypoxic respiratory failure (HCC) -secondary to administrating of IV opioid for pain.  She has been weaned to room air now. Judicious use of opioids for pain with close monitoring.   Nausea & vomiting -possibly anorexia from radiation therapy. LFTs and lipase are within normal limits. No  fever or leukocytosis to suggest infection.  -PRN antiemetic.    AKI (acute kidney injury) (HCC) -Slowly improving. -Adequate hydration. -Avoid nephrotoxins. -Monitor renal function and electrolytes. -Keep MAP greater than 65 mmHg.     Hypercalcemia -Slowly improving. -Calcium level is also improving.   -Calcium is down to 11.4. -Continue hydration.   -Will repeat levels today.  Hyponatremia - Secondary to decreased oral intake, improved with IV fluids. 06/07/2023: Resolved.  DVT prophylaxis: enoxaparin (LOVENOX) injection 30 mg Start: 06/05/23 2200   Code Status: Full Code  Family Communication:  None present at bedside.     Status is: Inpatient The patient will require care spanning > 2 midnights and should be moved to inpatient because: To be seen by PT OT.   Estimated body mass index is 23.27 kg/m as calculated from the following:   Height as of this encounter: 5\' 4"  (1.626 m).   Weight as of this encounter: 61.5 kg.    Nutritional Assessment: Body mass index is 23.27 kg/m.[name] Seen by dietician.  I agree with the assessment and plan as outlined below: Nutrition Status:        . Skin Assessment: I have examined the patient's skin and I agree with the wound assessment as performed by the wound care RN as outlined below:    Consultants:  Radiation oncology  Procedures:  None  Antimicrobials:  Anti-infectives (From admission, onward)    None         Subjective: Patient seen and examined.   Patient continues to report left hip/inguinal pain.  Objective: Vitals:   06/06/23 1315 06/06/23 2141 06/06/23 2200 06/07/23 0611  BP: 134/83 (!) 171/87 139/77 129/86  Pulse: 96 (!) 108 97 96  Resp: 18 18  18   Temp: (!) 97.3 F (36.3 C) 98.6 F (37 C)  98.7 F (37.1 C)  TempSrc: Oral Oral  Oral  SpO2: 99% 100%  98%  Weight:      Height:        Intake/Output Summary (Last 24 hours) at 06/07/2023 0845 Last data filed at 06/07/2023 0500 Gross per 24  hour  Intake 480 ml  Output --  Net 480 ml   Filed Weights   06/05/23 1644 06/06/23 0205  Weight: 66.7 kg 61.5 kg    Examination:  General exam: Not in any distress.  Awake and alert.   Respiratory system: Clear to auscultation.  Cardiovascular system: S1 & S2  Gastrointestinal system: Abdomen is soft and nontender.   Central nervous system: Awake and alert.  Patient moves all extremities.      Data Reviewed: I have personally reviewed following labs and imaging studies  CBC: Recent Labs  Lab 06/03/23 0928 06/05/23 1753 06/06/23 0522  WBC 5.7 4.9 2.7*  NEUTROABS  --  4.2  --   HGB 12.5 12.8 12.5  HCT 37.1 37.8 37.3  MCV 91.6 92.2 93.7  PLT 144* 110* 93*   Basic Metabolic Panel: Recent Labs  Lab 06/05/23 1753 06/06/23 0522  NA 133* 136  K 4.1 3.7  CL 98 101  CO2 25 25  GLUCOSE 129* 93  BUN 38* 35*  CREATININE 1.77* 1.38*  CALCIUM 11.7* 11.4*  MG 2.3  --    GFR: Estimated Creatinine Clearance: 33.2 mL/min (A) (by C-G formula based on SCr of 1.38 mg/dL (H)). Liver Function Tests: Recent Labs  Lab 06/05/23 1753  AST 28  ALT 23  ALKPHOS 69  BILITOT 0.6  PROT 6.3*  ALBUMIN 3.4*   Recent Labs  Lab 06/05/23 1753  LIPASE 25   No results for input(s): "AMMONIA" in the last 168 hours. Coagulation Profile: Recent Labs  Lab 06/03/23 0928  INR 1.1   Cardiac Enzymes: No results for input(s): "CKTOTAL", "CKMB", "CKMBINDEX", "TROPONINI" in the last 168 hours. BNP (last 3 results) No results for input(s): "PROBNP" in the last 8760 hours. HbA1C: No results for input(s): "HGBA1C" in the last 72 hours. CBG: Recent Labs  Lab 06/05/23 1815  GLUCAP 114*   Lipid Profile: No results for input(s): "CHOL", "HDL", "LDLCALC", "TRIG", "CHOLHDL", "LDLDIRECT" in the last 72 hours. Thyroid Function Tests: No results for input(s): "TSH", "T4TOTAL", "FREET4", "T3FREE", "THYROIDAB" in the last 72 hours. Anemia Panel: No results for input(s): "VITAMINB12",  "FOLATE", "FERRITIN", "TIBC", "IRON", "RETICCTPCT" in the last 72 hours. Sepsis Labs: No results for input(s): "PROCALCITON", "LATICACIDVEN" in the last 168 hours.  No results found for this or any previous visit (from the past 240 hours).   Radiology Studies: DG HIP UNILAT WITH PELVIS 2-3 VIEWS LEFT Result Date: 06/05/2023 CLINICAL DATA:  Left hip pain, metastatic breast cancer with skeletal involvement EXAM: DG HIP (WITH OR WITHOUT PELVIS) 2-3V LEFT COMPARISON:  CT pelvis 05/16/2023 FINDINGS: Stable sclerotic lesion in the left greater trochanter. This likely represents a treated metastatic lesion. No fracture the left proximal femur is identified. However, there is ill-defined abnormal lucency scattered throughout the bony pelvis most confluent in the left iliac bone and left sacrum compatible with lytic metastatic lesions and better shown on the CT from 05/16/2023. Pathologic pelvic fracture difficult to exclude given the heavy burden of osseous metastatic disease. There is lytic involvement in the right (contralateral) femoral head, femoral neck, and proximal metaphysis. IMPRESSION: 1. Heavy burden of lytic osseous metastatic disease in the bony pelvis and right (contralateral) femur. Pathologic pelvic fracture difficult to exclude given the heavy burden of osseous metastatic disease. 2. Stable sclerotic lesion in the left greater trochanter, likely a treated metastatic lesion. 3. No fracture of the left proximal femur is identified. Electronically Signed   By: Gaylyn Rong M.D.   On: 06/05/2023 18:11    Scheduled Meds:  amLODipine  5 mg Oral Daily   enoxaparin (LOVENOX) injection  30 mg Subcutaneous Q24H   feeding supplement  237 mL Oral BID BM   oxyCODONE  10 mg Oral Q12H   senna-docusate  1 tablet Oral BID   tamoxifen  20 mg Oral Daily   Continuous Infusions:     LOS: 1 day   Barnetta Chapel, MD [facility]  06/07/2023, 8:45 AM   *Please note that this is a  verbal dictation therefore any spelling or grammatical errors are due to the "[employer] One" system interpretation.  Please page via Amion and do not message via secure chat for urgent patient care matters. Secure chat can be used for non urgent patient care matters.  How to contact the [facility] Attending or Consulting provider 7A - 7P or covering provider during after hours 7P -7A, for this patient?  Check the care team in [facility] and look for a) attending/consulting [facility] provider listed and b) the [facility] team listed. Page or secure chat 7A-7P. Log  into [email or web address] and use [facility]'s universal password to access. If you do not have the password, please contact the hospital operator. Locate the [facility] provider you are looking for under [facility] and page to a number that you can be directly reached. If you still have difficulty reaching the provider, please page the [facility] (Director on Call) for the Hospitalists listed on amion for assistance.

## 2023-06-07 NOTE — Progress Notes (Signed)
OT Cancellation Note  Patient Details Name: DONISHIA SHAMP MRN: [id number] DOB: [birth year]   Cancelled Treatment:    Reason Eval/Treat Not Completed: Other (comment) Arrived to pt's room at 2:45PM to complete OT evaluation. Pt on phone. OT waited for pt to end call. At 2:53PM, pt remained on phone call with no indication to end call to speak to OT. Will re-attempt OT eval tomorrow if able.   Limmie Patricia, OTR/L,CBIS  Supplemental OT - [facility] and [facility] Secure Chat Preferred   06/07/2023, 2:55 PM

## 2023-06-08 DIAGNOSIS — C7951 Secondary malignant neoplasm of bone: Secondary | ICD-10-CM | POA: Diagnosis not present

## 2023-06-08 DIAGNOSIS — R52 Pain, unspecified: Secondary | ICD-10-CM

## 2023-06-08 LAB — RENAL FUNCTION PANEL
Albumin: 2.8 g/dL — ABNORMAL LOW (ref 3.5–5.0)
Anion gap: 9 (ref 5–15)
BUN: 22 mg/dL (ref 8–23)
CO2: 26 mmol/L (ref 22–32)
Calcium: 11.9 mg/dL — ABNORMAL HIGH (ref 8.9–10.3)
Chloride: 103 mmol/L (ref 98–111)
Creatinine, Ser: 0.85 mg/dL (ref 0.44–1.00)
GFR, Estimated: >60 mL/min (ref >60)
Glucose, Bld: 101 mg/dL — ABNORMAL HIGH (ref 70–99)
Phosphorus: 2.8 mg/dL (ref 2.5–4.6)
Potassium: 3.6 mmol/L (ref 3.5–5.1)
Sodium: 138 mmol/L (ref 135–145)

## 2023-06-08 MED ORDER — OXYCODONE HCL 5 MG PO TABS
5.0000 mg | ORAL_TABLET | ORAL | Status: DC | PRN
  Administered 2023-06-10 – 2023-07-01 (×2): 5 mg via ORAL
  Filled 2023-06-08 (×2): qty 1

## 2023-06-08 MED ORDER — KCL IN DEXTROSE-NACL 20-5-0.45 MEQ/L-%-% IV SOLN
INTRAVENOUS | Status: AC
Start: 2023-06-08 — End: 2023-06-09
  Filled 2023-06-08 (×3): qty 1000

## 2023-06-08 MED ORDER — OXYCODONE HCL ER 15 MG PO T12A
15.0000 mg | EXTENDED_RELEASE_TABLET | Freq: Two times a day (BID) | ORAL | Status: DC
  Administered 2023-06-08 – 2023-07-03 (×51): 15 mg via ORAL
  Filled 2023-06-08 (×51): qty 1

## 2023-06-08 MED ORDER — ENOXAPARIN SODIUM 40 MG/0.4ML IJ SOSY
40.0000 mg | PREFILLED_SYRINGE | INTRAMUSCULAR | Status: DC
  Administered 2023-06-08 – 2023-06-12 (×5): 40 mg via SUBCUTANEOUS
  Filled 2023-06-08 (×5): qty 0.4

## 2023-06-08 MED ORDER — CYCLOBENZAPRINE HCL 5 MG PO TABS
5.0000 mg | ORAL_TABLET | Freq: Three times a day (TID) | ORAL | Status: DC
  Administered 2023-06-08 – 2023-07-03 (×77): 5 mg via ORAL
  Filled 2023-06-08 (×77): qty 1

## 2023-06-08 MED ORDER — ZOLEDRONIC ACID 4 MG/100ML IV SOLN
4.0000 mg | Freq: Once | INTRAVENOUS | Status: AC
  Administered 2023-06-08: 4 mg via INTRAVENOUS
  Filled 2023-06-08: qty 100

## 2023-06-08 NOTE — Plan of Care (Signed)
°  Problem: Clinical Measurements: Goal: Will remain free from infection Outcome: Progressing Goal: Diagnostic test results will improve Outcome: Progressing   Problem: Nutrition: Goal: Adequate nutrition will be maintained Outcome: Progressing   Problem: Pain Management: Goal: General experience of comfort will improve Outcome: Progressing

## 2023-06-08 NOTE — Progress Notes (Signed)
PROGRESS NOTE    Darlene Beasley  VZD:[id number] DOB: [birth year] DOA: 06/05/2023 PCP: Estevan Oaks, NP    Brief Narrative:  67 year old metastatic breast cancer, chemoradiation and currently with diffuse metastatic disease in the bones admitted with worsening left hip pain, nausea.  Currently getting radiation on the hip.  Emergency room hemodynamically stable.  Creatinine 1.77, calcium 11.7.  Pelvic x-ray with heavy metastatic burden on the right hip, sclerotic lesion on the left greater trochanter.  Subjective: Patient seen and examined.  For last 3 days, she is just laying in the bed as it hurts to move.  She has been using OxyContin 10 mg twice daily without any other additional medications that she was already using at home. Patient feels comfortable when laying still but it hurts to move. Pain is only on the left hip not on the right hip.  Scheduled for radiation treatment tomorrow again. Advised patient to use additional pain medications, added muscle relaxants and work with PT OT for discharge readiness.   Assessment & Plan:   Left hip pain/heavy metastatic burden in the right hip.  Metastatic pain and disability. Hypercalcemia of malignancy.  Pain is mostly on the left hip, she does have a sclerotic lesion. Multiple lytic lesions on the right hip, not painful.  Patient tells me she was told by radiation Dr. Claiborne Billings there is no need to have orthopedics to fix it before completing radiation. Calcium is 11.9.  Creatinine has normalized on maintenance fluid.  Plan: Currently on OxyContin 10 mg twice daily, increase to 15 mg twice daily. Encouraged patient to take breakthrough pain medications, especially before activities and mobility.  Adding oxycodone every 4 hours 5 mg, she is reluctant to take as needed medications, will add Flexeril 5 mg 3 times daily and gradually increase the dose. Continue IV fluids today, significantly dehydrated on presentation. Renal functions has  improved, will give 1 dose of Zometa today. Work with PT OT. Right proximal femur lesions are high risk of fracture, however she plans to complete radiation therapy first.  Weightbearing as tolerated.  Essential hypertension: Blood pressure stable on amlodipine.  Acute kidney injury: As above.  Continue maintenance fluid today.      DVT prophylaxis: enoxaparin (LOVENOX) injection 30 mg Start: 06/05/23 2200   Code Status: Full code Family Communication: None at the bedside Disposition Plan: Status is: Inpatient Remains inpatient appropriate because: IV fluids, hypercalcemia, pain management     Consultants:  Oncology  Procedures:  None  Antimicrobials:  None     Objective: Vitals:   06/07/23 1359 06/07/23 2022 06/08/23 0606 06/08/23 1248  BP: 113/79 (!) 154/87 (!) 142/76 128/82  Pulse: 94 94 93 93  Resp: 16 16 15 13   Temp: 98.6 F (37 C) 98.3 F (36.8 C) 98.7 F (37.1 C) 98.2 F (36.8 C)  TempSrc: Oral Oral Oral Axillary  SpO2: 98% 99% 98% 99%  Weight:      Height:       No intake or output data in the 24 hours ending 06/08/23 1420 Filed Weights   06/05/23 1644 06/06/23 0205  Weight: 66.7 kg 61.5 kg    Examination:  General exam: Appears calm and comfortable  Chronically sick looking.  Mildly anxious especially on examination of the left hip. Respiratory system: Clear to auscultation. Respiratory effort normal. Cardiovascular system: S1 & S2 heard, RRR.  Port-A-Cath left chest wall. Gastrointestinal system: Soft.  Nontender.  Bowel sound present. Central nervous system: Alert and oriented. No focal neurological  deficits. Extremities: Symmetric 5 x 5 power. Patient does not have any palpable deformity, however she is limiting her movement on her left hip due to pain.    Data Reviewed: I have personally reviewed following labs and imaging studies  CBC: Recent Labs  Lab 06/03/23 0928 06/05/23 1753 06/06/23 0522  WBC 5.7 4.9 2.7*  NEUTROABS  --   4.2  --   HGB 12.5 12.8 12.5  HCT 37.1 37.8 37.3  MCV 91.6 92.2 93.7  PLT 144* 110* 93*   Basic Metabolic Panel: Recent Labs  Lab 06/05/23 1753 06/06/23 0522 06/07/23 0922 06/08/23 0539  NA 133* 136 140 138  K 4.1 3.7 4.2 3.6  CL 98 101 104 103  CO2 25 25 28 26   GLUCOSE 129* 93 117* 101*  BUN 38* 35* 26* 22  CREATININE 1.77* 1.38* 1.11* 0.85  CALCIUM 11.7* 11.4* 11.7* 11.9*  MG 2.3  --   --   --   PHOS  --   --  2.8 2.8   GFR: Estimated Creatinine Clearance: 53.9 mL/min (by C-G formula based on SCr of 0.85 mg/dL). Liver Function Tests: Recent Labs  Lab 06/05/23 1753 06/07/23 0922 06/08/23 0539  AST 28  --   --   ALT 23  --   --   ALKPHOS 69  --   --   BILITOT 0.6  --   --   PROT 6.3*  --   --   ALBUMIN 3.4* 2.9* 2.8*   Recent Labs  Lab 06/05/23 1753  LIPASE 25   No results for input(s): "AMMONIA" in the last 168 hours. Coagulation Profile: Recent Labs  Lab 06/03/23 0928  INR 1.1   Cardiac Enzymes: No results for input(s): "CKTOTAL", "CKMB", "CKMBINDEX", "TROPONINI" in the last 168 hours. BNP (last 3 results) No results for input(s): "PROBNP" in the last 8760 hours. HbA1C: No results for input(s): "HGBA1C" in the last 72 hours. CBG: Recent Labs  Lab 06/05/23 1815  GLUCAP 114*   Lipid Profile: No results for input(s): "CHOL", "HDL", "LDLCALC", "TRIG", "CHOLHDL", "LDLDIRECT" in the last 72 hours. Thyroid Function Tests: No results for input(s): "TSH", "T4TOTAL", "FREET4", "T3FREE", "THYROIDAB" in the last 72 hours. Anemia Panel: No results for input(s): "VITAMINB12", "FOLATE", "FERRITIN", "TIBC", "IRON", "RETICCTPCT" in the last 72 hours. Sepsis Labs: No results for input(s): "PROCALCITON", "LATICACIDVEN" in the last 168 hours.  No results found for this or any previous visit (from the past 240 hours).       Radiology Studies: No results found.      Scheduled Meds:  amLODipine  5 mg Oral Daily   cyclobenzaprine  5 mg Oral TID    enoxaparin (LOVENOX) injection  30 mg Subcutaneous Q24H   feeding supplement  237 mL Oral BID BM   oxyCODONE  15 mg Oral Q12H   senna-docusate  1 tablet Oral BID   tamoxifen  20 mg Oral Daily   zoledronic acid (ZOMETA) IVPB  4 mg Intravenous Once   Continuous Infusions:  dextrose 5 % and 0.45 % NaCl with KCl 20 mEq/L 125 mL/hr at 06/08/23 1319     LOS: 2 days    Time spent: 35 minutes    Dorcas Carrow, MD [facility]

## 2023-06-08 NOTE — Plan of Care (Signed)
  Problem: Education: Goal: Knowledge of General Education information will improve Description Including pain rating scale, medication(s)/side effects and non-pharmacologic comfort measures Outcome: Progressing   Problem: Health Behavior/Discharge Planning: Goal: Ability to manage health-related needs will improve Outcome: Progressing   Problem: Clinical Measurements: Goal: Will remain free from infection Outcome: Progressing Goal: Diagnostic test results will improve Outcome: Progressing Goal: Respiratory complications will improve Outcome: Progressing Goal: Cardiovascular complication will be avoided Outcome: Progressing   Problem: Activity: Goal: Risk for activity intolerance will decrease Outcome: Progressing   Problem: Nutrition: Goal: Adequate nutrition will be maintained Outcome: Progressing   Problem: Coping: Goal: Level of anxiety will decrease Outcome: Progressing

## 2023-06-09 ENCOUNTER — Ambulatory Visit
Admission: RE | Admit: 2023-06-09 | Discharge: 2023-06-09 | Disposition: A | Discharge status: Home / Self Care | Payer: 59 | Source: Ambulatory Visit | Attending: Radiation Oncology | Admitting: Radiation Oncology

## 2023-06-09 ENCOUNTER — Other Ambulatory Visit: Payer: Self-pay

## 2023-06-09 DIAGNOSIS — C7951 Secondary malignant neoplasm of bone: Secondary | ICD-10-CM | POA: Diagnosis not present

## 2023-06-09 DIAGNOSIS — R52 Pain, unspecified: Secondary | ICD-10-CM | POA: Diagnosis not present

## 2023-06-09 LAB — BASIC METABOLIC PANEL
Anion gap: 5 (ref 5–15)
BUN: 15 mg/dL (ref 8–23)
CO2: 28 mmol/L (ref 22–32)
Calcium: 10.9 mg/dL — ABNORMAL HIGH (ref 8.9–10.3)
Chloride: 104 mmol/L (ref 98–111)
Creatinine, Ser: 0.9 mg/dL (ref 0.44–1.00)
GFR, Estimated: >60 mL/min (ref >60)
Glucose, Bld: 149 mg/dL — ABNORMAL HIGH (ref 70–99)
Potassium: 3.9 mmol/L (ref 3.5–5.1)
Sodium: 137 mmol/L (ref 135–145)

## 2023-06-09 LAB — RAD ONC ARIA SESSION SUMMARY
Course Elapsed Days: 11
Plan Fractions Treated to Date: 6
Plan Fractions Treated to Date: 6
Plan Fractions Treated to Date: 6
Plan Prescribed Dose Per Fraction: 3 Gy
Plan Prescribed Dose Per Fraction: 3 Gy
Plan Prescribed Dose Per Fraction: 3 Gy
Plan Total Fractions Prescribed: 10
Plan Total Fractions Prescribed: 10
Plan Total Fractions Prescribed: 10
Plan Total Prescribed Dose: 30 Gy
Plan Total Prescribed Dose: 30 Gy
Plan Total Prescribed Dose: 30 Gy
Reference Point Dosage Given to Date: 18 Gy
Reference Point Dosage Given to Date: 18 Gy
Reference Point Dosage Given to Date: 18 Gy
Reference Point Session Dosage Given: 3 Gy
Reference Point Session Dosage Given: 3 Gy
Reference Point Session Dosage Given: 3 Gy
Session Number: 6

## 2023-06-09 MED ORDER — KCL IN DEXTROSE-NACL 20-5-0.45 MEQ/L-%-% IV SOLN
INTRAVENOUS | Status: AC
  Filled 2023-06-09 (×3): qty 1000

## 2023-06-09 NOTE — Progress Notes (Signed)
PROGRESS NOTE    Darlene Beasley  UXL:[id number] DOB: 1955/08/27 DOA: 06/05/2023 PCP: Estevan Oaks, NP    Brief Narrative:  67 year old metastatic breast cancer, chemoradiation and currently with diffuse metastatic disease in the bones admitted with worsening left hip pain, nausea.  Currently getting radiation on the hip.  Emergency room hemodynamically stable.  Creatinine 1.77, calcium 11.7.  Pelvic x-ray with heavy metastatic burden on the right hip, sclerotic lesion on the left greater trochanter.  Subjective:  Patient was seen and examined.  Continues to complains of left hip pain despite increasing long-acting pain medication.  She does not ask for any pain medicine for breakthrough pain.  She did have intermittent nausea.  Does not feel well. Remains on IV fluids, calcium has improved.  Renal functions have improved. She could not tolerate out of bed mobility.    Assessment & Plan:   Left hip pain/heavy metastatic burden in the right hip, pelvis and femur.  Metastatic pain and disability. Hypercalcemia of malignancy.  Pain is mostly on the left hip and with mobility.  She has a heavy lytic lesions all over the pelvis, spine and femur. Multiple lytic lesions on the right hip, not painful.  Patient tells me she was told by radiation Dr. Claiborne Billings there is no need to have orthopedics to fix it before completing radiation. Calcium was 11.9 -10.9 today.  On maintenance fluid.    Plan: Currently on OxyContin 10 mg twice daily, increased to 15 mg twice daily. Encouraged patient to take breakthrough pain medications, especially before activities and mobility.  Adding oxycodone every 4 hours 5 mg, she is reluctant to take as needed medications, will add Flexeril 5 mg 3 times daily and gradually increase the dose. Continue IV fluids today, significantly dehydrated on presentation as well hypercalcemia. Renal functions has improved, given a dose of Zometa on 12/29. Work with PT OT as much as  possible. Right proximal femur lesions are high risk of fracture, however she plans to complete radiation therapy first.  Weightbearing as tolerated. Scheduled for radiation treatment today.  Essential hypertension: Blood pressure stable on amlodipine.  Acute kidney injury: Improved.  As above.  Continue maintenance fluid today.      DVT prophylaxis: enoxaparin (LOVENOX) injection 40 mg Start: 06/08/23 2200   Code Status: Full code Family Communication: None at the bedside Disposition Plan: Status is: Inpatient Remains inpatient appropriate because: IV fluids, hypercalcemia, pain management     Consultants:  Oncology  Procedures:  None  Antimicrobials:  None     Objective: Vitals:   06/08/23 0606 06/08/23 1248 06/08/23 2107 06/09/23 0540  BP: (!) 142/76 128/82 128/75 106/68  Pulse: 93 93 90 87  Resp: 15 13 18 18   Temp: 98.7 F (37.1 C) 98.2 F (36.8 C) 98.2 F (36.8 C) 98.1 F (36.7 C)  TempSrc: Oral Axillary Oral Oral  SpO2: 98% 99% 97% 97%  Weight:      Height:        Intake/Output Summary (Last 24 hours) at 06/09/2023 1136 Last data filed at 06/09/2023 0218 Gross per 24 hour  Intake 1661.69 ml  Output --  Net 1661.69 ml   Filed Weights   06/05/23 1644 06/06/23 0205  Weight: 66.7 kg 61.5 kg    Examination:  General exam: Frail and debilitated.  Chronically sick looking.  In moderate distress on exam.   Respiratory system: Clear to auscultation. Respiratory effort normal. Cardiovascular system: S1 & S2 heard, RRR.  Port-A-Cath left chest wall. Gastrointestinal  system: Soft.  Nontender.  Bowel sound present. Central nervous system: Alert and oriented. No focal neurological deficits.  Unable to move mostly the left hip. Extremities: Symmetric 5 x 5 power. Patient does not have any palpable deformity, however she is limiting her movement on her left hip due to pain.    Data Reviewed: I have personally reviewed following labs and imaging  studies  CBC: Recent Labs  Lab 06/03/23 0928 06/05/23 1753 06/06/23 0522  WBC 5.7 4.9 2.7*  NEUTROABS  --  4.2  --   HGB 12.5 12.8 12.5  HCT 37.1 37.8 37.3  MCV 91.6 92.2 93.7  PLT 144* 110* 93*   Basic Metabolic Panel: Recent Labs  Lab 06/05/23 1753 06/06/23 0522 06/07/23 0922 06/08/23 0539 06/09/23 0513  NA 133* 136 140 138 137  K 4.1 3.7 4.2 3.6 3.9  CL 98 101 104 103 104  CO2 25 25 28 26 28   GLUCOSE 129* 93 117* 101* 149*  BUN 38* 35* 26* 22 15  CREATININE 1.77* 1.38* 1.11* 0.85 0.90  CALCIUM 11.7* 11.4* 11.7* 11.9* 10.9*  MG 2.3  --   --   --   --   PHOS  --   --  2.8 2.8  --    GFR: Estimated Creatinine Clearance: 50.9 mL/min (by C-G formula based on SCr of 0.9 mg/dL). Liver Function Tests: Recent Labs  Lab 06/05/23 1753 06/07/23 0922 06/08/23 0539  AST 28  --   --   ALT 23  --   --   ALKPHOS 69  --   --   BILITOT 0.6  --   --   PROT 6.3*  --   --   ALBUMIN 3.4* 2.9* 2.8*   Recent Labs  Lab 06/05/23 1753  LIPASE 25   No results for input(s): "AMMONIA" in the last 168 hours. Coagulation Profile: Recent Labs  Lab 06/03/23 0928  INR 1.1   Cardiac Enzymes: No results for input(s): "CKTOTAL", "CKMB", "CKMBINDEX", "TROPONINI" in the last 168 hours. BNP (last 3 results) No results for input(s): "PROBNP" in the last 8760 hours. HbA1C: No results for input(s): "HGBA1C" in the last 72 hours. CBG: Recent Labs  Lab 06/05/23 1815  GLUCAP 114*   Lipid Profile: No results for input(s): "CHOL", "HDL", "LDLCALC", "TRIG", "CHOLHDL", "LDLDIRECT" in the last 72 hours. Thyroid Function Tests: No results for input(s): "TSH", "T4TOTAL", "FREET4", "T3FREE", "THYROIDAB" in the last 72 hours. Anemia Panel: No results for input(s): "VITAMINB12", "FOLATE", "FERRITIN", "TIBC", "IRON", "RETICCTPCT" in the last 72 hours. Sepsis Labs: No results for input(s): "PROCALCITON", "LATICACIDVEN" in the last 168 hours.  No results found for this or any previous visit  (from the past 240 hours).       Radiology Studies: No results found.      Scheduled Meds:  amLODipine  5 mg Oral Daily   cyclobenzaprine  5 mg Oral TID   enoxaparin (LOVENOX) injection  40 mg Subcutaneous Q24H   feeding supplement  237 mL Oral BID BM   oxyCODONE  15 mg Oral Q12H   senna-docusate  1 tablet Oral BID   tamoxifen  20 mg Oral Daily   Continuous Infusions:  dextrose 5 % and 0.45 % NaCl with KCl 20 mEq/L 125 mL/hr at 06/09/23 0602     LOS: 3 days    Time spent: 35 minutes    Dorcas Carrow, MD [facility]

## 2023-06-09 NOTE — Progress Notes (Signed)
OT Cancellation Note  Patient Details Name: Darlene Beasley MRN: [id number] DOB: [birth year]   Cancelled Treatment:    Reason Eval/Treat Not Completed: Patient declined, no reason specified Patient with increased pain and reports of nausea. Noted to have build up on tongue with nurse made aware.  Rosalio Loud, MS Acute Rehabilitation Department Office# [phone]  06/09/2023, 8:41 AM

## 2023-06-09 NOTE — Plan of Care (Signed)
Patient in 9/10 pain, unable to tolerate getting on the bedpan or moving extremities. MD Notified and gave ok for purewick.  Problem: Education: Goal: Knowledge of General Education information will improve Description: Including pain rating scale, medication(s)/side effects and non-pharmacologic comfort measures Outcome: Progressing   Problem: Health Behavior/Discharge Planning: Goal: Ability to manage health-related needs will improve Outcome: Progressing   Problem: Clinical Measurements: Goal: Ability to maintain clinical measurements within normal limits will improve Outcome: Progressing Goal: Will remain free from infection Outcome: Progressing Goal: Diagnostic test results will improve Outcome: Progressing Goal: Respiratory complications will improve Outcome: Progressing Goal: Cardiovascular complication will be avoided Outcome: Progressing   Problem: Activity: Goal: Risk for activity intolerance will decrease Outcome: Progressing   Problem: Nutrition: Goal: Adequate nutrition will be maintained Outcome: Progressing   Problem: Coping: Goal: Level of anxiety will decrease Outcome: Progressing   Problem: Elimination: Goal: Will not experience complications related to bowel motility Outcome: Progressing Goal: Will not experience complications related to urinary retention Outcome: Progressing   Problem: Pain Management: Goal: General experience of comfort will improve Outcome: Progressing   Problem: Safety: Goal: Ability to remain free from injury will improve Outcome: Progressing   Problem: Skin Integrity: Goal: Risk for impaired skin integrity will decrease Outcome: Progressing

## 2023-06-09 NOTE — Plan of Care (Signed)
  Problem: Education: Goal: Knowledge of General Education information will improve Description: Including pain rating scale, medication(s)/side effects and non-pharmacologic comfort measures Outcome: Progressing   Problem: Health Behavior/Discharge Planning: Goal: Ability to manage health-related needs will improve Outcome: Progressing   Problem: Clinical Measurements: Goal: Ability to maintain clinical measurements within normal limits will improve Outcome: Progressing Goal: Will remain free from infection Outcome: Progressing Goal: Diagnostic test results will improve Outcome: Progressing Goal: Respiratory complications will improve Outcome: Progressing Goal: Cardiovascular complication will be avoided Outcome: Progressing   Problem: Nutrition: Goal: Adequate nutrition will be maintained Outcome: Progressing   Problem: Pain Management: Goal: General experience of comfort will improve Outcome: Progressing   Problem: Safety: Goal: Ability to remain free from injury will improve Outcome: Progressing   Problem: Skin Integrity: Goal: Risk for impaired skin integrity will decrease Outcome: Progressing   Problem: Skin Integrity: Goal: Risk for impaired skin integrity will decrease Outcome: Progressing   Problem: Safety: Goal: Ability to remain free from injury will improve Outcome: Progressing

## 2023-06-10 ENCOUNTER — Ambulatory Visit
Admission: RE | Admit: 2023-06-10 | Discharge: 2023-06-10 | Disposition: A | Discharge status: Home / Self Care | Payer: 59 | Source: Ambulatory Visit | Attending: Radiation Oncology

## 2023-06-10 ENCOUNTER — Other Ambulatory Visit: Payer: Self-pay

## 2023-06-10 DIAGNOSIS — R52 Pain, unspecified: Secondary | ICD-10-CM | POA: Diagnosis not present

## 2023-06-10 DIAGNOSIS — C7951 Secondary malignant neoplasm of bone: Secondary | ICD-10-CM | POA: Diagnosis not present

## 2023-06-10 LAB — RAD ONC ARIA SESSION SUMMARY
Course Elapsed Days: 12
Plan Fractions Treated to Date: 7
Plan Fractions Treated to Date: 7
Plan Fractions Treated to Date: 7
Plan Prescribed Dose Per Fraction: 3 Gy
Plan Prescribed Dose Per Fraction: 3 Gy
Plan Prescribed Dose Per Fraction: 3 Gy
Plan Total Fractions Prescribed: 10
Plan Total Fractions Prescribed: 10
Plan Total Fractions Prescribed: 10
Plan Total Prescribed Dose: 30 Gy
Plan Total Prescribed Dose: 30 Gy
Plan Total Prescribed Dose: 30 Gy
Reference Point Dosage Given to Date: 21 Gy
Reference Point Dosage Given to Date: 21 Gy
Reference Point Dosage Given to Date: 21 Gy
Reference Point Session Dosage Given: 3 Gy
Reference Point Session Dosage Given: 3 Gy
Reference Point Session Dosage Given: 3 Gy
Session Number: 7

## 2023-06-10 NOTE — Progress Notes (Signed)
 PROGRESS NOTE    Darlene Beasley  FMW:[id number] DOB: 1956/06/04 DOA: 06/05/2023 PCP: Delores Rojelio Caldron, NP    Brief Narrative:  67 year old metastatic breast cancer, chemoradiation and currently with diffuse metastatic disease in the bones admitted with worsening left hip pain, nausea.  Currently getting radiation on the hip.  Emergency room hemodynamically stable.  Creatinine 1.77, calcium 11.7.  Pelvic x-ray with heavy metastatic burden on the right hip, sclerotic lesion on the left greater trochanter.  Subjective:  Patient seen and examined.  Her significant other was at the bedside.  Came back from radiation therapy.  Has occasional nausea and using nausea medications. Patient tells me that it hurts to even mobilize in the bed.  She feels like she does not have adequate support system at home.  Her significant other tells me that he is also suffering from medical issues.  Their bathroom cannot accommodate walker.  She has difficulty working with therapy while in the hospital mostly related to pain or procedure. Agreeable to talk to palliative care. Surprised to hear that she has many metastatic lesions, she thought she had not had one bone metastatic disease.  Assessment & Plan:   Left hip pain/heavy metastatic burden in the right hip, pelvis and femur.  Metastatic pain and disability. Hypercalcemia of malignancy.  Pain is mostly on the left hip and with mobility.  She has heavy lytic lesions all over the pelvis, spine and femur. Multiple lytic lesions on the right hip, not painful.  Patient tells me she was told by radiation Dr. Sharman there is no need to have orthopedics to fix it before completing radiation. Calcium was 11.9 -10.9.  Will recheck tomorrow.  On maintenance fluid.  Plan: Previously on OxyContin  10 mg twice daily, increased to 15 mg twice daily. Encouraged patient to take breakthrough pain medications, especially before activities and mobility.  Adding oxycodone  every 4  hours 5 mg, she is reluctant to take as needed medications, scheduled Flexeril .   Continue IV fluids today, significantly dehydrated on presentation as well hypercalcemia. Renal functions has improved, given a dose of Zometa  on 12/29. Work with PT OT as much as possible. Right proximal femur lesions are high risk of fracture, however she plans to complete radiation therapy first.  Weightbearing as tolerated. Scheduled for radiation treatment today. Poor prognosis.  Severely debilitating pain.  Palliative consulted. Hopefully she can discuss with her oncologist Dr. Lanny.  Essential hypertension: Blood pressure stable on amlodipine .  Acute kidney injury: Improved.  As above.  Continue maintenance fluid today.  DVT prophylaxis: enoxaparin  (LOVENOX ) injection 40 mg Start: 06/08/23 2200   Code Status: Full code Family Communication: Significant other at the bedside. Disposition Plan: Status is: Inpatient Remains inpatient appropriate because: IV fluids, hypercalcemia, pain management     Consultants:  Oncology Palliative care  Procedures:  None  Antimicrobials:  None     Objective: Vitals:   06/09/23 0540 06/09/23 1519 06/09/23 2113 06/10/23 0450  BP: 106/68 109/66 113/71 135/73  Pulse: 87 92 94 95  Resp: 18 16 16 16   Temp: 98.1 F (36.7 C) 98.6 F (37 C) 99.3 F (37.4 C) 97.9 F (36.6 C)  TempSrc: Oral Oral Oral Oral  SpO2: 97% 98% 91% 96%  Weight:      Height:        Intake/Output Summary (Last 24 hours) at 06/10/2023 1137 Last data filed at 06/10/2023 0500 Gross per 24 hour  Intake 117 ml  Output 775 ml  Net -658 ml  Filed Weights   06/05/23 1644 06/06/23 0205  Weight: 66.7 kg 61.5 kg    Examination:  General exam: Chronically sick looking.  Frail and debilitated. Respiratory system: Clear to auscultation. Respiratory effort normal. Cardiovascular system: S1 & S2 heard, RRR.  Port-A-Cath left chest wall. Gastrointestinal system: Soft.  Nontender.   Bowel sound present. Central nervous system: Alert and oriented. No focal neurological deficits.  Unable to move mostly the left hip. Extremities: Symmetric 5 x 5 power. Patient does not have any palpable deformity, however she is limiting her movement on her left hip due to pain.    Data Reviewed: I have personally reviewed following labs and imaging studies  CBC: Recent Labs  Lab 06/05/23 1753 06/06/23 0522  WBC 4.9 2.7*  NEUTROABS 4.2  --   HGB 12.8 12.5  HCT 37.8 37.3  MCV 92.2 93.7  PLT 110* 93*   Basic Metabolic Panel: Recent Labs  Lab 06/05/23 1753 06/06/23 0522 06/07/23 0922 06/08/23 0539 06/09/23 0513  NA 133* 136 140 138 137  K 4.1 3.7 4.2 3.6 3.9  CL 98 101 104 103 104  CO2 25 25 28 26 28   GLUCOSE 129* 93 117* 101* 149*  BUN 38* 35* 26* 22 15  CREATININE 1.77* 1.38* 1.11* 0.85 0.90  CALCIUM 11.7* 11.4* 11.7* 11.9* 10.9*  MG 2.3  --   --   --   --   PHOS  --   --  2.8 2.8  --    GFR: Estimated Creatinine Clearance: 50.9 mL/min (by C-G formula based on SCr of 0.9 mg/dL). Liver Function Tests: Recent Labs  Lab 06/05/23 1753 06/07/23 0922 06/08/23 0539  AST 28  --   --   ALT 23  --   --   ALKPHOS 69  --   --   BILITOT 0.6  --   --   PROT 6.3*  --   --   ALBUMIN 3.4* 2.9* 2.8*   Recent Labs  Lab 06/05/23 1753  LIPASE 25   No results for input(s): AMMONIA in the last 168 hours. Coagulation Profile: No results for input(s): INR, PROTIME in the last 168 hours.  Cardiac Enzymes: No results for input(s): CKTOTAL, CKMB, CKMBINDEX, TROPONINI in the last 168 hours. BNP (last 3 results) No results for input(s): PROBNP in the last 8760 hours. HbA1C: No results for input(s): HGBA1C in the last 72 hours. CBG: Recent Labs  Lab 06/05/23 1815  GLUCAP 114*   Lipid Profile: No results for input(s): CHOL, HDL, LDLCALC, TRIG, CHOLHDL, LDLDIRECT in the last 72 hours. Thyroid Function Tests: No results for input(s): TSH,  T4TOTAL, FREET4, T3FREE, THYROIDAB in the last 72 hours. Anemia Panel: No results for input(s): VITAMINB12, FOLATE, FERRITIN, TIBC, IRON, RETICCTPCT in the last 72 hours. Sepsis Labs: No results for input(s): PROCALCITON, LATICACIDVEN in the last 168 hours.  No results found for this or any previous visit (from the past 240 hours).       Radiology Studies: No results found.      Scheduled Meds:  amLODipine   5 mg Oral Daily   cyclobenzaprine   5 mg Oral TID   enoxaparin  (LOVENOX ) injection  40 mg Subcutaneous Q24H   feeding supplement  237 mL Oral BID BM   oxyCODONE   15 mg Oral Q12H   senna-docusate  1 tablet Oral BID   tamoxifen   20 mg Oral Daily   Continuous Infusions:  dextrose  5 % and 0.45 % NaCl with KCl 20 mEq/L 125 mL/hr at  06/10/23 0745     LOS: 4 days    Time spent: 35 minutes    Renato Applebaum, MD [facility]

## 2023-06-10 NOTE — Plan of Care (Signed)

## 2023-06-10 NOTE — Progress Notes (Signed)
 OT Cancellation Note  Patient Details Name: Darlene Beasley MRN: [id number] DOB: Nov 21, 1955   Cancelled Treatment:    Reason Eval/Treat Not Completed: Patient at procedure or test/ unavailable Patient is off hall for radiation. OT to continue to follow and check back as schedule will allow.  Geofm LEYLAND, MS Acute Rehabilitation Department Office# [phone]  06/10/2023, 8:28 AM

## 2023-06-11 DIAGNOSIS — Z515 Encounter for palliative care: Secondary | ICD-10-CM

## 2023-06-11 DIAGNOSIS — G893 Neoplasm related pain (acute) (chronic): Secondary | ICD-10-CM | POA: Diagnosis not present

## 2023-06-11 DIAGNOSIS — C50919 Malignant neoplasm of unspecified site of unspecified female breast: Secondary | ICD-10-CM

## 2023-06-11 DIAGNOSIS — C7951 Secondary malignant neoplasm of bone: Secondary | ICD-10-CM | POA: Diagnosis not present

## 2023-06-11 LAB — CBC WITH DIFFERENTIAL/PLATELET
Abs Immature Granulocytes: 0.04 10*3/uL (ref 0.00–0.07)
Basophils Absolute: 0 10*3/uL (ref 0.0–0.1)
Basophils Relative: 1 %
Eosinophils Absolute: 0.1 10*3/uL (ref 0.0–0.5)
Eosinophils Relative: 3 %
HCT: 33.6 % — ABNORMAL LOW (ref 36.0–46.0)
Hemoglobin: 11.2 g/dL — ABNORMAL LOW (ref 12.0–15.0)
Immature Granulocytes: 2 %
Lymphocytes Relative: 11 %
Lymphs Abs: 0.3 10*3/uL — ABNORMAL LOW (ref 0.7–4.0)
MCH: 31.5 pg (ref 26.0–34.0)
MCHC: 33.3 g/dL (ref 30.0–36.0)
MCV: 94.4 fL (ref 80.0–100.0)
Monocytes Absolute: 0.2 10*3/uL (ref 0.1–1.0)
Monocytes Relative: 9 %
Neutro Abs: 1.9 10*3/uL (ref 1.7–7.7)
Neutrophils Relative %: 74 %
Platelets: 63 10*3/uL — ABNORMAL LOW (ref 150–400)
RBC: 3.56 MIL/uL — ABNORMAL LOW (ref 3.87–5.11)
RDW: 13.4 % (ref 11.5–15.5)
WBC: 2.6 10*3/uL — ABNORMAL LOW (ref 4.0–10.5)
nRBC: 0 % (ref 0.0–0.2)

## 2023-06-11 LAB — COMPREHENSIVE METABOLIC PANEL
ALT: 21 U/L (ref 0–44)
AST: 27 U/L (ref 15–41)
Albumin: 2.5 g/dL — ABNORMAL LOW (ref 3.5–5.0)
Alkaline Phosphatase: 53 U/L (ref 38–126)
Anion gap: 6 (ref 5–15)
BUN: 13 mg/dL (ref 8–23)
CO2: 26 mmol/L (ref 22–32)
Calcium: 9.1 mg/dL (ref 8.9–10.3)
Chloride: 103 mmol/L (ref 98–111)
Creatinine, Ser: 0.83 mg/dL (ref 0.44–1.00)
GFR, Estimated: >60 mL/min (ref >60)
Glucose, Bld: 92 mg/dL (ref 70–99)
Potassium: 3.7 mmol/L (ref 3.5–5.1)
Sodium: 135 mmol/L (ref 135–145)
Total Bilirubin: 0.5 mg/dL (ref 0.0–1.2)
Total Protein: 5 g/dL — ABNORMAL LOW (ref 6.5–8.1)

## 2023-06-11 LAB — MAGNESIUM: Magnesium: 1.8 mg/dL (ref 1.7–2.4)

## 2023-06-11 LAB — PHOSPHORUS: Phosphorus: 2.9 mg/dL (ref 2.5–4.6)

## 2023-06-11 MED ORDER — ACETAMINOPHEN 325 MG PO TABS: 650.0000 mg | ORAL_TABLET | Freq: Four times a day (QID) | ORAL | Status: DC | PRN

## 2023-06-11 MED ORDER — PANTOPRAZOLE SODIUM 40 MG PO TBEC
40.0000 mg | DELAYED_RELEASE_TABLET | Freq: Every day | ORAL | Status: DC
  Administered 2023-06-11 – 2023-07-03 (×23): 40 mg via ORAL
  Filled 2023-06-11 (×23): qty 1

## 2023-06-11 MED ORDER — KETOROLAC TROMETHAMINE 15 MG/ML IJ SOLN
15.0000 mg | Freq: Four times a day (QID) | INTRAMUSCULAR | Status: AC
  Administered 2023-06-11 – 2023-06-16 (×20): 15 mg via INTRAVENOUS
  Filled 2023-06-11 (×20): qty 1

## 2023-06-11 MED ORDER — DEXAMETHASONE SODIUM PHOSPHATE 10 MG/ML IJ SOLN
10.0000 mg | INTRAMUSCULAR | Status: DC
  Administered 2023-06-11 – 2023-06-15 (×5): 10 mg via INTRAVENOUS
  Filled 2023-06-11 (×5): qty 1

## 2023-06-11 NOTE — Consult Note (Signed)
 Consultation Note Date: 06/11/2023   Patient Name: Darlene Beasley  DOB: January 18, 1956  MRN: [id number]  Age / Sex: 68 y.o., female  PCP: Delores Rojelio Caldron, NP Referring Physician: Jadine Toribio SQUIBB, MD  Reason for Consultation: Pain control  HPI/Patient Profile: 68 y.o. female  with past medical history of metastatic breast cancer admitted on 06/05/2023 with worsening left hip pain and nausea.   Clinical Assessment and Goals of Care: Consult received and chart review completed. Noted recent outpatient palliative visit. Reviewed hospital notes, labs, diagnostics. Noted extensive skeletal mets.   I met today with Darlene Beasley. She is sleepy but able to carry conversation. She has some delayed responses to my questions but seems to have some confusion likely related to pain medication. Overall understanding of her condition. Tells me that she knows the cancer in her bones is bad and that this is not something that is going to go away. She tells me that she is most worried about her significant other of 36 years. She talks of wanting to get legally married. She shares of the loss she has had (her only son) and the struggles over the past years. She has had many challenges. We discussed goal of assisting with pain to make it as tolerable as possible knowing it is not going to go completely away. She is unable to give good description of pain but tells me it is in her left hip. She is okay if she does not move so has been lying in bed mostly in one position. She does not call for pain medication and only takes as it is offered although she has ongoing pain and it is uncontrolled.   We were joined by her significant other, Darlene Beasley, and Darlene Beasley's sister. We reviewed pain and I gave suggestions of medications to add to pain regimen. All agree. Darlene Beasley shares that she is willing to try almost anything. Family at bedside voice concern of her  poor functional status and that they cannot care for her at home like this. They anticipate that they will need significant assistance even if she is able to be a little more functional as they are aware she will continue to have limitations. They are interested in connecting with CMRN/CSW for resources. I encouraged connections through cancer center for resources as well. [name] expresses desire to utilize more natural approach but also understanding that our options are limited and he agrees with adjusting medications as indicated to better manage pain.   All questions/concerns addressed to best of my ability. Emotional support provided.   Primary Decision Maker PATIENT    SUMMARY OF RECOMMENDATIONS   - Patient able to acknowledge poor prognosis with progressed cancer but ongoing discussions needed - Focus today was pain management  Code Status/Advance Care Planning: Full code - did not discuss today   Symptom Management:  Pain left hip: OxyCONTIN  15 mg q12h.  OxyIR 5 mg q4h PRN mod pain.  Morphine  IV q3h PRN severe pain.  Felxeril 5 mg TID.  Continue radiation treatments.  Adding decadron  10 mg IV daily.  Adding toradol  15 mg IV q6h.  May consider bisphosphonates.  Bowel Regimen: LBM 06/06/23. Continue senokot-S 1 tablet BID.   Prognosis:  Overall prognosis poor.   Discharge Planning: To Be Determined      Primary Diagnoses: Present on Admission:  Cancer of overlapping sites of right female breast (HCC)  Intractable pain   I have reviewed the medical record, interviewed the patient and family, and examined the patient. The following aspects are pertinent.  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  Vaping Use   Vaping status: Never Used  Substance and Sexual Activity   Alcohol use: Yes    Alcohol/week: 1.0 standard drink of alcohol    Types: 1 Shots of liquor per week    Comment: Rare   Drug use: Yes    Types: Marijuana    Comment: daily, since age 42    Sexual activity: Not Currently    Birth control/protection: Sponge, Surgical  Other Topics Concern   Not on file  Social History Narrative   Not on file   Social Drivers of Health   Financial Resource Strain: Not on file  Food Insecurity: Food Insecurity Present (05/28/2023)   Hunger Vital Sign    Worried About Running Out of Food in the Last Year: Sometimes true    Ran Out of Food in the Last Year: Never true  Transportation Needs: No Transportation Needs (05/28/2023)   PRAPARE - Administrator, Civil Service (Medical): No    Lack of Transportation (Non-Medical): No  Physical Activity: Sufficiently Active (09/16/2017)   Exercise Vital Sign    Days of Exercise per Week: 7 days    Minutes of Exercise per Session: 80 min  Stress: Stress Concern Present (09/16/2017)   [employer] of Occupational Health - Occupational Stress Questionnaire    Feeling of Stress : Very much  Social Connections: Socially Isolated (06/09/2023)   Social Connection and Isolation Panel [NHANES]    Frequency of Communication with Friends and  Family: Once a week    Frequency of Social Gatherings with Friends and Family: Once a week    Attends Religious Services: Never    Database Administrator or Organizations: No    Attends Engineer, Structural: Never    Marital Status: Living with partner   Family History  Adopted: Yes  Problem Relation Age of Onset   Congestive Heart Failure Mother    Congestive Heart Failure Father  Scheduled Meds:  amLODipine   5 mg Oral Daily   cyclobenzaprine   5 mg Oral TID   enoxaparin  (LOVENOX ) injection  40 mg Subcutaneous Q24H   feeding supplement  237 mL Oral BID BM   oxyCODONE   15 mg Oral Q12H   senna-docusate  1 tablet Oral BID   tamoxifen   20 mg Oral Daily   Continuous Infusions: PRN Meds:.morphine  injection, ondansetron  (ZOFRAN ) IV, oxyCODONE  Allergies  Allergen Reactions   Bee Venom Anaphylaxis and Swelling   Tramadol Swelling and Other (See Comments)    Throat swelling   Review of Systems  Constitutional:  Positive for activity change, appetite change and fatigue.  Respiratory:  Negative for shortness of breath.   Cardiovascular:  Positive for leg swelling.  Gastrointestinal:  Negative for constipation and diarrhea.  Neurological:  Positive for weakness.    Physical Exam Vitals and nursing note reviewed.  Constitutional:      General: She is not in acute distress.    Appearance: She is ill-appearing.  Cardiovascular:     Rate and Rhythm: Normal rate.  Pulmonary:     Effort: No tachypnea, accessory muscle usage or respiratory distress.  Abdominal:     General: Abdomen is flat.  Neurological:     Mental Status: She is alert and oriented to person, place, and time.     Comments: Some confusion noted during conversation but she has complete awareness of her health situation and plan.      Vital Signs: BP 113/75 (BP Location: Left Arm)   Pulse 95   Temp 98.3 F (36.8 C) (Oral)   Resp 18   Ht 5' 4 (1.626 m)   Wt 61.5 kg   SpO2 90%   BMI 23.27 kg/m   Pain Scale: 0-10 POSS *See Group Information*: 1-Acceptable,Awake and alert Pain Score: 8    SpO2: SpO2: 90 % O2 Device:SpO2: 90 % O2 Flow Rate: .O2 Flow Rate (L/min): 2 L/min  IO: Intake/output summary:  Intake/Output Summary (Last 24 hours) at 06/11/2023 1106 Last data filed at 06/11/2023 0900 Gross per 24 hour  Intake 3155.45 ml  Output 600 ml  Net 2555.45 ml    LBM: Last BM Date : 07/07/23 Baseline Weight: Weight: 66.7 kg Most recent weight: Weight: 61.5 kg     Palliative Assessment/Data:    Time Total: 80 min  Greater than 50%  of this time was spent counseling and coordinating care related to the above assessment and plan.  Signed by: Bernarda Kitty, NP Palliative Medicine Team Pager # [phone] (M-F 8a-5p) Team Phone # [phone] (Nights/Weekends)

## 2023-06-11 NOTE — Assessment & Plan Note (Deleted)
 stage IIIA (cT3N1M0), ER+/PR-/HER2-, [id number]; left breast - invasive ductal carcinoma, grade 1, stage IA (cT1bN0M0), ER+/PR+/HER2-, ypT1bN0 -Diagnosed in 09/2017. 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  -She is s/p neoadjuvant chemo with AC-T, left lumpectomy and right mastectomy. She had a very limited response to neoadjuvant chemotherapy. She also completed adjuvant radiation.  -She started antiestrogen therapy with Letrozole  in 10/2018. Plan for 7 years.  She tolerated well.  Due to her osteoporosis, I changed letrozole  to tamoxifen  in September 2023. -She unfortunately developed debilitating pain, especially in the left hip area, to the point not able to walk in dependently.  Bone scan in November 2024 and CT scanning in early December 2024 showed diffuse bone metastasis

## 2023-06-11 NOTE — Hospital Course (Addendum)
 68 year old woman PMH breast cancer status post left lumpectomy, right mastectomy, chemotherapy, radiation, antiestrogen therapy, with newly diagnosed diffuse bone metastatic disease in November who presented with left hip pain and nausea and vomiting.  Previously quite active but since November pain progressed to the point where she was mostly wheelchair-bound.  C Presented to the emergency department 12/26 from radiation oncology.  Admitted for acute on chronic hip pain secondary to malignancy and metastatic disease.  Completed radiation therapy during hospitalization.  Was seen by oncology.  Also seen by palliative medicine.  High risk for right proximal femoral illogic fracture, acetabular fracture present on the left.  Unable to ambulate safely.  Pain limits ambulation anyway.  Consulted orthopedics, not a candidate for surgery at this facility.  Or[facility]opedics has been in touch with [facility], [facility] physician will call patient and family to discuss treatment options.  Patient would like to go home eventually, we will go ahead and planning to maximize home health.  Consultants Oncology  Palliative [facility]   Procedures/Events

## 2023-06-11 NOTE — Plan of Care (Signed)
  Problem: Education: Goal: Knowledge of General Education information will improve Description: Including pain rating scale, medication(s)/side effects and non-pharmacologic comfort measures Outcome: Progressing   Problem: Health Behavior/Discharge Planning: Goal: Ability to manage health-related needs will improve Outcome: Progressing   Problem: Clinical Measurements: Goal: Ability to maintain clinical measurements within normal limits will improve Outcome: Progressing Goal: Will remain free from infection Outcome: Progressing Goal: Diagnostic test results will improve Outcome: Progressing Goal: Respiratory complications will improve Outcome: Progressing Goal: Cardiovascular complication will be avoided Outcome: Progressing   Problem: Activity: Goal: Risk for activity intolerance will decrease Outcome: Progressing   Problem: Nutrition: Goal: Adequate nutrition will be maintained Outcome: Progressing   Problem: Pain Management: Goal: General experience of comfort will improve Outcome: Progressing   Problem: Safety: Goal: Ability to remain free from injury will improve Outcome: Progressing   Problem: Skin Integrity: Goal: Risk for impaired skin integrity will decrease Outcome: Progressing

## 2023-06-11 NOTE — Progress Notes (Signed)
  Progress Note   Patient: Darlene Beasley FMW:[id number] DOB: 1955-11-06 DOA: 06/05/2023     5 DOS: the patient was seen and examined on 06/11/2023   Brief hospital course:  Assessment and Plan: Metastatic breast cancer with lytic lesions of her pelvis spine and femur.   Hip pain Hypercalcemia of malignancy treated with Zometa  12/29 and now resolved. Pain better controlled today.  Continue pain control. Follow-up further recommendations per Dr. Lanny Will engage orthopedics for opinion in regard to prophylactic treatment to prevent pathologic fractures.   Essential hypertension Blood pressure stable on amlodipine .   Acute kidney injury Resolved.      Subjective:  Pain better controlled Can move leg a little Would proceed with orthopedic surgery if recommended  Physical Exam: Vitals:   06/10/23 1330 06/10/23 2100 06/11/23 0452 06/11/23 1344  BP: 107/63 125/74 113/75 106/74  Pulse: 95 94 95 91  Resp: 20 20 18 16   Temp: 97.9 F (36.6 C) 97.9 F (36.6 C) 98.3 F (36.8 C) 98.2 F (36.8 C)  TempSrc: Oral Oral Oral Oral  SpO2: (!) 88% 94% 90% 92%  Weight:      Height:       Physical Exam Vitals reviewed.  Constitutional:      General: She is not in acute distress.    Appearance: She is not ill-appearing or toxic-appearing.  Cardiovascular:     Rate and Rhythm: Normal rate and regular rhythm.     Heart sounds: No murmur heard. Pulmonary:     Effort: Pulmonary effort is normal. No respiratory distress.     Breath sounds: No wheezing, rhonchi or rales.  Musculoskeletal:     Comments: Can move legs a bit  Neurological:     Mental Status: She is alert.  Psychiatric:        Mood and Affect: Mood normal.        Behavior: Behavior normal.     Data Reviewed: CMP noted Hgb 11.2 Plts 63  Family Communication: none present  Disposition: Status is: Inpatient Remains inpatient appropriate because: poorly controlled pain     Time spent: 20  minutes  Author: Toribio Door, MD 06/11/2023 3:29 PM  For on call review [email or web address].

## 2023-06-11 NOTE — Evaluation (Signed)
 Occupational Therapy Evaluation Patient Details Name: Darlene Beasley MRN: [id number] DOB: [birth year] Today's Date: 06/11/2023   History of Present Illness 68 y.o. female who presents with worsening left hip pain and nausea and vomiting and admitted 06/05/23 for Left hip pain/heavy metastatic burden in the right hip:  -Secondary to significant diffuse and extensive pelvic bone and hip metastasis. S/p left iliac bone biopsy 12/24 and awaiting results.  Presented with pain mostly to the left hip for which she is currently receiving palliative radiation since last week.   Past medical history significant of breast cancer s/p left lumpectomy, right mastectomy, chemotherapy, radiation on antiestrogen therapy with new diffuse bone metastasis diagnosed in November .   Clinical Impression   Patient and significant other present in room with patient declining to participate in session. Patient and significant other were educated on OT role in continuum of care and importance of establishing safe ways to move to be able to transition home with least caregiver burden. Patient continued to decline reporting she did not want therapy but would think about it when she got home. Patient was educated on positioning in bed and continued movement as tolerated to maintain functional ability. Patient and significant other verbalized understanding. Patient requesting OT sign off at this time. Please re order if there is a change in patient willingness to participate.       If plan is discharge home, recommend the following: Two people to help with walking and/or transfers;Two people to help with bathing/dressing/bathroom;Assistance with cooking/housework;Direct supervision/assist for medications management;Assist for transportation;Help with stairs or ramp for entrance;Direct supervision/assist for financial management    Functional Status Assessment  Patient has had a recent decline in their functional status and/or  demonstrates limited ability to make significant improvements in function in a reasonable and predictable amount of time  Equipment Recommendations  BSC/3in1       Precautions / Restrictions Precautions Precautions: Fall Precaution Comments: pelvic X-ray could not exclude pathologic fracture of the right hip due to diffuse metastasis Restrictions Weight Bearing Restrictions Per Provider Order: No      Mobility Bed Mobility               General bed mobility comments: patient declined to move reporting she did not want to feel more pain.           ADL either performed or assessed with clinical judgement   ADL         General ADL Comments: patient was able to reach to bedside table on R side of bed to get drink and take sips with no spillage. patient and spouse indicating plan was for patient to transition home at time of d/c. patient was not open to engagement in session on this date reporting when she got home she would do it. patient was educated on importacne of movement, concerns over increased time in bed, and positioning to reduce pain with movement. patient verbalized understanding but declined to move. patietn reproted she would stand up when she got home but declined to attempt/maintain ability to do so while here. patient's significant other in room was upset with diagnosis and how long it took to establish it. patient and significant other were educated on speaking with MD about this and continuing to advocate for themselves with medical decisions. patient and significant other verbalized understanding.                  Pertinent Vitals/Pain Pain Assessment Pain Assessment: Faces Faces Pain Scale:  Hurts worst Pain Location: hips and LEs at rest Pain Descriptors / Indicators: Guarding, Grimacing Pain Intervention(s): Limited activity within patient's tolerance, Premedicated before session     Extremity/Trunk Assessment Upper Extremity Assessment Upper  Extremity Assessment: Generalized weakness (declined formal assessment)   Lower Extremity Assessment Lower Extremity Assessment: Defer to PT evaluation       Communication Communication Communication: No apparent difficulties   Cognition Arousal: Alert Behavior During Therapy: WFL for tasks assessed/performed, Flat affect Overall Cognitive Status: Within Functional Limits for tasks assessed         General Comments: patients significant other was present during session.                Home Living Family/patient expects to be discharged to:: Private residence Living Arrangements: Spouse/significant other Available Help at Discharge: Family;Available PRN/intermittently Type of Home: House Home Access: Stairs to enter Entrance Stairs-Number of Steps: 2   Home Layout: Able to live on main level with bedroom/bathroom               Home Equipment: Agricultural Consultant (2 wheels);Cane - single point;Wheelchair - manual   Additional Comments: living room requires 2 steps      Prior Functioning/Environment Prior Level of Function : Needs assist             Mobility Comments: only transfers to w/c at this time due to pain, assist from spouse for transfers and for steps with w/c ADLs Comments: has been using a pot like bed pan type situation for toileting                 OT Goals(Current goals can be found in the care plan section) Acute Rehab OT Goals Patient Stated Goal: to go home OT Goal Formulation: All assessment and education complete, DC therapy  OT Frequency:         AM-PAC OT 6 Clicks Daily Activity     Outcome Measure Help from another person eating meals?: A Little Help from another person taking care of personal grooming?: A Little Help from another person toileting, which includes using toliet, bedpan, or urinal?: Total Help from another person bathing (including washing, rinsing, drying)?: Total Help from another person to put on and taking  off regular upper body clothing?: Total Help from another person to put on and taking off regular lower body clothing?: Total 6 Click Score: 10   End of Session Nurse Communication: Mobility status  Activity Tolerance: Patient limited by pain Patient left: in bed;with call bell/phone within reach;with family/visitor present                   Time: 9059-9047 OT Time Calculation (min): 12 min Charges:  OT General Charges $OT Visit: 1 Visit OT Evaluation $OT Eval Low Complexity: 1 Low  Tanyia Grabbe OTR/L, MS Acute Rehabilitation Department Office# [phone]   Geofm CHRISTELLA Dance 06/11/2023, 12:02 PM

## 2023-06-12 ENCOUNTER — Inpatient Hospital Stay: Payer: 59

## 2023-06-12 ENCOUNTER — Inpatient Hospital Stay: Payer: 59 | Admitting: Hematology

## 2023-06-12 ENCOUNTER — Ambulatory Visit
Admission: RE | Admit: 2023-06-12 | Discharge: 2023-06-12 | Disposition: A | Discharge status: Home / Self Care | Payer: 59 | Source: Ambulatory Visit | Attending: Radiation Oncology | Admitting: Radiation Oncology

## 2023-06-12 ENCOUNTER — Inpatient Hospital Stay: Payer: 59 | Admitting: Nurse Practitioner

## 2023-06-12 ENCOUNTER — Inpatient Hospital Stay (HOSPITAL_COMMUNITY): Payer: 59

## 2023-06-12 ENCOUNTER — Ambulatory Visit: Payer: 59

## 2023-06-12 ENCOUNTER — Other Ambulatory Visit: Payer: Self-pay

## 2023-06-12 DIAGNOSIS — Z7189 Other specified counseling: Secondary | ICD-10-CM | POA: Diagnosis not present

## 2023-06-12 DIAGNOSIS — C50811 Malignant neoplasm of overlapping sites of right female breast: Secondary | ICD-10-CM | POA: Diagnosis not present

## 2023-06-12 DIAGNOSIS — Z510 Encounter for antineoplastic radiation therapy: Secondary | ICD-10-CM | POA: Insufficient documentation

## 2023-06-12 DIAGNOSIS — C50412 Malignant neoplasm of upper-outer quadrant of left female breast: Secondary | ICD-10-CM | POA: Diagnosis not present

## 2023-06-12 DIAGNOSIS — Z170 Estrogen receptor positive status [ER+]: Secondary | ICD-10-CM | POA: Insufficient documentation

## 2023-06-12 DIAGNOSIS — C7951 Secondary malignant neoplasm of bone: Secondary | ICD-10-CM | POA: Diagnosis not present

## 2023-06-12 DIAGNOSIS — M25552 Pain in left hip: Secondary | ICD-10-CM | POA: Diagnosis not present

## 2023-06-12 DIAGNOSIS — G893 Neoplasm related pain (acute) (chronic): Secondary | ICD-10-CM | POA: Diagnosis not present

## 2023-06-12 DIAGNOSIS — Z515 Encounter for palliative care: Secondary | ICD-10-CM | POA: Diagnosis not present

## 2023-06-12 LAB — RAD ONC ARIA SESSION SUMMARY
Course Elapsed Days: 14
Plan Fractions Treated to Date: 8
Plan Fractions Treated to Date: 8
Plan Fractions Treated to Date: 8
Plan Prescribed Dose Per Fraction: 3 Gy
Plan Prescribed Dose Per Fraction: 3 Gy
Plan Prescribed Dose Per Fraction: 3 Gy
Plan Total Fractions Prescribed: 10
Plan Total Fractions Prescribed: 10
Plan Total Fractions Prescribed: 10
Plan Total Prescribed Dose: 30 Gy
Plan Total Prescribed Dose: 30 Gy
Plan Total Prescribed Dose: 30 Gy
Reference Point Dosage Given to Date: 24 Gy
Reference Point Dosage Given to Date: 24 Gy
Reference Point Dosage Given to Date: 24 Gy
Reference Point Session Dosage Given: 3 Gy
Reference Point Session Dosage Given: 3 Gy
Reference Point Session Dosage Given: 3 Gy
Session Number: 8

## 2023-06-12 MED ORDER — GADOBUTROL 1 MMOL/ML IV SOLN
6.0000 mL | Freq: Once | INTRAVENOUS | Status: AC | PRN
  Administered 2023-06-12: 6 mL via INTRAVENOUS

## 2023-06-12 MED ORDER — LETROZOLE 2.5 MG PO TABS
2.5000 mg | ORAL_TABLET | Freq: Every day | ORAL | Status: DC
Start: 2023-06-12 — End: 2023-07-03
  Administered 2023-06-12 – 2023-07-03 (×22): 2.5 mg via ORAL
  Filled 2023-06-12 (×22): qty 1

## 2023-06-12 NOTE — Progress Notes (Signed)
  Progress Note   Patient: Darlene Beasley FMW:[id number] DOB: 1956/03/02 DOA: 06/05/2023     6 DOS: the patient was seen and examined on 06/12/2023   Brief hospital course: 68 year old woman PMH breast cancer status post left lumpectomy, right mastectomy, chemotherapy, radiation, antiestrogen therapy, with newly diagnosed diffuse bone metastatic disease in November who presented with left hip pain and nausea and vomiting.  Previously quite active but since November pain progressed to the point where she was mostly wheelchair-bound.  Receiving radiation therapy for pain.  Presented to the emergency department 12/26 from radiation oncology.  Admitted for acute on chronic hip pain secondary to malignancy and metastatic disease  Consultants Oncology  Palliative [facility]   Procedures/Events   Assessment and Plan: Metastatic breast cancer with lytic lesions of her pelvis, spine and femur.   Concern for impending pathologic fracture right proximal femur Concern for left acetabular fracture causing left hip pain Soft tissue mass L1 Left hip pain Hypercalcemia of malignancy treated with Zometa  12/29 and now resolved. Continue pain control.  Appreciate palliative medicine care. Discussed in detail with Dr. Burnetta orthopedics.  Right hip CT ordered, results noted.  Left CT hip and MRI lumbar spine pending.  Tentative plan for operative intervention on the right femur and left acetabulum if fracture confirmed.  Nausea and vomiting Adult failure to thrive  Pancytopenia Probably related to recent radiation diffuse bony metastasis.  Monitor.  Essential hypertension Blood pressure stable on amlodipine .   Acute kidney injury Secondary to failure to thrive  Resolved.  Hyponatremia Resolved.  Acute hypoxia secondary to IV opioid, no mention of distress.  Respiratory failure ruled out at this time.    Subjective:  Pain in left hip  Physical Exam: Vitals:   06/11/23 1344 06/11/23  2041 06/12/23 0618 06/12/23 1349  BP: 106/74 111/76 120/74 97/63  Pulse: 91 93 86 91  Resp: 16 18 18 16   Temp: 98.2 F (36.8 C) 98.2 F (36.8 C) 98.1 F (36.7 C) 98.5 F (36.9 C)  TempSrc: Oral Oral Oral Oral  SpO2: 92% 93% 93% 95%  Weight:      Height:       Physical Exam Vitals reviewed.  Constitutional:      General: She is not in acute distress.    Appearance: She is not ill-appearing or toxic-appearing.  Cardiovascular:     Rate and Rhythm: Normal rate and regular rhythm.     Heart sounds: No murmur heard. Pulmonary:     Effort: Pulmonary effort is normal. No respiratory distress.     Breath sounds: No wheezing, rhonchi or rales.  Neurological:     Mental Status: She is alert.  Psychiatric:        Mood and Affect: Mood normal.        Behavior: Behavior normal.     Data Reviewed: CMP noted WBC stable 2.6 CT hip noted  Family Communication: significant other at bedside  Disposition: Status is: Inpatient Remains inpatient appropriate because: uncontrolled pain     Time spent: 45 minutes  Author: Toribio Door, MD 06/12/2023 6:45 PM  For on call review [email or web address].

## 2023-06-12 NOTE — Consult Note (Signed)
 Darlene Beasley Date of birth 04-Feb-1956 Date of admission: 06/05/2023   History:  Darlene Beasley is a pleasant 68 year old woman with known metastatic breast cancer who has been having progressive left groin/hip pain for the last several weeks.  The patient was admitted to the hospital on 06/05/2023 with worsening left hip pain, nausea, and vomiting.  Patient has had a left lumpectomy, right mastectomy, chemotherapy, radiation, with a new diffuse bone metastasis diagnosis in November.  The patient was ambulating and working in July 2024 but then rapidly deteriorated and was diagnosed with multiple bony metastasis in November.  Patient went from using a cane to a walker and she is now mostly wheelchair-bound due to her left hip pain.  She denies any right hip pain.  As a result of the significant pain and the inability to ambulate orthopedic consultation was requested today.   Review of systems: Patient denies any recent loss of consciousness, headaches, blurry vision.  Positive nausea and vomiting with progressive pain and loss in ability to function at home/perform activities of daily living.    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   Current Outpatient Medications on File Prior to Encounter  Medication Sig Dispense Refill   amLODipine  (NORVASC ) 5 MG tablet Take 1 tablet (5 mg total) by mouth daily. 90  tablet 1   dexamethasone  (DECADRON ) 4 MG tablet Take 1 tablet (4 mg total) by mouth 2 (two) times daily with a meal. 21 tablet 0   ondansetron  (ZOFRAN ) 8 MG tablet Take 0.5 tablets (4 mg total) by mouth every 8 (eight) hours as needed for nausea or vomiting. 45 tablet 1   oxyCODONE  (OXY IR/ROXICODONE ) 5 MG immediate release tablet Take 1-2 tablets (5-10 mg total) by mouth every 4 (four) hours as needed for severe pain (pain score 7-10) or breakthrough pain. 90 tablet 0   oxyCODONE  ER (XTAMPZA  ER) 9 MG C12A Take 9 mg by mouth every 12 (twelve) hours. 30 capsule 0   prochlorperazine  (COMPAZINE ) 10 MG tablet Take 1 tablet (10 mg total) by mouth every 6 (six) hours as needed for nausea or vomiting. 30 tablet 3   tamoxifen  (NOLVADEX ) 20 MG tablet Take 1 tablet (20 mg total) by mouth daily. 90 tablet 3   docusate sodium  (COLACE) 50 MG capsule Take 50 mg by mouth 2 (two) times daily. (Patient not taking: Reported on 06/07/2023)      Physical Exam: Vitals:   06/12/23 0618 06/12/23 1349  BP: 120/74 97/63  Pulse: 86 91  Resp: 18 16  Temp: 98.1 F (36.7 C) 98.5 F (36.9 C)  SpO2: 93% 95%   Body mass index is 23.27 kg/m. She is alert and oriented x 3. No shortness of breath or chest pain at present. Abdomen is soft and nontender.  She denies any incontinence of bowel and bladder. Full range  of motion in the upper extremity bilaterally.  No pain with range of motion of the shoulder, elbow, wrist or palpation in the upper extremities.  Patient has pain and tenderness in the left hemipelvis with direct palpation over the iliac crest.   She has significant left anterior groin and lateral thigh pain with palpation.  Patient does not tolerate gentle attempts at pad passive range of motion of the left hip.  No pain or swelling noted with palpation at the knee or ankle. She has no pain with passive range of motion of the right hip/knee/ankle.  No pain with palpation along the femur or tibia. 2+ dorsalis  pedis/posterior tibialis pulses bilaterally.  Compartments are soft and nontender. 5/5 motor strength in the upper and lower extremity bilaterally.  Difficult to truly assess the left lower extremity because of referred pain in the hip/groin.  Negative Babinski test, symmetrical 1+ deep tendon reflexes.  Sensation to light touch is intact.  Image: DG HIP UNILAT WITH PELVIS 2-3 VIEWS LEFT Result Date: 06/05/2023 CLINICAL DATA:  Left hip pain, metastatic breast cancer with skeletal involvement EXAM: DG HIP (WITH OR WITHOUT PELVIS) 2-3V LEFT COMPARISON:  CT pelvis 05/16/2023 FINDINGS: Stable sclerotic lesion in the left greater trochanter. This likely represents a treated metastatic lesion. No fracture the left proximal femur is identified. However, there is ill-defined abnormal lucency scattered throughout the bony pelvis most confluent in the left iliac bone and left sacrum compatible with lytic metastatic lesions and better shown on the CT from 05/16/2023. Pathologic pelvic fracture difficult to exclude given the heavy burden of osseous metastatic disease. There is lytic involvement in the right (contralateral) femoral head, femoral neck, and proximal metaphysis. IMPRESSION: 1. Heavy burden of lytic osseous metastatic disease in the bony pelvis and right (contralateral) femur. Pathologic pelvic fracture difficult to exclude given the heavy burden of osseous metastatic disease. 2. Stable sclerotic lesion in the left greater trochanter, likely a treated metastatic lesion. 3. No fracture of the left proximal femur is identified. Electronically Signed   By: Ryan Salvage M.D.   On: 06/05/2023 18:11   CT Biopsy Result Date: 06/03/2023 INDICATION: biopsy of hypermetabolic bone lesion to confirm bone metastasis EXAM: CT GUIDED BONE LESION BIOPSY MEDICATIONS: None. ANESTHESIA/SEDATION: Moderate (conscious) sedation was employed during this procedure. A total of Versed  1.5 mg and Fentanyl  100 mcg was  administered intravenously. Moderate Sedation Time: 20 minutes. The patient's level of consciousness and vital signs were monitored continuously by radiology nursing throughout the procedure under my direct supervision. FLUOROSCOPY TIME:  CT dose; 115 mGycm COMPLICATIONS: None immediate. Estimated blood loss: <5 mL PROCEDURE: RADIATION DOSE REDUCTION: This exam was performed according to the departmental dose-optimization program which includes automated exposure control, adjustment of the mA and/or kV according to patient size and/or use of iterative reconstruction technique. Informed written consent was obtained from the the patient and/or patient's representative after a thorough discussion of the procedural risks, benefits and alternatives. All questions were addressed. Maximal Sterile Barrier Technique was utilized including caps, mask, sterile gowns, sterile gloves, sterile drape, hand hygiene and skin antiseptic. A timeout was performed prior to the initiation of the procedure. The patient was positioned prone and non-contrast localization CT was performed of the pelvis to demonstrate the LEFT iliac bone lesion. Maximal barrier sterile technique utilized including caps, mask, sterile gowns, sterile gloves, large sterile drape, hand hygiene, and chlorhexidine  prep. Under sterile conditions and local anesthesia, an 11 gauge coaxial bone biopsy needle was advanced into the LEFT  iliac bone lesion. Needle position was confirmed with CT imaging. Initially, bone lesion aspiration was performed. Next, the 11 gauge outer cannula was utilized to obtain a LEFT iliac bone core biopsy. Needle was removed. Hemostasis was obtained with compression. The patient tolerated the procedure well. Samples were prepared with the cytotechnologist. IMPRESSION: Successful CT-guided LEFT iliac bone lesion biopsy. Thom Hall, MD [facility] Electronically Signed   By: Thom Hall M.D.   On: 06/03/2023 16:36   CT CHEST ABDOMEN PELVIS W CONTRAST Result Date: 05/21/2023 CLINICAL DATA:  Breast cancer staging, prior to preoperative systemic therapy * Tracking Code: BO * EXAM: CT CHEST, ABDOMEN, AND PELVIS WITH CONTRAST TECHNIQUE: Multidetector CT imaging of the chest, abdomen and pelvis was performed following the standard protocol during bolus administration of intravenous contrast. RADIATION DOSE REDUCTION: This exam was performed according to the departmental dose-optimization program which includes automated exposure control, adjustment of the mA and/or kV according to patient size and/or use of iterative reconstruction technique. CONTRAST:  100mL OMNIPAQUE  IOHEXOL  300 MG/ML  SOLN COMPARISON:  None Available. FINDINGS: CT CHEST FINDINGS Cardiovascular: Aortic atherosclerosis. Normal heart size. Left and right coronary artery calcifications. No pericardial effusion. Mediastinum/Nodes: Enlarged supraclavicular and lower cervical lymph nodes measuring up to 1.1 x 0.9 cm (series 2, image 8). Small prominent left axillary lymph nodes measuring up to 1.0 x 0.7 cm (series 2, image 21). Enlarged subcarinal and left hilar lymph nodes measuring up to 0.7 x 1.6 cm (series 2, image 32). Thyroid gland, trachea, and esophagus demonstrate no significant findings. Lungs/Pleura: Mild centrilobular and paraseptal emphysema. Diffuse bilateral bronchial wall thickening. Infrahilar nodule of the left lower lobe, measuring 1.9 x 1.1 cm (series 4, image 94). Clustered nodularity distally in the left lower lobe, likely postobstructive airspace disease. Subpleural radiation fibrosis of the anterior right lung (series 4, image 70). No pleural effusion or pneumothorax. Musculoskeletal: Status post right mastectomy and axillary lymph node dissection. Status post left lumpectomy and axillary lymph node dissection with extensive overlying skin thickening of the left breast. No acute osseous findings. CT  ABDOMEN PELVIS FINDINGS Hepatobiliary: No solid liver abnormality is seen. Simple benign left liver cyst. No gallstones, gallbladder wall thickening, or biliary dilatation. Pancreas: Unremarkable. No pancreatic ductal dilatation or surrounding inflammatory changes. Spleen: Normal in size without significant abnormality. Adrenals/Urinary Tract: Nodularity of the bilateral adrenal glands, likely reflecting metastases, largest on the left measuring 1.5 x 1.2 cm (series 2, image 61). Kidneys are normal, without renal calculi, solid lesion, or hydronephrosis. Bladder is unremarkable. Stomach/Bowel: Stomach is within normal limits. Appendix not clearly visualized. No evidence of bowel wall thickening, distention, or inflammatory changes. Vascular/Lymphatic: Aortic atherosclerosis. No enlarged abdominal or pelvic lymph nodes. Reproductive: No mass or other abnormality. Other: No abdominal wall hernia or abnormality. No ascites. Musculoskeletal: No acute osseous findings. Widespread lytic osseous metastatic disease throughout the included axial and proximal appendicular skeleton. Several lesions have significant soft tissue components, particularly notable for a large lesion of the right aspect of L1 with significant epidural intrusion measuring 5.0 x 4.0 cm (series 2, image 60). Osseous metastatic disease also notable for large lytic lesions involving the left hemipelvis and right femoral head and neck (series 2, image 96, 110, 117). IMPRESSION: 1. Status post right mastectomy and axillary lymph node dissection. Status post left lumpectomy and axillary lymph node dissection with extensive overlying skin thickening of the left breast. Small prominent left axillary lymph nodes, likely nodal metastases. 2. Widespread lytic osseous  metastatic disease throughout the included axial and proximal appendicular skeleton. 3. Several lesions have significant soft tissue components, particularly notable for a large lesion of the right  aspect of L1 with significant epidural intrusion, measuring 5.0 x 4.0 cm. 4. Osseous metastatic disease also notable for large lytic lesions involving the left hemipelvis and right femoral head and neck, possibly at risk for pathologic fracture. 5. Enlarged supraclavicular and lower cervical lymph nodes, as well as enlarged subcarinal left hilar lymph nodes and a left lower lobe pulmonary nodule consistent with metastatic disease. 6. Nodularity of the bilateral adrenal glands, likely reflecting metastases. 7. Emphysema. 8. Coronary artery disease. These results will be called to the ordering clinician or representative by the Radiologist Assistant, and communication documented in the PACS or [employer]. Aortic Atherosclerosis (ICD10-I70.0) and Emphysema (ICD10-J43.9). Electronically Signed   By: Marolyn JONETTA Jaksch M.D.   On: 05/21/2023 14:51    A/P: Darlene Beasley is a pleasant 68 year old woman with known metastatic breast cancer who has been having progressive left groin/hip pain for the last several weeks.  The patient was admitted to the hospital on 06/05/2023 with worsening left hip pain, nausea, and vomiting.  Patient has had a left lumpectomy, right mastectomy, chemotherapy, radiation, with a new diffuse bone metastasis diagnosis in November.  The patient was ambulating and working in July 2024 but then rapidly deteriorated and was diagnosed with multiple bony metastasis in November.  Patient went from using a cane to a walker and she is now mostly wheelchair-bound due to her left hip pain.  She denies any right hip pain.  Since her admission the patient has continued to complain of left hip pain despite opioid medications.   A CT scan of the abdomen and pelvis completed on 06/05/2023 demonstrated widespread lytic osseous metastatic disease.  She had a large lesion of the right aspect of L1 with significant epidural intrusion.  She had a large lytic lesion involving the left hemipelvis and right femoral neck and  head.  The patient underwent a left iliac crest biopsy on 06/03/2023 which indicated metastatic carcinoma consistent with breast primary.  I was contacted today by the treating medical team concerning the lytic destruction of the right hip and her severe left groin/hip pain.  CT scan of the right hip demonstrates multiple lytic lesions in the proximal femur consistent with metastatic disease.  This involves the weightbearing portion of the femoral neck.  Recommendations: 1.  Will speak to our Ortho trauma specialist about reviewing the CT scans to determine if prophylactic IM nailing of the right femur is required as well as potential treatment for the lytic lesions in the left hemipelvis.  X-ray of the pelvis demonstrates a questionable left pathological acetabular fracture. 2.  Patient's imaging studies demonstrated an stable sclerotic bone island within the greater trochanter but no lytic destruction of the femoral neck or head. 3.  Patient has a significant soft tissue mass at L1 which has not been fully evaluated.  I am recommending an MRI with contrast of the lumbar spine.   4.  Recommend CT scan of the left hip to further evaluate the possible pathological acetabular fracture.  Further recommendations pending review of that MRI and CT scan

## 2023-06-12 NOTE — Progress Notes (Signed)
 Palliative:  HPI: 68 y.o. female  with past medical history of metastatic breast cancer admitted on 06/05/2023 with worsening left hip pain and nausea.   I met today with Darlene Beasley. No family at bedside. She continues to have severe pain. She does not have pain currently but still severe pain with any movement. She is able to move her feet. She tries to bend knees but limited by pain especially on left side. She continues to have good understanding of bone mets and overall poor prognosis. She does have some confusion and it is sometimes difficult to maintain concentration on conversation at hand. We discussed HCPOA. She has a difficult time determining who she would want to be her HCPOA. She believes that she may want her step-daughter, Darlene Beasley. We talked through the importance of HCPOA to be the person that would make the decisions she would want for herself. She does admit that she believes this would be too difficult for Darlene Beasley to make difficult decisions for her. She would like to meet and discuss with her children and grandchildren to discuss this further.   I also discussed with Darlene Beasley her wishes if her health were to decline further. We discussed resuscitation. She confirms desire for full code. We did speak about limitations of resuscitation in the setting of advancing and terminal cancer. She ponders and acknowledges that resuscitation may not make sense at some point for her. Due to her confusion and concentration we did not discuss further.   Plans to await surgery input.   All questions/concerns addressed. Emotional support provided.   Exam: Alert. Some confusion and concentration. Ill-appearing. No distress. Breathing regular, unlabored. Abd soft.   Symptom Management:  Pain left hip: OxyCONTIN  15 mg q12h.  OxyIR 5 mg q4h PRN mod pain.  Morphine  IV q3h PRN severe pain.  Felxeril 5 mg TID.  Continue radiation treatments.  Adding decadron  10 mg IV daily.  Adding toradol  15 mg IV  q6h.  Zometa  12/29.  Bowel Regimen: LBM 06/06/23. Continue senokot-S 1 tablet BID.  50 min  Bernarda Kitty, NP Palliative Medicine Team Pager [phone] (Please see [email or web address] for schedule) Team Phone [phone]

## 2023-06-12 NOTE — Progress Notes (Addendum)
 Darlene Beasley LABOR [facility]   DOB:Sep 03, 1955   [id number]      ASSESSMENT & PLAN:  1.  Metastatic breast cancer with lytic lesions to pelvis, spine, femur - Newly diagnosed metastatic disease to bone. - Initially diagnosed with breast cancer in 2019.  Status post right mastectomy and left lumpectomy.  She has had neoadjuvant chemotherapy, radiation therapy, hormonal therapy.  She has been on tamoxifen  x 1 year.  Currently on letrozole  and Verzenio. - Patient presented with worsening left leg pain - Seen by palliative for pain management - Ortho consulted for eval and potential intervention to right hip. - CT-guided left iliac bone lesion biopsy done 06/03/2023 which showed metastatic carcinoma consistent with breast primary. - CT of the right hip was done today 06/12/23, results pending - Medical oncology/Dr. Lanny following closely for ongoing treatment and management.  2.  Hypercalcemia of malignancy - May be source of confusion - Bisphosphonate/Zometa  administered 06/08/2023 - Calcium repeated on 06/11/2023 was 9.1. - Continue to monitor calcium levels  3.  Thrombocytopenia - Noted drop in platelet count, 63K on 06/11/2023 - Transfuse for platelets less than 20K or less than 50K with active bleeding - No transfusional support required at this time - Monitor CBC with differential  4.  Anemia, microcytic - Mild - Monitor CBC with differential    Code Status Full  Discharge planning: Home when medically optimized  Subjective:  Patient seen awake and alert laying in bed.  She reports that her left leg pain has improved.  Denies shortness of breath, nausea, vomiting.  Denies new acute complaints.  Spouse at bedside.  Questionable confusion noted, patient asking to remove popcorn off the bed in the absence of popcorn.  No other acute distress noted.  Objective:  Vitals:   06/11/23 2041 06/12/23 0618  BP: 111/76 120/74  Pulse: 93 86  Resp: 18 18  Temp: 98.2 F (36.8 C) 98.1 F (36.7 C)   SpO2: 93% 93%     Intake/Output Summary (Last 24 hours) at 06/12/2023 1116 Last data filed at 06/12/2023 9180 Gross per 24 hour  Intake 360 ml  Output 550 ml  Net -190 ml     REVIEW OF SYSTEMS:   Constitutional: Denies fevers, chills or abnormal night sweats Eyes: Denies blurriness of vision, double vision or watery eyes Ears, nose, mouth, throat, and face: Denies mucositis or sore throat Respiratory: Denies cough, dyspnea or wheezes Cardiovascular: Denies palpitation, chest discomfort or lower extremity swelling Gastrointestinal:  Denies nausea, heartburn or change in bowel habits Skin: Denies abnormal skin rashes Lymphatics: Denies new lymphadenopathy or easy bruising Musculoskeletal: +Left leg pain Neurological: Denies numbness, tingling or new weaknesses Behavioral/Psych: Mood is stable, no new changes  All other systems were reviewed with the patient and are negative.  PHYSICAL EXAMINATION: ECOG PERFORMANCE STATUS: 3 - Symptomatic, >50% confined to bed  Vitals:   06/11/23 2041 06/12/23 0618  BP: 111/76 120/74  Pulse: 93 86  Resp: 18 18  Temp: 98.2 F (36.8 C) 98.1 F (36.7 C)  SpO2: 93% 93%   Filed Weights   06/05/23 1644 06/06/23 0205  Weight: 147 lb (66.7 kg) 135 lb 9.3 oz (61.5 kg)    GENERAL: alert, +ill-appearing, +frail-appearing SKIN: +Pale skin color, texture, turgor are normal, no rashes or significant lesions EYES: normal, conjunctiva are pink and non-injected, sclera clear OROPHARYNX: no exudate, no erythema and lips, buccal mucosa, and tongue normal  NECK: supple, thyroid normal size, non-tender, without nodularity LYMPH: no palpable lymphadenopathy in the cervical,  axillary or inguinal LUNGS: clear to auscultation and percussion with normal breathing effort HEART: regular rate & rhythm and no murmurs and no lower extremity edema ABDOMEN: abdomen soft, non-tender and normal bowel sounds MUSCULOSKELETAL: + Left lower extremity PSYCH: alert &  oriented x 3 with fluent speech NEURO: no focal motor/sensory deficits   All questions were answered. The patient knows to call the clinic with any problems, questions or concerns.   The total time spent in the appointment was 40 minutes encounter with patient including review of chart and various tests results, discussions about plan of care and coordination of care plan  Olam JINNY Brunner, NP 06/12/2023 11:16 AM    Labs Reviewed:  Lab Results  Component Value Date   WBC 2.6 (L) 06/11/2023   HGB 11.2 (L) 06/11/2023   HCT 33.6 (L) 06/11/2023   MCV 94.4 06/11/2023   PLT 63 (L) 06/11/2023   Recent Labs    05/02/23 1449 06/05/23 1753 06/06/23 0522 06/07/23 0922 06/08/23 0539 06/09/23 0513 06/11/23 0721  NA 138 133*   < > 140 138 137 135  K 4.2 4.1   < > 4.2 3.6 3.9 3.7  CL 103 98   < > 104 103 104 103  CO2 28 25   < > 28 26 28 26   GLUCOSE 98 129*   < > 117* 101* 149* 92  BUN 21 38*   < > 26* 22 15 13   CREATININE 0.79 1.77*   < > 1.11* 0.85 0.90 0.83  CALCIUM 10.0 11.7*   < > 11.7* 11.9* 10.9* 9.1  GFRNONAA >60 31*   < > 54* >60 >60 >60  PROT 6.9 6.3*  --   --   --   --  5.0*  ALBUMIN 3.9 3.4*  --  2.9* 2.8*  --  2.5*  AST 15 28  --   --   --   --  27  ALT 10 23  --   --   --   --  21  ALKPHOS 86 69  --   --   --   --  53  BILITOT 0.3 0.6  --   --   --   --  0.5   < > = values in this interval not displayed.    Studies Reviewed:  DG HIP UNILAT WITH PELVIS 2-3 VIEWS LEFT Result Date: 06/05/2023 CLINICAL DATA:  Left hip pain, metastatic breast cancer with skeletal involvement EXAM: DG HIP (WITH OR WITHOUT PELVIS) 2-3V LEFT COMPARISON:  CT pelvis 05/16/2023 FINDINGS: Stable sclerotic lesion in the left greater trochanter. This likely represents a treated metastatic lesion. No fracture the left proximal femur is identified. However, there is ill-defined abnormal lucency scattered throughout the bony pelvis most confluent in the left iliac bone and left sacrum compatible with  lytic metastatic lesions and better shown on the CT from 05/16/2023. Pathologic pelvic fracture difficult to exclude given the heavy burden of osseous metastatic disease. There is lytic involvement in the right (contralateral) femoral head, femoral neck, and proximal metaphysis. IMPRESSION: 1. Heavy burden of lytic osseous metastatic disease in the bony pelvis and right (contralateral) femur. Pathologic pelvic fracture difficult to exclude given the heavy burden of osseous metastatic disease. 2. Stable sclerotic lesion in the left greater trochanter, likely a treated metastatic lesion. 3. No fracture of the left proximal femur is identified. Electronically Signed   By: Ryan Salvage M.D.   On: 06/05/2023 18:11   CT Biopsy Result Date: 06/03/2023  INDICATION: biopsy of hypermetabolic bone lesion to confirm bone metastasis EXAM: CT GUIDED BONE LESION BIOPSY MEDICATIONS: None. ANESTHESIA/SEDATION: Moderate (conscious) sedation was employed during this procedure. A total of Versed  1.5 mg and Fentanyl  100 mcg was administered intravenously. Moderate Sedation Time: 20 minutes. The patient's level of consciousness and vital signs were monitored continuously by radiology nursing throughout the procedure under my direct supervision. FLUOROSCOPY TIME:  CT dose; 115 mGycm COMPLICATIONS: None immediate. Estimated blood loss: <5 mL PROCEDURE: RADIATION DOSE REDUCTION: This exam was performed according to the departmental dose-optimization program which includes automated exposure control, adjustment of the mA and/or kV according to patient size and/or use of iterative reconstruction technique. Informed written consent was obtained from the the patient and/or patient's representative after a thorough discussion of the procedural risks, benefits and alternatives. All questions were addressed. Maximal Sterile Barrier Technique was utilized including caps, mask, sterile gowns, sterile gloves, sterile drape, hand hygiene and  skin antiseptic. A timeout was performed prior to the initiation of the procedure. The patient was positioned prone and non-contrast localization CT was performed of the pelvis to demonstrate the LEFT iliac bone lesion. Maximal barrier sterile technique utilized including caps, mask, sterile gowns, sterile gloves, large sterile drape, hand hygiene, and chlorhexidine  prep. Under sterile conditions and local anesthesia, an 11 gauge coaxial bone biopsy needle was advanced into the LEFT iliac bone lesion. Needle position was confirmed with CT imaging. Initially, bone lesion aspiration was performed. Next, the 11 gauge outer cannula was utilized to obtain a LEFT iliac bone core biopsy. Needle was removed. Hemostasis was obtained with compression. The patient tolerated the procedure well. Samples were prepared with the cytotechnologist. IMPRESSION: Successful CT-guided LEFT iliac bone lesion biopsy. Thom Hall, MD [facility] Electronically Signed   By: Thom Hall M.D.   On: 06/03/2023 16:36   CT CHEST ABDOMEN PELVIS W CONTRAST Result Date: 05/21/2023 CLINICAL DATA:  Breast cancer staging, prior to preoperative systemic therapy * Tracking Code: BO * EXAM: CT CHEST, ABDOMEN, AND PELVIS WITH CONTRAST TECHNIQUE: Multidetector CT imaging of the chest, abdomen and pelvis was performed following the standard protocol during bolus administration of intravenous contrast. RADIATION DOSE REDUCTION: This exam was performed according to the departmental dose-optimization program which includes automated exposure control, adjustment of the mA and/or kV according to patient size and/or use of iterative reconstruction technique. CONTRAST:  OMNIPAQUE  IOHEXOL  300 MG/ML  SOLN COMPARISON:  None Available. FINDINGS: CT CHEST FINDINGS Cardiovascular: Aortic atherosclerosis. Normal heart size. Left and right coronary artery calcifications. No pericardial effusion.  Mediastinum/Nodes: Enlarged supraclavicular and lower cervical lymph nodes measuring up to 1.1 x 0.9 cm (series 2, image 8). Small prominent left axillary lymph nodes measuring up to 1.0 x 0.7 cm (series 2, image 21). Enlarged subcarinal and left hilar lymph nodes measuring up to 0.7 x 1.6 cm (series 2, image 32). Thyroid gland, trachea, and esophagus demonstrate no significant findings. Lungs/Pleura: Mild centrilobular and paraseptal emphysema. Diffuse bilateral bronchial wall thickening. Infrahilar nodule of the left lower lobe, measuring 1.9 x 1.1 cm (series 4, image 94). Clustered nodularity distally in the left lower lobe, likely postobstructive airspace disease. Subpleural radiation fibrosis of the anterior right lung (series 4, image 70). No pleural effusion or pneumothorax. Musculoskeletal: Status post right mastectomy and axillary lymph node dissection. Status post left lumpectomy and axillary lymph node dissection with extensive overlying skin thickening of the left breast. No acute osseous findings. CT ABDOMEN PELVIS FINDINGS Hepatobiliary: No solid  liver abnormality is seen. Simple benign left liver cyst. No gallstones, gallbladder wall thickening, or biliary dilatation. Pancreas: Unremarkable. No pancreatic ductal dilatation or surrounding inflammatory changes. Spleen: Normal in size without significant abnormality. Adrenals/Urinary Tract: Nodularity of the bilateral adrenal glands, likely reflecting metastases, largest on the left measuring 1.5 x 1.2 cm (series 2, image 61). Kidneys are normal, without renal calculi, solid lesion, or hydronephrosis. Bladder is unremarkable. Stomach/Bowel: Stomach is within normal limits. Appendix not clearly visualized. No evidence of bowel wall thickening, distention, or inflammatory changes. Vascular/Lymphatic: Aortic atherosclerosis. No enlarged abdominal or pelvic lymph nodes. Reproductive: No mass or other abnormality. Other: No abdominal wall hernia or  abnormality. No ascites. Musculoskeletal: No acute osseous findings. Widespread lytic osseous metastatic disease throughout the included axial and proximal appendicular skeleton. Several lesions have significant soft tissue components, particularly notable for a large lesion of the right aspect of L1 with significant epidural intrusion measuring 5.0 x 4.0 cm (series 2, image 60). Osseous metastatic disease also notable for large lytic lesions involving the left hemipelvis and right femoral head and neck (series 2, image 96, 110, 117). IMPRESSION: 1. Status post right mastectomy and axillary lymph node dissection. Status post left lumpectomy and axillary lymph node dissection with extensive overlying skin thickening of the left breast. Small prominent left axillary lymph nodes, likely nodal metastases. 2. Widespread lytic osseous metastatic disease throughout the included axial and proximal appendicular skeleton. 3. Several lesions have significant soft tissue components, particularly notable for a large lesion of the right aspect of L1 with significant epidural intrusion, measuring 5.0 x 4.0 cm. 4. Osseous metastatic disease also notable for large lytic lesions involving the left hemipelvis and right femoral head and neck, possibly at risk for pathologic fracture. 5. Enlarged supraclavicular and lower cervical lymph nodes, as well as enlarged subcarinal left hilar lymph nodes and a left lower lobe pulmonary nodule consistent with metastatic disease. 6. Nodularity of the bilateral adrenal glands, likely reflecting metastases. 7. Emphysema. 8. Coronary artery disease. These results will be called to the ordering clinician or representative by the Radiologist Assistant, and communication documented in the PACS or [employer]. Aortic Atherosclerosis (ICD10-I70.0) and Emphysema (ICD10-J43.9). Electronically Signed   By: Marolyn JONETTA Jaksch M.D.   On: 05/21/2023 14:51   Addendum I have seen the patient, examined her. I  agree with the assessment and and plan and have edited the notes.   Pt with recently diagnosed metastatic breast cancer to bones, was admitted last week for worsening hip pain and nausea.  I reviewed her lab and recent scan images, and discussed the reason the bone biopsy with her, she confirmed metastatic breast cancer, ER positive.  Pending PR and HER2.  Patient has been evaluated by orthopedic surgeon Dr. Burnetta today, and may need prophylactic IM nailing.  She has 2 more radiation treatments to be completed by next Monday.  I will change her tamoxifen  to letrozole , additional therapy such as CDk4/6 inhibitor will be initiated after her discharge.  She has received Zometa  for her hypercalcemia, and plan to continue every 3 months for her diffuse bone metastasis.  She has developed a new pancytopenia, probably related to her recent radiation and diffuse bone metastasis.  Will continue monitoring it for now. I will f/u her in hospital as needed, please do not hesitate to call if anything I can help.  [name] MD 06/12/2023

## 2023-06-13 ENCOUNTER — Inpatient Hospital Stay: Payer: 59 | Admitting: Nurse Practitioner

## 2023-06-13 ENCOUNTER — Ambulatory Visit
Admission: RE | Admit: 2023-06-13 | Discharge: 2023-06-13 | Disposition: A | Discharge status: Home / Self Care | Payer: 59 | Source: Ambulatory Visit | Attending: Radiation Oncology | Admitting: Radiation Oncology

## 2023-06-13 ENCOUNTER — Telehealth: Payer: Self-pay

## 2023-06-13 ENCOUNTER — Ambulatory Visit
Admission: RE | Admit: 2023-06-13 | Discharge: 2023-06-13 | Disposition: A | Discharge status: Home / Self Care | Payer: 59 | Source: Ambulatory Visit | Attending: Radiation Oncology

## 2023-06-13 ENCOUNTER — Inpatient Hospital Stay (HOSPITAL_COMMUNITY): Payer: 59

## 2023-06-13 ENCOUNTER — Other Ambulatory Visit: Payer: Self-pay

## 2023-06-13 DIAGNOSIS — Z170 Estrogen receptor positive status [ER+]: Secondary | ICD-10-CM | POA: Diagnosis not present

## 2023-06-13 DIAGNOSIS — C7951 Secondary malignant neoplasm of bone: Secondary | ICD-10-CM | POA: Diagnosis not present

## 2023-06-13 DIAGNOSIS — C50811 Malignant neoplasm of overlapping sites of right female breast: Secondary | ICD-10-CM | POA: Diagnosis not present

## 2023-06-13 DIAGNOSIS — M25552 Pain in left hip: Secondary | ICD-10-CM | POA: Diagnosis not present

## 2023-06-13 LAB — RAD ONC ARIA SESSION SUMMARY
Course Elapsed Days: 15
Plan Fractions Treated to Date: 9
Plan Fractions Treated to Date: 9
Plan Fractions Treated to Date: 9
Plan Prescribed Dose Per Fraction: 3 Gy
Plan Prescribed Dose Per Fraction: 3 Gy
Plan Prescribed Dose Per Fraction: 3 Gy
Plan Total Fractions Prescribed: 10
Plan Total Fractions Prescribed: 10
Plan Total Fractions Prescribed: 10
Plan Total Prescribed Dose: 30 Gy
Plan Total Prescribed Dose: 30 Gy
Plan Total Prescribed Dose: 30 Gy
Reference Point Dosage Given to Date: 27 Gy
Reference Point Dosage Given to Date: 27 Gy
Reference Point Dosage Given to Date: 27 Gy
Reference Point Session Dosage Given: 3 Gy
Reference Point Session Dosage Given: 3 Gy
Reference Point Session Dosage Given: 3 Gy
Session Number: 9

## 2023-06-13 NOTE — Consult Note (Signed)
 Consult request received for pelvic and femur lesions with associated left acetabular fracture. Have discussed with the requesting MD, Dr. Donaciano Sprang, the patient's stability, pain control, and ongoing work up. I have reviewed x-rays, ordered CT scan, and developed a provisional plan.   Full consultation to follow. Patient will require complex care at a tertiary facility from a musculoskeletal oncologist. I have reached out to Dr. Soyla Ramp at [facility] and forward some films but have not heard back yet.  Ozell Bruch, MD [facility], [facility] [phone]

## 2023-06-13 NOTE — Plan of Care (Signed)
  Problem: Pain Management: Goal: General experience of comfort will improve Outcome: Progressing   Problem: Safety: Goal: Ability to remain free from injury will improve Outcome: Progressing   Problem: Skin Integrity: Goal: Risk for impaired skin integrity will decrease Outcome: Progressing

## 2023-06-13 NOTE — Progress Notes (Signed)
    Subjective:    Patient reports pain as 6 on 0-10 scale and severe.  She continues to primarily complain of left hip/groin pain with motion.  Denies CP or SOB.  Voiding without difficulty. Positive flatus. Objective: Vital signs in last 24 hours: Temp:  [97.8 F (36.6 C)-98.6 F (37 C)] 97.8 F (36.6 C) (01/03 0506) Pulse Rate:  [87-91] 90 (01/03 0506) Resp:  [16] 16 (01/03 0506) BP: (97-133)/(63-79) 133/79 (01/03 0506) SpO2:  [93 %-95 %] 93 % (01/03 0506)  Intake/Output from previous day: 01/02 0701 - 01/03 0700 In: 120 [P.O.:120] Out: 600 [Urine:600] Intake/Output this shift: No intake/output data recorded.  Labs: Recent Labs    06/11/23 0721  HGB 11.2*   Recent Labs    06/11/23 0721  WBC 2.6*  RBC 3.56*  HCT 33.6*  PLT 63*   Recent Labs    06/11/23 0721  NA 135  K 3.7  CL 103  CO2 26  BUN 13  CREATININE 0.83  GLUCOSE 92  CALCIUM 9.1   No results for input(s): LABPT, INR in the last 72 hours.  Physical Exam: Neurologically intact Intact pulses distally Dorsiflexion/Plantar flexion intact No cellulitis present Compartment soft Body mass index is 23.27 kg/m.   Assessment/Plan:   Lumbar MRI was completed. Extensive osseous metastatic disease throughout the thoracic, lumbar and sacrum. This includes bulky extraosseous extension of tumor with obliteration of the right L1-L2 foramen and involvement of the canal at multiple levels, detailed above. No high-grade canal stenosis.  Fortunately patient is neurologically intact despite the moderate stenosis and epidural mass that extends into the right paraspinal region and pedicle.  At this point time I do not think surgical decompression and stabilization is necessary.  I will defer the need for radiation treatment for the spinal met to the oncology team.  Awaiting CT scan of the left hip to evaluate for a pathological left acetabular fracture.  This is more than likely the source of her significant  left groin pain.  I have discussed the patient's care with the orthopedic traumatologist and they are awaiting the CT scan to determine the best course of action.  The patient also noted significant gross hematuria.  Will make sure the hospitalist team is aware so this can be addressed.  Donaciano JONETTA Sprang [facility] [phone] 06/13/2023, 9:56 AM

## 2023-06-13 NOTE — Progress Notes (Signed)
  Progress Note   Patient: Darlene Beasley FMW:[id number] DOB: April 20, 1956 DOA: 06/05/2023     7 DOS: the patient was seen and examined on 06/13/2023   Brief hospital course: 68 year old woman PMH breast cancer status post left lumpectomy, right mastectomy, chemotherapy, radiation, antiestrogen therapy, with newly diagnosed diffuse bone metastatic disease in November who presented with left hip pain and nausea and vomiting.  Previously quite active but since November pain progressed to the point where she was mostly wheelchair-bound.  Receiving radiation therapy for pain.  Presented to the emergency department 12/26 from radiation oncology.  Admitted for acute on chronic hip pain secondary to malignancy and metastatic disease  Consultants Oncology  Palliative [facility]   Procedures/Events   Assessment and Plan: Metastatic breast cancer with lytic lesions of her pelvis, spine and femur.   Concern for impending pathologic fracture right proximal femur Concern for left acetabular fracture causing left hip pain Soft tissue mass L1 Left hip pain Extensive osseous metastatic disease throughout the thoracic, lumbar and sacrum including bulky extraosseous extension of tumor with obliteration of right L1-L2 foramen involvement of the canal at multiple levels.  No high-grade canal stenosis. Hypercalcemia of malignancy treated with Zometa  12/29 and now resolved. Continue pain control.  Appreciate palliative medicine care. Orthopedics now recommending potential transfer to [facility].  Await further feedback from orthopedics.   Nausea and vomiting Adult failure to thrive   Pancytopenia Probably related to recent radiation diffuse bony metastasis.  Monitor.   Essential hypertension Blood pressure stable on amlodipine .   Acute kidney injury Secondary to failure to thrive  Resolved.   Hyponatremia Resolved.   Reported gross hematuria Monitor for recurrence.  Anticoagulation stopped for  now.    Subjective:  Feels better today.  Less pain.  Physical Exam: Vitals:   06/12/23 1349 06/12/23 2102 06/13/23 0506 06/13/23 1330  BP: 97/63 108/68 133/79 102/74  Pulse: 91 87 90 89  Resp: 16 16 16 16   Temp: 98.5 F (36.9 C) 98.6 F (37 C) 97.8 F (36.6 C) 97.8 F (36.6 C)  TempSrc: Oral Oral Oral Oral  SpO2: 95% 94% 93% 93%  Weight:      Height:       Physical Exam Vitals reviewed.  Constitutional:      General: She is not in acute distress.    Appearance: She is not ill-appearing or toxic-appearing.  Cardiovascular:     Rate and Rhythm: Normal rate and regular rhythm.     Heart sounds: No murmur heard. Pulmonary:     Effort: Pulmonary effort is normal. No respiratory distress.     Breath sounds: No wheezing, rhonchi or rales.  Neurological:     Mental Status: She is alert.  Psychiatric:        Mood and Affect: Mood normal.        Behavior: Behavior normal.     Data Reviewed:  CTs, MRI spine, bone survey noted.  Family Communication: partner at bedside  Disposition: Status is: Inpatient Remains inpatient appropriate because: severe pain, need for orthopedic intervention     Time spent: 25 minutes  Author: Toribio Door, MD 06/13/2023 5:34 PM  For on call review [email or web address].

## 2023-06-13 NOTE — Telephone Encounter (Addendum)
 I sent an email to Jonita Albee in pathology to have the panel added.   ----- Message from Malachy Mood sent at 06/12/2023  7:43 AM EST ----- Please call path to see if they can do prognostic panel (ER, PR, HER2) on her biopsy, thx   Malachy Mood

## 2023-06-14 ENCOUNTER — Encounter (HOSPITAL_COMMUNITY): Payer: Self-pay | Admitting: Family Medicine

## 2023-06-14 DIAGNOSIS — M84454A Pathological fracture, pelvis, initial encounter for fracture: Secondary | ICD-10-CM

## 2023-06-14 HISTORY — DX: Pathological fracture, pelvis, initial encounter for fracture: M84.454A

## 2023-06-14 MED ORDER — POLYETHYLENE GLYCOL 3350 17 G PO PACK
17.0000 g | PACK | Freq: Two times a day (BID) | ORAL | Status: DC
  Administered 2023-06-14 – 2023-07-02 (×35): 17 g via ORAL
  Filled 2023-06-14 (×37): qty 1

## 2023-06-14 NOTE — Consult Note (Signed)
 Orthopaedic Trauma Service (OTS) Consultation   Patient ID: NANDA BITTICK MRN: [id number] DOB/AGE: 1956/06/08 68 y.o.   Reason for Consult: Metastatic cancer with pathologic left pelvic fracture Referring Physician: Donaciano Sprang, MD  HPI: Darlene Beasley is an 68 y.o. female with metastatic breast CA. Has been on walker for one month, wheelchair for 2 weeks, with significant other providing lots of help with transfers. Pain became so great that she chose to come into the hospital where films have demonstrated extensive involvement of her pelvis, femur, and other regions of the musculoskeletal system including skull. Denies current numbness or tingling. Severe stabbing pain with movement. Reports difficulty with stool, primarily liquid in nature but she is concerned may be a bolus. No good BM for a month. Has had visual hallucinations but no auditory ones. Currently not experiencing that and feels like radiation has improved her pain. I coordinated care and  evaluation with Dr. Sprang.  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)   Personal history of chemotherapy    Personal history of radiation therapy     Past Surgical History:  Procedure Laterality Date   BREAST LUMPECTOMY Left 04/03/2018   BREAST LUMPECTOMY WITH RADIOACTIVE SEED AND SENTINEL LYMPH NODE BIOPSY Left 04/03/2018   Procedure: LEFT BREAST LUMPECTOMY WITH RADIOACTIVE SEED AND LEFT SENTINEL LYMPH NODE BIOPSY;  Surgeon: Gail Favorite, MD;  Location: [facility];  Service: General;  Laterality: Left;   IR IMAGING GUIDED PORT INSERTION  10/20/2017   IR US  GUIDE VASC ACCESS LEFT  10/20/2017   MASTECTOMY  Right 04/03/2018   MASTECTOMY MODIFIED RADICAL Right 04/03/2018   Procedure: MASTECTOMY MODIFIED RADICAL;  Surgeon: Gail Favorite, MD;  Location: [facility];  Service: General;  Laterality: Right;   PORT-A-CATH REMOVAL N/A 04/03/2018   Procedure: REMOVAL PORT-A-CATH;  Surgeon: Gail Favorite, MD;  Location: [facility];  Service: General;  Laterality: N/A;   TONSILLECTOMY Bilateral    TUBAL LIGATION      Family History  Adopted: Yes  Problem Relation Age of Onset   Congestive Heart Failure Mother    Congestive Heart Failure Father     Social History:  reports that she has been smoking cigarettes. She has a 25.5 pack-year smoking history. She has never used smokeless tobacco. She reports current alcohol use of about 1.0 standard drink of alcohol per week. She reports current drug use. Drug: Marijuana.  Allergies:  Allergies  Allergen Reactions   Bee Venom Anaphylaxis and Swelling   Tramadol Swelling and Other (See Comments)    Throat swelling    Medications: Scheduled:  amLODipine   5 mg Oral Daily   cyclobenzaprine   5 mg Oral TID   dexamethasone  (DECADRON ) injection  10 mg Intravenous Q24H   feeding supplement  237 mL Oral BID BM   ketorolac   15 mg Intravenous Q6H   letrozole   2.5 mg Oral Daily   oxyCODONE   15 mg Oral Q12H   pantoprazole   40 mg Oral Daily   polyethylene glycol  17 g Oral BID   senna-docusate  1 tablet Oral BID    No results found for  this or any previous visit (from the past 48 hours).  DG Bone Survey Met Result Date: 06/13/2023 CLINICAL DATA:  Metastasis of unknown primary. Diffuse skeletal metastases. EXAM: METASTATIC BONE SURVEY COMPARISON:  MRI lumbar spine 06/12/2023, CT chest, abdomen, and pelvis 05/16/2023; whole-body bone scan 05/07/2023 FINDINGS: Innumerable lytic lesions are again seen throughout the axial and appendicular skeleton. There are multiple lytic lesions within the skull, measuring up to 12 mm. 11 mm lytic  lesion within the lateral left scapula just inferior to the glenoid. 28 mm lytic lesion within the lateral right scapula inferior to the glenoid. Dominant approximate 14 mm lytic lesion within the proximal lateral aspect of the right humeral diaphysis. 13 mm lytic lesion within the proximal left humeral metadiaphysis and 13 mm lytic lesion within the mid left humeral diaphysis. Moderate anterior height loss of the C7 vertebral body. Lucency predominantly within the posterosuperior aspect of the C7 vertebral body, likely pathologic involvement resulting in pathologic compression fracture. Numerous lytic lesions throughout the lumbar spine, better seen on prior CT and MRI. Dominant right L1 transverse process destructive lesion causing mild scalloping of the right L1 vertebral body, better seen on prior CT where a dumbbell-shaped lesion extends into the right aspect of the central canal and through the right neural foramen on prior CT. Grade 1 anterolisthesis of L3 on L4 and L4 on L5. The known marked left-greater-than-right hemipelvis destructive lytic lesions are better seen on today's CT. Bowel-gas obscures portions of the bilateral iliac bones. Sclerotic lesion within the left intertrochanteric region extending to the left greater trochanter measuring up to approximately 3.6 cm in craniocaudal dimension. Adjacent slightly more inferolateral proximal left femoral 14 mm lucent lesion. Additional scattered small lucent lesions within the bilateral femoral diaphyses and right lateral tibial plateau. Bilateral axillary surgical clips. IMPRESSION: 1. Innumerable lytic lesions throughout the axial and appendicular skeleton, consistent with diffuse osseous metastatic disease. 2. Moderate anterior height loss of the C7 vertebral body, likely a pathologic compression fracture. 3. Dominant right L1 transverse process destructive lesion causing mild scalloping of the right L1 vertebral body, better seen on prior CT where a  dumbbell-shaped lesion extends into the right aspect of the central canal and through the right neural foramen on prior CT. 4. Dominant left greater than right hemipelvis lytic lesions are better seen on today's CT. 5. Sclerotic lesion within the left intertrochanteric region extending to the left greater trochanter measuring up to approximately 3.6 cm in craniocaudal dimension. Adjacent slightly more inferolateral proximal left femoral 14 mm lucent lesion. Electronically Signed   By: Tanda Lyons M.D.   On: 06/13/2023 13:53   CT HIP LEFT WO CONTRAST Result Date: 06/13/2023 CLINICAL DATA:  Metastatic breast cancer disease evaluation. Left hip pain. Musculoskeletal neoplasm. Assess treatment response. EXAM: CT OF THE LEFT HIP WITHOUT CONTRAST TECHNIQUE: Multidetector CT imaging of the left hip was performed according to the standard protocol. Multiplanar CT image reconstructions were also generated. RADIATION DOSE REDUCTION: This exam was performed according to the departmental dose-optimization program which includes automated exposure control, adjustment of the mA and/or kV according to patient size and/or use of iterative reconstruction technique. Multidetector CT imaging of the left hip was performed following the standard protocol without intravenous contrast. COMPARISON:  None Available. Pelvis and left hip radiographs 06/05/2023; CT right hip 06/12/2023; CT chest, abdomen, and pelvis 05/16/2023 FINDINGS: Bones/Joint/Cartilage There is diffuse decreased bone mineralization. There are again diffuse predominantly lytic skeletal metastases seen throughout the visualized lower lumbar spine, sacrum, ilium,  ischium, pubis portions of the pelvis and right greater than left proximal femurs. Right hip: The right hip was recently imaged on 06/12/2023 CT. Dominant superior right acetabular lytic lesion measuring approximately 2.9 x 2.8 x 4.6 cm (transverse by AP by craniocaudal) is unchanged. There is again mild  cortical step-off and a likely tiny pathologic fracture of the superomedial aspect of the right acetabulum at the inferior aspect of this lytic lesion (coronal series 6, image 69). This also again erodes through the medial right acetabular cortex. Dominant lesion within the right proximal femoral lesser trochanter measuring up to 3.7 cm (coronal series 6, image 75) is unchanged. Two dominant right femoral head lytic lesions with the larger, anterior lesion measuring up to 3.3 cm in craniocaudal dimension (coronal series 6, image 68) and marked thinning the inferior femoral head-neck junction with punctate complete erosion of the cortex (coronal series 6, image 66), unchanged from prior. Additional scattered lucent lesions throughout the right hemipelvis. Left hemipelvis: There are again innumerable lytic lesions within the left hemisacrum and left ilium, overall mildly to moderately progressed from 05/16/2023 CT. There are multiple worsened areas of cortical erosion, including within the superior and inferior aspects of the left hemisacrum, lateral left ilium, and anterior and lateral miss mid left ilium (axial series 2, image 46) where there is a new pathologic fracture with up to 4 mm posterior displacement of the lateral ileum with respect of the medial ilium. Additional more distal lateral left iliac wing new acute pathologic fracture with 3 mm posterior displacement of the lateral component (axial series 2, image 50). Left hip: A spiculated predominantly sclerotic lesion within the left femoral intertrochanteric region extending into the greater trochanter measures up to approximately 3.3 cm in craniocaudal dimension and is very similar to 05/16/2023. Adjacent lytic lesion within the inferior left femoral intertrochanteric region measuring up to 19 mm in craniocaudal dimension moderately thinning the lateral left cortex (axial series 2, image 104 and coronal series 6, image 72) is mildly worsened from prior.  There is mildly worsened erosion of the inferior medial left ilial cortex just superior to the left acetabulum (axial series 2, image 58 and coronal series 6, image 69). L4 vertebral body lytic lesion measuring up to 2.5 cm in craniocaudal dimension is similar to 05/16/2023 CT. Ligaments Suboptimally assessed by CT. Muscles and Tendons Minimal atrophy and fatty infiltration of the left greater than right gluteus minimus and medius muscles. Soft tissues Mild-to-moderate left and mild right superior left hip subcutaneous fat edema and swelling. No free fluid is seen within the pelvis. The uterus is present. Moderate to high-grade atherosclerotic calcifications. IMPRESSION: Compared to 05/16/2023: 1. Diffuse predominantly lytic skeletal metastases throughout the visualized lower lumbar spine, left-greater-than-right pelvis, and proximal femurs. 2. There are new, acute, mildly displaced pathologic fractures of the mid left ilium and distal lateral left iliac crest, as described above. 3. Dominant superior right acetabular lytic lesion with mild cortical step-off and likely tiny pathologic fracture of the superomedial aspect of the right acetabulum is unchanged from recent 06/12/2023 CT of the right hip. 4. Dominant right femoral head lytic lesions with marked thinning the inferior femoral head-neck junction. 5. Dominant spiculated predominantly sclerotic lesion within the left femoral intertrochanteric region extending into the greater trochanter is very similar to 05/16/2023. Adjacent lytic lesion within the inferior left femoral intertrochanteric region measuring up to 19 mm in craniocaudal dimension moderately thinning the lateral left cortex is mildly worsened from prior. Electronically Signed   By: Tanda  Viola M.D.   On: 06/13/2023 13:36   CT PELVIS WO CONTRAST Result Date: 06/13/2023 CLINICAL DATA:  Metastatic breast cancer disease evaluation. Left hip pain. Musculoskeletal neoplasm. Assess treatment response.  EXAM: CT OF THE LEFT HIP WITHOUT CONTRAST TECHNIQUE: Multidetector CT imaging of the left hip was performed according to the standard protocol. Multiplanar CT image reconstructions were also generated. RADIATION DOSE REDUCTION: This exam was performed according to the departmental dose-optimization program which includes automated exposure control, adjustment of the mA and/or kV according to patient size and/or use of iterative reconstruction technique. Multidetector CT imaging of the left hip was performed following the standard protocol without intravenous contrast. COMPARISON:  None Available. Pelvis and left hip radiographs 06/05/2023; CT right hip 06/12/2023; CT chest, abdomen, and pelvis 05/16/2023 FINDINGS: Bones/Joint/Cartilage There is diffuse decreased bone mineralization. There are again diffuse predominantly lytic skeletal metastases seen throughout the visualized lower lumbar spine, sacrum, ilium, ischium, pubis portions of the pelvis and right greater than left proximal femurs. Right hip: The right hip was recently imaged on 06/12/2023 CT. Dominant superior right acetabular lytic lesion measuring approximately 2.9 x 2.8 x 4.6 cm (transverse by AP by craniocaudal) is unchanged. There is again mild cortical step-off and a likely tiny pathologic fracture of the superomedial aspect of the right acetabulum at the inferior aspect of this lytic lesion (coronal series 6, image 69). This also again erodes through the medial right acetabular cortex. Dominant lesion within the right proximal femoral lesser trochanter measuring up to 3.7 cm (coronal series 6, image 75) is unchanged. Two dominant right femoral head lytic lesions with the larger, anterior lesion measuring up to 3.3 cm in craniocaudal dimension (coronal series 6, image 68) and marked thinning the inferior femoral head-neck junction with punctate complete erosion of the cortex (coronal series 6, image 66), unchanged from prior. Additional scattered  lucent lesions throughout the right hemipelvis. Left hemipelvis: There are again innumerable lytic lesions within the left hemisacrum and left ilium, overall mildly to moderately progressed from 05/16/2023 CT. There are multiple worsened areas of cortical erosion, including within the superior and inferior aspects of the left hemisacrum, lateral left ilium, and anterior and lateral miss mid left ilium (axial series 2, image 46) where there is a new pathologic fracture with up to 4 mm posterior displacement of the lateral ileum with respect of the medial ilium. Additional more distal lateral left iliac wing new acute pathologic fracture with 3 mm posterior displacement of the lateral component (axial series 2, image 50). Left hip: A spiculated predominantly sclerotic lesion within the left femoral intertrochanteric region extending into the greater trochanter measures up to approximately 3.3 cm in craniocaudal dimension and is very similar to 05/16/2023. Adjacent lytic lesion within the inferior left femoral intertrochanteric region measuring up to 19 mm in craniocaudal dimension moderately thinning the lateral left cortex (axial series 2, image 104 and coronal series 6, image 72) is mildly worsened from prior. There is mildly worsened erosion of the inferior medial left ilial cortex just superior to the left acetabulum (axial series 2, image 58 and coronal series 6, image 69). L4 vertebral body lytic lesion measuring up to 2.5 cm in craniocaudal dimension is similar to 05/16/2023 CT. Ligaments Suboptimally assessed by CT. Muscles and Tendons Minimal atrophy and fatty infiltration of the left greater than right gluteus minimus and medius muscles. Soft tissues Mild-to-moderate left and mild right superior left hip subcutaneous fat edema and swelling. No free fluid is seen within the pelvis. The uterus  is present. Moderate to high-grade atherosclerotic calcifications. IMPRESSION: Compared to 05/16/2023: 1. Diffuse  predominantly lytic skeletal metastases throughout the visualized lower lumbar spine, left-greater-than-right pelvis, and proximal femurs. 2. There are new, acute, mildly displaced pathologic fractures of the mid left ilium and distal lateral left iliac crest, as described above. 3. Dominant superior right acetabular lytic lesion with mild cortical step-off and likely tiny pathologic fracture of the superomedial aspect of the right acetabulum is unchanged from recent 06/12/2023 CT of the right hip. 4. Dominant right femoral head lytic lesions with marked thinning the inferior femoral head-neck junction. 5. Dominant spiculated predominantly sclerotic lesion within the left femoral intertrochanteric region extending into the greater trochanter is very similar to 05/16/2023. Adjacent lytic lesion within the inferior left femoral intertrochanteric region measuring up to 19 mm in craniocaudal dimension moderately thinning the lateral left cortex is mildly worsened from prior. Electronically Signed   By: Tanda Lyons M.D.   On: 06/13/2023 13:36   MR Lumbar Spine W Wo Contrast Result Date: 06/13/2023 CLINICAL DATA:  Spine metastases, lumbar, monitor EXAM: MRI LUMBAR SPINE WITHOUT AND WITH CONTRAST TECHNIQUE: Multiplanar and multiecho pulse sequences of the lumbar spine were obtained without and with intravenous contrast. CONTRAST:  6mL GADAVIST  GADOBUTROL  1 MMOL/ML IV SOLN COMPARISON:  None Available. FINDINGS: Segmentation:  Standard segmentation is assumed. Alignment:  No substantial sagittal subluxation. Vertebrae: Extensive osseous metastatic disease throughout the visualized thoracic, lumbar and sacrum. This includes lesions at every level and lesions in the vertebral bodies and posterior elements. There is extraosseous extension of tumor including a large (5.2 x 3.7 cm) soft tissue mass which obliterates the right L1-L2 foramen and invades the right lateral canal at the L1 level. Resulting mild canal stenosis with  leftward displacement of the cauda equina nerve roots. Additional index soft tissue mass involves the spinous process at L2 (2.2 x 1.9 cm on series 12, image 18). Additional mass involving the spinous process and lamina at L5 with extension into the posterior canal. This abuts the cauda equina nerve roots without significant canal stenosis. A lesion involving the left pedicle at L5-S1 encroaches on the foramen. Conus medullaris and cauda equina: Conus extends to the L1 level. Conus appears normal. Paraspinal and other soft tissues: No evidence of acute abnormality. Disc levels: Extensive bony and extraosseous metastatic lesions are detailed above. Superimposed degenerative change with mild to moderate canal stenosis at L3-L4 and moderate foraminal stenosis at L4-L5. IMPRESSION: Extensive osseous metastatic disease throughout the thoracic, lumbar and sacrum. This includes bulky extraosseous extension of tumor with obliteration of the right L1-L2 foramen and involvement of the canal at multiple levels, detailed above. No high-grade canal stenosis. Electronically Signed   By: Gilmore GORMAN Molt M.D.   On: 06/13/2023 03:09   CT HIP RIGHT WO CONTRAST Result Date: 06/12/2023 CLINICAL DATA:  Metastatic breast cancer. Right hip pain. Surgical planning. EXAM: CT OF THE RIGHT HIP WITHOUT CONTRAST TECHNIQUE: Multidetector CT imaging of the right hip was performed according to the standard protocol. Multiplanar CT image reconstructions were also generated. RADIATION DOSE REDUCTION: This exam was performed according to the departmental dose-optimization program which includes automated exposure control, adjustment of the mA and/or kV according to patient size and/or use of iterative reconstruction technique. COMPARISON:  Radiographs 06/05/2023, CT 05/16/2023 and bone scan 05/07/2023. FINDINGS: Bones/Joint/Cartilage Again demonstrated are widespread lytic metastases throughout the visualized right hemipelvis and proximal right  femur. Overall mild progression is seen compared with the CT 4 weeks earlier. For example, a  lesion in the right superior acetabulum measures up to 4.6 cm on coronal image 42/8 (previously 4.1 cm) and a lesion of the right femoral lesser trochanter measures up to 3.6 cm on coronal image 46/8 (previously 3.2 cm). There are other prominent lytic lesions within the right femoral head and subtrochanteric region. Incidental imaging of the left pelvis demonstrates progressive disease within the left sacrum and the ilium. No definite pathologic fracture significant hip joint effusion demonstrated at this time. Ligaments Suboptimally assessed by CT. Muscles and Tendons No intramuscular mass, focal atrophy or hematoma is demonstrated. Soft tissues No evidence of pelvic or inguinal adenopathy. Mild nonspecific subcutaneous edema lateral to the right hip. Mild iliofemoral atherosclerosis. IMPRESSION: 1. Mild progression of widespread lytic metastases throughout the visualized right hemipelvis and proximal right femur compared with the CT 4 weeks earlier. No definite pathologic fracture or significant hip joint effusion demonstrated at this time. 2. No evidence of soft tissue mass or adenopathy. Electronically Signed   By: Elsie Perone M.D.   On: 06/12/2023 15:52    Intake/Output      01/03 0701 01/04 0700 01/04 0701 01/05 0700   P.O. 240    Total Intake(mL/kg) 240 (3.9)    Urine (mL/kg/hr) 1900 (1.3)    Total Output 1900    Net -1660         Urine Occurrence 1 x       ROS as above in HPI; weight loss previously but has been stable recently; reports hematuria also  Blood pressure 102/74, pulse 89, temperature 97.8 F (36.6 C), temperature source Oral, resp. rate 16, height 5' 4 (1.626 m), weight 61.5 kg, SpO2 93%. Physical Exam Pleasant, A&O x 4 RUEx shoulder, elbow, wrist, digits- no skin wounds, nontender, no instability, no blocks to motion  Sens  Ax/R/M/U intact  Mot   Ax/ R/ PIN/ M/ AIN/ U  intact  Rad 2+ LUEx shoulder, elbow, wrist, digits- no skin wounds, nontender, no instability, no blocks to motion  Sens  Ax/R/M/U intact  Mot   Ax/ R/ PIN/ M/ AIN/ U intact  Rad 2+ Pelvis tender bilaterally, worse on the left LLE No traumatic wounds, ecchymosis, or rash  Nontender  No knee or ankle effusion  Knee stable to varus/ valgus and anterior/posterior stress  Sens DPN, SPN, TN intact  Motor EHL, ext, flex, evers 5/5  DP +, PT +, mild edema LLE No traumatic wounds, ecchymosis, or rash  Tender mildly  No knee or ankle effusion  Knee stable to varus/ valgus stress  Sens DPN, SPN, TN intact  Motor EHL, ext, flex, evers 5/5  DP +, PT +, mild edema Gait could not observe  Assessment/Plan:  Diffuse metastatic disease breast cancer; path frx left pelvis; significant involvement bilateral femurs Hematuria GI abnormality ?stool bolus Hallucination  I spoke at length with Dr. Soyla Ramp at [facility], and reviewed the xrays and CT scan. He stated that disease was so pervasive that fixation would be difficult to obtain, surgical risks elevated, and results such that careful deliberation regarding whether to proceed with surgery are needed. We had hoped for this as an outpatient at his clinic but patient's pain is such this does not appear achievable. I have asked for a virtual consultation and am waiting to here back. Patient appears to grasp the concerns and options well and is in agreement with the plan and does not want surgery unless reasonable benefit compared to risks and recovery. She believes her significant other  would be helpful in hearing issues and making a decision.  The contact coordinator for Dr. Ballard is Tori Power who can be reached at [email or web address]  I spent 60 minutes of direct face to face time.  Ozell Bruch, MD [facility], [facility] [phone]  06/14/2023, 10:37 AM  [facility] [address] [place]  [place] [address] [phone] (F)    After 5pm and on the weekends please log on to Amion, go to orthopaedics and the look under the [facility] Call for the provider(s) on call. You can also call our office at [phone] and then follow the prompts to be connected to the call team.

## 2023-06-14 NOTE — Progress Notes (Signed)
  Progress Note   Patient: Darlene Beasley FMW:[id number] DOB: [birth year] DOA: 06/05/2023     8 DOS: the patient was seen and examined on 06/14/2023   Brief hospital course: 68 year old woman PMH breast cancer status post left lumpectomy, right mastectomy, chemotherapy, radiation, antiestrogen therapy, with newly diagnosed diffuse bone metastatic disease in November who presented with left hip pain and nausea and vomiting.  Previously quite active but since November pain progressed to the point where she was mostly wheelchair-bound.  Receiving radiation therapy for pain.  Presented to the emergency department 12/26 from radiation oncology.  Admitted for acute on chronic hip pain secondary to malignancy and metastatic disease  Consultants Oncology  Palliative [facility]   Procedures/Events   Assessment and Plan: Metastatic breast cancer with lytic lesions of her pelvis, spine and femur.   Concern for impending pathologic fracture right proximal femur Concern for left acetabular fracture causing left hip pain Soft tissue mass L1 Left hip pain Extensive osseous metastatic disease throughout the thoracic, lumbar and sacrum including bulky extraosseous extension of tumor with obliteration of right L1-L2 foramen involvement of the canal at multiple levels.  No high-grade canal stenosis. Hypercalcemia of malignancy treated with Zometa  12/29 and now resolved. Continue pain control.  Overall stable.  Appreciate palliative medicine care. Orthopedics now recommending potential transfer to [facility].  Await further feedback from orthopedics.   Nausea and vomiting Adult failure to thrive   Pancytopenia Probably related to recent radiation diffuse bony metastasis.  Check CBC in AM.   Essential hypertension Blood pressure stable on amlodipine .   Acute kidney injury Secondary to failure to thrive  Resolved.   Hyponatremia Resolved.   Reported gross hematuria Monitor for recurrence.   Anticoagulation stopped for now.      Subjective:  Feels ok Urine seems to have cleared up  Physical Exam: Vitals:   06/12/23 1349 06/12/23 2102 06/13/23 0506 06/13/23 1330  BP: 97/63 108/68 133/79 102/74  Pulse: 91 87 90 89  Resp: 16 16 16 16   Temp: 98.5 F (36.9 C) 98.6 F (37 C) 97.8 F (36.6 C) 97.8 F (36.6 C)  TempSrc: Oral Oral Oral Oral  SpO2: 95% 94% 93% 93%  Weight:      Height:       Physical Exam Vitals reviewed.  Constitutional:      General: She is not in acute distress.    Appearance: She is not ill-appearing or toxic-appearing.  Cardiovascular:     Rate and Rhythm: Normal rate and regular rhythm.     Heart sounds: No murmur heard. Pulmonary:     Effort: Pulmonary effort is normal. No respiratory distress.     Breath sounds: No wheezing, rhonchi or rales.  Neurological:     Mental Status: She is alert.  Psychiatric:        Mood and Affect: Mood normal.        Behavior: Behavior normal.     Data Reviewed: No new data  Family Communication: none present  Disposition: Status is: Inpatient Remains inpatient appropriate because: needs orthopedic intervention. Currently non-weight bearing     Time spent: 20 minutes  Author: Toribio Door, MD 06/14/2023 10:27 AM  For on call review [email or web address].

## 2023-06-14 NOTE — Plan of Care (Signed)

## 2023-06-15 DIAGNOSIS — M84454A Pathological fracture, pelvis, initial encounter for fracture: Secondary | ICD-10-CM

## 2023-06-15 DIAGNOSIS — C7951 Secondary malignant neoplasm of bone: Secondary | ICD-10-CM | POA: Diagnosis not present

## 2023-06-15 DIAGNOSIS — Z515 Encounter for palliative care: Secondary | ICD-10-CM | POA: Diagnosis not present

## 2023-06-15 DIAGNOSIS — Z7189 Other specified counseling: Secondary | ICD-10-CM | POA: Diagnosis not present

## 2023-06-15 LAB — CBC
HCT: 36.4 % (ref 36.0–46.0)
Hemoglobin: 12.2 g/dL (ref 12.0–15.0)
MCH: 31.5 pg (ref 26.0–34.0)
MCHC: 33.5 g/dL (ref 30.0–36.0)
MCV: 94.1 fL (ref 80.0–100.0)
Platelets: 79 10*3/uL — ABNORMAL LOW (ref 150–400)
RBC: 3.87 MIL/uL (ref 3.87–5.11)
RDW: 13.8 % (ref 11.5–15.5)
WBC: 5.8 10*3/uL (ref 4.0–10.5)
nRBC: 0 % (ref 0.0–0.2)

## 2023-06-15 MED ORDER — BIOTENE DRY MOUTH MT LIQD
15.0000 mL | OROMUCOSAL | Status: DC | PRN
  Administered 2023-06-16: 15 mL via OROMUCOSAL

## 2023-06-15 NOTE — Plan of Care (Signed)

## 2023-06-15 NOTE — Progress Notes (Signed)
  Progress Note   Patient: Darlene Beasley FMW:[id number] DOB: [birth year] DOA: 06/05/2023     9 DOS: the patient was seen and examined on 06/15/2023   Brief hospital course: 68 year old woman PMH breast cancer status post left lumpectomy, right mastectomy, chemotherapy, radiation, antiestrogen therapy, with newly diagnosed diffuse bone metastatic disease in November who presented with left hip pain and nausea and vomiting.  Previously quite active but since November pain progressed to the point where she was mostly wheelchair-bound.  Receiving radiation therapy for pain.  Presented to the emergency department 12/26 from radiation oncology.  Admitted for acute on chronic hip pain secondary to malignancy and metastatic disease  Consultants Oncology  Palliative [facility]   Procedures/Events   Assessment and Plan: Metastatic breast cancer with lytic lesions of her pelvis, spine and femur.   Concern for impending pathologic fracture right proximal femur Concern for left acetabular fracture causing left hip pain Soft tissue mass L1 Left hip pain Extensive osseous metastatic disease throughout the thoracic, lumbar and sacrum including bulky extraosseous extension of tumor with obliteration of right L1-L2 foramen involvement of the canal at multiple levels.  No high-grade canal stenosis. Hypercalcemia of malignancy treated with Zometa  12/29 and now resolved. Continue pain control.  Overall stable.  Appreciate palliative medicine care. Orthopedics recommending potential transfer to [facility].  Await further feedback from orthopedics.   Nausea and vomiting Adult failure to thrive   Pancytopenia Probably related to recent radiation diffuse bony metastasis.  WBC and hemoglobin have recovered.  Platelets up to 79.   Essential hypertension Blood pressure stable on amlodipine .   Acute kidney injury Secondary to failure to thrive  Resolved.   Hyponatremia Resolved.   Reported gross  hematuria Monitor for recurrence.  Anticoagulation stopped for now.      Subjective:  Doing alright   Physical Exam: Vitals:   06/14/23 1357 06/14/23 2235 06/15/23 0634 06/15/23 0824  BP: 120/80 133/74 134/84 130/85  Pulse: 88 91 86   Resp: 18 18 18    Temp: (!) 97.4 F (36.3 C) 98 F (36.7 C) 97.7 F (36.5 C)   TempSrc: Oral Oral Oral   SpO2: 96% 95% 93%   Weight:      Height:       Physical Exam Vitals reviewed.  Constitutional:      General: She is not in acute distress.    Appearance: She is not ill-appearing or toxic-appearing.  Cardiovascular:     Rate and Rhythm: Normal rate and regular rhythm.     Heart sounds: No murmur heard. Pulmonary:     Effort: Pulmonary effort is normal. No respiratory distress.     Breath sounds: No wheezing, rhonchi or rales.  Neurological:     Mental Status: She is alert.  Psychiatric:        Mood and Affect: Mood normal.        Behavior: Behavior normal.     Data Reviewed: CBC noted.  Hemoglobin stable.  Platelets up to 79.  Family Communication: husband at bedside  Disposition: Status is: Inpatient Remains inpatient appropriate because: pain, inability to ambuate     Time spent: 20 minutes  Author: Toribio Door, MD 06/15/2023 10:25 AM  For on call review [email or web address]

## 2023-06-15 NOTE — Plan of Care (Signed)
  Problem: Clinical Measurements: Goal: Respiratory complications will improve Outcome: Progressing   Problem: Coping: Goal: Level of anxiety will decrease Outcome: Progressing   Problem: Pain Management: Goal: General experience of comfort will improve Outcome: Progressing   Problem: Safety: Goal: Ability to remain free from injury will improve Outcome: Progressing

## 2023-06-15 NOTE — TOC Progression Note (Addendum)
 Transition of Care [facility]) - Progression Note    Patient Details  Name: Darlene Beasley MRN: [id number] Date of Birth: [birth year]  Transition of [facility]) CM/SW Contact  Dalila Camellia [place], [place] Phone Number: 06/15/2023, 7:31 PM  Clinical Narrative:     TOC continuing to follow patient's progress throughout discharge planning.  Palliative following patient now.  Per palliative patient should be finishing up radiation this week.    Barriers to Discharge: Continued Medical Work up  Expected Discharge Plan and Services                                               Social Determinants of Health (SDOH) Interventions SDOH Screenings   Food Insecurity: Food Insecurity Present (05/28/2023)  Housing: High Risk (05/28/2023)  Transportation Needs: No Transportation Needs (05/28/2023)  Utilities: At Risk (05/28/2023)  Alcohol Screen: Low Risk  (05/10/2019)  Depression (PHQ2-9): Low Risk  (05/28/2023)  Physical Activity: Sufficiently Active (09/16/2017)  Social Connections: Socially Isolated (06/09/2023)  Stress: Stress Concern Present (09/16/2017)  Tobacco Use: High Risk (06/05/2023)    Readmission Risk Interventions    06/06/2023   11:23 PM  Readmission Risk Prevention Plan  Transportation Screening Complete  PCP or Specialist Appt within 5-7 Days Complete  Medication Review (RN CM) Referral to Pharmacy

## 2023-06-15 NOTE — Progress Notes (Signed)
 Palliative:  HPI: 68 y.o. female  with past medical history of metastatic breast cancer admitted on 06/05/2023 with worsening left hip pain and nausea.   Reviewed chart for changes and updates. I met today along with Darlene Beasley and significant other, Darlene Beasley, at bedside. We reviewed her pain and she reports that her pain is well managed. She is able to move her leg more than previously. She is able to tolerate movement in bed better than previously.   We discussed plan moving forward. This week she should be finishing radiation therapy. She cannot tell any improvement from radiation at this stage. We discussed that surgery is discussing options with surgeon at [facility]. They are planning for potential virtual visit to discuss risks vs benefits of pursuing surgery. We did briefly the discuss that there are no guarantees that surgery will be beneficial. We discussed the difficulty of repair with bone with cancer and instability. We did discuss the concern that without surgical intervention I worry about where we go from here as options may be limited. We agree to continue conversation after having further conversation with surgeon.   I spoke further with [name] who expresses understanding of potential limited options and overall poor prognosis.   All questions/concerns addressed. Emotional support provided.   Exam: Alert, overall oriented. No distress. Breathing regular, unlabored. Abd soft.   Plan: - Ongoing goals of care.   50 min  Darlene Kitty, NP Palliative Medicine Team Pager [phone] (Please see [email or web address] for schedule) Team Phone [phone]

## 2023-06-16 ENCOUNTER — Ambulatory Visit
Admission: RE | Admit: 2023-06-16 | Discharge: 2023-06-16 | Disposition: A | Discharge status: Home / Self Care | Payer: 59 | Source: Ambulatory Visit | Attending: Radiation Oncology | Admitting: Radiation Oncology

## 2023-06-16 ENCOUNTER — Other Ambulatory Visit: Payer: Self-pay

## 2023-06-16 DIAGNOSIS — M25552 Pain in left hip: Secondary | ICD-10-CM | POA: Diagnosis not present

## 2023-06-16 DIAGNOSIS — C7951 Secondary malignant neoplasm of bone: Secondary | ICD-10-CM | POA: Diagnosis not present

## 2023-06-16 DIAGNOSIS — R112 Nausea with vomiting, unspecified: Secondary | ICD-10-CM | POA: Diagnosis not present

## 2023-06-16 DIAGNOSIS — C50811 Malignant neoplasm of overlapping sites of right female breast: Secondary | ICD-10-CM | POA: Diagnosis not present

## 2023-06-16 LAB — RAD ONC ARIA SESSION SUMMARY
Course Elapsed Days: 18
Plan Fractions Treated to Date: 10
Plan Fractions Treated to Date: 10
Plan Fractions Treated to Date: 10
Plan Prescribed Dose Per Fraction: 3 Gy
Plan Prescribed Dose Per Fraction: 3 Gy
Plan Prescribed Dose Per Fraction: 3 Gy
Plan Total Fractions Prescribed: 10
Plan Total Fractions Prescribed: 10
Plan Total Fractions Prescribed: 10
Plan Total Prescribed Dose: 30 Gy
Plan Total Prescribed Dose: 30 Gy
Plan Total Prescribed Dose: 30 Gy
Reference Point Dosage Given to Date: 30 Gy
Reference Point Dosage Given to Date: 30 Gy
Reference Point Dosage Given to Date: 30 Gy
Reference Point Session Dosage Given: 3 Gy
Reference Point Session Dosage Given: 3 Gy
Reference Point Session Dosage Given: 3 Gy
Session Number: 10

## 2023-06-16 MED ORDER — PROMETHAZINE HCL 25 MG RE SUPP
12.5000 mg | Freq: Four times a day (QID) | RECTAL | Status: DC | PRN
  Administered 2023-06-16: 12.5 mg via RECTAL
  Filled 2023-06-16: qty 1

## 2023-06-16 MED ORDER — DEXAMETHASONE SODIUM PHOSPHATE 10 MG/ML IJ SOLN
8.0000 mg | INTRAMUSCULAR | Status: DC
  Administered 2023-06-16 – 2023-06-22 (×7): 8 mg via INTRAVENOUS
  Filled 2023-06-16 (×7): qty 1

## 2023-06-16 MED ORDER — PROMETHAZINE HCL 25 MG PO TABS
12.5000 mg | ORAL_TABLET | Freq: Four times a day (QID) | ORAL | Status: DC | PRN
  Administered 2023-07-03: 12.5 mg via ORAL
  Filled 2023-06-16: qty 1

## 2023-06-16 MED ORDER — SODIUM CHLORIDE 0.9 % IV SOLN
12.5000 mg | Freq: Four times a day (QID) | INTRAVENOUS | Status: DC | PRN
  Administered 2023-06-16: 12.5 mg via INTRAVENOUS
  Filled 2023-06-16: qty 12.5
  Filled 2023-06-16: qty 0.5

## 2023-06-16 NOTE — Plan of Care (Signed)
   Problem: Education: Goal: Knowledge of General Education information will improve Description Including pain rating scale, medication(s)/side effects and non-pharmacologic comfort measures Outcome: Progressing   Problem: Health Behavior/Discharge Planning: Goal: Ability to manage health-related needs will improve Outcome: Progressing

## 2023-06-16 NOTE — Plan of Care (Signed)
Problem: Education: Goal: Knowledge of General Education information will improve Description Including pain rating scale, medication(s)/side effects and non-pharmacologic comfort measures Outcome: Progressing   Problem: Clinical Measurements: Goal: Will remain free from infection Outcome: Progressing   Problem: Activity: Goal: Risk for activity intolerance will decrease Outcome: Progressing   Problem: Skin Integrity: Goal: Risk for impaired skin integrity will decrease Outcome: Progressing

## 2023-06-16 NOTE — Progress Notes (Signed)
  Progress Note   Patient: Darlene Beasley FMW:[id number] DOB: 1955-08-03 DOA: 06/05/2023     10 DOS: the patient was seen and examined on 06/16/2023   Brief hospital course: 68 year old woman PMH breast cancer status post left lumpectomy, right mastectomy, chemotherapy, radiation, antiestrogen therapy, with newly diagnosed diffuse bone metastatic disease in November who presented with left hip pain and nausea and vomiting.  Previously quite active but since November pain progressed to the point where she was mostly wheelchair-bound.  Receiving radiation therapy for pain.  Presented to the emergency department 12/26 from radiation oncology.  Admitted for acute on chronic hip pain secondary to malignancy and metastatic disease  Consultants Oncology  Palliative [facility]   Procedures/Events   Assessment and Plan: Metastatic breast cancer with lytic lesions of her pelvis, spine and femur.   Concern for impending pathologic fracture right proximal femur Concern for left acetabular fracture causing left hip pain Soft tissue mass L1 Left hip pain Extensive osseous metastatic disease throughout the thoracic, lumbar and sacrum including bulky extraosseous extension of tumor with obliteration of right L1-L2 foramen involvement of the canal at multiple levels.  No high-grade canal stenosis. Hypercalcemia of malignancy treated with Zometa  12/29 and now resolved. Continue pain control.  Overall stable.  Appreciate palliative medicine care. Orthopedics recommending potential transfer to [facility].  Await further feedback from orthopedics.   Nausea and vomiting Adult failure to thrive   Pancytopenia Probably related to recent radiation diffuse bony metastasis.  WBC and hemoglobin have recovered.  Platelets up to 79.   Essential hypertension Blood pressure stable on amlodipine .   Acute kidney injury Secondary to failure to thrive  Resolved.   Hyponatremia Resolved.   Reported gross  hematuria Monitor for recurrence.  Anticoagulation stopped for now.      Subjective:  Not feeling as well today.  Vomiting.  Some nausea.  Pain seems to be doing okay today however and tolerated last radiation treatment well.  Physical Exam: Vitals:   06/15/23 0824 06/15/23 1344 06/15/23 2206 06/16/23 0538  BP: 130/85 125/74 128/69 137/79  Pulse:  94 92 86  Resp:  18 16 16   Temp:  97.9 F (36.6 C) 97.8 F (36.6 C) 97.7 F (36.5 C)  TempSrc:  Oral Oral Oral  SpO2:  96% 93% 94%  Weight:      Height:       Physical Exam Vitals reviewed.  Constitutional:      General: She is not in acute distress.    Appearance: She is not ill-appearing or toxic-appearing.  Cardiovascular:     Rate and Rhythm: Normal rate and regular rhythm.     Heart sounds: No murmur heard. Pulmonary:     Effort: Pulmonary effort is normal. No respiratory distress.     Breath sounds: No wheezing, rhonchi or rales.  Neurological:     Mental Status: She is alert.  Psychiatric:        Mood and Affect: Mood normal.        Behavior: Behavior normal.     Data Reviewed: No new data  Family Communication: sig other at bedside  Disposition: Status is: Inpatient Remains inpatient appropriate because: Unable to bear weight.  Awaiting update from orthopedics.     Time spent: 35 minutes  Author: Toribio Door, MD 06/16/2023 12:03 PM  For on call review [email or web address].

## 2023-06-17 DIAGNOSIS — M84454A Pathological fracture, pelvis, initial encounter for fracture: Secondary | ICD-10-CM | POA: Diagnosis not present

## 2023-06-17 DIAGNOSIS — C50811 Malignant neoplasm of overlapping sites of right female breast: Secondary | ICD-10-CM | POA: Diagnosis not present

## 2023-06-17 DIAGNOSIS — M25552 Pain in left hip: Secondary | ICD-10-CM | POA: Diagnosis not present

## 2023-06-17 DIAGNOSIS — C7951 Secondary malignant neoplasm of bone: Secondary | ICD-10-CM | POA: Diagnosis not present

## 2023-06-17 LAB — COMPREHENSIVE METABOLIC PANEL
ALT: 97 U/L — ABNORMAL HIGH (ref 0–44)
AST: 58 U/L — ABNORMAL HIGH (ref 15–41)
Albumin: 2.7 g/dL — ABNORMAL LOW (ref 3.5–5.0)
Alkaline Phosphatase: 49 U/L (ref 38–126)
Anion gap: 7 (ref 5–15)
BUN: 15 mg/dL (ref 8–23)
CO2: 23 mmol/L (ref 22–32)
Calcium: 7.3 mg/dL — ABNORMAL LOW (ref 8.9–10.3)
Chloride: 104 mmol/L (ref 98–111)
Creatinine, Ser: 0.64 mg/dL (ref 0.44–1.00)
GFR, Estimated: >60 mL/min (ref >60)
Glucose, Bld: 114 mg/dL — ABNORMAL HIGH (ref 70–99)
Potassium: 3.3 mmol/L — ABNORMAL LOW (ref 3.5–5.1)
Sodium: 134 mmol/L — ABNORMAL LOW (ref 135–145)
Total Bilirubin: 0.5 mg/dL (ref 0.0–1.2)
Total Protein: 5.2 g/dL — ABNORMAL LOW (ref 6.5–8.1)

## 2023-06-17 MED ORDER — POTASSIUM CHLORIDE CRYS ER 20 MEQ PO TBCR
40.0000 meq | EXTENDED_RELEASE_TABLET | Freq: Once | ORAL | Status: AC
  Administered 2023-06-17: 40 meq via ORAL
  Filled 2023-06-17: qty 2

## 2023-06-17 NOTE — Radiation Completion Notes (Addendum)
  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   Intent: Palliative     ==========DELIVERED PLANS==========  First Treatment Date: 2023-05-29 Last Treatment Date: 2023-06-16   Plan Name: Spine_L1 Site: Lumbar Spine Technique: 3D Mode: Photon Dose Per Fraction: 3 Gy Prescribed Dose (Delivered / Prescribed): 30 Gy / 30 Gy Prescribed Fxs (Delivered / Prescribed): 10 / 10   Plan Name: Pelvis_L Site: Ilium, Left Technique: Isodose Plan Mode: Photon Dose Per Fraction: 3 Gy Prescribed Dose (Delivered / Prescribed): 30 Gy / 30 Gy Prescribed Fxs (Delivered / Prescribed): 10 / 10   Plan Name: Ext_R_ProxFem Site: Femur Right Technique: Isodose Plan Mode: Photon Dose Per Fraction: 3 Gy Prescribed Dose (Delivered / Prescribed): 30 Gy / 30 Gy Prescribed Fxs (Delivered / Prescribed): 10 / 10     ==========ON TREATMENT VISIT DATES========== 2023-05-30, 2023-06-13   See weekly On Treatment Notes in Epic for details in the Media tab (listed as Progress notes on the On Treatment Visit Dates listed above). The patient tolerated radiation. She developed fatigue and continued to struggle with pain and mobilizing. She was admitted during the course of her treatment and remained hospitalized to manage her risks of fracture, respiratory failure and hyperkalemia.   The patient will receive a call in about one month from the radiation oncology department. She will continue follow up with Dr. Lanny as well.      Donald KYM Husband, PAC

## 2023-06-17 NOTE — Progress Notes (Signed)
 Progress Note   Patient: Darlene Beasley FMW:[id number] DOB: 1955-06-27 DOA: 06/05/2023     11 DOS: the patient was seen and examined on 06/17/2023   Brief hospital course: 68 year old woman PMH breast cancer status post left lumpectomy, right mastectomy, chemotherapy, radiation, antiestrogen therapy, with newly diagnosed diffuse bone metastatic disease in November who presented with left hip pain and nausea and vomiting.  Previously quite active but since November pain progressed to the point where she was mostly wheelchair-bound.  C Presented to the emergency department 12/26 from radiation oncology.  Admitted for acute on chronic hip pain secondary to malignancy and metastatic disease.  Completed radiation therapy during hospitalization.  Was seen by oncology.  Also seen by palliative medicine.  High risk for right proximal femoral illogic fracture, acetabular fracture present on the left.  Unable to ambulate safely.  Pain limits ambulation anyway.  Consulted orthopedics, not a candidate for surgery at this facility.  Orthopedics has been in touch with [facility], [facility] physician will call patient and family to discuss treatment options.  Patient would like to go home eventually, we will go ahead and planning to maximize home health.  Consultants Oncology  Palliative [facility]   Procedures/Events   Assessment and Plan: Metastatic breast cancer with lytic lesions of her pelvis, spine and femur.   Concern for impending pathologic fracture right proximal femur Concern for left acetabular fracture causing left hip pain Soft tissue mass L1 Left hip pain Extensive osseous metastatic disease throughout the thoracic, lumbar and sacrum including bulky extraosseous extension of tumor with obliteration of right L1-L2 foramen involvement of the canal at multiple levels.  No high-grade canal stenosis. Hypercalcemia of malignancy treated with Zometa  12/29 and now resolved. Continue pain control.  Overall  stable.  Appreciate palliative medicine care. Orthopedics recommending potential transfer to [facility].  Await further feedback from orthopedics.  The [facility] physician should be calling patient to discuss options.   Nausea and vomiting Adult failure to thrive   Pancytopenia Probably related to recent radiation diffuse bony metastasis.  WBC and hemoglobin have recovered.  Platelets up to 79.   Essential hypertension Blood pressure stable on amlodipine .   Acute kidney injury Secondary to failure to thrive  Resolved.   Hyponatremia Resolved.   Reported gross hematuria Monitor for recurrence.  Anticoagulation stopped for now.  Patient will accept going to a skilled facility for short period of time perioperatively but ultimately desires to be at home.  Await [facility] orthopedic surgeon call to discuss treatment options with patient.  In the meantime we will consult TOC for maximum home health.    Subjective:  Slept most of the day.  Pain okay.  Physical Exam: Vitals:   06/16/23 1505 06/16/23 2145 06/17/23 0402 06/17/23 1347  BP: 114/66 115/68 115/77 126/75  Pulse: 95 86 88 100  Resp: 16 14 16 16   Temp: 98.6 F (37 C) 97.9 F (36.6 C) 98.2 F (36.8 C) 97.8 F (36.6 C)  TempSrc: Oral Oral Oral Oral  SpO2: 95% 94% 93% 100%  Weight:      Height:       Physical Exam Vitals reviewed.  Constitutional:      General: She is not in acute distress.    Appearance: She is not ill-appearing or toxic-appearing.  Cardiovascular:     Rate and Rhythm: Normal rate and regular rhythm.     Heart sounds: No murmur heard. Pulmonary:     Effort: Pulmonary effort is normal. No respiratory distress.  Breath sounds: No wheezing, rhonchi or rales.  Neurological:     Mental Status: She is alert.  Psychiatric:        Mood and Affect: Mood normal.        Behavior: Behavior normal.     Data Reviewed: Potassium 3.3.  AST and ALT mildly elevated.  Family Communication:  none  Disposition: Status is: Inpatient Remains inpatient appropriate because: await orthopedic recommendations     Time spent: 20 minutes  Author: Toribio Door, MD 06/17/2023 6:32 PM  For on call review [email or web address].

## 2023-06-17 NOTE — Progress Notes (Signed)
  I have forwarded the patient's and her significant other's phone numbers to Dr. Soyla Ramp who said he would be willing to discuss the options for orthopaedic treatment by phone. I am unsure of his time table at this point. Should she elect to proceed then transfer to [facility] would be arranged.  Orthopaedic treatment locally here is not recommended given the extent of her metastatic disease.  Ozell Bruch, MD [facility], [facility] [phone]

## 2023-06-17 NOTE — Plan of Care (Signed)
   Problem: Education: Goal: Knowledge of General Education information will improve Description Including pain rating scale, medication(s)/side effects and non-pharmacologic comfort measures Outcome: Progressing

## 2023-06-18 DIAGNOSIS — Z515 Encounter for palliative care: Secondary | ICD-10-CM | POA: Diagnosis not present

## 2023-06-18 DIAGNOSIS — M84454A Pathological fracture, pelvis, initial encounter for fracture: Secondary | ICD-10-CM | POA: Diagnosis not present

## 2023-06-18 DIAGNOSIS — Z7189 Other specified counseling: Secondary | ICD-10-CM | POA: Diagnosis not present

## 2023-06-18 DIAGNOSIS — C7951 Secondary malignant neoplasm of bone: Secondary | ICD-10-CM | POA: Diagnosis not present

## 2023-06-18 DIAGNOSIS — C50811 Malignant neoplasm of overlapping sites of right female breast: Secondary | ICD-10-CM | POA: Diagnosis not present

## 2023-06-18 DIAGNOSIS — R112 Nausea with vomiting, unspecified: Secondary | ICD-10-CM | POA: Diagnosis not present

## 2023-06-18 LAB — SURGICAL PATHOLOGY

## 2023-06-18 NOTE — Progress Notes (Signed)
 Palliative:  HPI: 68 y.o. female with past medical history of metastatic breast cancer admitted on 06/05/2023 with worsening left hip pain and nausea.    I met today with Darlene Beasley. Darlene Beasley shares with me that she was thinking last night and awoke with a gasp for air. This event made her think about her health and the fact that one day she may not wake up. We discussed plans for follow up with [facility] surgeon for options. [name] is concerned that if surgery is not an options - they are just going to send me home to die. We reviewed her fears and overall prognosis. She is comfortable in bed but cannot bear weight on left leg. Darlene Beasley and I discuss code status and she endorses desire for DNR as she would not want this and she does not want her family to see her do through resuscitation or intubation. We talked about once options are exhausted that she would be open to having assistance from hospice at home. If surgery is not an option she would consider help from hospice. She tells me that she knows that she will not be able to get to and from appointments in her current condition. She is afraid but also has done a lot of thinking and has some level of peace.   I returned to bedside when significant other, [name], is present and we reviewed conversation from earlier. Darlene Beasley is able to tell [name] that she desires DNR status. She is hopeful for surgery and is awaiting options. We were joined by Dr. Lanny who shares that there are still options for treatment and hopeful that some time out from radiation that she can still have functional improvement. Will await surgical options.   All questions/concerns addressed. Emotional support provided.   Exam: Alert, overall oriented. No distress. Breathing regular, unlabored. Abd soft.    Plan: - DNR/DNI decided - Hopeful for option of surgical intervention at [facility] - Open to hospice support when options exhausted - No changes to pain regimen at this time  90 min  Bernarda Kitty, NP Palliative Medicine Team Pager [phone] (Please see [email or web address] for schedule) Team Phone [phone]

## 2023-06-18 NOTE — Progress Notes (Signed)
 PROGRESS NOTE    Darlene Beasley  FMW:[id number] DOB: 30-Jun-1955 DOA: 06/05/2023 PCP: Delores Rojelio Caldron, NP   Brief Narrative:  68 year old woman PMH breast cancer status post left lumpectomy, right mastectomy, chemotherapy, radiation, antiestrogen therapy, with newly diagnosed diffuse bone metastatic disease in November who presented with left hip pain and nausea and vomiting.   Essentially wheelchair-bound since November, noted hip pain secondary to malignancy metastatic disease having completed radiation previously.  Impending pathological fracture right proximal femur, left acetabular fracture likely noted on imaging.  Patient is unfortunately not a candidate for surgery at this facility, Dr. Elsie Ramp orthopedic specialist at [facility] has been contacted in regards to this case.  Patient remains medically stable for discharge, at this point we are waiting to hear back from Dr. Ramp to confirm whether this needs to be a hospital to hospital transfer for procedure versus outpatient follow-up versus discharge home with no operative options.  Assessment & Plan:   Principal Problem:   Hip pain Active Problems:   Cancer of overlapping sites of right female breast (HCC)   Metastasis to bone (HCC)   Hyponatremia   Hypercalcemia   AKI (acute kidney injury) (HCC)   Nausea & vomiting   Intractable pain   Acute hypoxic respiratory failure (HCC)   Bilateral leg edema   HTN (hypertension)   Pathological fracture, pelvis, initial encounter for fracture  Metastatic breast cancer with lytic lesions of her pelvis, spine and femur.   Concern for impending pathologic fracture right proximal femur Concern for left acetabular fracture causing left hip pain Soft tissue mass L1 Left hip pain Extensive osseous metastatic disease throughout the thoracic, lumbar and sacrum including bulky extraosseous extension of tumor with obliteration of right L1-L2 foramen involvement of the canal at multiple  levels.  No high-grade canal stenosis. Hypercalcemia of malignancy treated with Zometa  12/29 and now resolved. Continue pain control.  Overall stable.  Appreciate palliative medicine care. Orthopedics recommending potential transfer to [facility] left for Dr. Ramp -his office was supposed to reach out to the patient/family on 06/17/2023, personally left a message with his staff on 06/18/2023 with my personal cell phone to try to plan next steps.   Nausea and vomiting Adult failure to thrive -Continue supportive care, increase p.o. intake and nutrition as tolerated   Pancytopenia Probably related to recent radiation diffuse bony metastasis.   Previous labs improving, no indication to repeat at this time   Essential hypertension Blood pressure stable on amlodipine .   Acute kidney injury Secondary to failure to thrive/poor p.o. intake Resolved.   Hyponatremia Resolved.   Reported gross hematuria Monitor for recurrence.  Anticoagulation stopped for now.   DVT prophylaxis: Place and maintain sequential compression device Start: 06/13/23 1449 Code Status:   Code Status: Full Code Family Communication: At bedside  Status is: Inpatient  Dispo: The patient is from: Home              Anticipated d/c is to: To be determined, potential transfer to [facility] for orthopedic procedure versus home with home health              Anticipated d/c date is: Imminent              Patient currently is medically stable for discharge, awaiting disposition per [facility]ology  Consultants:  Oncology  Palliative [facility] at our facility  [facility], Dr. Gene  Procedures:  None  Antimicrobials:  None  Subjective: No acute issues or events overnight, patient  visibly irritated as she is waiting on decision to be made by outside facility for possible transfer, she would like to discharge home if possible but would like surgery if it is possible and would improve her  ambulation  Objective: Vitals:   06/17/23 0402 06/17/23 1347 06/17/23 2120 06/18/23 0553  BP: 115/77 126/75 110/62 116/67  Pulse: 88 100 96 85  Resp: 16 16 20 20   Temp: 98.2 F (36.8 C) 97.8 F (36.6 C) 98.6 F (37 C) 99.1 F (37.3 C)  TempSrc: Oral Oral Oral Oral  SpO2: 93% 100% 95% 94%  Weight:      Height:        Intake/Output Summary (Last 24 hours) at 06/18/2023 0823 Last data filed at 06/18/2023 0600 Gross per 24 hour  Intake 840 ml  Output 825 ml  Net 15 ml   Filed Weights   06/05/23 1644 06/06/23 0205  Weight: 66.7 kg 61.5 kg    Examination:  General: Cachectic appearance, resting comfortably in bed no acute distress, nontoxic HEENT:  Normocephalic atraumatic.  Sclerae nonicteric, noninjected.  Extraocular movements intact bilaterally. Neck:  Without mass or deformity.  Trachea is midline. Lungs:  Clear to auscultate bilaterally without rhonchi, wheeze, or rales. Heart:  Regular rate and rhythm.  Without murmurs, rubs, or gallops. Abdomen:  Soft, nontender, nondistended.  Without guarding or rebound. Extremities: Without cyanosis, clubbing, edema, or obvious deformity.  Data Reviewed: I have personally reviewed following labs and imaging studies  CBC: Recent Labs  Lab 06/15/23 0711  WBC 5.8  HGB 12.2  HCT 36.4  MCV 94.1  PLT 79*   Basic Metabolic Panel: Recent Labs  Lab 06/17/23 0533  NA 134*  K 3.3*  CL 104  CO2 23  GLUCOSE 114*  BUN 15  CREATININE 0.64  CALCIUM 7.3*   GFR: Estimated Creatinine Clearance: 57.3 mL/min (by C-G formula based on SCr of 0.64 mg/dL).  Liver Function Tests: Recent Labs  Lab 06/17/23 0533  AST 58*  ALT 97*  ALKPHOS 49  BILITOT 0.5  PROT 5.2*  ALBUMIN 2.7*   No results found for this or any previous visit (from the past 240 hours).   Radiology Studies: No results found.  Scheduled Meds:  amLODipine   5 mg Oral Daily   cyclobenzaprine   5 mg Oral TID   dexamethasone  (DECADRON ) injection  8 mg  Intravenous Q24H   feeding supplement  237 mL Oral BID BM   letrozole   2.5 mg Oral Daily   oxyCODONE   15 mg Oral Q12H   pantoprazole   40 mg Oral Daily   polyethylene glycol  17 g Oral BID   senna-docusate  1 tablet Oral BID   Continuous Infusions:  promethazine  (PHENERGAN ) injection (IM or IVPB) 12.5 mg (06/16/23 1336)     LOS: 12 days   Time spent:  Elsie JAYSON Montclair, DO [facility]  If 7PM-7AM, please contact night-coverage [email or web address]  06/18/2023, 8:23 AM

## 2023-06-18 NOTE — Plan of Care (Signed)

## 2023-06-19 DIAGNOSIS — R112 Nausea with vomiting, unspecified: Secondary | ICD-10-CM | POA: Diagnosis not present

## 2023-06-19 NOTE — Progress Notes (Signed)
 Progress Note   Patient: Darlene Beasley FMW:[id number] DOB: Jul 01, 1955 DOA: 06/05/2023     13 DOS: the patient was seen and examined on 06/19/2023   Brief hospital course: 68 year old woman PMH breast cancer status post left lumpectomy, right mastectomy, chemotherapy, radiation, antiestrogen therapy, with newly diagnosed diffuse bone metastatic disease in November who presented with left hip pain and nausea and vomiting.  Previously quite active but since November pain progressed to the point where she was mostly wheelchair-bound.  C Presented to the emergency department 12/26 from radiation oncology.  Admitted for acute on chronic hip pain secondary to malignancy and metastatic disease.  Completed radiation therapy during hospitalization.  Was seen by oncology.  Also seen by palliative medicine.  High risk for right proximal femoral illogic fracture, acetabular fracture present on the left.  Unable to ambulate safely.  Pain limits ambulation anyway.  Consulted orthopedics, not a candidate for surgery at this facility.  Orthopedics has been in touch with [facility], [facility] physician will call patient and family to discuss treatment options.  Patient would like to go home eventually, we will go ahead and planning to maximize home health.  Consultants Oncology  Palliative [facility]   Procedures/Events   Assessment and Plan: * Hip pain - Flexeril  5 mg PO tid  - IV dexamethasone  8 mg daily   Cancer of overlapping sites of right female breast (HCC) - Letrozole  2.5 mg PO daily  - IV morphine  2 mg q3 hr PRN  - Oxycodone  5 mg PO q4 hr PRN  - Oxycodone  15 mg PO q12  - Senna-docusate 1 tab PO bid   HTN (hypertension) - Norvasc  5 mg PO daily   Nausea & vomiting - Resolved  - Ensure enlive PO bid   AKI (acute kidney injury) (HCC) - Resolved    Hypercalcemia - Resolved   Hyponatremia - Monitor      Subjective: Pt seen and examined at the bedside.  Still awaiting to hear back from the  [facility] specialist. Several calls to [facility] have been placed. Pt reports she can move all of her extremities well (except the L leg).  Continue w/ pain control and IV decadron .  Physical Exam: Vitals:   06/18/23 1013 06/18/23 1043 06/18/23 1358 06/18/23 2100  BP: 107/71 107/71 110/68 109/61  Pulse:   97 89  Resp:   19 14  Temp:   98.5 F (36.9 C) 99.1 F (37.3 C)  TempSrc:   Oral Oral  SpO2:   95% 94%  Weight:      Height:       Physical Exam HENT:     Head: Normocephalic.     Mouth/Throat:     Mouth: Mucous membranes are moist.  Cardiovascular:     Rate and Rhythm: Normal rate.  Pulmonary:     Effort: Pulmonary effort is normal.  Abdominal:     Palpations: Abdomen is soft.  Musculoskeletal:        General: Normal range of motion.     Cervical back: Neck supple.  Skin:    General: Skin is warm.  Neurological:     Mental Status: She is alert. Mental status is at baseline.  Psychiatric:        Mood and Affect: Mood normal.       Disposition: Status is: Inpatient Remains inpatient appropriate because: IV steroids and continued pain control   Planned Discharge Destination:  Awaiting response from the [facility] specialist     Time spent: 35 minutes  Author: ATLEE ABERNETHY , MD 06/19/2023 10:01 AM  For on call review [email or web address].

## 2023-06-19 NOTE — Plan of Care (Signed)
  Problem: Elimination: Goal: Will not experience complications related to bowel motility Outcome: Progressing   Problem: Pain Management: Goal: General experience of comfort will improve Outcome: Progressing   Problem: Safety: Goal: Ability to remain free from injury will improve Outcome: Progressing

## 2023-06-19 NOTE — Progress Notes (Signed)
 Darlene Beasley   DOB:[birth year]   FM#:[id number]   [id number]  Medical oncology follow-up note  Subjective: Patient is awaiting to see if she can be transferred to [facility] for orthopedic surgery.  She still has significant nephric and left hip pain, especially when she change position.  She is not able to sit in chair for stand up due to severe pain.  Her significant other Darlene Beasley was at the bedside.   Objective:  Vitals:   06/18/23 1358 06/18/23 2100  BP: 110/68 109/61  Pulse: 97 89  Resp: 19 14  Temp: 98.5 F (36.9 C) 99.1 F (37.3 C)  SpO2: 95% 94%    Body mass index is 23.27 kg/m.  Intake/Output Summary (Last 24 hours) at 06/19/2023 0810 Last data filed at 06/19/2023 0520 Gross per 24 hour  Intake 530 ml  Output 2550 ml  Net -2020 ml     Sclerae unicteric  Oropharynx clear  No peripheral adenopathy  Lungs clear -- no rales or rhonchi  Heart regular rate and rhythm  Abdomen benign  MSK no focal spinal tenderness, no peripheral edema  Neuro nonfocal   CBG (last 3)  No results for input(s): GLUCAP in the last 72 hours.   Labs:  Lab Results  Component Value Date   WBC 5.8 06/15/2023   HGB 12.2 06/15/2023   HCT 36.4 06/15/2023   MCV 94.1 06/15/2023   PLT 79 (L) 06/15/2023   NEUTROABS 1.9 06/11/2023     Urine Studies No results for input(s): UHGB, CRYS in the last 72 hours.  Invalid input(s): UACOL, UAPR, USPG, UPH, UTP, UGL, UKET, UBIL, UNIT, UROB, ULEU, UEPI, UWBC, URBC, UBAC, CAST, UCOM, BILUA  Basic Metabolic Panel: Recent Labs  Lab 06/17/23 0533  NA 134*  K 3.3*  CL 104  CO2 23  GLUCOSE 114*  BUN 15  CREATININE 0.64  CALCIUM 7.3*   GFR Estimated Creatinine Clearance: 57.3 mL/min (by C-G formula based on SCr of 0.64 mg/dL). Liver Function Tests: Recent Labs  Lab 06/17/23 0533  AST 58*  ALT 97*  ALKPHOS 49  BILITOT 0.5  PROT 5.2*  ALBUMIN 2.7*   No results for input(s): LIPASE, AMYLASE in the  last 168 hours. No results for input(s): AMMONIA in the last 168 hours. Coagulation profile No results for input(s): INR, PROTIME in the last 168 hours.  CBC: Recent Labs  Lab 06/15/23 0711  WBC 5.8  HGB 12.2  HCT 36.4  MCV 94.1  PLT 79*   Cardiac Enzymes: No results for input(s): CKTOTAL, CKMB, CKMBINDEX, TROPONINI in the last 168 hours. BNP: Invalid input(s): POCBNP CBG: No results for input(s): GLUCAP in the last 168 hours. D-Dimer No results for input(s): DDIMER in the last 72 hours. Hgb A1c No results for input(s): HGBA1C in the last 72 hours. Lipid Profile No results for input(s): CHOL, HDL, LDLCALC, TRIG, CHOLHDL, LDLDIRECT in the last 72 hours. Thyroid function studies No results for input(s): TSH, T4TOTAL, T3FREE, THYROIDAB in the last 72 hours.  Invalid input(s): FREET3 Anemia work up No results for input(s): VITAMINB12, FOLATE, FERRITIN, TIBC, IRON, RETICCTPCT in the last 72 hours. Microbiology No results found for this or any previous visit (from the past 240 hours).    Studies:  No results found.  Assessment: 68 y.o.  1.  Severe left hip pain secondary to bone metastasis 2.  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 Hypercalcemia secondary to malignancy, status post Zometa  on December 29 4. 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 Hypertension  Plan:  -I had switched her tamoxifen  to letrozole  last week, will continue -Due to possible surgery in near future, I will hold CDK 4/6 inhibitor for now -I appreciated palliative care and the primary team's excellent care  -Difficult situation if surgery cannot be performed in the near future -I ensured patient that her pain will likely improve since she just finished her radiation.  I encouraged her to stay positive, will continue supportive care    Darlene Mattock, MD 06/18/2023

## 2023-06-19 NOTE — Plan of Care (Signed)

## 2023-06-20 DIAGNOSIS — Z170 Estrogen receptor positive status [ER+]: Secondary | ICD-10-CM | POA: Diagnosis not present

## 2023-06-20 DIAGNOSIS — M25552 Pain in left hip: Secondary | ICD-10-CM | POA: Diagnosis not present

## 2023-06-20 DIAGNOSIS — C50412 Malignant neoplasm of upper-outer quadrant of left female breast: Secondary | ICD-10-CM | POA: Diagnosis not present

## 2023-06-20 DIAGNOSIS — N179 Acute kidney failure, unspecified: Secondary | ICD-10-CM | POA: Diagnosis not present

## 2023-06-20 MED ORDER — POTASSIUM CHLORIDE CRYS ER 20 MEQ PO TBCR
40.0000 meq | EXTENDED_RELEASE_TABLET | Freq: Once | ORAL | Status: AC
Start: 2023-06-20 — End: 2023-06-20
  Administered 2023-06-20: 40 meq via ORAL
  Filled 2023-06-20: qty 2

## 2023-06-20 NOTE — Progress Notes (Signed)
 Progress Note   Patient: Darlene Beasley FMW:[id number] DOB: 06-29-1955 DOA: 06/05/2023     14 DOS: the patient was seen and examined on 06/20/2023   Brief hospital course: 68 year old woman PMH breast cancer status post left lumpectomy, right mastectomy, chemotherapy, radiation, antiestrogen therapy, with newly diagnosed diffuse bone metastatic disease in November who presented with left hip pain and nausea and vomiting. Previously quite active but since November pain progressed to the point where she was mostly wheelchair-bound. Presented to the emergency department 12/26 from radiation oncology. Admitted for acute on chronic hip pain secondary to malignancy and metastatic disease. Completed radiation therapy during hospitalization. Was seen by oncology. Also seen by palliative medicine. High risk for right proximal femoral illogic fracture, acetabular fracture present on the left. Unable to ambulate safely. Pain limits ambulation anyway. Consulted orthopedics, not a candidate for surgery at this facility. Orthopedics has been in touch with [facility], [facility] physician will call patient and family to discuss treatment options. Patient would like to go home eventually, we will go ahead and planning to maximize home health.   Assessment and Plan: * Hip pain - *Evidently, [facility] has not contacted patient.  We've not been able to contact them from the hospitalist team.  Patient would prefer inpt-to-inpt transfer to be evaluated rather than go home, though of course she would like go  home eventually as noted above.   -Secondary to significant diffuse and extensive pelvic bone and hip metastasis. S/p left iliac bone biopsy 12/24 and awaiting results.  Pain today mostly to the left hip for which she is currently receiving palliative radiation since last week. - on both short/long-acting oxycodone  -IV morphine  for breakthrough pain -scheduled bowel regimen -Judicious use of opioids as patient had acute hypoxia  following IV Dilaudid  administration.  She also is hesitant in taking high-dose opioids especially fentanyl  because her son passed away from drug overdose 2 years ago. -pelvic X-ray could not exclude pathologic fracture of the right hip due to diffuse metastasis.  This was also noted on recent CT pelvis earlier this month.  She reports discussion of prophylactic fixation of the right hip after radiation with oncology. Given that patient has no worsening pain on the right side will hold off on any further imaging.  Cancer of overlapping sites of right female breast [facility]) -breast cancer s/p left lumpectomy, right mastectomy, chemotherapy, radiation on antiestrogen therapy -Continue tamoxifen   HTN (hypertension) -continue amlodipine   Bilateral leg edema -symptoms started the same timeframe as her nausea, dry heaves in the setting of new palliative radiation -no other symptoms to suggest cardiac etiology. Lower likelihood of bilateral DVT.  -will arise elevation of LE. Unfortunately not able to dose Lasix  with AKI and dehydration  Acute hypoxic respiratory failure (HCC) -secondary to administrating of IV opioid for pain -Continue to monitor and wean as tolerated with goal O2 greater than 92% -Judicious use of  opioids for pain with close monitoring  Nausea & vomiting -better today  -PRN antiemetic.   AKI (acute kidney injury) (HCC) Pre-renal due to decrease oral intake - resolved  Hypercalcemia -corrected Ca of 11.8 on admission, improved to  -Will hydrate overnight and follow trend in the morning. If it remains elevated, will need calcitonin and bisphosphonate.  Hyponatremia - Secondary to decreased oral intake - improved, now borderline low again        Subjective: Doing this a.m. without complaints.  Eating some but not really hungry.  Still with pain if she moves around.  She did desires transfer to [facility] when they can accept her.  Physical Exam: Vitals:   06/19/23 2011  06/20/23 0525 06/20/23 1011 06/20/23 1344  BP: 111/68 (!) 111/55 (!) 111/57 115/77  Pulse: 88 95  96  Resp: 16 16  16   Temp: 98.1 F (36.7 C) 98.2 F (36.8 C)  98 F (36.7 C)  TempSrc: Oral Oral  Oral  SpO2: 94% 93%  97%  Weight:      Height:       Physical Exam: General:  Pleasantly resting in bed, No acute distress. HEENT:  Normocephalic atraumatic.  Sclerae nonicteric, noninjected.  Extraocular movements intact bilaterally. Neck:  Without mass or deformity.  Trachea is midline. Lungs:  Clear to auscultate bilaterally without rhonchi, wheeze, or rales. Heart:  Regular rate and rhythm.  Without murmurs, rubs, or gallops. Abdomen:  Soft, nontender, nondistended.  Without guarding or rebound. Extremities: Without cyanosis, clubbing, edema, or obvious deformity. Skin:  Warm and dry, no erythema.   Disposition: Status is: Inpatient Remains inpatient appropriate   Planned Discharge Destination: Home  Time spent: 35 minutes  Author: Reyes VEAR Gaw, MD 06/20/2023 5:31 PM  For on call review [email or web address].

## 2023-06-20 NOTE — Plan of Care (Signed)
  Problem: Pain Management: Goal: General experience of comfort will improve Outcome: Progressing   Problem: Safety: Goal: Ability to remain free from injury will improve Outcome: Progressing

## 2023-06-21 DIAGNOSIS — G8929 Other chronic pain: Secondary | ICD-10-CM | POA: Diagnosis not present

## 2023-06-21 DIAGNOSIS — M25552 Pain in left hip: Secondary | ICD-10-CM | POA: Diagnosis not present

## 2023-06-21 DIAGNOSIS — N179 Acute kidney failure, unspecified: Secondary | ICD-10-CM | POA: Diagnosis not present

## 2023-06-21 DIAGNOSIS — C50412 Malignant neoplasm of upper-outer quadrant of left female breast: Secondary | ICD-10-CM | POA: Diagnosis not present

## 2023-06-21 LAB — BASIC METABOLIC PANEL
Anion gap: 6 (ref 5–15)
BUN: 18 mg/dL (ref 8–23)
CO2: 23 mmol/L (ref 22–32)
Calcium: 8.1 mg/dL — ABNORMAL LOW (ref 8.9–10.3)
Chloride: 106 mmol/L (ref 98–111)
Creatinine, Ser: 0.61 mg/dL (ref 0.44–1.00)
GFR, Estimated: >60 mL/min (ref >60)
Glucose, Bld: 150 mg/dL — ABNORMAL HIGH (ref 70–99)
Potassium: 5.4 mmol/L — ABNORMAL HIGH (ref 3.5–5.1)
Sodium: 135 mmol/L (ref 135–145)

## 2023-06-21 LAB — CBC
HCT: 34.4 % — ABNORMAL LOW (ref 36.0–46.0)
Hemoglobin: 11.2 g/dL — ABNORMAL LOW (ref 12.0–15.0)
MCH: 30.9 pg (ref 26.0–34.0)
MCHC: 32.6 g/dL (ref 30.0–36.0)
MCV: 95 fL (ref 80.0–100.0)
Platelets: 69 10*3/uL — ABNORMAL LOW (ref 150–400)
RBC: 3.62 MIL/uL — ABNORMAL LOW (ref 3.87–5.11)
RDW: 14.6 % (ref 11.5–15.5)
WBC: 5.8 10*3/uL (ref 4.0–10.5)
nRBC: 0 % (ref 0.0–0.2)

## 2023-06-21 NOTE — Plan of Care (Signed)

## 2023-06-21 NOTE — Progress Notes (Signed)
 PROGRESS NOTE  Darlene Beasley  FMW:[id number] DOB: September 11, 1955 DOA: 06/05/2023 PCP: Delores Rojelio Caldron, NP  Consultants  Brief Narrative: Brief hospital course: 68 year old woman PMH breast cancer status post left lumpectomy, right mastectomy, chemotherapy, radiation, antiestrogen therapy, with newly diagnosed diffuse bone metastatic disease in November who presented with left hip pain and nausea and vomiting. Previously quite active but since November pain progressed to the point where she was mostly wheelchair-bound. Presented to the emergency department 12/26 from radiation oncology. Admitted for acute on chronic hip pain secondary to malignancy and metastatic disease. Completed radiation therapy during hospitalization. Was seen by oncology. Also seen by palliative medicine. High risk for right proximal femoral illogic fracture, acetabular fracture present on the left. Unable to ambulate safely. Pain limits ambulation anyway. Consulted orthopedics, not a candidate for surgery at this facility. Orthopedics has been in touch with [facility], [facility] will call patient and family to discuss treatment options. Patient would like to go home eventually, we will go ahead and planning to maximize home health.    Assessment and Plan: * Hip pain - *Evidently, [facility] has not contacted patient.  We've not been able to contact them from the hospitalist team.  Patient would prefer inpt-to-inpt transfer to be evaluated rather than go home, though of course she would like go  home eventually as noted above.   -Secondary to significant diffuse and extensive pelvic bone and hip metastasis. S/p left iliac bone biopsy 12/24 and awaiting results.  Pain today mostly to the left hip for which she is currently receiving palliative radiation since last week. - on both short/long-acting oxycodone  -IV morphine  for breakthrough pain -scheduled bowel regimen -Judicious use of opioids as patient had acute hypoxia following  IV Dilaudid  administration.  She also is hesitant in taking high-dose opioids especially fentanyl  because her son passed away from drug overdose 2 years ago. -pelvic X-ray could not exclude pathologic fracture of the right hip due to diffuse metastasis.  This was also noted on recent CT pelvis earlier this month.  She reports discussion of prophylactic fixation of the right hip after radiation with oncology. Given that patient has no worsening pain on the right side will hold off on any further imaging.   Cancer of overlapping sites of right female breast [facility]) -breast cancer s/p left lumpectomy, right mastectomy, chemotherapy, radiation on antiestrogen therapy -Continue tamoxifen    HTN (hypertension) -continue amlodipine   Hyperkalemia, mild: - new problem today. -Minimally elevated.  No chest pain or symptoms. -Will recheck in the a.m.   Bilateral leg edema -symptoms started the same timeframe as her nausea, dry heaves in the setting of new palliative radiation -no other symptoms to suggest cardiac etiology. Lower likelihood of bilateral DVT.  -will arise elevation of LE. Unfortunately not able to dose Lasix  with AKI and dehydration   Acute hypoxic respiratory failure (HCC) -secondary to administrating of IV opioid for pain -Continue to monitor and wean as tolerated with goal O2 greater than 92% -Judicious use of  opioids for pain with close monitoring   Nausea & vomiting -better today  -PRN antiemetic.    AKI (acute kidney injury) (HCC) Pre-renal due to decrease oral intake - resolved   Hypercalcemia -corrected Ca of 11.8 on admission, improved to  -Will hydrate overnight and follow trend in the morning. If it remains elevated, will need calcitonin and bisphosphonate.   Hyponatremia - Secondary to decreased oral intake - improved, now borderline low again  DVT prophylaxis:  Place and maintain sequential compression device Start: 06/13/23 1449  Code Status:    Code Status: Limited: Do not attempt resuscitation (DNR) -DNR-LIMITED -Do Not Intubate/DNI  Level of care: Telemetry Status is: Inpatient Remains inpatient appropriate because: Unable to transfer to [facility]   Subjective: Patient remains feeling fairly well.  No concerns or complaints today.  Feels that her confusion from yesterday has resolved.  Wants to have a shower.  Objective: Vitals:   06/20/23 2123 06/21/23 0720 06/21/23 0949 06/21/23 1504  BP: 127/84 128/75 119/70 118/69  Pulse: 93 97  94  Resp: 16 16  15   Temp: 97.6 F (36.4 C) 98.3 F (36.8 C)  98.6 F (37 C)  TempSrc: Oral Oral  Oral  SpO2: 96% 96%  97%  Weight:      Height:        Intake/Output Summary (Last 24 hours) at 06/21/2023 1715 Last data filed at 06/21/2023 1000 Gross per 24 hour  Intake 240 ml  Output 1250 ml  Net -1010 ml   Filed Weights   06/05/23 1644 06/06/23 0205  Weight: 66.7 kg 61.5 kg   Body mass index is 23.27 kg/m.  Gen: 68 y.o. female in no apparent distress.  Nontoxic Pulm: Non-labored breathing.  Clear to auscultation bilaterally.  CV: Regular rate and rhythm. No murmur, rub, or gallop. No JVD GI: Abdomen soft, non-tender, non-distended, with normoactive bowel sounds. No organomegaly or masses felt. Ext: Warm, no deformities, no pedal edema Skin: No rashes, lesions  Neuro: Alert and oriented. No focal neurological deficits. Psych: Calm  Judgement and insight appear normal. Mood & affect appropriate.     I have personally reviewed the following labs and images: CBC: Recent Labs  Lab 06/15/23 0711 06/21/23 0923  WBC 5.8 5.8  HGB 12.2 11.2*  HCT 36.4 34.4*  MCV 94.1 95.0  PLT 79* 69*   BMP &GFR Recent Labs  Lab 06/17/23 0533 06/21/23 0923  NA 134* 135  K 3.3* 5.4*  CL 104 106  CO2 23 23  GLUCOSE 114* 150*  BUN 15 18  CREATININE 0.64 0.61  CALCIUM 7.3* 8.1*   Estimated Creatinine Clearance: 57.3 mL/min (by C-G formula based on SCr of 0.61 mg/dL). Liver &  Pancreas: Recent Labs  Lab 06/17/23 0533  AST 58*  ALT 97*  ALKPHOS 49  BILITOT 0.5  PROT 5.2*  ALBUMIN 2.7*   No results for input(s): LIPASE, AMYLASE in the last 168 hours. No results for input(s): AMMONIA in the last 168 hours. Diabetic: No results for input(s): HGBA1C in the last 72 hours. No results for input(s): GLUCAP in the last 168 hours. Cardiac Enzymes: No results for input(s): CKTOTAL, CKMB, CKMBINDEX, TROPONINI in the last 168 hours. No results for input(s): PROBNP in the last 8760 hours. Coagulation Profile: No results for input(s): INR, PROTIME in the last 168 hours. Thyroid Function Tests: No results for input(s): TSH, T4TOTAL, FREET4, T3FREE, THYROIDAB in the last 72 hours. Lipid Profile: No results for input(s): CHOL, HDL, LDLCALC, TRIG, CHOLHDL, LDLDIRECT in the last 72 hours. Anemia Panel: No results for input(s): VITAMINB12, FOLATE, FERRITIN, TIBC, IRON, RETICCTPCT in the last 72 hours. Urine analysis:    Component Value Date/Time   COLORURINE YELLOW 06/05/2023 2154   APPEARANCEUR CLEAR 06/05/2023 2154   LABSPEC 1.006 06/05/2023 2154   PHURINE 5.0 06/05/2023 2154   GLUCOSEU NEGATIVE 06/05/2023 2154   HGBUR NEGATIVE 06/05/2023 2154   BILIRUBINUR NEGATIVE 06/05/2023 2154   BILIRUBINUR small 06/04/2013 1648  KETONESUR NEGATIVE 06/05/2023 2154   PROTEINUR NEGATIVE 06/05/2023 2154   UROBILINOGEN 0.2 06/04/2013 1648   NITRITE NEGATIVE 06/05/2023 2154   LEUKOCYTESUR NEGATIVE 06/05/2023 2154   Sepsis Labs: Invalid input(s): PROCALCITONIN, LACTICIDVEN  Microbiology: No results found for this or any previous visit (from the past 240 hours).  Radiology Studies: No results found.  Scheduled Meds:  amLODipine   5 mg Oral Daily   cyclobenzaprine   5 mg Oral TID   dexamethasone  (DECADRON ) injection  8 mg Intravenous Q24H   feeding supplement  237 mL Oral BID BM   letrozole   2.5 mg Oral Daily    oxyCODONE   15 mg Oral Q12H   pantoprazole   40 mg Oral Daily   polyethylene glycol  17 g Oral BID   senna-docusate  1 tablet Oral BID   Continuous Infusions:  promethazine  (PHENERGAN ) injection (IM or IVPB) 150 mL/hr at 06/19/23 0309     LOS: 15 days   25 minutes with more than 50% spent in reviewing records, counseling patient/family and coordinating care.  Reyes VEAR Gaw, MD [facility] [email or web address] 06/21/2023, 5:15 PM

## 2023-06-22 DIAGNOSIS — G8929 Other chronic pain: Secondary | ICD-10-CM | POA: Diagnosis not present

## 2023-06-22 DIAGNOSIS — Z170 Estrogen receptor positive status [ER+]: Secondary | ICD-10-CM | POA: Diagnosis not present

## 2023-06-22 DIAGNOSIS — C50412 Malignant neoplasm of upper-outer quadrant of left female breast: Secondary | ICD-10-CM | POA: Diagnosis not present

## 2023-06-22 DIAGNOSIS — M25552 Pain in left hip: Secondary | ICD-10-CM | POA: Diagnosis not present

## 2023-06-22 LAB — BASIC METABOLIC PANEL
Anion gap: 6 (ref 5–15)
BUN: 18 mg/dL (ref 8–23)
CO2: 22 mmol/L (ref 22–32)
Calcium: 7.9 mg/dL — ABNORMAL LOW (ref 8.9–10.3)
Chloride: 106 mmol/L (ref 98–111)
Creatinine, Ser: 0.55 mg/dL (ref 0.44–1.00)
GFR, Estimated: >60 mL/min (ref >60)
Glucose, Bld: 86 mg/dL (ref 70–99)
Potassium: 4.8 mmol/L (ref 3.5–5.1)
Sodium: 134 mmol/L — ABNORMAL LOW (ref 135–145)

## 2023-06-22 NOTE — Progress Notes (Signed)
 PROGRESS NOTE  Darlene Beasley  FMW:[id number] DOB: Jul 07, 1955 DOA: 06/05/2023 PCP: Delores Rojelio Caldron, NP  Consultants  Brief Narrative: Brief hospital course: 68 year old woman PMH breast cancer status post left lumpectomy, right mastectomy, chemotherapy, radiation, antiestrogen therapy, with newly diagnosed diffuse bone metastatic disease in November who presented with left hip pain and nausea and vomiting. Previously quite active but since November pain progressed to the point where she was mostly wheelchair-bound. Presented to the emergency department 12/26 from radiation oncology. Admitted for acute on chronic hip pain secondary to malignancy and metastatic disease. Completed radiation therapy during hospitalization. Was seen by oncology. Also seen by palliative medicine. High risk for right proximal femoral illogic fracture, acetabular fracture present on the left. Unable to ambulate safely. Pain limits ambulation anyway. Consulted orthopedics here, not a candidate for surgery at this facility. Orthopedics has been in touch with [facility], [facility] will call patient and family to discuss treatment options. Patient would like to go home eventually, we will go ahead and planning to maximize home health.    Assessment and Plan: * Hip pain - *Evidently, [facility] has not contacted patient.  We've not been able to contact them from the hospitalist team.  Patient would prefer inpt-to-inpt transfer to be evaluated rather than go home, though of course she would like go  home eventually as noted above.   -Secondary to significant diffuse and extensive pelvic bone and hip metastasis. S/p left iliac bone biopsy 12/24 and awaiting results.  Pain today mostly to the left hip for which she is currently receiving palliative radiation since last week. - on both short/long-acting oxycodone  -IV morphine  for breakthrough pain -scheduled bowel regimen -Judicious use of opioids as patient had acute hypoxia  following IV Dilaudid  administration.  She also is hesitant in taking high-dose opioids especially fentanyl  because her son passed away from drug overdose 2 years ago. - will try [facility] at number listed in chart again tomorrow as this is office number   Cancer of overlapping sites of right female breast [facility]) -breast cancer s/p left lumpectomy, right mastectomy, chemotherapy, radiation on antiestrogen therapy -Continue tamoxifen    HTN (hypertension) -continue amlodipine   Hyperkalemia, mild: - new problem today. -Minimally elevated.  No chest pain or symptoms. -Will recheck in the a.m.   Bilateral leg edema -symptoms started the same timeframe as her nausea, dry heaves in the setting of new palliative radiation -no other symptoms to suggest cardiac etiology. Lower likelihood of bilateral DVT.  -will arise elevation of LE. Unfortunately not able to dose Lasix  with AKI and dehydration   Acute hypoxic respiratory failure (HCC) -secondary to administrating of IV opioid for pain -Continue to monitor and wean as tolerated with goal O2 greater than 92% -Judicious use of  opioids for pain with close monitoring   Nausea & vomiting -better today  -PRN antiemetic.    AKI (acute kidney injury) (HCC) Pre-renal due to decrease oral intake - resolved   Hypercalcemia -corrected Ca of 11.8 on admission, improved to  -Will hydrate overnight and follow trend in the morning. If it remains elevated, will need calcitonin and bisphosphonate.   Hyponatremia - Secondary to decreased oral intake - improved, now borderline low again         DVT prophylaxis:  Place and maintain sequential compression device Start: 06/13/23 1449  Code Status:   Code Status: Limited: Do not attempt resuscitation (DNR) -DNR-LIMITED -Do Not Intubate/DNI  Level of care: Telemetry Status is: Inpatient Remains inpatient appropriate because:  Unable to transfer to [facility]   Subjective: Patient remains feeling fairly  well.  No concerns or complaints today.  Feels that her confusion from before has resolved.  Desired to have a shower yesterday but was unable to get out of bed due to pain.  Objective: Vitals:   06/21/23 1504 06/21/23 2126 06/22/23 0503 06/22/23 1359  BP: 118/69 133/69 129/80 110/64  Pulse: 94 88 95 89  Resp: 15 18 16 16   Temp: 98.6 F (37 C) 98.4 F (36.9 C) 97.6 F (36.4 C) 98.7 F (37.1 C)  TempSrc: Oral Oral Oral Oral  SpO2: 97% 95% 95% 92%  Weight:      Height:        Intake/Output Summary (Last 24 hours) at 06/22/2023 1442 Last data filed at 06/22/2023 0115 Gross per 24 hour  Intake 240 ml  Output 1450 ml  Net -1210 ml   Filed Weights   06/05/23 1644 06/06/23 0205  Weight: 66.7 kg 61.5 kg   Body mass index is 23.27 kg/m.  Gen: 68 y.o. female in no apparent distress.  Nontoxic Pulm: Non-labored breathing.  Clear to auscultation bilaterally.  CV: Regular rate and rhythm. No murmur, rub, or gallop. No JVD GI: Abdomen soft, non-tender, non-distended, with normoactive bowel sounds. No organomegaly or masses felt. Ext: Warm, no deformities, no pedal edema Skin: No rashes, lesions  Neuro: Alert and oriented. No focal neurological deficits. Psych: Calm  Judgement and insight appear normal. Mood & affect appropriate.     I have personally reviewed the following labs and images: CBC: Recent Labs  Lab 06/21/23 0923  WBC 5.8  HGB 11.2*  HCT 34.4*  MCV 95.0  PLT 69*   BMP &GFR Recent Labs  Lab 06/17/23 0533 06/21/23 0923 06/22/23 0651  NA 134* 135 134*  K 3.3* 5.4* 4.8  CL 104 106 106  CO2 23 23 22   GLUCOSE 114* 150* 86  BUN 15 18 18   CREATININE 0.64 0.61 0.55  CALCIUM 7.3* 8.1* 7.9*   Estimated Creatinine Clearance: 57.3 mL/min (by C-G formula based on SCr of 0.55 mg/dL). Liver & Pancreas: Recent Labs  Lab 06/17/23 0533  AST 58*  ALT 97*  ALKPHOS 49  BILITOT 0.5  PROT 5.2*  ALBUMIN 2.7*   No results for input(s): LIPASE, AMYLASE in the  last 168 hours. No results for input(s): AMMONIA in the last 168 hours. Diabetic: No results for input(s): HGBA1C in the last 72 hours. No results for input(s): GLUCAP in the last 168 hours. Cardiac Enzymes: No results for input(s): CKTOTAL, CKMB, CKMBINDEX, TROPONINI in the last 168 hours. No results for input(s): PROBNP in the last 8760 hours. Coagulation Profile: No results for input(s): INR, PROTIME in the last 168 hours. Thyroid Function Tests: No results for input(s): TSH, T4TOTAL, FREET4, T3FREE, THYROIDAB in the last 72 hours. Lipid Profile: No results for input(s): CHOL, HDL, LDLCALC, TRIG, CHOLHDL, LDLDIRECT in the last 72 hours. Anemia Panel: No results for input(s): VITAMINB12, FOLATE, FERRITIN, TIBC, IRON, RETICCTPCT in the last 72 hours. Urine analysis:    Component Value Date/Time   COLORURINE YELLOW 06/05/2023 2154   APPEARANCEUR CLEAR 06/05/2023 2154   LABSPEC 1.006 06/05/2023 2154   PHURINE 5.0 06/05/2023 2154   GLUCOSEU NEGATIVE 06/05/2023 2154   HGBUR NEGATIVE 06/05/2023 2154   BILIRUBINUR NEGATIVE 06/05/2023 2154   BILIRUBINUR small 06/04/2013 1648   KETONESUR NEGATIVE 06/05/2023 2154   PROTEINUR NEGATIVE 06/05/2023 2154   UROBILINOGEN 0.2 06/04/2013 1648   NITRITE NEGATIVE 06/05/2023  2154   LEUKOCYTESUR NEGATIVE 06/05/2023 2154   Sepsis Labs: Invalid input(s): PROCALCITONIN, LACTICIDVEN  Microbiology: No results found for this or any previous visit (from the past 240 hours).  Radiology Studies: No results found.  Scheduled Meds:  amLODipine   5 mg Oral Daily   cyclobenzaprine   5 mg Oral TID   dexamethasone  (DECADRON ) injection  8 mg Intravenous Q24H   feeding supplement  237 mL Oral BID BM   letrozole   2.5 mg Oral Daily   oxyCODONE   15 mg Oral Q12H   pantoprazole   40 mg Oral Daily   polyethylene glycol  17 g Oral BID   senna-docusate  1 tablet Oral BID   Continuous Infusions:   promethazine  (PHENERGAN ) injection (IM or IVPB) 150 mL/hr at 06/19/23 0309     LOS: 16 days   25 minutes with more than 50% spent in reviewing records, counseling patient/family and coordinating care.  Reyes VEAR Gaw, MD [facility] [email or web address] 06/22/2023, 2:42 PM

## 2023-06-22 NOTE — Plan of Care (Signed)

## 2023-06-23 DIAGNOSIS — M25552 Pain in left hip: Secondary | ICD-10-CM | POA: Diagnosis not present

## 2023-06-23 DIAGNOSIS — G8929 Other chronic pain: Secondary | ICD-10-CM

## 2023-06-23 DIAGNOSIS — C50412 Malignant neoplasm of upper-outer quadrant of left female breast: Secondary | ICD-10-CM | POA: Diagnosis not present

## 2023-06-23 DIAGNOSIS — Z170 Estrogen receptor positive status [ER+]: Secondary | ICD-10-CM | POA: Diagnosis not present

## 2023-06-23 MED ORDER — DEXAMETHASONE SODIUM PHOSPHATE 10 MG/ML IJ SOLN
6.0000 mg | INTRAMUSCULAR | Status: DC
  Administered 2023-06-23 – 2023-06-29 (×7): 6 mg via INTRAVENOUS
  Filled 2023-06-23 (×7): qty 1

## 2023-06-23 NOTE — Progress Notes (Signed)
 Darlene Beasley   DOB:10-14-1955   FM#:[id number]   [id number]  Medical oncology follow-up note  Subjective: Patient is clinically stable, still has significant pain when she change position, still waiting for [facility] orthopedic surgeon's decision on surgery.  She is tolerating letrozole  very well.   Objective:  Vitals:   06/23/23 0415 06/23/23 1427  BP: 109/76 112/70  Pulse: 85 89  Resp: 18 20  Temp: 97.8 F (36.6 C) 98.6 F (37 C)  SpO2: 97% 96%    Body mass index is 23.27 kg/m.  Intake/Output Summary (Last 24 hours) at 06/23/2023 1707 Last data filed at 06/23/2023 1520 Gross per 24 hour  Intake --  Output 2300 ml  Net -2300 ml     Sclerae unicteric  Oropharynx clear  No peripheral adenopathy  Lungs clear -- no rales or rhonchi  Heart regular rate and rhythm  Abdomen benign  MSK no focal spinal tenderness, no peripheral edema  Neuro nonfocal   CBG (last 3)  No results for input(s): GLUCAP in the last 72 hours.   Labs:  Lab Results  Component Value Date   WBC 5.8 06/21/2023   HGB 11.2 (L) 06/21/2023   HCT 34.4 (L) 06/21/2023   MCV 95.0 06/21/2023   PLT 69 (L) 06/21/2023   NEUTROABS 1.9 06/11/2023     Urine Studies No results for input(s): UHGB, CRYS in the last 72 hours.  Invalid input(s): UACOL, UAPR, USPG, UPH, UTP, UGL, UKET, UBIL, UNIT, UROB, ULEU, UEPI, UWBC, URBC, UBAC, CAST, [place], [place]  Basic Metabolic Panel: Recent Labs  Lab 06/17/23 0533 06/21/23 0923 06/22/23 0651  NA 134* 135 134*  K 3.3* 5.4* 4.8  CL 104 106 106  CO2 23 23 22   GLUCOSE 114* 150* 86  BUN 15 18 18   CREATININE 0.64 0.61 0.55  CALCIUM 7.3* 8.1* 7.9*   GFR Estimated Creatinine Clearance: 57.3 mL/min (by C-G formula based on SCr of 0.55 mg/dL). Liver Function Tests: Recent Labs  Lab 06/17/23 0533  AST 58*  ALT 97*  ALKPHOS 49  BILITOT 0.5  PROT 5.2*  ALBUMIN 2.7*   No results for input(s): LIPASE, AMYLASE in the last  168 hours. No results for input(s): AMMONIA in the last 168 hours. Coagulation profile No results for input(s): INR, PROTIME in the last 168 hours.  CBC: Recent Labs  Lab 06/21/23 0923  WBC 5.8  HGB 11.2*  HCT 34.4*  MCV 95.0  PLT 69*   Cardiac Enzymes: No results for input(s): CKTOTAL, CKMB, CKMBINDEX, TROPONINI in the last 168 hours. BNP: Invalid input(s): POCBNP CBG: No results for input(s): GLUCAP in the last 168 hours. D-Dimer No results for input(s): DDIMER in the last 72 hours. Hgb A1c No results for input(s): HGBA1C in the last 72 hours. Lipid Profile No results for input(s): CHOL, HDL, LDLCALC, TRIG, CHOLHDL, LDLDIRECT in the last 72 hours. Thyroid function studies No results for input(s): TSH, T4TOTAL, T3FREE, THYROIDAB in the last 72 hours.  Invalid input(s): FREET3 Anemia work up No results for input(s): VITAMINB12, FOLATE, FERRITIN, TIBC, IRON, RETICCTPCT in the last 72 hours. Microbiology No results found for this or any previous visit (from the past 240 hours).    Studies:  No results found.  Assessment: 68 y.o.  1.  Severe left hip pain secondary to bone metastasis and fracture  2.  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 Hypercalcemia secondary to malignancy, status post Zometa  on December 29 4. 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 Hypertension  Plan:  -Dr. Elpidio has again called [facility] orthopedic surgeon Dr. Wyline call back  -PT evaluation to see if she can do non-weight bearing PT -continue letrozole .  I plan to start her on Ibrance if she has no immediate surgery planned.  -I will continue f/u.     [name], MD 06/23/2023

## 2023-06-23 NOTE — Progress Notes (Addendum)
 PROGRESS NOTE  Darlene Beasley  FMW:[id number] DOB: [birth year] DOA: 06/05/2023 PCP: Darlene Rojelio Caldron, NP  Consultants  Brief Narrative: Brief hospital course: 68 year old woman PMH breast cancer status post left lumpectomy, right mastectomy, chemotherapy, radiation, antiestrogen therapy, with newly diagnosed diffuse bone metastatic disease in November who presented with left hip pain and nausea and vomiting. Previously quite active but since November pain progressed to the point where she was mostly wheelchair-bound. Presented to the emergency department 12/26 from radiation oncology. Admitted for acute on chronic hip pain secondary to malignancy and metastatic disease. Completed radiation therapy during hospitalization. Was seen by oncology. Also seen by palliative medicine. High risk for right proximal femoral illogic fracture, acetabular fracture present on the left. Unable to ambulate safely. Pain limits ambulation anyway. Consulted orthopedics here, not a candidate for surgery at this facility. [facility] with [facility], [facility] will call patient and family to discuss treatment options. Patient would like to go home eventually, we will go ahead and planning to maximize home health.    Assessment and Plan: * Hip pain -Pain control has been going well as long as patient is not moving. -I finally was able to speak to patient care coordinator down at [facility] today.  Called this morning and then when I had not heard from the surgeon I called again this afternoon. -Dr. Ballard evidently will be in clinic tomorrow and his nurse is aware of patient.  Plans to talk with him tomorrow if he does not return phone call this evening. -The issue is that she has complex pelvic fractures and diffuse metastatic disease throughout her pelvis as well as other bony mets including her low back/spine. -There is a question of whether this will be able to be surgically repaired.  If so patient desires  transfer to [facility]. -If surgery is not an option we will then have another thorough discussion as far as palliative care and next steps.  Patient's strength has greatly waned and I do not think she would do well at home.   Cancer of overlapping sites of right female breast [facility]) -breast cancer s/p left lumpectomy, right mastectomy, chemotherapy, radiation on antiestrogen therapy -Continue tamoxifen    HTN (hypertension) -continue amlodipine   Hyperkalemia, mild: - new problem today. -Minimally elevated.  No chest pain or symptoms. -Will recheck in the a.m.   Bilateral leg edema -Resolved   Acute hypoxic respiratory failure (HCC) -secondary to administrating of IV opioid for pain -Continue to monitor and wean as tolerated with goal O2 greater than 92% -Judicious use of  opioids for pain with close monitoring   Nausea & vomiting -better today  -PRN antiemetic.    AKI (acute kidney injury) (HCC) Pre-renal due to decrease oral intake - resolved   Hypercalcemia -corrected Ca of 11.8 on admission, improved to  -Will hydrate overnight and follow trend in the morning. If it remains elevated, will need calcitonin and bisphosphonate.   Hyponatremia - Secondary to decreased oral intake - improved, now borderline low again         DVT prophylaxis:  Place and maintain sequential compression device Start: 06/13/23 1449  Code Status:   Code Status: Limited: Do not attempt resuscitation (DNR) -DNR-LIMITED -Do Not Intubate/DNI  Level of care: Telemetry Status is: Inpatient Family discussion: Patient's longstanding boyfriend Darlene Beasley present at bedside and discussed plan with patient and him Remains inpatient appropriate because: Unable to transfer to [facility] at this time.   Subjective: Patient remains feeling fairly well,  although she and her longstanding boyfriend are both frustrated with how long they have had to be in the hospital.  Her pain is well-controlled as long she does not  move.  She is nervous about trying to get out of bed with any therapies due to the pain she has in her left hip and is also afraid that her left hip will collapse if she does try to walk on that hip.  Reports that other physicians have told her this.  Objective: Vitals:   06/22/23 1359 06/22/23 2032 06/23/23 0415 06/23/23 1427  BP: 110/64 116/70 109/76 112/70  Pulse: 89 88 85 89  Resp: 16 18 18 20   Temp: 98.7 F (37.1 C) 98.1 F (36.7 C) 97.8 F (36.6 C) 98.6 F (37 C)  TempSrc: Oral Oral Oral Oral  SpO2: 92% 96% 97% 96%  Weight:      Height:        Intake/Output Summary (Last 24 hours) at 06/23/2023 1709 Last data filed at 06/23/2023 1520 Gross per 24 hour  Intake --  Output 2300 ml  Net -2300 ml   Filed Weights   06/05/23 1644 06/06/23 0205  Weight: 66.7 kg 61.5 kg   Body mass index is 23.27 kg/m.  Gen: 68 y.o. female in no apparent distress.  Nontoxic Pulm: Non-labored breathing.  Clear to auscultation bilaterally.  CV: Regular rate and rhythm. No murmur, rub, or gallop. No JVD GI: Abdomen soft, non-tender, non-distended, with normoactive bowel sounds. No organomegaly or masses felt. Ext: Warm, no deformities, no pedal edema Skin: No rashes, lesions  Neuro: Alert and oriented. No focal neurological deficits.  Able to move legs and feet and toes Psych: Calm  Judgement and insight appear normal. Mood & affect appropriate.     I have personally reviewed the following labs and images: CBC: Recent Labs  Lab 06/21/23 0923  WBC 5.8  HGB 11.2*  HCT 34.4*  MCV 95.0  PLT 69*   BMP &GFR Recent Labs  Lab 06/17/23 0533 06/21/23 0923 06/22/23 0651  NA 134* 135 134*  K 3.3* 5.4* 4.8  CL 104 106 106  CO2 23 23 22   GLUCOSE 114* 150* 86  BUN 15 18 18   CREATININE 0.64 0.61 0.55  CALCIUM 7.3* 8.1* 7.9*   Estimated Creatinine Clearance: 57.3 mL/min (by C-G formula based on SCr of 0.55 mg/dL). Liver & Pancreas: Recent Labs  Lab 06/17/23 0533  AST 58*  ALT 97*   ALKPHOS 49  BILITOT 0.5  PROT 5.2*  ALBUMIN 2.7*   No results for input(s): LIPASE, AMYLASE in the last 168 hours. No results for input(s): AMMONIA in the last 168 hours. Diabetic: No results for input(s): HGBA1C in the last 72 hours. No results for input(s): GLUCAP in the last 168 hours. Cardiac Enzymes: No results for input(s): CKTOTAL, CKMB, CKMBINDEX, TROPONINI in the last 168 hours. No results for input(s): PROBNP in the last 8760 hours. Coagulation Profile: No results for input(s): INR, PROTIME in the last 168 hours. Thyroid Function Tests: No results for input(s): TSH, T4TOTAL, FREET4, T3FREE, THYROIDAB in the last 72 hours. Lipid Profile: No results for input(s): CHOL, HDL, LDLCALC, TRIG, CHOLHDL, LDLDIRECT in the last 72 hours. Anemia Panel: No results for input(s): VITAMINB12, FOLATE, FERRITIN, TIBC, IRON, RETICCTPCT in the last 72 hours. Urine analysis:    Component Value Date/Time   COLORURINE YELLOW 06/05/2023 2154   APPEARANCEUR CLEAR 06/05/2023 2154   LABSPEC 1.006 06/05/2023 2154   PHURINE 5.0 06/05/2023 2154  GLUCOSEU NEGATIVE 06/05/2023 2154   HGBUR NEGATIVE 06/05/2023 2154   BILIRUBINUR NEGATIVE 06/05/2023 2154   BILIRUBINUR small 06/04/2013 1648   KETONESUR NEGATIVE 06/05/2023 2154   PROTEINUR NEGATIVE 06/05/2023 2154   UROBILINOGEN 0.2 06/04/2013 1648   NITRITE NEGATIVE 06/05/2023 2154   LEUKOCYTESUR NEGATIVE 06/05/2023 2154   Sepsis Labs: Invalid input(s): PROCALCITONIN, LACTICIDVEN  Microbiology: No results found for this or any previous visit (from the past 240 hours).  Radiology Studies: No results found.  Scheduled Meds:  amLODipine   5 mg Oral Daily   cyclobenzaprine   5 mg Oral TID   dexamethasone  (DECADRON ) injection  6 mg Intravenous Q24H   feeding supplement  237 mL Oral BID BM   letrozole   2.5 mg Oral Daily   oxyCODONE   15 mg Oral Q12H   pantoprazole   40 mg Oral Daily    polyethylene glycol  17 g Oral BID   senna-docusate  1 tablet Oral BID   Continuous Infusions:  promethazine  (PHENERGAN ) injection (IM or IVPB) 150 mL/hr at 06/19/23 0309     LOS: 17 days   25 minutes with more than 50% spent in reviewing records, counseling patient/family and coordinating care.  Reyes VEAR Gaw, MD [facility] [email or web address] 06/23/2023, 5:09 PM

## 2023-06-23 NOTE — Plan of Care (Signed)

## 2023-06-24 ENCOUNTER — Other Ambulatory Visit (HOSPITAL_COMMUNITY): Payer: Self-pay

## 2023-06-24 ENCOUNTER — Encounter: Payer: Self-pay | Admitting: Hematology

## 2023-06-24 ENCOUNTER — Telehealth: Payer: Self-pay | Admitting: Pharmacy Technician

## 2023-06-24 DIAGNOSIS — M25552 Pain in left hip: Secondary | ICD-10-CM | POA: Diagnosis not present

## 2023-06-24 DIAGNOSIS — C50412 Malignant neoplasm of upper-outer quadrant of left female breast: Secondary | ICD-10-CM | POA: Diagnosis not present

## 2023-06-24 DIAGNOSIS — M84454A Pathological fracture, pelvis, initial encounter for fracture: Secondary | ICD-10-CM | POA: Diagnosis not present

## 2023-06-24 DIAGNOSIS — G8929 Other chronic pain: Secondary | ICD-10-CM | POA: Diagnosis not present

## 2023-06-24 LAB — BASIC METABOLIC PANEL
Anion gap: 8 (ref 5–15)
Anion gap: 3 — ABNORMAL LOW (ref 5–15)
BUN: 21 mg/dL (ref 8–23)
BUN: 20 mg/dL (ref 8–23)
CO2: 21 mmol/L — ABNORMAL LOW (ref 22–32)
CO2: 21 mmol/L — ABNORMAL LOW (ref 22–32)
Calcium: 8.5 mg/dL — ABNORMAL LOW (ref 8.9–10.3)
Calcium: 8 mg/dL — ABNORMAL LOW (ref 8.9–10.3)
Chloride: 108 mmol/L (ref 98–111)
Chloride: 106 mmol/L (ref 98–111)
Creatinine, Ser: 0.55 mg/dL (ref 0.44–1.00)
Creatinine, Ser: 0.55 mg/dL (ref 0.44–1.00)
GFR, Estimated: >60 mL/min (ref >60)
GFR, Estimated: >60 mL/min (ref >60)
Glucose, Bld: 86 mg/dL (ref 70–99)
Glucose, Bld: 98 mg/dL (ref 70–99)
Potassium: 6 mmol/L — ABNORMAL HIGH (ref 3.5–5.1)
Potassium: 4.1 mmol/L (ref 3.5–5.1)
Sodium: 137 mmol/L (ref 135–145)
Sodium: 130 mmol/L — ABNORMAL LOW (ref 135–145)

## 2023-06-24 LAB — CBC
HCT: 33.3 % — ABNORMAL LOW (ref 36.0–46.0)
Hemoglobin: 11.1 g/dL — ABNORMAL LOW (ref 12.0–15.0)
MCH: 31.2 pg (ref 26.0–34.0)
MCHC: 33.3 g/dL (ref 30.0–36.0)
MCV: 93.5 fL (ref 80.0–100.0)
Platelets: 79 10*3/uL — ABNORMAL LOW (ref 150–400)
RBC: 3.56 MIL/uL — ABNORMAL LOW (ref 3.87–5.11)
RDW: 14.8 % (ref 11.5–15.5)
WBC: 6 10*3/uL (ref 4.0–10.5)
nRBC: 0 % (ref 0.0–0.2)

## 2023-06-24 MED ORDER — FUROSEMIDE 10 MG/ML IJ SOLN
40.0000 mg | Freq: Once | INTRAMUSCULAR | Status: DC
  Filled 2023-06-24: qty 4

## 2023-06-24 MED ORDER — SODIUM ZIRCONIUM CYCLOSILICATE 10 G PO PACK
10.0000 g | PACK | Freq: Once | ORAL | Status: AC
  Administered 2023-06-24: 10 g via ORAL
  Filled 2023-06-24: qty 1

## 2023-06-24 NOTE — Progress Notes (Signed)
 Palliative-   Received call from [place], patient's significant other's sister.  Plan made to meet with Zebedee Pagoda and [name] tomorrow at 2pm to discuss further goals of care and plan. Noted patient has been determined not to be candidate for surgery.   Cassondra Stain, AGNP-C Palliative Medicine  No charge

## 2023-06-24 NOTE — Progress Notes (Signed)
 OT Cancellation Note  Patient Details Name: Darlene Beasley MRN: [id number] DOB: 18-Mar-1956   Cancelled Treatment:    Reason Eval/Treat Not Completed: OT screened, no needs identified, will sign off Secure chat with MD regarding re order for OT, patients continued documented pain and refusal to move and wishes expressed during last OT evaluation on 1/1. Please see eval on 1/1 for recommendations. MD asking for therapy to hold off until speaking with ortho from [facility] and patient. OT to sign off until patient agrees for participation in therapy.  Geofm LEYLAND, MS Acute Rehabilitation Department Office# [phone]  06/24/2023, 6:52 AM

## 2023-06-24 NOTE — Progress Notes (Signed)
 PROGRESS NOTE  Darlene Beasley  FMW:[id number] DOB: 01/15/1956 DOA: 06/05/2023 PCP: Delores Rojelio Caldron, NP  Consultants  Brief Narrative: Brief hospital course: 68 year old woman PMH breast cancer status post left lumpectomy, right mastectomy, chemotherapy, radiation, antiestrogen therapy, with newly diagnosed diffuse bone metastatic disease in November who presented with left hip pain and nausea and vomiting. Previously quite active but since November pain progressed to the point where she was mostly wheelchair-bound. Presented to the emergency department 12/26 from radiation oncology. Admitted for acute on chronic hip pain secondary to malignancy and metastatic disease. Completed radiation therapy during hospitalization. Was seen by oncology. Also seen by palliative medicine. High risk for right proximal femoral illogic fracture, acetabular fracture present on the left. Unable to ambulate safely. Pain limits ambulation anyway. Consulted orthopedics here, not a candidate for surgery at this facility. Per Onc-Surg at [facility], patient not a candidate for surgery.  Now desires home with palliative    Assessment and Plan: * Hip pain -Pain control has been going well as long as patient is not moving. - Per Dr. Ballard, patient is not a surgical candidate.  Recommending palliative care. -Discussed this with patient and she agrees.  Care consult placed today.  Continue pain control as we have been. -She mostly just wants to go home.  Does not want to go to rehab.   Cancer of overlapping sites of right female breast [facility]) -breast cancer s/p left lumpectomy, right mastectomy, chemotherapy, radiation on antiestrogen therapy -Continue tamoxifen .  Appreciate oncology input.   HTN (hypertension) -continue amlodipine   Hyperkalemia, mild: Elevated again today.  Recheck was 4.1.  Initially low, was ordered but held off due to recheck. Presume will moved to comfort care after palliative consult.  Blood checks.    Bilateral leg edema -Resolved   Acute hypoxic respiratory failure (HCC) -secondary to administrating of IV opioid for pain -Continue to monitor and wean as tolerated with goal O2 greater than 92% -Judicious use of  opioids for pain with close monitoring   Nausea & vomiting -better today  -PRN antiemetic.    AKI (acute kidney injury) (HCC) Pre-renal due to decrease oral intake - resolved   Hypercalcemia -corrected Ca of 11.8 on admission, improved to  -Will hydrate overnight and follow trend in the morning. If it remains elevated, will need calcitonin and bisphosphonate.   Hyponatremia - Secondary to inconsistent oral intake/hypovolemia.         DVT prophylaxis:  Place and maintain sequential compression device Start: 06/13/23 1449  Code Status:   Code Status: Limited: Do not attempt resuscitation (DNR) -DNR-LIMITED -Do Not Intubate/DNI  Level of care: Telemetry Status is: Inpatient Disposition: Palliative care consult placed to set up home hospice and discussed options.  Anticipate discharge next 1 to 2 days.   Subjective: Spoke with patient after hearing back from surgical oncology at [facility].  Information that she is not a surgical candidate and the palliative care was recommended.  Patient herself was disappointed but not surprised by the results.  Reports that she and her longstanding boyfriend have been preparing the house for her to return home including adding ramps, railings etc.  She has no complaints.  No pain as long as she is laying still in bed.  Eating and drinking well.  No nausea or vomiting.  Objective: Vitals:   06/23/23 1427 06/23/23 2030 06/24/23 0527 06/24/23 1339  BP: 112/70 119/73 119/81 119/69  Pulse: 89 88 88 95  Resp: 20 18 18 15   Temp: 98.6 F (  37 C) 98 F (36.7 C) 98.2 F (36.8 C) 98.3 F (36.8 C)  TempSrc: Oral Oral Oral Oral  SpO2: 96% 95% 96% 96%  Weight:      Height:        Intake/Output Summary (Last 24 hours) at 06/24/2023  1530 Last data filed at 06/24/2023 1356 Gross per 24 hour  Intake 714 ml  Output 850 ml  Net -136 ml   Filed Weights   06/05/23 1644 06/06/23 0205  Weight: 66.7 kg 61.5 kg   Body mass index is 23.27 kg/m.  Gen: 68 y.o. female in no apparent distress.  Nontoxic Pulm: Non-labored breathing.  Clear to auscultation bilaterally.  CV: Regular rate and rhythm. No murmur, rub, or gallop. No JVD GI: Abdomen soft, non-tender, non-distended, with normoactive bowel sounds. No organomegaly or masses felt. Ext: Warm, no deformities, no pedal edema Skin: No rashes, lesions  Neuro: Alert and oriented. No focal neurological deficits.  Able to move legs and feet and toes Psych: Calm  Judgement and insight appear normal. Mood & affect appropriate.     I have personally reviewed the following labs and images: CBC: Recent Labs  Lab 06/21/23 0923 06/24/23 0710  WBC 5.8 6.0  HGB 11.2* 11.1*  HCT 34.4* 33.3*  MCV 95.0 93.5  PLT 69* 79*   BMP &GFR Recent Labs  Lab 06/21/23 0923 06/22/23 0651 06/24/23 0538 06/24/23 1014  NA 135 134* 137 130*  K 5.4* 4.8 6.0* 4.1  CL 106 106 108 106  CO2 23 22 21* 21*  GLUCOSE 150* 86 86 98  BUN 18 18 21 20   CREATININE 0.61 0.55 0.55 0.55  CALCIUM 8.1* 7.9* 8.5* 8.0*   Estimated Creatinine Clearance: 57.3 mL/min (by C-G formula based on SCr of 0.55 mg/dL). Liver & Pancreas: No results for input(s): AST, ALT, ALKPHOS, BILITOT, PROT, ALBUMIN in the last 168 hours.  No results for input(s): LIPASE, AMYLASE in the last 168 hours. No results for input(s): AMMONIA in the last 168 hours. Diabetic: No results for input(s): HGBA1C in the last 72 hours. No results for input(s): GLUCAP in the last 168 hours. Cardiac Enzymes: No results for input(s): CKTOTAL, CKMB, CKMBINDEX, TROPONINI in the last 168 hours. No results for input(s): PROBNP in the last 8760 hours. Coagulation Profile: No results for input(s): INR, PROTIME  in the last 168 hours. Thyroid Function Tests: No results for input(s): TSH, T4TOTAL, FREET4, T3FREE, THYROIDAB in the last 72 hours. Lipid Profile: No results for input(s): CHOL, HDL, LDLCALC, TRIG, CHOLHDL, LDLDIRECT in the last 72 hours. Anemia Panel: No results for input(s): VITAMINB12, FOLATE, FERRITIN, TIBC, IRON, RETICCTPCT in the last 72 hours. Urine analysis:    Component Value Date/Time   COLORURINE YELLOW 06/05/2023 2154   APPEARANCEUR CLEAR 06/05/2023 2154   LABSPEC 1.006 06/05/2023 2154   PHURINE 5.0 06/05/2023 2154   GLUCOSEU NEGATIVE 06/05/2023 2154   HGBUR NEGATIVE 06/05/2023 2154   BILIRUBINUR NEGATIVE 06/05/2023 2154   BILIRUBINUR small 06/04/2013 1648   KETONESUR NEGATIVE 06/05/2023 2154   PROTEINUR NEGATIVE 06/05/2023 2154   UROBILINOGEN 0.2 06/04/2013 1648   NITRITE NEGATIVE 06/05/2023 2154   LEUKOCYTESUR NEGATIVE 06/05/2023 2154   Sepsis Labs: Invalid input(s): PROCALCITONIN, LACTICIDVEN  Microbiology: No results found for this or any previous visit (from the past 240 hours).  Radiology Studies: No results found.  Scheduled Meds:  amLODipine   5 mg Oral Daily   cyclobenzaprine   5 mg Oral TID   dexamethasone  (DECADRON ) injection  6 mg Intravenous Q24H  feeding supplement  237 mL Oral BID BM   furosemide   40 mg Intravenous Once   letrozole   2.5 mg Oral Daily   oxyCODONE   15 mg Oral Q12H   pantoprazole   40 mg Oral Daily   polyethylene glycol  17 g Oral BID   senna-docusate  1 tablet Oral BID   Continuous Infusions:  promethazine  (PHENERGAN ) injection (IM or IVPB) 150 mL/hr at 06/19/23 0309     LOS: 18 days   35 minutes with more than 50% spent in reviewing records, counseling patient/family and coordinating care.  Reyes VEAR Gaw, MD [facility] [email or web address] 06/24/2023, 3:30 PM

## 2023-06-24 NOTE — TOC Progression Note (Signed)
 Transition of Care [facility]) - Progression Note    Patient Details  Name: Darlene Beasley MRN: [id number] Date of Birth: 08-11-1955  Transition of Care [facility]) CM/SW Contact  Toy LITTIE Agar, RN Phone Number:[phone]  06/24/2023, 2:10 PM  Clinical Narrative:    TOC continues to follow. Currently plan per MD notes states that problem is complex pelvic fractures & diffuse metastatic disease with questionable surgical repair with request for [facility] is surgery is an option. Palliative last note on 1/8 supports hopeful for option of surgical intervention at [facility]. Patient open to Hospice when options exhausted . TOC following for any referrals or disposition needs. Currently there are no TOC needs.      Barriers to Discharge: Continued Medical Work up  Expected Discharge Plan and Services                                               Social Determinants of Health (SDOH) Interventions SDOH Screenings   Food Insecurity: Food Insecurity Present (05/28/2023)  Housing: High Risk (05/28/2023)  Transportation Needs: No Transportation Needs (05/28/2023)  Utilities: At Risk (05/28/2023)  Alcohol Screen: Low Risk  (05/10/2019)  Depression (PHQ2-9): Low Risk  (05/28/2023)  Physical Activity: Sufficiently Active (09/16/2017)  Social Connections: Socially Isolated (06/09/2023)  Stress: Stress Concern Present (09/16/2017)  Tobacco Use: High Risk (06/05/2023)    Readmission Risk Interventions    06/06/2023   11:23 PM  Readmission Risk Prevention Plan  Transportation Screening Complete  PCP or Specialist Appt within 5-7 Days Complete  Medication Review (RN CM) Referral to Pharmacy

## 2023-06-24 NOTE — Progress Notes (Addendum)
 PT Cancellation Note  Patient Details Name: Darlene Beasley MRN: [id number] DOB: 06-14-1955   Cancelled Treatment:    Reason Eval/Treat Not Completed: PT screened, no needs identified, will sign off Spoke with Patient who states that she cannot tolerate any mobility beyond rolling in bed and elevating  HOB. She plans to Dc home with DME/support.  No PT indicated at this time  Darice Potters PT Acute Rehabilitation Services Office [phone] Weekend pager-[phone]  Potters Darice Norris 06/24/2023, 2:28 PM

## 2023-06-24 NOTE — Plan of Care (Signed)
   Problem: Education: Goal: Knowledge of General Education information will improve Description Including pain rating scale, medication(s)/side effects and non-pharmacologic comfort measures Outcome: Progressing

## 2023-06-24 NOTE — Telephone Encounter (Signed)
 Oral Oncology Patient Advocate Encounter   Received notification that prior authorization for Darlene Beasley is required.   PA submitted on 06/24/23 Key [id number] Status is pending     Estefana Moellers, CPhT-Adv Oncology Pharmacy Patient Advocate [facility] Direct Number: [phone]  Fax: [phone]

## 2023-06-24 NOTE — Progress Notes (Signed)
 Darlene Beasley   DOB:January 13, 1956   FM#:[id number]   [id number]  Medical oncology follow-up note  Subjective: Patient is clinically stable, no pain when she lay still in bed. No other new complains, she would like to go home.    Objective:  Vitals:   06/24/23 0527 06/24/23 1339  BP: 119/81 119/69  Pulse: 88 95  Resp: 18 15  Temp: 98.2 F (36.8 C) 98.3 F (36.8 C)  SpO2: 96% 96%    Body mass index is 23.27 kg/m.  Intake/Output Summary (Last 24 hours) at 06/24/2023 1449 Last data filed at 06/24/2023 1356 Gross per 24 hour  Intake 714 ml  Output 1850 ml  Net -1136 ml     Sclerae unicteric  Oropharynx clear  No peripheral adenopathy  Lungs clear -- no rales or rhonchi  Heart regular rate and rhythm  Abdomen benign  MSK no focal spinal tenderness, no peripheral edema  Neuro nonfocal   CBG (last 3)  No results for input(s): GLUCAP in the last 72 hours.   Labs:  Lab Results  Component Value Date   WBC 6.0 06/24/2023   HGB 11.1 (L) 06/24/2023   HCT 33.3 (L) 06/24/2023   MCV 93.5 06/24/2023   PLT 79 (L) 06/24/2023   NEUTROABS 1.9 06/11/2023     Urine Studies No results for input(s): UHGB, CRYS in the last 72 hours.  Invalid input(s): UACOL, UAPR, USPG, UPH, UTP, UGL, UKET, UBIL, UNIT, UROB, [place], UEPI, UWBC, [place], [place]  Basic Metabolic Panel: Recent Labs  Lab 06/21/23 9076 06/22/23 0651 06/24/23 0538 06/24/23 1014  NA 135 134* 137 130*  K 5.4* 4.8 6.0* 4.1  CL 106 106 108 106  CO2 23 22 21* 21*  GLUCOSE 150* 86 86 98  BUN 18 18 21 20   CREATININE 0.61 0.55 0.55 0.55  CALCIUM 8.1* 7.9* 8.5* 8.0*   GFR Estimated Creatinine Clearance: 57.3 mL/min (by C-G formula based on SCr of 0.55 mg/dL). Liver Function Tests: No results for input(s): AST, ALT, ALKPHOS, BILITOT, PROT, ALBUMIN in the last 168 hours.  No results for input(s): LIPASE, AMYLASE in the last 168 hours. No results for  input(s): AMMONIA in the last 168 hours. Coagulation profile No results for input(s): INR, PROTIME in the last 168 hours.  CBC: Recent Labs  Lab 06/21/23 0923 06/24/23 0710  WBC 5.8 6.0  HGB 11.2* 11.1*  HCT 34.4* 33.3*  MCV 95.0 93.5  PLT 69* 79*   Cardiac Enzymes: No results for input(s): CKTOTAL, CKMB, CKMBINDEX, TROPONINI in the last 168 hours. BNP: Invalid input(s): POCBNP CBG: No results for input(s): GLUCAP in the last 168 hours. D-Dimer No results for input(s): DDIMER in the last 72 hours. Hgb A1c No results for input(s): HGBA1C in the last 72 hours. Lipid Profile No results for input(s): CHOL, HDL, LDLCALC, TRIG, CHOLHDL, LDLDIRECT in the last 72 hours. Thyroid function studies No results for input(s): TSH, T4TOTAL, T3FREE, THYROIDAB in the last 72 hours.  Invalid input(s): FREET3 Anemia work up No results for input(s): VITAMINB12, FOLATE, FERRITIN, TIBC, IRON, RETICCTPCT in the last 72 hours. Microbiology No results found for this or any previous visit (from the past 240 hours).    Studies:  No results found.  Assessment: 68 y.o.  1.  Severe left hip pain secondary to bone metastasis and fracture  2.  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 Hypercalcemia secondary to malignancy, status post Zometa  on December 29 4. 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 Hypertension  Plan:  -Dr. Elpidio has spoken with [facility] orthopedic surgeon Dr. Ballard and he does not recommend surgery  -Okay to discharge home with home physical therapy and Occupational Therapy, patient does not want to go to rehab.  She understands that she can do nonweight bearing exercise. -I will call in [facility] to [facility] pharmacy after discharge, she will start as soon as she receives it.  I discussed the benefit and potential side effect with her, she agrees to to try.  She will continue letrozole . -I will set up her follow-up in  my office in 3 to 4 weeks.    [name], MD 06/24/2023

## 2023-06-25 ENCOUNTER — Inpatient Hospital Stay: Payer: 59

## 2023-06-25 ENCOUNTER — Other Ambulatory Visit (HOSPITAL_COMMUNITY): Payer: Self-pay

## 2023-06-25 DIAGNOSIS — Z7189 Other specified counseling: Secondary | ICD-10-CM | POA: Diagnosis not present

## 2023-06-25 DIAGNOSIS — C7951 Secondary malignant neoplasm of bone: Secondary | ICD-10-CM | POA: Diagnosis not present

## 2023-06-25 DIAGNOSIS — M25552 Pain in left hip: Secondary | ICD-10-CM | POA: Diagnosis not present

## 2023-06-25 DIAGNOSIS — C50919 Malignant neoplasm of unspecified site of unspecified female breast: Secondary | ICD-10-CM | POA: Diagnosis not present

## 2023-06-25 DIAGNOSIS — G8929 Other chronic pain: Secondary | ICD-10-CM | POA: Diagnosis not present

## 2023-06-25 DIAGNOSIS — Z170 Estrogen receptor positive status [ER+]: Secondary | ICD-10-CM | POA: Diagnosis not present

## 2023-06-25 NOTE — TOC Progression Note (Signed)
 Transition of Care [facility]) - Progression Note    Patient Details  Name: Darlene Beasley MRN: [id number] Date of Birth: 11-Jan-1956  Transition of Care [facility]) CM/SW Contact  Loreda Rodriguez, RN Phone [phone]  06/25/2023, 3:37 PM  Clinical Narrative:    Hospital bed has been ordered per [employer].      Barriers to Discharge: Continued Medical Work up  Expected Discharge Plan and Services                                               Social Determinants of Health (SDOH) Interventions SDOH Screenings   Food Insecurity: Food Insecurity Present (05/28/2023)  Housing: High Risk (05/28/2023)  Transportation Needs: No Transportation Needs (05/28/2023)  Utilities: At Risk (05/28/2023)  Alcohol Screen: Low Risk  (05/10/2019)  Depression (PHQ2-9): Low Risk  (05/28/2023)  Physical Activity: Sufficiently Active (09/16/2017)  Social Connections: Socially Isolated (06/09/2023)  Stress: Stress Concern Present (09/16/2017)  Tobacco Use: High Risk (06/05/2023)    Readmission Risk Interventions    06/06/2023   11:23 PM  Readmission Risk Prevention Plan  Transportation Screening Complete  PCP or Specialist Appt within 5-7 Days Complete  Medication Review (RN CM) Referral to Pharmacy

## 2023-06-25 NOTE — Progress Notes (Signed)
 Daily Progress Note   Patient Name: Darlene Beasley       Date: 06/25/2023 DOB: 10/07/1955  Age: 68 y.o. MRN#: [id number] Attending Physician: Trenton Frock, MD Primary Care Physician: Carolyn Cisco, NP Admit Date: 06/05/2023  Reason for Consultation/Follow-up: Establishing goals of care  Patient Profile/HPI: 68 y.o. 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 Found to have pathologic fracture. Not a candidate for surgery. Palliative consulted for pain management and goals of care.     Subjective: Met at bedside with patient, her s/o Pascual Bonds, and [name]'s sister Ammon Bales.  Patient reports pain is better controlled.  Her goals of care are to continue to live as long as possible and "make sure everyone is taken care of". We discussed comfort vs ongoing life prolonging interventions. She notes she will continue to take any treatments for her cancer that Dr. Maryalice Smaller will offer. Noted plan to start Ibrance.  We discussed need for care at home- Pascual Bonds states he will assist her with personal care. They are requesting home physical therapy, hospital bed, walker, and any other equipment that PT recommends.  We discussed follow-up with Palliative at home and she is agreeable.   Review of Systems  Constitutional:  Positive for malaise/fatigue.  Gastrointestinal:  Positive for nausea.     Physical Exam Vitals and nursing note reviewed.  Constitutional:      Comments: frail  Neurological:     Mental Status: She is alert and oriented to person, place, and time.  Psychiatric:        Mood and Affect: Mood normal.             Vital Signs: BP 116/78 (BP  Location: Left Arm)   Pulse 98   Temp 98.6 F (37 C) (Oral)   Resp 20   Ht 5\' 4"  (1.626 m)   Wt 61.5 kg   SpO2 96%   BMI 23.27 kg/m  SpO2: SpO2: 96 % O2 Device: O2 Device: Room Air O2 Flow Rate: O2 Flow Rate (L/min): 2 L/min  Intake/output summary:  Intake/Output Summary (Last 24 hours) at 06/25/2023 1420 Last data filed at 06/25/2023 1321 Gross per 24 hour  Intake 957 ml  Output 2000 ml  Net -1043 ml   LBM: Last BM Date :  (UNKNOW) Baseline Weight: Weight: 66.7 kg Most recent weight: Weight: 61.5 kg       Palliative Assessment/Data: PPS: 50%      Patient Active Problem List   Diagnosis Date Noted   Pathological fracture, pelvis, initial encounter for fracture 06/14/2023   Chronic left hip pain 06/05/2023   Metastasis to bone (HCC) 06/05/2023   Hyponatremia 06/05/2023   Hypercalcemia 06/05/2023   AKI (acute kidney injury) (HCC) 06/05/2023   Nausea & vomiting 06/05/2023   Intractable pain 06/05/2023   Acute hypoxic respiratory failure (HCC) 06/05/2023   Bilateral leg edema 06/05/2023   HTN (hypertension) 06/05/2023   Genetic testing 11/14/2017   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   Adopted    Port-A-Cath in place 10/22/2017   Cancer of overlapping sites of right female breast (HCC) 10/08/2017   Malignant neoplasm of left breast in female, estrogen receptor positive (HCC) 10/08/2017    Palliative Care Assessment & Plan    Assessment/Recommendations/Plan  Stage IV breast ca, estrogen receptor positive, mets to bone with pathologic hip fracture- goc is to continue life prolonging treatments and live best possible quality- wishes to start Ibrance as recommended by Oncology Appreciate TOC assisting with home health and equipment needs   Code Status: DNR  Prognosis:  Unable to determine  Discharge Planning: Home with Home Health  Care plan was discussed with family and care team.   Thank you for allowing the Palliative Medicine Team to assist  in the care of this patient.  Total time:  Prolonged billing:  Time includes:   Preparing to see the patient (e.g., review of tests) Obtaining and/or reviewing separately obtained history Performing a medically necessary appropriate examination and/or evaluation Counseling and educating the patient/family/caregiver Ordering medications, tests, or procedures Referring and communicating with other health care professionals (when not reported separately) Documenting clinical information in the electronic or other health record Independently interpreting results (not reported separately) and communicating results to the patient/family/caregiver Care coordination (not reported separately) Clinical documentation  Micki Alas, AGNP-C Palliative Medicine   Please contact Palliative Medicine Team phone at [phone] for questions and concerns.

## 2023-06-25 NOTE — Plan of Care (Signed)
   Problem: Education: Goal: Knowledge of General Education information will improve Description Including pain rating scale, medication(s)/side effects and non-pharmacologic comfort measures Outcome: Progressing   Problem: Health Behavior/Discharge Planning: Goal: Ability to manage health-related needs will improve Outcome: Progressing

## 2023-06-25 NOTE — Telephone Encounter (Signed)
 Oral Oncology Patient Advocate Encounter  Prior Authorization for [facility] has been approved.    PA#  [id number] Effective dates: 06/24/23 through 06/09/24  Patients co-pay is $0.    Paulette Borrow, CPhT-Adv Oncology Pharmacy Patient Advocate [facility] Direct Number: [phone]  Fax: [phone]

## 2023-06-25 NOTE — Progress Notes (Signed)
 PROGRESS NOTE  Darlene Beasley  ZOX:[id number] DOB: 1955/09/09 DOA: 06/05/2023 PCP: Carolyn Cisco, NP  Consultants  Brief Narrative: Brief hospital course: 68 year old woman PMH breast cancer status post left lumpectomy, right mastectomy, chemotherapy, radiation, antiestrogen therapy, with newly diagnosed diffuse bone metastatic disease in November who presented with left hip pain and nausea and vomiting. Previously quite active but since November pain progressed to the point where she was mostly wheelchair-bound. Presented to the emergency department 12/26 from radiation oncology. Admitted for acute on chronic hip pain secondary to malignancy and metastatic disease. Completed radiation therapy during hospitalization. Was seen by oncology. Also seen by palliative medicine. High risk for right proximal femoral illogic fracture, acetabular fracture present on the left. Unable to ambulate safely. Pain limits ambulation anyway. Consulted orthopedics here, not a candidate for surgery at this facility. Per Onc-Surg at [facility], patient not a candidate for surgery.  Now desires home with palliative    Assessment and Plan: * Hip pain -Pain control has been going well as long as patient is not moving. - Per Dr. Lolly Riser, patient is not a surgical candidate.  Recommending palliative care. -Discussed this with patient and she agrees.  Care consult placed today.  Continue pain control as we have been. -She mostly just wants to go home.  Does not want to go to rehab. -Spoke with palliative care today.  Greatly appreciate palliative input. -Plan will be to discharge home tomorrow.  We will arrange with home physical therapy, hospital bed, walker, and any other equipment PT recommends.   Cancer of overlapping sites of right female breast [facility]) -breast cancer s/p left lumpectomy, right mastectomy, chemotherapy, radiation on antiestrogen therapy -Continue tamoxifen .  Appreciate oncology input.   HTN  (hypertension) -continue amlodipine   Hyperkalemia, mild: Elevated again today.  Recheck was 4.1.  Initially low, was ordered but held off due to recheck. Presume will moved to comfort care after palliative consult.  Blood checks.   Bilateral leg edema -Resolved   Acute hypoxic respiratory failure (HCC) -secondary to administrating of IV opioid for pain -Continue to monitor and wean as tolerated with goal O2 greater than 92% -Judicious use of  opioids for pain with close monitoring   Nausea & vomiting -better today  -PRN antiemetic.    AKI (acute kidney injury) (HCC) Pre-renal due to decrease oral intake - resolved   Hypercalcemia -corrected Ca of 11.8 on admission, improved to  -Will hydrate overnight and follow trend in the morning. If it remains elevated, will need calcitonin and bisphosphonate.   Hyponatremia - Secondary to inconsistent oral intake/hypovolemia.         DVT prophylaxis:  Place and maintain sequential compression device Start: 06/13/23 1449  Code Status:   Code Status: Limited: Do not attempt resuscitation (DNR) -DNR-LIMITED -Do Not Intubate/DNI  Level of care: Telemetry Status is: Inpatient Disposition: Discharge home tomorrow with home health PT and palliative follow-up.   Subjective: Patient feels little more tired today than yesterday.  Feels her pain is under control when she is not moving as per her usual.  She is resigned to not receiving surgery.  She would like to continue Ibrance per oncology results.  She is less hungry today than she has been.  No other concerns or complaints.  Objective: Vitals:   06/24/23 1339 06/24/23 2126 06/25/23 0453 06/25/23 1257  BP: 119/69 117/68 137/79 116/78  Pulse: 95 92 91 98  Resp: 15 18 18 20   Temp: 98.3 F (36.8 C) 97.9 F (36.6  C) 97.7 F (36.5 C) 98.6 F (37 C)  TempSrc: Oral Oral Oral Oral  SpO2: 96% 97% 97% 96%  Weight:      Height:        Intake/Output Summary (Last 24 hours) at 06/25/2023  1508 Last data filed at 06/25/2023 1321 Gross per 24 hour  Intake 957 ml  Output 2000 ml  Net -1043 ml   Filed Weights   06/05/23 1644 06/06/23 0205  Weight: 66.7 kg 61.5 kg   Body mass index is 23.27 kg/m.  Gen: 68 y.o. female in no apparent distress.  Nontoxic Pulm: Non-labored breathing.  Clear to auscultation bilaterally.  CV: Regular rate and rhythm. No murmur, rub, or gallop. No JVD GI: Abdomen soft, non-tender, non-distended, with normoactive bowel sounds. No organomegaly or masses felt. Ext: Warm, no deformities, no pedal edema Skin: No rashes, lesions  Neuro: Alert and oriented. No focal neurological deficits.  Able to move legs and feet and toes Psych: Calm  Judgement and insight appear normal. Mood & affect appropriate.     I have personally reviewed the following labs and images: CBC: Recent Labs  Lab 06/21/23 0923 06/24/23 0710  WBC 5.8 6.0  HGB 11.2* 11.1*  HCT 34.4* 33.3*  MCV 95.0 93.5  PLT 69* 79*   BMP &GFR Recent Labs  Lab 06/21/23 0923 06/22/23 0651 06/24/23 0538 06/24/23 1014  NA 135 134* 137 130*  K 5.4* 4.8 6.0* 4.1  CL 106 106 108 106  CO2 23 22 21* 21*  GLUCOSE 150* 86 86 98  BUN 18 18 21 20   CREATININE 0.61 0.55 0.55 0.55  CALCIUM 8.1* 7.9* 8.5* 8.0*   Estimated Creatinine Clearance: 57.3 mL/min (by C-G formula based on SCr of 0.55 mg/dL). Liver & Pancreas: No results for input(s): "AST", "ALT", "ALKPHOS", "BILITOT", "PROT", "ALBUMIN" in the last 168 hours.  No results for input(s): "LIPASE", "AMYLASE" in the last 168 hours. No results for input(s): "AMMONIA" in the last 168 hours. Diabetic: No results for input(s): "HGBA1C" in the last 72 hours. No results for input(s): "GLUCAP" in the last 168 hours. Cardiac Enzymes: No results for input(s): "CKTOTAL", "CKMB", "CKMBINDEX", "TROPONINI" in the last 168 hours. No results for input(s): "PROBNP" in the last 8760 hours. Coagulation Profile: No results for input(s): "INR", "PROTIME"  in the last 168 hours. Thyroid Function Tests: No results for input(s): "TSH", "T4TOTAL", "FREET4", "T3FREE", "THYROIDAB" in the last 72 hours. Lipid Profile: No results for input(s): "CHOL", "HDL", "LDLCALC", "TRIG", "CHOLHDL", "LDLDIRECT" in the last 72 hours. Anemia Panel: No results for input(s): "VITAMINB12", "FOLATE", "FERRITIN", "TIBC", "IRON", "RETICCTPCT" in the last 72 hours. Urine analysis:    Component Value Date/Time   COLORURINE YELLOW 06/05/2023 2154   APPEARANCEUR CLEAR 06/05/2023 2154   LABSPEC 1.006 06/05/2023 2154   PHURINE 5.0 06/05/2023 2154   GLUCOSEU NEGATIVE 06/05/2023 2154   HGBUR NEGATIVE 06/05/2023 2154   BILIRUBINUR NEGATIVE 06/05/2023 2154   BILIRUBINUR small 06/04/2013 1648   KETONESUR NEGATIVE 06/05/2023 2154   PROTEINUR NEGATIVE 06/05/2023 2154   UROBILINOGEN 0.2 06/04/2013 1648   NITRITE NEGATIVE 06/05/2023 2154   LEUKOCYTESUR NEGATIVE 06/05/2023 2154   Sepsis Labs: Invalid input(s): "PROCALCITONIN", "LACTICIDVEN"  Microbiology: No results found for this or any previous visit (from the past 240 hours).  Radiology Studies: No results found.  Scheduled Meds:  amLODipine   5 mg Oral Daily   cyclobenzaprine   5 mg Oral TID   dexamethasone  (DECADRON ) injection  6 mg Intravenous Q24H   feeding supplement  237 mL Oral BID BM   letrozole   2.5 mg Oral Daily   oxyCODONE   15 mg Oral Q12H   pantoprazole   40 mg Oral Daily   polyethylene glycol  17 g Oral BID   senna-docusate  1 tablet Oral BID   Continuous Infusions:  promethazine  (PHENERGAN ) injection (IM or IVPB) 150 mL/hr at 06/19/23 0309     LOS: 19 days   35 minutes with more than 50% spent in reviewing records, counseling patient/family and coordinating care.  Trenton Frock, MD [facility] [email or web address] 06/25/2023, 3:08 PM

## 2023-06-26 DIAGNOSIS — M84454A Pathological fracture, pelvis, initial encounter for fracture: Secondary | ICD-10-CM | POA: Diagnosis not present

## 2023-06-26 DIAGNOSIS — C50811 Malignant neoplasm of overlapping sites of right female breast: Secondary | ICD-10-CM | POA: Diagnosis not present

## 2023-06-26 DIAGNOSIS — G893 Neoplasm related pain (acute) (chronic): Secondary | ICD-10-CM | POA: Diagnosis not present

## 2023-06-26 DIAGNOSIS — Z170 Estrogen receptor positive status [ER+]: Secondary | ICD-10-CM | POA: Diagnosis not present

## 2023-06-26 DIAGNOSIS — C50412 Malignant neoplasm of upper-outer quadrant of left female breast: Secondary | ICD-10-CM | POA: Diagnosis not present

## 2023-06-26 DIAGNOSIS — C7951 Secondary malignant neoplasm of bone: Secondary | ICD-10-CM | POA: Diagnosis not present

## 2023-06-26 DIAGNOSIS — M25552 Pain in left hip: Secondary | ICD-10-CM | POA: Diagnosis not present

## 2023-06-26 MED ORDER — XTAMPZA ER 9 MG PO C12A: 9.0000 mg | EXTENDED_RELEASE_CAPSULE | Freq: Two times a day (BID) | ORAL | 0 refills | Status: AC

## 2023-06-26 MED ORDER — OXYCODONE HCL 5 MG PO TABS: 5.0000 mg | ORAL_TABLET | Freq: Four times a day (QID) | ORAL | 0 refills | Status: DC | PRN

## 2023-06-26 MED ORDER — PROMETHAZINE HCL 12.5 MG RE SUPP: 12.5000 mg | Freq: Four times a day (QID) | RECTAL | 0 refills | Status: DC | PRN

## 2023-06-26 MED ORDER — ACETAMINOPHEN 325 MG PO TABS: 650.0000 mg | ORAL_TABLET | Freq: Four times a day (QID) | ORAL | 0 refills | Status: DC | PRN

## 2023-06-26 MED ORDER — ENSURE ENLIVE PO LIQD: 237.0000 mL | Freq: Two times a day (BID) | ORAL | 12 refills | Status: DC

## 2023-06-26 MED ORDER — SENNOSIDES-DOCUSATE SODIUM 8.6-50 MG PO TABS: 1.0000 | ORAL_TABLET | Freq: Two times a day (BID) | ORAL | 0 refills | Status: DC | PRN

## 2023-06-26 MED ORDER — LETROZOLE 2.5 MG PO TABS: 2.5000 mg | ORAL_TABLET | Freq: Every day | ORAL | 0 refills | Status: DC

## 2023-06-26 MED ORDER — CYCLOBENZAPRINE HCL 5 MG PO TABS: 5.0000 mg | ORAL_TABLET | Freq: Three times a day (TID) | ORAL | 0 refills | Status: DC

## 2023-06-26 MED ORDER — ONDANSETRON HCL 8 MG PO TABS: 4.0000 mg | ORAL_TABLET | Freq: Four times a day (QID) | ORAL | 1 refills | Status: DC | PRN

## 2023-06-26 NOTE — Plan of Care (Signed)

## 2023-06-26 NOTE — [place] FL2 (Signed)
[place] MEDICAID FL2 LEVEL OF CARE FORM     IDENTIFICATION  Patient Name: Darlene Beasley Birthdate: [birth year] Sex: female Admission Date (Current Location): 06/05/2023  [facility] and [place] Number:  Producer, television/film/video and Address:  [facility],  [address] [place], [place] [address]      Provider Number: [id number]  Attending Physician Name and Address:  Tobey Grim, MD  Relative Name and Phone Number:  [employer] [phone]    Current Level of Care: Hospital Recommended Level of Care: Skilled Nursing Facility Prior Approval Number:    Date Approved/Denied:   PASRR Number: [id number]  Discharge Plan: SNF    Current Diagnoses: Patient Active Problem List   Diagnosis Date Noted   Pathological fracture, pelvis, initial encounter for fracture 06/14/2023   Chronic left hip pain 06/05/2023   Metastasis to bone (HCC) 06/05/2023   Hyponatremia 06/05/2023   Hypercalcemia 06/05/2023   AKI (acute kidney injury) (HCC) 06/05/2023   Nausea & vomiting 06/05/2023   Intractable pain 06/05/2023   Acute hypoxic respiratory failure (HCC) 06/05/2023   Bilateral leg edema 06/05/2023   HTN (hypertension) 06/05/2023   Genetic testing 11/14/2017   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   Adopted    Port-A-Cath in place 10/22/2017   Cancer of overlapping sites of right female breast (HCC) 10/08/2017   Malignant neoplasm of left breast in female, estrogen receptor positive (HCC) 10/08/2017    Orientation RESPIRATION BLADDER Height & Weight     Self, Time, Situation, Place  Normal Continent Weight: 61.5 kg Height:  5\' 4"  (162.6 cm)  BEHAVIORAL SYMPTOMS/MOOD NEUROLOGICAL BOWEL NUTRITION STATUS     (n/a) Continent Diet  AMBULATORY STATUS COMMUNICATION OF NEEDS Skin   Extensive Assist Verbally Normal                       Personal Care Assistance Level of Assistance  Bathing, Feeding, Dressing Bathing Assistance: Limited assistance Feeding  assistance: Limited assistance Dressing Assistance: Limited assistance     Functional Limitations Info  Sight, Hearing, Speech   Hearing Info: Adequate Speech Info: Adequate    SPECIAL CARE FACTORS FREQUENCY  PT (By licensed PT), OT (By licensed OT)     PT Frequency: 5x/wk OT Frequency: 5x/wk            Contractures Contractures Info: Not present    Additional Factors Info  Code Status, Allergies, Psychotropic, Insulin Sliding Scale, Isolation Precautions, Suctioning Needs Code Status Info: DNR Allergies Info: Bee Venom, Tramadol Psychotropic Info: see discharge summary Insulin Sliding Scale Info: see discharge summary Isolation Precautions Info: n/a Suctioning Needs: n/a   Current Medications (06/26/2023):  This is the current hospital active medication list Current Facility-Administered Medications  Medication Dose Route Frequency Provider Last Rate Last Admin   acetaminophen (TYLENOL) tablet 650 mg  650 mg Oral Q6H PRN Standley Brooking, MD       amLODipine (NORVASC) tablet 5 mg  5 mg Oral Daily Tu, Ching T, DO   5 mg at 06/26/23 1039   antiseptic oral rinse (BIOTENE) solution 15 mL  15 mL Mouth Rinse PRN Ulice Bold, NP   15 mL at 06/16/23 2158   cyclobenzaprine (FLEXERIL) tablet 5 mg  5 mg Oral TID Dorcas Carrow, MD   5 mg at 06/26/23 1039   dexamethasone (DECADRON) injection 6 mg  6 mg Intravenous Q24H Yong Channel C, NP   6 mg at 06/25/23 1420   feeding supplement (ENSURE  ENLIVE / ENSURE PLUS) liquid 237 mL  237 mL Oral BID BM Tu, Ching T, DO   237 mL at 06/26/23 1045   letrozole [facility]) tablet 2.5 mg  2.5 mg Oral Daily Malachy Mood, MD   2.5 mg at 06/26/23 1045   morphine (PF) 2 MG/ML injection 2 mg  2 mg Intravenous Q3H PRN Tu, Ching T, DO   2 mg at 06/12/23 1710   ondansetron (ZOFRAN) injection 4 mg  4 mg Intravenous Q6H PRN Tu, Ching T, DO   4 mg at 06/25/23 1332   oxyCODONE (Oxy IR/ROXICODONE) immediate release tablet 5 mg  5 mg Oral Q4H PRN Dorcas Carrow, MD   5 mg at 06/10/23 1823   oxyCODONE (OXYCONTIN) 12 hr tablet 15 mg  15 mg Oral Q12H Dorcas Carrow, MD   15 mg at 06/26/23 1039   pantoprazole (PROTONIX) EC tablet 40 mg  40 mg Oral Daily Yong Channel C, NP   40 mg at 06/26/23 1039   polyethylene glycol (MIRALAX / GLYCOLAX) packet 17 g  17 g Oral BID Standley Brooking, MD   17 g at 06/26/23 1039   promethazine (PHENERGAN) tablet 12.5 mg  12.5 mg Oral Q6H PRN Standley Brooking, MD       Or   promethazine (PHENERGAN) 12.5 mg in sodium chloride 0.9 % 50 mL IVPB  12.5 mg Intravenous Q6H PRN Standley Brooking, MD 150 mL/hr at 06/19/23 0309 Infusion Verify at 06/19/23 0309   Or   promethazine (PHENERGAN) suppository 12.5 mg  12.5 mg Rectal Q6H PRN Standley Brooking, MD   12.5 mg at 06/16/23 2952   senna-docusate (Senokot-S) tablet 1 tablet  1 tablet Oral BID Tu, Ching T, DO   1 tablet at 06/26/23 1039     Discharge Medications: Please see discharge summary for a list of discharge medications.  Relevant Imaging Results:  Relevant Lab Results:   Additional Information SS# [id number]  Beckie Busing, RN

## 2023-06-26 NOTE — TOC Progression Note (Addendum)
Transition of Care [facility]) - Progression Note    Patient Details  Name: Darlene Beasley MRN: [id number] Date of Birth: 02-28-1956  [facility]) CM/SW Contact  Beckie Busing, RN Phone [phone]  06/26/2023, 11:24 AM  Clinical Narrative:    CM at bedside to discuss disposition plan. Patient states that she will need hospital bed. Hospital bed has been ordered per [employer] and to be delivered to the home today. Patient states that she wants to continue palliative services at discharge. Patient offered choice for palliative services. Home palliative referral has been called to [facility]. Patientstates that she does not need any other DME for home. Patient states that she has a standard wheelchair at home but inquires if she will be able to get a smaller wheelchair. CM spoke with Jermaine at [facility] who confirms that standard wheelchair is what patient will qualify for per her weight and height. Patient also inquires about pure wick at home. CM made patient aware that insurance would not cover purewick.No other TOC needs noted.   1433 CM at bedside due to patient with some hesitance about going home  and transport home. CM at bedside. Patient is alert but tearful. Patient states that she really wanted to go home but she just realized while attempting to get ready for home that she is not able to care for herself. Patient and significant now agreeable to short term rehab.  1530 FL2 completed and faxed out for SNF bed offers      Barriers to Discharge: Continued Medical Work up  Expected Discharge Plan and Services                                               Social Determinants of Health (SDOH) Interventions SDOH Screenings   Food Insecurity: Food Insecurity Present (05/28/2023)  Housing: High Risk (05/28/2023)  Transportation Needs: No Transportation Needs (05/28/2023)  Utilities: At Risk (05/28/2023)  Alcohol Screen: Low Risk   (05/10/2019)  Depression (PHQ2-9): Low Risk  (05/28/2023)  Physical Activity: Sufficiently Active (09/16/2017)  Social Connections: Socially Isolated (06/09/2023)  Stress: Stress Concern Present (09/16/2017)  Tobacco Use: High Risk (06/05/2023)    Readmission Risk Interventions    06/06/2023   11:23 PM  Readmission Risk Prevention Plan  Transportation Screening Complete  PCP or Specialist Appt within 5-7 Days Complete  Medication Review (RN CM) Referral to Pharmacy

## 2023-06-26 NOTE — Discharge Summary (Incomplete)
Physician Discharge Summary   Patient: Darlene Beasley MRN: [id number] DOB: [birth year]  Admit date:     06/05/2023  Discharge date: {dischdate:26783}  Discharge Physician: Tobey Grim   PCP: Estevan Oaks, NP   Recommendations at discharge:  {Tip this will not be part of the note when signed- Example include specific recommendations for outpatient follow-up, pending tests to follow-up on. (Optional):26781} Take the long-acting pain reliever oxycontin twice daily.  You have the short-acting oxycodone to use as needed every 6 hours or so.  If you have any nausea or vomiting, zofran is available to help with this.   Make sure to follow-up with Dr. Latanya Maudlin office as well as your primary care.   I have ordered home health PT/OT/nursing to help with assistance at home.   Discharge Diagnoses: Principal Problem:   Chronic left hip pain Active Problems:   Cancer of overlapping sites of right female breast (HCC)   Metastasis to bone (HCC)   Hyponatremia   Hypercalcemia   AKI (acute kidney injury) (HCC)   Nausea & vomiting   Intractable pain   Acute hypoxic respiratory failure (HCC)   Bilateral leg edema   HTN (hypertension)   Pathological fracture, pelvis, initial encounter for fracture  Resolved Problems:   * No resolved hospital problems. [facility] Course: No notes on file  Assessment and Plan: * Chronic left hip pain -Secondary to significant diffuse and extensive pelvic bone and hip metastasis. S/p left iliac bone biopsy 12/24 and awaiting results.  Pain today mostly to the left hip for which she is currently receiving palliative radiation since last week. -Has been taking PRN oxycodone IR q4hr which we'll continue  -Will start oxycodone ER q12hr -IV morphine for breakthrough pain -scheduled bowel regimen -Judicious use of opioids as patient had acute hypoxia following IV Dilaudid administration.  She also is hesitant in taking high-dose opioids especially fentanyl  because her son passed away from drug overdose 2 years ago. -pelvic X-ray could not exclude pathologic fracture of the right hip due to diffuse metastasis.  This was also noted on recent CT pelvis earlier this month.  She reports discussion of prophylactic fixation of the right hip after radiation with oncology. Given that patient has no worsening pain on the right side will hold off on any further imaging.  Cancer of overlapping sites of right female breast [facility]) -breast cancer s/p left lumpectomy, right mastectomy, chemotherapy, radiation on antiestrogen therapy -Continue tamoxifen  HTN (hypertension) -continue amlodipine  Bilateral leg edema -symptoms started the same timeframe as her nausea, dry heaves in the setting of new palliative radiation -no other symptoms to suggest cardiac etiology. Lower likelihood of bilateral DVT.  -will arise elevation of LE. Unfortunately not able to dose Lasix with AKI and dehydration  Acute hypoxic respiratory failure (HCC) -secondary to administrating of IV opioid for pain -Continue to monitor and wean as tolerated with goal O2 greater than 92% -Judicious use of  opioids for pain with close monitoring  Nausea & vomiting -possibly anorexia from radiation therapy. LFTs and lipase are within normal limits. No fever or leukocytosis to suggest infection.  -PRN antiemetic.   AKI (acute kidney injury) (HCC) Creatinine elevated to 1.77. Pre-renal due to decrease oral intake -Keep on continuous IV fluids overnight  Hypercalcemia -corrected Ca of 11.8.  -Will hydrate overnight and follow trend in the morning. If it remains elevated, will need calcitonin and bisphosphonate.  Hyponatremia - Secondary to decreased oral intake - Keep on  continuous IV fluids overnight      {Tip this will not be part of the note when signed Body mass index is 23.27 kg/m. , ,  (Optional):26781}  {(NOTE) Pain control PDMP Statment (Optional):26782} Consultants:  *** Procedures performed: ***  Disposition: {Plan; Disposition:26390} Diet recommendation:  Discharge Diet Orders (From admission, onward)     Start     Ordered   06/26/23 0000  Diet general        06/26/23 1136           {Diet_Plan:26776} DISCHARGE MEDICATION: Allergies as of 06/26/2023       Reactions   Bee Venom Anaphylaxis, Swelling   Tramadol Swelling, Other (See Comments)   Throat swelling        Medication List     STOP taking these medications    tamoxifen 20 MG tablet Commonly known as: NOLVADEX       TAKE these medications    acetaminophen 325 MG tablet Commonly known as: TYLENOL Take 2 tablets (650 mg total) by mouth every 6 (six) hours as needed for mild pain (pain score 1-3).   amLODipine 5 MG tablet Commonly known as: NORVASC Take 1 tablet (5 mg total) by mouth daily.   cyclobenzaprine 5 MG tablet Commonly known as: FLEXERIL Take 1 tablet (5 mg total) by mouth 3 (three) times daily.   dexamethasone 4 MG tablet Commonly known as: DECADRON Take 1 tablet (4 mg total) by mouth 2 (two) times daily with a meal.   docusate sodium 50 MG capsule Commonly known as: COLACE Take 50 mg by mouth 2 (two) times daily.   feeding supplement Liqd Take 237 mLs by mouth 2 (two) times daily between meals.   letrozole 2.5 MG tablet Commonly known as: FEMARA Take 1 tablet (2.5 mg total) by mouth daily. Start taking on: June 27, 2023   ondansetron 8 MG tablet Commonly known as: ZOFRAN Take 0.5 tablets (4 mg total) by mouth every 6 (six) hours as needed for nausea or vomiting. What changed: when to take this   oxyCODONE 5 MG immediate release tablet Commonly known as: Oxy IR/ROXICODONE Take 1-2 tablets (5-10 mg total) by mouth every 6 (six) hours as needed for severe pain (pain score 7-10) or breakthrough pain. What changed: when to take this   prochlorperazine 10 MG tablet Commonly known as: COMPAZINE Take 1 tablet (10 mg total) by mouth every 6  (six) hours as needed for nausea or vomiting.   promethazine 12.5 MG suppository Commonly known as: PHENERGAN Place 1 suppository (12.5 mg total) rectally every 6 (six) hours as needed for nausea (If not controlled with zofran).   senna-docusate 8.6-50 MG tablet Commonly known as: Senokot-S Take 1 tablet by mouth 2 (two) times daily as needed for mild constipation.   Xtampza ER 9 MG C12a Generic drug: oxyCODONE ER Take 9 mg by mouth every 12 (twelve) hours for 5 days.               Durable Medical Equipment  (From admission, onward)           Start     Ordered   06/25/23 1535  For home use only DME Hospital bed  Once       Comments: Therapeutic mattress  Question Answer Comment  Length of Need 12 Months   Patient has (list medical condition): metastatic cancer chronic hip pain   The above medical condition requires: Patient requires the ability to reposition frequently   Head must  be elevated greater than: 30 degrees   Bed type Semi-electric      06/25/23 1536            Discharge Exam: Filed Weights   06/05/23 1644 06/06/23 0205  Weight: 66.7 kg 61.5 kg   ***  Condition at discharge: {DC Condition:26389}  The results of significant diagnostics from this hospitalization (including imaging, microbiology, ancillary and laboratory) are listed below for reference.   Imaging Studies: DG Bone Survey Met Result Date: 06/13/2023 CLINICAL DATA:  Metastasis of unknown primary. Diffuse skeletal metastases. EXAM: METASTATIC BONE SURVEY COMPARISON:  MRI lumbar spine 06/12/2023, CT chest, abdomen, and pelvis 05/16/2023; whole-body bone scan 05/07/2023 FINDINGS: Innumerable lytic lesions are again seen throughout the axial and appendicular skeleton. There are multiple lytic lesions within the skull, measuring up to 12 mm. 11 mm lytic lesion within the lateral left scapula just inferior to the glenoid. 28 mm lytic lesion within the lateral right scapula inferior to the  glenoid. Dominant approximate 14 mm lytic lesion within the proximal lateral aspect of the right humeral diaphysis. 13 mm lytic lesion within the proximal left humeral metadiaphysis and 13 mm lytic lesion within the mid left humeral diaphysis. Moderate anterior height loss of the C7 vertebral body. Lucency predominantly within the posterosuperior aspect of the C7 vertebral body, likely pathologic involvement resulting in pathologic compression fracture. Numerous lytic lesions throughout the lumbar spine, better seen on prior CT and MRI. Dominant right L1 transverse process destructive lesion causing mild scalloping of the right L1 vertebral body, better seen on prior CT where a "dumbbell-shaped lesion" extends into the right aspect of the central canal and through the right neural foramen on prior CT. Grade 1 anterolisthesis of L3 on L4 and L4 on L5. The known marked left-greater-than-right hemipelvis destructive lytic lesions are better seen on today's CT. Bowel-gas obscures portions of the bilateral iliac bones. Sclerotic lesion within the left intertrochanteric region extending to the left greater trochanter measuring up to approximately 3.6 cm in craniocaudal dimension. Adjacent slightly more inferolateral proximal left femoral 14 mm lucent lesion. Additional scattered small lucent lesions within the bilateral femoral diaphyses and right lateral tibial plateau. Bilateral axillary surgical clips. IMPRESSION: 1. Innumerable lytic lesions throughout the axial and appendicular skeleton, consistent with diffuse osseous metastatic disease. 2. Moderate anterior height loss of the C7 vertebral body, likely a pathologic compression fracture. 3. Dominant right L1 transverse process destructive lesion causing mild scalloping of the right L1 vertebral body, better seen on prior CT where a "dumbbell-shaped lesion" extends into the right aspect of the central canal and through the right neural foramen on prior CT. 4. Dominant  left greater than right hemipelvis lytic lesions are better seen on today's CT. 5. Sclerotic lesion within the left intertrochanteric region extending to the left greater trochanter measuring up to approximately 3.6 cm in craniocaudal dimension. Adjacent slightly more inferolateral proximal left femoral 14 mm lucent lesion. Electronically Signed   By: Neita Garnet M.D.   On: 06/13/2023 13:53   CT HIP LEFT WO CONTRAST Result Date: 06/13/2023 CLINICAL DATA:  Metastatic breast cancer disease evaluation. Left hip pain. Musculoskeletal neoplasm. Assess treatment response. EXAM: CT OF THE LEFT HIP WITHOUT CONTRAST TECHNIQUE: Multidetector CT imaging of the left hip was performed according to the standard protocol. Multiplanar CT image reconstructions were also generated. RADIATION DOSE REDUCTION: This exam was performed according to the departmental dose-optimization program which includes automated exposure control, adjustment of the mA and/or kV according to patient size  and/or use of iterative reconstruction technique. Multidetector CT imaging of the left hip was performed following the standard protocol without intravenous contrast. COMPARISON:  None Available. Pelvis and left hip radiographs 06/05/2023; CT right hip 06/12/2023; CT chest, abdomen, and pelvis 05/16/2023 FINDINGS: Bones/Joint/Cartilage There is diffuse decreased bone mineralization. There are again diffuse predominantly lytic skeletal metastases seen throughout the visualized lower lumbar spine, sacrum, ilium, ischium, pubis portions of the pelvis and right greater than left proximal femurs. Right hip: The right hip was recently imaged on 06/12/2023 CT. Dominant superior right acetabular lytic lesion measuring approximately 2.9 x 2.8 x 4.6 cm (transverse by AP by craniocaudal) is unchanged. There is again mild cortical step-off and a likely tiny pathologic fracture of the superomedial aspect of the right acetabulum at the inferior aspect of this  lytic lesion (coronal series 6, image 69). This also again erodes through the medial right acetabular cortex. Dominant lesion within the right proximal femoral lesser trochanter measuring up to 3.7 cm (coronal series 6, image 75) is unchanged. Two dominant right femoral head lytic lesions with the larger, anterior lesion measuring up to 3.3 cm in craniocaudal dimension (coronal series 6, image 68) and marked thinning the inferior femoral head-neck junction with punctate complete erosion of the cortex (coronal series 6, image 66), unchanged from prior. Additional scattered lucent lesions throughout the right hemipelvis. Left hemipelvis: There are again innumerable lytic lesions within the left hemisacrum and left ilium, overall mildly to moderately progressed from 05/16/2023 CT. There are multiple worsened areas of cortical erosion, including within the superior and inferior aspects of the left hemisacrum, lateral left ilium, and anterior and lateral miss mid left ilium (axial series 2, image 46) where there is a new pathologic fracture with up to 4 mm posterior displacement of the lateral ileum with respect of the medial ilium. Additional more distal lateral left iliac wing new acute pathologic fracture with 3 mm posterior displacement of the lateral component (axial series 2, image 50). Left hip: A spiculated predominantly sclerotic lesion within the left femoral intertrochanteric region extending into the greater trochanter measures up to approximately 3.3 cm in craniocaudal dimension and is very similar to 05/16/2023. Adjacent lytic lesion within the inferior left femoral intertrochanteric region measuring up to 19 mm in craniocaudal dimension moderately thinning the lateral left cortex (axial series 2, image 104 and coronal series 6, image 72) is mildly worsened from prior. There is mildly worsened erosion of the inferior medial left ilial cortex just superior to the left acetabulum (axial series 2, image 58 and  coronal series 6, image 69). L4 vertebral body lytic lesion measuring up to 2.5 cm in craniocaudal dimension is similar to 05/16/2023 CT. Ligaments Suboptimally assessed by CT. Muscles and Tendons Minimal atrophy and fatty infiltration of the left greater than right gluteus minimus and medius muscles. Soft tissues Mild-to-moderate left and mild right superior left hip subcutaneous fat edema and swelling. No free fluid is seen within the pelvis. The uterus is present. Moderate to high-grade atherosclerotic calcifications. IMPRESSION: Compared to 05/16/2023: 1. Diffuse predominantly lytic skeletal metastases throughout the visualized lower lumbar spine, left-greater-than-right pelvis, and proximal femurs. 2. There are new, acute, mildly displaced pathologic fractures of the mid left ilium and distal lateral left iliac crest, as described above. 3. Dominant superior right acetabular lytic lesion with mild cortical step-off and likely tiny pathologic fracture of the superomedial aspect of the right acetabulum is unchanged from recent 06/12/2023 CT of the right hip. 4. Dominant right femoral head  lytic lesions with marked thinning the inferior femoral head-neck junction. 5. Dominant spiculated predominantly sclerotic lesion within the left femoral intertrochanteric region extending into the greater trochanter is very similar to 05/16/2023. Adjacent lytic lesion within the inferior left femoral intertrochanteric region measuring up to 19 mm in craniocaudal dimension moderately thinning the lateral left cortex is mildly worsened from prior. Electronically Signed   By: Neita Garnet M.D.   On: 06/13/2023 13:36   CT PELVIS WO CONTRAST Result Date: 06/13/2023 CLINICAL DATA:  Metastatic breast cancer disease evaluation. Left hip pain. Musculoskeletal neoplasm. Assess treatment response. EXAM: CT OF THE LEFT HIP WITHOUT CONTRAST TECHNIQUE: Multidetector CT imaging of the left hip was performed according to the standard  protocol. Multiplanar CT image reconstructions were also generated. RADIATION DOSE REDUCTION: This exam was performed according to the departmental dose-optimization program which includes automated exposure control, adjustment of the mA and/or kV according to patient size and/or use of iterative reconstruction technique. Multidetector CT imaging of the left hip was performed following the standard protocol without intravenous contrast. COMPARISON:  None Available. Pelvis and left hip radiographs 06/05/2023; CT right hip 06/12/2023; CT chest, abdomen, and pelvis 05/16/2023 FINDINGS: Bones/Joint/Cartilage There is diffuse decreased bone mineralization. There are again diffuse predominantly lytic skeletal metastases seen throughout the visualized lower lumbar spine, sacrum, ilium, ischium, pubis portions of the pelvis and right greater than left proximal femurs. Right hip: The right hip was recently imaged on 06/12/2023 CT. Dominant superior right acetabular lytic lesion measuring approximately 2.9 x 2.8 x 4.6 cm (transverse by AP by craniocaudal) is unchanged. There is again mild cortical step-off and a likely tiny pathologic fracture of the superomedial aspect of the right acetabulum at the inferior aspect of this lytic lesion (coronal series 6, image 69). This also again erodes through the medial right acetabular cortex. Dominant lesion within the right proximal femoral lesser trochanter measuring up to 3.7 cm (coronal series 6, image 75) is unchanged. Two dominant right femoral head lytic lesions with the larger, anterior lesion measuring up to 3.3 cm in craniocaudal dimension (coronal series 6, image 68) and marked thinning the inferior femoral head-neck junction with punctate complete erosion of the cortex (coronal series 6, image 66), unchanged from prior. Additional scattered lucent lesions throughout the right hemipelvis. Left hemipelvis: There are again innumerable lytic lesions within the left hemisacrum  and left ilium, overall mildly to moderately progressed from 05/16/2023 CT. There are multiple worsened areas of cortical erosion, including within the superior and inferior aspects of the left hemisacrum, lateral left ilium, and anterior and lateral miss mid left ilium (axial series 2, image 46) where there is a new pathologic fracture with up to 4 mm posterior displacement of the lateral ileum with respect of the medial ilium. Additional more distal lateral left iliac wing new acute pathologic fracture with 3 mm posterior displacement of the lateral component (axial series 2, image 50). Left hip: A spiculated predominantly sclerotic lesion within the left femoral intertrochanteric region extending into the greater trochanter measures up to approximately 3.3 cm in craniocaudal dimension and is very similar to 05/16/2023. Adjacent lytic lesion within the inferior left femoral intertrochanteric region measuring up to 19 mm in craniocaudal dimension moderately thinning the lateral left cortex (axial series 2, image 104 and coronal series 6, image 72) is mildly worsened from prior. There is mildly worsened erosion of the inferior medial left ilial cortex just superior to the left acetabulum (axial series 2, image 58 and coronal series 6, image  69). L4 vertebral body lytic lesion measuring up to 2.5 cm in craniocaudal dimension is similar to 05/16/2023 CT. Ligaments Suboptimally assessed by CT. Muscles and Tendons Minimal atrophy and fatty infiltration of the left greater than right gluteus minimus and medius muscles. Soft tissues Mild-to-moderate left and mild right superior left hip subcutaneous fat edema and swelling. No free fluid is seen within the pelvis. The uterus is present. Moderate to high-grade atherosclerotic calcifications. IMPRESSION: Compared to 05/16/2023: 1. Diffuse predominantly lytic skeletal metastases throughout the visualized lower lumbar spine, left-greater-than-right pelvis, and proximal femurs.  2. There are new, acute, mildly displaced pathologic fractures of the mid left ilium and distal lateral left iliac crest, as described above. 3. Dominant superior right acetabular lytic lesion with mild cortical step-off and likely tiny pathologic fracture of the superomedial aspect of the right acetabulum is unchanged from recent 06/12/2023 CT of the right hip. 4. Dominant right femoral head lytic lesions with marked thinning the inferior femoral head-neck junction. 5. Dominant spiculated predominantly sclerotic lesion within the left femoral intertrochanteric region extending into the greater trochanter is very similar to 05/16/2023. Adjacent lytic lesion within the inferior left femoral intertrochanteric region measuring up to 19 mm in craniocaudal dimension moderately thinning the lateral left cortex is mildly worsened from prior. Electronically Signed   By: Neita Garnet M.D.   On: 06/13/2023 13:36   MR Lumbar Spine W Wo Contrast Result Date: 06/13/2023 CLINICAL DATA:  Spine metastases, lumbar, monitor EXAM: MRI LUMBAR SPINE WITHOUT AND WITH CONTRAST TECHNIQUE: Multiplanar and multiecho pulse sequences of the lumbar spine were obtained without and with intravenous contrast. CONTRAST:  6mL GADAVIST GADOBUTROL 1 MMOL/ML IV SOLN COMPARISON:  None Available. FINDINGS: Segmentation:  Standard segmentation is assumed. Alignment:  No substantial sagittal subluxation. Vertebrae: Extensive osseous metastatic disease throughout the visualized thoracic, lumbar and sacrum. This includes lesions at every level and lesions in the vertebral bodies and posterior elements. There is extraosseous extension of tumor including a large (5.2 x 3.7 cm) soft tissue mass which obliterates the right L1-L2 foramen and invades the right lateral canal at the L1 level. Resulting mild canal stenosis with leftward displacement of the cauda equina nerve roots. Additional index soft tissue mass involves the spinous process at L2 (2.2 x 1.9 cm  on series 12, image 18). Additional mass involving the spinous process and lamina at L5 with extension into the posterior canal. This abuts the cauda equina nerve roots without significant canal stenosis. A lesion involving the left pedicle at L5-S1 encroaches on the foramen. Conus medullaris and cauda equina: Conus extends to the L1 level. Conus appears normal. Paraspinal and other soft tissues: No evidence of acute abnormality. Disc levels: Extensive bony and extraosseous metastatic lesions are detailed above. Superimposed degenerative change with mild to moderate canal stenosis at L3-L4 and moderate foraminal stenosis at L4-L5. IMPRESSION: Extensive osseous metastatic disease throughout the thoracic, lumbar and sacrum. This includes bulky extraosseous extension of tumor with obliteration of the right L1-L2 foramen and involvement of the canal at multiple levels, detailed above. No high-grade canal stenosis. Electronically Signed   By: Feliberto Harts M.D.   On: 06/13/2023 03:09   CT HIP RIGHT WO CONTRAST Result Date: 06/12/2023 CLINICAL DATA:  Metastatic breast cancer. Right hip pain. Surgical planning. EXAM: CT OF THE RIGHT HIP WITHOUT CONTRAST TECHNIQUE: Multidetector CT imaging of the right hip was performed according to the standard protocol. Multiplanar CT image reconstructions were also generated. RADIATION DOSE REDUCTION: This exam was performed according to  the departmental dose-optimization program which includes automated exposure control, adjustment of the mA and/or kV according to patient size and/or use of iterative reconstruction technique. COMPARISON:  Radiographs 06/05/2023, CT 05/16/2023 and bone scan 05/07/2023. FINDINGS: Bones/Joint/Cartilage Again demonstrated are widespread lytic metastases throughout the visualized right hemipelvis and proximal right femur. Overall mild progression is seen compared with the CT 4 weeks earlier. For example, a lesion in the right superior acetabulum  measures up to 4.6 cm on coronal image 42/8 (previously 4.1 cm) and a lesion of the right femoral lesser trochanter measures up to 3.6 cm on coronal image 46/8 (previously 3.2 cm). There are other prominent lytic lesions within the right femoral head and subtrochanteric region. Incidental imaging of the left pelvis demonstrates progressive disease within the left sacrum and the ilium. No definite pathologic fracture significant hip joint effusion demonstrated at this time. Ligaments Suboptimally assessed by CT. Muscles and Tendons No intramuscular mass, focal atrophy or hematoma is demonstrated. Soft tissues No evidence of pelvic or inguinal adenopathy. Mild nonspecific subcutaneous edema lateral to the right hip. Mild iliofemoral atherosclerosis. IMPRESSION: 1. Mild progression of widespread lytic metastases throughout the visualized right hemipelvis and proximal right femur compared with the CT 4 weeks earlier. No definite pathologic fracture or significant hip joint effusion demonstrated at this time. 2. No evidence of soft tissue mass or adenopathy. Electronically Signed   By: Carey Bullocks M.D.   On: 06/12/2023 15:52   DG HIP UNILAT WITH PELVIS 2-3 VIEWS LEFT Result Date: 06/05/2023 CLINICAL DATA:  Left hip pain, metastatic breast cancer with skeletal involvement EXAM: DG HIP (WITH OR WITHOUT PELVIS) 2-3V LEFT COMPARISON:  CT pelvis 05/16/2023 FINDINGS: Stable sclerotic lesion in the left greater trochanter. This likely represents a treated metastatic lesion. No fracture the left proximal femur is identified. However, there is ill-defined abnormal lucency scattered throughout the bony pelvis most confluent in the left iliac bone and left sacrum compatible with lytic metastatic lesions and better shown on the CT from 05/16/2023. Pathologic pelvic fracture difficult to exclude given the heavy burden of osseous metastatic disease. There is lytic involvement in the right (contralateral) femoral head, femoral  neck, and proximal metaphysis. IMPRESSION: 1. Heavy burden of lytic osseous metastatic disease in the bony pelvis and right (contralateral) femur. Pathologic pelvic fracture difficult to exclude given the heavy burden of osseous metastatic disease. 2. Stable sclerotic lesion in the left greater trochanter, likely a treated metastatic lesion. 3. No fracture of the left proximal femur is identified. Electronically Signed   By: Gaylyn Rong M.D.   On: 06/05/2023 18:11   CT Biopsy Result Date: 06/03/2023 INDICATION: biopsy of hypermetabolic bone lesion to confirm bone metastasis EXAM: CT GUIDED BONE LESION BIOPSY MEDICATIONS: None. ANESTHESIA/SEDATION: Moderate (conscious) sedation was employed during this procedure. A total of Versed 1.5 mg and Fentanyl 100 mcg was administered intravenously. Moderate Sedation Time: 20 minutes. The patient's level of consciousness and vital signs were monitored continuously by radiology nursing throughout the procedure under my direct supervision. FLUOROSCOPY TIME:  CT dose; 115 mGycm COMPLICATIONS: None immediate. Estimated blood loss: <5 mL PROCEDURE: RADIATION DOSE REDUCTION: This exam was performed according to the departmental dose-optimization program which includes automated exposure control, adjustment of the mA and/or kV according to patient size and/or use of iterative reconstruction technique. Informed written consent was obtained from the the patient and/or patient's representative after a thorough discussion of the procedural risks, benefits and alternatives. All questions were addressed. Maximal Sterile Barrier Technique was utilized including  caps, mask, sterile gowns, sterile gloves, sterile drape, hand hygiene and skin antiseptic. A timeout was performed prior to the initiation of the procedure. The patient was positioned prone and non-contrast localization CT was performed of the pelvis to demonstrate the LEFT iliac bone lesion. Maximal barrier sterile  technique utilized including caps, mask, sterile gowns, sterile gloves, large sterile drape, hand hygiene, and chlorhexidine prep. Under sterile conditions and local anesthesia, an 11 gauge coaxial bone biopsy needle was advanced into the LEFT iliac bone lesion. Needle position was confirmed with CT imaging. Initially, bone lesion aspiration was performed. Next, the 11 gauge outer cannula was utilized to obtain a LEFT iliac bone core biopsy. Needle was removed. Hemostasis was obtained with compression. The patient tolerated the procedure well. Samples were prepared with the cytotechnologist. IMPRESSION: Successful CT-guided LEFT iliac bone lesion biopsy. Roanna Banning, MD [facility] Electronically Signed   By: Roanna Banning M.D.   On: 06/03/2023 16:36    Microbiology: Results for orders placed or performed in visit on 09/16/17  Fecal occult blood, imunochemical     Status: None   Collection Time: 09/16/17 12:00 AM  Result Value Ref Range Status   Fecal Occult Bld Negative Negative Final    Labs: CBC: Recent Labs  Lab 06/21/23 0923 06/24/23 0710  WBC 5.8 6.0  HGB 11.2* 11.1*  HCT 34.4* 33.3*  MCV 95.0 93.5  PLT 69* 79*   Basic Metabolic Panel: Recent Labs  Lab 06/21/23 0923 06/22/23 0651 06/24/23 0538 06/24/23 1014  NA 135 134* 137 130*  K 5.4* 4.8 6.0* 4.1  CL 106 106 108 106  CO2 23 22 21* 21*  GLUCOSE 150* 86 86 98  BUN 18 18 21 20   CREATININE 0.61 0.55 0.55 0.55  CALCIUM 8.1* 7.9* 8.5* 8.0*   Liver Function Tests: No results for input(s): "AST", "ALT", "ALKPHOS", "BILITOT", "PROT", "ALBUMIN" in the last 168 hours. CBG: No results for input(s): "GLUCAP" in the last 168 hours.  Discharge time spent: {LESS THAN/GREATER ZOXW:96045} 30 minutes.  Signed: Tobey Grim, MD [facility] 06/26/2023

## 2023-06-26 NOTE — Progress Notes (Signed)
Daily Progress Note   Patient Name: Darlene Beasley       Date: 06/26/2023 DOB: 1955/08/13  Age: 68 y.o. MRN#: [id number] Attending Physician: Tobey Grim, MD Primary Care Physician: Estevan Oaks, NP Admit Date: 06/05/2023  Reason for Consultation/Follow-up: Establishing goals of care  Patient Profile/HPI: 68 y.o. 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 Found to have pathologic fracture. Not a candidate for surgery. Palliative consulted for pain management and goals of care.    Subjective: Patient in bed- no complaints. She is eager to go home.  She inquired about having a Purewick at home.    Review of Systems  All other systems reviewed and are negative.    Physical Exam Vitals and nursing note reviewed.  Constitutional:      Comments: frail  Neurological:     Mental Status: She is alert and oriented to person, place, and time.  Psychiatric:        Mood and Affect: Mood normal.             Vital Signs: BP 121/64 (BP Location: Left Arm)   Pulse 97   Temp 98.3 F (36.8 C) (Oral)   Resp 18   Ht 5\' 4"  (1.626 m)   Wt 61.5 kg   SpO2 96%   BMI 23.27 kg/m  SpO2: SpO2: 96 % O2 Device: O2 Device: Room Air O2 Flow Rate: O2 Flow Rate (L/min): 2 L/min  Intake/output summary:  Intake/Output Summary (Last 24 hours) at 06/26/2023 1148 Last data filed at 06/26/2023 0951 Gross per 24 hour  Intake 240 ml  Output 400 ml  Net -160 ml   LBM: Last BM Date : 06/26/23 Baseline Weight: Weight: 66.7 kg Most recent weight: Weight: 61.5 kg       Palliative Assessment/Data: PPS: 50%      Patient Active Problem List   Diagnosis  Date Noted   Pathological fracture, pelvis, initial encounter for fracture 06/14/2023   Chronic left hip pain 06/05/2023   Metastasis to bone (HCC) 06/05/2023   Hyponatremia 06/05/2023   Hypercalcemia 06/05/2023   AKI (acute kidney injury) (HCC) 06/05/2023   Nausea & vomiting 06/05/2023   Intractable pain  06/05/2023   Acute hypoxic respiratory failure (HCC) 06/05/2023   Bilateral leg edema 06/05/2023   HTN (hypertension) 06/05/2023   Genetic testing 11/14/2017   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   Adopted    Port-A-Cath in place 10/22/2017   Cancer of overlapping sites of right female breast (HCC) 10/08/2017   Malignant neoplasm of left breast in female, estrogen receptor positive (HCC) 10/08/2017    Palliative Care Assessment & Plan    Assessment/Recommendations/Plan  Stage IV breast ca, estrogen receptor positive, mets to bone with pathologic hip fracture- goc is to continue life prolonging treatments and live best possible quality- wishes to start Ibrance as recommended by Oncology Appreciate TOC assisting with home health and equipment needs- discussed with Darlene Beasley - case manager- unfortunately purewick is not covered by Medicare   Code Status: DNR  Prognosis:  Unable to determine  Discharge Planning: Home with Home Health  Care plan was discussed with family and care team.   Thank you for allowing the Palliative Medicine Team to assist in the care of this patient.  Total time:  25 minutes Prolonged billing:  Time includes:   Preparing to see the patient (e.g., review of tests) Obtaining and/or reviewing separately obtained history Performing a medically necessary appropriate examination and/or evaluation Counseling and educating the patient/family/caregiver Ordering medications, tests, or procedures Referring and communicating with other health care professionals (when not reported separately) Documenting clinical information in the electronic or other  health record Independently interpreting results (not reported separately) and communicating results to the patient/family/caregiver Care coordination (not reported separately) Clinical documentation  Darlene Beasley, AGNP-C Palliative Medicine   Please contact Palliative Medicine Team phone at [phone] for questions and concerns.

## 2023-06-26 NOTE — Care Management Important Message (Signed)
Important Message  Patient Details IM Letter not given due to discharging with Hospice. Name: Darlene Beasley MRN: [id number] Date of Birth: [birth year]   Important Message Given:  No     Caren Macadam 06/26/2023, 12:25 PM

## 2023-06-26 NOTE — Progress Notes (Addendum)
PROGRESS NOTE  Darlene Beasley  VFI:[id number] DOB: 19-Aug-1955 DOA: 06/05/2023 PCP: Estevan Oaks, NP  Consultants  Brief Narrative: Brief hospital course: 68 year old woman PMH breast cancer status post left lumpectomy, right mastectomy, chemotherapy, radiation, antiestrogen therapy, with newly diagnosed diffuse bone metastatic disease in November who presented with left hip pain and nausea and vomiting. Previously quite active but since November pain progressed to the point where she was mostly wheelchair-bound. Presented to the emergency department 12/26 from radiation oncology. Admitted for acute on chronic hip pain secondary to malignancy and metastatic disease. Completed radiation therapy during hospitalization. Was seen by oncology. Also seen by palliative medicine. High risk for right proximal femoral illogic fracture, acetabular fracture present on the left. Unable to ambulate safely. Pain limits ambulation anyway. Consulted orthopedics here, not a candidate for surgery at this facility. Per Onc-Surg at [facility], patient not a candidate for surgery.  Desired home with palliative following and continued treatment with Dr. Mosetta Putt to realize today 1/16 and family could not care for her now awaiting SNF placement   Assessment and Plan: * Hip pain -Pain control has been going well as long as patient is not moving. - Per Dr. Ranae Palms, patient is not a surgical candidate.  Recommending palliative care. -Working plan for most the day was home with home health therapies.  However as the day progressed she came to the realization that she cannot go home with current level of home support and is now opting for SNF. -SNF search started.   Cancer of overlapping sites of right female breast [facility]) -breast cancer s/p left lumpectomy, right mastectomy, chemotherapy, radiation on antiestrogen therapy - Appreciate oncology input.   HTN (hypertension) -continue amlodipine  Hyperkalemia, mild: Elevated again  today.  Recheck was 4.1.  Initially low, was ordered but held off due to recheck. Presume will moved to comfort care after palliative consult.  Blood checks.   Bilateral leg edema -Resolved   Acute hypoxic respiratory failure (HCC) -secondary to administrating of IV opioid for pain -Continue to monitor and wean as tolerated with goal O2 greater than 92% -Judicious use of  opioids for pain with close monitoring   Nausea & vomiting -better today  -PRN antiemetic.    AKI (acute kidney injury) (HCC) Pre-renal due to decrease oral intake - resolved   Hypercalcemia -corrected Ca of 11.8 on admission, improved to  -Will hydrate overnight and follow trend in the morning. If it remains elevated, will need calcitonin and bisphosphonate.   Hyponatremia - Secondary to inconsistent oral intake/hypovolemia. - Has been up and down. Rechecking         DVT prophylaxis:  Place and maintain sequential compression device Start: 06/13/23 1449  Code Status:   Code Status: Limited: Do not attempt resuscitation (DNR) -DNR-LIMITED -Do Not Intubate/DNI  Level of care: Telemetry Status is: Inpatient Disposition: Medically ready for discharge.  Now awaiting SNF placement    Subjective: Patient feels little more tired today than yesterday.  Feels her pain is under control when she is not moving as per her usual.  She is resigned to not receiving surgery.  She would like to continue Ibrance per oncology recommendations.  Hunger/appetite continues to decline.  Objective: Vitals:   06/25/23 2109 06/26/23 0624 06/26/23 1037 06/26/23 1511  BP: 108/75 119/69 121/64 116/73  Pulse: 89 93 97 (!) 103  Resp: 18 18 18    Temp: 98.4 F (36.9 C) 98.1 F (36.7 C) 98.3 F (36.8 C) 98 F (36.7 C)  TempSrc:  Oral Oral Oral Oral  SpO2: 96% 96% 96% 98%  Weight:      Height:        Intake/Output Summary (Last 24 hours) at 06/26/2023 1614 Last data filed at 06/26/2023 0951 Gross per 24 hour  Intake 240 ml   Output --  Net 240 ml   Filed Weights   06/05/23 1644 06/06/23 0205  Weight: 66.7 kg 61.5 kg   Body mass index is 23.27 kg/m.  Gen: 68 y.o. female in no apparent distress.  Nontoxic Pulm: Non-labored breathing.  Clear to auscultation bilaterally.  CV: Regular rate and rhythm. No murmur, rub, or gallop. No JVD GI: Abdomen soft, non-tender, non-distended, with normoactive bowel sounds. No organomegaly or masses felt. Ext: Warm, no deformities, no pedal edema Skin: No rashes, lesions  Neuro: Alert and oriented. No focal neurological deficits.  Able to move legs and feet and toes Psych: Calm  Judgement and insight appear normal. Mood & affect appropriate.     I have personally reviewed the following labs and images: CBC: Recent Labs  Lab 06/21/23 0923 06/24/23 0710  WBC 5.8 6.0  HGB 11.2* 11.1*  HCT 34.4* 33.3*  MCV 95.0 93.5  PLT 69* 79*   BMP &GFR Recent Labs  Lab 06/21/23 0923 06/22/23 0651 06/24/23 0538 06/24/23 1014  NA 135 134* 137 130*  K 5.4* 4.8 6.0* 4.1  CL 106 106 108 106  CO2 23 22 21* 21*  GLUCOSE 150* 86 86 98  BUN 18 18 21 20   CREATININE 0.61 0.55 0.55 0.55  CALCIUM 8.1* 7.9* 8.5* 8.0*   Estimated Creatinine Clearance: 57.3 mL/min (by C-G formula based on SCr of 0.55 mg/dL). Liver & Pancreas: No results for input(s): "AST", "ALT", "ALKPHOS", "BILITOT", "PROT", "ALBUMIN" in the last 168 hours.  No results for input(s): "LIPASE", "AMYLASE" in the last 168 hours. No results for input(s): "AMMONIA" in the last 168 hours. Diabetic: No results for input(s): "HGBA1C" in the last 72 hours. No results for input(s): "GLUCAP" in the last 168 hours. Cardiac Enzymes: No results for input(s): "CKTOTAL", "CKMB", "CKMBINDEX", "TROPONINI" in the last 168 hours. No results for input(s): "PROBNP" in the last 8760 hours. Coagulation Profile: No results for input(s): "INR", "PROTIME" in the last 168 hours. Thyroid Function Tests: No results for input(s): "TSH",  "T4TOTAL", "FREET4", "T3FREE", "THYROIDAB" in the last 72 hours. Lipid Profile: No results for input(s): "CHOL", "HDL", "LDLCALC", "TRIG", "CHOLHDL", "LDLDIRECT" in the last 72 hours. Anemia Panel: No results for input(s): "VITAMINB12", "FOLATE", "FERRITIN", "TIBC", "IRON", "RETICCTPCT" in the last 72 hours. Urine analysis:    Component Value Date/Time   COLORURINE YELLOW 06/05/2023 2154   APPEARANCEUR CLEAR 06/05/2023 2154   LABSPEC 1.006 06/05/2023 2154   PHURINE 5.0 06/05/2023 2154   GLUCOSEU NEGATIVE 06/05/2023 2154   HGBUR NEGATIVE 06/05/2023 2154   BILIRUBINUR NEGATIVE 06/05/2023 2154   BILIRUBINUR small 06/04/2013 1648   KETONESUR NEGATIVE 06/05/2023 2154   PROTEINUR NEGATIVE 06/05/2023 2154   UROBILINOGEN 0.2 06/04/2013 1648   NITRITE NEGATIVE 06/05/2023 2154   LEUKOCYTESUR NEGATIVE 06/05/2023 2154   Sepsis Labs: Invalid input(s): "PROCALCITONIN", "LACTICIDVEN"  Microbiology: No results found for this or any previous visit (from the past 240 hours).  Radiology Studies: No results found.  Scheduled Meds:  amLODipine  5 mg Oral Daily   cyclobenzaprine  5 mg Oral TID   dexamethasone (DECADRON) injection  6 mg Intravenous Q24H   feeding supplement  237 mL Oral BID BM   letrozole  2.5 mg Oral  Daily   oxyCODONE  15 mg Oral Q12H   pantoprazole  40 mg Oral Daily   polyethylene glycol  17 g Oral BID   senna-docusate  1 tablet Oral BID   Continuous Infusions:  promethazine (PHENERGAN) injection (IM or IVPB) 150 mL/hr at 06/19/23 0309     LOS: 20 days   35 minutes with more than 50% spent in reviewing records, counseling patient/family and coordinating care.  Tobey Grim, MD [email or web address] 06/26/2023, 4:14 PM

## 2023-06-27 DIAGNOSIS — G8929 Other chronic pain: Secondary | ICD-10-CM | POA: Diagnosis not present

## 2023-06-27 DIAGNOSIS — C50811 Malignant neoplasm of overlapping sites of right female breast: Secondary | ICD-10-CM | POA: Diagnosis not present

## 2023-06-27 DIAGNOSIS — M84454A Pathological fracture, pelvis, initial encounter for fracture: Secondary | ICD-10-CM | POA: Diagnosis not present

## 2023-06-27 DIAGNOSIS — C50919 Malignant neoplasm of unspecified site of unspecified female breast: Secondary | ICD-10-CM | POA: Diagnosis not present

## 2023-06-27 DIAGNOSIS — M25552 Pain in left hip: Secondary | ICD-10-CM | POA: Diagnosis not present

## 2023-06-27 DIAGNOSIS — G893 Neoplasm related pain (acute) (chronic): Secondary | ICD-10-CM | POA: Diagnosis not present

## 2023-06-27 DIAGNOSIS — Z515 Encounter for palliative care: Secondary | ICD-10-CM | POA: Diagnosis not present

## 2023-06-27 DIAGNOSIS — C7951 Secondary malignant neoplasm of bone: Secondary | ICD-10-CM | POA: Diagnosis not present

## 2023-06-27 LAB — BASIC METABOLIC PANEL
Anion gap: 9 (ref 5–15)
BUN: 29 mg/dL — ABNORMAL HIGH (ref 8–23)
CO2: 21 mmol/L — ABNORMAL LOW (ref 22–32)
Calcium: 8.7 mg/dL — ABNORMAL LOW (ref 8.9–10.3)
Chloride: 106 mmol/L (ref 98–111)
Creatinine, Ser: 0.63 mg/dL (ref 0.44–1.00)
GFR, Estimated: >60 mL/min (ref >60)
Glucose, Bld: 94 mg/dL (ref 70–99)
Potassium: 4.4 mmol/L (ref 3.5–5.1)
Sodium: 136 mmol/L (ref 135–145)

## 2023-06-27 MED ORDER — SENNOSIDES-DOCUSATE SODIUM 8.6-50 MG PO TABS
2.0000 | ORAL_TABLET | Freq: Two times a day (BID) | ORAL | Status: DC
Start: 2023-06-27 — End: 2023-07-03
  Administered 2023-06-27 – 2023-07-02 (×10): 2 via ORAL
  Filled 2023-06-27 (×11): qty 2

## 2023-06-27 MED ORDER — NYSTATIN 100000 UNIT/ML MT SUSP
5.0000 mL | Freq: Four times a day (QID) | OROMUCOSAL | Status: AC
  Administered 2023-06-27 – 2023-06-28 (×5): 500000 [IU] via ORAL
  Filled 2023-06-27 (×5): qty 5

## 2023-06-27 MED ORDER — BOOST / RESOURCE BREEZE PO LIQD CUSTOM
1.0000 | Freq: Three times a day (TID) | ORAL | Status: DC
Start: 2023-06-27 — End: 2023-07-03
  Administered 2023-06-27: 1 via ORAL
  Administered 2023-06-27: 237 mL via ORAL
  Administered 2023-06-28 – 2023-07-02 (×11): 1 via ORAL

## 2023-06-27 MED ORDER — GLYCERIN (LAXATIVE) 2.1 G RE SUPP
1.0000 | Freq: Once | RECTAL | Status: AC
  Administered 2023-06-27: 1 via RECTAL
  Filled 2023-06-27: qty 1

## 2023-06-27 NOTE — Progress Notes (Signed)
PROGRESS NOTE  Darlene Beasley  YNW:[id number] DOB: [birth year] DOA: 06/05/2023 PCP: Estevan Oaks, NP  Consultants: palliative  Brief Narrative: Brief hospital course: 68 year old woman PMH breast cancer status post left lumpectomy, right mastectomy, chemotherapy, radiation, antiestrogen therapy, with newly diagnosed diffuse bone metastatic disease in November who presented with left hip pain and nausea and vomiting. secondary to malignancy and metastatic disease. Completed radiation therapy during hospitalization. Was seen by oncology. Also seen by palliative medicine. High risk for right proximal femoral pathologic fracture, acetabular fracture present on the left. Unable to ambulate safely. Consulted orthopedics here, not a candidate for surgery at this facility. Per Onc-Surg at [facility], patient not a candidate for surgery.  now awaiting SNF placement with palliative care.    Assessment and Plan: Hip pain- secondary to metastasis, chronic degeneration - continue analgesia PRN, bowel regimen - Per Dr. Ranae Palms, [facility], patient is not a surgical candidate.  Recommending palliative care. - PT/OT - TOC consulted for SNF placement  Cancer of overlapping sites of right female breast [facility]) With metastasis to bones -breast cancer s/p left lumpectomy, right mastectomy, chemotherapy, radiation on antiestrogen therapy - Appreciate oncology input.  - continue letrozole. Ibrance after discharge.   - follow up outpatient 3-4 weeks - palliative following   HTN (hypertension) -continue amlodipine  Hyperkalemia, mild:resolved to 4.4   Bilateral leg edema -Resolved   Acute hypoxic respiratory failure (HCC) -secondary to administrating of IV opioid for pain -Continue to monitor and wean as tolerated with goal O2 greater than 92% -Judicious use of  opioids for pain with close monitoring   Nausea & vomiting -better today  -PRN antiemetic.    AKI (acute kidney injury) (HCC) Pre-renal due to  decrease oral intake - resolved   Hypercalcemia -corrected Ca of 11.8 on admission, improved to  -Will hydrate overnight and follow trend in the morning. If it remains elevated, will need calcitonin and bisphosphonate.   Hyponatremia- resolved.  Na+ 136 - Secondary to inconsistent oral intake/hypovolemia. - Has been up and down. Rechecking      DVT prophylaxis:  Place and maintain sequential compression device Start: 06/13/23 1449  Code Status:   Code Status: Limited: Do not attempt resuscitation (DNR) -DNR-LIMITED -Do Not Intubate/DNI  Level of care: Telemetry Status is: Inpatient Disposition: Medically ready for discharge.  Now awaiting SNF placement    Subjective: Patient reports feeling alright when not moving but is having difficulty with even slight movements. Cannot go home and care for herself.   Objective: Vitals:   06/26/23 1037 06/26/23 1511 06/26/23 2125 06/27/23 0626  BP: 121/64 116/73 112/71 103/76  Pulse: 97 (!) 103 97 99  Resp: 18  18 18   Temp: 98.3 F (36.8 C) 98 F (36.7 C) 98.3 F (36.8 C) 98.1 F (36.7 C)  TempSrc: Oral Oral    SpO2: 96% 98% 93% 93%  Weight:      Height:        Intake/Output Summary (Last 24 hours) at 06/27/2023 0740 Last data filed at 06/26/2023 1900 Gross per 24 hour  Intake 716 ml  Output --  Net 716 ml   Filed Weights   06/05/23 1644 06/06/23 0205  Weight: 66.7 kg 61.5 kg   Body mass index is 23.27 kg/m.  Gen: 68 y.o. female in no apparent distress.  Nontoxic Pulm: Non-labored breathing.  Clear to auscultation bilaterally.  CV: Regular rate and rhythm. No murmur, rub, or gallop. No JVD GI: Abdomen soft, non-tender, non-distended, with normoactive bowel sounds.  No organomegaly or masses felt. Ext: Warm, no deformities, no pedal edema Skin: No rashes, lesions  Neuro: Alert and oriented. No focal neurological deficits.  Able to move legs and feet and toes Psych: Calm  Judgement and insight appear normal. Mood & affect  appropriate.     I have personally reviewed the following labs and images: CBC: Recent Labs  Lab 06/21/23 0923 06/24/23 0710  WBC 5.8 6.0  HGB 11.2* 11.1*  HCT 34.4* 33.3*  MCV 95.0 93.5  PLT 69* 79*   BMP &GFR Recent Labs  Lab 06/21/23 0923 06/22/23 0651 06/24/23 0538 06/24/23 1014  NA 135 134* 137 130*  K 5.4* 4.8 6.0* 4.1  CL 106 106 108 106  CO2 23 22 21* 21*  GLUCOSE 150* 86 86 98  BUN 18 18 21 20   CREATININE 0.61 0.55 0.55 0.55  CALCIUM 8.1* 7.9* 8.5* 8.0*   Microbiology: No results found for this or any previous visit (from the past 240 hours).  Radiology Studies: No results found.  Scheduled Meds:  amLODipine  5 mg Oral Daily   cyclobenzaprine  5 mg Oral TID   dexamethasone (DECADRON) injection  6 mg Intravenous Q24H   feeding supplement  237 mL Oral BID BM   letrozole  2.5 mg Oral Daily   oxyCODONE  15 mg Oral Q12H   pantoprazole  40 mg Oral Daily   polyethylene glycol  17 g Oral BID   senna-docusate  1 tablet Oral BID   Continuous Infusions:  promethazine (PHENERGAN) injection (IM or IVPB) 150 mL/hr at 06/19/23 0309     LOS: 21 days   35 minutes with more than 50% spent in reviewing records, counseling patient/family and coordinating care.  Leeroy Bock, MD [email or web address] 06/27/2023, 7:40 AM

## 2023-06-27 NOTE — Progress Notes (Signed)
SLP Cancellation Note  Patient Details Name: Darlene Beasley MRN: [id number] DOB: 09-06-1955   Cancelled treatment:       Reason Eval/Treat Not Completed: Patient at procedure or test/unavailable. Pt with nursing for pt care. Will continue efforts to complete BSE. RN aware.  Moneka Mcquinn B. Murvin Natal, [facility], CCC-SLP Speech Language Pathologist  Leigh Aurora 06/27/2023, 11:32 AM

## 2023-06-27 NOTE — Plan of Care (Signed)

## 2023-06-27 NOTE — TOC Progression Note (Signed)
Transition of Care [facility]) - Progression Note    Patient Details  Name: Darlene Beasley MRN: [id number] Date of Birth: 1955/08/27  Transition of Care [facility]) CM/SW Contact  Amada Jupiter, LCSW Phone Number: 06/27/2023, 3:55 PM  Clinical Narrative:    Spoke with treatment team and patient today to review new plan for SNF rehab.  All aware that we will need to have documentation from therapies with functional level information and justification for SNF recommendation in order to secure insurance authorization.  Awaiting notes at this point.  SNF bed search was started yesterday, however, need therapy information to relay to facilities as well.  Per Dr. Mosetta Putt, oncology plan is for pt to take letrozole and Ibrance (to be added on after discharge) and follow back up with MD 2-4 weeks. Will relay this info to SNFs as well.      Barriers to Discharge: Continued Medical Work up  Expected Discharge Plan and Services         Expected Discharge Date: 06/26/23                                     Social Determinants of Health (SDOH) Interventions SDOH Screenings   Food Insecurity: Food Insecurity Present (05/28/2023)  Housing: High Risk (05/28/2023)  Transportation Needs: No Transportation Needs (05/28/2023)  Utilities: At Risk (05/28/2023)  Alcohol Screen: Low Risk  (05/10/2019)  Depression (PHQ2-9): Low Risk  (05/28/2023)  Physical Activity: Sufficiently Active (09/16/2017)  Social Connections: Socially Isolated (06/09/2023)  Stress: Stress Concern Present (09/16/2017)  Tobacco Use: High Risk (06/05/2023)    Readmission Risk Interventions    06/06/2023   11:23 PM  Readmission Risk Prevention Plan  Transportation Screening Complete  PCP or Specialist Appt within 5-7 Days Complete  Medication Review (RN CM) Referral to Pharmacy

## 2023-06-27 NOTE — Progress Notes (Signed)
Daily Progress Note   Patient Name: Darlene Beasley       Date: 06/27/2023 DOB: July 22, 1955  Age: 68 y.o. MRN#: [id number] Attending Physician: Leeroy Bock, MD Primary Care Physician: Estevan Oaks, NP Admit Date: 06/05/2023  Reason for Consultation/Follow-up: Establishing goals of care  Patient Profile/HPI: 68 y.o. 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 Found to have pathologic fracture. Not a candidate for surgery. Palliative consulted for pain management and goals of care.    Subjective: Chart reviewed including labs, progress notes, imaging from this and previous encounters.  Noted patient has decided to seek SNF for rehab.  Per nurse tech patient was able to sit on edge of bed today and walked with a walker to the bathroom, however, she became fatigued and had to use the steady to get back to bed.  Patient shares she has been reluctant to participate with PT due to fear of pain- we discussed ensuring prn pain medication is given prior to PT evaluation. She is willing to work with PT today and at SNF.  She complains of constipation- had very small BM yesterday but feels full- NT noted she was "trying to dig" her stool out. She notes she has a difficult time using bedpan- she agreed to try bedside toilet.  We discussed that regaining strength after extended hospitalization takes time and work. She is willing to accept both and hopes for SNF placement for rehab in efforts to return home. She wishes to continue cancer directed therapy.    Review of Systems  All other systems reviewed and are negative.    Physical Exam Vitals and  nursing note reviewed.  Constitutional:      Comments: frail  Neurological:     Mental Status: She is alert and oriented to person, place, and time.  Psychiatric:        Mood and Affect: Mood normal.             Vital Signs: BP 121/71 (BP Location: Left Arm)   Pulse 100   Temp 98.7 F (37.1 C) (Oral)   Resp 18   Ht 5\' 4"  (1.626 m)   Wt 61.5 kg  SpO2 96%   BMI 23.27 kg/m  SpO2: SpO2: 96 % O2 Device: O2 Device: Room Air O2 Flow Rate: O2 Flow Rate (L/min): 2 L/min  Intake/output summary:  Intake/Output Summary (Last 24 hours) at 06/27/2023 1215 Last data filed at 06/26/2023 1900 Gross per 24 hour  Intake 476 ml  Output --  Net 476 ml   LBM: Last BM Date : 06/26/23 Baseline Weight: Weight: 66.7 kg Most recent weight: Weight: 61.5 kg       Palliative Assessment/Data: PPS: 50%      Patient Active Problem List   Diagnosis Date Noted   Pathological fracture, pelvis, initial encounter for fracture 06/14/2023   Chronic left hip pain 06/05/2023   Metastasis to bone (HCC) 06/05/2023   Hyponatremia 06/05/2023   Hypercalcemia 06/05/2023   AKI (acute kidney injury) (HCC) 06/05/2023   Nausea & vomiting 06/05/2023   Neoplasm related pain 06/05/2023   Acute hypoxic respiratory failure (HCC) 06/05/2023   Bilateral leg edema 06/05/2023   HTN (hypertension) 06/05/2023   Genetic testing 11/14/2017   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   Adopted    Port-A-Cath in place 10/22/2017   Cancer of overlapping sites of right female breast (HCC) 10/08/2017   Malignant neoplasm of left breast in female, estrogen receptor positive (HCC) 10/08/2017    Palliative Care Assessment & Plan    Assessment/Recommendations/Plan  Stage IV breast ca, estrogen receptor positive, mets to bone with pathologic hip fracture- goc is to continue life prolonging treatments and live best possible quality- wishes to start Ibrance as recommended by Oncology Constipation- likely opioid induced  as well as slow transit due to decreased mobility- continue Miralax BID, add glycerin suppository, increase senna to 2 tabs po bid   Code Status: DNR  Prognosis:  Unable to determine  Discharge Planning: Home with Home Health  Care plan was discussed with family and care team.   Thank you for allowing the Palliative Medicine Team to assist in the care of this patient.  Visit includes:   Preparing to see the patient (e.g., review of tests) Obtaining and/or reviewing separately obtained history Performing a medically necessary appropriate examination and/or evaluation Counseling and educating the patient/family/caregiver Ordering medications, tests, or procedures Referring and communicating with other health care professionals (when not reported separately) Documenting clinical information in the electronic or other health record Independently interpreting results (not reported separately) and communicating results to the patient/family/caregiver Care coordination (not reported separately) Clinical documentation  Ocie Bob, AGNP-C Palliative Medicine   Please contact Palliative Medicine Team phone at [phone] for questions and concerns.

## 2023-06-27 NOTE — Progress Notes (Signed)
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)   Pathological fracture, pelvis, initial encounter for fracture 06/14/2023   Personal history of chemotherapy    Personal history of radiation therapy    Past Surgical History:  Past Surgical History:  Procedure Laterality Date   BREAST LUMPECTOMY Left 04/03/2018   BREAST LUMPECTOMY WITH RADIOACTIVE SEED AND SENTINEL LYMPH NODE BIOPSY Left 04/03/2018   Procedure: LEFT BREAST LUMPECTOMY WITH RADIOACTIVE SEED AND LEFT SENTINEL LYMPH NODE BIOPSY;  Surgeon: Claud Kelp, MD;  Location: [facility];  Service: General;  Laterality: Left;   IR IMAGING GUIDED PORT INSERTION  10/20/2017   IR US GUIDE VASC ACCESS LEFT  10/20/2017   MASTECTOMY Right 04/03/2018   MASTECTOMY MODIFIED RADICAL Right 04/03/2018   Procedure: MASTECTOMY MODIFIED RADICAL;  Surgeon: Claud Kelp, MD;  Location: [facility];  Service: General;  Laterality: Right;   PORT-A-CATH REMOVAL N/A 04/03/2018   Procedure: REMOVAL PORT-A-CATH;  Surgeon: Claud Kelp, MD;  Location: [facility];  Service: General;  Laterality: N/A;   TONSILLECTOMY Bilateral    TUBAL LIGATION     HPI:  68yo female admitted 06/04/24 with hip pain, n/v. PMH: arthritis, breast cancer s/p chemorad tx, bone mets, GERD    Assessment / Plan / Recommendation  Clinical Impression   Patient presents with primary complaint of xerostomia impacting her ability to eat.  She does appear with white patches on lateral lingual surface, anterior faucial pillars, and posterior bilateral buccal region concerning for oral candidiasis.  Cranial nerve exam unremarkable.   Patient observed eating parts of her dinner including consuming water tea and baked potato.  Swallow clinically judged to be timely and strong. No report of deficits and no indication of aspiration.    She does not drink liquids during her meals and at this time SLP advised that she try to drink some liquids between her bites to help keep her mouth moist.  Also discussed ordering extra gravies and sauces with every meal and starting all intake with liquids.   Pt was observed to drink some tea throughout her meal tonight. Encouraged her to order extra gravy/sauces with al her meals as well.   Reviewed importance of oral care morning and night and recommended patient monitor oral cavity for white patches.  Anticipate her oral candidiasis treatment may be helpful to maximize her comfort with intake.    Patient does endorse some history of reflux but does not report that causing a significant issue at this time and she is on a reflux medication.  No SLP follow-up needed as patient educated to xerostomia medication strategies.  Thanks for this consult. SLP Visit Diagnosis: Dysphagia, oral phase (R13.11)    Aspiration Risk  No limitations    Diet Recommendation Regular;Thin liquid    Medication Administration: Whole meds with liquid (one at a time) Supervision: Patient able to self feed Compensations: Slow rate;Small  sips/bites (Drink liquids throughout meal)    Other  Recommendations Oral Care Recommendations: Oral care BID    Recommendations for follow up therapy are one component of a multi-disciplinary discharge planning process, led by the attending physician.  Recommendations may be updated based on patient status,  additional functional criteria and insurance authorization.  Follow up Recommendations No SLP follow up      Assistance Recommended at Discharge  N/a  Functional Status Assessment  N/a  Frequency and Duration   N/a         Prognosis  N/a     Swallow Study   General Date of Onset: 06/05/23 HPI: 68yo female admitted 06/04/24 with hip pain, n/v. PMH: arthritis, breast cancer s/p chemorad tx, bone mets, GERD Type of Study: Bedside Swallow Evaluation Previous Swallow Assessment: none Diet Prior to this Study: Regular;Thin liquids (Level 0) Temperature Spikes Noted: No Respiratory Status: Room air History of Recent Intubation: No Behavior/Cognition: Alert;Cooperative Oral Cavity Assessment: Other (comment) Oral Care Completed by SLP: Yes Oral Cavity - Dentition: Adequate natural dentition;Other (Comment) (missing posterior) Vision: Functional for self-feeding Self-Feeding Abilities: Able to feed self Patient Positioning: Upright in bed Baseline Vocal Quality: Normal Volitional Cough: Strong Volitional Swallow: Able to elicit    Oral/Motor/Sensory Function Overall Oral Motor/Sensory Function: Within functional limits   Ice Chips Ice chips: Not tested   Thin Liquid Thin Liquid: Within functional limits Presentation: Self Fed;Straw    Nectar Thick Nectar Thick Liquid: Not tested   Honey Thick Honey Thick Liquid: Not tested   Puree Puree: Within functional limits Presentation: Self Fed;Spoon   Solid     Solid: Within functional limits Presentation: Self Orvan July 06/27/2023,7:16 PM  Rolena Infante, MS [facility] SLP Acute Rehab Services Office [phone]

## 2023-06-28 DIAGNOSIS — M25552 Pain in left hip: Secondary | ICD-10-CM | POA: Diagnosis not present

## 2023-06-28 DIAGNOSIS — G8929 Other chronic pain: Secondary | ICD-10-CM | POA: Diagnosis not present

## 2023-06-28 MED ORDER — MIRTAZAPINE 15 MG PO TABS
7.5000 mg | ORAL_TABLET | Freq: Every day | ORAL | Status: DC
  Administered 2023-06-28 – 2023-07-02 (×5): 7.5 mg via ORAL
  Filled 2023-06-28 (×5): qty 1

## 2023-06-28 NOTE — Evaluation (Signed)
Occupational Therapy Evaluation Patient Details Name: Darlene Beasley MRN: [id number] DOB: [birth year] Today's Date: 06/28/2023   History of Present Illness Patient is a 68 year old female who presented on 12/27 with increased pain. Patient was admitted with cancer of overlapping sites with mets to bones. Patient noted to be high risk for right proximal femoral pathologic fracture and has acetabular fracture on L side, and acute hypoxic respiratory failure. PMH: AKI, hypercalcemia, hyponatremia, breast cancer s/p L lumpectomy, R mastectomy,   Clinical Impression   Patient is a 68 year old female who was admitted for above. Patient was living at home with significant other support prior to arrival. Patient was re evaluated on this date with patients pain better under control. Patient was supervision for supine to sit on EOB with increased time. Patient recommended to be NWB for LLE at this time per orders. Patient still eager to stand and transfer with MD consulted to have GOC discussion with patient and family. Patient was limited to transfer level prior to arrival with support from significant other. Patient rehab potential guarded at this time with current status and PLOF status being similar. OT to continue to follow to update recommendations as appropriate and assist with any training needed with patients NWB status.       If plan is discharge home, recommend the following: Assistance with cooking/housework;Direct supervision/assist for medications management;Assist for transportation;Help with stairs or ramp for entrance;Direct supervision/assist for financial management;A lot of help with bathing/dressing/bathroom;A lot of help with walking and/or transfers    Functional Status Assessment  Patient has had a recent decline in their functional status and/or demonstrates limited ability to make significant improvements in function in a reasonable and predictable amount of time  Equipment  Recommendations  BSC/3in1 (drop arm BSC)       Precautions / Restrictions Precautions Precautions: Fall Precaution Comments: high risk of fracture on RLE and pelvic fractures on L side, L femur impending fracture Restrictions Weight Bearing Restrictions Per Provider Order: Yes LLE Weight Bearing Per Provider Order: Non weight bearing      Mobility Bed Mobility Overal bed mobility: Needs Assistance   Rolling: Supervision                  Balance Overall balance assessment: Needs assistance Sitting-balance support: Bilateral upper extremity supported, Feet supported Sitting balance-Leahy Scale: Fair         ADL either performed or assessed with clinical judgement   ADL Overall ADL's : Needs assistance/impaired Eating/Feeding: Sitting;Set up Eating/Feeding Details (indicate cue type and reason): reported sitting on EOB to eat. patient reported that Grooming: Wash/dry face;Sitting Grooming Details (indicate cue type and reason): EOB Upper Body Bathing: Sitting;Set up   Lower Body Bathing: Bed level;Maximal assistance   Upper Body Dressing : Sitting;Set up   Lower Body Dressing: Bed level;Maximal assistance   Toilet Transfer: Minimal assistance;Rolling walker (2 wheels) Toilet Transfer Details (indicate cue type and reason): patient turning with RW with use of BUE to push weight off BLE to advance to [facility] with increased time. patient with increased pain in LLE. Toileting- Clothing Manipulation and Hygiene: Bed level;Total assistance         General ADL Comments: patient reported that husband had helped her walk into bathroom on 1/16 with increased time. patient described it as "dragging" herself while standing. patient unclear on recommendations for WB status at this time. message was sent to MD reguarding WB status and need to discuss with family GOC. MD to  address     Vision Patient Visual Report: No change from baseline              Pertinent Vitals/Pain  Pain Assessment Pain Assessment: Faces Faces Pain Scale: Hurts little more Pain Location: LLE Pain Descriptors / Indicators: Guarding Pain Intervention(s): Limited activity within patient's tolerance, Monitored during session, Repositioned     Extremity/Trunk Assessment Upper Extremity Assessment Upper Extremity Assessment: Overall WFL for tasks assessed   Lower Extremity Assessment Lower Extremity Assessment: LLE deficits/detail;RLE deficits/detail RLE Deficits / Details: able to move the theleg laterally on the bed to right LLE Deficits / Details: moves the leg with use of UE's  laterally   Cervical / Trunk Assessment Cervical / Trunk Assessment: Kyphotic   Communication Communication Communication: No apparent difficulties   Cognition Arousal: Alert Behavior During Therapy: WFL for tasks assessed/performed Overall Cognitive Status: Within Functional Limits for tasks assessed         General Comments: patient is motivated to participate in session.                Home Living Family/patient expects to be discharged to:: Private residence Living Arrangements: Spouse/significant other Available Help at Discharge: Family;Available PRN/intermittently Type of Home: House Home Access: Stairs to enter Entrance Stairs-Number of Steps: 2   Home Layout: Able to live on main level with bedroom/bathroom               Home Equipment: Agricultural consultant (2 wheels);Cane - single point;Wheelchair - manual   Additional Comments: living room requires 2 steps. patient reported today that they are in the process of building a ramp      Prior Functioning/Environment Prior Level of Function : Needs assist             Mobility Comments: only transfers to w/c at this time due to pain, assist from spouse for transfers and for steps with w/c ADLs Comments: has been using a "pot" like bed pan type situation for toileting        OT Problem List: Impaired balance (sitting  and/or standing);Decreased knowledge of precautions;Pain      OT Treatment/Interventions: Self-care/ADL training;DME and/or AE instruction;Therapeutic activities;Balance training;Patient/family education    OT Goals(Current goals can be found in the care plan section) Acute Rehab OT Goals Patient Stated Goal: to go home OT Goal Formulation: With patient Time For Goal Achievement: 07/12/23 Potential to Achieve Goals: Fair  OT Frequency: Min 1X/week    Co-evaluation PT/OT/SLP Co-Evaluation/Treatment: Yes Reason for Co-Treatment: To address functional/ADL transfers;For patient/therapist safety PT goals addressed during session: Mobility/safety with mobility OT goals addressed during session: ADL's and self-care      AM-PAC OT "6 Clicks" Daily Activity     Outcome Measure Help from another person eating meals?: A Little Help from another person taking care of personal grooming?: A Little Help from another person toileting, which includes using toliet, bedpan, or urinal?: Total Help from another person bathing (including washing, rinsing, drying)?: A Lot Help from another person to put on and taking off regular upper body clothing?: A Little Help from another person to put on and taking off regular lower body clothing?: Total 6 Click Score: 13   End of Session Equipment Utilized During Treatment: Gait belt;Rolling walker (2 wheels);Other (comment) [facility]) Nurse Communication: Mobility status;Other (comment) (patients nausea)  Activity Tolerance: Patient limited by pain Patient left: in bed;with call bell/phone within reach;with bed alarm set  OT Visit Diagnosis: Pain;Muscle weakness (generalized) (M62.81)  Time:  -9147   Charges:  OT General Charges $OT Visit: 1 Visit OT Evaluation $OT Eval Low Complexity: 1 Low  Gonsalo Cuthbertson OTR/L, MS Acute Rehabilitation Department Office# [phone]   Selinda Flavin 06/28/2023, 1:25 PM

## 2023-06-28 NOTE — Plan of Care (Signed)

## 2023-06-28 NOTE — Progress Notes (Signed)
[facility] Progress Note  Patient: Darlene Beasley     IEP:[id number]  DOA: 06/05/2023   PCP: Estevan Oaks, NP       Brief hospital course: This is a 68 year old female with static breast cancer status post surgery, chemo, radiation with diffuse metastatic disease including her hip and pelvis who presented to the hospital with left hip pain nausea and vomiting. Orthopedic surgery was consulted and discussion with Dr. Darliss Cheney at [facility] was had who determined that she was not a surgical candidate for surgery and is high risk for hip fractures.  Subjective:  Pain is controlled as long as she does not move.  Oral intake is not But she is drinking a lot of liquids and eating a backorder of meals.  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 -At this point she is essentially bedbound - The patient has had discussions with oncology and palliative care and initially was going to go home with palliative care but has decided she would rather go to skilled nursing facility - therapy consults were ordered -Oncology would like to initiate Ibrance as outpatient - pain controlled at rest with current narcotic doses  Active Problems:  Acute resp failure - related to IV opiod during this hospital stay  - HTN - Amlodipine      Code Status: Limited: Do not attempt resuscitation (DNR) -DNR-LIMITED -Do Not Intubate/DNI  Total time on patient care: 35 min DVT prophylaxis:  Place and maintain sequential compression device Start: 06/13/23 1449     Objective:   Vitals:   06/27/23 1017 06/27/23 1410 06/27/23 2054 06/28/23 0605  BP: 121/71 113/68 109/68 127/69  Pulse: 100 (!) 101 96 96  Resp: 18  18 18   Temp: 98.7 F (37.1 C) 98.7 F (37.1 C) 97.9 F (36.6 C) 98 F (36.7 C)  TempSrc: Oral Oral Oral Oral  SpO2: 96% 94% 93% 95%  Weight:      Height:       Filed Weights   06/05/23 1644 06/06/23 0205   Weight: 66.7 kg 61.5 kg   Exam: General exam: Appears comfortable  HEENT: oral mucosa moist Respiratory system: Clear to auscultation.  Cardiovascular system: S1 & S2 heard  Gastrointestinal system: Abdomen soft, non-tender, nondistended. Normal bowel sounds   Extremities: No cyanosis, clubbing or edema Psychiatry:  Mood & affect appropriate.      CBC: Recent Labs  Lab 06/24/23 0710  WBC 6.0  HGB 11.1*  HCT 33.3*  MCV 93.5  PLT 79*   Basic Metabolic Panel: Recent Labs  Lab 06/22/23 0651 06/24/23 0538 06/24/23 1014 06/27/23 0646  NA 134* 137 130* 136  K 4.8 6.0* 4.1 4.4  CL 106 108 106 106  CO2 22 21* 21* 21*  GLUCOSE 86 86 98 94  BUN 18 21 20  29*  CREATININE 0.55 0.55 0.55 0.63  CALCIUM 7.9* 8.5* 8.0* 8.7*     Scheduled Meds:  amLODipine  5 mg Oral Daily   cyclobenzaprine  5 mg Oral TID   dexamethasone (DECADRON) injection  6 mg Intravenous Q24H   feeding supplement  1 Container Oral TID BM   letrozole  2.5 mg Oral Daily   nystatin  5 mL Oral QID   oxyCODONE  15 mg Oral Q12H   pantoprazole  40 mg Oral Daily   polyethylene glycol  17 g Oral BID   senna-docusate  2 tablet Oral BID    Imaging  and lab data personally reviewed   Author: Calvert Cantor  06/28/2023 1:22 PM  To contact [facility]>   Check the care team in [facility] and look for the attending/consulting [facility] provider listed  Log into [email or web address] and use [place]'s universal password   Go to> "[facility]"  and find provider  If you still have difficulty reaching the provider, please page the [facility] (Director on Call) for the Hospitalists listed on amion

## 2023-06-28 NOTE — Evaluation (Addendum)
Physical Therapy Re- Evaluation Patient Details Name: Darlene Beasley MRN: [id number] DOB: 04-17-1956 Today's Date: 06/28/2023  History of Present Illness  Patient is a 68 year old female who presented on 12/27 with increased pain. Patient was admitted with cancer of overlapping sites with mets to bones. Patient noted to be high risk for right proximal femoral pathologic fracture and has acetabular fracture on L side, and acute hypoxic respiratory failure. PMH: AKI, hypercalcemia, hyponatremia, breast cancer s/p L lumpectomy, R mastectomy,  Clinical Impression    The patient required much encouragement to  participate in mobility. Patient was evaluated  12/29 with recommendations to Dc home with transfers only as  patient was near her baseline due to pain.  Patient was provided instructions in [facility] mobility on steps , patient  Reports that a ramp is being built. Patient has remained in  the  hospital and now requesting  post acute rehab//SNF.  Requested clarification of WBS, per MD,  NWB on each leg which means  patient  should be scoot/slide transfers only due to fractures and impending  fractures of the  LE's.  Patient may benefit from a sliding  board if plans are To DC home vs SNF.  Continue PT to assist with safe disposition/DME. Appears that a new order is now NWB-LLE. Still discrepancy in WBS of  LE's  and need clarification finality.     If plan is discharge home, recommend the following: A lot of help with walking and/or transfers;A lot of help with bathing/dressing/bathroom;Assistance with cooking/housework;Assist for transportation;Help with stairs or ramp for entrance   Can travel by private vehicle        Equipment Recommendations None recommended by PT  Recommendations for Other Services       Functional Status Assessment Patient has had a recent decline in their functional status and/or demonstrates limited ability to make significant improvements in function in a reasonable and  predictable amount of time     Precautions / Restrictions Precautions Precautions: Fall Precaution Comments: high risk of fracture on RLE and pelvic fractures on L side, L femur impending fracture Restrictions Weight Bearing Restrictions Per Provider Order: Yes RLE Weight Bearing Per Provider Order: Non weight bearing LLE Weight Bearing Per Provider Order: Non weight bearing      Mobility  Bed Mobility   Bed Mobility: Supine to Sit, Sit to Supine     Supine to sit: HOB elevated, Min assist Sit to supine: Min assist   General bed mobility comments: patient  able to slide  RLE to bed edge,  assisted LLE to bed edge, able to push self up to  sitting, Assisted legs back onto bed, patient  requesting to go slowly (Simultaneous filing. User may not have seen previous data.)    Transfers Overall transfer level: Needs assistance Equipment used: Rolling walker (2 wheels) Transfers: Sit to/from Stand, Bed to chair/wheelchair/BSC Sit to Stand: +2 safety/equipment, From elevated surface, Mod assist   Step pivot transfers: Mod assist       General transfer comment: cues  to bear weight  on UE's and right  eg , NWB on the  left. Ptient  anxious but did perform to stand and pivot   to [facility] and back to bed,    Ambulation/Gait                  Stairs            Wheelchair Mobility     Tilt Bed  Modified Rankin (Stroke Patients Only)       Balance   Sitting-balance support: Bilateral upper extremity supported, Feet supported Sitting balance-Leahy Scale: Fair     Standing balance support: Bilateral upper extremity supported, Reliant on assistive device for balance Standing balance-Leahy Scale: Poor                               Pertinent Vitals/Pain Pain Assessment Faces Pain Scale: Hurts little more Pain Location: LLE Pain Descriptors / Indicators: Guarding Pain Intervention(s): Monitored during session, Limited activity within patient's  tolerance    Home Living Family/patient expects to be discharged to:: Private residence Living Arrangements: Spouse/significant other Available Help at Discharge: Family;Available PRN/intermittently Type of Home: House Home Access: Stairs to enter   Entrance Stairs-Number of Steps: 2   Home Layout: Able to live on main level with bedroom/bathroom Home Equipment: Agricultural consultant (2 wheels);Cane - single point;Wheelchair - manual Additional Comments: living room requires 2 steps. patient reported today that they are in the process of building a ramp    Prior Function Prior Level of Function : Needs assist             Mobility Comments: only transfers to w/c at this time due to pain, assist from spouse for transfers and for steps with w/c ADLs Comments: has been using a "pot" like bed pan type situation for toileting     Extremity/Trunk Assessment   Upper Extremity Assessment Upper Extremity Assessment: Overall WFL for tasks assessed    Lower Extremity Assessment Lower Extremity Assessment: LLE deficits/detail;RLE deficits/detail RLE Deficits / Details: able to move the theleg laterally on the bed to right LLE Deficits / Details: moves the leg with use of UE's  laterally    Cervical / Trunk Assessment Cervical / Trunk Assessment: Kyphotic  Communication   Communication Communication: No apparent difficulties  Cognition Arousal: Alert Behavior During Therapy: WFL for tasks assessed/performed, Anxious Overall Cognitive Status: Within Functional Limits for tasks assessed                                 General Comments: patient is motivated to participate in session.        General Comments      Exercises     Assessment/Plan    PT Assessment Patient needs continued PT services  PT Problem List Decreased mobility;Decreased activity tolerance;Decreased strength;Pain;Decreased knowledge of use of DME       PT Treatment Interventions DME  instruction;Therapeutic activities;Patient/family education;Functional mobility training    PT Goals (Current goals can be found in the Care Plan section)  Acute Rehab PT Goals Patient Stated Goal: to go slow and get sronger PT Goal Formulation: With patient Time For Goal Achievement: 07/12/23 Potential to Achieve Goals: Poor    Frequency Min 1X/week     Co-evaluation PT/OT/SLP Co-Evaluation/Treatment: Yes Reason for Co-Treatment: To address functional/ADL transfers;For patient/therapist safety PT goals addressed during session: Mobility/safety with mobility OT goals addressed during session: ADL's and self-care       AM-PAC PT "6 Clicks" Mobility  Outcome Measure Help needed turning from your back to your side while in a flat bed without using bedrails?: A Lot Help needed moving from lying on your back to sitting on the side of a flat bed without using bedrails?: A Lot Help needed moving to and from a bed to a chair (including  a wheelchair)?: Total Help needed standing up from a chair using your arms (e.g., wheelchair or bedside chair)?: Total Help needed to walk in hospital room?: Total Help needed climbing 3-5 steps with a railing? : Total 6 Click Score: 8    End of Session Equipment Utilized During Treatment: Gait belt Activity Tolerance: Patient limited by pain Patient left: in bed;with call bell/phone within reach;with bed alarm set Nurse Communication: Mobility status PT Visit Diagnosis: Other abnormalities of gait and mobility (R26.89);Pain Pain - Right/Left: Left Pain - part of body: Leg    Time: 1610-9604 PT Time Calculation (min) (ACUTE ONLY): 33 min   Charges:   PT Evaluation $PT Eval Low Complexity: 1 Low   PT General Charges $$ ACUTE PT VISIT: 1 Visit         Blanchard Kelch PT Acute Rehabilitation Services Office [phone] Weekend [phone]   [name] 06/28/2023, 1:20 PM

## 2023-06-29 ENCOUNTER — Inpatient Hospital Stay (HOSPITAL_COMMUNITY): Payer: 59

## 2023-06-29 DIAGNOSIS — M25552 Pain in left hip: Secondary | ICD-10-CM | POA: Diagnosis not present

## 2023-06-29 DIAGNOSIS — G8929 Other chronic pain: Secondary | ICD-10-CM | POA: Diagnosis not present

## 2023-06-29 MED ORDER — BISACODYL 10 MG RE SUPP
10.0000 mg | Freq: Once | RECTAL | Status: AC
  Administered 2023-06-29: 10 mg via RECTAL
  Filled 2023-06-29: qty 1

## 2023-06-29 NOTE — Plan of Care (Signed)
  Problem: Education: Goal: Knowledge of General Education information will improve Description: Including pain rating scale, medication(s)/side effects and non-pharmacologic comfort measures Outcome: Progressing   Problem: Health Behavior/Discharge Planning: Goal: Ability to manage health-related needs will improve Outcome: Progressing   Problem: Clinical Measurements: Goal: Ability to maintain clinical measurements within normal limits will improve Outcome: Progressing Goal: Diagnostic test results will improve Outcome: Progressing   Problem: Nutrition: Goal: Adequate nutrition will be maintained Outcome: Progressing   Problem: Elimination: Goal: Will not experience complications related to bowel motility Outcome: Progressing Goal: Will not experience complications related to urinary retention Outcome: Progressing   Problem: Pain Management: Goal: General experience of comfort will improve Outcome: Progressing   Problem: Safety: Goal: Ability to remain free from injury will improve Outcome: Progressing   Problem: Skin Integrity: Goal: Risk for impaired skin integrity will decrease Outcome: Progressing

## 2023-06-29 NOTE — Progress Notes (Addendum)
Darlene Beasley was given the suppository, then needed to be disimpacted because she was unable to push out the stool. A large amount was removed, soft brown stool. Darlene Beasley states she can't feel it to push out. She became exhausted and I was unable to get it all out. Darlene Beasley states she feels too full to eat.

## 2023-06-29 NOTE — Progress Notes (Signed)
[facility] Progress Note  Patient: Darlene Beasley     OZH:[id number]  DOA: 06/05/2023   PCP: Estevan Oaks, NP       Brief hospital course: This is a 68 year old female with static breast cancer status post surgery, chemo, radiation with diffuse metastatic disease including her hip and pelvis who presented to the hospital with left hip pain nausea and vomiting. Orthopedic surgery was consulted and discussion with Dr. Darliss Cheney at [facility] was had who determined that she was not a surgical candidate for surgery and is high risk for hip fractures.  Subjective:  Abdomen is bloated today and she is asking for suppository.  Vomited yesterday evening.  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 -Numerous bone mets noted throughout the body including skull -She states she completed radiation to bilateral hips in early January - The patient has had discussions with oncology and palliative care and initially was going to go home with palliative care but has decided she would rather go to skilled nursing facility - therapy consults were ordered -She is a DNR -Oncology would like to initiate Ibrance as outpatient -She has more pain in her left hip than the right-pain controlled at rest with current narcotic doses  Active Problems:  Severe abdominal distention today - Obtain abdominal x-ray  Acute resp failure - related to IV opiod during this hospital stay  HTN - Amlodipine   Chronically incontinent of stool    Code Status: Limited: Do not attempt resuscitation (DNR) -DNR-LIMITED -Do Not Intubate/DNI  Total time on patient care: 35 min DVT prophylaxis:  Place and maintain sequential compression device Start: 06/13/23 1449     Objective:   Vitals:   06/28/23 2122 06/29/23 0530 06/29/23 1103 06/29/23 1337  BP: 119/66 118/68 124/76 130/72  Pulse: 91 93  97  Resp: 16 14  16   Temp: 98.2 F (36.8  C) 98.3 F (36.8 C)  98.3 F (36.8 C)  TempSrc: Oral Oral  Oral  SpO2: 94% 95%  96%  Weight:      Height:       Filed Weights   06/05/23 1644 06/06/23 0205  Weight: 66.7 kg 61.5 kg   Exam: General exam: Appears comfortable  HEENT: oral mucosa moist Respiratory system: Clear to auscultation.  Cardiovascular system: S1 & S2 heard  Gastrointestinal system: Abdomen soft, distended, tight, tympanic with high-pitched bowel sounds   Extremities: No cyanosis, clubbing or edema Psychiatry:  Mood & affect appropriate.      CBC: Recent Labs  Lab 06/24/23 0710  WBC 6.0  HGB 11.1*  HCT 33.3*  MCV 93.5  PLT 79*   Basic Metabolic Panel: Recent Labs  Lab 06/24/23 0538 06/24/23 1014 06/27/23 0646  NA 137 130* 136  K 6.0* 4.1 4.4  CL 108 106 106  CO2 21* 21* 21*  GLUCOSE 86 98 94  BUN 21 20 29*  CREATININE 0.55 0.55 0.63  CALCIUM 8.5* 8.0* 8.7*     Scheduled Meds:  amLODipine  5 mg Oral Daily   cyclobenzaprine  5 mg Oral TID   dexamethasone (DECADRON) injection  6 mg Intravenous Q24H   feeding supplement  1 Container Oral TID BM   letrozole  2.5 mg Oral Daily   mirtazapine  7.5 mg Oral QHS   oxyCODONE  15 mg Oral Q12H   pantoprazole  40 mg Oral Daily   polyethylene glycol  17 g Oral BID  senna-docusate  2 tablet Oral BID    Imaging and lab data personally reviewed   Author: Calvert Cantor  06/29/2023 2:14 PM  To contact [facility]>   Check the care team in [facility] and look for the attending/consulting [facility] provider listed  Log into [email or web address] and use [employer]'s universal password   Go to> "[facility]"  and find provider  If you still have difficulty reaching the provider, please page the [facility] (Director on Call) for the Hospitalists listed on amion

## 2023-06-29 NOTE — Plan of Care (Signed)

## 2023-06-30 DIAGNOSIS — M25552 Pain in left hip: Secondary | ICD-10-CM | POA: Diagnosis not present

## 2023-06-30 DIAGNOSIS — G8929 Other chronic pain: Secondary | ICD-10-CM | POA: Diagnosis not present

## 2023-06-30 MED ORDER — SODIUM CHLORIDE 0.9 % IV SOLN
INTRAVENOUS | Status: DC
Start: 2023-06-30 — End: 2023-07-03

## 2023-06-30 NOTE — Plan of Care (Signed)

## 2023-06-30 NOTE — Plan of Care (Signed)
  Problem: Coping: Goal: Level of anxiety will decrease Outcome: Progressing   Problem: Elimination: Goal: Will not experience complications related to urinary retention Outcome: Progressing   Problem: Pain Management: Goal: General experience of comfort will improve Outcome: Progressing   Problem: Safety: Goal: Ability to remain free from injury will improve Outcome: Progressing   Problem: Skin Integrity: Goal: Risk for impaired skin integrity will decrease Outcome: Progressing

## 2023-06-30 NOTE — Progress Notes (Signed)
PT Cancellation Note  Patient Details Name: Darlene Beasley MRN: [id number] DOB: [birth year]   Cancelled Treatment:    Reason Eval/Treat Not Completed: Pain limiting ability to participate, abdominal distension, planning treatment for that. PT goals limited with  multiple bony lytic lesions in pelvis and femurs and impending fractures. Will follow. Blanchard Kelch PT Acute Rehabilitation Services Office [phone] Weekend [phone]     [name] 06/30/2023, 11:29 AM

## 2023-06-30 NOTE — Plan of Care (Signed)
  Problem: Education: Goal: Knowledge of General Education information will improve Description: Including pain rating scale, medication(s)/side effects and non-pharmacologic comfort measures Outcome: Progressing   Problem: Clinical Measurements: Goal: Will remain free from infection Outcome: Progressing Goal: Respiratory complications will improve Outcome: Progressing Goal: Cardiovascular complication will be avoided Outcome: Progressing   Problem: Elimination: Goal: Will not experience complications related to urinary retention Outcome: Progressing   Problem: Skin Integrity: Goal: Risk for impaired skin integrity will decrease Outcome: Progressing

## 2023-06-30 NOTE — Progress Notes (Addendum)
[facility] Progress Note  Patient: Darlene Beasley     NWG:[id number]  DOA: 06/05/2023   PCP: Estevan Oaks, NP       Brief hospital course: This is a 68 year old female with static breast cancer status post surgery, chemo, radiation with diffuse metastatic disease including her hip and pelvis who presented to the hospital with left hip pain, nausea and vomiting. Orthopedic surgery was consulted and discussion with Dr. Darliss Cheney at [facility] was had who determined that she was not a surgical candidate for surgery and is high risk for hip fractures.  Subjective:  Not able to eat today his abdomen feels tight.  She states that she did not urinate most of the day yesterday and through the night.  She has had a small amount of urine output this morning  Assessment and Plan: Principal Problem:  Extensively metastatic breast cancer -Numerous bone mets noted throughout the body including skull -She states she completed radiation to bilateral hips in early January - The patient has had discussions with oncology and palliative care and initially was going to go home with palliative care but has decided she would rather go to skilled nursing facility - therapy consults were ordered -She is a DNR -Oncology would like to initiate Ibrance as outpatient -She has more pain in her left hip than the right-pain controlled at rest with current narcotic doses   Active Problems:  Severe abdominal distention  - 1/19 - Imaging reveals a colonic ileus -Has had on and off vomiting during the hospital stay - will attempt to evacuate bowel but it could be that she is developing Ogilvie's syndrome from being mostly bed bound and on narcotics -Soapsuds enema ordered -Continue MiraLAX -GI assistance also requested -IV fluids started as oral intake is poor  Acute resp failure - related to IV opiods during this hospital stay  Thrombocytopenia  - present since Dec 2024  HTN - Amlodipine    Chronically incontinent of stool    Code Status: Limited: Do not attempt resuscitation (DNR) -DNR-LIMITED -Do Not Intubate/DNI  Total time on patient care: 35 min DVT prophylaxis:  Place and maintain sequential compression device Start: 06/13/23 1449     Objective:   Vitals:   06/29/23 1337 06/29/23 2041 06/30/23 0519 06/30/23 1349  BP: 130/72 115/72 117/80 122/76  Pulse: 97 96 96 97  Resp: 16 16 15 20   Temp: 98.3 F (36.8 C) (!) 97.5 F (36.4 C) 97.8 F (36.6 C) 97.8 F (36.6 C)  TempSrc: Oral Oral Oral Oral  SpO2: 96% 95% 95% 96%  Weight:      Height:       Filed Weights   06/05/23 1644 06/06/23 0205  Weight: 66.7 kg 61.5 kg   Exam: General exam: Appears comfortable  HEENT: oral mucosa moist Respiratory system: Clear to auscultation.  Cardiovascular system: S1 & S2 heard  Gastrointestinal system: Abdomen soft, distended, remains tight, tympanic with high-pitched bowel sounds   Extremities: No cyanosis, clubbing or edema Psychiatry:  Mood & affect appropriate.      CBC: Recent Labs  Lab 06/24/23 0710  WBC 6.0  HGB 11.1*  HCT 33.3*  MCV 93.5  PLT 79*   Basic Metabolic Panel: Recent Labs  Lab 06/24/23 0538 06/24/23 1014 06/27/23 0646  NA 137 130* 136  K 6.0* 4.1 4.4  CL 108 106 106  CO2 21* 21* 21*  GLUCOSE 86 98 94  BUN 21 20 29*  CREATININE 0.55 0.55 0.63  CALCIUM 8.5*  8.0* 8.7*     Scheduled Meds:  amLODipine  5 mg Oral Daily   cyclobenzaprine  5 mg Oral TID   feeding supplement  1 Container Oral TID BM   letrozole  2.5 mg Oral Daily   mirtazapine  7.5 mg Oral QHS   oxyCODONE  15 mg Oral Q12H   pantoprazole  40 mg Oral Daily   polyethylene glycol  17 g Oral BID   senna-docusate  2 tablet Oral BID    Imaging and lab data personally reviewed   Author: Calvert Cantor  06/30/2023 2:28 PM  To contact [facility]>   Check the care team in [facility] and look for the attending/consulting [facility] provider listed  Log into [email or web address] and  use [facility]'s universal password   Go to> "[facility]"  and find provider  If you still have difficulty reaching the provider, please page the [facility] (Director on Call) for the Hospitalists listed on amion

## 2023-07-01 DIAGNOSIS — G8929 Other chronic pain: Secondary | ICD-10-CM | POA: Diagnosis not present

## 2023-07-01 DIAGNOSIS — Z515 Encounter for palliative care: Secondary | ICD-10-CM | POA: Diagnosis not present

## 2023-07-01 DIAGNOSIS — Z7189 Other specified counseling: Secondary | ICD-10-CM | POA: Diagnosis not present

## 2023-07-01 DIAGNOSIS — M25552 Pain in left hip: Secondary | ICD-10-CM | POA: Diagnosis not present

## 2023-07-01 LAB — BASIC METABOLIC PANEL
Anion gap: 7 (ref 5–15)
BUN: 28 mg/dL — ABNORMAL HIGH (ref 8–23)
CO2: 18 mmol/L — ABNORMAL LOW (ref 22–32)
Calcium: 8.3 mg/dL — ABNORMAL LOW (ref 8.9–10.3)
Chloride: 107 mmol/L (ref 98–111)
Creatinine, Ser: 0.67 mg/dL (ref 0.44–1.00)
GFR, Estimated: >60 mL/min (ref >60)
Glucose, Bld: 114 mg/dL — ABNORMAL HIGH (ref 70–99)
Potassium: 3.8 mmol/L (ref 3.5–5.1)
Sodium: 132 mmol/L — ABNORMAL LOW (ref 135–145)

## 2023-07-01 MED ORDER — FLEET ENEMA RE ENEM
1.0000 | ENEMA | Freq: Once | RECTAL | Status: AC
  Administered 2023-07-01: 1 via RECTAL
  Filled 2023-07-01: qty 1

## 2023-07-01 NOTE — Consult Note (Signed)
Referring Provider: TH Primary Care Physician:  Estevan Oaks, NP Primary Gastroenterologist: Gentry Fitz  Reason for Consultation: Achilles  HPI: Darlene Beasley is a 68 y.o. female with past medical history of metastatic breast cancer with extensive bone metastases, history of chronic hip pain admitted to the hospital with hip pain, nausea and vomiting.  Not a surgical candidate per orthopedic.  Abdominal x-ray for evaluation of abdominal distention yesterday showed gaseous distention of the colon concerning for colonic ileus.  GI is consulted for further evaluation.  Patient seen and examined at bedside.  Discussed with family at bedside.  She had 3 good bowel movements yesterday after enema and MiraLAX.  She feels some improvement in abdominal discomfort.  Continues to have nausea and vomiting.  Denies seeing any blood in the stool or black stool.  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)   Pathological fracture, pelvis, initial encounter for fracture 06/14/2023   Personal history of chemotherapy    Personal history of radiation therapy     Past Surgical History:  Procedure Laterality Date   BREAST LUMPECTOMY Left 04/03/2018   BREAST LUMPECTOMY WITH RADIOACTIVE SEED AND SENTINEL LYMPH NODE BIOPSY Left 04/03/2018   Procedure: LEFT BREAST LUMPECTOMY WITH RADIOACTIVE SEED AND LEFT SENTINEL LYMPH NODE BIOPSY;  Surgeon: Claud Kelp, MD;  Location: [facility];  Service: General;  Laterality: Left;   IR IMAGING GUIDED PORT INSERTION  10/20/2017   IR US GUIDE VASC ACCESS LEFT  10/20/2017   MASTECTOMY Right 04/03/2018   MASTECTOMY MODIFIED RADICAL Right 04/03/2018    Procedure: MASTECTOMY MODIFIED RADICAL;  Surgeon: Claud Kelp, MD;  Location: [facility];  Service: General;  Laterality: Right;   PORT-A-CATH REMOVAL N/A 04/03/2018   Procedure: REMOVAL PORT-A-CATH;  Surgeon: Claud Kelp, MD;  Location: [facility];  Service: General;  Laterality: N/A;   TONSILLECTOMY Bilateral    TUBAL LIGATION      Prior to Admission medications   Medication Sig Start Date End Date Taking? Authorizing Provider  amLODipine (NORVASC) 5 MG tablet Take 1 tablet (5 mg total) by mouth daily. 02/19/23  Yes Pollyann Samples, NP  dexamethasone (DECADRON) 4 MG tablet Take 1 tablet (4 mg total) by mouth 2 (two) times daily with a meal. 06/02/23  Yes Pickenpack-Cousar, Arty Baumgartner, NP  prochlorperazine (COMPAZINE) 10 MG tablet Take 1 tablet (10 mg total) by mouth every 6 (six) hours as needed for nausea or vomiting. 05/30/23  Yes Pickenpack-Cousar, Arty Baumgartner, NP  tamoxifen (NOLVADEX) 20 MG tablet Take 1 tablet (20 mg total) by mouth daily. 02/19/23  Yes Pollyann Samples, NP  acetaminophen (TYLENOL) 325 MG tablet Take 2 tablets (650 mg total) by mouth every 6 (six) hours as needed for mild pain (pain score 1-3). 06/26/23   Tobey Grim, MD  cyclobenzaprine (FLEXERIL) 5 MG tablet Take 1 tablet (5 mg total) by mouth 3 (three) times daily. 06/26/23   Tobey Grim, MD  docusate sodium (COLACE) 50 MG capsule Take 50 mg by mouth 2 (two) times daily. Patient not taking: Reported on 06/07/2023    [provider]  feeding supplement (ENSURE ENLIVE / ENSURE PLUS)  LIQD Take 237 mLs by mouth 2 (two) times daily between meals. 06/26/23   Tobey Grim, MD  letrozole [facility]) 2.5 MG tablet Take 1 tablet (2.5 mg total) by mouth daily. 06/27/23   Tobey Grim, MD  ondansetron (ZOFRAN) 8 MG tablet Take 0.5 tablets (4 mg total) by mouth every 6 (six) hours as needed for nausea or vomiting. 06/26/23   Tobey Grim, MD  oxyCODONE (OXY IR/ROXICODONE) 5  MG immediate release tablet Take 1-2 tablets (5-10 mg total) by mouth every 6 (six) hours as needed for severe pain (pain score 7-10) or breakthrough pain. 06/26/23   Tobey Grim, MD  oxyCODONE ER [facility]) 9 MG C12A Take 9 mg by mouth every 12 (twelve) hours for 5 days. 06/26/23 07/01/23  Tobey Grim, MD  promethazine (PHENERGAN) 12.5 MG suppository Place 1 suppository (12.5 mg total) rectally every 6 (six) hours as needed for nausea (If not controlled with zofran). 06/26/23   Tobey Grim, MD  senna-docusate (SENOKOT-S) 8.6-50 MG tablet Take 1 tablet by mouth 2 (two) times daily as needed for mild constipation. 06/26/23   Tobey Grim, MD    Scheduled Meds:  amLODipine  5 mg Oral Daily   cyclobenzaprine  5 mg Oral TID   feeding supplement  1 Container Oral TID BM   letrozole  2.5 mg Oral Daily   mirtazapine  7.5 mg Oral QHS   oxyCODONE  15 mg Oral Q12H   pantoprazole  40 mg Oral Daily   polyethylene glycol  17 g Oral BID   senna-docusate  2 tablet Oral BID   Continuous Infusions:  sodium chloride 75 mL/hr at 06/30/23 1154   promethazine (PHENERGAN) injection (IM or IVPB) 150 mL/hr at 06/19/23 0309   PRN Meds:.acetaminophen, antiseptic oral rinse, morphine injection, ondansetron (ZOFRAN) IV, oxyCODONE, promethazine **OR** promethazine (PHENERGAN) injection (IM or IVPB) **OR** promethazine  Allergies as of 06/05/2023 - Review Complete 06/05/2023  Allergen Reaction Noted   Bee venom  10/14/2017   Tramadol Swelling 05/02/2023    Family History  Adopted: Yes  Problem Relation Age of Onset   Congestive Heart Failure Mother    Congestive Heart Failure Father     Social History   Socioeconomic History   Marital status: Significant Other    Spouse name: Not on file   Number of children: 1   Years of education: Not on file   Highest education level: Not on file  Occupational History   Not on file  Tobacco Use   Smoking status: Every Day    Current  packs/day: 0.50    Average packs/day: 0.5 packs/day for 51.0 years (25.5 ttl pk-yrs)    Types: Cigarettes   Smokeless tobacco: Never   Tobacco comments:    Working on quitting.  Vaping Use   Vaping status: Never Used  Substance and Sexual Activity   Alcohol use: Yes    Alcohol/week: 1.0 standard drink of alcohol    Types: 1 Shots of liquor per week    Comment: Rare   Drug use: Yes    Types: Marijuana    Comment: daily, since age 65    Sexual activity: Not Currently    Birth control/protection: Sponge, Surgical  Other Topics Concern   Not on file  Social History Narrative   Not on file   Social Drivers of Health   Financial Resource Strain: Not on file  Food Insecurity: Food Insecurity Present (05/28/2023)   Hunger Vital Sign  Worried About Programme researcher, broadcasting/film/video in the Last Year: Sometimes true    [employer] of Food in the Last Year: Never true  Transportation Needs: No Transportation Needs (05/28/2023)   PRAPARE - Administrator, Civil Service (Medical): No    Lack of Transportation (Non-Medical): No  Physical Activity: Sufficiently Active (09/16/2017)   Exercise Vital Sign    Days of Exercise per Week: 7 days    Minutes of Exercise per Session: 80 min  Stress: Stress Concern Present (09/16/2017)   [employer] of Occupational Health - Occupational Stress Questionnaire    Feeling of Stress : Very much  Social Connections: Socially Isolated (06/09/2023)   Social Connection and Isolation Panel [NHANES]    Frequency of Communication with Friends and Family: Once a week    Frequency of Social Gatherings with Friends and Family: Once a week    Attends Religious Services: Never    Database administrator or Organizations: No    Attends Banker Meetings: Never    Marital Status: Living with partner  Intimate Partner Violence: Not At Risk (05/28/2023)   Humiliation, Afraid, Rape, and Kick questionnaire    Fear of Current or Ex-Partner: No     Emotionally Abused: No    Physically Abused: No    Sexually Abused: No    Review of Systems: All negative except as stated above in HPI.  Physical Exam: Vital signs: Vitals:   06/30/23 2031 07/01/23 0545  BP: 115/73 114/69  Pulse: 98 100  Resp: 17 16  Temp: 98.5 F (36.9 C) 98.1 F (36.7 C)  SpO2: 95% 95%   Last BM Date : 06/30/23 General: Resting comfortably, not in acute distress, complaining of generalized abdominal pain Lungs: No visible respiratory distress Heart:  Regular rate and rhythm; no murmurs, clicks, rubs,  or gallops. Abdomen: Abdomen is distended, hypoactive bowel sounds, no focal tenderness, no peritoneal signs Somewhat anxious Alert and oriented x 3 Rectal:  Deferred  GI:  Lab Results: No results for input(s): "WBC", "HGB", "HCT", "PLT" in the last 72 hours. BMET Recent Labs    07/01/23 0541  NA 132*  K 3.8  CL 107  CO2 18*  GLUCOSE 114*  BUN 28*  CREATININE 0.67  CALCIUM 8.3*   LFT No results for input(s): "PROT", "ALBUMIN", "AST", "ALT", "ALKPHOS", "BILITOT", "BILIDIR", "IBILI" in the last 72 hours. PT/INR No results for input(s): "LABPROT", "INR" in the last 72 hours.   Studies/Results: DG Abd 1 View Result Date: 06/29/2023 CLINICAL DATA:  Abdominal distension. EXAM: ABDOMEN - 1 VIEW COMPARISON:  CT, 05/16/2023. FINDINGS: Gaseous distension of the colon. Stool and air within the rectum. No visualized small bowel dilation. Soft tissues are poorly defined, grossly unremarkable. IMPRESSION: Gaseous distension of the colon suspected to be a colonic ileus. No convincing bowel obstruction. Electronically Signed   By: Amie Portland M.D.   On: 06/29/2023 17:19    Impression/Plan: -Colonic ileus likely in setting of narcotic use and immobility. -Metastatic breast cancer with extensive bone mets  Recommendations --------------------------- -Recommend to continue daily fleets enema as well as MiraLAX twice a day. -Ambulate with help of physical  therapy if possible -Recheck magnesium.  Maintain electrolytes with potassium around 4 and magnesium around 2. -Repeat abdominal x-ray tomorrow. -Okay to continue diet as tolerated from GI standpoint -GI will follow    LOS: 25 days   Kathi Der  MD, FACP 07/01/2023, 8:59 AM  Contact #  [phone]

## 2023-07-01 NOTE — Plan of Care (Signed)

## 2023-07-01 NOTE — TOC Progression Note (Addendum)
Transition of Care [facility]) - Progression Note    Patient Details  Name: Darlene Beasley MRN: [id number] Date of Birth: 03-20-1956  [facility] CM/SW Contact  Beckie Busing, RN Phone Number:[phone]   07/01/2023, 12:21 PM  Clinical Narrative:    CM at bedside to have discussion with patient and significant other about disposition planning. CM has followed up with PT to determine if PT is able to work with patient for therapy. Per PT patient is really not going to be rehab candidate due to she may fracture in many areas at anytime. TOC will follow up with MD and patient for alternative disposition plan.      Barriers to Discharge: Continued Medical Work up  Expected Discharge Plan and Services         Expected Discharge Date: 06/26/23                                     Social Determinants of Health (SDOH) Interventions SDOH Screenings   Food Insecurity: Food Insecurity Present (05/28/2023)  Housing: High Risk (05/28/2023)  Transportation Needs: No Transportation Needs (05/28/2023)  Utilities: At Risk (05/28/2023)  Alcohol Screen: Low Risk  (05/10/2019)  Depression (PHQ2-9): Low Risk  (05/28/2023)  Physical Activity: Sufficiently Active (09/16/2017)  Social Connections: Socially Isolated (06/09/2023)  Stress: Stress Concern Present (09/16/2017)  Tobacco Use: High Risk (06/05/2023)    Readmission Risk Interventions    06/06/2023   11:23 PM  Readmission Risk Prevention Plan  Transportation Screening Complete  PCP or Specialist Appt within 5-7 Days Complete  Medication Review (RN CM) Referral to Pharmacy

## 2023-07-01 NOTE — Progress Notes (Signed)
Darlene Beasley   DOB:1955-12-09   EX#:[id number]   [id number]  Medical oncology follow-up note  Subjective: Patient is clinically stable, still has little appetite, not able to get out of bed.   Objective:  Vitals:   07/01/23 1426 07/01/23 2009  BP: 113/65 115/70  Pulse: 93 94  Resp: 16 16  Temp: 98 F (36.7 C) 98.3 F (36.8 C)  SpO2: 97% 95%    Body mass index is 23.27 kg/m. No intake or output data in the 24 hours ending 07/01/23 2210    Sclerae unicteric  Oropharynx clear  No peripheral adenopathy  Lungs clear -- no rales or rhonchi  Heart regular rate and rhythm  Abdomen benign  MSK no focal spinal tenderness, no peripheral edema  Neuro nonfocal   CBG (last 3)  No results for input(s): "GLUCAP" in the last 72 hours.   Labs:  Lab Results  Component Value Date   WBC 6.0 06/24/2023   HGB 11.1 (L) 06/24/2023   HCT 33.3 (L) 06/24/2023   MCV 93.5 06/24/2023   PLT 79 (L) 06/24/2023   NEUTROABS 1.9 06/11/2023     Urine Studies No results for input(s): "UHGB", "CRYS" in the last 72 hours.  Invalid input(s): "UACOL", "UAPR", "USPG", "UPH", "UTP", "UGL", "UKET", "UBIL", "UNIT", "UROB", "ULEU", "UEPI", "UWBC", "URBC", "UBAC", "CAST", "UCOM", "BILUA"  Basic Metabolic Panel: Recent Labs  Lab 06/27/23 0646 07/01/23 0541  NA 136 132*  K 4.4 3.8  CL 106 107  CO2 21* 18*  GLUCOSE 94 114*  BUN 29* 28*  CREATININE 0.63 0.67  CALCIUM 8.7* 8.3*   GFR Estimated Creatinine Clearance: 57.3 mL/min (by C-G formula based on SCr of 0.67 mg/dL). Liver Function Tests: No results for input(s): "AST", "ALT", "ALKPHOS", "BILITOT", "PROT", "ALBUMIN" in the last 168 hours.  No results for input(s): "LIPASE", "AMYLASE" in the last 168 hours. No results for input(s): "AMMONIA" in the last 168 hours. Coagulation profile No results for input(s): "INR", "PROTIME" in the last 168 hours.  CBC: No results for input(s): "WBC", "NEUTROABS", "HGB", "HCT", "MCV", "PLT" in the last  168 hours.  Cardiac Enzymes: No results for input(s): "CKTOTAL", "CKMB", "CKMBINDEX", "TROPONINI" in the last 168 hours. BNP: Invalid input(s): "POCBNP" CBG: No results for input(s): "GLUCAP" in the last 168 hours. D-Dimer No results for input(s): "DDIMER" in the last 72 hours. Hgb A1c No results for input(s): "HGBA1C" in the last 72 hours. Lipid Profile No results for input(s): "CHOL", "HDL", "LDLCALC", "TRIG", "CHOLHDL", "LDLDIRECT" in the last 72 hours. Thyroid function studies No results for input(s): "TSH", "T4TOTAL", "T3FREE", "THYROIDAB" in the last 72 hours.  Invalid input(s): "FREET3" Anemia work up No results for input(s): "VITAMINB12", "FOLATE", "FERRITIN", "TIBC", "IRON", "RETICCTPCT" in the last 72 hours. Microbiology No results found for this or any previous visit (from the past 240 hours).    Studies:  No results found.  Assessment: 68 y.o.  1.  Severe left hip pain secondary to bone metastasis and fracture  2.  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 Hypertension  Plan:  -She unfortunately not a candidate for surgery -Her main issue now is disposition.  She unfortunate does not qualify for rehab -We discussed home health care.  Due to her immobility and multiple cancer related symptoms, I anticipated she needs total care and medical support at home.  I discussed option of palliative care and home  hospice, and recommend her to consider home hospice.  She is not able to come to my office for follow-up, so would not be a candidate for Ibrance which require close monitoring.  To give her the best medical support at home, I think hospice is most appropriate.  Her life expectancy is less than 6 months and she qualifies for hospice care.  After lengthy discussion, she is open to hospice.   -If her overall condition improves, to the point  that she is able to ambulate and come to my office, she can certainly withdraw from hospice.   -If okay with hospice service, I would like her to continue letrozole. -I communicated with palliative care and Dr.Rizwan, will refer to [facility] [place]. -I spent a total of 50 minutes for her visit today, more than 50% time on face-to-face counseling.    Malachy Mood, MD 07/01/2023

## 2023-07-01 NOTE — Progress Notes (Signed)
Palliative Medicine Inpatient Follow Up Note HPI: 68 y.o. female  with past medical history of metastatic breast cancer admitted on 06/05/2023 with worsening left hip pain and nausea.  The PMT has been asked to get involved to further support goals of care conversations in the setting of metastatic disease.   Today's Discussion 07/01/2023  *Please note that this is a verbal dictation therefore any spelling or grammatical errors are due to the "Dragon Medical One" system interpretation.  Chart reviewed inclusive of vital signs, progress notes, laboratory results, and diagnostic images.   I met with Darlene Beasley and her significant other, Darlene Beasley at bedside this late afternoon. We discussed the concern(s) in the setting of advanced metastatic disease - Stg IV breast cancer. Darlene Beasley shares that she is a "realist" and understands the significance of her present health burden.   Created space and opportunity for patient to explore thoughts feelings and fears regarding current medical situation. Darlene Beasley acknowledges that she is nearing the end of life. She shares that she knows to date everything that could be done for her cancer has been done. She shares that she has lost many people who she cares about over the past few years. Darlene Beasley generally accepts death as a part of life. Her partner is reasonably having a difficult time with the situation she is in.   We reviewed the options from here and how Dr. Mosetta Putt plans to discuss consideration of non-chemo systemic therapy. I shared if after this discussion patient determines she would not desire this then hospice is a very reasonable consideration.  I described hospice as a service for patients who have a life expectancy of 6 months or less. The goal of hospice is the preservation of dignity and quality at the end phases of life. Under hospice care, the focus changes from curative to symptom relief.   Darlene Beasley is interested in meeting and speaking with hospice to understand  more about their services. A referral has been made to [facility].   Questions and concerns addressed/Palliative Support Provided.   Objective Assessment: Vital Signs Vitals:   07/01/23 0545 07/01/23 1426  BP: 114/69 113/65  Pulse: 100 93  Resp: 16 16  Temp: 98.1 F (36.7 C) 98 F (36.7 C)  SpO2: 95% 97%    Intake/Output Summary (Last 24 hours) at 07/01/2023 1547 Last data filed at 06/30/2023 1700 Gross per 24 hour  Intake 120 ml  Output 350 ml  Net -230 ml   Last Weight  Most recent update: 06/06/2023  2:05 AM    Weight  61.5 kg (135 lb 9.3 oz)            Gen: Elderly Caucasian F chronically ill appearing HEENT: moist mucous membranes CV: Regular rate and rhythm  PULM: On RA, breathing is even and nonlabored ABD: Distended EXT: No edema  Neuro: Alert and oriented x3   SUMMARY OF RECOMMENDATIONS   DNAR/DNI  Darlene Beasley is interesting in meeting with [facility] to better understand their services  Dr. Mosetta Putt has spoken to [facility] and she is leaning towards the pursuit of home hospice  PMT will continue to be involved and offer ongoing support  Billing based on MDM: High ______________________________________________________________________________________ Lamarr Lulas [facility] Palliative Medicine Team Team Cell Phone: [phone] Please utilize secure chat with additional questions, if there is no response within 30 minutes please call the above phone number  Palliative Medicine Team providers are available by phone from 7am to 7pm daily and  can be reached through the team cell phone.  Should this patient require assistance outside of these hours, please call the patient's attending physician.

## 2023-07-01 NOTE — Progress Notes (Signed)
   I received a referral to come speak with the pt and her significant other about how hospice could assist them. I was able to speak with [facility] and he was not at the hospital to meet at this time. He would like for me to call in the morning to see what the weather does and make the best time to plan and meet for discussion with them at [facility].   Norm Parcel RN [phone]

## 2023-07-01 NOTE — Progress Notes (Signed)
[facility] Progress Note  Patient: Darlene Beasley     JYN:[id number]  DOA: 06/05/2023   PCP: Estevan Oaks, NP       Brief hospital course: This is a 68 year old female with static breast cancer status post surgery, chemo, radiation with diffuse metastatic disease including her hip and pelvis who presented to the hospital with left hip pain, nausea and vomiting. Orthopedic surgery was consulted and there was a discussion with Dr. Darliss Cheney at [facility] was had who determined that she was not a surgical candidate and is high risk for hip fractures. I took over care of this patient on 1/18. On 1/19 it was noted she had a colonic ileus.   Subjective:  Did not eat breakfast today. She did have BMs after the soap suds enema yesterday. States abdomen still feels tight.   Assessment and Plan: Principal Problem:  Extensively metastatic breast cancer -Numerous bone mets noted throughout the body including skull and hips -She states she completed radiation to bilateral hips in early January - orthopedic surgery consulted and as mentioned, she is not a candidate for surgery  -She is a DNR -Oncology would like to initiate Ibrance as outpatient -She has more pain in her left hip than the right-pain controlled at rest with current narcotic doses- she can stand and pivot to the commode - oral intake seemed quite poor even before I recognized her colonic ileus- she is not ambulating much and is overall quite weak  Active Problems:  Colonic ileus - Severe abdominal distention  - 1/19 - Imaging revealed a colonic ileus- chronically incontinent of liquid stool -Has had on and off vomiting during the hospital stay - will attempt to evacuate bowel but it could be that she is developing Ogilvie's syndrome from being mostly bed bound and on narcotics -Soapsuds enema ordered -Continue MiraLAX -GI assistance also requested -IV fluids started as oral intake is poor - abdomen is less distended  today - can continue enemas until to see if they help the distension and help her feel more comfortable - will repeat abd Xray tomorrow  Acute resp failure - related to IV opiods during this hospital stay  Thrombocytopenia  - present since Dec 2024  HTN - Amlodipine     - Dispostion> - high risk for fracture- patient understands this- currently non weight bearing but if she decides on hospice, she could be weight bearing as tolerated- I have communicated with palliative care and oncology that I recommend home with hospice care or a hospice home and not SNF or home with only palliative care    Code Status: Limited: Do not attempt resuscitation (DNR) -DNR-LIMITED -Do Not Intubate/DNI  Total time on patient care: 35 min DVT prophylaxis:  Place and maintain sequential compression device Start: 06/13/23 1449     Objective:   Vitals:   06/30/23 1349 06/30/23 2031 07/01/23 0545 07/01/23 1426  BP: 122/76 115/73 114/69 113/65  Pulse: 97 98 100 93  Resp: 20 17 16 16   Temp: 97.8 F (36.6 C) 98.5 F (36.9 C) 98.1 F (36.7 C) 98 F (36.7 C)  TempSrc: Oral Oral Oral Oral  SpO2: 96% 95% 95% 97%  Weight:      Height:       Filed Weights   06/05/23 1644 06/06/23 0205  Weight: 66.7 kg 61.5 kg   Exam: General exam: Appears comfortable  HEENT: oral mucosa moist Respiratory system: Clear to auscultation.  Cardiovascular system: S1 & S2 heard  Gastrointestinal system:  Abdomen soft, moderately distended. Good bowel sounds- non tender Extremities: No cyanosis, clubbing or edema Psychiatry:  Mood & affect appropriate.      CBC: No results for input(s): "WBC", "NEUTROABS", "HGB", "HCT", "MCV", "PLT" in the last 168 hours.  Basic Metabolic Panel: Recent Labs  Lab 06/27/23 0646 07/01/23 0541  NA 136 132*  K 4.4 3.8  CL 106 107  CO2 21* 18*  GLUCOSE 94 114*  BUN 29* 28*  CREATININE 0.63 0.67  CALCIUM 8.7* 8.3*     Scheduled Meds:  amLODipine  5 mg Oral Daily    cyclobenzaprine  5 mg Oral TID   feeding supplement  1 Container Oral TID BM   letrozole  2.5 mg Oral Daily   mirtazapine  7.5 mg Oral QHS   oxyCODONE  15 mg Oral Q12H   pantoprazole  40 mg Oral Daily   polyethylene glycol  17 g Oral BID   senna-docusate  2 tablet Oral BID    Imaging and lab data personally reviewed   Author: Calvert Cantor  07/01/2023 3:56 PM  To contact [facility]>   Check the care team in [facility] and look for the attending/consulting [facility] provider listed  Log into [email or web address] and use [place]'s universal password   Go to> "[facility]"  and find provider  If you still have difficulty reaching the provider, please page the [facility] (Director on Call) for the Hospitalists listed on amion

## 2023-07-02 ENCOUNTER — Inpatient Hospital Stay (HOSPITAL_COMMUNITY): Payer: 59

## 2023-07-02 DIAGNOSIS — M25552 Pain in left hip: Secondary | ICD-10-CM | POA: Diagnosis not present

## 2023-07-02 DIAGNOSIS — Z515 Encounter for palliative care: Secondary | ICD-10-CM | POA: Diagnosis not present

## 2023-07-02 DIAGNOSIS — M84550A Pathological fracture in neoplastic disease, pelvis, initial encounter for fracture: Secondary | ICD-10-CM | POA: Diagnosis not present

## 2023-07-02 DIAGNOSIS — G8929 Other chronic pain: Secondary | ICD-10-CM | POA: Diagnosis not present

## 2023-07-02 DIAGNOSIS — C50412 Malignant neoplasm of upper-outer quadrant of left female breast: Secondary | ICD-10-CM | POA: Diagnosis not present

## 2023-07-02 DIAGNOSIS — C7951 Secondary malignant neoplasm of bone: Secondary | ICD-10-CM | POA: Diagnosis not present

## 2023-07-02 LAB — COMPREHENSIVE METABOLIC PANEL
ALT: 23 U/L (ref 0–44)
AST: 21 U/L (ref 15–41)
Albumin: 2.6 g/dL — ABNORMAL LOW (ref 3.5–5.0)
Alkaline Phosphatase: 55 U/L (ref 38–126)
Anion gap: 8 (ref 5–15)
BUN: 19 mg/dL (ref 8–23)
CO2: 15 mmol/L — ABNORMAL LOW (ref 22–32)
Calcium: 8 mg/dL — ABNORMAL LOW (ref 8.9–10.3)
Chloride: 113 mmol/L — ABNORMAL HIGH (ref 98–111)
Creatinine, Ser: 0.59 mg/dL (ref 0.44–1.00)
GFR, Estimated: >60 mL/min (ref >60)
Glucose, Bld: 125 mg/dL — ABNORMAL HIGH (ref 70–99)
Potassium: 3.4 mmol/L — ABNORMAL LOW (ref 3.5–5.1)
Sodium: 136 mmol/L (ref 135–145)
Total Bilirubin: 0.6 mg/dL (ref 0.0–1.2)
Total Protein: 4.7 g/dL — ABNORMAL LOW (ref 6.5–8.1)

## 2023-07-02 LAB — TSH: TSH: 5.092 u[IU]/mL — ABNORMAL HIGH (ref 0.350–4.500)

## 2023-07-02 LAB — MAGNESIUM: Magnesium: 2.2 mg/dL (ref 1.7–2.4)

## 2023-07-02 MED ORDER — PEG 3350-KCL-NA BICARB-NACL 420 G PO SOLR
4000.0000 mL | Freq: Once | ORAL | Status: AC
  Administered 2023-07-02: 4000 mL via ORAL
  Filled 2023-07-02: qty 4000

## 2023-07-02 NOTE — Progress Notes (Signed)
Occupational Therapy Treatment Patient Details Name: Darlene Beasley MRN: [id number] DOB: 06/15/1955 Today's Date: 07/02/2023   History of present illness     OT comments  Patient is NWB BLE with increased risks of fractures with all movement. Patient is able to roll at bed level with education on log rolling with supervision with use of bed rails. Patients significant other was present as well. Both frustrated with disease process and prognosis. Both report no questions on how to participate in ADLs at bed level at home at this time. OT to sign off. No further OT needs at this time.       If plan is discharge home, recommend the following:  Assistance with cooking/housework;Direct supervision/assist for medications management;Assist for transportation;Help with stairs or ramp for entrance;Direct supervision/assist for financial management;A lot of help with bathing/dressing/bathroom;A lot of help with walking and/or transfers         Precautions / Restrictions Precautions Precautions: Fall Precaution Comments: high risk of fracture on RLE and pelvic fractures on L side, L femur impending fracture Restrictions Weight Bearing Restrictions Per Provider Order: Yes RLE Weight Bearing Per Provider Order: Non weight bearing LLE Weight Bearing Per Provider Order: Non weight bearing       Mobility Bed Mobility Overal bed mobility: Needs Assistance   Rolling: Supervision         General bed mobility comments: with education on log rolling                 ADL either performed or assessed with clinical judgement   ADL Overall ADL's : Needs assistance/impaired       Toileting - Clothing Manipulation Details (indicate cue type and reason): patient was mod A for hygiene at bed level with increased loose stool impacting hygiene assist level. patient was able to roll with supervision to either side with use of bed rail and maintain positioning with no physical A. patients significant  other present during session.       General ADL Comments: patient was upset with diagnosis during session with both patient and significant other vocalizing frustrations. patient was educated on chaplin services. patient did not indicate wanting to speak with them at this time but made aware she can ask nursing to contact them wheneve. patient verbalized understanding.      Cognition Arousal: Alert Behavior During Therapy: WFL for tasks assessed/performed, Anxious Overall Cognitive Status: Within Functional Limits for tasks assessed         General Comments: significant other is present in room during session.patient is frustrated with diagnosis and disease process.                   Pertinent Vitals/ Pain       Pain Assessment Pain Assessment: Faces Faces Pain Scale: Hurts even more Pain Location: with rolling to R side in bed. Pain Intervention(s): Limited activity within patient's tolerance, Monitored during session          AM-PAC OT "6 Clicks" Daily Activity     Outcome Measure   Help from another person eating meals?: A Little Help from another person taking care of personal grooming?: A Little Help from another person toileting, which includes using toliet, bedpan, or urinal?: Total Help from another person bathing (including washing, rinsing, drying)?: A Lot Help from another person to put on and taking off regular upper body clothing?: A Little Help from another person to put on and taking off regular lower body clothing?: Total 6 Click Score:  13    End of Session    OT Visit Diagnosis: Pain;Muscle weakness (generalized) (M62.81)   Activity Tolerance Patient limited by pain   Patient Left in bed;with call bell/phone within reach;with bed alarm set;with family/visitor present   Nurse Communication Mobility status        Time: 2725-3664 OT Time Calculation (min): 26 min  Charges: OT General Charges $OT Visit: 1 Visit OT Treatments $Self  Care/Home Management : 23-37 mins  Rosalio Loud, MS Acute Rehabilitation Department Office# [phone]   Selinda Flavin 07/02/2023, 1:36 PM

## 2023-07-02 NOTE — Progress Notes (Signed)
PROGRESS NOTE    Darlene Beasley  PZW:[id number] DOB: 08/07/1955 DOA: 06/05/2023 PCP: Estevan Oaks, NP   Brief Narrative:  This is a 68 year old female with static breast cancer status post surgery, chemo, radiation with diffuse metastatic disease including her hip and pelvis who presented to the hospital with left hip pain, nausea and vomiting.  Orthopedic surgery was consulted and there was a discussion with Dr. Darliss Cheney at [facility] was had who determined that she was not a surgical candidate and is high risk for hip fractures. I took over care of this patient on 1/18. On 1/19 it was noted she had a colonic ileus which continues at this time with minimal improvement despite increase in bowel regimen.  Assessment & Plan:   Principal Problem:   Chronic left hip pain Active Problems:   Cancer of overlapping sites of right female breast (HCC)   Metastasis to bone (HCC)   Hyponatremia   Hypercalcemia   AKI (acute kidney injury) (HCC)   Nausea & vomiting   Neoplasm related pain   Acute hypoxic respiratory failure (HCC)   Bilateral leg edema   HTN (hypertension)   Pathological fracture, pelvis, initial encounter for fracture   Extensively metastatic breast cancer -Numerous bone mets noted throughout the body including skull and hips -She states she completed radiation to bilateral hips in early January - orthopedic surgery consulted, as mentioned per [facility], she is not a candidate for surgery -Oncology would like to initiate Ibrance as outpatient -She has more pain in her left hip than the right-pain controlled at rest with current narcotic doses- she can stand and pivot to the commode - oral intake remains poor in the setting of ileus    Acute colonic ileus - Severe abdominal distention  -initially noted 1/19 -Concern for Ogilvie's syndrome from being mostly bed bound and on narcotics -Soapsuds enema ordered as needed -GoLytely ordered x 1 today, continue MiraLAX and Senokot  as previously scheduled  -Discussed case with GI, no indication for endoscopic decompression at this time -IV fluids started as oral intake is poor -Repeat imaging shows moderate to severe dilation of the bowels   Acute respiratory failure, resolved - related to IV opiods during this hospital stay   Thrombocytopenia  - present since Dec 2024   HTN - Amlodipine   Goals of care -Patient continues to be high risk for fracture, unable to bear weight safely -Palliative care continues to follow, possible discharge home with home hospice pending resolution of ileus  DVT prophylaxis: Place and maintain sequential compression device Start: 06/13/23 1449 Code Status:   Code Status: Limited: Do not attempt resuscitation (DNR) -DNR-LIMITED -Do Not Intubate/DNI  Family Communication: At bedside  Status is: Inpatient  Dispo: The patient is from: Home              Anticipated d/c is to: Home              Anticipated d/c date is: Imminent pending ileus resolution              Patient currently not medically stable for discharge  Consultants:  Orthopedics, GI, palliative care  Procedures:  None none  Antimicrobials:  None  Subjective: No acute issues or events overnight, continues to complain of abdominal pain distention and nausea  Objective: Vitals:   07/01/23 0545 07/01/23 1426 07/01/23 2009 07/02/23 0530  BP: 114/69 113/65 115/70 109/67  Pulse: 100 93 94 95  Resp: 16 16 16 16   Temp: 98.1  F (36.7 C) 98 F (36.7 C) 98.3 F (36.8 C) 98.2 F (36.8 C)  TempSrc: Oral Oral Oral Oral  SpO2: 95% 97% 95% 95%  Weight:      Height:        Intake/Output Summary (Last 24 hours) at 07/02/2023 1337 Last data filed at 07/02/2023 0500 Gross per 24 hour  Intake --  Output 900 ml  Net -900 ml   Filed Weights   06/05/23 1644 06/06/23 0205  Weight: 66.7 kg 61.5 kg    Examination:  General:  Pleasantly resting in bed, No acute distress. HEENT:  Normocephalic atraumatic.  Sclerae  nonicteric, noninjected.  Extraocular movements intact bilaterally. Neck:  Without mass or deformity.  Trachea is midline. Lungs:  Clear to auscultate bilaterally without rhonchi, wheeze, or rales. Heart:  Regular rate and rhythm.  Without murmurs, rubs, or gallops. Abdomen: Soft, moderately distended, moderately tender. Extremities: Without cyanosis, clubbing, edema, or obvious deformity. Skin:  Warm and dry, no erythema.  Data Reviewed: I have personally reviewed following labs and imaging studies  CBC: No results for input(s): "WBC", "NEUTROABS", "HGB", "HCT", "MCV", "PLT" in the last 168 hours.  Basic Metabolic Panel: Recent Labs  Lab 06/27/23 0646 07/01/23 0541 07/02/23 0545  NA 136 132* 136  K 4.4 3.8 3.4*  CL 106 107 113*  CO2 21* 18* 15*  GLUCOSE 94 114* 125*  BUN 29* 28* 19  CREATININE 0.63 0.67 0.59  CALCIUM 8.7* 8.3* 8.0*  MG  --   --  2.2   GFR: Estimated Creatinine Clearance: 57.3 mL/min (by C-G formula based on SCr of 0.59 mg/dL). Liver Function Tests: Recent Labs  Lab 07/02/23 0545  AST 21  ALT 23  ALKPHOS 55  BILITOT 0.6  PROT 4.7*  ALBUMIN 2.6*   Thyroid Function Tests: Recent Labs    07/02/23 0545  TSH 5.092*   Radiology Studies: DG Abd 2 Views Result Date: 07/02/2023 CLINICAL DATA:  Abdominal distention EXAM: ABDOMEN - 2 VIEW COMPARISON:  06/29/2023 x-ray FINDINGS: Visualized thorax is without consolidation, pneumothorax or effusion. Slight left lung base scar or atelectasis. Bilateral axillary surgical clips. Calcified and tortuous aorta. As on prior there is gaseous dilatation and distention of bowel diffusely including small and large bowel. Large bowel in the right lower quadrant measures up to 13.6 cm and some mid abdominal small bowel loops up to 4.5 cm. There is minimal gas towards the rectum. Please correlate for ileus or obstruction. No obvious free air. Osteopenia and degenerative changes are noted. Stable sclerotic lesion involving the  proximal left femur. Please correlate with the prior CT scan of 06/13/2023. Please correlate for known breast cancer and metastatic disease. IMPRESSION: Persistent moderate to severe dilatation of bowel diffusely. Please correlate for obstruction or ileus. If needed additional CT imaging may be useful to further delineate etiology of this finding. Electronically Signed   By: Karen Kays M.D.   On: 07/02/2023 10:52      Scheduled Meds:  amLODipine  5 mg Oral Daily   cyclobenzaprine  5 mg Oral TID   feeding supplement  1 Container Oral TID BM   letrozole  2.5 mg Oral Daily   mirtazapine  7.5 mg Oral QHS   oxyCODONE  15 mg Oral Q12H   pantoprazole  40 mg Oral Daily   polyethylene glycol  17 g Oral BID   senna-docusate  2 tablet Oral BID   Continuous Infusions:  sodium chloride 75 mL/hr at 06/30/23 1154   promethazine (PHENERGAN)  injection (IM or IVPB) 150 mL/hr at 06/19/23 0309     LOS: 26 days   Time spent:  Azucena Fallen, DO [facility]  If 7PM-7AM, please contact night-coverage [email or web address]  07/02/2023, 1:37 PM

## 2023-07-02 NOTE — Progress Notes (Signed)
Daily Progress Note   Patient Name: Darlene Beasley       Date: 07/02/2023 DOB: 1955-08-04  Age: 68 y.o. MRN#: [id number] Attending Physician: Azucena Fallen, MD Primary Care Physician: Estevan Oaks, NP Admit Date: 06/05/2023  Reason for Consultation/Follow-up: Establishing goals of care  Patient Profile/HPI: 69 y.o. 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 Found to have pathologic fracture. Not a candidate for surgery. Palliative consulted for pain management and goals of care.    Subjective: Chart reviewed including labs, progress notes, imaging from this and previous encounters.  Patient has decided to proceed home with hospice. She was able to meet with hospice today.  Pain is well controlled. She is having bowel movements after starting bowel prep.    Review of Systems  All other systems reviewed and are negative.    Physical Exam Vitals and nursing note reviewed.  Constitutional:      Comments: frail  Neurological:     Mental Status: She is alert and oriented to person, place, and time.  Psychiatric:        Mood and Affect: Mood normal.             Vital Signs: BP 113/74 (BP Location: Left Arm)   Pulse 99   Temp 98.6 F (37 C) (Oral)   Resp 20   Ht 5\' 4"  (1.626 m)   Wt 61.5 kg   SpO2 93%   BMI 23.27 kg/m  SpO2: SpO2: 93 % O2 Device: O2 Device: Room Air O2 Flow Rate: O2 Flow Rate (L/min): 2 L/min  Intake/output summary:  Intake/Output Summary (Last 24 hours) at 07/02/2023 1542 Last data filed at 07/02/2023 0500 Gross per 24 hour  Intake --  Output 900 ml  Net -900 ml   LBM: Last BM Date : (S)  07/02/23 Baseline Weight: Weight: 66.7 kg Most recent weight: Weight: 61.5 kg       Palliative Assessment/Data: PPS: 50%      Patient Active Problem List   Diagnosis Date Noted   Pathological fracture, pelvis, initial encounter for fracture 06/14/2023   Chronic left hip pain 06/05/2023   Metastasis to bone (HCC) 06/05/2023   Hyponatremia  06/05/2023   Hypercalcemia 06/05/2023   AKI (acute kidney injury) (HCC) 06/05/2023   Nausea & vomiting 06/05/2023   Neoplasm related pain 06/05/2023   Acute hypoxic respiratory failure (HCC) 06/05/2023   Bilateral leg edema 06/05/2023   HTN (hypertension) 06/05/2023   Genetic testing 11/14/2017   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   Thank you for allowing the Palliative Medicine Team to assist in the care of this patient.  Total time: 25 minutes Visit includes:   Preparing to see the patient (e.g., review of tests) Obtaining and/or reviewing separately obtained history Performing a medically necessary appropriate examination and/or evaluation Counseling and educating the patient/family/caregiver Ordering medications, tests, or procedures Referring and communicating with other health care professionals (when not reported separately) Documenting clinical information in the electronic or other health record Independently interpreting results (not  reported separately) and communicating results to the patient/family/caregiver Care coordination (not reported separately) Clinical documentation  Ocie Bob, AGNP-C Palliative Medicine   Please contact Palliative Medicine Team phone at [phone] for questions and concerns.

## 2023-07-02 NOTE — Plan of Care (Signed)

## 2023-07-02 NOTE — Progress Notes (Signed)
[facility] Progress Note  Darlene Beasley 68 y.o. 1955/10/23  CC: Colonic ileus   Subjective: Patient seen and examined at bedside.  Family members at bedside.  Continues to have intermittent nausea vomiting and abdominal distention.  ROS : Afebrile, negative for chest pain   Objective: Vital signs in last 24 hours: Vitals:   07/01/23 2009 07/02/23 0530  BP: 115/70 109/67  Pulse: 94 95  Resp: 16 16  Temp: 98.3 F (36.8 C) 98.2 F (36.8 C)  SpO2: 95% 95%    Physical Exam: Resting comfortably, not in acute distress.  Abdominal exam showed moderately distended abdomen with decreased bowel sound, no peritoneal signs, nontender.  mood and affect normal. Alert and oriented x 3  Recent Labs    07/01/23 0541 07/02/23 0545  NA 132* 136  K 3.8 3.4*  CL 107 113*  CO2 18* 15*  GLUCOSE 114* 125*  BUN 28* 19  CREATININE 0.67 0.59  CALCIUM 8.3* 8.0*  MG  --  2.2   Recent Labs    07/02/23 0545  AST 21  ALT 23  ALKPHOS 55  BILITOT 0.6  PROT 4.7*  ALBUMIN 2.6*   No results for input(s): "WBC", "NEUTROABS", "HGB", "HCT", "MCV", "PLT" in the last 72 hours. No results for input(s): "LABPROT", "INR" in the last 72 hours.    Assessment/Plan: -Colonic ileus likely in setting of narcotic use and immobility. -Metastatic breast cancer with extensive bone mets   Recommendations --------------------------- -Repeat abdominal x-ray shows persistent ileus.  Started on bowel prep by primary team.  Agree with that.  Patient was advised to slowly drink bowel prep as it is not intended for colonoscopy prep but just for helping with the bowel movement and constipation.  She verbalized understanding. -Case with Dr. Natale Milch.  GI will sign off.  Call us back if needed  Kathi Der MD, FACP 07/02/2023, 1:15 PM  Contact #  [phone]

## 2023-07-02 NOTE — Progress Notes (Signed)
   I have reached out this morning to follow up on meeting with pt and Significant other.  0900am No answer left voicemail to return my call.   1030am No answer   Awaiting return call to set up best time to meet and discuss how hospice can assist with d/c plans    Plan to continue to make contact and set up meeting   Norm Parcel RN [phone]

## 2023-07-03 DIAGNOSIS — G8929 Other chronic pain: Secondary | ICD-10-CM | POA: Diagnosis not present

## 2023-07-03 DIAGNOSIS — M25552 Pain in left hip: Secondary | ICD-10-CM | POA: Diagnosis not present

## 2023-07-03 MED ORDER — SENNOSIDES-DOCUSATE SODIUM 8.6-50 MG PO TABS: 2.0000 | ORAL_TABLET | Freq: Two times a day (BID) | ORAL | 0 refills | Status: DC

## 2023-07-03 NOTE — TOC Transition Note (Signed)
Transition of Care [facility]) - Discharge Note   Patient Details  Name: Darlene Beasley MRN: [id number] Date of Birth: 1955/07/18  [facility]) CM/SW Contact:  Beckie Busing, RN Phone [phone]  07/03/2023, 3:11 PM   Clinical Narrative:    Patient with discharge orders.Plan for ambulance transport due to patient unable to transport via private vehicle. D/c packet is at nurses station. Transportation has been arranged via [facility]. Address verified with patient. There are no other TOC needs. TOC will sign off.    Final next level of care: Home w Hospice Care Barriers to Discharge: No Barriers Identified   Patient Goals and CMS Choice            Discharge Placement                       Discharge Plan and Services Additional resources added to the After Visit Summary for                  DME Arranged: N/A, Hospice Equipment Package A (per Hospice) DME Agency: [facility]                  Social Drivers of Health (SDOH) Interventions SDOH Screenings   Food Insecurity: Food Insecurity Present (05/28/2023)  Housing: High Risk (05/28/2023)  Transportation Needs: No Transportation Needs (05/28/2023)  Utilities: At Risk (05/28/2023)  Alcohol Screen: Low Risk  (05/10/2019)  Depression (PHQ2-9): Low Risk  (05/28/2023)  Physical Activity: Sufficiently Active (09/16/2017)  Social Connections: Socially Isolated (06/09/2023)  Stress: Stress Concern Present (09/16/2017)  Tobacco Use: High Risk (06/05/2023)     Readmission Risk Interventions    06/06/2023   11:23 PM  Readmission Risk Prevention Plan  Transportation Screening Complete  PCP or Specialist Appt within 5-7 Days Complete  Medication Review (RN CM) Referral to Pharmacy

## 2023-07-03 NOTE — Plan of Care (Signed)

## 2023-07-03 NOTE — Discharge Summary (Signed)
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   Orthopedic surgery was consulted and there was a discussion with Dr. Darliss Cheney at [facility] was had who determined that she was not a surgical candidate and is high risk for hip fractures. I took over care of this patient on 1/18. On 1/19 it was noted she had a colonic ileus which is now improving over the past 24 hours.  Given improvement in ileus increased bowel regimen tolerating p.o. well, pain well-controlled and home hospice set up and equipment delivered patient is otherwise stable and agreeable for discharge home today.  Discussed following up with hospice team at home for ongoing needs.  Discharge Diagnoses:  Principal Problem:   Chronic left hip pain Active Problems:   Cancer of overlapping sites of right female breast (HCC)   Metastasis to bone (HCC)   Hyponatremia   Hypercalcemia   AKI (acute kidney injury) (HCC)   Nausea & vomiting   Neoplasm related pain   Acute hypoxic respiratory failure (HCC)   Bilateral leg edema   HTN (hypertension)   Pathological fracture, pelvis, initial encounter for fracture    Discharge Instructions  Discharge Instructions     (HEART FAILURE PATIENTS) Call MD:  Anytime you have any of the following symptoms: 1) 3 pound weight gain in 24 hours or 5 pounds in 1 week 2) shortness of breath, with or without a dry hacking cough 3)  swelling in the hands, feet or stomach 4) if you have to sleep on extra pillows at night in order to breathe.   Complete by: As directed    Call MD for:  difficulty breathing, headache or visual disturbances   Complete by: As directed    Call MD for:  difficulty breathing, headache or visual disturbances   Complete by: As directed    Call MD for:  difficulty breathing, headache or visual disturbances   Complete by: As directed    Call MD for:  extreme fatigue   Complete by: As directed    Call MD for:  hives   Complete by: As directed    Call MD for:  persistant dizziness or light-headedness   Complete by: As directed    Call MD for:  persistant nausea and vomiting   Complete by: As directed    Call MD for:  persistant nausea and vomiting   Complete by: As directed    Call MD for:  persistant nausea and vomiting   Complete by: As directed    Call MD for:  severe uncontrolled pain   Complete by: As directed    Call MD for:  severe uncontrolled pain   Complete by: As directed    Call MD for:  severe uncontrolled pain   Complete by: As directed    Call MD for:  temperature >100.4   Complete by: As directed    Call MD for:  temperature >100.4   Complete by: As directed    Call MD for:  temperature >100.4  Complete by: As directed    Diet - low sodium heart healthy   Complete by: As directed    Diet general   Complete by: As directed    Discharge instructions   Complete by: As directed    Take the long-acting pain reliever oxycontin twice daily.  You have the short-acting oxycodone to use as needed every 6 hours or so.  If you have any nausea or vomiting, zofran is available to help with this.   Make sure to follow-up with Dr. Latanya Maudlin office as well as your primary care.   I have ordered home health PT/OT/nursing to help with assistance at home.   Increase activity slowly   Complete by: As directed    Increase activity slowly   Complete by: As directed    Increase activity  slowly   Complete by: As directed       Allergies as of 07/03/2023       Reactions   Bee Venom Anaphylaxis, Swelling   Tramadol Swelling, Other (See Comments)   Throat swelling        Medication List     STOP taking these medications    tamoxifen 20 MG tablet Commonly known as: NOLVADEX   Xtampza ER 9 MG C12a Generic drug: oxyCODONE ER       TAKE these medications    acetaminophen 325 MG tablet Commonly known as: TYLENOL Take 2 tablets (650 mg total) by mouth every 6 (six) hours as needed for mild pain (pain score 1-3).   amLODipine 5 MG tablet Commonly known as: NORVASC Take 1 tablet (5 mg total) by mouth daily.   cyclobenzaprine 5 MG tablet Commonly known as: FLEXERIL Take 1 tablet (5 mg total) by mouth 3 (three) times daily.   dexamethasone 4 MG tablet Commonly known as: DECADRON Take 1 tablet (4 mg total) by mouth 2 (two) times daily with a meal.   docusate sodium 50 MG capsule Commonly known as: COLACE Take 50 mg by mouth 2 (two) times daily.   feeding supplement Liqd Take 237 mLs by mouth 2 (two) times daily between meals.   letrozole 2.5 MG tablet Commonly known as: FEMARA Take 1 tablet (2.5 mg total) by mouth daily.   ondansetron 8 MG tablet Commonly known as: ZOFRAN Take 0.5 tablets (4 mg total) by mouth every 6 (six) hours as needed for nausea or vomiting. What changed: when to take this   oxyCODONE 5 MG immediate release tablet Commonly known as: Oxy IR/ROXICODONE Take 1-2 tablets (5-10 mg total) by mouth every 6 (six) hours as needed for severe pain (pain score 7-10) or breakthrough pain. What changed: when to take this   prochlorperazine 10 MG tablet Commonly known as: COMPAZINE Take 1 tablet (10 mg total) by mouth every 6 (six) hours as needed for nausea or vomiting.   promethazine 12.5 MG suppository Commonly known as: PHENERGAN Place 1 suppository (12.5 mg total) rectally every 6 (six) hours as needed for nausea (If not  controlled with zofran).   senna-docusate 8.6-50 MG tablet Commonly known as: Senokot-S Take 1 tablet by mouth 2 (two) times daily as needed for mild constipation.   senna-docusate 8.6-50 MG tablet Commonly known as: Senokot-S Take 2 tablets by mouth 2 (two) times daily.       ASK your doctor about these medications    Xtampza ER 9 MG C12a Generic drug: oxyCODONE ER Take 9 mg by mouth every 12 (twelve) hours for 5 days. Ask about: Should I  take this medication?               Durable Medical Equipment  (From admission, onward)           Start     Ordered   06/26/23 1140  DME Shower stool  Once        06/26/23 1139   06/26/23 1139  DME standard manual wheelchair with seat cushion  Once       Comments: Patient suffers from metastic cancer to her pelvis which impairs their ability to perform daily activities like bathing, dressing, and feeding in the home.  A walker will not resolve issue with performing activities of daily living. A wheelchair will allow patient to safely perform daily activities. Patient can safely propel the wheelchair in the home or has a caregiver who can provide assistance. Length of need Lifetime. Accessories: elevating leg rests (ELRs), wheel locks, extensions and anti-tippers.   06/26/23 1139   06/26/23 1139  DME Walker  Once       Question Answer Comment  Walker: With 5 Inch Wheels   Patient needs a walker to treat with the following condition Weakness      06/26/23 1139   06/25/23 1535  For home use only DME Hospital bed  Once       Comments: Therapeutic mattress  Question Answer Comment  Length of Need 12 Months   Patient has (list medical condition): metastatic cancer chronic hip pain   The above medical condition requires: Patient requires the ability to reposition frequently   Head must be elevated greater than: 30 degrees   Bed type Semi-electric      06/25/23 1536            Allergies  Allergen Reactions   Bee Venom  Anaphylaxis and Swelling   Tramadol Swelling and Other (See Comments)    Throat swelling    Consultations: GI, palliative care  Procedures/Studies: DG Abd 2 Views Result Date: 07/02/2023 CLINICAL DATA:  Abdominal distention EXAM: ABDOMEN - 2 VIEW COMPARISON:  06/29/2023 x-ray FINDINGS: Visualized thorax is without consolidation, pneumothorax or effusion. Slight left lung base scar or atelectasis. Bilateral axillary surgical clips. Calcified and tortuous aorta. As on prior there is gaseous dilatation and distention of bowel diffusely including small and large bowel. Large bowel in the right lower quadrant measures up to 13.6 cm and some mid abdominal small bowel loops up to 4.5 cm. There is minimal gas towards the rectum. Please correlate for ileus or obstruction. No obvious free air. Osteopenia and degenerative changes are noted. Stable sclerotic lesion involving the proximal left femur. Please correlate with the prior CT scan of 06/13/2023. Please correlate for known breast cancer and metastatic disease. IMPRESSION: Persistent moderate to severe dilatation of bowel diffusely. Please correlate for obstruction or ileus. If needed additional CT imaging may be useful to further delineate etiology of this finding. Electronically Signed   By: Karen Kays M.D.   On: 07/02/2023 10:52   DG Abd 1 View Result Date: 06/29/2023 CLINICAL DATA:  Abdominal distension. EXAM: ABDOMEN - 1 VIEW COMPARISON:  CT, 05/16/2023. FINDINGS: Gaseous distension of the colon. Stool and air within the rectum. No visualized small bowel dilation. Soft tissues are poorly defined, grossly unremarkable. IMPRESSION: Gaseous distension of the colon suspected to be a colonic ileus. No convincing bowel obstruction. Electronically Signed   By: Amie Portland M.D.   On: 06/29/2023 17:19   DG Bone Survey Met Result Date: 06/13/2023 CLINICAL DATA:  Metastasis  of unknown primary. Diffuse skeletal metastases. EXAM: METASTATIC BONE SURVEY  COMPARISON:  MRI lumbar spine 06/12/2023, CT chest, abdomen, and pelvis 05/16/2023; whole-body bone scan 05/07/2023 FINDINGS: Innumerable lytic lesions are again seen throughout the axial and appendicular skeleton. There are multiple lytic lesions within the skull, measuring up to 12 mm. 11 mm lytic lesion within the lateral left scapula just inferior to the glenoid. 28 mm lytic lesion within the lateral right scapula inferior to the glenoid. Dominant approximate 14 mm lytic lesion within the proximal lateral aspect of the right humeral diaphysis. 13 mm lytic lesion within the proximal left humeral metadiaphysis and 13 mm lytic lesion within the mid left humeral diaphysis. Moderate anterior height loss of the C7 vertebral body. Lucency predominantly within the posterosuperior aspect of the C7 vertebral body, likely pathologic involvement resulting in pathologic compression fracture. Numerous lytic lesions throughout the lumbar spine, better seen on prior CT and MRI. Dominant right L1 transverse process destructive lesion causing mild scalloping of the right L1 vertebral body, better seen on prior CT where a "dumbbell-shaped lesion" extends into the right aspect of the central canal and through the right neural foramen on prior CT. Grade 1 anterolisthesis of L3 on L4 and L4 on L5. The known marked left-greater-than-right hemipelvis destructive lytic lesions are better seen on today's CT. Bowel-gas obscures portions of the bilateral iliac bones. Sclerotic lesion within the left intertrochanteric region extending to the left greater trochanter measuring up to approximately 3.6 cm in craniocaudal dimension. Adjacent slightly more inferolateral proximal left femoral 14 mm lucent lesion. Additional scattered small lucent lesions within the bilateral femoral diaphyses and right lateral tibial plateau. Bilateral axillary surgical clips. IMPRESSION: 1. Innumerable lytic lesions throughout the axial and appendicular skeleton,  consistent with diffuse osseous metastatic disease. 2. Moderate anterior height loss of the C7 vertebral body, likely a pathologic compression fracture. 3. Dominant right L1 transverse process destructive lesion causing mild scalloping of the right L1 vertebral body, better seen on prior CT where a "dumbbell-shaped lesion" extends into the right aspect of the central canal and through the right neural foramen on prior CT. 4. Dominant left greater than right hemipelvis lytic lesions are better seen on today's CT. 5. Sclerotic lesion within the left intertrochanteric region extending to the left greater trochanter measuring up to approximately 3.6 cm in craniocaudal dimension. Adjacent slightly more inferolateral proximal left femoral 14 mm lucent lesion. Electronically Signed   By: Neita Garnet M.D.   On: 06/13/2023 13:53   CT HIP LEFT WO CONTRAST Result Date: 06/13/2023 CLINICAL DATA:  Metastatic breast cancer disease evaluation. Left hip pain. Musculoskeletal neoplasm. Assess treatment response. EXAM: CT OF THE LEFT HIP WITHOUT CONTRAST TECHNIQUE: Multidetector CT imaging of the left hip was performed according to the standard protocol. Multiplanar CT image reconstructions were also generated. RADIATION DOSE REDUCTION: This exam was performed according to the departmental dose-optimization program which includes automated exposure control, adjustment of the mA and/or kV according to patient size and/or use of iterative reconstruction technique. Multidetector CT imaging of the left hip was performed following the standard protocol without intravenous contrast. COMPARISON:  None Available. Pelvis and left hip radiographs 06/05/2023; CT right hip 06/12/2023; CT chest, abdomen, and pelvis 05/16/2023 FINDINGS: Bones/Joint/Cartilage There is diffuse decreased bone mineralization. There are again diffuse predominantly lytic skeletal metastases seen throughout the visualized lower lumbar spine, sacrum, ilium, ischium,  pubis portions of the pelvis and right greater than left proximal femurs. Right hip: The right hip was recently imaged  on 06/12/2023 CT. Dominant superior right acetabular lytic lesion measuring approximately 2.9 x 2.8 x 4.6 cm (transverse by AP by craniocaudal) is unchanged. There is again mild cortical step-off and a likely tiny pathologic fracture of the superomedial aspect of the right acetabulum at the inferior aspect of this lytic lesion (coronal series 6, image 69). This also again erodes through the medial right acetabular cortex. Dominant lesion within the right proximal femoral lesser trochanter measuring up to 3.7 cm (coronal series 6, image 75) is unchanged. Two dominant right femoral head lytic lesions with the larger, anterior lesion measuring up to 3.3 cm in craniocaudal dimension (coronal series 6, image 68) and marked thinning the inferior femoral head-neck junction with punctate complete erosion of the cortex (coronal series 6, image 66), unchanged from prior. Additional scattered lucent lesions throughout the right hemipelvis. Left hemipelvis: There are again innumerable lytic lesions within the left hemisacrum and left ilium, overall mildly to moderately progressed from 05/16/2023 CT. There are multiple worsened areas of cortical erosion, including within the superior and inferior aspects of the left hemisacrum, lateral left ilium, and anterior and lateral miss mid left ilium (axial series 2, image 46) where there is a new pathologic fracture with up to 4 mm posterior displacement of the lateral ileum with respect of the medial ilium. Additional more distal lateral left iliac wing new acute pathologic fracture with 3 mm posterior displacement of the lateral component (axial series 2, image 50). Left hip: A spiculated predominantly sclerotic lesion within the left femoral intertrochanteric region extending into the greater trochanter measures up to approximately 3.3 cm in craniocaudal dimension  and is very similar to 05/16/2023. Adjacent lytic lesion within the inferior left femoral intertrochanteric region measuring up to 19 mm in craniocaudal dimension moderately thinning the lateral left cortex (axial series 2, image 104 and coronal series 6, image 72) is mildly worsened from prior. There is mildly worsened erosion of the inferior medial left ilial cortex just superior to the left acetabulum (axial series 2, image 58 and coronal series 6, image 69). L4 vertebral body lytic lesion measuring up to 2.5 cm in craniocaudal dimension is similar to 05/16/2023 CT. Ligaments Suboptimally assessed by CT. Muscles and Tendons Minimal atrophy and fatty infiltration of the left greater than right gluteus minimus and medius muscles. Soft tissues Mild-to-moderate left and mild right superior left hip subcutaneous fat edema and swelling. No free fluid is seen within the pelvis. The uterus is present. Moderate to high-grade atherosclerotic calcifications. IMPRESSION: Compared to 05/16/2023: 1. Diffuse predominantly lytic skeletal metastases throughout the visualized lower lumbar spine, left-greater-than-right pelvis, and proximal femurs. 2. There are new, acute, mildly displaced pathologic fractures of the mid left ilium and distal lateral left iliac crest, as described above. 3. Dominant superior right acetabular lytic lesion with mild cortical step-off and likely tiny pathologic fracture of the superomedial aspect of the right acetabulum is unchanged from recent 06/12/2023 CT of the right hip. 4. Dominant right femoral head lytic lesions with marked thinning the inferior femoral head-neck junction. 5. Dominant spiculated predominantly sclerotic lesion within the left femoral intertrochanteric region extending into the greater trochanter is very similar to 05/16/2023. Adjacent lytic lesion within the inferior left femoral intertrochanteric region measuring up to 19 mm in craniocaudal dimension moderately thinning the  lateral left cortex is mildly worsened from prior. Electronically Signed   By: Neita Garnet M.D.   On: 06/13/2023 13:36   CT PELVIS WO CONTRAST Result Date: 06/13/2023 CLINICAL DATA:  Metastatic  breast cancer disease evaluation. Left hip pain. Musculoskeletal neoplasm. Assess treatment response. EXAM: CT OF THE LEFT HIP WITHOUT CONTRAST TECHNIQUE: Multidetector CT imaging of the left hip was performed according to the standard protocol. Multiplanar CT image reconstructions were also generated. RADIATION DOSE REDUCTION: This exam was performed according to the departmental dose-optimization program which includes automated exposure control, adjustment of the mA and/or kV according to patient size and/or use of iterative reconstruction technique. Multidetector CT imaging of the left hip was performed following the standard protocol without intravenous contrast. COMPARISON:  None Available. Pelvis and left hip radiographs 06/05/2023; CT right hip 06/12/2023; CT chest, abdomen, and pelvis 05/16/2023 FINDINGS: Bones/Joint/Cartilage There is diffuse decreased bone mineralization. There are again diffuse predominantly lytic skeletal metastases seen throughout the visualized lower lumbar spine, sacrum, ilium, ischium, pubis portions of the pelvis and right greater than left proximal femurs. Right hip: The right hip was recently imaged on 06/12/2023 CT. Dominant superior right acetabular lytic lesion measuring approximately 2.9 x 2.8 x 4.6 cm (transverse by AP by craniocaudal) is unchanged. There is again mild cortical step-off and a likely tiny pathologic fracture of the superomedial aspect of the right acetabulum at the inferior aspect of this lytic lesion (coronal series 6, image 69). This also again erodes through the medial right acetabular cortex. Dominant lesion within the right proximal femoral lesser trochanter measuring up to 3.7 cm (coronal series 6, image 75) is unchanged. Two dominant right femoral head lytic  lesions with the larger, anterior lesion measuring up to 3.3 cm in craniocaudal dimension (coronal series 6, image 68) and marked thinning the inferior femoral head-neck junction with punctate complete erosion of the cortex (coronal series 6, image 66), unchanged from prior. Additional scattered lucent lesions throughout the right hemipelvis. Left hemipelvis: There are again innumerable lytic lesions within the left hemisacrum and left ilium, overall mildly to moderately progressed from 05/16/2023 CT. There are multiple worsened areas of cortical erosion, including within the superior and inferior aspects of the left hemisacrum, lateral left ilium, and anterior and lateral miss mid left ilium (axial series 2, image 46) where there is a new pathologic fracture with up to 4 mm posterior displacement of the lateral ileum with respect of the medial ilium. Additional more distal lateral left iliac wing new acute pathologic fracture with 3 mm posterior displacement of the lateral component (axial series 2, image 50). Left hip: A spiculated predominantly sclerotic lesion within the left femoral intertrochanteric region extending into the greater trochanter measures up to approximately 3.3 cm in craniocaudal dimension and is very similar to 05/16/2023. Adjacent lytic lesion within the inferior left femoral intertrochanteric region measuring up to 19 mm in craniocaudal dimension moderately thinning the lateral left cortex (axial series 2, image 104 and coronal series 6, image 72) is mildly worsened from prior. There is mildly worsened erosion of the inferior medial left ilial cortex just superior to the left acetabulum (axial series 2, image 58 and coronal series 6, image 69). L4 vertebral body lytic lesion measuring up to 2.5 cm in craniocaudal dimension is similar to 05/16/2023 CT. Ligaments Suboptimally assessed by CT. Muscles and Tendons Minimal atrophy and fatty infiltration of the left greater than right gluteus  minimus and medius muscles. Soft tissues Mild-to-moderate left and mild right superior left hip subcutaneous fat edema and swelling. No free fluid is seen within the pelvis. The uterus is present. Moderate to high-grade atherosclerotic calcifications. IMPRESSION: Compared to 05/16/2023: 1. Diffuse predominantly lytic skeletal metastases throughout the visualized  lower lumbar spine, left-greater-than-right pelvis, and proximal femurs. 2. There are new, acute, mildly displaced pathologic fractures of the mid left ilium and distal lateral left iliac crest, as described above. 3. Dominant superior right acetabular lytic lesion with mild cortical step-off and likely tiny pathologic fracture of the superomedial aspect of the right acetabulum is unchanged from recent 06/12/2023 CT of the right hip. 4. Dominant right femoral head lytic lesions with marked thinning the inferior femoral head-neck junction. 5. Dominant spiculated predominantly sclerotic lesion within the left femoral intertrochanteric region extending into the greater trochanter is very similar to 05/16/2023. Adjacent lytic lesion within the inferior left femoral intertrochanteric region measuring up to 19 mm in craniocaudal dimension moderately thinning the lateral left cortex is mildly worsened from prior. Electronically Signed   By: Neita Garnet M.D.   On: 06/13/2023 13:36   MR Lumbar Spine W Wo Contrast Result Date: 06/13/2023 CLINICAL DATA:  Spine metastases, lumbar, monitor EXAM: MRI LUMBAR SPINE WITHOUT AND WITH CONTRAST TECHNIQUE: Multiplanar and multiecho pulse sequences of the lumbar spine were obtained without and with intravenous contrast. CONTRAST:  6mL GADAVIST GADOBUTROL 1 MMOL/ML IV SOLN COMPARISON:  None Available. FINDINGS: Segmentation:  Standard segmentation is assumed. Alignment:  No substantial sagittal subluxation. Vertebrae: Extensive osseous metastatic disease throughout the visualized thoracic, lumbar and sacrum. This includes  lesions at every level and lesions in the vertebral bodies and posterior elements. There is extraosseous extension of tumor including a large (5.2 x 3.7 cm) soft tissue mass which obliterates the right L1-L2 foramen and invades the right lateral canal at the L1 level. Resulting mild canal stenosis with leftward displacement of the cauda equina nerve roots. Additional index soft tissue mass involves the spinous process at L2 (2.2 x 1.9 cm on series 12, image 18). Additional mass involving the spinous process and lamina at L5 with extension into the posterior canal. This abuts the cauda equina nerve roots without significant canal stenosis. A lesion involving the left pedicle at L5-S1 encroaches on the foramen. Conus medullaris and cauda equina: Conus extends to the L1 level. Conus appears normal. Paraspinal and other soft tissues: No evidence of acute abnormality. Disc levels: Extensive bony and extraosseous metastatic lesions are detailed above. Superimposed degenerative change with mild to moderate canal stenosis at L3-L4 and moderate foraminal stenosis at L4-L5. IMPRESSION: Extensive osseous metastatic disease throughout the thoracic, lumbar and sacrum. This includes bulky extraosseous extension of tumor with obliteration of the right L1-L2 foramen and involvement of the canal at multiple levels, detailed above. No high-grade canal stenosis. Electronically Signed   By: Feliberto Harts M.D.   On: 06/13/2023 03:09   CT HIP RIGHT WO CONTRAST Result Date: 06/12/2023 CLINICAL DATA:  Metastatic breast cancer. Right hip pain. Surgical planning. EXAM: CT OF THE RIGHT HIP WITHOUT CONTRAST TECHNIQUE: Multidetector CT imaging of the right hip was performed according to the standard protocol. Multiplanar CT image reconstructions were also generated. RADIATION DOSE REDUCTION: This exam was performed according to the departmental dose-optimization program which includes automated exposure control, adjustment of the mA  and/or kV according to patient size and/or use of iterative reconstruction technique. COMPARISON:  Radiographs 06/05/2023, CT 05/16/2023 and bone scan 05/07/2023. FINDINGS: Bones/Joint/Cartilage Again demonstrated are widespread lytic metastases throughout the visualized right hemipelvis and proximal right femur. Overall mild progression is seen compared with the CT 4 weeks earlier. For example, a lesion in the right superior acetabulum measures up to 4.6 cm on coronal image 42/8 (previously 4.1 cm) and a lesion  of the right femoral lesser trochanter measures up to 3.6 cm on coronal image 46/8 (previously 3.2 cm). There are other prominent lytic lesions within the right femoral head and subtrochanteric region. Incidental imaging of the left pelvis demonstrates progressive disease within the left sacrum and the ilium. No definite pathologic fracture significant hip joint effusion demonstrated at this time. Ligaments Suboptimally assessed by CT. Muscles and Tendons No intramuscular mass, focal atrophy or hematoma is demonstrated. Soft tissues No evidence of pelvic or inguinal adenopathy. Mild nonspecific subcutaneous edema lateral to the right hip. Mild iliofemoral atherosclerosis. IMPRESSION: 1. Mild progression of widespread lytic metastases throughout the visualized right hemipelvis and proximal right femur compared with the CT 4 weeks earlier. No definite pathologic fracture or significant hip joint effusion demonstrated at this time. 2. No evidence of soft tissue mass or adenopathy. Electronically Signed   By: Carey Bullocks M.D.   On: 06/12/2023 15:52   DG HIP UNILAT WITH PELVIS 2-3 VIEWS LEFT Result Date: 06/05/2023 CLINICAL DATA:  Left hip pain, metastatic breast cancer with skeletal involvement EXAM: DG HIP (WITH OR WITHOUT PELVIS) 2-3V LEFT COMPARISON:  CT pelvis 05/16/2023 FINDINGS: Stable sclerotic lesion in the left greater trochanter. This likely represents a treated metastatic lesion. No fracture  the left proximal femur is identified. However, there is ill-defined abnormal lucency scattered throughout the bony pelvis most confluent in the left iliac bone and left sacrum compatible with lytic metastatic lesions and better shown on the CT from 05/16/2023. Pathologic pelvic fracture difficult to exclude given the heavy burden of osseous metastatic disease. There is lytic involvement in the right (contralateral) femoral head, femoral neck, and proximal metaphysis. IMPRESSION: 1. Heavy burden of lytic osseous metastatic disease in the bony pelvis and right (contralateral) femur. Pathologic pelvic fracture difficult to exclude given the heavy burden of osseous metastatic disease. 2. Stable sclerotic lesion in the left greater trochanter, likely a treated metastatic lesion. 3. No fracture of the left proximal femur is identified. Electronically Signed   By: Gaylyn Rong M.D.   On: 06/05/2023 18:11     Subjective: No acute issues or events overnight, marked improvement in bowel regimen with multiple bowel movements noted overnight and this morning.  Otherwise denies nausea vomiting constipation headache fevers chills or chest pain   Discharge Exam: Vitals:   07/03/23 1000 07/03/23 1208  BP: 115/73 116/83  Pulse:  97  Resp:  16  Temp:  98 F (36.7 C)  SpO2:  94%   Vitals:   07/02/23 2100 07/03/23 0601 07/03/23 1000 07/03/23 1208  BP: 115/73 121/88 115/73 116/83  Pulse: 100 97  97  Resp: 18 16  16   Temp: 98.6 F (37 C) 97.7 F (36.5 C)  98 F (36.7 C)  TempSrc: Oral Oral  Oral  SpO2: 96% 98%  94%  Weight:      Height:        General: Pt is alert, awake, not in acute distress Cardiovascular: RRR, S1/S2 +, no rubs, no gallops Respiratory: CTA bilaterally, no wheezing, no rhonchi Abdominal: Soft, NT, ND, bowel sounds + Extremities: no edema, no cyanosis    The results of significant diagnostics from this hospitalization (including imaging, microbiology, ancillary and  laboratory) are listed below for reference.     Microbiology: No results found for this or any previous visit (from the past 240 hours).   Labs: BNP (last 3 results) No results for input(s): "BNP" in the last 8760 hours. Basic Metabolic Panel: Recent Labs  Lab  06/27/23 0646 07/01/23 0541 07/02/23 0545  NA 136 132* 136  K 4.4 3.8 3.4*  CL 106 107 113*  CO2 21* 18* 15*  GLUCOSE 94 114* 125*  BUN 29* 28* 19  CREATININE 0.63 0.67 0.59  CALCIUM 8.7* 8.3* 8.0*  MG  --   --  2.2   Liver Function Tests: Recent Labs  Lab 07/02/23 0545  AST 21  ALT 23  ALKPHOS 55  BILITOT 0.6  PROT 4.7*  ALBUMIN 2.6*   No results for input(s): "LIPASE", "AMYLASE" in the last 168 hours. No results for input(s): "AMMONIA" in the last 168 hours. CBC: No results for input(s): "WBC", "NEUTROABS", "HGB", "HCT", "MCV", "PLT" in the last 168 hours. Cardiac Enzymes: No results for input(s): "CKTOTAL", "CKMB", "CKMBINDEX", "TROPONINI" in the last 168 hours. BNP: Invalid input(s): "POCBNP" CBG: No results for input(s): "GLUCAP" in the last 168 hours. D-Dimer No results for input(s): "DDIMER" in the last 72 hours. Hgb A1c No results for input(s): "HGBA1C" in the last 72 hours. Lipid Profile No results for input(s): "CHOL", "HDL", "LDLCALC", "TRIG", "CHOLHDL", "LDLDIRECT" in the last 72 hours. Thyroid function studies Recent Labs    07/02/23 0545  TSH 5.092*   Anemia work up No results for input(s): "VITAMINB12", "FOLATE", "FERRITIN", "TIBC", "IRON", "RETICCTPCT" in the last 72 hours. Urinalysis    Component Value Date/Time   COLORURINE YELLOW 06/05/2023 2154   APPEARANCEUR CLEAR 06/05/2023 2154   LABSPEC 1.006 06/05/2023 2154   PHURINE 5.0 06/05/2023 2154   GLUCOSEU NEGATIVE 06/05/2023 2154   HGBUR NEGATIVE 06/05/2023 2154   BILIRUBINUR NEGATIVE 06/05/2023 2154   BILIRUBINUR small 06/04/2013 1648   KETONESUR NEGATIVE 06/05/2023 2154   PROTEINUR NEGATIVE 06/05/2023 2154    UROBILINOGEN 0.2 06/04/2013 1648   NITRITE NEGATIVE 06/05/2023 2154   LEUKOCYTESUR NEGATIVE 06/05/2023 2154   Sepsis Labs No results for input(s): "WBC" in the last 168 hours.  Invalid input(s): "PROCALCITONIN", "LACTICIDVEN" Microbiology No results found for this or any previous visit (from the past 240 hours).   Time coordinating discharge: Over 30 minutes  SIGNED:   Azucena Fallen, DO [facility] 07/03/2023, 1:48 PM Pager   If 7PM-7AM, please contact night-coverage [email or web address]

## 2023-07-03 NOTE — Progress Notes (Signed)
Physical Therapy Discharge Patient Details Name: Darlene Beasley MRN: [id number] DOB: 1955/09/01 Today's Date: 07/03/2023 Time:  -     Patient discharged from PT services secondary to plans DC Home with Hospice, patient is  limited in mobility with bony mets. .  Please see latest therapy progress note for current level of functioning and progress toward goals.    Blanchard Kelch PT Acute Rehabilitation Services Office [phone] Weekend [phone]   GP     [name] 07/03/2023, 2:04 PM

## 2023-07-14 ENCOUNTER — Ambulatory Visit
Admit: 2023-07-14 | Discharge: 2023-07-14 | Disposition: A | Discharge status: Home / Self Care | Payer: 59 | Attending: Hematology | Admitting: Hematology

## 2023-07-14 NOTE — Progress Notes (Signed)
  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 (as documented in provider EOT note)  The patient was not available for call today. No voicemail option.  The patient is not scheduled for ongoing care with Dr. Mosetta Putt in medical oncology and has been placed on at-home hospice care. The patient was encouraged to call if she develops concerns or questions regarding radiation.    Ruel Favors, LPN

## 2023-07-18 ENCOUNTER — Encounter: Payer: Self-pay | Admitting: Hematology

## 2023-07-22 ENCOUNTER — Ambulatory Visit: Payer: 59 | Admitting: Hematology

## 2023-07-22 ENCOUNTER — Other Ambulatory Visit: Payer: 59

## 2023-08-20 ENCOUNTER — Other Ambulatory Visit: Payer: 59

## 2023-08-20 ENCOUNTER — Ambulatory Visit: Payer: 59 | Admitting: Hematology

## 2023-10-01 ENCOUNTER — Other Ambulatory Visit: Payer: Self-pay

## 2023-10-09 DEATH — deceased
